# Patient Record
Sex: Male | Born: 1970 | Race: Black or African American | Hispanic: No | Marital: Married
Health system: Southern US, Academic
[De-identification: ages and names within clinical notes are randomized; demographics above are authoritative.]

## PROBLEM LIST (undated history)

## (undated) ENCOUNTER — Encounter

## (undated) ENCOUNTER — Ambulatory Visit: Payer: MEDICARE

## (undated) ENCOUNTER — Ambulatory Visit: Attending: Pharmacist | Primary: Pharmacist

## (undated) ENCOUNTER — Ambulatory Visit

## (undated) ENCOUNTER — Telehealth

## (undated) ENCOUNTER — Ambulatory Visit: Payer: MEDICARE | Attending: Registered" | Primary: Registered"

## (undated) ENCOUNTER — Ambulatory Visit: Payer: MEDICARE | Attending: Orthopaedic Surgery | Primary: Orthopaedic Surgery

## (undated) ENCOUNTER — Ambulatory Visit: Payer: MEDICARE | Attending: Foot and Ankle Surgery | Primary: Foot and Ankle Surgery

## (undated) ENCOUNTER — Ambulatory Visit
Payer: MEDICARE | Attending: Rehabilitative and Restorative Service Providers" | Primary: Rehabilitative and Restorative Service Providers"

## (undated) ENCOUNTER — Encounter: Attending: Internal Medicine | Primary: Internal Medicine

## (undated) ENCOUNTER — Encounter
Attending: Student in an Organized Health Care Education/Training Program | Primary: Student in an Organized Health Care Education/Training Program

## (undated) ENCOUNTER — Other Ambulatory Visit

## (undated) ENCOUNTER — Inpatient Hospital Stay

## (undated) ENCOUNTER — Encounter
Attending: Pharmacist Clinician (PhC)/ Clinical Pharmacy Specialist | Primary: Pharmacist Clinician (PhC)/ Clinical Pharmacy Specialist

## (undated) ENCOUNTER — Ambulatory Visit: Payer: MEDICAID

## (undated) ENCOUNTER — Ambulatory Visit: Payer: MEDICARE | Attending: Emergency Medicine | Primary: Emergency Medicine

## (undated) ENCOUNTER — Telehealth: Attending: Internal Medicine | Primary: Internal Medicine

## (undated) ENCOUNTER — Ambulatory Visit
Payer: MEDICARE | Attending: Student in an Organized Health Care Education/Training Program | Primary: Student in an Organized Health Care Education/Training Program

## (undated) ENCOUNTER — Ambulatory Visit: Attending: Registered" | Primary: Registered"

## (undated) ENCOUNTER — Telehealth
Attending: Student in an Organized Health Care Education/Training Program | Primary: Student in an Organized Health Care Education/Training Program

## (undated) ENCOUNTER — Telehealth: Attending: Primary Care | Primary: Primary Care

## (undated) ENCOUNTER — Inpatient Hospital Stay: Payer: MEDICARE

## (undated) ENCOUNTER — Ambulatory Visit: Payer: MEDICARE | Attending: Physical Medicine & Rehabilitation | Primary: Physical Medicine & Rehabilitation

## (undated) ENCOUNTER — Telehealth: Attending: Hospitalist | Primary: Hospitalist

## (undated) ENCOUNTER — Encounter: Attending: Clinical | Primary: Clinical

## (undated) ENCOUNTER — Encounter: Attending: Gastroenterology | Primary: Gastroenterology

## (undated) DIAGNOSIS — G8929 Other chronic pain: Secondary | ICD-10-CM

## (undated) DIAGNOSIS — IMO0001 Reserved for inherently not codable concepts without codable children: Secondary | ICD-10-CM

## (undated) DIAGNOSIS — M542 Cervicalgia: Secondary | ICD-10-CM

## (undated) DIAGNOSIS — R51 Headache: Secondary | ICD-10-CM

## (undated) DIAGNOSIS — S6292XA Unspecified fracture of left wrist and hand, initial encounter for closed fracture: Secondary | ICD-10-CM

## (undated) DIAGNOSIS — R0602 Shortness of breath: Secondary | ICD-10-CM

## (undated) DIAGNOSIS — M5412 Radiculopathy, cervical region: Secondary | ICD-10-CM

## (undated) DIAGNOSIS — I10 Essential (primary) hypertension: Secondary | ICD-10-CM

## (undated) DIAGNOSIS — K219 Gastro-esophageal reflux disease without esophagitis: Secondary | ICD-10-CM

## (undated) DIAGNOSIS — R109 Unspecified abdominal pain: Secondary | ICD-10-CM

## (undated) DIAGNOSIS — K589 Irritable bowel syndrome without diarrhea: Secondary | ICD-10-CM

## (undated) DIAGNOSIS — K5090 Crohn's disease, unspecified, without complications: Secondary | ICD-10-CM

## (undated) HISTORY — PX: COLONOSCOPY: SHX174

## (undated) HISTORY — PX: NEVUS EXCISION: SHX2090

## (undated) HISTORY — PX: COLON SURGERY: SHX602

## (undated) HISTORY — DX: Unspecified fracture of left hand, initial encounter for closed fracture: S62.92XA

## (undated) HISTORY — PX: BOWEL RESECTION: SHX1257

---
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
## 2004-01-18 DIAGNOSIS — R197 Diarrhea, unspecified: Secondary | ICD-10-CM | POA: Insufficient documentation

## 2004-12-31 DIAGNOSIS — R112 Nausea with vomiting, unspecified: Secondary | ICD-10-CM | POA: Insufficient documentation

## 2004-12-31 DIAGNOSIS — R110 Nausea: Secondary | ICD-10-CM | POA: Insufficient documentation

## 2006-07-20 ENCOUNTER — Emergency Department (HOSPITAL_COMMUNITY): Admission: EM | Admit: 2006-07-20 | Discharge: 2006-07-21 | Payer: Self-pay | Admitting: Emergency Medicine

## 2006-09-29 ENCOUNTER — Emergency Department (HOSPITAL_COMMUNITY): Admission: EM | Admit: 2006-09-29 | Discharge: 2006-09-29 | Payer: Self-pay | Admitting: Emergency Medicine

## 2007-04-23 ENCOUNTER — Emergency Department (HOSPITAL_COMMUNITY): Admission: EM | Admit: 2007-04-23 | Discharge: 2007-04-23 | Payer: Self-pay | Admitting: Family Medicine

## 2007-06-25 ENCOUNTER — Emergency Department (HOSPITAL_COMMUNITY): Admission: EM | Admit: 2007-06-25 | Discharge: 2007-06-26 | Payer: Self-pay | Admitting: Emergency Medicine

## 2007-08-31 ENCOUNTER — Inpatient Hospital Stay (HOSPITAL_COMMUNITY): Admission: EM | Admit: 2007-08-31 | Discharge: 2007-09-15 | Payer: Self-pay | Admitting: Emergency Medicine

## 2007-10-11 ENCOUNTER — Emergency Department (HOSPITAL_COMMUNITY): Admission: EM | Admit: 2007-10-11 | Discharge: 2007-10-12 | Payer: Self-pay | Admitting: Emergency Medicine

## 2007-11-05 ENCOUNTER — Emergency Department (HOSPITAL_COMMUNITY): Admission: EM | Admit: 2007-11-05 | Discharge: 2007-11-05 | Payer: Self-pay | Admitting: Emergency Medicine

## 2007-11-16 ENCOUNTER — Emergency Department (HOSPITAL_COMMUNITY): Admission: EM | Admit: 2007-11-16 | Discharge: 2007-11-16 | Payer: Self-pay | Admitting: Emergency Medicine

## 2008-01-04 ENCOUNTER — Emergency Department (HOSPITAL_COMMUNITY): Admission: EM | Admit: 2008-01-04 | Discharge: 2008-01-04 | Payer: Self-pay | Admitting: Emergency Medicine

## 2008-01-20 ENCOUNTER — Emergency Department (HOSPITAL_COMMUNITY): Admission: EM | Admit: 2008-01-20 | Discharge: 2008-01-20 | Payer: Self-pay | Admitting: Emergency Medicine

## 2008-02-05 DIAGNOSIS — Z8719 Personal history of other diseases of the digestive system: Secondary | ICD-10-CM | POA: Insufficient documentation

## 2008-09-10 ENCOUNTER — Emergency Department: Payer: Self-pay | Admitting: Internal Medicine

## 2008-11-17 ENCOUNTER — Emergency Department: Payer: Self-pay | Admitting: Unknown Physician Specialty

## 2008-11-20 ENCOUNTER — Emergency Department: Payer: Self-pay | Admitting: Emergency Medicine

## 2008-12-16 ENCOUNTER — Emergency Department: Payer: Self-pay | Admitting: Emergency Medicine

## 2009-03-25 DIAGNOSIS — F1911 Other psychoactive substance abuse, in remission: Secondary | ICD-10-CM | POA: Insufficient documentation

## 2009-03-25 DIAGNOSIS — F1920 Other psychoactive substance dependence, uncomplicated: Secondary | ICD-10-CM | POA: Insufficient documentation

## 2009-06-21 ENCOUNTER — Emergency Department: Payer: Self-pay | Admitting: Internal Medicine

## 2009-06-25 ENCOUNTER — Emergency Department: Payer: Self-pay | Admitting: Emergency Medicine

## 2009-08-08 ENCOUNTER — Emergency Department: Payer: Self-pay | Admitting: Emergency Medicine

## 2010-02-18 ENCOUNTER — Emergency Department: Payer: Self-pay | Admitting: Emergency Medicine

## 2010-02-27 ENCOUNTER — Ambulatory Visit: Payer: Self-pay | Admitting: Sports Medicine

## 2010-05-22 DIAGNOSIS — K269 Duodenal ulcer, unspecified as acute or chronic, without hemorrhage or perforation: Secondary | ICD-10-CM | POA: Insufficient documentation

## 2010-06-12 NOTE — Consult Note (Signed)
NAME:  Benjamin Murillo, Benjamin Murillo NO.:  [id number]   MEDICAL RECORD NO.:  [id number]          PATIENT TYPE:  EMS   LOCATION:  ED                           FACILITY:  [facility]   PHYSICIAN:  Stark Klein, MD       DATE OF BIRTH:  March 23, 1970   DATE OF CONSULTATION:  08/31/2007  DATE OF DISCHARGE:                                 CONSULTATION   REFERRING PHYSICIAN:  Dr. Tomi Bamberger, in the emergency department.   CHIEF COMPLAINT:  Left upper quadrant abscess.   HISTORY OF PRESENT ILLNESS:  Benjamin Murillo is a 40 year old male with a 17-  year history of Crohn's disease who was admitted to [facility] for  around a month, being discharged around 2 weeks ago.  At that time he  had an ileocecal resection complicated by an anastomotic leak requiring  multiple drains.  At this point, he is down to 1 drain, but presents  with 4-5 days of abdominal pain, anorexia and nausea.  He denies fevers  or chills.  He denies diarrhea or any other changes in his stool.  He  has been able to eat, although less in amount.  He states that when he  eats he feels like he is going to throw up but has not actually thrown  up.  He has not actually started gaining any weight back after surgery.   PAST MEDICAL HISTORY:  Significant for:  1. Crohn's.  2. Hypertension.   PAST SURGICAL HISTORY:  He had at least one ileocecal resection around a  month ago and a second surgery when he was younger.   MEDICATIONS:  He is currently taking:  1. Marinol.  2. OxyContin.  3. Metoclopramide.  4. Prednisone 5 mg.  5. Dilaudid orally.   ALLERGIES:  No known drug allergies.   SOCIAL HISTORY:  Cannabis use.  Current smoker.  Occasional alcohol use.   FAMILY HISTORY:  Diabetes and hypertension, no history of Crohn's.   REVIEW OF SYSTEMS:  Positive for bilateral knee and leg pain but  negative for any other systems x11 other than the HPI.   PHYSICAL EXAMINATION:  Temperature is 98.4 orally, pulse 53, blood  pressure is  126/83, respiratory rate 15, sats are 99% on room air.  He  is alert and oriented x3, no acute distress.  Normal mood and affect.  He is very thin-appearing.  He has multiple scars from IV lines.  SKIN:  There is no rashes seen.  HEART:  Regular.  ABDOMEN:  Soft, nondistended, tender in the right lower quadrant and  left upper quadrant.  There is no rebound or guarding.  EXTREMITIES:  Warm and well perfused with no pitting edema, negative  Homan's sign and palpable pulses bilaterally.   LABS:  UA is positive for a small amount of bilirubin and there is trace  of protein, otherwise negative.  White blood cell count is 14.8  thousand.  H and H 8.9 and 26.2, platelet count 504,000.  Electrolytes:  Sodium 131, potassium 3.4, chloride 99, CO2 26, BUN 7, creatinine 0.59  and glucose of  79, albumin  is 2.4, lipase 96.  Three-view of the  abdomen demonstrates new left pleural effusion.  There is also a drain  in the pelvis.  CT scan images are reviewed as well as the report.  This  is a CT scan of the abdomen and pelvis.  This was given with contrast.  There is a left upper quadrant fluid collection consistent with abscess.  There are 2 other probable abscesses that cannot be well delineated  because of enteric contrast.  There is a small left pleural effusion  that they think may be loculated.   ASSESSMENT:  Benjamin Murillo is a 40 year old male with a long history of  Crohn's disease.  1. Recurrent left upper quadrant abscess.  Given the location of      scarring on his abdomen, it appears there has been a drain in a      similar location.  This is likely recurrent.  This patient was      discussed with Dr. Barnabas Lister, at [facility],      who took care of him while he was there.  She reported that he had      Enterococcus and Escherichia coli in multiple or one of those      drains.  We will order a percutaneous drain for this left upper      quadrant fluid collection and  place him on Unasyn.  2. Immunosuppression.  He will continue his 5 mg prednisone.  3. Pain control.  He should continue OxyContin and oral Dilaudid.  4. We would recommend admitting him over night to a medicine team.  He      will likely be able to go home in 1 to 2 days if they are able to      drain the abscess and he continues to have good bowel function and      improved pain control.  He then should follow up with Dr. Launa Flight at      [facility]   I will follow him while he is an inpatient.  Thank you for this consult.      Stark Klein, MD     FB/MEDQ  D:  08/31/2007  T:  08/31/2007  Job:  [id number]

## 2010-06-12 NOTE — H&P (Signed)
NAME:  Benjamin Murillo, Benjamin Murillo NO.:  [id number]   MEDICAL RECORD NO.:  [id number]          PATIENT TYPE:  INP   LOCATION:  0106                         FACILITY:  [facility]   PHYSICIAN:  Helen Hashimoto, MD    DATE OF BIRTH:  02/19/1970   DATE OF ADMISSION:  08/30/2007  DATE OF DISCHARGE:                              HISTORY & PHYSICAL   PRIMARY PHYSICIAN:  None local and the patient is admitted as  unassigned.   CHIEF COMPLAINT:  Increasing abdominal pain more pronounced in the left  upper quadrant.   HISTORY OF PRESENT ILLNESS:  This is a 40 year old male with past  medical history of Crohn's disease and hypertension presented with the  above-mentioned complaint.  This patient has a recent prolonged  hospitalization at [facility] where he was admitted with  abdominal pain and his admission was complicated to the point that he  got ileocecal resection.  Following that he had leakage around his  anastomoses and that required multiple drains.  He has been at [facility] for 1  month and his drains has been cutting down and he got down to one drain.  He was discharged about 2 weeks ago and initially he was doing fine.  Around 4-5 days ago he started having some abdominal pain.  His pain was  described as sharp.  It is diffuse, but mainly in the right upper  quadrant.  It is on and off.  Initially it was responding to his p.o.  meds, but not anymore.  Associated with nausea.  He decided to come to  the ER today because he was not improving.  In the ER he was found to  have an abscess.  Surgical evaluation was requested and the patient was  seen by surgery as consult and the hospitalist service was admitted for  evaluation for admission.   PAST MEDICAL HISTORY:  Significant for:  1. Crohn disease.  2. Hypertension.   PAST SURGICAL HISTORY:  Ileocecal resection at [facility].   ALLERGIES:  NO KNOWN DRUG ALLERGIES.   CURRENT MEDICATIONS:  1.  OxyContin twice daily.  2. Metoclopramide 10 mg four times daily.  3. Prednisone 5 mg once a day.  4. Dilaudid as needed.   SOCIAL HISTORY:  The patient smokes around one pack per day.  He drinks  alcohol occasionally.  He used marijuana.  He denied other recreational  drugs.   FAMILY HISTORY:  Positive for hypertension and diabetes.  No one in his  family has history of Crohn's or other intestinal problems.   REVIEW OF SYSTEMS:  CONSTITUTIONAL:  Positive for fatigability.  There  is no fever.  EYES:  No blurred vision.  ENT:  No tinnitus.  RESPIRATORY:  No cough, no wheezes.  CARDIOVASCULAR:  No chest pain or  shortness of breath.  GI:  Positive for nausea, abdominal pain.  There  is no vomiting and there is no diarrhea.  GU:  No dysuria or hematuria.  No polyuria or nocturia.  HEMATOLOGY:  No bruises noted on IV.  No rash,  no  lesions.  NEURO:  No numbness or tingling.  The rest of systems  reviewed and they were negative.   PHYSICAL EXAMINATION:  VITAL SIGNS:  At 11:38 his temperature is 97.5,  blood pressure 133/80, pulse 58, respiratory rate 18.  GENERAL APPEARANCE:  This is a young male not in acute distress.  HEENT: Conjunctivae are pink.  His pupils are equal round and reactive  to light.  There is no ptosis.  Hearing is intact.  There is no ear  discharge or infection.  There is no nose infection or bleeding.  Oral  mucosa is dry.  No pharyngeal erythema.  NECK: Supple.  No JVD.  No carotid bruit.  No thyroid enlargement or  thyroid tenderness.  CARDIOVASCULAR EXAMINATION:  S1 and S2 regular.  There is no murmurs,  gallops and no lifts.  RESPIRATORY:  The patient is breathing between 16-18.  There is no  rales, rhonchi and no wheeze.  ABDOMEN EXAMINATION:  Soft, not distended.  He had tenderness,  especially in the right side, more pronounced in the right lower  quadrant.  Abdominal sounds present.  LOWER EXTREMITIES:  No edema, no rash and pulses are intact  bilaterally.   LAB RESULTS:  Urinalysis is negative.  WBC 14.8, hemoglobin 8.9  hematocrit 26.2, platelet count is 504.  Sodium 131, potassium 3.4,  chloride 99, bicarb 26 and glucose 79, BUN 7, creatinine 0.59.  Chest x-  ray showed new left pleural effusion associated with left basilar  atelectasis.  CT scan of the abdomen showed left upper quadrant fluid  collection consistent with abscess and two other smaller probable  infection collection and there is a small left pleural effusion.   IMPRESSION:  1. Abdominal wall abscess.  2. Mild hyponatremia.  3. Hypokalemia.  4. Leukocytosis.  5. Left pleural effusion.  6. Crohn disease.  7. Hypertension.   PLAN:  1. We will admit this patient to the floor.  2. We will keep him NPO for now.  3. We  will start him on normal saline at 100 mL an hour.  4. We will put on Dilaudid IV where I will start him on 1-2 mg IV      q.4h. as needed.  5. We will also start him on Unasyn 3 mg IV q.6h.  6. The patient has already seen by surgery and most likely he will      undergo percutaneous drainage of his abscess.  7. We will repeat his labs tomorrow.  8. I will follow with surgery and interventional radiology regarding      his management.   ASSESSMENT TIME:  50 minutes.      Helen Hashimoto, MD  Electronically Signed    NAE/MEDQ  D:  08/31/2007  T:  08/31/2007  Job:  [id number]

## 2010-06-15 NOTE — Discharge Summary (Signed)
NAME:  Benjamin Murillo, Benjamin Murillo NO.:  [id number]   MEDICAL RECORD NO.:  [id number]          PATIENT TYPE:  INP   LOCATION:  7654                         FACILITY:  [facility]   PHYSICIAN:  Barbette Merino, M.D.      DATE OF BIRTH:  11-May-1970   DATE OF ADMISSION:  08/30/2007  DATE OF DISCHARGE:  09/15/2007                               DISCHARGE SUMMARY   PRIMARY CARE PHYSICIAN:  [place].   DISCHARGE DIAGNOSES:  1. Intraabdominal abscess secondary to Crohn disease.  2. Crohn disease with abscess.  3. Chronic pain.  4. Persistent itching.  5. Hyponatremia.  6. Chronic anemia.  7. Hypertension.   DISCHARGE MEDICATIONS:  1. Dilaudid 2 mg p.o. every 6 hours p.r.n.  2. Marinol as needed.  3. Reglan 5 mg every 6 hours p.r.n.   DISPOSITION:  Patient was discharged to follow up with his surgeons at  [facility].  He has a drain in place and his intraabdominal abscess  has literally drained out.  The drain was not producing much and was  cleared by both surgery and interventional radiology.  Patient had a  drain that was placed at [facility] that was supposed to be followed up  at [facility].   PROCEDURES PERFORMED THIS ADMISSION:  Include:  1. Abdominal x-rays performed on August 31, 2007, that showed new left      pleural effusion with left basilar atelectasis and pigtail drainage      catheter in the anatomic pelvis.  2. CT abdomen and pelvis also on August 31, 2007, showed left upper      quadrant subdiaphragmatic fluid collection most consistent with      abscess.  Two other smaller probable infectious collections, likely      unopacified bowel within the anterior abdomen, small left pleural      effusion with probable component of loculation laterally.  There      was percutaneous drain in the pelvic cul-de-sac without residual      abscess.  3. Chest x-Ray on September 02, 2007, shows left lower lobe air space      disease which may represent pneumonia.  4. Repeat CT  abdomen and pelvis on September 03, 2007, showed interval      placement of left upper quadrant abscess drainage catheter with      partial decompression of the abscess cavity, some interval decrease      in size of small intraperitoneal fluid collection anterior to the      proximal transverse colon.  No new fluid collections identified.      Persistent moderate left and small right pleural effusion,      distended proximal small bowel suggestive, and possible mid to      distal partial small bowel obstruction.  There was stable right      transgluteal drainage catheter without any residual collection.  5. Another CT abdomen and pelvis on September 07, 2007, showed slight      further decrease in the size of the left upper quadrant abscess      with abscess drain  remaining in good position, stable small to      moderate left effusions, and stable position of the pelvic drain,      remained right transgluteal approach.  6. An abdominal x-ray on September 08, 2007, showed normal stool and      bowel gas pattern.  7. Another film on September 09, 2007, showed contrast remained      throughout the entire colon.  8. Another x-ray on September 10, 2007, also showed retained colonic      contrast.  9. Another abdominal x-ray on September 11, 2007, shows 2 percutaneous      drainage catheters remain and the contrast still in place.  10.Fluoro performed on September 04, 2007, with injection of his catheter      of both the pigtail catheters but no residual cavity around the      right pelvic drainage, no evidence of fistula to the right upper      abdominal abscess was found.   CONSULTATIONS:  1. General surgery with Dr. Lerry Liner.  2. Dr. Barbie Banner from interventional radiology.   BRIEF HISTORY AND PHYSICAL:  Please refer to dictated history and  physical on admission.  In short, however, patient is a 40 year old  gentleman that usually gets his care at [facility].  Patient came in  with increasing abdominal pain that  was more pronounced in the left  upper quadrant.  He has history of Crohn disease with prior abscess.  He  already has 1 pigtail catheter placed, 1 pelvic drain placed through the  right gluteal approach at [facility].  Initial evaluation in the ED  showed that patient has more abscess as indicated above in the CT scan.  He was subsequently admitted for further management.  Patient was  initially placed on antibiotics with IV vancomycin and Zosyn.  Interventional radiology was consulted, as well as general surgery.  A  2nd catheter was placed as indicated above and the patient had prolonged  course in the hospital mainly waiting for the abscess to drain.  He had  repeated abdominal CT scans as indicated above.  Following the progress  until full resolution of his abscess prior to discharge.  At discharge,  patient was asked to follow with his surgeon at [facility] for further  management.      Barbette Merino, M.D.  Electronically Signed     LG/MEDQ  D:  11/17/2007  T:  11/17/2007  Job:  [id number]

## 2010-07-19 IMAGING — CR DG CHEST 1V PORT
1 series · 1 of 1 positions shown · non-contrast
Comparison: 06/26/2007

CLINICAL DATA: PICC placement.

PORTABLE CHEST - 1 VIEW

[view not recorded]
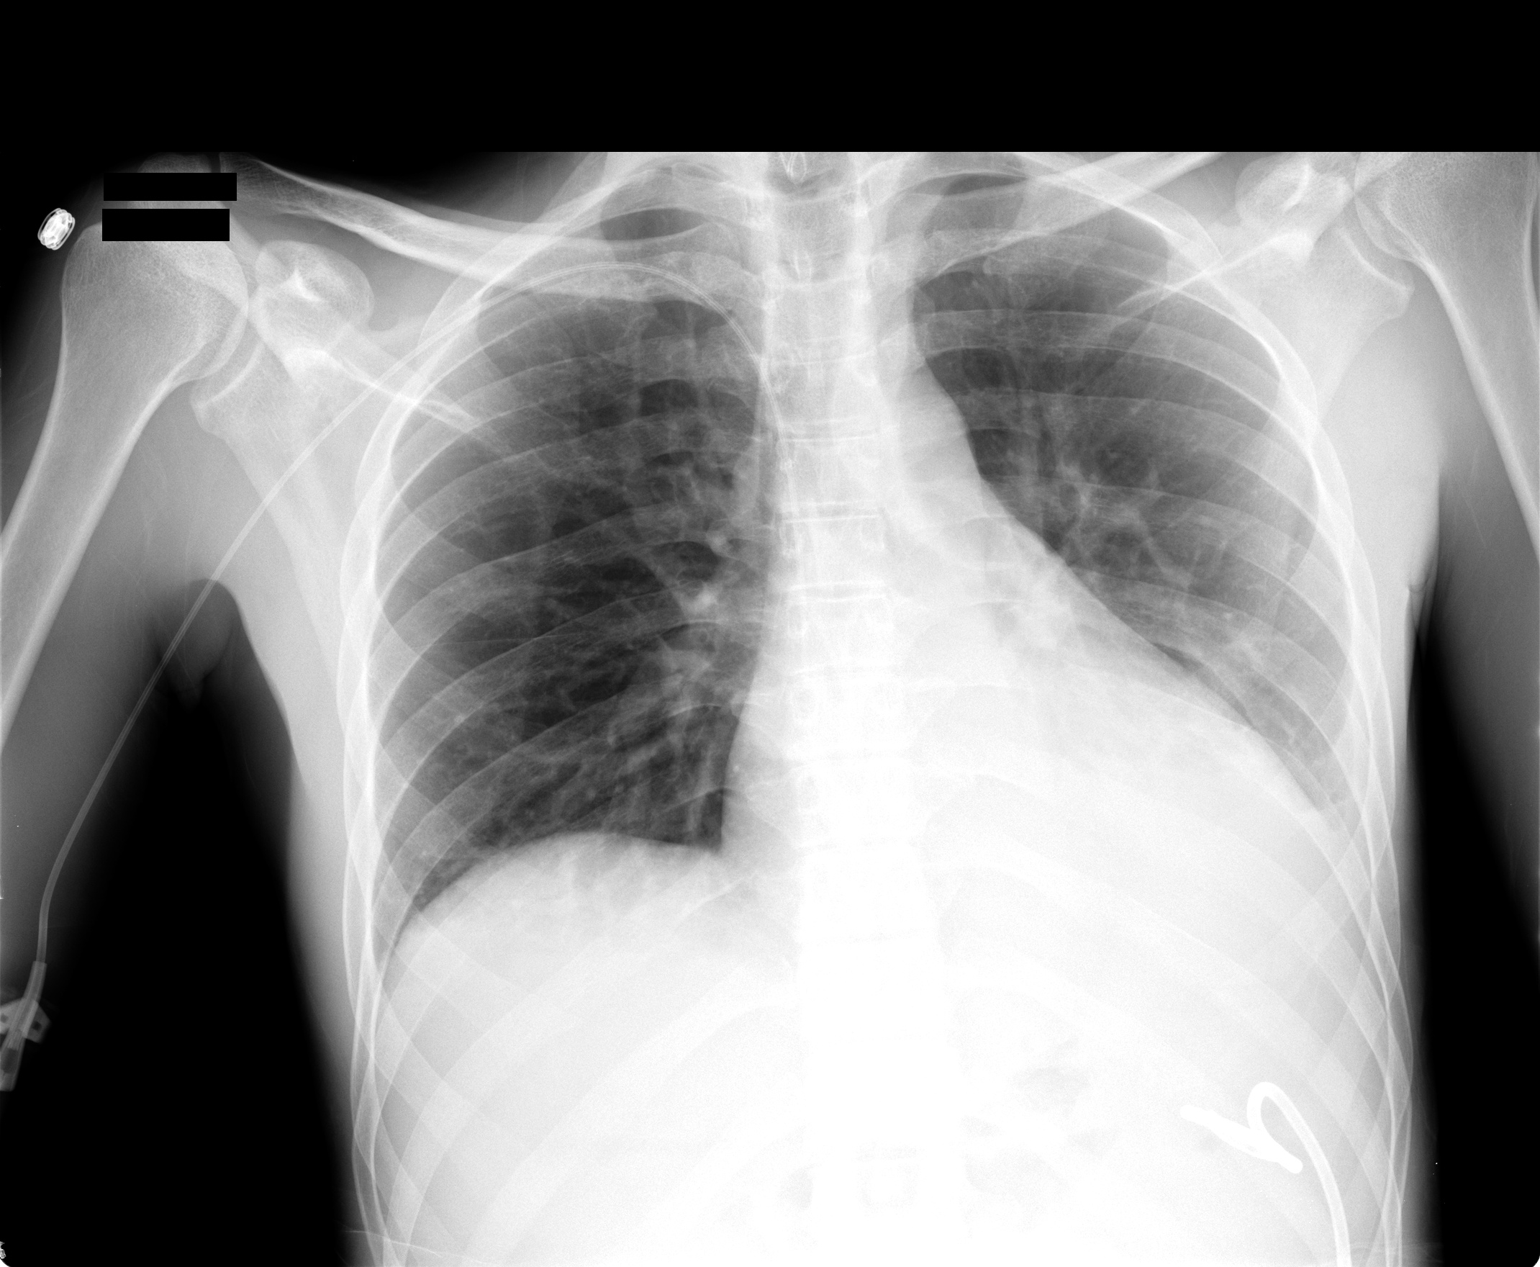

[1 of 1 positions shown; findings below may reference images not displayed]

FINDINGS: Right PICC projects over the distal SVC.  Heart size is
accentuated by technique and low lung volumes.  There is left lower
lobe air space disease and a left pleural effusion. The pigtail
catheter projects over the left upper quadrant.
IMPRESSION: Left lower lobe air space disease may represent pneumonia.  Left
pleural effusion.

## 2010-10-26 DIAGNOSIS — K639 Disease of intestine, unspecified: Secondary | ICD-10-CM | POA: Insufficient documentation

## 2010-10-26 DIAGNOSIS — Z980 Intestinal bypass and anastomosis status: Secondary | ICD-10-CM | POA: Insufficient documentation

## 2010-10-26 DIAGNOSIS — K319 Disease of stomach and duodenum, unspecified: Secondary | ICD-10-CM | POA: Insufficient documentation

## 2010-10-26 LAB — URINALYSIS, ROUTINE W REFLEX MICROSCOPIC
Glucose, UA: NEGATIVE
Hgb urine dipstick: NEGATIVE
Ketones, ur: NEGATIVE
Leukocytes, UA: NEGATIVE
Nitrite: NEGATIVE
Protein, ur: 30 — AB
Specific Gravity, Urine: 1.038 — ABNORMAL HIGH
Urobilinogen, UA: 0.2
pH: 6

## 2010-10-26 LAB — CBC
HCT: 25.3 — ABNORMAL LOW
HCT: 26.2 — ABNORMAL LOW
Hemoglobin: 10.3 — ABNORMAL LOW
Hemoglobin: 8.4 — ABNORMAL LOW
Hemoglobin: 8.5 — ABNORMAL LOW
Hemoglobin: 8.6 — ABNORMAL LOW
Hemoglobin: 8.8 — ABNORMAL LOW
Hemoglobin: 8.8 — ABNORMAL LOW
Hemoglobin: 8.9 — ABNORMAL LOW
MCHC: 33.4
MCHC: 33.8
MCHC: 34
MCHC: 34
MCHC: 34.5
MCHC: 34.6
MCV: 94.1
MCV: 94.2
MCV: 94.2
Platelets: 358
Platelets: 394
Platelets: 397
Platelets: 504 — ABNORMAL HIGH
RBC: 2.67 — ABNORMAL LOW
RBC: 2.69 — ABNORMAL LOW
RBC: 2.73 — ABNORMAL LOW
RBC: 2.78 — ABNORMAL LOW
RBC: 2.92 — ABNORMAL LOW
RBC: 3.15 — ABNORMAL LOW
RDW: 14.1
RDW: 14.4
RDW: 14.8
RDW: 14.9
RDW: 15.1
WBC: 12 — ABNORMAL HIGH
WBC: 14.7 — ABNORMAL HIGH
WBC: 14.8 — ABNORMAL HIGH

## 2010-10-26 LAB — COMPREHENSIVE METABOLIC PANEL WITH GFR
Albumin: 2.4 — ABNORMAL LOW
Alkaline Phosphatase: 81
BUN: 7
CO2: 26
Chloride: 99
GFR calc non Af Amer: >60
Glucose, Bld: 79
Potassium: 3.4 — ABNORMAL LOW
Total Bilirubin: 0.2 — ABNORMAL LOW

## 2010-10-26 LAB — BASIC METABOLIC PANEL
BUN: 1 — ABNORMAL LOW
BUN: 2 — ABNORMAL LOW
BUN: <1 — ABNORMAL LOW
CO2: 29
CO2: 30
CO2: 30
CO2: 30
CO2: 32
Calcium: 8 — ABNORMAL LOW
Calcium: 8.1 — ABNORMAL LOW
Calcium: 8.3 — ABNORMAL LOW
Calcium: 8.6
Calcium: 8.7
Calcium: 8.8
Chloride: 92 — ABNORMAL LOW
Chloride: 96
Creatinine, Ser: 0.74
Creatinine, Ser: 0.74
GFR calc Af Amer: >60
GFR calc Af Amer: >60
GFR calc Af Amer: >60
GFR calc Af Amer: >60
GFR calc Af Amer: >60
GFR calc non Af Amer: >60
GFR calc non Af Amer: >60
Glucose, Bld: 101 — ABNORMAL HIGH
Glucose, Bld: 80
Glucose, Bld: 89
Glucose, Bld: 90
Glucose, Bld: 92
Potassium: 3.6
Potassium: 4.1
Sodium: 129 — ABNORMAL LOW
Sodium: 131 — ABNORMAL LOW
Sodium: 131 — ABNORMAL LOW
Sodium: 131 — ABNORMAL LOW
Sodium: 134 — ABNORMAL LOW

## 2010-10-26 LAB — CULTURE, ROUTINE-ABSCESS

## 2010-10-26 LAB — LIPASE, BLOOD: Lipase: 96 — ABNORMAL HIGH

## 2010-10-26 LAB — COMPREHENSIVE METABOLIC PANEL
ALT: 15
ALT: 22
AST: 19
Alkaline Phosphatase: 69
CO2: 32
Calcium: 8.4
Chloride: 93 — ABNORMAL LOW
Creatinine, Ser: 0.59
GFR calc Af Amer: >60
GFR calc non Af Amer: >60
Glucose, Bld: 88
Potassium: 3.6
Sodium: 131 — ABNORMAL LOW
Sodium: 132 — ABNORMAL LOW
Total Bilirubin: 0.3
Total Protein: 6.7

## 2010-10-26 LAB — ANAEROBIC CULTURE

## 2010-10-26 LAB — CULTURE, BLOOD (ROUTINE X 2)
Culture: NO GROWTH
Culture: NO GROWTH

## 2010-10-26 LAB — URINE CULTURE
Colony Count: NO GROWTH
Culture: NO GROWTH

## 2010-10-26 LAB — DIFFERENTIAL
Basophils Absolute: 0
Basophils Relative: 0
Eosinophils Absolute: 0
Eosinophils Relative: 0
Lymphocytes Relative: 11 — ABNORMAL LOW
Lymphs Abs: 1.6
Monocytes Absolute: 1.2 — ABNORMAL HIGH
Monocytes Relative: 8
Neutro Abs: 11.9 — ABNORMAL HIGH
Neutrophils Relative %: 81 — ABNORMAL HIGH

## 2010-10-26 LAB — URINE MICROSCOPIC-ADD ON

## 2010-10-26 LAB — BODY FLUID CULTURE

## 2010-10-29 LAB — POCT I-STAT, CHEM 8
BUN: 5 — ABNORMAL LOW
Creatinine, Ser: 0.8
Hemoglobin: 12.6 — ABNORMAL LOW
Potassium: 3.9
Sodium: 136
TCO2: 24

## 2010-10-29 LAB — URINALYSIS, ROUTINE W REFLEX MICROSCOPIC
Glucose, UA: NEGATIVE
Hgb urine dipstick: NEGATIVE
Ketones, ur: NEGATIVE
Nitrite: NEGATIVE
Nitrite: NEGATIVE
Protein, ur: NEGATIVE
Protein, ur: NEGATIVE
Specific Gravity, Urine: 1.021
Urobilinogen, UA: 0.2
Urobilinogen, UA: 0.2

## 2010-10-29 LAB — CBC
HCT: 37.2 — ABNORMAL LOW
MCHC: 33.5
Platelets: 225
Platelets: 337
RBC: 3.67 — ABNORMAL LOW
RDW: 15.3
WBC: 12.1 — ABNORMAL HIGH
WBC: 8.3

## 2010-10-29 LAB — DIFFERENTIAL
Basophils Absolute: 0
Basophils Relative: 0
Eosinophils Absolute: 0.2
Eosinophils Relative: 1
Lymphocytes Relative: 9 — ABNORMAL LOW
Lymphs Abs: 0.7
Monocytes Relative: 6
Neutro Abs: 9.3 — ABNORMAL HIGH
Neutrophils Relative %: 77
Neutrophils Relative %: 81 — ABNORMAL HIGH

## 2010-10-29 LAB — BASIC METABOLIC PANEL
BUN: 4 — ABNORMAL LOW
CO2: 26
Calcium: 8.9
Creatinine, Ser: 0.75
GFR calc Af Amer: >60

## 2010-10-29 LAB — POCT CARDIAC MARKERS
CKMB, poc: <1 — ABNORMAL LOW
Myoglobin, poc: 106
Troponin i, poc: <0.05

## 2010-10-31 LAB — DIFFERENTIAL
Basophils Absolute: 0.1
Eosinophils Relative: 3
Lymphocytes Relative: 25
Neutro Abs: 5.6
Neutrophils Relative %: 63

## 2010-10-31 LAB — COMPREHENSIVE METABOLIC PANEL
BUN: 5 — ABNORMAL LOW
CO2: 28
Calcium: 9.4
Chloride: 100
Creatinine, Ser: 0.75
GFR calc non Af Amer: >60
Glucose, Bld: 94
Total Bilirubin: 0.5

## 2010-10-31 LAB — URINE MICROSCOPIC-ADD ON

## 2010-10-31 LAB — CBC
HCT: 31.9 — ABNORMAL LOW
MCHC: 33.5
MCV: 94.1
RBC: 3.39 — ABNORMAL LOW
WBC: 8.8

## 2010-10-31 LAB — URINALYSIS, ROUTINE W REFLEX MICROSCOPIC
Bilirubin Urine: NEGATIVE
Hgb urine dipstick: NEGATIVE
Nitrite: NEGATIVE
Protein, ur: 30 — AB
Specific Gravity, Urine: 1.026
Urobilinogen, UA: 0.2

## 2010-11-09 LAB — CBC
Hemoglobin: 13.6
MCV: 95.9
RBC: 4.17 — ABNORMAL LOW
WBC: 9.1

## 2010-11-09 LAB — BASIC METABOLIC PANEL
Chloride: 100
Creatinine, Ser: 0.93
GFR calc Af Amer: >60
Sodium: 131 — ABNORMAL LOW

## 2010-11-09 LAB — HEPATIC FUNCTION PANEL
AST: 25
Albumin: 3.9
Alkaline Phosphatase: 85
Total Bilirubin: 1

## 2010-11-09 LAB — DIFFERENTIAL
Eosinophils Absolute: 0.1
Lymphs Abs: 1.5
Monocytes Relative: 9
Neutro Abs: 6.7
Neutrophils Relative %: 73

## 2010-11-14 LAB — HEPATIC FUNCTION PANEL
ALT: 13
Alkaline Phosphatase: 88
Bilirubin, Direct: <0.1

## 2010-11-14 LAB — URINALYSIS, ROUTINE W REFLEX MICROSCOPIC
Bilirubin Urine: NEGATIVE
Nitrite: NEGATIVE
Specific Gravity, Urine: 1.019
pH: 7

## 2010-11-14 LAB — DIFFERENTIAL
Eosinophils Relative: 1
Lymphocytes Relative: 19
Lymphs Abs: 2.1
Monocytes Absolute: 0.7
Monocytes Relative: 7

## 2010-11-14 LAB — BASIC METABOLIC PANEL
GFR calc non Af Amer: >60
Glucose, Bld: 75
Potassium: 3.6
Sodium: 138

## 2010-11-14 LAB — CBC
HCT: 39.7
Hemoglobin: 13.6
WBC: 10.7 — ABNORMAL HIGH

## 2011-05-21 ENCOUNTER — Encounter (HOSPITAL_COMMUNITY): Payer: Self-pay | Admitting: *Deleted

## 2011-05-21 ENCOUNTER — Emergency Department (HOSPITAL_COMMUNITY)
Admission: EM | Admit: 2011-05-21 | Discharge: 2011-05-21 | Disposition: A | Discharge status: Home / Self Care | Payer: Medicare Other | Attending: Emergency Medicine | Admitting: Emergency Medicine

## 2011-05-21 ENCOUNTER — Emergency Department (HOSPITAL_COMMUNITY): Payer: Medicare Other

## 2011-05-21 DIAGNOSIS — K5090 Crohn's disease, unspecified, without complications: Secondary | ICD-10-CM | POA: Insufficient documentation

## 2011-05-21 DIAGNOSIS — R197 Diarrhea, unspecified: Secondary | ICD-10-CM | POA: Insufficient documentation

## 2011-05-21 DIAGNOSIS — R112 Nausea with vomiting, unspecified: Secondary | ICD-10-CM

## 2011-05-21 DIAGNOSIS — R6883 Chills (without fever): Secondary | ICD-10-CM | POA: Insufficient documentation

## 2011-05-21 DIAGNOSIS — R109 Unspecified abdominal pain: Secondary | ICD-10-CM | POA: Insufficient documentation

## 2011-05-21 DIAGNOSIS — R61 Generalized hyperhidrosis: Secondary | ICD-10-CM | POA: Insufficient documentation

## 2011-05-21 HISTORY — DX: Crohn's disease, unspecified, without complications: K50.90

## 2011-05-21 LAB — URINALYSIS, ROUTINE W REFLEX MICROSCOPIC
Bilirubin Urine: NEGATIVE
Glucose, UA: NEGATIVE mg/dL
Hgb urine dipstick: NEGATIVE
Ketones, ur: NEGATIVE mg/dL
Leukocytes, UA: NEGATIVE
Nitrite: NEGATIVE
Protein, ur: NEGATIVE mg/dL
Specific Gravity, Urine: 1.025 (ref 1.005–1.030)
Urobilinogen, UA: 1 mg/dL (ref 0.0–1.0)
pH: 6 (ref 5.0–8.0)

## 2011-05-21 LAB — DIFFERENTIAL
Basophils Absolute: 0 10*3/uL (ref 0.0–0.1)
Basophils Relative: 0 % (ref 0–1)
Eosinophils Absolute: 0.1 10*3/uL (ref 0.0–0.7)
Eosinophils Relative: 1 % (ref 0–5)
Lymphocytes Relative: 41 % (ref 12–46)
Lymphs Abs: 4.5 10*3/uL — ABNORMAL HIGH (ref 0.7–4.0)
Monocytes Absolute: 0.5 10*3/uL (ref 0.1–1.0)
Monocytes Relative: 5 % (ref 3–12)
Neutro Abs: 5.8 10*3/uL (ref 1.7–7.7)
Neutrophils Relative %: 53 % (ref 43–77)

## 2011-05-21 LAB — COMPREHENSIVE METABOLIC PANEL
ALT: 13 U/L (ref 0–53)
AST: 15 U/L (ref 0–37)
Albumin: 4.1 g/dL (ref 3.5–5.2)
Alkaline Phosphatase: 69 U/L (ref 39–117)
BUN: 11 mg/dL (ref 6–23)
CO2: 27 mEq/L (ref 19–32)
Calcium: 9.3 mg/dL (ref 8.4–10.5)
Chloride: 99 mEq/L (ref 96–112)
Creatinine, Ser: 0.96 mg/dL (ref 0.50–1.35)
GFR calc Af Amer: >90 mL/min (ref >90)
GFR calc non Af Amer: >90 mL/min (ref >90)
Glucose, Bld: 79 mg/dL (ref 70–99)
Potassium: 3.6 mEq/L (ref 3.5–5.1)
Sodium: 135 mEq/L (ref 135–145)
Total Bilirubin: 0.6 mg/dL (ref 0.3–1.2)
Total Protein: 7 g/dL (ref 6.0–8.3)

## 2011-05-21 LAB — CBC
HCT: 35.2 % — ABNORMAL LOW (ref 39.0–52.0)
Hemoglobin: 12.3 g/dL — ABNORMAL LOW (ref 13.0–17.0)
MCH: 34 pg (ref 26.0–34.0)
MCHC: 34.9 g/dL (ref 30.0–36.0)
MCV: 97.2 fL (ref 78.0–100.0)
Platelets: 229 10*3/uL (ref 150–400)
RBC: 3.62 MIL/uL — ABNORMAL LOW (ref 4.22–5.81)
RDW: 12.6 % (ref 11.5–15.5)
WBC: 10.9 10*3/uL — ABNORMAL HIGH (ref 4.0–10.5)

## 2011-05-21 LAB — LIPASE, BLOOD: Lipase: 33 U/L (ref 11–59)

## 2011-05-21 MED ORDER — HYDROMORPHONE HCL PF 2 MG/ML IJ SOLN
1.5000 mg | Freq: Once | INTRAMUSCULAR | Status: AC
  Administered 2011-05-21: 1.5 mg via INTRAVENOUS
  Filled 2011-05-21: qty 1

## 2011-05-21 MED ORDER — SODIUM CHLORIDE 0.9 % IV BOLUS (SEPSIS)
1000.0000 mL | Freq: Once | INTRAVENOUS | Status: AC
  Administered 2011-05-21: 1000 mL via INTRAVENOUS

## 2011-05-21 MED ORDER — PROMETHAZINE HCL 25 MG PO TABS: 25.0000 mg | ORAL_TABLET | Freq: Four times a day (QID) | ORAL | Status: DC | PRN

## 2011-05-21 MED ORDER — HYDROMORPHONE HCL PF 1 MG/ML IJ SOLN
1.0000 mg | Freq: Once | INTRAMUSCULAR | Status: AC
  Administered 2011-05-21: 1 mg via INTRAVENOUS
  Filled 2011-05-21: qty 1

## 2011-05-21 MED ORDER — IOHEXOL 300 MG/ML  SOLN
20.0000 mL | INTRAMUSCULAR | Status: AC
  Administered 2011-05-21: 20 mL via ORAL

## 2011-05-21 MED ORDER — ONDANSETRON HCL 4 MG/2ML IJ SOLN
4.0000 mg | Freq: Once | INTRAMUSCULAR | Status: AC
  Administered 2011-05-21: 4 mg via INTRAVENOUS
  Filled 2011-05-21: qty 2

## 2011-05-21 MED ORDER — HYDROCODONE-ACETAMINOPHEN 5-325 MG PO TABS: 1.0000 | ORAL_TABLET | ORAL | Status: AC | PRN

## 2011-05-21 MED ORDER — IOHEXOL 300 MG/ML  SOLN
100.0000 mL | Freq: Once | INTRAMUSCULAR | Status: AC | PRN
  Administered 2011-05-21: 100 mL via INTRAVENOUS

## 2011-05-21 NOTE — ED Notes (Signed)
The pt has had abd pain and nausea  With vomiting for 2 weeks.  He has a history of irritable bowel

## 2011-05-21 NOTE — ED Notes (Signed)
The pt has not finished drinking his oral contrast.  Requesting pain med again

## 2011-05-21 NOTE — Discharge Instructions (Signed)
You were seen and evaluated for your abdominal pains and nausea vomiting. Your lab tests and CAT scan of your abdomen have not shown any signs for concerning or emergent cause your symptoms. Please continue followup with your GI specialist for continued treatment of your Crohn's disease. Return to the emergency room for any worsening symptoms, fever, chills, sweats, persistent nausea vomiting.   Crohn's Disease Crohn's disease is a long-term (chronic) soreness and redness (inflammation) of the intestines (bowel). It can affect any portion of the digestive tract, from the mouth to the anus. It can also cause problems outside the digestive tract. Crohn's disease is closely related to a disease called ulcerative colitis (together, these two diseases are called inflammatory bowel disease).  CAUSES  The cause of Crohn's disease is not known. One Link Snuffer is that, in an easily affected (susceptible) person, the immune system is triggered to attack the body's own digestive tissue. Crohn's disease runs in families. It seems to be more common in certain geographic areas and amongst certain races. There are no clear-cut dietary causes.  SYMPTOMS  Crohn's disease can cause many different symptoms since it can affect many different parts of the body. Symptoms include:  Fatigue.   Weight loss.   Chronic diarrhea, sometime bloody.   Abdominal pain and cramps.   Fever.   Ulcers or canker sores in the mouth or rectum.   Anemia (low red blood cells).   Arthritis, skin problems, and eye problems may occur.  Complications of Crohn's disease can include:  Series of holes (perforation) of the bowel.   Portions of the intestines sticking to each other (adhesions).   Obstruction of the bowel.   Fistula formation, typically in the rectal area but also in other areas. A fistula is an opening between the bowels and the outside, or between the bowels and another organ.   A painful crack in the mucous membrane  of the anus (rectal fissure).  DIAGNOSIS  Your caregiver may suspect Crohn's disease based on your symptoms and an exam. Blood tests may confirm that there is a problem. You may be asked to submit a stool specimen for examination. X-rays and CT scans may be necessary. Ultimately, the diagnosis is usually made after a procedure that uses a flexible tube that is inserted via your mouth or your anus. This is done under sedation and is called either an upper endoscopy or colonoscopy. With these tests, the specialist can take tiny tissue samples and remove them from the inside of the bowel (biopsy). Examination of this biopsy tissue under a microscope can reveal Crohn's disease as the cause of your symptoms. Due to the many different forms that Crohn's disease can take, symptoms may be present for several years before a diagnosis is made. HOME CARE INSTRUCTIONS   There is no cure for Crohn's disease. The best treatment is frequent checkups with your caregiver.   Symptoms such as diarrhea can be controlled with medications. Avoid foods that have a laxative effect such as fresh fruit, vegetables and dairy products. During flare ups, you can rest your bowel by refraining from solid foods. Drink clear liquids frequently during the day (electrolyte or re-hydrating fluids are best. Your caregiver can help you with suggestions). Drink often to prevent loss of body fluids (dehydration). When diarrhea has cleared, eat small meals and more frequently. Avoid food additives and stimulants such as caffeine (coffee, tea, or chocolate). Enzyme supplements may help if you develop intolerance to a sugar in dairy products (lactose). Ask  your caregiver or dietitian about specific dietary instructions.   Try to maintain a positive attitude. Learn relaxation techniques such as self hypnosis, mental imaging, and muscle relaxation.   If possible, avoid stresses which can aggravate your condition.   Exercise regularly.   Follow  your diet.   Always get plenty of rest.  SEEK MEDICAL CARE IF:   Your symptoms fail to improve after a week or two of new treatment.   You experience continued weight loss.   You have ongoing crampy digestion or loose bowels.   You develop a new skin rash, skin sores, or eye problems.  SEEK IMMEDIATE MEDICAL CARE IF:   You have worsening of your symptoms or develop new symptoms.   You have a fever.   You develop bloody diarrhea.   You develop severe abdominal pain.  MAKE SURE YOU:   Understand these instructions.   Will watch your condition.   Will get help right away if you are not doing well or get worse.  Document Released: 10/24/2004 Document Revised: 01/03/2011 Document Reviewed: 09/22/2006 [facility] Patient Information 2012 [place].    RESOURCE GUIDE  Dental Problems  Patients with Medicaid: [place] Phone:  [phone]                                                  Phone:  [phone]  If unable to pay or uninsured, contact:  [facility] or [facility]. to become qualified for the adult dental clinic.  Chronic Pain Problems Contact [facility]  [phone] Patients need to be referred by their primary care doctor.  Insufficient Money for Medicine Contact [employer]:  call "211" or [place] [phone].  No Primary Care Doctor Call [facility]  [phone] Other agencies that provide inexpensive medical care    [place]  [phone]    [facility] Internal Medicine  [place]  [phone]    [facility]  [phone]    [facility]  [place]  [place]  [phone] [place]   [phone] (emergency services  [phone])  Substance Abuse [facility]  [phone] [facility] [phone] [facility] [place] [phone] [place]inita Pester [phone] Residential & Outpatient Substance Abuse Program  [phone]  Abuse/Neglect [place] [phone] [place] [phone] (After Hours)  Emergency [place] [phone]  [place] at the [place] [phone] [place] [phone]  MRSA Hotline #:   (760)549-1232    [facility] [place]  [facility] Dept. [address]      [place] Phone:  [phone]                                   Phone:  [phone]                 Phone:  [place] Phone:  [place] [phone] (After Hours)

## 2011-05-21 NOTE — ED Notes (Signed)
Hx of crohns disease

## 2011-05-21 NOTE — ED Provider Notes (Signed)
History     CSN: [id number]  Arrival date & time 05/21/11  1709   First MD Initiated Contact with Patient 05/21/11 2000      Chief Complaint  Patient presents with  . Abdominal Pain    HPI  History provided by the patient. Patient is a 41 year old male with history of Crohn's disease and prior our sections presents with complaints of uncontrolled abdominal pains for the past several days. Patient also reports increased amounts of nausea vomiting over the past 2 weeks. Patient reports currently being on prednisone daily as well as Purinethol prescribed by his GI specialist at [facility]. Patient also reports that he goes monthly for injections of some type of infusion to help with his condition. Patient states that despite this he has had difficulty controlling pains at home. She also has multiple episodes of loose watery stools a day. Patient states he feels he is not gaining any weight and has decreased appetite. Patient also has nightly sweats and chills. This does make him concerned for similar symptoms previously when he was diagnosed with multiple abscess infections. Patient denies having any fever. he denies any aggravating or alleviating factors. Patient has an upcoming appointment with his specialists on June 8. Patient states that he believes they may perform colonoscopy during this visit. Patient's last colonoscopy was 6 months ago. Patient last had a CT around that same time.     Past Medical History  Diagnosis Date  . Crohn disease     No past surgical history on file.  No family history on file.  History  Substance Use Topics  . Smoking status: Not on file  . Smokeless tobacco: Not on file  . Alcohol Use:       Review of Systems  Constitutional: Positive for chills, diaphoresis and appetite change. Negative for fever.  Respiratory: Negative for cough and shortness of breath.   Cardiovascular: Negative for chest pain.  Gastrointestinal: Positive for nausea, vomiting,  abdominal pain and diarrhea. Negative for constipation and blood in stool.  Skin: Negative for rash.    Allergies  Review of patient's allergies indicates no known allergies.  Home Medications   Current Outpatient Rx  Name Route Sig Dispense Refill  . MERCAPTOPURINE 50 MG PO TABS Oral Take 75 mg by mouth daily. Give on an empty stomach 1 hour before or 2 hours after meals. Caution: Chemotherapy.    Marland Kitchen PREDNISONE 10 MG PO TABS Oral Take 10 mg by mouth daily.      BP 116/75  Pulse 56  Temp(Src) 99.7 F (37.6 C) (Oral)  Resp 18  SpO2 98%  Physical Exam  Nursing note and vitals reviewed. Constitutional: He is oriented to person, place, and time. He appears well-developed and well-nourished. No distress.  HENT:  Head: Normocephalic and atraumatic.  Neck:       No meningeal signs  Cardiovascular: Normal rate and regular rhythm.   Pulmonary/Chest: Effort normal and breath sounds normal.  Abdominal: Soft. He exhibits no distension. Bowel sounds are increased. There is tenderness. There is no rebound and no guarding.       Diffuse tenderness to palpation. Lower midline surgical scar consistent with prior surgery.  Neurological: He is alert and oriented to person, place, and time.  Skin: Skin is warm. No rash noted.  Psychiatric: He has a normal mood and affect. His behavior is normal.    ED Course  Procedures   Results for orders placed during the hospital encounter of 05/21/11  URINALYSIS,  ROUTINE W REFLEX MICROSCOPIC      Component Value Range   Color, Urine YELLOW  YELLOW    APPearance CLEAR  CLEAR    Specific Gravity, Urine 1.025  1.005 - 1.030    pH 6.0  5.0 - 8.0    Glucose, UA NEGATIVE  NEGATIVE (mg/dL)   Hgb urine dipstick NEGATIVE  NEGATIVE    Bilirubin Urine NEGATIVE  NEGATIVE    Ketones, ur NEGATIVE  NEGATIVE (mg/dL)   Protein, ur NEGATIVE  NEGATIVE (mg/dL)   Urobilinogen, UA 1.0  0.0 - 1.0 (mg/dL)   Nitrite NEGATIVE  NEGATIVE    Leukocytes, UA NEGATIVE   NEGATIVE   CBC      Component Value Range   WBC 10.9 (*) 4.0 - 10.5 (K/uL)   RBC 3.62 (*) 4.22 - 5.81 (MIL/uL)   Hemoglobin 12.3 (*) 13.0 - 17.0 (g/dL)   HCT 35.2 (*) 39.0 - 52.0 (%)   MCV 97.2  78.0 - 100.0 (fL)   MCH 34.0  26.0 - 34.0 (pg)   MCHC 34.9  30.0 - 36.0 (g/dL)   RDW 12.6  11.5 - 15.5 (%)   Platelets 229  150 - 400 (K/uL)  DIFFERENTIAL      Component Value Range   Neutrophils Relative 53  43 - 77 (%)   Neutro Abs 5.8  1.7 - 7.7 (K/uL)   Lymphocytes Relative 41  12 - 46 (%)   Lymphs Abs 4.5 (*) 0.7 - 4.0 (K/uL)   Monocytes Relative 5  3 - 12 (%)   Monocytes Absolute 0.5  0.1 - 1.0 (K/uL)   Eosinophils Relative 1  0 - 5 (%)   Eosinophils Absolute 0.1  0.0 - 0.7 (K/uL)   Basophils Relative 0  0 - 1 (%)   Basophils Absolute 0.0  0.0 - 0.1 (K/uL)      Ct Abdomen Pelvis W Contrast  05/21/2011  *RADIOLOGY REPORT*  Clinical Data: Crohn disease.  To rule out abscess.  CT ABDOMEN AND PELVIS WITH CONTRAST  Technique:  Multidetector CT imaging of the abdomen and pelvis was performed following the standard protocol during bolus administration of intravenous contrast.  Contrast: 114m OMNIPAQUE IOHEXOL 300 MG/ML  SOLN  Comparison: 11/05/2007  Findings: Slight fibrosis in the lung bases.  Sub centimeter low attenuation lesion in the medial segment left lobe of the liver likely to represent cyst or hemangioma and unchanged since previous study.  Liver parenchymal pattern is otherwise unremarkable.  The gallbladder, pancreas, spleen, adrenal glands, kidneys, abdominal aorta, and retroperitoneal lymph nodes are unremarkable.  No free air or free fluid in the abdomen.  The gastric wall is not thickened.  Small bowel are not dilated and contrast flows through to the colon.  There is a suggestion of multi focal small bowel wall thickening but this may just be due to under distension.  No discrete stricture is definitely appreciated.  No obvious colonic wall thickening.  Pelvis:  Prostate gland is  not enlarged.  Bladder wall is not thickened.  No free or loculated pelvic fluid collections. Postoperative changes in the right colon.  Appendix is likely surgically absent.  No definite colonic wall thickening or infiltrative changes.  Normal alignment of the lumbar vertebrae.  Fluid collection seen previously adjacent to the tail the pancreas has resolved.  Otherwise, there is no significant change.  IMPRESSION: No evidence of abdominal or pelvic abscess.  Possible areas of wall thickening in the small bowel although this could just be due to  incomplete distension.  No definite inflammatory bowel thickening or stricture.  Original Report Authenticated By: Neale Burly, M.D.     1. Abdominal pain   2. Nausea & vomiting       MDM  8:05 PM patient seen and evaluated. Patient no acute distress.  Patient feeling much better after medications. CT scan unremarkable without concerning complications of known Crohn's disease. This time we'll discharge home and have patient followup with PCP his specialists. he agrees with this plan.      Martie Lee, [place] 05/22/11 [phone]

## 2011-05-23 NOTE — ED Provider Notes (Signed)
Medical screening examination/treatment/procedure(s) were performed by non-physician practitioner and as supervising physician I was immediately available for consultation/collaboration.  Virgel Manifold, MD 05/23/11 [phone]

## 2011-06-21 ENCOUNTER — Encounter (HOSPITAL_COMMUNITY): Payer: Self-pay | Admitting: *Deleted

## 2011-06-21 ENCOUNTER — Emergency Department (HOSPITAL_COMMUNITY)
Admission: EM | Admit: 2011-06-21 | Discharge: 2011-06-22 | Disposition: A | Discharge status: Home / Self Care | Payer: Medicare Other | Attending: Emergency Medicine | Admitting: Emergency Medicine

## 2011-06-21 DIAGNOSIS — L989 Disorder of the skin and subcutaneous tissue, unspecified: Secondary | ICD-10-CM | POA: Insufficient documentation

## 2011-06-21 NOTE — ED Notes (Signed)
The pt has had a lesion on her posterior chest for 7 months/.  painful

## 2011-06-22 NOTE — ED Notes (Signed)
Received pt. From triage, pt. Alert and oriented, ambulatory gait steady, NAD noted, pt. Pt. In gown

## 2011-06-22 NOTE — ED Notes (Signed)
Pt. Walked out after being informed by MD that he was going to order ibuprofen, NAD noted

## 2011-06-22 NOTE — ED Notes (Signed)
Right mid back lump noted, pt reports he has had it for years, but now he is having a lot of pain.

## 2011-06-22 NOTE — ED Notes (Signed)
MD at bedside. 

## 2011-06-25 ENCOUNTER — Encounter (HOSPITAL_COMMUNITY): Payer: Self-pay | Admitting: Emergency Medicine

## 2011-06-25 ENCOUNTER — Emergency Department (HOSPITAL_COMMUNITY): Payer: Medicare Other

## 2011-06-25 ENCOUNTER — Emergency Department (HOSPITAL_COMMUNITY)
Admission: EM | Admit: 2011-06-25 | Discharge: 2011-06-25 | Disposition: A | Discharge status: Home / Self Care | Payer: Medicare Other | Attending: Emergency Medicine | Admitting: Emergency Medicine

## 2011-06-25 DIAGNOSIS — R109 Unspecified abdominal pain: Secondary | ICD-10-CM | POA: Insufficient documentation

## 2011-06-25 DIAGNOSIS — K5090 Crohn's disease, unspecified, without complications: Secondary | ICD-10-CM | POA: Insufficient documentation

## 2011-06-25 DIAGNOSIS — R112 Nausea with vomiting, unspecified: Secondary | ICD-10-CM | POA: Insufficient documentation

## 2011-06-25 DIAGNOSIS — R197 Diarrhea, unspecified: Secondary | ICD-10-CM | POA: Insufficient documentation

## 2011-06-25 DIAGNOSIS — Z79899 Other long term (current) drug therapy: Secondary | ICD-10-CM | POA: Insufficient documentation

## 2011-06-25 LAB — COMPREHENSIVE METABOLIC PANEL
ALT: 15 U/L (ref 0–53)
AST: 23 U/L (ref 0–37)
Alkaline Phosphatase: 67 U/L (ref 39–117)
CO2: 24 mEq/L (ref 19–32)
GFR calc Af Amer: >90 mL/min (ref >90)
GFR calc non Af Amer: >90 mL/min (ref >90)
Glucose, Bld: 95 mg/dL (ref 70–99)
Potassium: 4.7 mEq/L (ref 3.5–5.1)
Sodium: 136 mEq/L (ref 135–145)

## 2011-06-25 LAB — CBC
Hemoglobin: 13.3 g/dL (ref 13.0–17.0)
Platelets: 261 10*3/uL (ref 150–400)
RBC: 3.85 MIL/uL — ABNORMAL LOW (ref 4.22–5.81)
WBC: 8.2 10*3/uL (ref 4.0–10.5)

## 2011-06-25 MED ORDER — HYDROMORPHONE HCL PF 1 MG/ML IJ SOLN
1.0000 mg | Freq: Once | INTRAMUSCULAR | Status: AC
  Administered 2011-06-25: 1 mg via INTRAVENOUS
  Filled 2011-06-25: qty 1

## 2011-06-25 MED ORDER — IOHEXOL 300 MG/ML  SOLN
20.0000 mL | INTRAMUSCULAR | Status: AC
  Administered 2011-06-25: 20 mL via ORAL

## 2011-06-25 MED ORDER — SODIUM CHLORIDE 0.9 % IV BOLUS (SEPSIS)
1000.0000 mL | Freq: Once | INTRAVENOUS | Status: AC
  Administered 2011-06-25: 1000 mL via INTRAVENOUS

## 2011-06-25 MED ORDER — ONDANSETRON HCL 4 MG/2ML IJ SOLN
4.0000 mg | Freq: Once | INTRAMUSCULAR | Status: AC
  Administered 2011-06-25: 4 mg via INTRAVENOUS
  Filled 2011-06-25: qty 2

## 2011-06-25 MED ORDER — IOHEXOL 300 MG/ML  SOLN
100.0000 mL | Freq: Once | INTRAMUSCULAR | Status: AC | PRN
  Administered 2011-06-25: 100 mL via INTRAVENOUS

## 2011-06-25 MED ORDER — OXYCODONE-ACETAMINOPHEN 5-325 MG PO TABS: 1.0000 | ORAL_TABLET | ORAL | Status: AC | PRN

## 2011-06-25 NOTE — ED Provider Notes (Signed)
Patient moved to CDU awaiting completion of diagnostic testing in the evaluation of abdominal pain.  History of crohn's disease, today's symptoms are similar to previous exacerbations.  Results reviewed:  No leukocytosis, mild chronic anemia, CT negative for acute process, abscess or bowel wall thickening.  Patient tolerating po's without difficulty.  Will discharge home with prescription for analgesia and recommend follow-up with his GI specialist.  Norman Herrlich, NP 06/25/11 2302

## 2011-06-25 NOTE — ED Notes (Signed)
States history of chron's currently having abdominal pain 10/10 middle upper sharp cramping.  Nausea vomiting and diarrhea.

## 2011-06-25 NOTE — ED Notes (Signed)
Pt continues to c/o pain requesting more pain meds.

## 2011-06-25 NOTE — Discharge Instructions (Signed)
Crohn's Disease Crohn's disease is a long-term (chronic) soreness and redness (inflammation) of the intestines (bowel). It can affect any portion of the digestive tract, from the mouth to the anus. It can also cause problems outside the digestive tract. Crohn's disease is closely related to a disease called ulcerative colitis (together, these two diseases are called inflammatory bowel disease).  CAUSES  The cause of Crohn's disease is not known. One Link Snuffer is that, in an easily affected (susceptible) person, the immune system is triggered to attack the body's own digestive tissue. Crohn's disease runs in families. It seems to be more common in certain geographic areas and amongst certain races. There are no clear-cut dietary causes.  SYMPTOMS  Crohn's disease can cause many different symptoms since it can affect many different parts of the body. Symptoms include:  Fatigue.   Weight loss.   Chronic diarrhea, sometime bloody.   Abdominal pain and cramps.   Fever.   Ulcers or canker sores in the mouth or rectum.   Anemia (low red blood cells).   Arthritis, skin problems, and eye problems may occur.  Complications of Crohn's disease can include:  Series of holes (perforation) of the bowel.   Portions of the intestines sticking to each other (adhesions).   Obstruction of the bowel.   Fistula formation, typically in the rectal area but also in other areas. A fistula is an opening between the bowels and the outside, or between the bowels and another organ.   A painful crack in the mucous membrane of the anus (rectal fissure).  DIAGNOSIS  Your caregiver may suspect Crohn's disease based on your symptoms and an exam. Blood tests may confirm that there is a problem. You may be asked to submit a stool specimen for examination. X-rays and CT scans may be necessary. Ultimately, the diagnosis is usually made after a procedure that uses a flexible tube that is inserted via your mouth or your anus.  This is done under sedation and is called either an upper endoscopy or colonoscopy. With these tests, the specialist can take tiny tissue samples and remove them from the inside of the bowel (biopsy). Examination of this biopsy tissue under a microscope can reveal Crohn's disease as the cause of your symptoms. Due to the many different forms that Crohn's disease can take, symptoms may be present for several years before a diagnosis is made. HOME CARE INSTRUCTIONS   There is no cure for Crohn's disease. The best treatment is frequent checkups with your caregiver.   Symptoms such as diarrhea can be controlled with medications. Avoid foods that have a laxative effect such as fresh fruit, vegetables and dairy products. During flare ups, you can rest your bowel by refraining from solid foods. Drink clear liquids frequently during the day (electrolyte or re-hydrating fluids are best. Your caregiver can help you with suggestions). Drink often to prevent loss of body fluids (dehydration). When diarrhea has cleared, eat small meals and more frequently. Avoid food additives and stimulants such as caffeine (coffee, tea, or chocolate). Enzyme supplements may help if you develop intolerance to a sugar in dairy products (lactose). Ask your caregiver or dietitian about specific dietary instructions.   Try to maintain a positive attitude. Learn relaxation techniques such as self hypnosis, mental imaging, and muscle relaxation.   If possible, avoid stresses which can aggravate your condition.   Exercise regularly.   Follow your diet.   Always get plenty of rest.  SEEK MEDICAL CARE IF:   Your symptoms  fail to improve after a week or two of new treatment.   You experience continued weight loss.   You have ongoing crampy digestion or loose bowels.   You develop a new skin rash, skin sores, or eye problems.  SEEK IMMEDIATE MEDICAL CARE IF:   You have worsening of your symptoms or develop new symptoms.   You  have a fever.   You develop bloody diarrhea.   You develop severe abdominal pain.  MAKE SURE YOU:   Understand these instructions.   Will watch your condition.   Will get help right away if you are not doing well or get worse.  Document Released: 10/24/2004 Document Revised: 01/03/2011 Document Reviewed: 09/22/2006 [facility] Patient Information 2012 [employer].

## 2011-06-25 NOTE — ED Provider Notes (Signed)
History  Scribed for Mirna Mires, MD, the patient was seen in room STRE1/STRE1. This chart was scribed by Truddie Coco. The patient's care started at 5:49 PM    CSN: [id number]  Arrival date & time 06/25/11  1612   None     Chief Complaint  Patient presents with  . Abdominal Pain     The history is provided by the patient.   Benjamin Murillo is a 41 y.o. male who presents to the Emergency Department complaining of abdominal pain, diarrhea, and vomiting associated with Chron's disease.  Pt called his PCP who is currently out of town.  Pt denies blood in stool, chest pain, or SOB.  Pt usually takes percocet for the pain and is currently is out.  Pt indications prior partial sb resection due to crohns. Denies any recent change in crohns meds, but states is out of percocet. No fever or chills. Does c/o nvd. Diarrhea not bloody. Emesis c/w recently ingested food and liquids, not bloody or bilious. Crampy, dull mid abd pain, constant, no specific exacerbating or alleviating factors. No hx pud, no hx pancreatitis or gallstones. No back or flank pain. No gu c/o. No known bad food ingestion or ill contacts. No recent abx.   Past Medical History  Diagnosis Date  . Crohn disease     History reviewed. No pertinent past surgical history.  No family history on file.  History  Substance Use Topics  . Smoking status: Current Everyday Smoker  . Smokeless tobacco: Not on file  . Alcohol Use: No      Review of Systems  Constitutional: Negative for fever.  HENT: Negative for neck pain.   Eyes: Negative for redness.  Respiratory: Negative for cough and shortness of breath.   Cardiovascular: Negative for chest pain.  Gastrointestinal: Positive for nausea, vomiting and diarrhea. Negative for abdominal pain and blood in stool.  Genitourinary: Negative for dysuria and flank pain.  Musculoskeletal: Negative for back pain.  Skin: Negative for rash.  Neurological: Negative for headaches.    Hematological: Does not bruise/bleed easily.  Psychiatric/Behavioral: Negative for confusion.    Allergies  Review of patient's allergies indicates no known allergies.  Home Medications   Current Outpatient Rx  Name Route Sig Dispense Refill  . MERCAPTOPURINE 50 MG PO TABS Oral Take 75 mg by mouth daily. Give on an empty stomach 1 hour before or 2 hours after meals. Caution: Chemotherapy.    Marland Kitchen PREDNISONE 10 MG PO TABS Oral Take 10 mg by mouth daily.    Marland Kitchen PROMETHAZINE HCL 25 MG PO TABS Oral Take 25 mg by mouth every 6 (six) hours as needed. For nausea      BP 142/89  Pulse 73  Temp(Src) 98.1 F (36.7 C) (Oral)  Resp 16  SpO2 100%  Physical Exam  Nursing note and vitals reviewed. Constitutional: He is oriented to person, place, and time. He appears well-developed and well-nourished. No distress.  HENT:  Head: Normocephalic and atraumatic.  Nose: Nose normal.  Mouth/Throat: Oropharynx is clear and moist.  Eyes: EOM are normal. Right eye exhibits no discharge. Left eye exhibits no discharge.  Neck: Neck supple. No tracheal deviation present.  Cardiovascular: Normal rate, regular rhythm, normal heart sounds and intact distal pulses.  Exam reveals no gallop and no friction rub.   No murmur heard. Pulmonary/Chest: Effort normal and breath sounds normal. No respiratory distress.  Abdominal: Soft. Bowel sounds are normal. He exhibits no mass. There is tenderness (diffuse). There  is no rebound and no guarding.       Mid abd tenderness, no rebound or guarding. No hernia.   Genitourinary:       No cva tenderness  Musculoskeletal: He exhibits no edema and no tenderness.  Neurological: He is alert and oriented to person, place, and time.  Skin: Skin is warm and dry. He is not diaphoretic.  Psychiatric: He has a normal mood and affect. His behavior is normal. Judgment and thought content normal.    ED Course  Procedures  DIAGNOSTIC STUDIES: Oxygen Saturation is 100% on room air,  normal by my interpretation.    COORDINATION OF CARE: 5:25PM Ordered: CBC; Comprehensive metabolic panel     Labs Reviewed  CBC - Abnormal; Notable for the following:    RBC 3.85 (*)    HCT 36.5 (*)    MCH 34.5 (*)    MCHC 36.4 (*)    All other components within normal limits  COMPREHENSIVE METABOLIC PANEL   Results for orders placed during the hospital encounter of 06/25/11  CBC      Component Value Range   WBC 8.2  4.0 - 10.5 (K/uL)   RBC 3.85 (*) 4.22 - 5.81 (MIL/uL)   Hemoglobin 13.3  13.0 - 17.0 (g/dL)   HCT 36.5 (*) 39.0 - 52.0 (%)   MCV 94.8  78.0 - 100.0 (fL)   MCH 34.5 (*) 26.0 - 34.0 (pg)   MCHC 36.4 (*) 30.0 - 36.0 (g/dL)   RDW 12.6  11.5 - 15.5 (%)   Platelets 261  150 - 400 (K/uL)  COMPREHENSIVE METABOLIC PANEL      Component Value Range   Sodium 136  135 - 145 (mEq/L)   Potassium 4.7  3.5 - 5.1 (mEq/L)   Chloride 100  96 - 112 (mEq/L)   CO2 24  19 - 32 (mEq/L)   Glucose, Bld 95  70 - 99 (mg/dL)   BUN 13  6 - 23 (mg/dL)   Creatinine, Ser 0.89  0.50 - 1.35 (mg/dL)   Calcium 9.9  8.4 - 10.5 (mg/dL)   Total Protein 7.5  6.0 - 8.3 (g/dL)   Albumin 4.4  3.5 - 5.2 (g/dL)   AST 23  0 - 37 (U/L)   ALT 15  0 - 53 (U/L)   Alkaline Phosphatase 67  39 - 117 (U/L)   Total Bilirubin 0.8  0.3 - 1.2 (mg/dL)   GFR calc non Af Amer >90  >90 (mL/min)   GFR calc Af Amer >90  >90 (mL/min)  LIPASE, BLOOD      Component Value Range   Lipase 42  11 - 59 (U/L)      MDM  I personally performed the services described in this documentation, which was scribed in my presence. The recorded information has been reviewed and considered. Mirna Mires, MD   Iv ns. Dilaudid 1 mg iv. zofran iv.  Reviewed nursing notes and prior charts for additional history.     Persistent pain and tenderness, will get ct. Move to cdu for ivf, pain rx and waiting for ct.   Discussed pt with cdu np smith  - follow up on ct results. If positive for acute process and/or crohns acute process -  discuss w pts gi md. If neg/normal, may d/c with pcp/gi follow up as outpt.      Mirna Mires, MD 06/25/11 2036

## 2011-06-25 NOTE — ED Notes (Signed)
Pt requesting more pain meds.  Also requesting something to eat. Etta Quill NP made aware

## 2011-06-27 NOTE — ED Provider Notes (Signed)
Medical screening examination/treatment/procedure(s) were conducted as a shared visit with non-physician practitioner(s) and myself.  I personally evaluated the patient during the encounter  Mirna Mires, MD 06/27/11 1022

## 2011-07-17 ENCOUNTER — Encounter (HOSPITAL_COMMUNITY): Payer: Self-pay | Admitting: *Deleted

## 2011-07-17 ENCOUNTER — Emergency Department (HOSPITAL_COMMUNITY): Payer: Medicare Other

## 2011-07-17 ENCOUNTER — Observation Stay (HOSPITAL_COMMUNITY)
Admission: EM | Admit: 2011-07-17 | Discharge: 2011-07-19 | Disposition: A | Discharge status: Home / Self Care | Payer: Medicare Other | Attending: Family Medicine | Admitting: Family Medicine

## 2011-07-17 DIAGNOSIS — K5090 Crohn's disease, unspecified, without complications: Secondary | ICD-10-CM

## 2011-07-17 DIAGNOSIS — Z23 Encounter for immunization: Secondary | ICD-10-CM | POA: Insufficient documentation

## 2011-07-17 DIAGNOSIS — K560 Paralytic ileus: Principal | ICD-10-CM | POA: Insufficient documentation

## 2011-07-17 DIAGNOSIS — R197 Diarrhea, unspecified: Secondary | ICD-10-CM

## 2011-07-17 DIAGNOSIS — R109 Unspecified abdominal pain: Secondary | ICD-10-CM

## 2011-07-17 DIAGNOSIS — E860 Dehydration: Secondary | ICD-10-CM | POA: Insufficient documentation

## 2011-07-17 LAB — COMPREHENSIVE METABOLIC PANEL
Alkaline Phosphatase: 62 U/L (ref 39–117)
BUN: 9 mg/dL (ref 6–23)
CO2: 25 mEq/L (ref 19–32)
Chloride: 99 mEq/L (ref 96–112)
Creatinine, Ser: 0.91 mg/dL (ref 0.50–1.35)
GFR calc non Af Amer: >90 mL/min (ref >90)
Glucose, Bld: 108 mg/dL — ABNORMAL HIGH (ref 70–99)
Potassium: 3.4 mEq/L — ABNORMAL LOW (ref 3.5–5.1)
Total Bilirubin: 0.3 mg/dL (ref 0.3–1.2)

## 2011-07-17 LAB — CBC
HCT: 32.8 % — ABNORMAL LOW (ref 39.0–52.0)
Hemoglobin: 11.9 g/dL — ABNORMAL LOW (ref 13.0–17.0)
MCH: 33.7 pg (ref 26.0–34.0)
MCV: 93.8 fL (ref 78.0–100.0)
MCV: 92.9 fL (ref 78.0–100.0)
Platelets: 225 10*3/uL (ref 150–400)
RBC: 3.72 MIL/uL — ABNORMAL LOW (ref 4.22–5.81)
RBC: 3.53 MIL/uL — ABNORMAL LOW (ref 4.22–5.81)
RDW: 12.3 % (ref 11.5–15.5)
WBC: 9 10*3/uL (ref 4.0–10.5)

## 2011-07-17 LAB — DIFFERENTIAL
Basophils Absolute: 0 10*3/uL (ref 0.0–0.1)
Eosinophils Absolute: 0.1 10*3/uL (ref 0.0–0.7)
Eosinophils Relative: 1 % (ref 0–5)
Lymphocytes Relative: 20 % (ref 12–46)
Lymphs Abs: 1.8 10*3/uL (ref 0.7–4.0)
Neutrophils Relative %: 75 % (ref 43–77)

## 2011-07-17 LAB — CREATININE, SERUM: Creatinine, Ser: 0.85 mg/dL (ref 0.50–1.35)

## 2011-07-17 MED ORDER — POTASSIUM CHLORIDE CRYS ER 20 MEQ PO TBCR
20.0000 meq | EXTENDED_RELEASE_TABLET | Freq: Three times a day (TID) | ORAL | Status: AC
  Administered 2011-07-18 (×2): 20 meq via ORAL
  Filled 2011-07-17 (×3): qty 1

## 2011-07-17 MED ORDER — MORPHINE SULFATE 2 MG/ML IJ SOLN
2.0000 mg | INTRAMUSCULAR | Status: DC | PRN
  Administered 2011-07-17 – 2011-07-18 (×4): 2 mg via INTRAVENOUS
  Filled 2011-07-17 (×4): qty 1

## 2011-07-17 MED ORDER — ZOLPIDEM TARTRATE 10 MG PO TABS
10.0000 mg | ORAL_TABLET | Freq: Every evening | ORAL | Status: DC | PRN
  Administered 2011-07-18: 10 mg via ORAL
  Filled 2011-07-17: qty 1

## 2011-07-17 MED ORDER — PREDNISONE 20 MG PO TABS
40.0000 mg | ORAL_TABLET | Freq: Every day | ORAL | Status: DC
  Administered 2011-07-18 – 2011-07-19 (×2): 40 mg via ORAL
  Filled 2011-07-17 (×4): qty 2

## 2011-07-17 MED ORDER — ACETAMINOPHEN 650 MG RE SUPP: 650.0000 mg | Freq: Four times a day (QID) | RECTAL | Status: DC | PRN

## 2011-07-17 MED ORDER — HEPARIN SODIUM (PORCINE) 5000 UNIT/ML IJ SOLN
5000.0000 [IU] | Freq: Three times a day (TID) | INTRAMUSCULAR | Status: DC
  Administered 2011-07-18 – 2011-07-19 (×3): 5000 [IU] via SUBCUTANEOUS
  Filled 2011-07-17 (×8): qty 1

## 2011-07-17 MED ORDER — MERCAPTOPURINE 50 MG PO TABS
75.0000 mg | ORAL_TABLET | Freq: Every day | ORAL | Status: DC
  Administered 2011-07-18 – 2011-07-19 (×2): 75 mg via ORAL
  Filled 2011-07-17 (×4): qty 2

## 2011-07-17 MED ORDER — ONDANSETRON HCL 4 MG/2ML IJ SOLN
4.0000 mg | Freq: Once | INTRAMUSCULAR | Status: AC
  Administered 2011-07-17: 4 mg via INTRAVENOUS
  Filled 2011-07-17: qty 2

## 2011-07-17 MED ORDER — NICOTINE 7 MG/24HR TD PT24
7.0000 mg | MEDICATED_PATCH | Freq: Every day | TRANSDERMAL | Status: DC | PRN
  Filled 2011-07-17: qty 1

## 2011-07-17 MED ORDER — ONDANSETRON HCL 4 MG PO TABS
4.0000 mg | ORAL_TABLET | Freq: Four times a day (QID) | ORAL | Status: DC | PRN
  Administered 2011-07-18: 4 mg via ORAL
  Filled 2011-07-17: qty 1

## 2011-07-17 MED ORDER — ONDANSETRON HCL 4 MG/2ML IJ SOLN
4.0000 mg | Freq: Four times a day (QID) | INTRAMUSCULAR | Status: DC | PRN
  Administered 2011-07-17 – 2011-07-19 (×6): 4 mg via INTRAVENOUS
  Filled 2011-07-17 (×6): qty 2

## 2011-07-17 MED ORDER — ACETAMINOPHEN 325 MG PO TABS: 650.0000 mg | ORAL_TABLET | Freq: Four times a day (QID) | ORAL | Status: DC | PRN

## 2011-07-17 MED ORDER — FENTANYL CITRATE 0.05 MG/ML IJ SOLN
100.0000 ug | Freq: Once | INTRAMUSCULAR | Status: AC
  Administered 2011-07-17: 100 ug via INTRAVENOUS
  Filled 2011-07-17: qty 2

## 2011-07-17 MED ORDER — POTASSIUM CHLORIDE IN NACL 20-0.9 MEQ/L-% IV SOLN
INTRAVENOUS | Status: DC
  Administered 2011-07-18 (×2): via INTRAVENOUS
  Filled 2011-07-17 (×4): qty 1000

## 2011-07-17 NOTE — ED Provider Notes (Signed)
History     CSN: [id number]  Arrival date & time 07/17/11  1346   First MD Initiated Contact with Patient 07/17/11 1807      Chief Complaint  Patient presents with  . Abdominal Pain  . Emesis  . Nausea    (Consider location/radiation/quality/duration/timing/severity/associated sxs/prior treatment) Patient is a 41 y.o. male presenting with abdominal pain and vomiting. The history is provided by the patient.  Abdominal Pain The primary symptoms of the illness include abdominal pain, fatigue, nausea, vomiting and diarrhea. The primary symptoms of the illness do not include fever or shortness of breath.  Additional symptoms associated with the illness include chills. Symptoms associated with the illness do not include back pain.  Emesis  Associated symptoms include abdominal pain, chills and diarrhea. Pertinent negatives include no fever and no headaches.   patient has Crohn's disease. States his been dealing with a flare for last month. States she's had nausea vomiting diarrhea. States she's had a little bit is blood in the stool. States his abdominal pain that is unrelieved with pain medications. No fevers. He states he has had some chills. He is managed in [place]. No chest pain. he states it feels like his typical Crohn's pain.  He states that he has been unable to eat.    Past Medical History  Diagnosis Date  . Crohn disease     Past Surgical History  Procedure Date  . Bowel resection     x2    No family history on file.  History  Substance Use Topics  . Smoking status: Current Everyday Smoker -- 0.1 packs/day    Types: Cigarettes  . Smokeless tobacco: Not on file  . Alcohol Use: No      Review of Systems  Constitutional: Positive for chills, appetite change and fatigue. Negative for fever.  HENT: Negative for neck stiffness.   Respiratory: Negative for shortness of breath.   Cardiovascular: Negative for chest pain.  Gastrointestinal: Positive for nausea,  vomiting, abdominal pain, diarrhea, blood in stool and anal bleeding.  Genitourinary: Negative for flank pain.  Musculoskeletal: Negative for back pain.  Neurological: Negative for headaches.    Allergies  Review of patient's allergies indicates no known allergies.  Home Medications   No current outpatient prescriptions on file.  BP 161/90  Pulse 65  Temp 98 F (36.7 C) (Oral)  Resp 18  Wt 116 lb 10 oz (52.9 kg)  SpO2 100%  Physical Exam  Nursing note and vitals reviewed. Constitutional: He is oriented to person, place, and time. He appears well-developed and well-nourished.  HENT:  Head: Normocephalic and atraumatic.  Eyes: EOM are normal. Pupils are equal, round, and reactive to light.  Neck: Normal range of motion. Neck supple.  Cardiovascular: Normal rate, regular rhythm and normal heart sounds.   No murmur heard. Pulmonary/Chest: Effort normal and breath sounds normal.  Abdominal: Soft. Bowel sounds are normal. He exhibits no distension and no mass. There is tenderness. There is no rebound and no guarding.       Mild diffuse tenderness without rebound or guarding.  Musculoskeletal: Normal range of motion. He exhibits no edema.  Neurological: He is alert and oriented to person, place, and time. No cranial nerve deficit.  Skin: Skin is warm and dry.  Psychiatric: He has a normal mood and affect.    ED Course  Procedures (including critical care time)  Labs Reviewed  COMPREHENSIVE METABOLIC PANEL - Abnormal; Notable for the following:    Potassium 3.4 (*)  Glucose, Bld 108 (*)     All other components within normal limits  CBC - Abnormal; Notable for the following:    RBC 3.72 (*)     Hemoglobin 12.4 (*)     HCT 34.9 (*)     All other components within normal limits  CBC - Abnormal; Notable for the following:    RBC 3.53 (*)     Hemoglobin 11.9 (*)     HCT 32.8 (*)     MCHC 36.3 (*)     All other components within normal limits  DIFFERENTIAL    SEDIMENTATION RATE  CREATININE, SERUM  C-REACTIVE PROTEIN  CBC  BASIC METABOLIC PANEL   Dg Abd Acute W/chest  07/17/2011  *RADIOLOGY REPORT*  Clinical Data: Abdominal pain, nausea and vomiting.  History of Crohn's disease.  ACUTE ABDOMEN SERIES (ABDOMEN 2 VIEW & CHEST 1 VIEW)  Comparison: Previous examinations.  Findings: Normal sized heart.  Clear lungs.  Right lower quadrant surgical staples.  Mildly prominent bowel loops in the right lower abdomen and pelvis with some air-fluid levels.  No free peritoneal air.  Minimal scoliosis.  IMPRESSION: Minimal ileus or partial obstruction involving bowel loops in the pelvis and right lower abdomen.  Original Report Authenticated By: Gerald Stabs, M.D.     1. Crohn disease       MDM  Patient with nausea vomiting diarrhea and abdominal pain. History Crohn's disease. He is a hypokalemia. X-ray shows ileus versus small bowel obstruction. I doubt complete bowel obstruction. He hasn't had 9 previous CT scans. He'll be admitted to [facility] for further evaluation.        Jasper Riling. Alvino Chapel, MD 07/18/11 [phone]

## 2011-07-17 NOTE — ED Notes (Signed)
Attempted to start an IV but unsuccessful.

## 2011-07-17 NOTE — ED Notes (Signed)
IV Team paged, IV team returned paged. Informed they will be here shortly to start IV. Patient informed.

## 2011-07-17 NOTE — Progress Notes (Signed)
[facility] Admission History and Physical  Patient name: Benjamin Murillo Medical record number: [id number] Date of birth: August 06, 1970 Age: 41 y.o. Gender: male  Primary Care Provider: Recently established with [facility]  Chief Complaint: abdominal pain History of Present Illness: Benjamin Murillo is a 41 y.o. year old male with Crohn's disease presenting with nausea, vomiting, abdominal pain and bloody diarrhea.  He states that he has had abdominal pain/nausea for the last month.  However, it has gotten worse over the last 3 days and started having bloody diarrhea yesterday. He usually has 13 bowel movements a day and thinks he may be having more recently. While he is able to tolerate liquids okay, he has been unable to eat solid food for the last 3 days secondary to nausea and vomiting.  He has diffuse abdominal pain, worse on the left, that is constant and sharp in nature.  He always has some diarrhea, but he has noticed some dark blood in it the last 2 days.  Last BM this morning. He has been taking 2-4 of his Percocets daily for the pain; he reports alleviation of pain with this medication.      He sees Dr. Roger Shelter at [facility] gastroenterology for his Crohns.  He was recently started on a prednisone taper (currently at 56m daily). He had been on a maximum dose of 40 mg about 2 months ago. He had been tried on a prednisone taper last year, but his had a flare and had to go back up on his dose.    He had a CT-abdomen about 3 weeks ago in the ED due to his abdominal pain. It did not show any worrisome findings, including abscess.   No fevers or chills at home, but has had night sweats for the last 3 months. He has been lightheaded and seeing spots in his vision today.     ED Course: Given fentanyl x1 and zofran x1.  Labs wnl.  Abdominal films showed ileus vs partial SBO.  In effort to avoid another CT scan, ED called for admission for observation.  There is no  problem list on file for this patient.  Past Medical History: Past Medical History  Diagnosis Date  . Crohn disease     Past Surgical History: Past Surgical History  Procedure Date  . Bowel resection     x2    Social History: History   Social History  . Marital Status: Single    Spouse Name: N/A    Number of Children: N/A  . Years of Education: N/A   Social History Main Topics  . Smoking status: Current Everyday Smoker -- 0.1 packs/day    Types: Cigarettes  . Smokeless tobacco: None  . Alcohol Use: No  . Drug Use: Yes    Special: Marijuana  . Sexually Active: None   Other Topics Concern  . None   Social History Narrative  . None    Family History: No family history on file.  Allergies: No Known Allergies  Current Facility-Administered Medications  Medication Dose Route Frequency Provider Last Rate Last Dose  . fentaNYL (SUBLIMAZE) injection 100 mcg  100 mcg Intravenous Once [employer] Pickering, MD   100 mcg at 07/17/11 2026  . ondansetron (ZOFRAN) injection 4 mg  4 mg Intravenous Once [employer] PAlvino Chapel MD   4 mg at 07/17/11 2026   Current Outpatient Prescriptions  Medication Sig Dispense Refill  . mercaptopurine (PURINETHOL) 50 MG tablet Take 75 mg  by mouth daily. Give on an empty stomach 1 hour before or 2 hours after meals. Caution: Chemotherapy.      . predniSONE (DELTASONE) 10 MG tablet Take 10 mg by mouth daily.      . promethazine (PHENERGAN) 25 MG tablet Take 25 mg by mouth every 6 (six) hours as needed. For nausea       Review Of Systems: Per HPI with the following additions: none Otherwise 12 point review of systems was performed and was unremarkable.  Physical Exam: Pulse: 79  Blood Pressure: 138/93 RR: 18   O2: 99 on RA Temp: 98.5  General: alert, cooperative, appears stated age and very thin HEENT: PERRLA, extra ocular movement intact, sclera clear, anicteric, oropharynx clear, no lesions and slightly dry mucous membranes Heart: S1, S2  normal, no murmur, rub or gallop, regular rate and rhythm Lungs: clear to auscultation, no wheezes or rales and unlabored breathing Abdomen: +BS, scaphoid, ND, diffusely TTP (worse on left side), no rebound or guarding Extremities: extremities normal, atraumatic, no cyanosis or edema Skin:no rashes, no ecchymoses Neurology: normal without focal findings, mental status, speech normal, alert and oriented x3, PERLA and cranial nerves 2-12 intact  Labs and Imaging:  Lab 07/17/11 1406  WBC 9.0  HGB 12.4*  HCT 34.9*  PLT 225    Lab 07/17/11 1406  NA 136  K 3.4*  CL 99  CO2 25  BUN 9  CREATININE 0.91  CALCIUM 9.2  PROT 6.9  BILITOT 0.3  ALKPHOS 62  ALT 12  AST 19  GLUCOSE 108*   KUB Minimal ileus or partial obstruction involving bowel loops in the  pelvis and right lower abdomen.  Assessment and Plan: Benjamin Murillo is a 41 y.o. year old male with Crohn's disease presenting with flare with ileus vs partial SBO.  # Ileus vs partial SBO: Likely related to crohn's flare with bloody BMs for the last 2 days.  No evidence for acute infection with a normal white count and no fevers.  Continues to have BMs. - monitor overnight - increase prednisone from 10 mg to 40 mg - ESR/CRP. Will get CT/start antibiotics if patient shows signs of infection.  - Will attempt to get in touch with his GI doctor at [facility] tomorrow morning - clear liquid diet - IVFs for hydration - can place NG if needed - zofran for nausea - morphine 65m IV q4hr prn  # Mild dehydration: Secondary to n/v. - IVF hydration as above  # Crohn's disease with flare: Diagnosed at age 41  [facility]  Currently on prednisone taper; on 146mdaily.  Night sweats likely related to flare as no evidence on recent CT for abscess or infection. - continue prednisone 1057maily - continue mercaptopurine 70m52mily - will call GI doctor in the morning  FEN/GI: clear liquid diet; NS+20KCl at  150cc/hr; Kdur 20mE36m x3 doses Prophylaxis: SQ heparin Disposition: admit for observation  BOOTH, ERIN [place]/2013, 8:29 PM   [place] PARK,[place]/2013, 9:24 PM  [phone]

## 2011-07-17 NOTE — ED Notes (Signed)
Patient reports he has been dealing with crohns flare up for 1 mth.  He has been seen for same a couple of times.  He states he continues to have loose stools with blood.  He is also having n/v.  Patient states he has abd pain and can't get any relief

## 2011-07-18 ENCOUNTER — Encounter (HOSPITAL_COMMUNITY): Payer: Self-pay | Admitting: *Deleted

## 2011-07-18 LAB — CBC
HCT: 30.8 % — ABNORMAL LOW (ref 39.0–52.0)
MCV: 93.9 fL (ref 78.0–100.0)
Platelets: 191 10*3/uL (ref 150–400)
RBC: 3.28 MIL/uL — ABNORMAL LOW (ref 4.22–5.81)
WBC: 7.2 10*3/uL (ref 4.0–10.5)

## 2011-07-18 LAB — BASIC METABOLIC PANEL
CO2: 25 mEq/L (ref 19–32)
Chloride: 101 mEq/L (ref 96–112)
Creatinine, Ser: 0.88 mg/dL (ref 0.50–1.35)

## 2011-07-18 MED ORDER — OXYCODONE-ACETAMINOPHEN 5-325 MG PO TABS
1.0000 | ORAL_TABLET | ORAL | Status: DC | PRN
  Administered 2011-07-18 – 2011-07-19 (×5): 2 via ORAL
  Filled 2011-07-18 (×4): qty 2

## 2011-07-18 MED ORDER — PNEUMOCOCCAL VAC POLYVALENT 25 MCG/0.5ML IJ INJ
0.5000 mL | INJECTION | INTRAMUSCULAR | Status: AC
  Administered 2011-07-19: 0.5 mL via INTRAMUSCULAR
  Filled 2011-07-18: qty 0.5

## 2011-07-18 MED ORDER — SODIUM CHLORIDE 0.9 % IV SOLN
5.0000 mg/kg | Freq: Once | INTRAVENOUS | Status: AC
  Administered 2011-07-18: 300 mg via INTRAVENOUS
  Filled 2011-07-18: qty 30

## 2011-07-18 MED ORDER — SODIUM CHLORIDE 0.9 % IV SOLN
INTRAVENOUS | Status: DC
  Administered 2011-07-18: 13:00:00 via INTRAVENOUS

## 2011-07-18 NOTE — Progress Notes (Signed)
Seen and examined.  Discussed with Dr. Verdie Drown.  Agree with her management. Briefly: 41 yo male with known Crohns presents with abd pain, diarrhea and wt loss (18 lbs in last 2 months.)  His Crohns is managed by GI in [facility].  They have been weaning his prednisone.  He describes his symptoms as typical of his Crohns.  Feels better this morning. I would try to touch base with GI docs in [facility].  If we cannot get in touch, I would treat as a flair of Crohns and bump his steroids.

## 2011-07-18 NOTE — Progress Notes (Signed)
Spoke with Dr. Alisia Ferrari at [facility].  He would like [place] to give the patient [place] 36m/kg over 2 hours as he missed his appointment for this infusion on Tuesday.  He has an appointment scheduled with them for 7/16, Dr. HAlisia Ferrariwill try and get him in sooner if possible.   BOOTH, Lissete Maestas 07/18/2011, 2:56 PM

## 2011-07-18 NOTE — Progress Notes (Signed)
[facility]MT[facility] Daily Intern Progress Note  Subjective: Pain a little better.  IV morphine helps.  No BM since yesterday morning.  Tolerating clears well, wants to advance diet.  Did not sleep last night.  I have reviewed the patient's medications.  Objective Temp:  [97 F (36.1 C)-98.5 F (36.9 C)] 97 F (36.1 C) (06/20 0543) Pulse Rate:  [49-79] 49  (06/20 0543) Resp:  [16-19] 16  (06/20 0543) BP: (127-161)/(79-93) 127/79 mmHg (06/20 0543) SpO2:  [99 %-100 %] 100 % (06/20 0543) Weight:  [116 lb 10 oz (52.9 kg)] 116 lb 10 oz (52.9 kg) (06/20 0122)   Intake/Output Summary (Last 24 hours) at 07/18/11 0925 Last data filed at 07/18/11 0800  Gross per 24 hour  Intake   1620 ml  Output   1250 ml  Net    370 ml    CBG (last 3)  No results found for this basename: GLUCAP:3 in the last 72 hours  General: alert, cooperative, NAD HEENT: AT/[place], sclera white, MMM CV: RRR, no murmurs Abd: scaphoid, +BS, soft, ND, tenderness improved, no rebound or guarding Ext: no edema Neuro: alert, moving all extremities  Labs and Imaging  Lab 07/18/11 0615 07/17/11 2210 07/17/11 1406  WBC 7.2 8.6 9.0  HGB 10.9* 11.9* 12.4*  HCT 30.8* 32.8* 34.9*  PLT 191 204 225     Lab 07/18/11 0615 07/17/11 2210 07/17/11 1406  NA 134* -- 136  K 3.6 -- 3.4*  CL 101 -- 99  CO2 25 -- 25  BUN 9 -- 9  CREATININE 0.88 0.85 0.91  LABGLOM -- -- --  GLUCOSE 63* -- --  CALCIUM 8.0* -- 9.2   ESR 7 CRP pending  Assessment and Plan Benjamin Murillo is a 41 y.o. year old male with Crohn's disease presenting with flare with ileus vs partial SBO.   # Ileus vs partial SBO: Likely related to crohn's flare with bloody BMs for the last 2 days. No evidence for acute infection with a normal white count and no fevers. Continues to have BMs.  - monitor overnight  - continue prednisone at 40 mg  - will get CT/start antibiotics if patient shows signs of infection.  - clear liquid diet, advance as tolerated - can place NG if  needed  - zofran for nausea  - morphine 89m IV q4hr prn  - will try oral pain meds this afternoon  # Mild dehydration: Secondary to n/v.  Resolved.  # Crohn's disease with flare: Diagnosed at age 89 [facility]. Currently on prednisone taper; on 138mdaily. Night sweats likely related to flare as no evidence on recent CT for abscess or infection.  - continue prednisone at 4094maily  - continue mercaptopurine 63m23mily  - will call GI doctor in the morning   FEN/GI: clear liquid diet, advance as tolerated; NS+20KCl at 75cc/hr; Kdur 20mE51m x3 doses  Prophylaxis: SQ heparin  Disposition: pending pain control  BOOTH, Katanya Schlie Pager: [phone]/2013, 9:25 AM

## 2011-07-18 NOTE — Progress Notes (Signed)
UR COMPLETED  

## 2011-07-18 NOTE — Progress Notes (Signed)
Pt states IV site is "sore" no reddness or swelling noted.  Small gauge IV .  Warm pack applied, and IV team called.  Pt has small veins, and difficult stick.

## 2011-07-19 LAB — BASIC METABOLIC PANEL
BUN: 7 mg/dL (ref 6–23)
CO2: 24 mEq/L (ref 19–32)
Chloride: 97 mEq/L (ref 96–112)
Creatinine, Ser: 0.92 mg/dL (ref 0.50–1.35)
GFR calc Af Amer: >90 mL/min (ref >90)
Glucose, Bld: 123 mg/dL — ABNORMAL HIGH (ref 70–99)

## 2011-07-19 LAB — CBC
HCT: 31.6 % — ABNORMAL LOW (ref 39.0–52.0)
MCV: 93.2 fL (ref 78.0–100.0)
RDW: 12 % (ref 11.5–15.5)
WBC: 10.2 10*3/uL (ref 4.0–10.5)

## 2011-07-19 MED ORDER — OXYCODONE-ACETAMINOPHEN 5-325 MG PO TABS: 1.0000 | ORAL_TABLET | ORAL | Status: DC | PRN

## 2011-07-19 MED ORDER — PREDNISONE 10 MG PO TABS: 40.0000 mg | ORAL_TABLET | Freq: Every day | ORAL | Status: DC

## 2011-07-19 MED ORDER — ONDANSETRON HCL 4 MG PO TABS: 4.0000 mg | ORAL_TABLET | Freq: Three times a day (TID) | ORAL | Status: DC | PRN

## 2011-07-19 MED ORDER — ONDANSETRON HCL 4 MG PO TABS: 4.0000 mg | ORAL_TABLET | Freq: Three times a day (TID) | ORAL | Status: AC | PRN

## 2011-07-19 NOTE — Discharge Summary (Signed)
Family Medicine Teaching Service  Discharge Note : Attending Traeh Milroy MD Pager [phone] Office [phone] I have seen and examined this patient, reviewed their chart and discussed discharge planning wit the resident at the time of discharge. I agree with the discharge plan as above.  

## 2011-07-19 NOTE — Discharge Summary (Signed)
Discharge Summary 07/19/2011 10:41 AM  [name] DOB: [birth year] MRN: [id number]  Date of Admission: 07/17/2011 Date of Discharge: 07/19/2011  PCP: [facility] Consultants: none  Reason for Admission: ileus vs partial SBO  Discharge Diagnosis Primary 1. Ileus secondary to Crohn's flare 2. Crohn's flare 3. Mild dehydration - resolved   Hospital Course: SHOLOM DULUDE is a 41 y.o. year old male with Crohn's disease presenting with flare with ileus vs partial SBO.   1. Ileus: Likely secondary to crohn's flare.  He was started on clear liquids and his diet was advanced as tolerated.  He received zofran and nausea and IV morphine for pain control (this was transitioned to percocet prior to discharge).  He was tolerating a full diet and passing flatus at time of discharge with much improvement in abdominal pain.  2. Crohn's flare: On going for the last 4-6 weeks.  Likely due to steroid taper.  Prednisone increased back to 11m daily.  Given normal CT scan 3 weeks ago and normal ESR/CRP, repeat CT abd/pelvis was not obtained. [facility] (516mkg).  This was given prior to discharge.  Abdominal pain was improved at discharge.   3. Mild dehydration: Secondary to poor PO intake.  Received IVF for hydration.   Discharge Exam: Temp:  [97.7 F (36.5 C)-98.2 F (36.8 C)] 97.7 F (36.5 C) (06/21 0600) Pulse Rate:  [56-77] 56  (06/21 0600) Resp:  [18] 18  (06/21 0600) BP: (134-150)/(82-84) 150/84 mmHg (06/21 0600) SpO2:  [99 %-100 %] 100 % (06/21 0600) Weight:  [119 lb 14.9 oz (54.4 kg)] 119 lb 14.9 oz (54.4 kg) (06/21 0600)  Gen: thin man, alert, NAD, cooperative HEENT; AT/[place], sclera white, MMM CV: RRR, no murmurs Pulm; CTAB Abd: +BS, scaphoid, soft, NDNT Ext: no edema, good pulses Neuro: alert, oriented, moving all extremities  A[facility]P [facility]  Procedures: Remicade  infusion  Discharge Medications Medication List  As of 07/19/2011 10:41 AM   START taking these medications         ondansetron 4 MG tablet   Commonly known as: ZOFRAN   Take 1 tablet (4 mg total) by mouth every 8 (eight) hours as needed for nausea.      oxyCODONE-acetaminophen 5-325 MG per tablet   Commonly known as: PERCOCET   Take 1-2 tablets by mouth every 4 (four) hours as needed.         CHANGE how you take these medications         predniSONE 10 MG tablet   Commonly known as: DELTASONE   Take 4 tablets (40 mg total) by mouth daily.   What changed: dose         CONTINUE taking these medications         mercaptopurine 50 MG tablet   Commonly known as: PURINETHOL      promethazine 25 MG tablet   Commonly known as: PHENERGAN          Where to get your medications    These are the prescriptions that you need to pick up.   You may get these medications from any pharmacy.         ondansetron 4 MG tablet   oxyCODONE-acetaminophen 5-325 MG per tablet   predniSONE 10 MG tablet            Pertinent Hospital Labs  Lab 07/19/11 [id number]  0615 07/17/11 2210  WBC 10.2 7.2 8.6  HGB 11.6* 10.9* 11.9*  HCT 31.6* 30.8* 32.8*  PLT 204 191 204    Lab 07/19/11 0835 07/18/11 0615 07/17/11 2210 07/17/11 1406  NA 132* 134* -- 136  K 3.4* 3.6 -- 3.4*  CL 97 101 -- 99  CO2 24 25 -- 25  BUN 7 9 -- 9  CREATININE 0.92 0.88 0.85 --  LABGLOM -- -- -- --  GLUCOSE 123* -- -- --  CALCIUM 8.7 8.0* -- 9.2   ESR: 7 CRP: 0.17  KUB: Minimal ileus or partial obstruction involving bowel loops in the  pelvis and right lower abdomen.  CT Abd/pelvis (06/25/11) No acute process demonstrated in the abdomen or pelvis. No abscess  or bowel wall thickening is appreciated.  Discharge instructions: see AVS  Condition at discharge: stable  Disposition: home  Pending Tests: none  Follow up: Follow-up Information    Follow up with Clementeen Graham, MD on 07/30/2011. (as  scheduled)    Contact information:   [facility] [phone]      Call [facility]. (for an appointment in then next 1-2 weeks)          Follow up Issues:  1. Please monitor for continued improvement in nausea, abdominal pain, bloody diarrhea.  BOOTH, Theadore Blunck 07/19/2011, 10:41 AM

## 2011-07-19 NOTE — Discharge Instructions (Signed)
Crohn's Disease Crohn's disease is a long-term (chronic) soreness and redness (inflammation) of the intestines (bowel). It can affect any portion of the digestive tract, from the mouth to the anus. It can also cause problems outside the digestive tract. Crohn's disease is closely related to a disease called ulcerative colitis (together, these two diseases are called inflammatory bowel disease).  CAUSES  The cause of Crohn's disease is not known. One Link Snuffer is that, in an easily affected (susceptible) person, the immune system is triggered to attack the body's own digestive tissue. Crohn's disease runs in families. It seems to be more common in certain geographic areas and amongst certain races. There are no clear-cut dietary causes.  SYMPTOMS  Crohn's disease can cause many different symptoms since it can affect many different parts of the body. Symptoms include:  Fatigue.   Weight loss.   Chronic diarrhea, sometime bloody.   Abdominal pain and cramps.   Fever.   Ulcers or canker sores in the mouth or rectum.   Anemia (low red blood cells).   Arthritis, skin problems, and eye problems may occur.  Complications of Crohn's disease can include:  Series of holes (perforation) of the bowel.   Portions of the intestines sticking to each other (adhesions).   Obstruction of the bowel.   Fistula formation, typically in the rectal area but also in other areas. A fistula is an opening between the bowels and the outside, or between the bowels and another organ.   A painful crack in the mucous membrane of the anus (rectal fissure).  DIAGNOSIS  Your caregiver may suspect Crohn's disease based on your symptoms and an exam. Blood tests may confirm that there is a problem. You may be asked to submit a stool specimen for examination. X-rays and CT scans may be necessary. Ultimately, the diagnosis is usually made after a procedure that uses a flexible tube that is inserted via your mouth or your anus.  This is done under sedation and is called either an upper endoscopy or colonoscopy. With these tests, the specialist can take tiny tissue samples and remove them from the inside of the bowel (biopsy). Examination of this biopsy tissue under a microscope can reveal Crohn's disease as the cause of your symptoms. Due to the many different forms that Crohn's disease can take, symptoms may be present for several years before a diagnosis is made. HOME CARE INSTRUCTIONS   There is no cure for Crohn's disease. The best treatment is frequent checkups with your caregiver.   Symptoms such as diarrhea can be controlled with medications. Avoid foods that have a laxative effect such as fresh fruit, vegetables and dairy products. During flare ups, you can rest your bowel by refraining from solid foods. Drink clear liquids frequently during the day (electrolyte or re-hydrating fluids are best. Your caregiver can help you with suggestions). Drink often to prevent loss of body fluids (dehydration). When diarrhea has cleared, eat small meals and more frequently. Avoid food additives and stimulants such as caffeine (coffee, tea, or chocolate). Enzyme supplements may help if you develop intolerance to a sugar in dairy products (lactose). Ask your caregiver or dietitian about specific dietary instructions.   Try to maintain a positive attitude. Learn relaxation techniques such as self hypnosis, mental imaging, and muscle relaxation.   If possible, avoid stresses which can aggravate your condition.   Exercise regularly.   Follow your diet.   Always get plenty of rest.  SEEK MEDICAL CARE IF:   Your symptoms  fail to improve after a week or two of new treatment.   You experience continued weight loss.   You have ongoing crampy digestion or loose bowels.   You develop a new skin rash, skin sores, or eye problems.  SEEK IMMEDIATE MEDICAL CARE IF:   You have worsening of your symptoms or develop new symptoms.   You  have a fever.   You develop bloody diarrhea.   You develop severe abdominal pain.  MAKE SURE YOU:   Understand these instructions.   Will watch your condition.   Will get help right away if you are not doing well or get worse.  Document Released: 10/24/2004 Document Revised: 01/03/2011 Document Reviewed: 09/22/2006 [facility] Patient Information 2012 [place].

## 2011-07-21 NOTE — Progress Notes (Signed)
FMTS Attending Daily Note: Sanford Lindblad MD [phone] pager office [phone] I  have seen and examined this patient, reviewed their chart. I have discussed this patient with the resident. I agree with the resident's findings, assessment and care plan. 

## 2011-07-24 ENCOUNTER — Emergency Department (HOSPITAL_COMMUNITY)
Admission: EM | Admit: 2011-07-24 | Discharge: 2011-07-25 | Disposition: A | Discharge status: Home / Self Care | Payer: Medicare Other | Attending: Emergency Medicine | Admitting: Emergency Medicine

## 2011-07-24 ENCOUNTER — Emergency Department (HOSPITAL_COMMUNITY): Payer: Medicare Other

## 2011-07-24 ENCOUNTER — Encounter (HOSPITAL_COMMUNITY): Payer: Self-pay | Admitting: Emergency Medicine

## 2011-07-24 DIAGNOSIS — F17200 Nicotine dependence, unspecified, uncomplicated: Secondary | ICD-10-CM | POA: Insufficient documentation

## 2011-07-24 DIAGNOSIS — R1032 Left lower quadrant pain: Secondary | ICD-10-CM | POA: Insufficient documentation

## 2011-07-24 DIAGNOSIS — K5090 Crohn's disease, unspecified, without complications: Secondary | ICD-10-CM | POA: Insufficient documentation

## 2011-07-24 DIAGNOSIS — R197 Diarrhea, unspecified: Secondary | ICD-10-CM | POA: Insufficient documentation

## 2011-07-24 DIAGNOSIS — R109 Unspecified abdominal pain: Secondary | ICD-10-CM

## 2011-07-24 DIAGNOSIS — I10 Essential (primary) hypertension: Secondary | ICD-10-CM | POA: Insufficient documentation

## 2011-07-24 HISTORY — DX: Essential (primary) hypertension: I10

## 2011-07-24 LAB — CBC
HCT: 35.6 % — ABNORMAL LOW (ref 39.0–52.0)
MCH: 33.4 pg (ref 26.0–34.0)
MCHC: 34.8 g/dL (ref 30.0–36.0)
MCV: 96 fL (ref 78.0–100.0)
RDW: 13.1 % (ref 11.5–15.5)

## 2011-07-24 LAB — COMPREHENSIVE METABOLIC PANEL
Albumin: 3.9 g/dL (ref 3.5–5.2)
BUN: 11 mg/dL (ref 6–23)
Calcium: 9.6 mg/dL (ref 8.4–10.5)
Creatinine, Ser: 1.03 mg/dL (ref 0.50–1.35)
GFR calc Af Amer: >90 mL/min (ref >90)
Total Protein: 7.1 g/dL (ref 6.0–8.3)

## 2011-07-24 MED ORDER — MORPHINE SULFATE 4 MG/ML IJ SOLN
6.0000 mg | Freq: Once | INTRAMUSCULAR | Status: AC
  Administered 2011-07-24: 6 mg via INTRAVENOUS
  Filled 2011-07-24 (×2): qty 1

## 2011-07-24 MED ORDER — SODIUM CHLORIDE 0.9 % IV BOLUS (SEPSIS)
1000.0000 mL | Freq: Once | INTRAVENOUS | Status: AC
  Administered 2011-07-24: 1000 mL via INTRAVENOUS

## 2011-07-24 MED ORDER — ONDANSETRON HCL 4 MG/2ML IJ SOLN
4.0000 mg | Freq: Once | INTRAMUSCULAR | Status: AC
Start: 2011-07-24 — End: 2011-07-24
  Administered 2011-07-24: 4 mg via INTRAVENOUS
  Filled 2011-07-24: qty 2

## 2011-07-24 MED ORDER — SODIUM CHLORIDE 0.9 % IV SOLN
1000.0000 mL | INTRAVENOUS | Status: DC
  Administered 2011-07-24: 1000 mL via INTRAVENOUS

## 2011-07-24 MED ORDER — MORPHINE SULFATE 4 MG/ML IJ SOLN
6.0000 mg | Freq: Once | INTRAMUSCULAR | Status: AC
  Administered 2011-07-24: 6 mg via INTRAVENOUS
  Filled 2011-07-24: qty 2

## 2011-07-24 MED ORDER — MORPHINE SULFATE 4 MG/ML IJ SOLN
4.0000 mg | Freq: Once | INTRAMUSCULAR | Status: AC
  Administered 2011-07-25: 4 mg via INTRAVENOUS
  Filled 2011-07-24: qty 1

## 2011-07-24 NOTE — ED Provider Notes (Signed)
41 year old male with Crohn's disease was recently admitted to the hospital and discharged 5 days ago. Since then, he is continued to have abdominal pain which has gotten worse. He has noted some blood in his stool. He has not been able to manage the pain at home. He is scheduled for a colonoscopy with his gastroenterologist at [facility] and this is scheduled for July 2. On exam, abdomen is soft but tender diffusely with bowel sounds decreased. He likely will need to be admitted for pain control. Abdominal x-rays do not show evidence of obstruction but WBC is up compared with his baseline.  [name], MD 01/41/59 7331

## 2011-07-24 NOTE — ED Notes (Signed)
LLQ pain since discharged from hospital on 6/21.  Reports small amount of rectal bleeding this morning.  States he was told on last admission that he had a small intestinal blockage.

## 2011-07-24 NOTE — ED Provider Notes (Signed)
I saw and evaluated the patient, reviewed the resident's note and I agree with the findings and plan.   [name], MD 08/17/69 1654

## 2011-07-24 NOTE — ED Provider Notes (Signed)
11pm  Report received from Dr. Ferdinand Lango patient with RLQ abdominal pain with recent discharge from hospital on 6/21 for intestinal blockage and ? chrons flare up.  Will give pain meds and IVF. 1150  Pt feels better and wants to go home.  Will keep follow up appointment at [facility] on July 2 or return if worse. No blood in stool tonight.  Labs Reviewed  CBC - Abnormal; Notable for the following:    WBC 15.1 (*)     RBC 3.71 (*)     Hemoglobin 12.4 (*)     HCT 35.6 (*)     All other components within normal limits  COMPREHENSIVE METABOLIC PANEL  OCCULT BLOOD, POC DEVICE    Julieta Bellini, NP 07/24/11 2356

## 2011-07-24 NOTE — ED Provider Notes (Signed)
History     CSN: [id number]  Arrival date & time 07/24/11  1553   First MD Initiated Contact with Patient 07/24/11 2020      Chief Complaint  Patient presents with  . Abdominal Pain    Patient is a 41 y.o. male presenting with abdominal pain. The history is provided by the patient and the spouse.  Abdominal Pain The primary symptoms of the illness include abdominal pain (LLQ) and nausea. The primary symptoms of the illness do not include fever, shortness of breath, vomiting, diarrhea, hematemesis, hematochezia or dysuria. The current episode started more than 2 days ago. The onset of the illness was gradual. The problem has not changed since onset. Associated with: History of Crohn's Disease; taking Mercaptopurine and Prednisone. Symptoms associated with the illness do not include chills or constipation.    Past Medical History  Diagnosis Date  . Crohn disease   . Hypertension     Past Surgical History  Procedure Date  . Bowel resection     x2  . Colon surgery     History reviewed. No pertinent family history.  History  Substance Use Topics  . Smoking status: Current Everyday Smoker -- 0.1 packs/day for 10 years    Types: Cigarettes  . Smokeless tobacco: Not on file  . Alcohol Use: No      Review of Systems  Constitutional: Negative for fever, chills, activity change and appetite change.  HENT: Negative for neck pain.   Respiratory: Negative for cough and shortness of breath.   Cardiovascular: Negative for chest pain.  Gastrointestinal: Positive for nausea and abdominal pain (LLQ). Negative for vomiting, diarrhea, constipation, hematochezia and hematemesis.  Genitourinary: Negative for dysuria and difficulty urinating.  Skin: Negative for rash and wound.  Neurological: Negative for syncope, weakness, light-headedness, numbness and headaches.  All other systems reviewed and are negative.    Allergies  Review of patient's allergies indicates no known  allergies.  Home Medications   Current Outpatient Rx  Name Route Sig Dispense Refill  . MERCAPTOPURINE 50 MG PO TABS Oral Take 75 mg by mouth daily. Give on an empty stomach 1 hour before or 2 hours after meals. Caution: Chemotherapy.    . ONDANSETRON HCL 4 MG PO TABS Oral Take 1 tablet (4 mg total) by mouth every 8 (eight) hours as needed for nausea. 20 tablet 0  . OXYCODONE-ACETAMINOPHEN 5-325 MG PO TABS Oral Take 1-2 tablets by mouth every 4 (four) hours as needed. For pain    . PREDNISONE 10 MG PO TABS Oral Take 4 tablets (40 mg total) by mouth daily. 60 tablet 0  . PROMETHAZINE HCL 25 MG PO TABS Oral Take 25 mg by mouth every 6 (six) hours as needed. For nausea      BP 174/99  Pulse 67  Temp 98.4 F (36.9 C) (Oral)  Resp 20  SpO2 100%  Physical Exam  Nursing note and vitals reviewed. Constitutional: He appears well-developed and well-nourished.  HENT:  Head: Normocephalic and atraumatic.  Right Ear: External ear normal.  Left Ear: External ear normal.  Nose: Nose normal.  Mouth/Throat: Oropharynx is clear and moist. No oropharyngeal exudate.  Eyes: Conjunctivae are normal. Pupils are equal, round, and reactive to light.  Neck: Normal range of motion. Neck supple.  Cardiovascular: Normal rate, regular rhythm, normal heart sounds and intact distal pulses.   Pulmonary/Chest: Effort normal and breath sounds normal. No respiratory distress. He has no wheezes. He has no rales. He exhibits no tenderness.  Abdominal: Soft. Bowel sounds are normal. He exhibits no distension and no mass. There is tenderness (LLQ). There is no rebound and no guarding.  Musculoskeletal: Normal range of motion. He exhibits no edema and no tenderness.  Neurological: He is alert. He displays normal reflexes. No cranial nerve deficit. He exhibits normal muscle tone. Coordination normal.  Skin: Skin is warm and dry. No rash noted. No erythema. No pallor.  Psychiatric: He has a normal mood and affect. His  behavior is normal. Judgment and thought content normal.    ED Course  Procedures (including critical care time)  Labs Reviewed  CBC - Abnormal; Notable for the following:    WBC 15.1 (*)     RBC 3.71 (*)     Hemoglobin 12.4 (*)     HCT 35.6 (*)     All other components within normal limits  COMPREHENSIVE METABOLIC PANEL   Dg Abd Acute W/chest  07/24/2011  *RADIOLOGY REPORT*  Clinical Data: Abdominal pain.  History of Crohn's disease.  ACUTE ABDOMEN SERIES (ABDOMEN 2 VIEW & CHEST 1 VIEW)  Comparison: 07/17/2011  Findings: The chest radiograph demonstrates clear lungs. Heart and mediastinum are within normal limits.  The trachea is midline.  No evidence for free air.  There is a nonobstructive bowel gas pattern. There is less gaseous distention compared to the prior examination.  Stomach is mildly distended with an air-fluid level. There are phleboliths in the pelvis.  IMPRESSION: No acute chest findings.  Nonobstructive bowel gas pattern.  Original Report Authenticated By: Markus Daft, M.D.     1. Abdominal pain       MDM  41 yo M w/hx of Crohn's Disease presents with LLQ abdominal pain and associated nausea since he was discharged from the hospital 5 days ago. Patient is tolerating oral intake well without vomiting. Mild TTP overlying LLQ and no peritoneal signs on exam. Patient is afebrile. Blood pressure is elevated, but near baseline from reading medical records. Patient had CT scan of abdomen last month that was negative for fistulization, wall thickening, or other pathology. Clinical picture, including acute abdominal series, not concerning for obstruction. WBC is elevated; however, no left shift and patient has recently resumed prednisone. Patient complains of blood in stool, but negative hemoccult in ED. Will treat pain symptomatically and transfer care to CDU (NP Sharlet Salina apprised of course and plan) while treating pain.        Marco Collie, MD 07/24/11 [phone]

## 2011-07-24 NOTE — ED Notes (Addendum)
Pt reports being seen  On th 19th for "Chrons flair up and they found an intestinal blockage.  Reports "extreme pain while having a bowel movement, followed by blood in the stool 2-3 x per day x 5 days. Reports having an appointment with GI Dr. In [place] on the July 2nd, "but with the pain I won't be able to make it that long".  Reports N/V, last episode this morning. "Last night at approx 1130pm while laying in bed, my chest got tight on the left side and I had to take real deep breaths in order to breath. Lasted about 19mn". Stated he has a Hxt of heartburn associate with the Chron's , "but this did not feel like heartbrun". A.O. X 4. Family at bedside.

## 2011-07-24 NOTE — Discharge Instructions (Signed)
Benjamin Murillo we gave you morphine in the ER today for your abdominal.  Continue taking your prednisone and mercaptopurine as prescribed.  Keep your follow up appointment with [facility] on July 2.  Return to ER for severe pain or high fever. There was no blood in your stool tonight.

## 2011-07-24 NOTE — H&P (Signed)
Seen and examined on the day of admit.  See my note of that time.  I agree with Dr. Marvene Staff documentation and management.

## 2011-07-24 NOTE — ED Provider Notes (Signed)
I saw and evaluated the patient, reviewed the resident's note and I agree with the findings and plan.   [name], MD 24/81/85 9093

## 2011-07-24 NOTE — H&P (Signed)
[facility] Admission History and Physical  Patient name: Benjamin Murillo Medical record number: [id number]  Date of birth: [birth year] Age: 41 y.o. Gender: male  Primary Care Provider: Recently established with [facility]  Chief Complaint: abdominal pain  History of Present Illness: Benjamin Murillo is a 42 y.o. year old male with Crohn's disease presenting with nausea, vomiting, abdominal pain and bloody diarrhea. He states that he has had abdominal pain/nausea for the last month. However, it has gotten worse over the last 3 days and started having bloody diarrhea yesterday. He usually has 13 bowel movements a day and thinks he may be having more recently. While he is able to tolerate liquids okay, he has been unable to eat solid food for the last 3 days secondary to nausea and vomiting. He has diffuse abdominal pain, worse on the left, that is constant and sharp in nature. He always has some diarrhea, but he has noticed some dark blood in it the last 2 days. Last BM this morning. He has been taking 2-4 of his Percocets daily for the pain; he reports alleviation of pain with this medication.  He sees Dr. Roger Shelter at [facility] gastroenterology for his Crohns. He was recently started on a prednisone taper (currently at 66m daily). He had been on a maximum dose of 40 mg about 2 months ago. He had been tried on a prednisone taper last year, but his had a flare and had to go back up on his dose.  He had a CT-abdomen about 3 weeks ago in the ED due to his abdominal pain. It did not show any worrisome findings, including abscess.  No fevers or chills at home, but has had night sweats for the last 3 months. He has been lightheaded and seeing spots in his vision today.  ED Course: Given fentanyl x1 and zofran x1. Labs wnl. Abdominal films showed ileus vs partial SBO. In effort to avoid another CT scan, ED called for admission for observation.  There is no problem list on file for  this patient.  Past Medical History:  Past Medical History   Diagnosis  Date   .  Crohn disease    Past Surgical History:  Past Surgical History   Procedure  Date   .  Bowel resection      x2   Social History:  History    Social History   .  Marital Status:  Single     Spouse Name:  N/A     Number of Children:  N/A   .  Years of Education:  N/A    Social History Main Topics   .  Smoking status:  Current Everyday Smoker -- 0.1 packs/day     Types:  Cigarettes   .  Smokeless tobacco:  None   .  Alcohol Use:  No   .  Drug Use:  Yes     Special:  Marijuana   .  Sexually Active:  None    Other Topics  Concern   .  None    Social History Narrative   .  None   Family History:  No family history on file.  Allergies:  No Known Allergies  Current Facility-Administered Medications   Medication  Dose  Route  Frequency  Provider  Last Rate  Last Dose   .  fentaNYL (SUBLIMAZE) injection 100 mcg  100 mcg  Intravenous  Once  [employer] PAlvino Chapel MD   100 mcg at  07/17/11 2026   .  ondansetron (ZOFRAN) injection 4 mg  4 mg  Intravenous  Once  [employer]. Alvino Chapel, MD   4 mg at 07/17/11 2026    Current Outpatient Prescriptions   Medication  Sig  Dispense  Refill   .  mercaptopurine (PURINETHOL) 50 MG tablet  Take 75 mg by mouth daily. Give on an empty stomach 1 hour before or 2 hours after meals. Caution: Chemotherapy.     .  predniSONE (DELTASONE) 10 MG tablet  Take 10 mg by mouth daily.     .  promethazine (PHENERGAN) 25 MG tablet  Take 25 mg by mouth every 6 (six) hours as needed. For nausea     Review Of Systems: Per HPI with the following additions: none  Otherwise 12 point review of systems was performed and was unremarkable.  Physical Exam:  Pulse:  79  Blood Pressure:  138/93  RR:  18  O2:  99 on RA  Temp:  98.5   General: alert, cooperative, appears stated age and very thin  HEENT: PERRLA, extra ocular movement intact, sclera clear, anicteric, oropharynx clear, no lesions and  slightly dry mucous membranes  Heart: S1, S2 normal, no murmur, rub or gallop, regular rate and rhythm  Lungs: clear to auscultation, no wheezes or rales and unlabored breathing  Abdomen: +BS, scaphoid, ND, diffusely TTP (worse on left side), no rebound or guarding  Extremities: extremities normal, atraumatic, no cyanosis or edema  Skin:no rashes, no ecchymoses  Neurology: normal without focal findings, mental status, speech normal, alert and oriented x3, PERLA and cranial nerves 2-12 intact  Labs and Imaging:  Lab  07/17/11 1406   WBC  9.0   HGB  12.4*   HCT  34.9*   PLT  225    Lab  07/17/11 1406   NA  136   K  3.4*   CL  99   CO2  25   BUN  9   CREATININE  0.91   CALCIUM  9.2   PROT  6.9   BILITOT  0.3   ALKPHOS  62   ALT  12   AST  19   GLUCOSE  108*   KUB  Minimal ileus or partial obstruction involving bowel loops in the  pelvis and right lower abdomen.  Assessment and Plan:  Benjamin Murillo is a 41 y.o. year old male with Crohn's disease presenting with flare with ileus vs partial SBO.  # Ileus vs partial SBO: Likely related to crohn's flare with bloody BMs for the last 2 days. No evidence for acute infection with a normal white count and no fevers. Continues to have BMs.  - monitor overnight  - increase prednisone from 10 mg to 40 mg  - ESR/CRP. Will get CT/start antibiotics if patient shows signs of infection.  - Will attempt to get in touch with his GI doctor at [facility] tomorrow morning  - clear liquid diet  - IVFs for hydration  - can place NG if needed  - zofran for nausea  - morphine 21m IV q4hr prn  # Mild dehydration: Secondary to n/v.  - IVF hydration as above  # Crohn's disease with flare: Diagn[facility] at age 41 [facility] Currently on prednisone taper; on 142mdaily. Night sweats likely related to flare as no evidence on recent CT for abscess or infection.  - continue prednisone 1016maily  - continue mercaptopurine 53m19mily    -  will call GI doctor in the morning  FEN/GI: clear liquid diet; NS+20KCl at 150cc/hr; Kdur 78mq PO x3 doses  Prophylaxis: SQ heparin  Disposition: admit for observation   BOOTH, ELone Oak 07/17/2011, 8:29 PM  OWest [place] 07/17/2011, 9:24 PM  [phone]

## 2011-07-25 NOTE — ED Notes (Signed)
Patient is AOx4 and comfortable with his discharge instructions.

## 2011-07-25 NOTE — ED Notes (Signed)
Patient's ride home is present.

## 2011-09-27 ENCOUNTER — Emergency Department (HOSPITAL_COMMUNITY)
Admission: EM | Admit: 2011-09-27 | Discharge: 2011-09-27 | Disposition: A | Discharge status: Home / Self Care | Payer: Medicare Other | Attending: Emergency Medicine | Admitting: Emergency Medicine

## 2011-09-27 ENCOUNTER — Encounter (HOSPITAL_COMMUNITY): Payer: Self-pay

## 2011-09-27 DIAGNOSIS — F17200 Nicotine dependence, unspecified, uncomplicated: Secondary | ICD-10-CM | POA: Insufficient documentation

## 2011-09-27 DIAGNOSIS — R109 Unspecified abdominal pain: Secondary | ICD-10-CM

## 2011-09-27 DIAGNOSIS — Z79899 Other long term (current) drug therapy: Secondary | ICD-10-CM | POA: Insufficient documentation

## 2011-09-27 DIAGNOSIS — H00039 Abscess of eyelid unspecified eye, unspecified eyelid: Secondary | ICD-10-CM | POA: Insufficient documentation

## 2011-09-27 DIAGNOSIS — H00019 Hordeolum externum unspecified eye, unspecified eyelid: Secondary | ICD-10-CM

## 2011-09-27 DIAGNOSIS — I10 Essential (primary) hypertension: Secondary | ICD-10-CM | POA: Insufficient documentation

## 2011-09-27 DIAGNOSIS — Z886 Allergy status to analgesic agent status: Secondary | ICD-10-CM | POA: Insufficient documentation

## 2011-09-27 DIAGNOSIS — K5090 Crohn's disease, unspecified, without complications: Secondary | ICD-10-CM | POA: Insufficient documentation

## 2011-09-27 LAB — COMPREHENSIVE METABOLIC PANEL
Albumin: 4.1 g/dL (ref 3.5–5.2)
BUN: 9 mg/dL (ref 6–23)
Calcium: 9.6 mg/dL (ref 8.4–10.5)
GFR calc Af Amer: >90 mL/min (ref >90)
Glucose, Bld: 112 mg/dL — ABNORMAL HIGH (ref 70–99)
Potassium: 4.1 mEq/L (ref 3.5–5.1)
Sodium: 133 mEq/L — ABNORMAL LOW (ref 135–145)
Total Protein: 7.4 g/dL (ref 6.0–8.3)

## 2011-09-27 LAB — CBC WITH DIFFERENTIAL/PLATELET
Basophils Relative: 0 % (ref 0–1)
Eosinophils Absolute: 0.1 10*3/uL (ref 0.0–0.7)
Eosinophils Relative: 1 % (ref 0–5)
Lymphs Abs: 2.2 10*3/uL (ref 0.7–4.0)
MCH: 33.6 pg (ref 26.0–34.0)
MCHC: 34.9 g/dL (ref 30.0–36.0)
MCV: 96.2 fL (ref 78.0–100.0)
Neutrophils Relative %: 72 % (ref 43–77)
Platelets: 269 10*3/uL (ref 150–400)
RBC: 3.69 MIL/uL — ABNORMAL LOW (ref 4.22–5.81)

## 2011-09-27 LAB — LIPASE, BLOOD: Lipase: 26 U/L (ref 11–59)

## 2011-09-27 MED ORDER — PREDNISONE 20 MG PO TABS: 40.0000 mg | ORAL_TABLET | Freq: Every day | ORAL | Status: AC

## 2011-09-27 MED ORDER — OXYCODONE-ACETAMINOPHEN 5-325 MG PO TABS
2.0000 | ORAL_TABLET | Freq: Once | ORAL | Status: AC
  Administered 2011-09-27: 2 via ORAL
  Filled 2011-09-27: qty 2

## 2011-09-27 MED ORDER — ERYTHROMYCIN BASE 250 MG PO TABS: 250.0000 mg | ORAL_TABLET | Freq: Four times a day (QID) | ORAL | Status: AC

## 2011-09-27 MED ORDER — PREDNISONE 20 MG PO TABS
40.0000 mg | ORAL_TABLET | Freq: Once | ORAL | Status: AC
  Administered 2011-09-27: 40 mg via ORAL
  Filled 2011-09-27: qty 2

## 2011-09-27 MED ORDER — OXYCODONE-ACETAMINOPHEN 5-325 MG PO TABS: 1.0000 | ORAL_TABLET | Freq: Four times a day (QID) | ORAL | Status: AC | PRN

## 2011-09-27 NOTE — ED Provider Notes (Signed)
History     CSN: [id number]  Arrival date & time 09/27/11  1419   First MD Initiated Contact with Patient 09/27/11 2016      No chief complaint on file.   (Consider location/radiation/quality/duration/timing/severity/associated sxs/prior treatment) HPI Comments: Benjamin Murillo prsents c/o 7 days of abdominal pain.  He states that he has a history of crohn's disease and this feels like an exacerbation.  He has not seen his gastroenterologist recently and does not have a local primary physician.  He has been taking prednisone 32m daily.  The pain has been associated with loose, nonbloody stools.  He denies fevers.  He also complains of a painful swelling and redness of his left upper eyelid over the last week.  He denies any visual changes but describes a sensation almost like a foreign body secondary to the swelling.  There has been no drainage from the lesion.  Patient is a 41y.o. male presenting with abdominal pain. The history is provided by the patient. No language interpreter was used.  Abdominal Pain The primary symptoms of the illness include abdominal pain, fatigue and diarrhea. The primary symptoms of the illness do not include fever, shortness of breath, nausea, vomiting, hematemesis, hematochezia, dysuria, vaginal discharge or vaginal bleeding. The current episode started more than 2 days ago (1 week). The onset of the illness was gradual. The problem has been gradually worsening.  The diarrhea began 6 to 7 days ago. The diarrhea is mucous and semi-solid. The diarrhea occurs 2 to 4 times per day.  The patient states that she believes she is currently not pregnant. The patient has not had a change in bowel habit. Risk factors: crohn's. Symptoms associated with the illness do not include chills, anorexia, diaphoresis, heartburn, constipation, urgency, hematuria, frequency or back pain. Associated symptoms comments: Pt reports chronic night sweats.  Denies respiratory symptoms.. Significant  associated medical issues include inflammatory bowel disease. Significant associated medical issues do not include PUD, GERD, diabetes, sickle cell disease, gallstones, liver disease, substance abuse, diverticulitis, HIV or cardiac disease.    Past Medical History  Diagnosis Date  . Crohn disease   . Hypertension     Past Surgical History  Procedure Date  . Bowel resection     x2  . Colon surgery     No family history on file.  History  Substance Use Topics  . Smoking status: Current Everyday Smoker -- 0.1 packs/day for 10 years    Types: Cigarettes  . Smokeless tobacco: Not on file  . Alcohol Use: No      Review of Systems  Constitutional: Positive for appetite change and fatigue. Negative for fever, chills, diaphoresis, activity change and unexpected weight change.  HENT: Negative for congestion, sore throat, rhinorrhea, trouble swallowing, neck pain and neck stiffness.   Eyes: Negative for photophobia, pain, discharge, redness, itching and visual disturbance.       Eyelid pain and swelling  Respiratory: Negative for apnea, cough, chest tightness and shortness of breath.   Cardiovascular: Negative for chest pain, palpitations and leg swelling.  Gastrointestinal: Positive for abdominal pain and diarrhea. Negative for heartburn, nausea, vomiting, constipation, blood in stool, hematochezia, abdominal distention, anal bleeding, anorexia and hematemesis.  Genitourinary: Negative for dysuria, urgency, frequency, hematuria, discharge, vaginal bleeding and vaginal discharge.  Musculoskeletal: Negative for myalgias and back pain.  Skin: Positive for color change. Negative for pallor, rash and wound.       eyelid  Neurological: Negative.   Psychiatric/Behavioral: Negative.  Allergies  Morphine and related  Home Medications   Current Outpatient Rx  Name Route Sig Dispense Refill  . MERCAPTOPURINE 50 MG PO TABS Oral Take 75 mg by mouth daily. Give on an empty stomach 1  hour before or 2 hours after meals. Caution: Chemotherapy.    Marland Kitchen PREDNISONE 10 MG PO TABS Oral Take 30 mg by mouth daily.      BP 152/96  Pulse 74  Temp 98.7 F (37.1 C) (Oral)  Resp 18  Ht 5' 11"  (1.803 m)  Wt 120 lb (54.432 kg)  BMI 16.74 kg/m2  SpO2 99%  Physical Exam  Nursing note and vitals reviewed. Constitutional: He is oriented to person, place, and time. He appears well-developed and well-nourished. No distress.  HENT:  Head: Normocephalic and atraumatic.  Right Ear: External ear normal.  Left Ear: External ear normal.  Nose: Nose normal.  Mouth/Throat: Oropharynx is clear and moist. No oropharyngeal exudate.  Eyes: Conjunctivae and EOM are normal. Right eye exhibits no chemosis, no discharge, no exudate and no hordeolum. No foreign body present in the right eye. Left eye exhibits hordeolum. Left eye exhibits no chemosis, no discharge and no exudate. No foreign body present in the left eye. Right conjunctiva is not injected. Right conjunctiva has no hemorrhage. Left conjunctiva is not injected. Left conjunctiva has no hemorrhage. No scleral icterus. Right eye exhibits normal extraocular motion and no nystagmus. Left eye exhibits normal extraocular motion and no nystagmus. Right pupil is round and reactive. Left pupil is reactive. Pupils are equal.  Neck: Normal range of motion. Neck supple. No JVD present. No tracheal deviation present. No thyromegaly present.  Cardiovascular: Normal rate, regular rhythm, normal heart sounds and intact distal pulses.  Exam reveals no gallop and no friction rub.   No murmur heard. Pulmonary/Chest: Effort normal and breath sounds normal. No stridor. No respiratory distress. He has no wheezes. He has no rales. He exhibits no tenderness.  Abdominal: Soft. Bowel sounds are normal. He exhibits no distension and no mass. There is tenderness. There is no rigidity, no rebound, no guarding, no CVA tenderness, no tenderness at McBurney's point and negative  Murphy's sign. No hernia.       Diffuse pain, localizes more to the left side of abd.  Musculoskeletal: Normal range of motion. He exhibits no edema and no tenderness.  Lymphadenopathy:    He has no cervical adenopathy.  Neurological: He is alert and oriented to person, place, and time. No cranial nerve deficit.  Skin: Skin is warm and dry. He is not diaphoretic. There is erythema.       Left upper eyelid and lid margin  Psychiatric: He has a normal mood and affect. His behavior is normal.    ED Course  Procedures (including critical care time)  Labs Reviewed  CBC WITH DIFFERENTIAL - Abnormal; Notable for the following:    RBC 3.69 (*)     Hemoglobin 12.4 (*)     HCT 35.5 (*)     All other components within normal limits  COMPREHENSIVE METABOLIC PANEL - Abnormal; Notable for the following:    Sodium 133 (*)     Glucose, Bld 112 (*)     All other components within normal limits  LIPASE, BLOOD   No results found.   No diagnosis found.    MDM  Benjamin Murillo presents for evaluation of abdominal pain.  He has a known hx of crohn's dx and appears nontoxic, NAD.  Labs performed in triage  demonstrate no leukocytosis and no acute electrolyte abnormalities.  He has very mild tenderness on exam and no evidence of a surgical abdomen or systemic infection.  Plan increase prednisone to 82m BID x 5 days.  Will also provide percocet.  Encouraged outpt f/u.  He also reports almost 1 week of left upper eyelid swelling.  He has findings consistent with a hordeolum on exam.  There are some mild cellulitic changes on exam also.  Encouraged warm compresses.  Plan symptomatic care, po abx, outpt f/u with eye specialist.        MPerlie Mayo MD 09/27/11 [id number]

## 2011-09-27 NOTE — ED Notes (Addendum)
Pt presents with 5-6 day h/o abdominal pain, +nausea, vomiting and diarrhea.  Pt reports h/o crohn's disease with these symptoms similar.  Pt also reports 3-4 day h/o L eyelid infection.  Pt denies any drainage from area, reports he awakens with eyelids matted together.

## 2011-09-27 NOTE — ED Notes (Signed)
Wait time advised 

## 2011-09-27 NOTE — ED Notes (Signed)
The pt outside to smoke.  Waiting to be seen still

## 2011-12-04 ENCOUNTER — Ambulatory Visit: Payer: Self-pay | Admitting: Family

## 2011-12-30 ENCOUNTER — Encounter (HOSPITAL_COMMUNITY): Payer: Self-pay | Admitting: *Deleted

## 2011-12-30 ENCOUNTER — Emergency Department (HOSPITAL_COMMUNITY)
Admission: EM | Admit: 2011-12-30 | Discharge: 2011-12-30 | Disposition: A | Discharge status: Home / Self Care | Payer: Medicare Other | Attending: Emergency Medicine | Admitting: Emergency Medicine

## 2011-12-30 DIAGNOSIS — IMO0002 Reserved for concepts with insufficient information to code with codable children: Secondary | ICD-10-CM | POA: Insufficient documentation

## 2011-12-30 DIAGNOSIS — I10 Essential (primary) hypertension: Secondary | ICD-10-CM | POA: Insufficient documentation

## 2011-12-30 DIAGNOSIS — Z9889 Other specified postprocedural states: Secondary | ICD-10-CM | POA: Insufficient documentation

## 2011-12-30 DIAGNOSIS — R197 Diarrhea, unspecified: Secondary | ICD-10-CM

## 2011-12-30 DIAGNOSIS — G8929 Other chronic pain: Secondary | ICD-10-CM

## 2011-12-30 DIAGNOSIS — K5090 Crohn's disease, unspecified, without complications: Secondary | ICD-10-CM | POA: Insufficient documentation

## 2011-12-30 DIAGNOSIS — R1032 Left lower quadrant pain: Secondary | ICD-10-CM | POA: Insufficient documentation

## 2011-12-30 DIAGNOSIS — Z79899 Other long term (current) drug therapy: Secondary | ICD-10-CM | POA: Insufficient documentation

## 2011-12-30 DIAGNOSIS — F17200 Nicotine dependence, unspecified, uncomplicated: Secondary | ICD-10-CM | POA: Insufficient documentation

## 2011-12-30 DIAGNOSIS — R110 Nausea: Secondary | ICD-10-CM | POA: Insufficient documentation

## 2011-12-30 LAB — OCCULT BLOOD, POC DEVICE: Fecal Occult Bld: NEGATIVE

## 2011-12-30 LAB — BASIC METABOLIC PANEL
CO2: 26 mEq/L (ref 19–32)
Chloride: 101 mEq/L (ref 96–112)
GFR calc non Af Amer: >90 mL/min (ref >90)
Glucose, Bld: 79 mg/dL (ref 70–99)
Potassium: 3.5 mEq/L (ref 3.5–5.1)
Sodium: 137 mEq/L (ref 135–145)

## 2011-12-30 LAB — LIPASE, BLOOD: Lipase: 22 U/L (ref 11–59)

## 2011-12-30 LAB — URINALYSIS, ROUTINE W REFLEX MICROSCOPIC
Glucose, UA: NEGATIVE mg/dL
Hgb urine dipstick: NEGATIVE
Specific Gravity, Urine: 1.031 — ABNORMAL HIGH (ref 1.005–1.030)
Urobilinogen, UA: 1 mg/dL (ref 0.0–1.0)
pH: 6 (ref 5.0–8.0)

## 2011-12-30 LAB — CBC WITH DIFFERENTIAL/PLATELET
HCT: 35.6 % — ABNORMAL LOW (ref 39.0–52.0)
Hemoglobin: 12.7 g/dL — ABNORMAL LOW (ref 13.0–17.0)
Lymphocytes Relative: 22 % (ref 12–46)
Lymphs Abs: 2.1 10*3/uL (ref 0.7–4.0)
Monocytes Absolute: 0.6 10*3/uL (ref 0.1–1.0)
Monocytes Relative: 7 % (ref 3–12)
Neutro Abs: 6.6 10*3/uL (ref 1.7–7.7)
Neutrophils Relative %: 71 % (ref 43–77)
RBC: 3.78 MIL/uL — ABNORMAL LOW (ref 4.22–5.81)

## 2011-12-30 LAB — HEPATIC FUNCTION PANEL
Indirect Bilirubin: 0.5 mg/dL (ref 0.3–0.9)
Total Protein: 7.3 g/dL (ref 6.0–8.3)

## 2011-12-30 MED ORDER — OXYCODONE-ACETAMINOPHEN 5-325 MG PO TABS: 1.0000 | ORAL_TABLET | Freq: Four times a day (QID) | ORAL | Status: DC | PRN

## 2011-12-30 MED ORDER — HYDROMORPHONE HCL PF 1 MG/ML IJ SOLN
1.0000 mg | Freq: Once | INTRAMUSCULAR | Status: AC
  Administered 2011-12-30: 1 mg via INTRAVENOUS
  Filled 2011-12-30: qty 1

## 2011-12-30 MED ORDER — SODIUM CHLORIDE 0.9 % IV BOLUS (SEPSIS)
1000.0000 mL | Freq: Once | INTRAVENOUS | Status: AC
  Administered 2011-12-30: 1000 mL via INTRAVENOUS

## 2011-12-30 MED ORDER — ONDANSETRON HCL 4 MG/2ML IJ SOLN
4.0000 mg | Freq: Once | INTRAMUSCULAR | Status: AC
  Administered 2011-12-30: 4 mg via INTRAVENOUS
  Filled 2011-12-30: qty 2

## 2011-12-30 MED ORDER — ONDANSETRON HCL 4 MG PO TABS: 4.0000 mg | ORAL_TABLET | Freq: Four times a day (QID) | ORAL | Status: DC

## 2011-12-30 NOTE — ED Provider Notes (Signed)
History     CSN: [id number]  Arrival date & time 12/30/11  1052   First MD Initiated Contact with Patient 12/30/11 1054      No chief complaint on file.   (Consider location/radiation/quality/duration/timing/severity/associated sxs/prior treatment) HPI  41 year-old Male with history of Crohn's disease presents with worsening abdominal pain.  Pt reports for the past month his crohn related pain has been worsen, especially for the past 7 days.  Pain is sharp, stabbing, to his LLQ, non radiating, with nausea and persistent diarrhea.  Has up to 8 bouts of diarrhea, sometimes greenish, sometimes black in color, no blood.  He has night sweats, weight lost, and feels dehydrated.  No fever, cp, sob, dysuria, hematochezia, or rash.  Has been taking his meds including mercaptopurine and prednisone regularly.  Was receiving percocet for pain but hasn't had any for nearly a month.  Pt reports he had has hx of abdominal abscess in the past requiring drainage and also having hx of bowel resection and colon surgery.    Past Medical History  Diagnosis Date  . Crohn disease   . Hypertension     Past Surgical History  Procedure Date  . Bowel resection     x2  . Colon surgery     No family history on file.  History  Substance Use Topics  . Smoking status: Current Every Day Smoker -- 0.1 packs/day for 10 years    Types: Cigarettes  . Smokeless tobacco: Not on file  . Alcohol Use: No      Review of Systems  All other systems reviewed and are negative.    Allergies  Morphine and related  Home Medications   Current Outpatient Rx  Name  Route  Sig  Dispense  Refill  . MERCAPTOPURINE 50 MG PO TABS   Oral   Take 75 mg by mouth daily. Give on an empty stomach 1 hour before or 2 hours after meals. Caution: Chemotherapy.         Marland Kitchen PREDNISONE 10 MG PO TABS   Oral   Take 30 mg by mouth daily.           There were no vitals taken for this visit.  Physical Exam  Nursing note and  vitals reviewed. Constitutional: He appears well-developed and well-nourished. No distress.       Awake, alert, nontoxic appearance.  Thin appearing.  HENT:  Head: Atraumatic.       No mucosal lesions noted  Eyes: Conjunctivae normal are normal. Right eye exhibits no discharge. Left eye exhibits no discharge.  Neck: Normal range of motion. Neck supple.  Cardiovascular: Normal rate and regular rhythm.   Pulmonary/Chest: Effort normal. No respiratory distress. He exhibits no tenderness.  Abdominal: Soft. Bowel sounds are normal. There is no tenderness (diffused tenderness, most significant at LLQ without guarding or rebound tenderness.  No hernia noted.  ). There is no rebound and no guarding.  Genitourinary:       Normal rectal tone, normal stool color.  Musculoskeletal: He exhibits no edema and no tenderness.       ROM appears intact, no obvious focal weakness  Neurological: He is alert.  Skin: Skin is warm and dry. No rash noted.  Psychiatric: He has a normal mood and affect.    ED Course  Procedures (including critical care time)  Results for orders placed during the hospital encounter of 12/30/11  CBC WITH DIFFERENTIAL      Component Value Range  WBC 9.4  4.0 - 10.5 K/uL   RBC 3.78 (*) 4.22 - 5.81 MIL/uL   Hemoglobin 12.7 (*) 13.0 - 17.0 g/dL   HCT 16.1 (*) 09.6 - 04.5 %   MCV 94.2  78.0 - 100.0 fL   MCH 33.6  26.0 - 34.0 pg   MCHC 35.7  30.0 - 36.0 g/dL   RDW 40.9  81.1 - 91.4 %   Platelets 218  150 - 400 K/uL   Neutrophils Relative 71  43 - 77 %   Neutro Abs 6.6  1.7 - 7.7 K/uL   Lymphocytes Relative 22  12 - 46 %   Lymphs Abs 2.1  0.7 - 4.0 K/uL   Monocytes Relative 7  3 - 12 %   Monocytes Absolute 0.6  0.1 - 1.0 K/uL   Eosinophils Relative 1  0 - 5 %   Eosinophils Absolute 0.1  0.0 - 0.7 K/uL   Basophils Relative 0  0 - 1 %   Basophils Absolute 0.0  0.0 - 0.1 K/uL  BASIC METABOLIC PANEL      Component Value Range   Sodium 137  135 - 145 mEq/L   Potassium 3.5   3.5 - 5.1 mEq/L   Chloride 101  96 - 112 mEq/L   CO2 26  19 - 32 mEq/L   Glucose, Bld 79  70 - 99 mg/dL   BUN 10  6 - 23 mg/dL   Creatinine, Ser 7.82  0.50 - 1.35 mg/dL   Calcium 9.2  8.4 - 95.6 mg/dL   GFR calc non Af Amer >90  >90 mL/min   GFR calc Af Amer >90  >90 mL/min  LIPASE, BLOOD      Component Value Range   Lipase 22  11 - 59 U/L  HEPATIC FUNCTION PANEL      Component Value Range   Total Protein 7.3  6.0 - 8.3 g/dL   Albumin 3.9  3.5 - 5.2 g/dL   AST 18  0 - 37 U/L   ALT 9  0 - 53 U/L   Alkaline Phosphatase 67  39 - 117 U/L   Total Bilirubin 0.6  0.3 - 1.2 mg/dL   Bilirubin, Direct 0.1  0.0 - 0.3 mg/dL   Indirect Bilirubin 0.5  0.3 - 0.9 mg/dL  OCCULT BLOOD, POC DEVICE      Component Value Range   Fecal Occult Bld NEGATIVE     No results found.  1. Abdominal pain/ Crohn's disease  MDM  Acute on chronic abdominal pain with hx of crohns.  Pt felt dehydrated although he has moist oral mucosa.  Work up initiated, pain medications given, IVF given.  Will continue to monitor    12:55 PM Reports his pain is improved after receiving pain medication. No Diarrhea in the ED.  Tolerates by mouth.  He has normal electrolytes, and normal white count. Fecal occult blood is negative and nonsurgical abdomen. I recommend patient to followup with her GI specialist for further management of his condition.  Pt discharged with antiemetic and pain meds.    BP 129/85  Pulse 75  Temp 98.6 F (37 C) (Oral)  Resp 18  Ht 5\' 11"  (1.803 m)  Wt 126 lb (57.153 kg)  BMI 17.57 kg/m2  SpO2 100%  I have reviewed nursing notes and vital signs.  I reviewed available ER/hospitalization records thought the EMR     Fayrene Helper, [place] 12/30/11 1257

## 2011-12-30 NOTE — ED Notes (Addendum)
Reports worsening abd pain, diarrhea x 6 days. Avg diarrhea 8x daily. Occasional nausea, no emesis. Abd pain worse with diarrhea & after eating. States has been taking meds daily for Crohn's but ran out of percocet 3-4 weeks ago. Last saw doctor approx 2 months ago but unable to see due to insurance issues. Denies fever, bloody diarrhea but admits to occasional dark black color.

## 2011-12-30 NOTE — ED Notes (Signed)
C/o Crohn's acting up x 1 month, worse past 6 days. Worsening abd pain & diarrhea

## 2011-12-30 NOTE — ED Notes (Signed)
PT requesting more pain medication. PA made aware. 

## 2011-12-31 NOTE — ED Provider Notes (Signed)
Medical screening examination/treatment/procedure(s) were performed by non-physician practitioner and as supervising physician I was immediately available for consultation/collaboration.   Loren Racer, MD 12/31/11 0830

## 2012-02-27 ENCOUNTER — Ambulatory Visit (INDEPENDENT_AMBULATORY_CARE_PROVIDER_SITE_OTHER): Payer: Medicare Other | Admitting: Sports Medicine

## 2012-02-27 VITALS — BP 160/90 | Ht 71.0 in | Wt 125.0 lb

## 2012-02-27 DIAGNOSIS — M5412 Radiculopathy, cervical region: Secondary | ICD-10-CM

## 2012-02-27 DIAGNOSIS — M542 Cervicalgia: Secondary | ICD-10-CM

## 2012-02-27 MED ORDER — PREDNISONE 10 MG PO KIT: PACK | ORAL | Status: DC

## 2012-02-27 NOTE — Patient Instructions (Addendum)
You have been scheduled for an appointment for a MRI of your neck on 02/28/12 at 730 at [facility]- [address] Sherian Maroon  [phone]  You should also get your x-ray when you go in for the MRI  Your prescription for a prednisone dosepack was sent to [facility] on [address]  [facility] will be calling you to scheduled an appointment with them  Thank you for seeing [place] today!

## 2012-02-27 NOTE — Progress Notes (Signed)
HPI: Patient is a 42 y/o right-hand dominant male with PMH of Chron's for which he takes prednisone 10mg  daily and mercaptopurine, who presents with left neck pain.  States that 2 months ago started out with popping sensation in left side of his neck, but since then popping sensation has resolved but he feels a sharp constant pain in left side of neck and top of shoulder which radiates down posterolateral side of arm.  States that he also has left thumb pain, with occasional bluish hue noted at  Tip.  States pain is worse when he is sitting and leaning forward, and when he turns his head to the left.  Feels better when he lies supine and hand down by his side.  Patient states that he has had difficult lifting object with arm.  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  States he had removal of nevus that covered length of arm.  Has tried Percocet for Chron's which he said he noticed also helped his neck pain.  Past medical history as well current medications are reviewed He is allergic to  ROS: Negative except as above  Physical Exam: General: Patient is thin, in NAD Left neck and arm: Forearm and wrist noted to have marked atrophy (chronic).  Extensive scarring from prior surgery noted.  Strength 4/5.  Biceps reflex 1/2, reflexes otherwise normal (2/2 right arm).  Spurling's test positive, left.  Full range of motion of neck noted.  Arm abduction up to 100 degrees.  Patient notes decreased sensation over left forearm, palm, and thumb. Vascularly intact distally.  A/P: Neck pain with cervical radiculopathy.  Patient with positive Spurlings on left side, decreased strength (4/5), decreased left Biceps reflex (1/2).  Will place patient on a Prednisone taper starting at 60mg , down to 10 where he should remain as he takes this dose for Chron's disease.  Will also obtain AP and lateral neck Xrays as well and MRI of the neck, rule out herniation  of nucleus pulposus.  Patient to followup in 1 week. Start PT in the interim for exercises and modalities per protocol for cervical radiculopathy.

## 2012-02-28 ENCOUNTER — Ambulatory Visit
Admission: RE | Admit: 2012-02-28 | Discharge: 2012-02-28 | Disposition: A | Discharge status: Home / Self Care | Payer: Medicare Other | Source: Ambulatory Visit | Attending: Sports Medicine | Admitting: Sports Medicine

## 2012-02-28 DIAGNOSIS — M542 Cervicalgia: Secondary | ICD-10-CM

## 2012-03-02 ENCOUNTER — Ambulatory Visit: Payer: Medicare Other | Admitting: Sports Medicine

## 2012-03-05 ENCOUNTER — Encounter: Payer: Self-pay | Admitting: Sports Medicine

## 2012-03-05 ENCOUNTER — Ambulatory Visit (INDEPENDENT_AMBULATORY_CARE_PROVIDER_SITE_OTHER): Payer: Medicare Other | Admitting: Sports Medicine

## 2012-03-05 ENCOUNTER — Other Ambulatory Visit: Payer: Self-pay | Admitting: Sports Medicine

## 2012-03-05 VITALS — BP 159/103 | HR 69 | Ht 71.0 in | Wt 125.0 lb

## 2012-03-05 DIAGNOSIS — M542 Cervicalgia: Secondary | ICD-10-CM

## 2012-03-05 MED ORDER — ACETAMINOPHEN-CODEINE #3 300-30 MG PO TABS: ORAL_TABLET | ORAL | Status: DC

## 2012-03-05 NOTE — Progress Notes (Signed)
Patient ID: Benjamin Murillo, male   DOB: 02/10/1970, 42 y.o.   MRN: [id number]  Patient comes in today to go over her MRI findings of his cervical spine. He has degenerative changes at both C5-C6 and C6-C7. There is evidence of bulging discs which could potentially affect either the left C6 or C7 nerve root. He continues to have left arm pain with numbness and tingling into the left thumb. He did not notice any real relief with his prednisone taper. Physical exam was not repeated. We simply talked about his ongoing neck dilemma. I recommended a cervical ESI. He has yet to start physical therapy but he tells me that he is scheduled to begin next week. I explained to him the importance of physical therapy and he understands. I've agreed to call him in some Tylenol with Codeine to take 1-2 every 8 hours as needed for severe pain, #40 with no refills. He will followup with me one to 2 weeks after his cervical ESI. Call with questions or concerns in the interim.

## 2012-03-10 ENCOUNTER — Ambulatory Visit: Payer: Medicare Other | Attending: Sports Medicine

## 2012-03-10 DIAGNOSIS — M2569 Stiffness of other specified joint, not elsewhere classified: Secondary | ICD-10-CM | POA: Insufficient documentation

## 2012-03-10 DIAGNOSIS — M542 Cervicalgia: Secondary | ICD-10-CM | POA: Insufficient documentation

## 2012-03-10 DIAGNOSIS — IMO0001 Reserved for inherently not codable concepts without codable children: Secondary | ICD-10-CM | POA: Insufficient documentation

## 2012-03-10 DIAGNOSIS — M25519 Pain in unspecified shoulder: Secondary | ICD-10-CM | POA: Insufficient documentation

## 2012-03-11 ENCOUNTER — Ambulatory Visit: Payer: Medicare Other | Admitting: Physical Therapy

## 2012-03-12 ENCOUNTER — Other Ambulatory Visit: Payer: Medicare Other

## 2012-03-13 ENCOUNTER — Other Ambulatory Visit: Payer: Medicare Other

## 2012-03-18 ENCOUNTER — Ambulatory Visit: Payer: Medicare Other | Admitting: Physical Therapy

## 2012-03-18 ENCOUNTER — Other Ambulatory Visit: Payer: Self-pay | Admitting: *Deleted

## 2012-03-18 MED ORDER — ACETAMINOPHEN-CODEINE #3 300-30 MG PO TABS: ORAL_TABLET | ORAL | Status: DC

## 2012-03-19 ENCOUNTER — Ambulatory Visit: Payer: Medicare Other | Admitting: Physical Therapy

## 2012-03-20 ENCOUNTER — Inpatient Hospital Stay: Admission: RE | Admit: 2012-03-20 | Payer: Medicare Other | Source: Ambulatory Visit

## 2012-03-20 ENCOUNTER — Ambulatory Visit: Payer: Medicare Other | Admitting: Physical Therapy

## 2012-03-24 ENCOUNTER — Ambulatory Visit
Admission: RE | Admit: 2012-03-24 | Discharge: 2012-03-24 | Disposition: A | Discharge status: Home / Self Care | Payer: Medicare Other | Source: Ambulatory Visit | Attending: Sports Medicine | Admitting: Sports Medicine

## 2012-03-24 VITALS — BP 140/89 | HR 70

## 2012-03-24 DIAGNOSIS — M542 Cervicalgia: Secondary | ICD-10-CM

## 2012-03-24 MED ORDER — TRIAMCINOLONE ACETONIDE 40 MG/ML IJ SUSP (RADIOLOGY)
60.0000 mg | Freq: Once | INTRAMUSCULAR | Status: AC
  Administered 2012-03-24: 60 mg via EPIDURAL

## 2012-03-24 MED ORDER — IOHEXOL 300 MG/ML  SOLN
1.0000 mL | Freq: Once | INTRAMUSCULAR | Status: AC | PRN
  Administered 2012-03-24: 1 mL via EPIDURAL

## 2012-03-24 MED ORDER — IOHEXOL 300 MG/ML  SOLN: 1.0000 mL | Freq: Once | INTRAMUSCULAR | Status: AC | PRN

## 2012-03-26 ENCOUNTER — Ambulatory Visit: Payer: Medicare Other | Admitting: Physical Therapy

## 2012-03-27 ENCOUNTER — Ambulatory Visit: Payer: Medicare Other | Admitting: Physical Therapy

## 2012-04-03 ENCOUNTER — Ambulatory Visit: Payer: Medicare Other | Attending: Sports Medicine | Admitting: Physical Therapy

## 2012-04-03 DIAGNOSIS — M542 Cervicalgia: Secondary | ICD-10-CM | POA: Insufficient documentation

## 2012-04-03 DIAGNOSIS — M2569 Stiffness of other specified joint, not elsewhere classified: Secondary | ICD-10-CM | POA: Insufficient documentation

## 2012-04-03 DIAGNOSIS — IMO0001 Reserved for inherently not codable concepts without codable children: Secondary | ICD-10-CM | POA: Insufficient documentation

## 2012-04-03 DIAGNOSIS — M25519 Pain in unspecified shoulder: Secondary | ICD-10-CM | POA: Insufficient documentation

## 2012-04-29 ENCOUNTER — Ambulatory Visit: Payer: Self-pay | Admitting: Gastroenterology

## 2012-05-06 LAB — PATHOLOGY REPORT

## 2012-05-15 ENCOUNTER — Other Ambulatory Visit: Payer: Self-pay | Admitting: Anesthesiology

## 2012-05-15 DIAGNOSIS — G608 Other hereditary and idiopathic neuropathies: Secondary | ICD-10-CM

## 2012-05-15 DIAGNOSIS — R209 Unspecified disturbances of skin sensation: Secondary | ICD-10-CM

## 2012-05-15 DIAGNOSIS — M542 Cervicalgia: Secondary | ICD-10-CM

## 2012-05-15 DIAGNOSIS — M5412 Radiculopathy, cervical region: Secondary | ICD-10-CM

## 2012-05-19 ENCOUNTER — Emergency Department (HOSPITAL_COMMUNITY)
Admission: EM | Admit: 2012-05-19 | Discharge: 2012-05-19 | Disposition: A | Discharge status: Home / Self Care | Payer: Medicare Other | Attending: Emergency Medicine | Admitting: Emergency Medicine

## 2012-05-19 ENCOUNTER — Other Ambulatory Visit: Payer: Self-pay

## 2012-05-19 ENCOUNTER — Encounter (HOSPITAL_COMMUNITY): Payer: Self-pay | Admitting: *Deleted

## 2012-05-19 ENCOUNTER — Emergency Department (HOSPITAL_COMMUNITY): Payer: Medicare Other

## 2012-05-19 ENCOUNTER — Other Ambulatory Visit: Payer: Self-pay | Admitting: Anesthesiology

## 2012-05-19 ENCOUNTER — Other Ambulatory Visit: Payer: Self-pay | Admitting: Sports Medicine

## 2012-05-19 DIAGNOSIS — S0993XA Unspecified injury of face, initial encounter: Secondary | ICD-10-CM | POA: Insufficient documentation

## 2012-05-19 DIAGNOSIS — S139XXA Sprain of joints and ligaments of unspecified parts of neck, initial encounter: Secondary | ICD-10-CM | POA: Insufficient documentation

## 2012-05-19 DIAGNOSIS — T148XXA Other injury of unspecified body region, initial encounter: Secondary | ICD-10-CM

## 2012-05-19 DIAGNOSIS — G608 Other hereditary and idiopathic neuropathies: Secondary | ICD-10-CM

## 2012-05-19 DIAGNOSIS — R209 Unspecified disturbances of skin sensation: Secondary | ICD-10-CM

## 2012-05-19 DIAGNOSIS — Z8739 Personal history of other diseases of the musculoskeletal system and connective tissue: Secondary | ICD-10-CM | POA: Insufficient documentation

## 2012-05-19 DIAGNOSIS — S199XXA Unspecified injury of neck, initial encounter: Secondary | ICD-10-CM | POA: Insufficient documentation

## 2012-05-19 DIAGNOSIS — Y929 Unspecified place or not applicable: Secondary | ICD-10-CM | POA: Insufficient documentation

## 2012-05-19 DIAGNOSIS — F17200 Nicotine dependence, unspecified, uncomplicated: Secondary | ICD-10-CM | POA: Insufficient documentation

## 2012-05-19 DIAGNOSIS — Y939 Activity, unspecified: Secondary | ICD-10-CM | POA: Insufficient documentation

## 2012-05-19 DIAGNOSIS — Z8719 Personal history of other diseases of the digestive system: Secondary | ICD-10-CM | POA: Insufficient documentation

## 2012-05-19 DIAGNOSIS — W19XXXA Unspecified fall, initial encounter: Secondary | ICD-10-CM

## 2012-05-19 DIAGNOSIS — I10 Essential (primary) hypertension: Secondary | ICD-10-CM | POA: Insufficient documentation

## 2012-05-19 DIAGNOSIS — M542 Cervicalgia: Secondary | ICD-10-CM

## 2012-05-19 DIAGNOSIS — W108XXA Fall (on) (from) other stairs and steps, initial encounter: Secondary | ICD-10-CM | POA: Insufficient documentation

## 2012-05-19 DIAGNOSIS — G8929 Other chronic pain: Secondary | ICD-10-CM

## 2012-05-19 DIAGNOSIS — M5412 Radiculopathy, cervical region: Secondary | ICD-10-CM

## 2012-05-19 DIAGNOSIS — Z79899 Other long term (current) drug therapy: Secondary | ICD-10-CM | POA: Insufficient documentation

## 2012-05-19 DIAGNOSIS — M5030 Other cervical disc degeneration, unspecified cervical region: Secondary | ICD-10-CM

## 2012-05-19 HISTORY — DX: Cervicalgia: M54.2

## 2012-05-19 HISTORY — DX: Radiculopathy, cervical region: M54.12

## 2012-05-19 HISTORY — DX: Unspecified abdominal pain: R10.9

## 2012-05-19 HISTORY — DX: Other chronic pain: G89.29

## 2012-05-19 MED ORDER — HYDROMORPHONE HCL PF 2 MG/ML IJ SOLN
2.0000 mg | Freq: Once | INTRAMUSCULAR | Status: AC
  Administered 2012-05-19: 2 mg via INTRAMUSCULAR
  Filled 2012-05-19: qty 1

## 2012-05-19 MED ORDER — METHOCARBAMOL 500 MG PO TABS: 1000.0000 mg | ORAL_TABLET | Freq: Four times a day (QID) | ORAL | Status: DC | PRN

## 2012-05-19 MED ORDER — HYDROCODONE-ACETAMINOPHEN 5-325 MG PO TABS: ORAL_TABLET | ORAL | Status: DC

## 2012-05-19 NOTE — ED Notes (Signed)
Pt slipped down 6 stairs yesterday and tried to catch self and has injured left shoulder, neck, and stinging pain in mid chest.  Ambulatory

## 2012-05-19 NOTE — ED Provider Notes (Signed)
History     CSN: [id number]  Arrival date & time 05/19/12  64   First MD Initiated Contact with Patient 05/19/12 1118      Chief Complaint  Patient presents with  . Fall  . Shoulder Pain  . Neck Pain  . Chest Pain     HPI Pt was seen at 1125.   Per pt, c/o gradual onset and persistence of constant left sided neck and shoulder "pain" that began after he fell down several steps yesterday.  States he started to fall and tried to "catch himself" by holding onto the railing with is left arm.  States he fell to the left side.  Describes his left neck pain as an acute flair of his usual chronic left neck pain, "aching," "stabbing," and per his usual chronic pain pattern.  Pain worsens with palpation of the area and movement of his left arm.  Denies hitting head, no LOC, no AMS, no CP/palpitations, no SOB/cough, no abd pain, no N/V/D, no rash.     Past Medical History  Diagnosis Date  . Crohn disease   . Hypertension   . Chronic neck pain   . Chronic abdominal pain   . Cervical radiculopathy     Past Surgical History  Procedure Laterality Date  . Bowel resection      x2  . Colon surgery       History  Substance Use Topics  . Smoking status: Current Every Day Smoker -- 0.10 packs/day for 10 years    Types: Cigarettes  . Smokeless tobacco: Not on file  . Alcohol Use: No      Review of Systems ROS: Statement: All systems negative except as marked or noted in the HPI; Constitutional: Negative for fever and chills. ; ; Eyes: Negative for eye pain, redness and discharge. ; ; ENMT: Negative for ear pain, hoarseness, nasal congestion, sinus pressure and sore throat. ; ; Cardiovascular: Negative for chest pain, palpitations, diaphoresis, dyspnea and peripheral edema. ; ; Respiratory: Negative for cough, wheezing and stridor. ; ; Gastrointestinal: Negative for nausea, vomiting, diarrhea, abdominal pain, blood in stool, hematemesis, jaundice and rectal bleeding. . ; ; Genitourinary:  Negative for dysuria, flank pain and hematuria. ; ; Musculoskeletal: +left neck and shoulder pain. Negative for back pain. Negative for swelling and deformity.; ; Skin: Negative for pruritus, rash, abrasions, blisters, bruising and skin lesion.; ; Neuro: Negative for headache, lightheadedness and neck stiffness. Negative for weakness, altered level of consciousness , altered mental status, extremity weakness, paresthesias, involuntary movement, seizure and syncope.       Allergies  Morphine and related and Tramadol  Home Medications   Current Outpatient Rx  Name  Route  Sig  Dispense  Refill  . mercaptopurine (PURINETHOL) 50 MG tablet   Oral   Take 75 mg by mouth daily. Give on an empty stomach 1 hour before or 2 hours after meals. Caution: Chemotherapy.         . predniSONE (DELTASONE) 10 MG tablet   Oral   Take 30 mg by mouth daily.         . ondansetron (ZOFRAN) 4 MG tablet   Oral   Take 1 tablet (4 mg total) by mouth every 6 (six) hours.   12 tablet   0     BP 137/86  Pulse 73  Temp(Src) 98.9 F (37.2 C) (Oral)  Resp 18  SpO2 100%  Physical Exam 1130: Physical examination:  Nursing notes reviewed; Vital signs and O2  SAT reviewed;  Constitutional: Well developed, Well nourished, Well hydrated, In no acute distress; Head:  Normocephalic, atraumatic; Eyes: EOMI, PERRL, No scleral icterus; ENMT: Mouth and pharynx normal, Mucous membranes moist; Neck: Supple, Full range of motion, No lymphadenopathy; Cardiovascular: Regular rate and rhythm, No murmur, rub, or gallop; Respiratory: Breath sounds clear & equal bilaterally, No rales, rhonchi, wheezes.  Speaking full sentences with ease, Normal respiratory effort/excursion; Chest: +left upper anterior chest wall tender to palp without soft tissue crepitus, no rash, no ecchymosis.. Movement normal; Abdomen: Soft, Nontender, Nondistended, Normal bowel sounds; Genitourinary: No CVA tenderness; Spine:  No midline CS, TS, LS tenderness.  +TTP left hypertonic trapezius muscle. No abrasions, no ecchymosis; Extremities: Pulses normal, NT left shoulder/elbow/wrist/hand. +Left shoulder w/FROM.  NT to palp entire joint, AC joint, clavicle NT, scapula NT, proximal humerus NT, biceps tendon NT over bicipital groove.  Motor strength at shoulder normal.  Sensation intact over deltoid region, distal NMS intact with left hand having equal strength in the distribution of the median, radial, and ulnar nerve function compared to opposite side, with decreased sensation left thumb per hx of left cervical radiculopathy.  Strong radial pulses.  +FROM left elbow with intact motor strength biceps and triceps muscles to resistance.  Chronic atrophy and scarring of left forearm, per hx. No edema, No calf edema or asymmetry.; Neuro: AA&Ox3, Major CN grossly intact.  Speech clear. Climbs on and off stretcher by himself. Gait steady. No gross focal motor or sensory deficits in extremities.; Skin: Color normal, Warm, Dry.   ED Course  Procedures     MDM  MDM Reviewed: previous chart, nursing note and vitals Reviewed previous: MRI and ECG Interpretation: CT scan, x-ray and ECG    Date: 05/19/2012  Rate: 68  Rhythm: normal sinus rhythm  QRS Axis: normal  Intervals: normal  ST/T Wave abnormalities: normal  Conduction Disutrbances:none  Narrative Interpretation: early repolarization, LVH  Old EKG Reviewed: unchanged; no significant changes from previous EKG dated 11/16/2007.   Dg Ribs Unilateral W/chest Left 05/19/2012  *RADIOLOGY REPORT*  Clinical Data: Posterior left rib pain.  LEFT RIBS AND CHEST - 3+ VIEW  Comparison: Single view of the chest 09/02/2007.  Findings: The lungs are clear.  Heart size is normal.  No pneumothorax or pleural effusion.  No focal bony abnormality.  IMPRESSION: Negative exam.   Original Report Authenticated By: Orlean Patten, M.D.    Ct Cervical Spine Wo Contrast 05/19/2012  *RADIOLOGY REPORT*  Clinical Data: Fall, neck  pain radiating into left hand  CT CERVICAL SPINE WITHOUT CONTRAST  Technique:  Multidetector CT imaging of the cervical spine was performed. Multiplanar CT image reconstructions were also generated.  Comparison: MRI cervical spine 02/28/2012  Findings: No acute fracture, malalignment or prevertebral soft tissue swelling.  There is mild straightening of the normal cervical lordosis.  No significant foraminal or central stenosis by CT.  The lung apices are unremarkable.  No focal soft tissue abnormality.  IMPRESSION:  No acute fracture or malalignment.  Mild straightening of the normal cervical lordosis may be positional, or related to muscle spasm.   Original Report Authenticated By: Jacqulynn Cadet, M.D.    Dg Shoulder Left 05/19/2012  *RADIOLOGY REPORT*  Clinical Data: Fall, shoulder pain, neck pain  LEFT SHOULDER - 2+ VIEW  Comparison: None.  Findings: Glenohumeral joint is intact.  No evidence of scapular fracture  or humeral fracture.  The acromioclavicular joint is intact.  IMPRESSION: No acute osseous abnormality.   Original Report Authenticated By: Nicole Kindred  Leonia Reeves, M.D.    02/27/2012 MRI CS: IMPRESSION:  Mild left foraminal stenosis potentially affecting the exiting left C6 and C7 nerves associated with mild uncovertebral spurring and disc bulging.  Original Report Authenticated By: Dereck Ligas, M.D.   1300:  No acute findings on XR/CT.  Known CS DDD per previous MRI above with known left decreased sensation left thumb per EPIC chart review. Feels improved after pain meds, wants to go home now. Dx and testing d/w pt.  Questions answered.  Verb understanding, agreeable to d/c home with outpt f/u.           Alfonzo Feller, DO 05/22/12 0745

## 2012-05-20 ENCOUNTER — Ambulatory Visit
Admission: RE | Admit: 2012-05-20 | Discharge: 2012-05-20 | Disposition: A | Discharge status: Home / Self Care | Payer: Medicare Other | Source: Ambulatory Visit | Attending: Anesthesiology | Admitting: Anesthesiology

## 2012-05-20 DIAGNOSIS — M542 Cervicalgia: Secondary | ICD-10-CM

## 2012-05-20 DIAGNOSIS — G608 Other hereditary and idiopathic neuropathies: Secondary | ICD-10-CM

## 2012-05-20 DIAGNOSIS — R209 Unspecified disturbances of skin sensation: Secondary | ICD-10-CM

## 2012-05-20 DIAGNOSIS — M5412 Radiculopathy, cervical region: Secondary | ICD-10-CM

## 2012-05-22 ENCOUNTER — Emergency Department: Payer: Self-pay | Admitting: Emergency Medicine

## 2012-06-03 ENCOUNTER — Ambulatory Visit: Payer: Medicare Other | Admitting: Sports Medicine

## 2012-06-06 ENCOUNTER — Emergency Department (HOSPITAL_COMMUNITY): Payer: Medicare Other

## 2012-06-06 ENCOUNTER — Encounter (HOSPITAL_COMMUNITY): Payer: Self-pay | Admitting: Cardiology

## 2012-06-06 ENCOUNTER — Inpatient Hospital Stay (HOSPITAL_COMMUNITY)
Admission: EM | Admit: 2012-06-06 | Discharge: 2012-06-08 | DRG: 387 | Disposition: A | Discharge status: Home / Self Care | Payer: Medicare Other | Attending: Internal Medicine | Admitting: Internal Medicine

## 2012-06-06 DIAGNOSIS — Z9119 Patient's noncompliance with other medical treatment and regimen: Secondary | ICD-10-CM

## 2012-06-06 DIAGNOSIS — K5000 Crohn's disease of small intestine without complications: Principal | ICD-10-CM | POA: Diagnosis present

## 2012-06-06 DIAGNOSIS — E876 Hypokalemia: Secondary | ICD-10-CM | POA: Diagnosis present

## 2012-06-06 DIAGNOSIS — Z91199 Patient's noncompliance with other medical treatment and regimen due to unspecified reason: Secondary | ICD-10-CM

## 2012-06-06 DIAGNOSIS — Z79899 Other long term (current) drug therapy: Secondary | ICD-10-CM

## 2012-06-06 DIAGNOSIS — F1210 Cannabis abuse, uncomplicated: Secondary | ICD-10-CM | POA: Diagnosis present

## 2012-06-06 DIAGNOSIS — K5090 Crohn's disease, unspecified, without complications: Secondary | ICD-10-CM

## 2012-06-06 DIAGNOSIS — F17200 Nicotine dependence, unspecified, uncomplicated: Secondary | ICD-10-CM | POA: Diagnosis present

## 2012-06-06 DIAGNOSIS — M542 Cervicalgia: Secondary | ICD-10-CM | POA: Diagnosis present

## 2012-06-06 DIAGNOSIS — Z9049 Acquired absence of other specified parts of digestive tract: Secondary | ICD-10-CM

## 2012-06-06 DIAGNOSIS — G8929 Other chronic pain: Secondary | ICD-10-CM | POA: Diagnosis present

## 2012-06-06 DIAGNOSIS — IMO0002 Reserved for concepts with insufficient information to code with codable children: Secondary | ICD-10-CM

## 2012-06-06 DIAGNOSIS — I10 Essential (primary) hypertension: Secondary | ICD-10-CM | POA: Diagnosis present

## 2012-06-06 LAB — CBC WITH DIFFERENTIAL/PLATELET
Basophils Absolute: 0 10*3/uL (ref 0.0–0.1)
Basophils Relative: 0 % (ref 0–1)
Eosinophils Absolute: 0.1 10*3/uL (ref 0.0–0.7)
Hemoglobin: 13.1 g/dL (ref 13.0–17.0)
MCH: 33.1 pg (ref 26.0–34.0)
MCHC: 35.8 g/dL (ref 30.0–36.0)
Monocytes Absolute: 0.6 10*3/uL (ref 0.1–1.0)
Monocytes Relative: 5 % (ref 3–12)
Neutrophils Relative %: 76 % (ref 43–77)
RDW: 12.9 % (ref 11.5–15.5)

## 2012-06-06 LAB — URINALYSIS, ROUTINE W REFLEX MICROSCOPIC
Leukocytes, UA: NEGATIVE
Nitrite: NEGATIVE
Specific Gravity, Urine: 1.023 (ref 1.005–1.030)
pH: 6.5 (ref 5.0–8.0)

## 2012-06-06 LAB — COMPREHENSIVE METABOLIC PANEL
AST: 18 U/L (ref 0–37)
Albumin: 3.6 g/dL (ref 3.5–5.2)
BUN: 9 mg/dL (ref 6–23)
Creatinine, Ser: 0.95 mg/dL (ref 0.50–1.35)
Potassium: 3.3 mEq/L — ABNORMAL LOW (ref 3.5–5.1)
Total Protein: 7.3 g/dL (ref 6.0–8.3)

## 2012-06-06 LAB — URINE MICROSCOPIC-ADD ON

## 2012-06-06 LAB — LIPASE, BLOOD: Lipase: 26 U/L (ref 11–59)

## 2012-06-06 MED ORDER — IOHEXOL 300 MG/ML  SOLN
80.0000 mL | Freq: Once | INTRAMUSCULAR | Status: AC | PRN
  Administered 2012-06-06: 80 mL via INTRAVENOUS

## 2012-06-06 MED ORDER — HYDROMORPHONE HCL PF 1 MG/ML IJ SOLN
1.0000 mg | Freq: Once | INTRAMUSCULAR | Status: AC
  Administered 2012-06-06: 1 mg via INTRAVENOUS
  Filled 2012-06-06: qty 1

## 2012-06-06 MED ORDER — ALBUTEROL SULFATE (5 MG/ML) 0.5% IN NEBU: 2.5000 mg | INHALATION_SOLUTION | RESPIRATORY_TRACT | Status: DC | PRN

## 2012-06-06 MED ORDER — HYDROMORPHONE HCL PF 1 MG/ML IJ SOLN
1.0000 mg | INTRAMUSCULAR | Status: DC | PRN
Start: 2012-06-06 — End: 2012-06-07
  Administered 2012-06-07 (×3): 1 mg via INTRAVENOUS
  Filled 2012-06-06 (×3): qty 1

## 2012-06-06 MED ORDER — ACETAMINOPHEN 650 MG RE SUPP: 650.0000 mg | Freq: Four times a day (QID) | RECTAL | Status: DC | PRN

## 2012-06-06 MED ORDER — CIPROFLOXACIN IN D5W 400 MG/200ML IV SOLN
400.0000 mg | Freq: Two times a day (BID) | INTRAVENOUS | Status: DC
  Administered 2012-06-07 (×2): 400 mg via INTRAVENOUS
  Filled 2012-06-06 (×3): qty 200

## 2012-06-06 MED ORDER — IOHEXOL 300 MG/ML  SOLN
50.0000 mL | Freq: Once | INTRAMUSCULAR | Status: AC | PRN
  Administered 2012-06-06: 50 mL via ORAL

## 2012-06-06 MED ORDER — ONDANSETRON HCL 4 MG/2ML IJ SOLN
4.0000 mg | Freq: Once | INTRAMUSCULAR | Status: AC
  Administered 2012-06-06: 4 mg via INTRAVENOUS
  Filled 2012-06-06: qty 2

## 2012-06-06 MED ORDER — ONDANSETRON HCL 4 MG/2ML IJ SOLN
4.0000 mg | Freq: Three times a day (TID) | INTRAMUSCULAR | Status: DC | PRN
  Administered 2012-06-06: 4 mg via INTRAVENOUS
  Filled 2012-06-06: qty 2

## 2012-06-06 MED ORDER — SODIUM CHLORIDE 0.9 % IV SOLN
INTRAVENOUS | Status: DC
  Administered 2012-06-07: via INTRAVENOUS
  Filled 2012-06-06 (×3): qty 1000

## 2012-06-06 MED ORDER — OXYCODONE HCL 5 MG PO TABS
5.0000 mg | ORAL_TABLET | Freq: Four times a day (QID) | ORAL | Status: DC | PRN
  Administered 2012-06-06 – 2012-06-08 (×5): 5 mg via ORAL
  Filled 2012-06-06: qty 2
  Filled 2012-06-06 (×4): qty 1
  Filled 2012-06-06: qty 2

## 2012-06-06 MED ORDER — ACETAMINOPHEN 325 MG PO TABS: 650.0000 mg | ORAL_TABLET | Freq: Four times a day (QID) | ORAL | Status: DC | PRN

## 2012-06-06 MED ORDER — METHYLPREDNISOLONE SODIUM SUCC 125 MG IJ SOLR
125.0000 mg | Freq: Once | INTRAMUSCULAR | Status: AC
  Administered 2012-06-06: 125 mg via INTRAVENOUS
  Filled 2012-06-06: qty 2

## 2012-06-06 MED ORDER — ONDANSETRON HCL 4 MG/2ML IJ SOLN
4.0000 mg | Freq: Four times a day (QID) | INTRAMUSCULAR | Status: DC | PRN
  Administered 2012-06-07: 4 mg via INTRAVENOUS
  Filled 2012-06-06: qty 2

## 2012-06-06 MED ORDER — HYDROMORPHONE HCL PF 1 MG/ML IJ SOLN
1.0000 mg | INTRAMUSCULAR | Status: DC | PRN
  Administered 2012-06-06: 1 mg via INTRAVENOUS
  Filled 2012-06-06: qty 1

## 2012-06-06 MED ORDER — PANTOPRAZOLE SODIUM 40 MG IV SOLR
40.0000 mg | INTRAVENOUS | Status: DC
  Administered 2012-06-06 – 2012-06-07 (×2): 40 mg via INTRAVENOUS
  Filled 2012-06-06 (×3): qty 40

## 2012-06-06 MED ORDER — ENOXAPARIN SODIUM 40 MG/0.4ML ~~LOC~~ SOLN
40.0000 mg | SUBCUTANEOUS | Status: DC
  Administered 2012-06-07: 40 mg via SUBCUTANEOUS
  Filled 2012-06-06 (×3): qty 0.4

## 2012-06-06 MED ORDER — METHYLPREDNISOLONE SODIUM SUCC 40 MG IJ SOLR
40.0000 mg | Freq: Two times a day (BID) | INTRAMUSCULAR | Status: DC
  Administered 2012-06-07 (×2): 40 mg via INTRAVENOUS
  Filled 2012-06-06 (×4): qty 1

## 2012-06-06 MED ORDER — POTASSIUM CHLORIDE CRYS ER 20 MEQ PO TBCR
40.0000 meq | EXTENDED_RELEASE_TABLET | Freq: Once | ORAL | Status: AC
  Administered 2012-06-06: 40 meq via ORAL
  Filled 2012-06-06 (×2): qty 1

## 2012-06-06 MED ORDER — SODIUM CHLORIDE 0.9 % IV BOLUS (SEPSIS)
1000.0000 mL | INTRAVENOUS | Status: AC
  Administered 2012-06-06: 1000 mL via INTRAVENOUS

## 2012-06-06 MED ORDER — METRONIDAZOLE IN NACL 5-0.79 MG/ML-% IV SOLN
500.0000 mg | Freq: Three times a day (TID) | INTRAVENOUS | Status: DC
  Administered 2012-06-07 (×2): 500 mg via INTRAVENOUS
  Filled 2012-06-06 (×4): qty 100

## 2012-06-06 MED ORDER — ONDANSETRON HCL 4 MG PO TABS
4.0000 mg | ORAL_TABLET | Freq: Four times a day (QID) | ORAL | Status: DC | PRN
  Administered 2012-06-07 – 2012-06-08 (×4): 4 mg via ORAL
  Filled 2012-06-06 (×4): qty 1

## 2012-06-06 MED ORDER — SODIUM CHLORIDE 0.9 % IV SOLN
INTRAVENOUS | Status: DC
  Administered 2012-06-06: 18:00:00 via INTRAVENOUS

## 2012-06-06 NOTE — ED Notes (Signed)
Patient transported to X-ray 

## 2012-06-06 NOTE — ED Provider Notes (Signed)
History     CSN: [id number]  Arrival date & time 06/06/12  1118   First MD Initiated Contact with Patient 06/06/12 1130      Chief Complaint  Patient presents with  . Abdominal Pain    (Consider location/radiation/quality/duration/timing/severity/associated sxs/prior treatment) HPI  42 year old male with a past medical history of Crohn's disease presents to the emergency department with chief complaint of abdominal pain.  Patient states that he thinks his Crohn's is flaring up.  He states that he's had constant colicky severe epigastric abdominal pain with associated nausea, vomiting and watery diarrhea for the past week.  He has been able to hold down very little food or fluid.  The patient states that he was unable to take his pain anymore and came in seeking help.  He did not have any pain medication at home and did not try anything to relieve his symptoms.  Patient states that he did have some "black stuff" in his vomit 2 days ago.  He is unsure if it was something he ate.  He denies any blood or mucus in his stool.  He has a past surgical history of 2 bowel resections.  He has also had colonic abscesses previously.  Point of general malaise and chills but denies any fevers.  Patient denies any urinary symptoms.  He denies any contacts with similar symptoms, recent foreign travel or ingestion of suspect food.  Patient denies any chest pain or shortness of breath he denies chest pressure or radiation of pain into the jaw or left arm.  He is a current daily smoker but has no family history of stroke or heart attack.  He denies any chest pain or shortness of breath at this time.  He denies any illicit drug or cocaine use  Past Medical History  Diagnosis Date  . Crohn disease   . Hypertension   . Chronic neck pain   . Chronic abdominal pain   . Cervical radiculopathy     Past Surgical History  Procedure Laterality Date  . Bowel resection      x2  . Colon surgery      History  reviewed. No pertinent family history.  History  Substance Use Topics  . Smoking status: Current Every Day Smoker -- 0.10 packs/day for 10 years    Types: Cigarettes  . Smokeless tobacco: Not on file  . Alcohol Use: No      Review of Systems Ten systems reviewed and are negative for acute change, except as noted in the HPI.   Allergies  Morphine and related and Tramadol  Home Medications   Current Outpatient Rx  Name  Route  Sig  Dispense  Refill  . mercaptopurine (PURINETHOL) 50 MG tablet   Oral   Take 75 mg by mouth daily. Give on an empty stomach 1 hour before or 2 hours after meals. Caution: Chemotherapy.         . predniSONE (DELTASONE) 10 MG tablet   Oral   Take 10 mg by mouth daily.            BP 154/106  Pulse 73  Temp(Src) 99.6 F (37.6 C) (Oral)  Resp 18  SpO2 97%  Physical Exam Physical Exam  Nursing note and vitals reviewed. Constitutional: Fairly thin male.  He appears extremely uncomfortable.  Patient has his arms wrapped over his abdomen and is rocking back-and-forth  HENT:  Head: Normocephalic and atraumatic.  Eyes: Conjunctivae normal are normal. No scleral icterus.  Neck: Normal  range of motion. Neck supple.  Cardiovascular: Normal rate, regular rhythm and normal heart sounds.   Pulmonary/Chest: Effort normal and breath sounds normal. No respiratory distress.  Abdominal:  Scaphoid abdomen.  He is diffusely tender to palpation.  Patient is guarding.  No distention. Musculoskeletal: He exhibits no edema.  Neurological: He is alert.  Skin: Skin is warm and dry. He is not diaphoretic.  Psychiatric: His behavior is normal.     ED Course  Procedures (including critical care time)  Labs Reviewed  CBC WITH DIFFERENTIAL  COMPREHENSIVE METABOLIC PANEL  LIPASE, BLOOD  URINALYSIS, ROUTINE W REFLEX MICROSCOPIC   Ct Abdomen Pelvis W Contrast  06/06/2012  *RADIOLOGY REPORT*  Clinical Data: 42 year old male with abdominal and pelvic pain.  History of Crohn's disease and previous bowel resection.  CT ABDOMEN AND PELVIS WITH CONTRAST  Technique:  Multidetector CT imaging of the abdomen and pelvis was performed following the standard protocol during bolus administration of intravenous contrast.  Contrast: 80mL OMNIPAQUE IOHEXOL 300 MG/ML  SOLN  Comparison: 06/25/2011 CT  Findings: There is wall thickening of the distal small bowel to the cecum (approximately 10 cm in length) compatible with Crohn's disease. There is no evidence of bowel obstruction, abscess, pneumoperitoneum or definite fistula.  The liver, gallbladder, kidneys, spleen, pancreas and adrenal glands are unremarkable. No free fluid, enlarged lymph nodes, biliary dilation or abdominal aortic aneurysm identified.  No acute or suspicious bony abnormalities are identified.  IMPRESSION: Crohn's involvement of the distal small bowel. No evidence of bowel obstruction, abscess or definite fistula.   Original Report Authenticated By: Harmon Pier, M.D.      1. Acute Crohn's disease       MDM  11:59 AM Filed Vitals:   06/06/12 1124  BP: 154/106  Pulse: 73  Temp: 99.6 F (37.6 C)  Resp: 18   Patient with severe abdominal pain consistent with previous Crohn's flares.  Labs are currently pending.  Analgesic pain control as well as fluid resuscitation have been initiated.  We'll obtain CT scan of the abdomen to evaluate for possible inflammatory process.  Patient is hypertensive which may be a result of his pain.  Although he does have a past medical history of hypertension.   12:53 PM Patient with some hypokalemia.    3:16 PM  patient c/o continued pain. I have reordered his medications. Awaiting CT scan  5:15 PM Patient ct shows crohn's flair of the distal small bowel. Patient continues to have pain and nausea but states that they are greatly reduced . He is asking to eat and drink. Will give IC prednisolone  5:53 PM Patient unable to tolerate foods or fluid. Continues to  have severe pain. He has not been taking his mercaptopurine for the past month. He is however on 10 mg of prednisone a day. Patient does not feel comfortable leaving. I have agreed to admit the patient for obs and symptom control.  6:21 PM Patient will be admitted for ED observation by Dr. Lucrezia Starch.  Arthor Captain, PA-C 06/06/12 1821

## 2012-06-06 NOTE — ED Notes (Signed)
Iv team called to start IV. Several attempts by 2 RN's unable to start

## 2012-06-06 NOTE — ED Notes (Signed)
Iv team at bed side to start iv

## 2012-06-06 NOTE — H&P (Signed)
PATIENT DETAILS Name: Benjamin Murillo Age: 42 y.o. Sex: male Date of Birth: Jun 06, 1970 Admit Date: 06/06/2012 WUJ:[id number],NWGNF, MD   CHIEF COMPLAINT:  Abdominal pain with nausea, vomiting and worsening diarrhea for the past 3-5 days  HPI: Benjamin Murillo is a 42 y.o. male with a Past Medical History of Crohn's disease on chronic prednisone therapy-but non compliant to 6 MP who presents today with the above noted complaint.Per patient he was in his usual state of health till 5 days back when he started having worsening of his chronic diarrhea-he normally has abour 4-5 loose stools daily-but now was having round 7-8 episodes daily. This was associated with numerous episodes of non bloody vomiting-approximately 5 times a day. He also began having worsening of his chronic abdominal pain. He was evaluated in the ED-found to have a possible crohn's flare involving the distal small bowel, he was given supportive care in the ED-but he failed to respond, therefore he was referred for admission.  Patient denies any fever, headache, SOB or chest pain.   ALLERGIES:   Allergies  Allergen Reactions  . Morphine And Related Itching    High doses  . Tramadol Other (See Comments)    Makes patient anxious and nervous.    PAST MEDICAL HISTORY: Past Medical History  Diagnosis Date  . Crohn disease   . Hypertension   . Chronic neck pain   . Chronic abdominal pain   . Cervical radiculopathy     PAST SURGICAL HISTORY: Past Surgical History  Procedure Laterality Date  . Bowel resection      x2  . Colon surgery      MEDICATIONS AT HOME: Prior to Admission medications   Medication Sig Start Date End Date Taking? Authorizing Provider  mercaptopurine (PURINETHOL) 50 MG tablet Take 75 mg by mouth daily. Give on an empty stomach 1 hour before or 2 hours after meals. Caution: Chemotherapy.   Yes Historical Provider, MD  predniSONE (DELTASONE) 10 MG tablet Take 10 mg by mouth daily.    Yes Historical  Provider, MD    FAMILY HISTORY: History reviewed. No pertinent family history.  SOCIAL HISTORY:  reports that he has been smoking Cigarettes.  He has a 1 pack-year smoking history. He does not have any smokeless tobacco history on file. He reports that he uses illicit drugs (Marijuana). He reports that he does not drink alcohol.  REVIEW OF SYSTEMS:  Constitutional:   No  weight loss, night sweats,  Fevers, chills, fatigue.  HEENT:    No headaches, Difficulty swallowing,Tooth/dental problems,Sore throat,  No sneezing, itching, ear ache, nasal congestion, post nasal drip,   Cardio-vascular: No chest pain,  Orthopnea, PND, swelling in lower extremities, anasarca, dizziness, palpitations  GI:  No heartburn, indigestion,  loss of appetite  Resp: No shortness of breath with exertion or at rest.  No excess mucus, no productive cough, No non-productive cough,  No coughing up of blood.No change in color of mucus.No wheezing.No chest wall deformity  Skin:  no rash or lesions.  GU:  no dysuria, change in color of urine, no urgency or frequency.  No flank pain.  Musculoskeletal: No joint pain or swelling.  No decreased range of motion.  No back pain.  Psych: No change in mood or affect. No depression or anxiety.  No memory loss.   PHYSICAL EXAM: Blood pressure 150/88, pulse 62, temperature 99.6 F (37.6 C), temperature source Oral, resp. rate 18, SpO2 100.00%.  General appearance :Awake, alert, not in any distress.  Speech Clear. Not toxic Looking HEENT: Atraumatic and Normocephalic, pupils equally reactive to light and accomodation Neck: supple, no JVD. No cervical lymphadenopathy.  Chest:Good air entry bilaterally, no added sounds  CVS: S1 S2 regular, no murmurs.  Abdomen: Bowel sounds present, Mildly tender in the upper abdominal area with no rebound, rigidity or gaurding,abdomen is not distended . Extremities: B/L Lower Ext shows no edema, both legs are warm to  touch Neurology: Awake alert, and oriented X 3, CN II-XII intact, Non focal Skin:No Rash Wounds:N/A  LABS ON ADMISSION:   Recent Labs  06/06/12 1134  NA 136  K 3.3*  CL 100  CO2 25  GLUCOSE 115*  BUN 9  CREATININE 0.95  CALCIUM 9.1    Recent Labs  06/06/12 1134  AST 18  ALT 8  ALKPHOS 75  BILITOT 0.4  PROT 7.3  ALBUMIN 3.6    Recent Labs  06/06/12 1134  LIPASE 26    Recent Labs  06/06/12 1134  WBC 11.2*  NEUTROABS 8.5*  HGB 13.1  HCT 36.6*  MCV 92.4  PLT 269   No results found for this basename: CKTOTAL, CKMB, CKMBINDEX, TROPONINI,  in the last 72 hours No results found for this basename: DDIMER,  in the last 72 hours No components found with this basename: POCBNP,    RADIOLOGIC STUDIES ON ADMISSION: Ct Abdomen Pelvis W Contrast  06/06/2012  *RADIOLOGY REPORT*  Clinical Data: 42 year old male with abdominal and pelvic pain. History of Crohn's disease and previous bowel resection.  CT ABDOMEN AND PELVIS WITH CONTRAST  Technique:  Multidetector CT imaging of the abdomen and pelvis was performed following the standard protocol during bolus administration of intravenous contrast.  Contrast: 11m OMNIPAQUE IOHEXOL 300 MG/ML  SOLN  Comparison: 06/25/2011 CT  Findings: There is wall thickening of the distal small bowel to the cecum (approximately 10 cm in length) compatible with Crohn's disease. There is no evidence of bowel obstruction, abscess, pneumoperitoneum or definite fistula.  The liver, gallbladder, kidneys, spleen, pancreas and adrenal glands are unremarkable. No free fluid, enlarged lymph nodes, biliary dilation or abdominal aortic aneurysm identified.  No acute or suspicious bony abnormalities are identified.  IMPRESSION: Crohn's involvement of the distal small bowel. No evidence of bowel obstruction, abscess or definite fistula.   Original Report Authenticated By: JMargarette Canada M.D.      ASSESSMENT AND PLAN: Present on Admission:  . Crohn's  ileitis -Will admit as observation, Hydrate with IVF, Continue IV solumedrol and start empiric Cipro and Flagyl. -stool for C Diff PCR will be sent. -patient [facility] GI [facility] Hypokalemia -2/2 GI loss -will replete and will recheck in am  Further plan will depend as patient's clinical course evolves and further radiologic and laboratory data become available. Patient will be monitored closely.   DVT Prophylaxis: Prophylactic Lovenox   Code Status: Full Code  Total time spent for admission equals 45 minutes.  GPark LayneHospitalists Pager [phone] If 7PM-7AM, please contact night-coverage [email or web address] Password [email or web address] 06/06/2012, 6:30 PM

## 2012-06-06 NOTE — Progress Notes (Signed)
ANTIBIOTIC CONSULT NOTE - INITIAL  Pharmacy Consult for Ciprofloxacin Indication: Chron's Ileitis  Allergies  Allergen Reactions  . Morphine And Related Itching    High doses  . Tramadol Other (See Comments)    Makes patient anxious and nervous.    Patient Measurements: Height: 5' 10.87" (180 cm) Weight: 125 lb (56.7 kg) IBW/kg (Calculated) : 74.99  Vital Signs: Temp: 99.6 F (37.6 C) (05/10 1124) Temp src: Oral (05/10 1124) BP: 136/86 mmHg (05/10 2017) Pulse Rate: 65 (05/10 2017) Intake/Output from previous day:   Intake/Output from this shift:    Labs:  Recent Labs  06/06/12 1134  WBC 11.2*  HGB 13.1  PLT 269  CREATININE 0.95   Estimated Creatinine Clearance: 83.7 ml/min (by C-G formula based on Cr of 0.95). No results found for this basename: VANCOTROUGH, VANCOPEAK, VANCORANDOM, GENTTROUGH, GENTPEAK, GENTRANDOM, TOBRATROUGH, TOBRAPEAK, TOBRARND, AMIKACINPEAK, AMIKACINTROU, AMIKACIN,  in the last 72 hours   Microbiology: No results found for this or any previous visit (from the past 720 hour(s)).  Medical History: Past Medical History  Diagnosis Date  . Crohn disease   . Hypertension   . Chronic neck pain   . Chronic abdominal pain   . Cervical radiculopathy     Assessment: 42 y.o. M who presented to the [facility] on 06/06/12 with abdominal pain thought to be related to Chron's disease. Pharmacy was consulted to start Ciprofloxacin + Flagyl per MD for empiric coverage for Chron's ileitis. SCr 0.95, CrCl~100 ml/min.   Goal of Therapy:  Proper antibiotics for infection/cultures adjusted for renal/hepatic function   Plan:  1. Ciprofloxacin 400 mg IV every 12 hours 2. Will continue to follow renal function, culture results, LOT, and antibiotic de-escalation plans   Georgina Pillion, PharmD, BCPS Clinical Pharmacist Pager: [phone] 06/06/2012 9:55 PM

## 2012-06-06 NOTE — ED Notes (Signed)
Pt reports hx of crohns and thinks that he has had a flare up. States generalized pain and n/v for the past week. States he has not been able to hold much down. Denies any urinary symptoms.

## 2012-06-06 NOTE — ED Provider Notes (Signed)
Medical screening examination/treatment/procedure(s) were conducted as a shared visit with non-physician practitioner(s) and myself.  I personally evaluated the patient during the encounter.  Chronic abdominal pain, Crohn's disease, chronic steroids, flare up of pain several days with vomiting with diffuse moderate tenderness without rebound in the ED but not improving enough to take oral fluids for discharge so unassigned medicine paged to consider observation. 1755  Hurman Horn, MD 06/06/12 2253

## 2012-06-06 NOTE — ED Notes (Signed)
Patient asked for urine sample. 

## 2012-06-07 LAB — COMPREHENSIVE METABOLIC PANEL
ALT: 9 U/L (ref 0–53)
AST: 20 U/L (ref 0–37)
CO2: 20 mEq/L (ref 19–32)
Calcium: 9.1 mg/dL (ref 8.4–10.5)
GFR calc non Af Amer: >90 mL/min (ref >90)
Potassium: 4.3 mEq/L (ref 3.5–5.1)
Sodium: 129 mEq/L — ABNORMAL LOW (ref 135–145)
Total Protein: 7 g/dL (ref 6.0–8.3)

## 2012-06-07 LAB — CBC
MCH: 32.5 pg (ref 26.0–34.0)
Platelets: 263 10*3/uL (ref 150–400)
RBC: 4.06 MIL/uL — ABNORMAL LOW (ref 4.22–5.81)

## 2012-06-07 LAB — CLOSTRIDIUM DIFFICILE BY PCR: Toxigenic C. Difficile by PCR: NEGATIVE

## 2012-06-07 MED ORDER — PREDNISONE 20 MG PO TABS
40.0000 mg | ORAL_TABLET | Freq: Every day | ORAL | Status: DC
  Administered 2012-06-07 – 2012-06-08 (×2): 40 mg via ORAL
  Filled 2012-06-07 (×4): qty 2

## 2012-06-07 MED ORDER — HYDROMORPHONE HCL PF 1 MG/ML IJ SOLN
1.0000 mg | Freq: Four times a day (QID) | INTRAMUSCULAR | Status: DC | PRN
  Administered 2012-06-07 – 2012-06-08 (×3): 1 mg via INTRAVENOUS
  Filled 2012-06-07 (×3): qty 1

## 2012-06-07 NOTE — Progress Notes (Addendum)
PATIENT DETAILS Name: Benjamin Murillo Age: 42 y.o. Sex: male Date of Birth: September 29, 1970 Admit Date: 06/06/2012 Admitting Physician Evalee Mutton Kristeen Mans, MD ALP:[id number],OXBDZ, MD  Subjective: Feels much better-no diarrhea since admission. No vomiting as well-still feels nauseous.  Assessment/Plan: Active Problems:   Crohn's ileitis -better than on admission -belly is soft-decreased upper abdominal tenderness compared to yesterday -advance diet-if does well-will discharge later today -stop IVF and change Solumedrol to prednisone -C diff PCR neg-stop cipro and flagyl  Disposition: Remain inpatient-home 5/12  DVT Prophylaxis: Prophylactic Lovenox   Code Status: Full code   Family Communication None at bedside  Procedures:  None  CONSULTS:  None   MEDICATIONS: Scheduled Meds: . ciprofloxacin  400 mg Intravenous Q12H  . enoxaparin (LOVENOX) injection  40 mg Subcutaneous Q24H  . metronidazole  500 mg Intravenous Q8H  . pantoprazole (PROTONIX) IV  40 mg Intravenous Q24H  . predniSONE  40 mg Oral Q breakfast   Continuous Infusions:  PRN Meds:.acetaminophen, acetaminophen, albuterol, HYDROmorphone (DILAUDID) injection, ondansetron (ZOFRAN) IV, ondansetron, oxyCODONE  Antibiotics: Anti-infectives   Start     Dose/Rate Route Frequency Ordered Stop   06/06/12 2300  ciprofloxacin (CIPRO) IVPB 400 mg     400 mg 200 mL/hr over 60 Minutes Intravenous Every 12 hours 06/06/12 2155     06/06/12 2200  metroNIDAZOLE (FLAGYL) IVPB 500 mg     500 mg 100 mL/hr over 60 Minutes Intravenous Every 8 hours 06/06/12 2143         PHYSICAL EXAM: Vital signs in last 24 hours: Filed Vitals:   06/06/12 1600 06/06/12 2017 06/06/12 2159 06/07/12 0538  BP: 129/76 136/86 146/90 130/99  Pulse: 60 65 63 57  Temp:   98.5 F (36.9 C) 98.1 F (36.7 C)  TempSrc:   Oral Oral  Resp:   19 20  Height:  5' 10.87" (1.8 m)    Weight:  56.7 kg (125 lb)    SpO2: 100% 100% 100% 100%    Weight  change:  Filed Weights   06/06/12 2017  Weight: 56.7 kg (125 lb)   Body mass index is 17.5 kg/(m^2).   Gen Exam: Awake and alert with clear speech.  Neck: Supple, No JVD.   Chest: B/L Clear.   CVS: S1 S2 Regular, no murmurs.  Abdomen: soft, BS +,mildly tender in the upper abdomen, non distended. No rigidity or rebound Extremities: no edema, lower extremities warm to touch. Neurologic: Non Focal.   Skin: No Rash.   Wounds: N/A.   Intake/Output from previous day:  Intake/Output Summary (Last 24 hours) at 06/07/12 1046 Last data filed at 06/07/12 3299  Gross per 24 hour  Intake 1081.25 ml  Output    350 ml  Net 731.25 ml     LAB RESULTS: CBC  Recent Labs Lab 06/06/12 1134 06/07/12 0603  WBC 11.2* 12.6*  HGB 13.1 13.2  HCT 36.6* 37.0*  PLT 269 263  MCV 92.4 91.1  MCH 33.1 32.5  MCHC 35.8 35.7  RDW 12.9 12.5  LYMPHSABS 2.0  --   MONOABS 0.6  --   EOSABS 0.1  --   BASOSABS 0.0  --     Chemistries   Recent Labs Lab 06/06/12 1134 06/07/12 0603  NA 136 129*  K 3.3* 4.3  CL 100 94*  CO2 25 20  GLUCOSE 115* 106*  BUN 9 8  CREATININE 0.95 0.79  CALCIUM 9.1 9.1    CBG: No results found for this basename: GLUCAP,  in the  last 168 hours  GFR Estimated Creatinine Clearance: 99.4 ml/min (by C-G formula based on Cr of 0.79).  Coagulation profile No results found for this basename: INR, PROTIME,  in the last 168 hours  Cardiac Enzymes No results found for this basename: CK, CKMB, TROPONINI, MYOGLOBIN,  in the last 168 hours  No components found with this basename: POCBNP,  No results found for this basename: DDIMER,  in the last 72 hours No results found for this basename: HGBA1C,  in the last 72 hours No results found for this basename: CHOL, HDL, LDLCALC, TRIG, CHOLHDL, LDLDIRECT,  in the last 72 hours No results found for this basename: TSH, T4TOTAL, FREET3, T3FREE, THYROIDAB,  in the last 72 hours No results found for this basename: VITAMINB12,  FOLATE, FERRITIN, TIBC, IRON, RETICCTPCT,  in the last 72 hours  Recent Labs  06/06/12 1134  LIPASE 26    Urine Studies No results found for this basename: UACOL, UAPR, USPG, UPH, UTP, UGL, UKET, UBIL, UHGB, UNIT, UROB, ULEU, UEPI, UWBC, URBC, UBAC, CAST, CRYS, UCOM, BILUA,  in the last 72 hours  MICROBIOLOGY: No results found for this or any previous visit (from the past 240 hour(s)).  RADIOLOGY STUDIES/RESULTS: Dg Ribs Unilateral W/chest Left  05/19/2012  *RADIOLOGY REPORT*  Clinical Data: Posterior left rib pain.  LEFT RIBS AND CHEST - 3+ VIEW  Comparison: Single view of the chest 09/02/2007.  Findings: The lungs are clear.  Heart size is normal.  No pneumothorax or pleural effusion.  No focal bony abnormality.  IMPRESSION: Negative exam.   Original Report Authenticated By: Orlean Patten, M.D.    Ct Cervical Spine Wo Contrast  05/19/2012  *RADIOLOGY REPORT*  Clinical Data: Fall, neck pain radiating into left hand  CT CERVICAL SPINE WITHOUT CONTRAST  Technique:  Multidetector CT imaging of the cervical spine was performed. Multiplanar CT image reconstructions were also generated.  Comparison: MRI cervical spine 02/28/2012  Findings: No acute fracture, malalignment or prevertebral soft tissue swelling.  There is mild straightening of the normal cervical lordosis.  No significant foraminal or central stenosis by CT.  The lung apices are unremarkable.  No focal soft tissue abnormality.  IMPRESSION:  No acute fracture or malalignment.  Mild straightening of the normal cervical lordosis may be positional, or related to muscle spasm.   Original Report Authenticated By: Jacqulynn Cadet, M.D.    Mr Cervical Spine Wo Contrast  05/20/2012  *RADIOLOGY REPORT*  Clinical Data: Neck pain  MRI CERVICAL SPINE WITHOUT CONTRAST  Technique:  Multiplanar and multiecho pulse sequences of the cervical spine, to include the craniocervical junction and cervicothoracic junction, were obtained according to  standard protocol without intravenous contrast.  Comparison: CT cervical spine 05/19/2012.  MRI 02/28/2012  Findings: Normal cervical alignment.  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  Cerebellar tonsils are low-lying approximately 6 mm below the foramen magnum.  This is unchanged and is consistent with mild Chiari malformation.  Spinal cord has normal signal.  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  There is mild disc bulging at C6-7.  No focal neural impingement is identified.   Original Report Authenticated By: Carl Best, M.D.    Ct Abdomen Pelvis W Contrast  06/06/2012  *RADIOLOGY REPORT*  Clinical Data: 42 year old male with  abdominal and pelvic pain. History of Crohn's disease and previous bowel resection.  CT ABDOMEN AND PELVIS WITH CONTRAST  Technique:  Multidetector CT imaging of the abdomen and pelvis was performed following the standard protocol during bolus administration of intravenous contrast.  Contrast: 25m OMNIPAQUE IOHEXOL 300 MG/ML  SOLN  Comparison: 06/25/2011 CT  Findings: There is wall thickening of the distal small bowel to the cecum (approximately 10 cm in length) compatible with Crohn's disease. There is no evidence of bowel obstruction, abscess, pneumoperitoneum or definite fistula.  The liver, gallbladder, kidneys, spleen, pancreas and adrenal glands are unremarkable. No free fluid, enlarged lymph nodes, biliary dilation or abdominal aortic aneurysm identified.  No acute or suspicious bony abnormalities are identified.  IMPRESSION: Crohn's involvement of the distal small bowel. No evidence of bowel obstruction, abscess or definite fistula.   Original Report Authenticated By: JMargarette Canada M.D.    Mr Shoulder Left Wo Contrast  05/21/2012  *RADIOLOGY  REPORT*  Clinical Data:  Left shoulder pain with painful range of motion. The patient fell on 05/18/2012.  Left-sided neck pain.  MRI OF THE LEFT SHOULDER WITHOUT CONTRAST  Technique:  Multiplanar, multisequence MR imaging of the left shoulder was performed.  No intravenous contrast was administered.  Comparison:  Radiographs dated 05/19/2012  FINDINGS: Rotator cuff:  Intact and normal. Muscles:  Normal. Biceps long head:  Properly located and intact.  Acromioclavicular Joint:  Normal.  Type 2 acromion. Glenohumeral Joint:  Normal.  Labrum:  Normal. Bones:  Normal.  There is no soft tissue edema, bursitis, bone contusion, or other acute abnormality.  IMPRESSION: Normal MRI of the left shoulder.   Original Report Authenticated By: JLorriane Shire M.D.    Dg Shoulder Left  05/19/2012  *RADIOLOGY REPORT*  Clinical Data: Fall, shoulder pain, neck pain  LEFT SHOULDER - 2+ VIEW  Comparison: None.  Findings: Glenohumeral joint is intact.  No evidence of scapular fracture  or humeral fracture.  The acromioclavicular joint is intact.  IMPRESSION: No acute osseous abnormality.   Original Report Authenticated By: SSuzy Bouchard M.D.     GOren Binet MD  [facility] Pager:[phone] If 7PM-7AM, please contact night-coverage [email or web address] Password [email or web address] 06/07/2012, 10:46 AM   LOS: 1 day

## 2012-06-08 MED ORDER — OXYCODONE HCL 5 MG PO TABS: 5.0000 mg | ORAL_TABLET | Freq: Four times a day (QID) | ORAL | Status: DC | PRN

## 2012-06-08 MED ORDER — PROMETHAZINE HCL 25 MG PO TABS: 25.0000 mg | ORAL_TABLET | Freq: Four times a day (QID) | ORAL | Status: DC | PRN

## 2012-06-08 MED ORDER — PREDNISONE 10 MG PO TABS: 10.0000 mg | ORAL_TABLET | Freq: Every day | ORAL | Status: DC

## 2012-06-08 NOTE — Discharge Summary (Signed)
Physician Discharge Summary  [name] UTM:[id number] DOB: 01/27/1971 DOA: 06/06/2012  PCP: Cletis Athens, MD  Admit date: 06/06/2012 Discharge date: 06/08/2012  Time spent: 45 minutes  Recommendations for Outpatient Follow-up:   Gastroenterology follow up is important for the resumption of  6-MP and on-going crohns management.  Discharge Diagnoses:  Active Problems:   Crohn's ileitis   Discharge Condition: stable,  tolerating diet.  BM are 5-6 a day and loose without blood.  Diet recommendation: bland soft diet.  Filed Weights   06/06/12 2017  Weight: 56.7 kg (125 lb)    History of present illness:   Benjamin Murillo is a 42 y.o. male with a Past Medical History of Crohn's disease on chronic prednisone therapy-but non compliant to 6 MP who presents today with the above noted complaint.Per patient he was in his usual state of health till 5 days back when he started having worsening of his chronic diarrhea-he normally has abour 4-5 loose stools daily-but now was having round 7-8 episodes daily. This was associated with numerous episodes of non bloody vomiting-approximately 5 times a day. He also began having worsening of his chronic abdominal pain. He was evaluated in the ED-found to have a possible crohn's flare involving the distal small bowel, he was given supportive care in the ED-but he failed to respond, therefore he was referred for admission.    Hospital Course:  Crohn's Ileitis flare He was treated with IV steroids and antibiotics.  The antibiotics were discontinued when his c-diff PCR returned negative.  He was progressed to oral prednisone.  Patient reports he always hurts but his pain is decreased compared to admission.  He is no longer vomiting.  Abdomen is soft.  Bowel movements are decreased compared to admission.  He was keep one more day in order to ensure he could tolerate a diet, he has tolerated a Dys 3 diet this am.  He is requesting discharge.    He is not  currently taking 6-MP.  He will be discharged on a prednisone taper.  We have offered to schedule GI follow up but the patient insists he do it himself.   Discharge Exam: Filed Vitals:   06/07/12 0538 06/07/12 1433 06/07/12 2136 06/08/12 0500  BP: 130/99 166/98 121/74 124/74  Pulse: 57 83 50 53  Temp: 98.1 F (36.7 C) 98.1 F (36.7 C) 98.3 F (36.8 C) 98 F (36.7 C)  TempSrc: Oral Oral Oral Oral  Resp: 20 18 17 18   Height:      Weight:      SpO2: 100% 100% 93% 96%    Gen Exam: Awake and alert with clear speech. Not in good humor. Neck: Supple, No JVD.  Chest: B/L Clear. No w/c/r CVS: S1 S2 Regular, no murmurs.  Abdomen: Thin, soft, BS +,very minimally tender in the epigastric area, non distended. No rigidity or rebound  Extremities: no edema, lower extremities warm to touch.  Neurologic: Non Focal.  Skin: No Rash.     Discharge Instructions     Medication List    STOP taking these medications       mercaptopurine 50 MG tablet  Commonly known as:  PURINETHOL      TAKE these medications       oxyCODONE 5 MG immediate release tablet  Commonly known as:  Oxy IR/ROXICODONE  Take 1 tablet (5 mg total) by mouth every 6 (six) hours as needed.     predniSONE 10 MG tablet  Commonly known as:  DELTASONE  Take 1 tablet (10 mg total) by mouth daily. Take 4 tablets each morning with breakfast for 1 week (7 days).  Then take 3 tablets each morning with breakfast for 1 week.  Then take 2 tablets each morning with breakfast for 1 week.  Then resume taking 10 mg daily.     promethazine 25 MG tablet  Commonly known as:  PHENERGAN  Take 1 tablet (25 mg total) by mouth every 6 (six) hours as needed for nausea.       Allergies  Allergen Reactions  . Morphine And Related Itching    High doses  . Tramadol Other (See Comments)    Makes patient anxious and nervous.   Follow-up Information   Follow up with [name],[name], MD. Schedule an appointment as soon as possible for a  visit in 1 week.   Contact information:   [address] [place] [address] [phone]        The results of significant diagnostics from this hospitalization (including imaging, microbiology, ancillary and laboratory) are listed below for reference.    Significant Diagnostic Studies: Ct Abdomen Pelvis W Contrast  06/06/2012  *RADIOLOGY REPORT*  Clinical Data: 42 year old male with abdominal and pelvic pain. History of Crohn's disease and previous bowel resection.  CT ABDOMEN AND PELVIS WITH CONTRAST  Technique:  Multidetector CT imaging of the abdomen and pelvis was performed following the standard protocol during bolus administration of intravenous contrast.  Contrast: 8m OMNIPAQUE IOHEXOL 300 MG/ML  SOLN  Comparison: 06/25/2011 CT  Findings: There is wall thickening of the distal small bowel to the cecum (approximately 10 cm in length) compatible with Crohn's disease. There is no evidence of bowel obstruction, abscess, pneumoperitoneum or definite fistula.  The liver, gallbladder, kidneys, spleen, pancreas and adrenal glands are unremarkable. No free fluid, enlarged lymph nodes, biliary dilation or abdominal aortic aneurysm identified.  No acute or suspicious bony abnormalities are identified.  IMPRESSION: Crohn's involvement of the distal small bowel. No evidence of bowel obstruction, abscess or definite fistula.   Original Report Authenticated By: JMargarette Canada M.D.        Microbiology: Recent Results (from the past 240 hour(s))  CLOSTRIDIUM DIFFICILE BY PCR     Status: None   Collection Time    06/07/12 11:30 AM      Result Value Range Status   C difficile by pcr NEGATIVE  NEGATIVE Final     Labs: Basic Metabolic Panel:  Recent Labs Lab 06/06/12 1134 06/07/12 0603  NA 136 129*  K 3.3* 4.3  CL 100 94*  CO2 25 20  GLUCOSE 115* 106*  BUN 9 8  CREATININE 0.95 0.79  CALCIUM 9.1 9.1   Liver Function Tests:  Recent Labs Lab 06/06/12 1134  06/07/12 0603  AST 18 20  ALT 8 9  ALKPHOS 75 69  BILITOT 0.4 0.7  PROT 7.3 7.0  ALBUMIN 3.6 3.5    Recent Labs Lab 06/06/12 1134  LIPASE 26   CBC:  Recent Labs Lab 06/06/12 1134 06/07/12 0603  WBC 11.2* 12.6*  NEUTROABS 8.5*  --   HGB 13.1 13.2  HCT 36.6* 37.0*  MCV 92.4 91.1  PLT 269 263    Signed:  YKaren [facility] 06/08/2012, 9:21 AM  Attending Patient seen and examined, much better-tolerating Dys 3 diet, no further nausea or vomiting. Abd pain is close to his usual baseline, much better than on admission. Stable for discharge today, he will schedule follow up with  GI himself.   Nena Alexander MD

## 2012-06-08 NOTE — Care Management Note (Signed)
    Page 1 of 1   06/08/2012     3:10:33 PM   CARE MANAGEMENT NOTE 06/08/2012  Patient:  Benjamin Murillo, Benjamin Murillo   Account Number:  [id number]  Date Initiated:  06/08/2012  Documentation initiated by:  Letha Cape  Subjective/Objective Assessment:   dx crohns ileitis  admit- from home. pta indep     Action/Plan:   Anticipated DC Date:  06/08/2012   Anticipated DC Plan:  HOME/SELF CARE      DC Planning Services  CM consult      Choice offered to / List presented to:             Status of service:  Completed, signed off Medicare Important Message given?   (If response is "NO", the following Medicare IM given date fields will be blank) Date Medicare IM given:   Date Additional Medicare IM given:    Discharge Disposition:  HOME/SELF CARE  Per UR Regulation:  Reviewed for med. necessity/level of care/duration of stay  If discussed at Long Length of Stay Meetings, dates discussed:    Comments:  06/08/12 15:05 Alcide Evener RN, BSN [phone] patient from home, pta indep.  No needs anticipated. Patient for dc today.

## 2012-06-08 NOTE — Progress Notes (Signed)
NURSING PROGRESS NOTE  Benjamin Murillo [id number] Discharge Data: 06/08/2012 10:32 AM Attending Provider: Maretta Bees, MD WGN:[id number],QMVHQ, MD     Joni Reining to be D/C'd Home per MD order.  Discussed with the patient the After Visit Summary and all questions fully answered. All IV's discontinued with no bleeding noted. All belongings returned to patient for patient to take home.   Last Vital Signs:  Blood pressure 124/74, pulse 53, temperature 98 F (36.7 C), temperature source Oral, resp. rate 18, height 5' 10.87" (1.8 m), weight 56.7 kg (125 lb), SpO2 96.00%.  Discharge Medication List   Medication List    STOP taking these medications       mercaptopurine 50 MG tablet  Commonly known as:  PURINETHOL      TAKE these medications       oxyCODONE 5 MG immediate release tablet  Commonly known as:  Oxy IR/ROXICODONE  Take 1 tablet (5 mg total) by mouth every 6 (six) hours as needed.     predniSONE 10 MG tablet  Commonly known as:  DELTASONE  Take 1 tablet (10 mg total) by mouth daily. Take 4 tablets each morning with breakfast for 1 week (7 days).  Then take 3 tablets each morning with breakfast for 1 week.  Then take 2 tablets each morning with breakfast for 1 week.  Then resume taking 10 mg daily.     promethazine 25 MG tablet  Commonly known as:  PHENERGAN  Take 1 tablet (25 mg total) by mouth every 6 (six) hours as needed for nausea.

## 2012-06-24 ENCOUNTER — Encounter (HOSPITAL_COMMUNITY): Payer: Self-pay

## 2012-06-24 ENCOUNTER — Emergency Department (HOSPITAL_COMMUNITY): Payer: Medicare Other

## 2012-06-24 ENCOUNTER — Emergency Department (HOSPITAL_COMMUNITY)
Admission: EM | Admit: 2012-06-24 | Discharge: 2012-06-24 | Disposition: A | Discharge status: Home / Self Care | Payer: Medicare Other | Attending: Emergency Medicine | Admitting: Emergency Medicine

## 2012-06-24 DIAGNOSIS — R109 Unspecified abdominal pain: Secondary | ICD-10-CM

## 2012-06-24 DIAGNOSIS — Z8739 Personal history of other diseases of the musculoskeletal system and connective tissue: Secondary | ICD-10-CM | POA: Insufficient documentation

## 2012-06-24 DIAGNOSIS — IMO0002 Reserved for concepts with insufficient information to code with codable children: Secondary | ICD-10-CM | POA: Insufficient documentation

## 2012-06-24 DIAGNOSIS — R1084 Generalized abdominal pain: Secondary | ICD-10-CM | POA: Insufficient documentation

## 2012-06-24 DIAGNOSIS — I10 Essential (primary) hypertension: Secondary | ICD-10-CM | POA: Insufficient documentation

## 2012-06-24 DIAGNOSIS — F17200 Nicotine dependence, unspecified, uncomplicated: Secondary | ICD-10-CM | POA: Insufficient documentation

## 2012-06-24 DIAGNOSIS — R112 Nausea with vomiting, unspecified: Secondary | ICD-10-CM | POA: Insufficient documentation

## 2012-06-24 DIAGNOSIS — G8929 Other chronic pain: Secondary | ICD-10-CM | POA: Insufficient documentation

## 2012-06-24 DIAGNOSIS — K5090 Crohn's disease, unspecified, without complications: Secondary | ICD-10-CM | POA: Insufficient documentation

## 2012-06-24 DIAGNOSIS — R197 Diarrhea, unspecified: Secondary | ICD-10-CM | POA: Insufficient documentation

## 2012-06-24 LAB — CBC WITH DIFFERENTIAL/PLATELET
Basophils Relative: 1 % (ref 0–1)
Eosinophils Absolute: 0.1 10*3/uL (ref 0.0–0.7)
HCT: 34.7 % — ABNORMAL LOW (ref 39.0–52.0)
Hemoglobin: 12.4 g/dL — ABNORMAL LOW (ref 13.0–17.0)
Lymphs Abs: 2.3 10*3/uL (ref 0.7–4.0)
MCH: 33.1 pg (ref 26.0–34.0)
MCHC: 35.7 g/dL (ref 30.0–36.0)
MCV: 92.5 fL (ref 78.0–100.0)
Monocytes Absolute: 0.8 10*3/uL (ref 0.1–1.0)
Monocytes Relative: 8 % (ref 3–12)

## 2012-06-24 LAB — URINALYSIS, ROUTINE W REFLEX MICROSCOPIC
Ketones, ur: NEGATIVE mg/dL
Leukocytes, UA: NEGATIVE
Nitrite: NEGATIVE
Urobilinogen, UA: 0.2 mg/dL (ref 0.0–1.0)
pH: 6.5 (ref 5.0–8.0)

## 2012-06-24 LAB — LIPASE, BLOOD: Lipase: 32 U/L (ref 11–59)

## 2012-06-24 LAB — COMPREHENSIVE METABOLIC PANEL
Albumin: 3.5 g/dL (ref 3.5–5.2)
BUN: 8 mg/dL (ref 6–23)
Chloride: 98 mEq/L (ref 96–112)
Creatinine, Ser: 0.87 mg/dL (ref 0.50–1.35)
GFR calc Af Amer: >90 mL/min (ref >90)
Glucose, Bld: 99 mg/dL (ref 70–99)
Total Bilirubin: 0.4 mg/dL (ref 0.3–1.2)
Total Protein: 7 g/dL (ref 6.0–8.3)

## 2012-06-24 MED ORDER — PREDNISONE 20 MG PO TABS
60.0000 mg | ORAL_TABLET | Freq: Once | ORAL | Status: AC
  Administered 2012-06-24: 60 mg via ORAL
  Filled 2012-06-24: qty 3

## 2012-06-24 MED ORDER — ONDANSETRON HCL 4 MG PO TABS: 8.0000 mg | ORAL_TABLET | Freq: Three times a day (TID) | ORAL | Status: DC | PRN

## 2012-06-24 MED ORDER — HYDROMORPHONE HCL PF 1 MG/ML IJ SOLN
1.0000 mg | Freq: Once | INTRAMUSCULAR | Status: AC
  Administered 2012-06-24: 1 mg via INTRAVENOUS
  Filled 2012-06-24: qty 1

## 2012-06-24 MED ORDER — OXYCODONE-ACETAMINOPHEN 5-325 MG PO TABS: 1.0000 | ORAL_TABLET | Freq: Four times a day (QID) | ORAL | Status: DC | PRN

## 2012-06-24 MED ORDER — ONDANSETRON HCL 4 MG/2ML IJ SOLN
4.0000 mg | Freq: Once | INTRAMUSCULAR | Status: AC
  Administered 2012-06-24: 4 mg via INTRAVENOUS
  Filled 2012-06-24: qty 2

## 2012-06-24 MED ORDER — ONDANSETRON 4 MG PO TBDP
8.0000 mg | ORAL_TABLET | Freq: Once | ORAL | Status: AC
  Administered 2012-06-24: 8 mg via ORAL
  Filled 2012-06-24: qty 2

## 2012-06-24 MED ORDER — SODIUM CHLORIDE 0.9 % IV BOLUS (SEPSIS)
2000.0000 mL | Freq: Once | INTRAVENOUS | Status: AC
  Administered 2012-06-24: 1000 mL via INTRAVENOUS

## 2012-06-24 NOTE — ED Provider Notes (Signed)
History     CSN: [id number]  Arrival date & time 06/24/12  1736   First MD Initiated Contact with Patient 06/24/12 1901      Chief Complaint  Patient presents with  . Abdominal Pain    (Consider location/radiation/quality/duration/timing/severity/associated sxs/prior treatment) HPI Complains of diffuse abdominal pain typical of Crohn's disease for one month. He reports he's had no pain relief since discharge the hospital. And he cannot see his gastroenterologist for the next few weeks. He's had 4 episodes of vomiting today at approximately 8 or 9 episodes of diarrhea. No blood per rectum no hematemesis. Nothing makes symptoms better or worse. He admits to diminished appetite. No other associated symptoms. Pain is diffuse. Nonradiating. Past Medical History  Diagnosis Date  . Crohn disease   . Hypertension   . Chronic neck pain   . Chronic abdominal pain   . Cervical radiculopathy     Past Surgical History  Procedure Laterality Date  . Bowel resection      x2  . Colon surgery      History reviewed. No pertinent family history.  History  Substance Use Topics  . Smoking status: Current Every Day Smoker -- 0.10 packs/day for 10 years    Types: Cigarettes  . Smokeless tobacco: Not on file  . Alcohol Use: No      Review of Systems  Constitutional: Positive for appetite change.  HENT: Negative.   Respiratory: Negative.   Cardiovascular: Negative.   Gastrointestinal: Positive for nausea, vomiting, abdominal pain and diarrhea.  Musculoskeletal: Negative.   Skin: Negative.   Neurological: Negative.   Psychiatric/Behavioral: Negative.   All other systems reviewed and are negative.    Allergies  Morphine and related and Tramadol  Home Medications   Current Outpatient Rx  Name  Route  Sig  Dispense  Refill  . mercaptopurine (PURINETHOL) 50 MG tablet   Oral   Take 50 mg by mouth daily. Give on an empty stomach 1 hour before or 2 hours after meals. Caution:  Chemotherapy.         . predniSONE (DELTASONE) 10 MG tablet   Oral   Take 1 tablet (10 mg total) by mouth daily. Take 4 tablets each morning with breakfast for 1 week (7 days).  Then take 3 tablets each morning with breakfast for 1 week.  Then take 2 tablets each morning with breakfast for 1 week.  Then resume taking 10 mg daily.   60 tablet   0     BP 100/58  Pulse 61  Temp(Src) 98.7 F (37.1 C) (Oral)  Resp 20  SpO2 100%  Physical Exam  Nursing note and vitals reviewed. Constitutional: He appears well-developed and well-nourished.  HENT:  Head: Normocephalic and atraumatic.  Eyes: Conjunctivae are normal. Pupils are equal, round, and reactive to light.  Neck: Neck supple. No tracheal deviation present. No thyromegaly present.  Cardiovascular: Normal rate and regular rhythm.   No murmur heard. Pulmonary/Chest: Effort normal and breath sounds normal.  Abdominal: Soft. Bowel sounds are normal. He exhibits no distension and no mass. There is tenderness. There is no rebound and no guarding.  Diffusely tenderness  Genitourinary: Penis normal.  Musculoskeletal: Normal range of motion. He exhibits no edema and no tenderness.  Neurological: He is alert. Coordination normal.  Skin: Skin is warm and dry. No rash noted.  Psychiatric: He has a normal mood and affect.    ED Course  Procedures (including critical care time)  Labs Reviewed  CBC WITH  DIFFERENTIAL - Abnormal; Notable for the following:    RBC 3.75 (*)    Hemoglobin 12.4 (*)    HCT 34.7 (*)    All other components within normal limits  COMPREHENSIVE METABOLIC PANEL - Abnormal; Notable for the following:    Potassium 3.4 (*)    All other components within normal limits  URINALYSIS, ROUTINE W REFLEX MICROSCOPIC - Abnormal; Notable for the following:    Hgb urine dipstick TRACE (*)    All other components within normal limits  LIPASE, BLOOD  URINE MICROSCOPIC-ADD ON  COMPREHENSIVE METABOLIC PANEL  CBC WITH  DIFFERENTIAL   No results found.   No diagnosis found. 10:35 PM pain improved after treatment with IV opioids and Zofran and prednisone orally.  MDM  Plan prescription Percocet, prednisone, Zofran. Patient to call his gastroenterologist tomorrow for followup Suspect pain from Crohn's Diagnosis chronic abdominal pain      Doug Sou, MD 06/24/12 2250

## 2012-06-24 NOTE — ED Notes (Signed)
Pt c/o increased generalized abd pain, nausea, and unable to eat x3 days. Pt reports he was admitted to our facility May 11th for a Crohn's falre up, instructed to f/u with his GI dr, states "they don't have an appt until next month"

## 2012-06-24 NOTE — ED Notes (Signed)
Gave patient a blanket. 

## 2012-07-14 ENCOUNTER — Emergency Department: Payer: Self-pay | Admitting: Emergency Medicine

## 2012-07-14 LAB — COMPREHENSIVE METABOLIC PANEL
Albumin: 3.6 g/dL (ref 3.4–5.0)
BUN: 12 mg/dL (ref 7–18)
Chloride: 103 mmol/L (ref 98–107)
Co2: 25 mmol/L (ref 21–32)
Creatinine: 1.11 mg/dL (ref 0.60–1.30)
EGFR (African American): >60
EGFR (Non-African Amer.): >60
Potassium: 3.3 mmol/L — ABNORMAL LOW (ref 3.5–5.1)
SGOT(AST): 18 U/L (ref 15–37)
Total Protein: 7.4 g/dL (ref 6.4–8.2)

## 2012-07-14 LAB — CBC
HGB: 12.5 g/dL — ABNORMAL LOW (ref 13.0–18.0)
MCH: 32.9 pg (ref 26.0–34.0)
MCHC: 34.6 g/dL (ref 32.0–36.0)
MCV: 95 fL (ref 80–100)
RDW: 13.3 % (ref 11.5–14.5)
WBC: 10.2 10*3/uL (ref 3.8–10.6)

## 2012-07-15 LAB — URINALYSIS, COMPLETE
Bilirubin,UR: NEGATIVE
Blood: NEGATIVE
Leukocyte Esterase: NEGATIVE
Nitrite: NEGATIVE
Ph: 5 (ref 4.5–8.0)
RBC,UR: 9 /HPF (ref 0–5)
Specific Gravity: 1.027 (ref 1.003–1.030)
Squamous Epithelial: <1
WBC UR: 1 /HPF (ref 0–5)

## 2012-10-12 ENCOUNTER — Encounter (HOSPITAL_COMMUNITY): Payer: Self-pay | Admitting: Emergency Medicine

## 2012-10-12 DIAGNOSIS — Z91199 Patient's noncompliance with other medical treatment and regimen due to unspecified reason: Secondary | ICD-10-CM

## 2012-10-12 DIAGNOSIS — M5412 Radiculopathy, cervical region: Secondary | ICD-10-CM | POA: Diagnosis present

## 2012-10-12 DIAGNOSIS — G8929 Other chronic pain: Secondary | ICD-10-CM | POA: Diagnosis present

## 2012-10-12 DIAGNOSIS — K5000 Crohn's disease of small intestine without complications: Principal | ICD-10-CM | POA: Diagnosis present

## 2012-10-12 DIAGNOSIS — F17200 Nicotine dependence, unspecified, uncomplicated: Secondary | ICD-10-CM | POA: Diagnosis present

## 2012-10-12 DIAGNOSIS — Z9119 Patient's noncompliance with other medical treatment and regimen: Secondary | ICD-10-CM

## 2012-10-12 DIAGNOSIS — I10 Essential (primary) hypertension: Secondary | ICD-10-CM | POA: Diagnosis present

## 2012-10-12 LAB — CBC WITH DIFFERENTIAL/PLATELET
Basophils Absolute: 0 10*3/uL (ref 0.0–0.1)
Basophils Relative: 0 % (ref 0–1)
Eosinophils Absolute: 0.1 10*3/uL (ref 0.0–0.7)
Eosinophils Relative: 1 % (ref 0–5)
HCT: 33.9 % — ABNORMAL LOW (ref 39.0–52.0)
Lymphocytes Relative: 25 % (ref 12–46)
MCH: 34.5 pg — ABNORMAL HIGH (ref 26.0–34.0)
MCHC: 35.7 g/dL (ref 30.0–36.0)
MCV: 96.6 fL (ref 78.0–100.0)
Monocytes Absolute: 0.7 10*3/uL (ref 0.1–1.0)
Platelets: 267 10*3/uL (ref 150–400)
RDW: 16.8 % — ABNORMAL HIGH (ref 11.5–15.5)
WBC: 9.2 10*3/uL (ref 4.0–10.5)

## 2012-10-12 LAB — COMPREHENSIVE METABOLIC PANEL
ALT: 8 U/L (ref 0–53)
AST: 16 U/L (ref 0–37)
Albumin: 3.9 g/dL (ref 3.5–5.2)
Calcium: 9.7 mg/dL (ref 8.4–10.5)
Creatinine, Ser: 0.93 mg/dL (ref 0.50–1.35)
GFR calc non Af Amer: >90 mL/min (ref >90)
Sodium: 138 mEq/L (ref 135–145)
Total Protein: 7.2 g/dL (ref 6.0–8.3)

## 2012-10-12 NOTE — ED Notes (Signed)
Pt. reports Crohn's flare up with upper abdominal pain , nausea and vomitting / diarrhea and low grade fever for past several days .

## 2012-10-13 ENCOUNTER — Emergency Department (HOSPITAL_COMMUNITY): Payer: Medicare Other

## 2012-10-13 ENCOUNTER — Encounter (HOSPITAL_COMMUNITY): Payer: Self-pay | Admitting: Radiology

## 2012-10-13 ENCOUNTER — Inpatient Hospital Stay (HOSPITAL_COMMUNITY)
Admission: EM | Admit: 2012-10-13 | Discharge: 2012-10-15 | DRG: 387 | Disposition: A | Discharge status: Home / Self Care | Payer: Medicare Other | Attending: Internal Medicine | Admitting: Internal Medicine

## 2012-10-13 DIAGNOSIS — R109 Unspecified abdominal pain: Secondary | ICD-10-CM

## 2012-10-13 DIAGNOSIS — Z9119 Patient's noncompliance with other medical treatment and regimen: Secondary | ICD-10-CM

## 2012-10-13 DIAGNOSIS — K5000 Crohn's disease of small intestine without complications: Principal | ICD-10-CM | POA: Diagnosis present

## 2012-10-13 DIAGNOSIS — Z720 Tobacco use: Secondary | ICD-10-CM | POA: Diagnosis present

## 2012-10-13 DIAGNOSIS — G8929 Other chronic pain: Secondary | ICD-10-CM | POA: Diagnosis present

## 2012-10-13 DIAGNOSIS — R52 Pain, unspecified: Secondary | ICD-10-CM

## 2012-10-13 DIAGNOSIS — Z91199 Patient's noncompliance with other medical treatment and regimen due to unspecified reason: Secondary | ICD-10-CM

## 2012-10-13 HISTORY — DX: Shortness of breath: R06.02

## 2012-10-13 LAB — URINALYSIS, ROUTINE W REFLEX MICROSCOPIC
Bilirubin Urine: NEGATIVE
Hgb urine dipstick: NEGATIVE
Specific Gravity, Urine: 1.013 (ref 1.005–1.030)
Urobilinogen, UA: 0.2 mg/dL (ref 0.0–1.0)
pH: 6.5 (ref 5.0–8.0)

## 2012-10-13 LAB — CBC
Hemoglobin: 11.8 g/dL — ABNORMAL LOW (ref 13.0–17.0)
MCHC: 35.2 g/dL (ref 30.0–36.0)
RBC: 3.43 MIL/uL — ABNORMAL LOW (ref 4.22–5.81)

## 2012-10-13 LAB — CREATININE, SERUM
Creatinine, Ser: 0.84 mg/dL (ref 0.50–1.35)
GFR calc non Af Amer: >90 mL/min (ref >90)

## 2012-10-13 LAB — SEDIMENTATION RATE: Sed Rate: 10 mm/hr (ref 0–16)

## 2012-10-13 LAB — C-REACTIVE PROTEIN: CRP: <0.5 mg/dL — ABNORMAL LOW (ref <0.60)

## 2012-10-13 LAB — LACTIC ACID, PLASMA: Lactic Acid, Venous: 0.7 mmol/L (ref 0.5–2.2)

## 2012-10-13 MED ORDER — ALBUTEROL SULFATE (5 MG/ML) 0.5% IN NEBU: 2.5000 mg | INHALATION_SOLUTION | RESPIRATORY_TRACT | Status: DC | PRN

## 2012-10-13 MED ORDER — SODIUM CHLORIDE 0.9 % IV SOLN
INTRAVENOUS | Status: AC
  Administered 2012-10-13: 08:00:00 via INTRAVENOUS

## 2012-10-13 MED ORDER — HYDROCODONE-ACETAMINOPHEN 5-325 MG PO TABS
1.0000 | ORAL_TABLET | ORAL | Status: DC | PRN
  Administered 2012-10-13 – 2012-10-14 (×5): 2 via ORAL
  Administered 2012-10-15: 1 via ORAL
  Administered 2012-10-15 (×3): 2 via ORAL
  Filled 2012-10-13 (×9): qty 2

## 2012-10-13 MED ORDER — ONDANSETRON HCL 4 MG/2ML IJ SOLN
4.0000 mg | Freq: Once | INTRAMUSCULAR | Status: AC
  Administered 2012-10-13: 4 mg via INTRAVENOUS
  Filled 2012-10-13: qty 2

## 2012-10-13 MED ORDER — MORPHINE SULFATE 4 MG/ML IJ SOLN
4.0000 mg | Freq: Once | INTRAMUSCULAR | Status: AC
  Administered 2012-10-13: 4 mg via INTRAVENOUS
  Filled 2012-10-13: qty 1

## 2012-10-13 MED ORDER — ONDANSETRON HCL 4 MG PO TABS: 4.0000 mg | ORAL_TABLET | Freq: Four times a day (QID) | ORAL | Status: DC | PRN

## 2012-10-13 MED ORDER — CIPROFLOXACIN IN D5W 400 MG/200ML IV SOLN
400.0000 mg | Freq: Two times a day (BID) | INTRAVENOUS | Status: DC
  Administered 2012-10-13 – 2012-10-15 (×5): 400 mg via INTRAVENOUS
  Filled 2012-10-13 (×6): qty 200

## 2012-10-13 MED ORDER — METHYLPREDNISOLONE SODIUM SUCC 125 MG IJ SOLR
60.0000 mg | Freq: Every day | INTRAMUSCULAR | Status: DC
  Administered 2012-10-13 – 2012-10-15 (×3): 60 mg via INTRAVENOUS
  Filled 2012-10-13: qty 0.96
  Filled 2012-10-13 (×2): qty 2
  Filled 2012-10-13: qty 0.96

## 2012-10-13 MED ORDER — MORPHINE SULFATE 2 MG/ML IJ SOLN
2.0000 mg | INTRAMUSCULAR | Status: DC | PRN
  Administered 2012-10-13 – 2012-10-14 (×7): 2 mg via INTRAVENOUS
  Filled 2012-10-13 (×7): qty 1

## 2012-10-13 MED ORDER — ONDANSETRON HCL 4 MG/2ML IJ SOLN
4.0000 mg | Freq: Four times a day (QID) | INTRAMUSCULAR | Status: DC | PRN
  Administered 2012-10-13 – 2012-10-14 (×4): 4 mg via INTRAVENOUS
  Filled 2012-10-13 (×4): qty 2

## 2012-10-13 MED ORDER — METRONIDAZOLE IN NACL 5-0.79 MG/ML-% IV SOLN
500.0000 mg | Freq: Three times a day (TID) | INTRAVENOUS | Status: DC
  Administered 2012-10-13 – 2012-10-15 (×7): 500 mg via INTRAVENOUS
  Filled 2012-10-13 (×9): qty 100

## 2012-10-13 MED ORDER — IOHEXOL 300 MG/ML  SOLN
100.0000 mL | Freq: Once | INTRAMUSCULAR | Status: AC | PRN
  Administered 2012-10-13: 100 mL via INTRAVENOUS

## 2012-10-13 MED ORDER — GUAIFENESIN-DM 100-10 MG/5ML PO SYRP: 5.0000 mL | ORAL_SOLUTION | ORAL | Status: DC | PRN

## 2012-10-13 MED ORDER — HEPARIN SODIUM (PORCINE) 5000 UNIT/ML IJ SOLN
5000.0000 [IU] | Freq: Three times a day (TID) | INTRAMUSCULAR | Status: DC
  Administered 2012-10-13 – 2012-10-15 (×7): 5000 [IU] via SUBCUTANEOUS
  Filled 2012-10-13 (×10): qty 1

## 2012-10-13 MED ORDER — MERCAPTOPURINE 50 MG PO TABS
50.0000 mg | ORAL_TABLET | Freq: Every day | ORAL | Status: DC
  Administered 2012-10-13 – 2012-10-15 (×3): 50 mg via ORAL
  Filled 2012-10-13 (×3): qty 1

## 2012-10-13 MED ORDER — SODIUM CHLORIDE 0.9 % IV BOLUS (SEPSIS)
1000.0000 mL | Freq: Once | INTRAVENOUS | Status: AC
  Administered 2012-10-13: 1000 mL via INTRAVENOUS

## 2012-10-13 NOTE — ED Notes (Signed)
Awaiting clean bed status. Pt will be transported when room is ready.

## 2012-10-13 NOTE — Progress Notes (Signed)
Admitted to room 6n10 from ED. A/Ox4, denies pain at present time, c/o nausea, oriented to room and surroundings

## 2012-10-13 NOTE — H&P (Signed)
[facility]                                                                                    Patient Demographics  Benjamin Murillo, is a 42 y.o. male  MRN: [id number]   DOB - 1970/04/29  Admit Date - 10/13/2012  Outpatient Primary MD for the patient is [name],[name], MD   With History of -  Past Medical History  Diagnosis Date  . Crohn disease   . Hypertension   . Chronic neck pain   . Chronic abdominal pain   . Cervical radiculopathy       Past Surgical History  Procedure Laterality Date  . Bowel resection      x2  . Colon surgery      in for   Chief Complaint  Patient presents with  . Crohn's Disease     HPI  Benjamin Murillo  is a 42 y.o. male, with H/O crohn's ileitis, non complaint with Meds, Smoker who ran out of his Crohn's Meds few days ago, comes in with Acute on Chr RLQ Abd pain and some diarrhea with mucous for the last 1-2 days, no fever chills, denies any bad food items, no other contacts are sick at home at present, no Nausea, in ER Labs stable, CT confirms crohn's ileitis, I was called to admit him for Crohn's flare and Abd pain.  Review of Systems    In addition to the HPI above,   No Fever-chills, No Headache, No changes with Vision or hearing, No problems swallowing food or Liquids, No Chest pain, Cough or Shortness of Breath, +  Abdominal pain, No Nausea or Vommitting, Bowel movements are loose, no blood in stool No Blood in stool or Urine, No dysuria, No new skin rashes or bruises, No new joints pains-aches,  No new weakness, tingling, numbness in any extremity, No recent weight gain or loss, No polyuria, polydypsia or polyphagia, No significant Mental Stressors.  A full 10 point Review of Systems was done, except as stated above, all other Review of Systems were negative.   Social History History  Substance Use Topics  . Smoking status: Current Every Day Smoker -- 0.10 packs/day for 10 years    Types: Cigarettes  . Smokeless  tobacco: Not on file  . Alcohol Use: No      Family History No IBD  Prior to Admission medications   Medication Sig Start Date End Date Taking? Authorizing Provider  mercaptopurine (PURINETHOL) 50 MG tablet Take 50 mg by mouth daily. Give on an empty stomach 1 hour before or 2 hours after meals. Caution: Chemotherapy.   Yes Historical Provider, MD  predniSONE (DELTASONE) 10 MG tablet Take 10 mg by mouth daily.   Yes Historical Provider, MD    Allergies  Allergen Reactions  . Morphine And Related Itching    High doses  . Tramadol Other (See Comments)    Makes patient anxious and nervous.    Physical Exam  Vitals  Blood pressure 139/87, pulse 68, temperature 98.5 F (36.9 C), temperature source Oral, resp. rate 16, height 5' 11"  (1.803 m), weight 54.432 kg (120 lb), SpO2 100.00%.   1. General  middle aged AA male, thin,  lying in bed in NAD,     2. Normal affect and insight, Not Suicidal or Homicidal, Awake Alert, Oriented X 3.  3. No F.N deficits, ALL C.Nerves Intact, Strength 5/5 all 4 extremities, Sensation intact all 4 extremities, Plantars down going.  4. Ears and Eyes appear Normal, Conjunctivae clear, PERRLA. Moist Oral Mucosa.  5. Supple Neck, No JVD, No cervical lymphadenopathy appriciated, No Carotid Bruits.  6. Symmetrical Chest wall movement, Good air movement bilaterally, CTAB.  7. RRR, No Gallops, Rubs or Murmurs, No Parasternal Heave.  8. Positive Bowel Sounds, Abdomen Soft, mild RLQ tenderness , No organomegaly appriciated,No rebound -guarding or rigidity.  9.  No Cyanosis, Normal Skin Turgor, No Skin Rash or Bruise.  10. Good muscle tone,  joints appear normal , no effusions, Normal ROM.  11. No Palpable Lymph Nodes in Neck or Axillae     Data Review  CBC  Recent Labs Lab 10/12/12 2020  WBC 9.2  HGB 12.1*  HCT 33.9*  PLT 267  MCV 96.6  MCH 34.5*  MCHC 35.7  RDW 16.8*  LYMPHSABS 2.3  MONOABS 0.7  EOSABS 0.1  BASOSABS 0.0    ------------------------------------------------------------------------------------------------------------------  Chemistries   Recent Labs Lab 10/12/12 2020  NA 138  K 3.7  CL 103  CO2 28  GLUCOSE 99  BUN 7  CREATININE 0.93  CALCIUM 9.7  AST 16  ALT 8  ALKPHOS 78  BILITOT 0.3   ------------------------------------------------------------------------------------------------------------------ estimated creatinine clearance is 81.2 ml/min (by C-G formula based on Cr of 0.93). ------------------------------------------------------------------------------------------------------------------ No results found for this basename: TSH, T4TOTAL, FREET3, T3FREE, THYROIDAB,  in the last 72 hours   Coagulation profile No results found for this basename: INR, PROTIME,  in the last 168 hours ------------------------------------------------------------------------------------------------------------------- No results found for this basename: DDIMER,  in the last 72 hours -------------------------------------------------------------------------------------------------------------------  Cardiac Enzymes No results found for this basename: CK, CKMB, TROPONINI, MYOGLOBIN,  in the last 168 hours ------------------------------------------------------------------------------------------------------------------ No components found with this basename: POCBNP,    ---------------------------------------------------------------------------------------------------------------  Urinalysis    Component Value Date/Time   COLORURINE YELLOW 06/24/2012 1809   APPEARANCEUR CLEAR 06/24/2012 1809   LABSPEC 1.022 06/24/2012 1809   PHURINE 6.5 06/24/2012 1809   GLUCOSEU NEGATIVE 06/24/2012 1809   HGBUR TRACE* 06/24/2012 [place] 06/24/2012 1809   KETONESUR NEGATIVE 06/24/2012 1809   PROTEINUR NEGATIVE 06/24/2012 1809   UROBILINOGEN 0.2 06/24/2012 1809   NITRITE NEGATIVE 06/24/2012 1809    LEUKOCYTESUR NEGATIVE 06/24/2012 1809    ----------------------------------------------------------------------------------------------------------------  Imaging results:   Ct Abdomen Pelvis W Contrast  10/13/2012   CLINICAL DATA:  Crohn's disease and abdominal pain. Question abscess.  EXAM: CT ABDOMEN AND PELVIS WITH CONTRAST  TECHNIQUE: Multidetector CT imaging of the abdomen and pelvis was performed using the standard protocol following bolus administration of intravenous contrast.  CONTRAST:  156m OMNIPAQUE IOHEXOL 300 MG/ML  SOLN  COMPARISON:  06/06/2012.  FINDINGS: BODY WALL: Imaged portions of a posterior and lower right chest lipoma are simple.  LOWER CHEST:  Mediastinum: Unremarkable.  Lungs/pleura: No consolidation.  ABDOMEN/PELVIS:  Liver: No suspicious abnormality.  Biliary: No evidence of biliary obstruction or stone.  Pancreas: Unremarkable.  Spleen: Unremarkable.  Adrenals: Unremarkable.  Kidneys and ureters: No hydronephrosis or stone. Tiny cortical low-attenuation foci in the bilateral kidney, too small to characterize.  Bladder: Unremarkable.  Bowel: Circumferential thickening and mild relative narrowing of the terminal ileum. The bowel proximal to this point is mildly distended, but  there is no impediment to contrast passage and no tight focal stricture. No abscess or fistula. Appendectomy  Retroperitoneum: No mass or adenopathy.  Peritoneum: No free fluid or gas.  Reproductive: Unremarkable.  Vascular: No acute abnormality.  OSSEOUS: No sacroiliitis or evidence of osteonecrosis.  IMPRESSION: Terminal ileitis. No complicating abscess, obstruction, or fistula.   Electronically Signed   By: Jorje Guild   On: 10/13/2012 05:50        Assessment & Plan  1. Acute on chronic abdominal pain due to Mild exacerbation of Crohn's ileitis - Patient noncompliant with medications and GI followups [facility]), admit, clear liquids, IV Cipro-flagyl and steroids, continue home dose 6MP, monitor,  likely will be back to baseline inj 24-48hrs, if not call GI. Counseled on medication and follow up compliance.      2.History of smoking- counseled smoking    DVT Prophylaxis Heparin   AM Labs Ordered, also please review Full Orders  Family Communication: Admission, patients condition and plan of care including tests being ordered have been discussed with the patient   who indicates understanding and agree with the plan and Code Status.  Code Status full  Likely DC to  home  Condition fair  Time spent in minutes : 35    Lala Lund K M.D on 10/13/2012 at 7:31 AM  Between 7am to 7pm - Pager - [phone]  After 7pm go to [email or web address] - password [email or web address]  And look for the night coverage person covering me after hours  [facility] Office  [phone]

## 2012-10-13 NOTE — ED Provider Notes (Signed)
CSN: [id number]     Arrival date & time 10/12/12  1954 History   First MD Initiated Contact with Patient 10/13/12 0025     Chief Complaint  Patient presents with  . Crohn's Disease   (Consider location/radiation/quality/duration/timing/severity/associated sxs/prior Treatment) Patient is a 42 y.o. male presenting with abdominal pain. The history is provided by the patient.  Abdominal Pain Pain location:  Generalized Pain quality: aching and sharp   Pain quality: not heavy and no pressure   Pain radiates to:  Does not radiate Pain severity:  Moderate Onset quality:  Gradual Duration:  4 days Timing:  Constant Progression:  Worsening Chronicity:  Recurrent Context comment:  Hx of Crohn's Relieved by:  Nothing Worsened by:  Nothing tried Associated symptoms: anorexia (nothing to eat in 4 days), diarrhea, fever (Tmax 101) and vomiting   Associated symptoms: no cough and no shortness of breath     Past Medical History  Diagnosis Date  . Crohn disease   . Hypertension   . Chronic neck pain   . Chronic abdominal pain   . Cervical radiculopathy    Past Surgical History  Procedure Laterality Date  . Bowel resection      x2  . Colon surgery     No family history on file. History  Substance Use Topics  . Smoking status: Current Every Day Smoker -- 0.10 packs/day for 10 years    Types: Cigarettes  . Smokeless tobacco: Not on file  . Alcohol Use: No    Review of Systems  Constitutional: Positive for fever (Tmax 101).  Respiratory: Negative for cough and shortness of breath.   Gastrointestinal: Positive for vomiting, abdominal pain, diarrhea and anorexia (nothing to eat in 4 days).  All other systems reviewed and are negative.    Allergies  Morphine and related and Tramadol  Home Medications   Current Outpatient Rx  Name  Route  Sig  Dispense  Refill  . mercaptopurine (PURINETHOL) 50 MG tablet   Oral   Take 50 mg by mouth daily. Give on an empty stomach 1 hour  before or 2 hours after meals. Caution: Chemotherapy.         . predniSONE (DELTASONE) 10 MG tablet   Oral   Take 10 mg by mouth daily.          BP 144/89  Pulse 71  Temp(Src) 98.5 F (36.9 C) (Oral)  Resp 16  Ht 5\' 11"  (1.803 m)  Wt 120 lb (54.432 kg)  BMI 16.74 kg/m2  SpO2 98% Physical Exam  Nursing note and vitals reviewed. Constitutional: He is oriented to person, place, and time. He appears well-developed and well-nourished. No distress.  HENT:  Head: Normocephalic and atraumatic.  Mouth/Throat: No oropharyngeal exudate.  Eyes: EOM are normal. Pupils are equal, round, and reactive to light.  Neck: Normal range of motion. Neck supple.  Cardiovascular: Normal rate and regular rhythm.  Exam reveals no friction rub.   No murmur heard. Pulmonary/Chest: Effort normal and breath sounds normal. No respiratory distress. He has no wheezes. He has no rales.  Abdominal: He exhibits no distension. There is tenderness (lower abdomen). There is no rebound.  Musculoskeletal: Normal range of motion. He exhibits no edema.  Neurological: He is alert and oriented to person, place, and time. No cranial nerve deficit. He exhibits normal muscle tone. Coordination normal.  Skin: He is not diaphoretic.    ED Course  Procedures (including critical care time) Labs Review Labs Reviewed  CBC WITH DIFFERENTIAL -  Abnormal; Notable for the following:    RBC 3.51 (*)    Hemoglobin 12.1 (*)    HCT 33.9 (*)    MCH 34.5 (*)    RDW 16.8 (*)    All other components within normal limits  COMPREHENSIVE METABOLIC PANEL  URINALYSIS, ROUTINE W REFLEX MICROSCOPIC  C-REACTIVE PROTEIN  SEDIMENTATION RATE  LACTIC ACID, PLASMA   Imaging Review Ct Abdomen Pelvis W Contrast  10/13/2012   CLINICAL DATA:  Crohn's disease and abdominal pain. Question abscess.  EXAM: CT ABDOMEN AND PELVIS WITH CONTRAST  TECHNIQUE: Multidetector CT imaging of the abdomen and pelvis was performed using the standard protocol  following bolus administration of intravenous contrast.  CONTRAST:  OMNIPAQUE IOHEXOL 300 MG/ML  SOLN  COMPARISON:  06/06/2012.  FINDINGS: BODY WALL: Imaged portions of a posterior and lower right chest lipoma are simple.  LOWER CHEST:  Mediastinum: Unremarkable.  Lungs/pleura: No consolidation.  ABDOMEN/PELVIS:  Liver: No suspicious abnormality.  Biliary: No evidence of biliary obstruction or stone.  Pancreas: Unremarkable.  Spleen: Unremarkable.  Adrenals: Unremarkable.  Kidneys and ureters: No hydronephrosis or stone. Tiny cortical low-attenuation foci in the bilateral kidney, too small to characterize.  Bladder: Unremarkable.  Bowel: Circumferential thickening and mild relative narrowing of the terminal ileum. The bowel proximal to this point is mildly distended, but there is no impediment to contrast passage and no tight focal stricture. No abscess or fistula. Appendectomy  Retroperitoneum: No mass or adenopathy.  Peritoneum: No free fluid or gas.  Reproductive: Unremarkable.  Vascular: No acute abnormality.  OSSEOUS: No sacroiliitis or evidence of osteonecrosis.  IMPRESSION: Terminal ileitis. No complicating abscess, obstruction, or fistula.   Electronically Signed   By: Tiburcio Pea   On: 10/13/2012 05:50    MDM   1. Chronic abdominal pain   2. Acute abdominal pain   3. Crohn's ileitis   4. Terminal ileitis   5. Abdominal pain     42 year old male with history of Crohn's disease and multiple bowel resections presents with abdominal pain. For his abdominal pain with associated fever up to 101, vomiting, diarrhea. States it does feel like last time he had an abscess. He also states some associated night sweats. On exam, vitals are stable here. He does appear overtly dehydrated. He is mild lower abdominal tenderness diffusely in the lower abdomen. No masses, guarding, rebound. CT scan of the abdomen check labs. Initial labs show normal lites and LFTs. No leukocytosis. CT scan shows  terminal ileitis. Unable to control pain with IV meds. I consulted medicine about this patient and they agreed to admit. I have reviewed all labs and imaging and considered them in my medical decision making.   Dagmar Hait, MD 10/13/12 [phone]

## 2012-10-13 NOTE — ED Notes (Signed)
Floor unable to take report at the time. To call back. 

## 2012-10-13 NOTE — ED Notes (Signed)
Report given to Monica, RN.

## 2012-10-14 DIAGNOSIS — Z9119 Patient's noncompliance with other medical treatment and regimen: Secondary | ICD-10-CM

## 2012-10-14 DIAGNOSIS — Z720 Tobacco use: Secondary | ICD-10-CM | POA: Diagnosis present

## 2012-10-14 DIAGNOSIS — F17200 Nicotine dependence, unspecified, uncomplicated: Secondary | ICD-10-CM

## 2012-10-14 LAB — CBC
Hemoglobin: 10.7 g/dL — ABNORMAL LOW (ref 13.0–17.0)
MCHC: 36.8 g/dL — ABNORMAL HIGH (ref 30.0–36.0)
Platelets: 220 10*3/uL (ref 150–400)
RDW: 15.9 % — ABNORMAL HIGH (ref 11.5–15.5)

## 2012-10-14 LAB — BASIC METABOLIC PANEL
BUN: 6 mg/dL (ref 6–23)
Calcium: 8.3 mg/dL — ABNORMAL LOW (ref 8.4–10.5)
GFR calc Af Amer: >90 mL/min (ref >90)
GFR calc non Af Amer: >90 mL/min (ref >90)
Potassium: 3.6 mEq/L (ref 3.5–5.1)

## 2012-10-14 MED ORDER — ONDANSETRON HCL 4 MG PO TABS
4.0000 mg | ORAL_TABLET | ORAL | Status: DC | PRN
  Administered 2012-10-15 (×2): 4 mg via ORAL
  Filled 2012-10-14 (×2): qty 1

## 2012-10-14 MED ORDER — BOOST / RESOURCE BREEZE PO LIQD
1.0000 | Freq: Three times a day (TID) | ORAL | Status: DC
  Administered 2012-10-14 – 2012-10-15 (×3): 1 via ORAL

## 2012-10-14 MED ORDER — ONDANSETRON HCL 4 MG/2ML IJ SOLN
4.0000 mg | INTRAMUSCULAR | Status: DC | PRN
  Administered 2012-10-14 – 2012-10-15 (×4): 4 mg via INTRAVENOUS
  Filled 2012-10-14 (×4): qty 2

## 2012-10-14 MED ORDER — PROMETHAZINE HCL 25 MG/ML IJ SOLN
12.5000 mg | Freq: Four times a day (QID) | INTRAMUSCULAR | Status: DC | PRN
  Administered 2012-10-14 (×2): 12.5 mg via INTRAVENOUS
  Filled 2012-10-14 (×2): qty 1

## 2012-10-14 NOTE — Progress Notes (Signed)
INITIAL NUTRITION ASSESSMENT  DOCUMENTATION CODES Per approved criteria  -Underweight   INTERVENTION: Diet advancement per MD discretion Provide Resource Breeze TID  NUTRITION DIAGNOSIS: Increased nutrient needs related to underweight/crohn disease as evidenced by BMI of 16.7.   Goal: Pt to meet >/= 90% of their estimated nutrition needs   Monitor:  PO intake Diet advancement Weight Labs  Reason for Assessment: Malnutrition Screening Tool, score of 2  42 y.o. male  Admitting Dx: Crohn's ileitis  ASSESSMENT: 42 y.o. male, with H/O crohn's ileitis, non complaint with Meds, Smoker who ran out of his Crohn's Meds few days ago, comes in with Acute on Chr RLQ Abd pain and some diarrhea with mucous for the last 1-2 days. Admitted for Crohn's flare and Abd pain.  Pt reports having a poor appetite for the past few days due to nausea and pain. Pt states he usually weighs 135 lbs and was eating well prior to these past few days. He states he still has a poor appetite today and is upset that he isn't allowed anything to eat other than clear liquids.   Height: Ht Readings from Last 1 Encounters:  10/12/12 5\' 11"  (1.803 m)    Weight: Wt Readings from Last 1 Encounters:  10/14/12 119 lb 11.4 oz (54.3 kg)    Ideal Body Weight: 172 lbs  % Ideal Body Weight: 69%  Wt Readings from Last 10 Encounters:  10/14/12 119 lb 11.4 oz (54.3 kg)  06/06/12 125 lb (56.7 kg)  03/05/12 125 lb (56.7 kg)  02/27/12 125 lb (56.7 kg)  12/30/11 126 lb (57.153 kg)  09/27/11 120 lb (54.432 kg)  07/24/11 120 lb (54.432 kg)  07/19/11 119 lb 14.9 oz (54.4 kg)    Usual Body Weight: 135 lbs  % Usual Body Weight: 88%  BMI:  Body mass index is 16.7 kg/(m^2).  Estimated Nutritional Needs: Kcal: 1900-2100 Protein: 65-75 grams Fluid: 1.9-2.1 L/day  Skin: WDL  Diet Order: Clear Liquid  EDUCATION NEEDS: -No education needs identified at this time   Intake/Output Summary (Last 24 hours) at  10/14/12 1147 Last data filed at 10/14/12 0939  Gross per 24 hour  Intake    360 ml  Output      0 ml  Net    360 ml    Last BM: 9/15   Labs:   Recent Labs Lab 10/12/12 2020 10/13/12 1130 10/14/12 0837  NA 138  --  131*  K 3.7  --  3.6  CL 103  --  98  CO2 28  --  23  BUN 7  --  6  CREATININE 0.93 0.84 0.79  CALCIUM 9.7  --  8.3*  GLUCOSE 99  --  83    CBG (last 3)  No results found for this basename: GLUCAP,  in the last 72 hours  Scheduled Meds: . ciprofloxacin  400 mg Intravenous Q12H  . heparin  5,000 Units Subcutaneous Q8H  . mercaptopurine  50 mg Oral Daily  . methylPREDNISolone (SOLU-MEDROL) injection  60 mg Intravenous Daily  . metronidazole  500 mg Intravenous Q8H    Continuous Infusions: . sodium chloride 100 mL/hr at 10/13/12 0745    Past Medical History  Diagnosis Date  . Crohn disease   . Hypertension   . Chronic neck pain   . Chronic abdominal pain   . Cervical radiculopathy   . Shortness of breath     "sometimes laying down; sometimes w/activity" (10/13/2012)    Past Surgical History  Procedure Laterality Date  . Bowel resection      x2  . Colon surgery      Ian Malkin RD, LDN Inpatient Clinical Dietitian Pager: [phone] After Hours Pager: [phone]

## 2012-10-14 NOTE — Progress Notes (Signed)
Chart reviewed.  [facility] PROGRESS NOTE  KALEM ROCKWELL JYN:[id number] DOB: 1970-12-30 DOA: 10/13/2012 PCP: Corky Downs, MD  Assessment/Plan:    Crohn's ileitis: diarrhea resolved, but still with significant abdominal pain and nausea.  No longer followed by [facility], as unable to make appointments.  Does not drive.  Requesting referral to local gastroenterologist.  Continue clears, steroids, abx. Active Problems:   Tobacco abuse:  Does not want patch. Counseled against.   Noncompliance   Chronic abdominal pain per chart  Code Status: full Family Communication: none at bedside Disposition Plan: home  Consultants:    Procedures:    Antibiotics:  cipro 9/16  Flagyl 9/16  HPI/Subjective: No diarrhea.  Pain severe. Nausea continues. Requesting more frequent antiemetics. Not taking much po  Objective: Filed Vitals:   10/14/12 0512  BP: 126/73  Pulse: 52  Temp: 97.9 F (36.6 C)  Resp: 17    Intake/Output Summary (Last 24 hours) at 10/14/12 0856 Last data filed at 10/13/12 1840  Gross per 24 hour  Intake    240 ml  Output      0 ml  Net    240 ml   [facility] Weights   10/12/12 2008 10/14/12 0512  Weight: 54.432 kg (120 lb) 54.256 kg (119 lb 9.8 oz)    Exam:   General:  Asleep. Arousable.  Thin. cooperative  Cardiovascular: RRR without MGR  Respiratory: CTA without WRR  Abdomen: scaphoid.  BS present.  Soft, minimal tenderness  Ext:  No cce  Skin: multiple scars  Data Reviewed: Basic Metabolic Panel:  Recent Labs Lab 10/12/12 2020 10/13/12 1130  NA 138  --   K 3.7  --   CL 103  --   CO2 28  --   GLUCOSE 99  --   BUN 7  --   CREATININE 0.93 0.84  CALCIUM 9.7  --    Liver Function Tests:  Recent Labs Lab 10/12/12 2020  AST 16  ALT 8  ALKPHOS 78  BILITOT 0.3  PROT 7.2  ALBUMIN 3.9   No results found for this basename: LIPASE, AMYLASE,  in the last 168 hours No results found for this basename: AMMONIA,  in the last 168  hours CBC:  Recent Labs Lab 10/12/12 2020 10/13/12 1130  WBC 9.2 7.5  NEUTROABS 6.1  --   HGB 12.1* 11.8*  HCT 33.9* 33.5*  MCV 96.6 97.7  PLT 267 226    Studies: Ct Abdomen Pelvis W Contrast  10/13/2012   CLINICAL DATA:  Crohn's disease and abdominal pain. Question abscess.  EXAM: CT ABDOMEN AND PELVIS WITH CONTRAST  TECHNIQUE: Multidetector CT imaging of the abdomen and pelvis was performed using the standard protocol following bolus administration of intravenous contrast.  CONTRAST:  OMNIPAQUE IOHEXOL 300 MG/ML  SOLN  COMPARISON:  06/06/2012.  FINDINGS: BODY WALL: Imaged portions of a posterior and lower right chest lipoma are simple.  LOWER CHEST:  Mediastinum: Unremarkable.  Lungs/pleura: No consolidation.  ABDOMEN/PELVIS:  Liver: No suspicious abnormality.  Biliary: No evidence of biliary obstruction or stone.  Pancreas: Unremarkable.  Spleen: Unremarkable.  Adrenals: Unremarkable.  Kidneys and ureters: No hydronephrosis or stone. Tiny cortical low-attenuation foci in the bilateral kidney, too small to characterize.  Bladder: Unremarkable.  Bowel: Circumferential thickening and mild relative narrowing of the terminal ileum. The bowel proximal to this point is mildly distended, but there is no impediment to contrast passage and no tight focal stricture. No abscess or fistula. Appendectomy  Retroperitoneum: No  mass or adenopathy.  Peritoneum: No free fluid or gas.  Reproductive: Unremarkable.  Vascular: No acute abnormality.  OSSEOUS: No sacroiliitis or evidence of osteonecrosis.  IMPRESSION: Terminal ileitis. No complicating abscess, obstruction, or fistula.   Electronically Signed   By: Tiburcio Pea   On: 10/13/2012 05:50    Scheduled Meds: . ciprofloxacin  400 mg Intravenous Q12H  . heparin  5,000 Units Subcutaneous Q8H  . mercaptopurine  50 mg Oral Daily  . methylPREDNISolone (SOLU-MEDROL) injection  60 mg Intravenous Daily  . metronidazole  500 mg Intravenous Q8H    Continuous Infusions: . sodium chloride 100 mL/hr at 10/13/12 0745    Time spent: 35 min  Cassiopeia Florentino L  [facility] Pager [phone]. If 7PM-7AM, please contact night-coverage at [email or web address], password [email or web address] 10/14/2012, 8:56 AM  LOS: 1 day

## 2012-10-15 MED ORDER — PREDNISONE 10 MG PO TABS: 40.0000 mg | ORAL_TABLET | Freq: Every day | ORAL | Status: DC

## 2012-10-15 MED ORDER — PREDNISONE 20 MG PO TABS: 40.0000 mg | ORAL_TABLET | Freq: Every day | ORAL | Status: DC

## 2012-10-15 MED ORDER — PROMETHAZINE HCL 12.5 MG PO TABS: 12.5000 mg | ORAL_TABLET | Freq: Four times a day (QID) | ORAL | Status: DC | PRN

## 2012-10-15 MED ORDER — HYDROCODONE-ACETAMINOPHEN 5-325 MG PO TABS: 1.0000 | ORAL_TABLET | ORAL | Status: DC | PRN

## 2012-10-15 NOTE — Discharge Summary (Signed)
Physician Discharge Summary  Benjamin Murillo WUJ:[id number] DOB: 1970-08-18 DOA: 10/13/2012  PCP: Corky Downs, MD  Admit date: 10/13/2012 Discharge date: 10/15/2012  Time spent: greater than 30 min  Discharge Diagnoses:  Principal Problem:   Crohn's ileitis Active Problems:   Tobacco abuse   Noncompliance   Chronic abdominal pain   Discharge Condition: stable  Filed Weights   10/14/12 0512 10/14/12 1016 10/15/12 0530  Weight: 54.256 kg (119 lb 9.8 oz) 54.3 kg (119 lb 11.4 oz) 54.4 kg (119 lb 14.9 oz)    History of present illness:  Benjamin Murillo is a 42 y.o. male, with H/O crohn's ileitis, non complaint with Meds, Smoker who ran out of his Crohn's Meds few days ago, comes in with Acute on Chr RLQ Abd pain and some diarrhea with mucous for the last 1-2 days, no fever chills, denies any bad food items, no other contacts are sick at home at present, no Nausea, in ER Labs stable, CT confirms crohn's ileitis, I was called to admit him for Crohn's flare and Abd pain.   Hospital Course:   Patient was admitted to hospitalist service, started on steroids, cipro and flagyl, bowel rest, pain and nausea medication.  Patient reports he was fired from [facility] due to no shows.  Diet advanced.  Diarrhea resolved.  Patient requesting discharge.  I asked secretarial staff to arrange f/u with on call GI ([facility]).  Consultations:  none  Discharge Exam: Filed Vitals:   10/15/12 1252  BP: 143/86  Pulse: 52  Temp: 97.9 F (36.6 C)  Resp:     General: comfortable Abd:  Soft, nontender, nondistended.  Bowel sounds present  Discharge Instructions  Discharge Orders   Future Orders Complete By Expires   Activity as tolerated - No restrictions  As directed    Diet general  As directed        Medication List         HYDROcodone-acetaminophen 5-325 MG per tablet  Commonly known as:  NORCO/VICODIN  Take 1-2 tablets by mouth every 4 (four) hours as needed for pain.      mercaptopurine 50 MG tablet  Commonly known as:  PURINETHOL  Take 50 mg by mouth daily. Give on an empty stomach 1 hour before or 2 hours after meals. Caution: Chemotherapy.     predniSONE 10 MG tablet  Commonly known as:  DELTASONE  Take 4 tablets (40 mg total) by mouth daily.     promethazine 12.5 MG tablet  Commonly known as:  PHENERGAN  Take 1 tablet (12.5 mg total) by mouth every 6 (six) hours as needed for nausea.       Allergies  Allergen Reactions  . Morphine And Related Itching    High doses  . Tramadol Other (See Comments)    Makes patient anxious and nervous.       Follow-up Information   Follow up with [name],[name], MD In 1 week.   Specialty:  Internal Medicine   Contact information:   [address] [place] [address] [phone]       Schedule an appointment as soon as possible for a visit with [facility].   Specialty:  Gastroenterology   Contact information:   [address] [place] [address] [phone]       The results of significant diagnostics from this hospitalization (including imaging, microbiology, ancillary and laboratory) are listed below for reference.    Significant Diagnostic Studies: Ct Abdomen Pelvis  W Contrast  10/13/2012   CLINICAL DATA:  Crohn's disease and abdominal pain. Question abscess.  EXAM: CT ABDOMEN AND PELVIS WITH CONTRAST  TECHNIQUE: Multidetector CT imaging of the abdomen and pelvis was performed using the standard protocol following bolus administration of intravenous contrast.  CONTRAST:  OMNIPAQUE IOHEXOL 300 MG/ML  SOLN  COMPARISON:  06/06/2012.  FINDINGS: BODY WALL: Imaged portions of a posterior and lower right chest lipoma are simple.  LOWER CHEST:  Mediastinum: Unremarkable.  Lungs/pleura: No consolidation.  ABDOMEN/PELVIS:  Liver: No suspicious abnormality.  Biliary: No evidence of biliary obstruction or stone.  Pancreas: Unremarkable.  Spleen:  Unremarkable.  Adrenals: Unremarkable.  Kidneys and ureters: No hydronephrosis or stone. Tiny cortical low-attenuation foci in the bilateral kidney, too small to characterize.  Bladder: Unremarkable.  Bowel: Circumferential thickening and mild relative narrowing of the terminal ileum. The bowel proximal to this point is mildly distended, but there is no impediment to contrast passage and no tight focal stricture. No abscess or fistula. Appendectomy  Retroperitoneum: No mass or adenopathy.  Peritoneum: No free fluid or gas.  Reproductive: Unremarkable.  Vascular: No acute abnormality.  OSSEOUS: No sacroiliitis or evidence of osteonecrosis.  IMPRESSION: Terminal ileitis. No complicating abscess, obstruction, or fistula.   Electronically Signed   By: Tiburcio Pea   On: 10/13/2012 05:50    Microbiology: No results found for this or any previous visit (from the past 240 hour(s)).   Labs: Basic Metabolic Panel:  Recent Labs Lab 10/12/12 2020 10/13/12 1130 10/14/12 0837  NA 138  --  131*  K 3.7  --  3.6  CL 103  --  98  CO2 28  --  23  GLUCOSE 99  --  83  BUN 7  --  6  CREATININE 0.93 0.84 0.79  CALCIUM 9.7  --  8.3*   Liver Function Tests:  Recent Labs Lab 10/12/12 2020  AST 16  ALT 8  ALKPHOS 78  BILITOT 0.3  PROT 7.2  ALBUMIN 3.9   No results found for this basename: LIPASE, AMYLASE,  in the last 168 hours No results found for this basename: AMMONIA,  in the last 168 hours CBC:  Recent Labs Lab 10/12/12 2020 10/13/12 1130 10/14/12 0837  WBC 9.2 7.5 10.9*  NEUTROABS 6.1  --   --   HGB 12.1* 11.8* 10.7*  HCT 33.9* 33.5* 29.1*  MCV 96.6 97.7 94.2  PLT 267 226 220   Cardiac Enzymes: No results found for this basename: CKTOTAL, CKMB, CKMBINDEX, TROPONINI,  in the last 168 hours BNP: BNP (last 3 results) No results found for this basename: PROBNP,  in the last 8760 hours CBG: No results found for this basename: GLUCAP,  in the last 168  hours  Signed:  Furman Trentman L  [facility] 10/15/2012, 5:15 PM

## 2012-10-15 NOTE — Progress Notes (Signed)
Pain and diarrhea somewhat improved.  N/V resolved.  Will advance to low residue.  Saline lock IV, change to PO meds. Ambulate.  Home tonight or in am if stable

## 2012-10-15 NOTE — Progress Notes (Signed)
Patient discharged with paperwork and belongings, VSS, no questions. Walked to ED to meet his ride. Refused wheelchair escort.

## 2012-10-15 NOTE — Clinical Documentation Improvement (Signed)
THIS DOCUMENT IS NOT A PERMANENT PART OF THE MEDICAL RECORD  Please update your documentation with the medical record to reflect your response to this query. If you need help knowing how to do this please call [phone].  10/15/12  Dear Dr.  [name]  In an effort to better capture your patient's severity of illness, reflect appropriate length of stay and utilization of resources, a review of the patient medical record has revealed the following indicators.    Based on your clinical judgment, please clarify and document in a progress note and/or discharge summary the clinical condition associated with the following supporting information:  In responding to this query please exercise your independent judgment.  The fact that a query is asked, does not imply that any particular answer is desired or expected.  Possible Clinical conditions   Underweight w/BMI= 16.7  Other condition___________________  Cannot Clinically determine _____________  Risk Factors:H/o Chrohn's  Sign & Symptoms:  Per 10/14/12 progress notes from Registered Dietitian: underweight/crohn disease as evidenced by BMI of 16.7.   Treatment : Diet will be advanced per MD discretion.  Will provide Resource [place]ze tid.   Reviewed:  no additional documentation provided  Thank You,  Shelda Pal RN, BSN, CCM   Clinical Documentation Specialist [phone] Health Information Management [place]

## 2012-10-17 ENCOUNTER — Encounter (HOSPITAL_COMMUNITY): Payer: Self-pay | Admitting: Adult Health

## 2012-10-17 ENCOUNTER — Emergency Department (HOSPITAL_COMMUNITY)
Admission: EM | Admit: 2012-10-17 | Discharge: 2012-10-17 | Disposition: A | Discharge status: Home / Self Care | Payer: Medicare Other | Attending: Emergency Medicine | Admitting: Emergency Medicine

## 2012-10-17 DIAGNOSIS — I10 Essential (primary) hypertension: Secondary | ICD-10-CM | POA: Insufficient documentation

## 2012-10-17 DIAGNOSIS — K5090 Crohn's disease, unspecified, without complications: Secondary | ICD-10-CM | POA: Insufficient documentation

## 2012-10-17 DIAGNOSIS — F17200 Nicotine dependence, unspecified, uncomplicated: Secondary | ICD-10-CM | POA: Insufficient documentation

## 2012-10-17 DIAGNOSIS — M542 Cervicalgia: Secondary | ICD-10-CM | POA: Insufficient documentation

## 2012-10-17 DIAGNOSIS — R1084 Generalized abdominal pain: Secondary | ICD-10-CM | POA: Insufficient documentation

## 2012-10-17 DIAGNOSIS — R109 Unspecified abdominal pain: Secondary | ICD-10-CM

## 2012-10-17 DIAGNOSIS — IMO0002 Reserved for concepts with insufficient information to code with codable children: Secondary | ICD-10-CM | POA: Insufficient documentation

## 2012-10-17 DIAGNOSIS — G8929 Other chronic pain: Secondary | ICD-10-CM | POA: Insufficient documentation

## 2012-10-17 DIAGNOSIS — Z8739 Personal history of other diseases of the musculoskeletal system and connective tissue: Secondary | ICD-10-CM | POA: Insufficient documentation

## 2012-10-17 LAB — URINE MICROSCOPIC-ADD ON

## 2012-10-17 LAB — CBC WITH DIFFERENTIAL/PLATELET
Basophils Absolute: 0 10*3/uL (ref 0.0–0.1)
Basophils Relative: 0 % (ref 0–1)
Eosinophils Absolute: 0.1 10*3/uL (ref 0.0–0.7)
Eosinophils Relative: 1 % (ref 0–5)
MCH: 34.7 pg — ABNORMAL HIGH (ref 26.0–34.0)
MCHC: 36.9 g/dL — ABNORMAL HIGH (ref 30.0–36.0)
MCV: 93.9 fL (ref 78.0–100.0)
Neutrophils Relative %: 63 % (ref 43–77)
Platelets: 282 10*3/uL (ref 150–400)
RBC: 3.75 MIL/uL — ABNORMAL LOW (ref 4.22–5.81)
RDW: 15.6 % — ABNORMAL HIGH (ref 11.5–15.5)

## 2012-10-17 LAB — URINALYSIS, ROUTINE W REFLEX MICROSCOPIC
Bilirubin Urine: NEGATIVE
Nitrite: NEGATIVE
Protein, ur: 30 mg/dL — AB
Specific Gravity, Urine: 1.015 (ref 1.005–1.030)
Urobilinogen, UA: 0.2 mg/dL (ref 0.0–1.0)

## 2012-10-17 LAB — BASIC METABOLIC PANEL
Calcium: 9.3 mg/dL (ref 8.4–10.5)
GFR calc Af Amer: >90 mL/min (ref >90)
GFR calc non Af Amer: >90 mL/min (ref >90)
Glucose, Bld: 86 mg/dL (ref 70–99)
Potassium: 2.9 mEq/L — ABNORMAL LOW (ref 3.5–5.1)
Sodium: 133 mEq/L — ABNORMAL LOW (ref 135–145)

## 2012-10-17 MED ORDER — HYDROMORPHONE HCL PF 1 MG/ML IJ SOLN
1.0000 mg | Freq: Once | INTRAMUSCULAR | Status: AC
  Administered 2012-10-17: 1 mg via INTRAVENOUS
  Filled 2012-10-17: qty 1

## 2012-10-17 MED ORDER — POTASSIUM CHLORIDE CRYS ER 20 MEQ PO TBCR
40.0000 meq | EXTENDED_RELEASE_TABLET | Freq: Once | ORAL | Status: AC
  Administered 2012-10-17: 40 meq via ORAL
  Filled 2012-10-17: qty 2

## 2012-10-17 MED ORDER — SODIUM CHLORIDE 0.9 % IV BOLUS (SEPSIS)
1000.0000 mL | Freq: Once | INTRAVENOUS | Status: AC
  Administered 2012-10-17: 1000 mL via INTRAVENOUS

## 2012-10-17 MED ORDER — ONDANSETRON HCL 4 MG/2ML IJ SOLN
4.0000 mg | Freq: Once | INTRAMUSCULAR | Status: AC
  Administered 2012-10-17: 4 mg via INTRAVENOUS
  Filled 2012-10-17: qty 2

## 2012-10-17 NOTE — ED Notes (Signed)
Reports waking at 0430 vomiting with "dark red blood"  Vomited x 4 between 0430 and 1000.  Was discharged from here two days ago with Crohns exacerbation.  Took meds given without much relief.  Will call GI on Monday but cannot wait that long.

## 2012-10-17 NOTE — ED Notes (Signed)
Presents with 2-3 days of generalized abdominal pain associated with bloody loose stools, hematemesis, dizziness and diaphoresis. Recently discharged from hospital with same. Hx of Crohn's.

## 2012-10-17 NOTE — ED Provider Notes (Signed)
CSN: [id number]     Arrival date & time 10/17/12  1942 History   First MD Initiated Contact with Patient 10/17/12 1953     Chief Complaint  Patient presents with  . Crohn's Disease   (Consider location/radiation/quality/duration/timing/severity/associated sxs/prior Treatment) HPI Pt with history of chronic abdominal pain and crohn's disease was recently dismissed from Pain Clinic due to missed appointments and too many ED visits. Last 30 day Rx for Oxycodone 67m was filled 09/02/12. He was seen in the ED earlier this week for abdominal pain, had CT showing terminal ileitis which is also consistent with previous exacerbations. Admitted for treatment, restarted steroids and immunomodulating meds as well as Norco however the Norco made him sweaty so he stopped taking it. He reports continued symptoms since discharge 2 days ago, severe lower abdominal and groin pain with multiple episodes of vomiting.   Past Medical History  Diagnosis Date  . Crohn disease   . Hypertension   . Chronic neck pain   . Chronic abdominal pain   . Cervical radiculopathy   . Shortness of breath     "sometimes laying down; sometimes w/activity" (10/13/2012)   Past Surgical History  Procedure Laterality Date  . Bowel resection      x2  . Colon surgery     History reviewed. No pertinent family history. History  Substance Use Topics  . Smoking status: Current Every Day Smoker -- 0.75 packs/day for 22 years    Types: Cigarettes  . Smokeless tobacco: Not on file  . Alcohol Use: No    Review of Systems All other systems reviewed and are negative except as noted in HPI.   Allergies  Hydrocodone-acetaminophen; Tramadol; and Morphine and related  Home Medications   Current Outpatient Rx  Name  Route  Sig  Dispense  Refill  . mercaptopurine (PURINETHOL) 50 MG tablet   Oral   Take 50 mg by mouth daily. Give on an empty stomach 1 hour before or 2 hours after meals. Caution: Chemotherapy.         .  predniSONE (DELTASONE) 10 MG tablet   Oral   Take 4 tablets (40 mg total) by mouth daily.   50 tablet   0   . promethazine (PHENERGAN) 12.5 MG tablet   Oral   Take 1 tablet (12.5 mg total) by mouth every 6 (six) hours as needed for nausea.   15 tablet   0   . HYDROcodone-acetaminophen (NORCO/VICODIN) 5-325 MG per tablet   Oral   Take 1-2 tablets by mouth every 4 (four) hours as needed for pain.   20 tablet   0    BP 128/101  Pulse 94  Temp(Src) 98.8 F (37.1 C) (Oral)  Resp 18  SpO2 99% Physical Exam  Nursing note and vitals reviewed. Constitutional: He is oriented to person, place, and time. He appears well-developed and well-nourished.  HENT:  Head: Normocephalic and atraumatic.  Eyes: EOM are normal. Pupils are equal, round, and reactive to light.  Neck: Normal range of motion. Neck supple.  Cardiovascular: Normal rate, normal heart sounds and intact distal pulses.   Pulmonary/Chest: Effort normal and breath sounds normal.  Abdominal: Bowel sounds are normal. He exhibits no distension. There is tenderness (diffuse lower abdomen). There is no rebound and no guarding.  No peritoneal signs  Musculoskeletal: Normal range of motion. He exhibits no edema and no tenderness.  Neurological: He is alert and oriented to person, place, and time. He has normal strength. No cranial nerve  deficit or sensory deficit.  Skin: Skin is warm and dry. No rash noted.  Psychiatric: He has a normal mood and affect.    ED Course  Procedures (including critical care time) Labs Review Labs Reviewed  CBC WITH DIFFERENTIAL - Abnormal; Notable for the following:    WBC 10.6 (*)    RBC 3.75 (*)    HCT 35.2 (*)    MCH 34.7 (*)    MCHC 36.9 (*)    RDW 15.6 (*)    All other components within normal limits  BASIC METABOLIC PANEL - Abnormal; Notable for the following:    Sodium 133 (*)    Potassium 2.9 (*)    Chloride 93 (*)    All other components within normal limits  URINALYSIS, ROUTINE W  REFLEX MICROSCOPIC   Imaging Review No results found.  MDM  No diagnosis found.  Awaiting UA, mild hypokalemic otherwise no concerning changes in labs. Care signed out at the change of shift.     Charles B. Karle Starch, MD 10/17/12 2246

## 2012-10-17 NOTE — ED Provider Notes (Signed)
I assumed care at signout U/a is unremarkable Pt is resting comfortably, abdomen soft on my evaluation Stable for d/c home  Joya Gaskins, MD 10/17/12 2310

## 2013-01-23 ENCOUNTER — Emergency Department (HOSPITAL_COMMUNITY): Payer: Medicare Other

## 2013-01-23 ENCOUNTER — Emergency Department (HOSPITAL_COMMUNITY)
Admission: EM | Admit: 2013-01-23 | Discharge: 2013-01-23 | Disposition: A | Discharge status: Home / Self Care | Payer: Medicare Other | Attending: Emergency Medicine | Admitting: Emergency Medicine

## 2013-01-23 ENCOUNTER — Encounter (HOSPITAL_COMMUNITY): Payer: Self-pay | Admitting: Emergency Medicine

## 2013-01-23 DIAGNOSIS — M6281 Muscle weakness (generalized): Secondary | ICD-10-CM | POA: Insufficient documentation

## 2013-01-23 DIAGNOSIS — M5412 Radiculopathy, cervical region: Secondary | ICD-10-CM | POA: Insufficient documentation

## 2013-01-23 DIAGNOSIS — Z885 Allergy status to narcotic agent status: Secondary | ICD-10-CM | POA: Insufficient documentation

## 2013-01-23 DIAGNOSIS — F17200 Nicotine dependence, unspecified, uncomplicated: Secondary | ICD-10-CM | POA: Insufficient documentation

## 2013-01-23 DIAGNOSIS — Z79899 Other long term (current) drug therapy: Secondary | ICD-10-CM | POA: Insufficient documentation

## 2013-01-23 DIAGNOSIS — Z8709 Personal history of other diseases of the respiratory system: Secondary | ICD-10-CM | POA: Insufficient documentation

## 2013-01-23 DIAGNOSIS — Z888 Allergy status to other drugs, medicaments and biological substances status: Secondary | ICD-10-CM | POA: Insufficient documentation

## 2013-01-23 DIAGNOSIS — I10 Essential (primary) hypertension: Secondary | ICD-10-CM | POA: Insufficient documentation

## 2013-01-23 DIAGNOSIS — M542 Cervicalgia: Secondary | ICD-10-CM | POA: Insufficient documentation

## 2013-01-23 DIAGNOSIS — M62838 Other muscle spasm: Secondary | ICD-10-CM | POA: Insufficient documentation

## 2013-01-23 DIAGNOSIS — R109 Unspecified abdominal pain: Secondary | ICD-10-CM | POA: Insufficient documentation

## 2013-01-23 DIAGNOSIS — R209 Unspecified disturbances of skin sensation: Secondary | ICD-10-CM | POA: Insufficient documentation

## 2013-01-23 DIAGNOSIS — G8929 Other chronic pain: Secondary | ICD-10-CM | POA: Insufficient documentation

## 2013-01-23 DIAGNOSIS — K5090 Crohn's disease, unspecified, without complications: Secondary | ICD-10-CM | POA: Insufficient documentation

## 2013-01-23 MED ORDER — OXYCODONE-ACETAMINOPHEN 5-325 MG PO TABS
2.0000 | ORAL_TABLET | Freq: Once | ORAL | Status: AC
  Administered 2013-01-23: 2 via ORAL
  Filled 2013-01-23: qty 2

## 2013-01-23 MED ORDER — OXYCODONE-ACETAMINOPHEN 5-325 MG PO TABS: 1.0000 | ORAL_TABLET | ORAL | Status: DC | PRN

## 2013-01-23 NOTE — ED Notes (Signed)
Pt. Stated, i have a slipped disc, i had a catch in neck a year ago.  The pain started bad 4 days ago.

## 2013-01-23 NOTE — ED Provider Notes (Signed)
Medical screening examination/treatment/procedure(s) were performed by non-physician practitioner and as supervising physician I was immediately available for consultation/collaboration.  EKG Interpretation   None        Braileigh Landenberger K Linker, MD 01/23/13 1217 

## 2013-01-23 NOTE — ED Notes (Signed)
PT reports having an injection in neck which took care of pain but it has worn off.

## 2013-01-23 NOTE — ED Provider Notes (Signed)
CSN: [id number]     Arrival date & time 01/23/13  0827 History   First MD Initiated Contact with Patient 01/23/13 0904    This chart was scribed for Trixie Dredge PA-C, a non-physician practitioner working with Ethelda Chick, MD by Lewanda Rife, ED Scribe. This patient was seen in room TR05C/TR05C and the patient's care was started at 9:34 AM      Chief Complaint  Patient presents with  . Back Pain   (Consider location/radiation/quality/duration/timing/severity/associated sxs/prior Treatment) The history is provided by the patient and medical records. No language interpreter was used.   HPI Comments: Benjamin Murillo is a 42 y.o. male who presents to the Emergency Department with a PMHx of chronic neck pain and cervical radiculopathy (started 6 months ago) complaining of neck pain exacerbation 7 days ago. Describes pain as 10/10 in severity and pulling sensation. Denies any precipitating factors. Reports associated intermittent numbness down left posterior arm and 4th and 5th digits, gradual weakness of left arm, and muscle spasm of posterior left upper arm. Denies associated recent injury, fall, change in activity, cough, shortness of breath, and right arm pain. Reports symptoms are exacerbated when lying in supine position and sitting. Denies urinary or fecal incontinence, urinary retention, perineal/saddle paresthesias, and fever. Reports PMHx of Crohn's disease.  Past Medical History  Diagnosis Date  . Crohn disease   . Hypertension   . Chronic neck pain   . Chronic abdominal pain   . Cervical radiculopathy   . Shortness of breath     "sometimes laying down; sometimes w/activity" (10/13/2012)   Past Surgical History  Procedure Laterality Date  . Bowel resection      x2  . Colon surgery     No family history on file. History  Substance Use Topics  . Smoking status: Current Every Day Smoker -- 0.75 packs/day for 22 years    Types: Cigarettes  . Smokeless tobacco: Not on file   . Alcohol Use: No    Review of Systems  Constitutional: Negative for fever and chills.  Musculoskeletal: Positive for back pain and neck pain.  Neurological: Positive for weakness and numbness.       Allergies  Hydrocodone-acetaminophen; Tramadol; and Morphine and related  Home Medications   Current Outpatient Rx  Name  Route  Sig  Dispense  Refill  . HYDROcodone-acetaminophen (NORCO/VICODIN) 5-325 MG per tablet   Oral   Take 1-2 tablets by mouth every 4 (four) hours as needed for pain.   20 tablet   0   . mercaptopurine (PURINETHOL) 50 MG tablet   Oral   Take 50 mg by mouth daily. Give on an empty stomach 1 hour before or 2 hours after meals. Caution: Chemotherapy.         . predniSONE (DELTASONE) 10 MG tablet   Oral   Take 4 tablets (40 mg total) by mouth daily.   50 tablet   0   . promethazine (PHENERGAN) 12.5 MG tablet   Oral   Take 1 tablet (12.5 mg total) by mouth every 6 (six) hours as needed for nausea.   15 tablet   0    BP 139/88  Pulse 70  Temp(Src) 98.2 F (36.8 C) (Oral)  Resp 15  SpO2 99% Physical Exam  Nursing note and vitals reviewed. Constitutional: He appears well-developed and well-nourished. No distress.  HENT:  Head: Normocephalic and atraumatic.  Neck: Neck supple.  Cardiovascular: Intact distal pulses and normal pulses.   Pulses:  Radial pulses are 2+ on the right side, and 2+ on the left side.  Pulmonary/Chest: Effort normal.  Musculoskeletal:       Back:  Spine nontender, no crepitus, or stepoffs.   Neurological: He is alert.  Sensation intact, strength decreased on left upper extremity. 4/5 strength on LUE   Skin: He is not diaphoretic.    ED Course  Procedures (including critical care time)  COORDINATION OF CARE:  Nursing notes reviewed. Vital signs reviewed. Initial pt interview and examination performed.   Treatment plan initiated: Medications  oxyCODONE-acetaminophen (PERCOCET/ROXICET) 5-325 MG per tablet  2 tablet (not administered)    10:23 AM Persistent weakness of left arm despite pain control  Initial diagnostic testing ordered.    Labs Review Labs Reviewed - No data to display Imaging Review Mr Cervical Spine Wo Contrast  01/23/2013   CLINICAL DATA:  Neck pain.  Left arm weakness.  EXAM: MRI CERVICAL SPINE WITHOUT CONTRAST  TECHNIQUE: Multiplanar, multisequence MR imaging was performed. No intravenous contrast was administered.  COMPARISON:  Cervical MRI 05/20/2012  FINDINGS: Mild Chiari malformation. Cerebellar tonsils extend 8 mm below the foramen magnum. This is unchanged. No cervical spine syrinx is identified. Spinal cord signal is normal.  The cervical alignment is normal.  No fracture or mass lesion.  C2-3:  Negative  C3-4:  Small central disc protrusion, unchanged.  C4-5:  Negative  C5-6:  Negative  C6-7: Possible left foraminal disc protrusion. There is motion on the study degrading image quality. Question left C7 radiculopathy.  C7-T1:  Negative  IMPRESSION: Mild Chiari more formation, stable.  Question left foraminal small disc protrusion at C6-7. Question left C7 radicular pain.   Electronically Signed   By: Marlan Palau M.D.   On: 01/23/2013 11:59    EKG Interpretation   None       MDM   1. Cervical radicular pain     Pt with chronic neck pain with left upper extremity pain, weakness and numbness.  Pain treated in ED.  MRI c-spine shows small disc protrusion that is unchanged.  Pt d/c home with percocet, neurosurgery follow up.  Discussed result, findings, treatment, and follow up  with patient.  Pt given return precautions.  Pt verbalizes understanding and agrees with plan.      I personally performed the services described in this documentation, which was scribed in my presence. The recorded information has been reviewed and is accurate.    Trixie Dredge, PA-C 01/23/13 1212

## 2013-02-04 ENCOUNTER — Other Ambulatory Visit: Payer: Self-pay | Admitting: Neurosurgery

## 2013-02-10 ENCOUNTER — Encounter (HOSPITAL_COMMUNITY)
Admission: RE | Admit: 2013-02-10 | Discharge: 2013-02-10 | Disposition: A | Discharge status: Home / Self Care | Payer: Medicare Other | Source: Ambulatory Visit | Attending: Neurosurgery | Admitting: Neurosurgery

## 2013-02-10 ENCOUNTER — Encounter (HOSPITAL_COMMUNITY): Payer: Self-pay

## 2013-02-10 HISTORY — DX: Reserved for inherently not codable concepts without codable children: IMO0001

## 2013-02-10 HISTORY — DX: Gastro-esophageal reflux disease without esophagitis: K21.9

## 2013-02-10 HISTORY — DX: Headache: R51

## 2013-02-10 LAB — COMPREHENSIVE METABOLIC PANEL
ALBUMIN: 3.9 g/dL (ref 3.5–5.2)
ALT: 10 U/L (ref 0–53)
AST: 21 U/L (ref 0–37)
Alkaline Phosphatase: 82 U/L (ref 39–117)
BUN: 10 mg/dL (ref 6–23)
CALCIUM: 9.3 mg/dL (ref 8.4–10.5)
CO2: 24 mEq/L (ref 19–32)
Chloride: 99 mEq/L (ref 96–112)
Creatinine, Ser: 0.95 mg/dL (ref 0.50–1.35)
GFR calc non Af Amer: >90 mL/min (ref >90)
GLUCOSE: 78 mg/dL (ref 70–99)
Potassium: 4.4 mEq/L (ref 3.7–5.3)
SODIUM: 137 meq/L (ref 137–147)
Total Bilirubin: 0.6 mg/dL (ref 0.3–1.2)
Total Protein: 7.6 g/dL (ref 6.0–8.3)

## 2013-02-10 LAB — CBC
HCT: 39.1 % (ref 39.0–52.0)
Hemoglobin: 14 g/dL (ref 13.0–17.0)
MCH: 34.4 pg — ABNORMAL HIGH (ref 26.0–34.0)
MCHC: 35.8 g/dL (ref 30.0–36.0)
MCV: 96.1 fL (ref 78.0–100.0)
PLATELETS: 244 10*3/uL (ref 150–400)
RBC: 4.07 MIL/uL — AB (ref 4.22–5.81)
RDW: 13.1 % (ref 11.5–15.5)
WBC: 8.6 10*3/uL (ref 4.0–10.5)

## 2013-02-10 LAB — SURGICAL PCR SCREEN
MRSA, PCR: NEGATIVE
STAPHYLOCOCCUS AUREUS: NEGATIVE

## 2013-02-10 NOTE — Pre-Procedure Instructions (Signed)
ZAVEON GILLEN  02/10/2013   Your procedure is scheduled on:  Friday  02/12/13   Report to [facility]  2 * 3 at 215 PM.  Call this number if you have problems the morning of surgery: [phone]   Remember:   Do not eat food or drink liquids after midnight.   Take these medicines the morning of surgery with A SIP OF WATER:  OXYCODONE IF NEEDED, PREDNISONE  (STOP ASPIRIN, COUMADIN, PLAVIX, EFFIENT, HERBAL MEDICINES)   Do not wear jewelry, make-up or nail polish.  Do not wear lotions, powders, or perfumes. You may wear deodorant.  Do not shave 48 hours prior to surgery. Men may shave face and neck.  Do not bring valuables to the hospital.  [facility] is not responsible                  for any belongings or valuables.               Contacts, dentures or bridgework may not be worn into surgery.  Leave suitcase in the car. After surgery it may be brought to your room.  For patients admitted to the hospital, discharge time is determined by your                treatment team.               Patients discharged the day of surgery will not be allowed to drive  home.  Name and phone number of your driver:   Special Instructions: Shower using CHG 2 nights before surgery and the night before surgery.  If you shower the day of surgery use CHG.  Use special wash - you have one bottle of CHG for all showers.  You should use approximately 1/3 of the bottle for each shower.   Please read over the following fact sheets that you were given: Pain Booklet, Coughing and Deep Breathing, MRSA Information and Surgical Site Infection Prevention

## 2013-02-11 MED ORDER — CEFAZOLIN SODIUM-DEXTROSE 2-3 GM-% IV SOLR
2.0000 g | INTRAVENOUS | Status: AC
  Administered 2013-02-12: 2 g via INTRAVENOUS
  Filled 2013-02-11: qty 50

## 2013-02-12 ENCOUNTER — Encounter (HOSPITAL_COMMUNITY)
Admission: RE | Disposition: A | Discharge status: Home / Self Care | Payer: Self-pay | Source: Ambulatory Visit | Attending: Neurosurgery

## 2013-02-12 ENCOUNTER — Inpatient Hospital Stay (HOSPITAL_COMMUNITY): Payer: Medicare Other

## 2013-02-12 ENCOUNTER — Encounter (HOSPITAL_COMMUNITY): Payer: Medicare Other | Admitting: Anesthesiology

## 2013-02-12 ENCOUNTER — Inpatient Hospital Stay (HOSPITAL_COMMUNITY): Payer: Medicare Other | Admitting: Anesthesiology

## 2013-02-12 ENCOUNTER — Ambulatory Visit (HOSPITAL_COMMUNITY)
Admission: RE | Admit: 2013-02-12 | Discharge: 2013-02-13 | Disposition: A | Discharge status: Home / Self Care | Payer: Medicare Other | Source: Ambulatory Visit | Attending: Neurosurgery | Admitting: Neurosurgery

## 2013-02-12 ENCOUNTER — Encounter (HOSPITAL_COMMUNITY): Payer: Self-pay | Admitting: *Deleted

## 2013-02-12 DIAGNOSIS — K219 Gastro-esophageal reflux disease without esophagitis: Secondary | ICD-10-CM | POA: Insufficient documentation

## 2013-02-12 DIAGNOSIS — Z01818 Encounter for other preprocedural examination: Secondary | ICD-10-CM | POA: Insufficient documentation

## 2013-02-12 DIAGNOSIS — Z01812 Encounter for preprocedural laboratory examination: Secondary | ICD-10-CM | POA: Insufficient documentation

## 2013-02-12 DIAGNOSIS — I10 Essential (primary) hypertension: Secondary | ICD-10-CM | POA: Insufficient documentation

## 2013-02-12 DIAGNOSIS — Z885 Allergy status to narcotic agent status: Secondary | ICD-10-CM | POA: Insufficient documentation

## 2013-02-12 DIAGNOSIS — M5020 Other cervical disc displacement, unspecified cervical region: Principal | ICD-10-CM | POA: Insufficient documentation

## 2013-02-12 DIAGNOSIS — F17200 Nicotine dependence, unspecified, uncomplicated: Secondary | ICD-10-CM | POA: Insufficient documentation

## 2013-02-12 HISTORY — PX: ANTERIOR CERVICAL DECOMP/DISCECTOMY FUSION: SHX1161

## 2013-02-12 SURGERY — ANTERIOR CERVICAL DECOMPRESSION/DISCECTOMY FUSION 1 LEVEL
Anesthesia: General | Site: Neck

## 2013-02-12 MED ORDER — 0.9 % SODIUM CHLORIDE (POUR BTL) OPTIME
TOPICAL | Status: DC | PRN
  Administered 2013-02-12: 1000 mL

## 2013-02-12 MED ORDER — FENTANYL CITRATE 0.05 MG/ML IJ SOLN
INTRAMUSCULAR | Status: DC | PRN
  Administered 2013-02-12 (×2): 50 ug via INTRAVENOUS
  Administered 2013-02-12: 25 ug via INTRAVENOUS
  Administered 2013-02-12: 125 ug via INTRAVENOUS

## 2013-02-12 MED ORDER — ACETAMINOPHEN 650 MG RE SUPP
650.0000 mg | RECTAL | Status: DC | PRN
Start: 2013-02-12 — End: 2013-02-13

## 2013-02-12 MED ORDER — HYDROMORPHONE HCL PF 1 MG/ML IJ SOLN
0.5000 mg | INTRAMUSCULAR | Status: DC | PRN
  Administered 2013-02-13: 1 mg via INTRAVENOUS
  Filled 2013-02-12: qty 1

## 2013-02-12 MED ORDER — OXYCODONE-ACETAMINOPHEN 5-325 MG PO TABS
1.0000 | ORAL_TABLET | ORAL | Status: DC | PRN
  Administered 2013-02-12 – 2013-02-13 (×3): 2 via ORAL
  Filled 2013-02-12 (×3): qty 2

## 2013-02-12 MED ORDER — MERCAPTOPURINE 50 MG PO TABS
50.0000 mg | ORAL_TABLET | Freq: Every day | ORAL | Status: DC
Start: 2013-02-13 — End: 2013-02-13
  Administered 2013-02-13: 50 mg via ORAL
  Filled 2013-02-12: qty 1

## 2013-02-12 MED ORDER — POTASSIUM CHLORIDE IN NACL 20-0.9 MEQ/L-% IV SOLN
INTRAVENOUS | Status: DC
  Filled 2013-02-12 (×2): qty 1000

## 2013-02-12 MED ORDER — LACTATED RINGERS IV SOLN
INTRAVENOUS | Status: DC
  Administered 2013-02-12: 15:00:00 via INTRAVENOUS

## 2013-02-12 MED ORDER — OXYCODONE-ACETAMINOPHEN 5-325 MG PO TABS
ORAL_TABLET | ORAL | Status: AC
  Administered 2013-02-12: 2 via ORAL
  Filled 2013-02-12: qty 2

## 2013-02-12 MED ORDER — HYDROMORPHONE HCL PF 1 MG/ML IJ SOLN
INTRAMUSCULAR | Status: AC
  Filled 2013-02-12: qty 1

## 2013-02-12 MED ORDER — HYDROMORPHONE HCL PF 1 MG/ML IJ SOLN
INTRAMUSCULAR | Status: AC
  Administered 2013-02-12: 0.5 mg via INTRAVENOUS
  Filled 2013-02-12: qty 1

## 2013-02-12 MED ORDER — HYDROMORPHONE HCL PF 1 MG/ML IJ SOLN
0.2500 mg | INTRAMUSCULAR | Status: DC | PRN
Start: 2013-02-12 — End: 2013-02-12
  Administered 2013-02-12 (×4): 0.5 mg via INTRAVENOUS

## 2013-02-12 MED ORDER — TIZANIDINE HCL 4 MG PO TABS: 4.0000 mg | ORAL_TABLET | Freq: Four times a day (QID) | ORAL | Status: DC | PRN

## 2013-02-12 MED ORDER — SODIUM CHLORIDE 0.9 % IJ SOLN: 3.0000 mL | Freq: Two times a day (BID) | INTRAMUSCULAR | Status: DC

## 2013-02-12 MED ORDER — ONDANSETRON HCL 4 MG/2ML IJ SOLN: 4.0000 mg | INTRAMUSCULAR | Status: DC | PRN

## 2013-02-12 MED ORDER — LIDOCAINE HCL (CARDIAC) 20 MG/ML IV SOLN
INTRAVENOUS | Status: DC | PRN
  Administered 2013-02-12: 80 mg via INTRAVENOUS

## 2013-02-12 MED ORDER — SODIUM CHLORIDE 0.9 % IV SOLN: 250.0000 mL | INTRAVENOUS | Status: DC

## 2013-02-12 MED ORDER — ACETAMINOPHEN 325 MG PO TABS: 650.0000 mg | ORAL_TABLET | ORAL | Status: DC | PRN

## 2013-02-12 MED ORDER — ONDANSETRON HCL 4 MG/2ML IJ SOLN: 4.0000 mg | Freq: Once | INTRAMUSCULAR | Status: DC | PRN

## 2013-02-12 MED ORDER — PHENYLEPHRINE HCL 10 MG/ML IJ SOLN
10.0000 mg | INTRAVENOUS | Status: DC | PRN
  Administered 2013-02-12: 25 ug/min via INTRAVENOUS

## 2013-02-12 MED ORDER — OXYCODONE HCL 5 MG/5ML PO SOLN: 5.0000 mg | Freq: Once | ORAL | Status: DC | PRN

## 2013-02-12 MED ORDER — SODIUM CHLORIDE 0.9 % IJ SOLN: 3.0000 mL | INTRAMUSCULAR | Status: DC | PRN

## 2013-02-12 MED ORDER — HEMOSTATIC AGENTS (NO CHARGE) OPTIME
TOPICAL | Status: DC | PRN
  Administered 2013-02-12: 1 via TOPICAL

## 2013-02-12 MED ORDER — MIDAZOLAM HCL 5 MG/5ML IJ SOLN: INTRAMUSCULAR | Status: DC | PRN

## 2013-02-12 MED ORDER — LACTATED RINGERS IV SOLN
INTRAVENOUS | Status: DC | PRN
  Administered 2013-02-12: 18:00:00 via INTRAVENOUS

## 2013-02-12 MED ORDER — PROPOFOL 10 MG/ML IV BOLUS
INTRAVENOUS | Status: DC | PRN
  Administered 2013-02-12: 200 mg via INTRAVENOUS

## 2013-02-12 MED ORDER — DIAZEPAM 5 MG PO TABS
5.0000 mg | ORAL_TABLET | Freq: Four times a day (QID) | ORAL | Status: DC | PRN
  Administered 2013-02-12 – 2013-02-13 (×2): 5 mg via ORAL
  Filled 2013-02-12 (×2): qty 1

## 2013-02-12 MED ORDER — LIDOCAINE-EPINEPHRINE 0.5 %-1:200000 IJ SOLN
INTRAMUSCULAR | Status: DC | PRN
  Administered 2013-02-12: 2 mL

## 2013-02-12 MED ORDER — OXYCODONE HCL 5 MG PO TABS: 5.0000 mg | ORAL_TABLET | Freq: Once | ORAL | Status: DC | PRN

## 2013-02-12 MED ORDER — PHENOL 1.4 % MT LIQD: 1.0000 | OROMUCOSAL | Status: DC | PRN

## 2013-02-12 MED ORDER — ROCURONIUM BROMIDE 100 MG/10ML IV SOLN
INTRAVENOUS | Status: DC | PRN
  Administered 2013-02-12: 50 mg via INTRAVENOUS

## 2013-02-12 MED ORDER — THROMBIN 5000 UNITS EX SOLR
CUTANEOUS | Status: DC | PRN
  Administered 2013-02-12 (×2): 5000 [IU] via TOPICAL

## 2013-02-12 MED ORDER — OXYCODONE-ACETAMINOPHEN 5-325 MG PO TABS
2.0000 | ORAL_TABLET | Freq: Once | ORAL | Status: AC
  Administered 2013-02-12: 2 via ORAL

## 2013-02-12 MED ORDER — POLYETHYLENE GLYCOL 3350 17 G PO PACK
17.0000 g | PACK | Freq: Every day | ORAL | Status: DC | PRN
  Filled 2013-02-12: qty 1

## 2013-02-12 MED ORDER — OXYCODONE-ACETAMINOPHEN 5-325 MG PO TABS: 1.0000 | ORAL_TABLET | Freq: Four times a day (QID) | ORAL | Status: DC | PRN

## 2013-02-12 MED ORDER — MENTHOL 3 MG MT LOZG
1.0000 | LOZENGE | OROMUCOSAL | Status: DC | PRN
  Administered 2013-02-12: 3 mg via ORAL
  Filled 2013-02-12: qty 9

## 2013-02-12 MED ORDER — SENNA 8.6 MG PO TABS
1.0000 | ORAL_TABLET | Freq: Two times a day (BID) | ORAL | Status: DC
  Administered 2013-02-13: 8.6 mg via ORAL
  Filled 2013-02-12 (×2): qty 1

## 2013-02-12 SURGICAL SUPPLY — 71 items
BANDAGE GAUZE ELAST BULKY 4 IN (GAUZE/BANDAGES/DRESSINGS) IMPLANT
BIT DRILL NEURO 2X3.1 SFT TUCH (MISCELLANEOUS) ×1 IMPLANT
BLADE SURG ROTATE 9660 (MISCELLANEOUS) IMPLANT
BUR DRUM 4.0 (BURR) ×2 IMPLANT
CANISTER SUCT 3000ML (MISCELLANEOUS) ×2 IMPLANT
CONT SPEC 4OZ CLIKSEAL STRL BL (MISCELLANEOUS) ×2 IMPLANT
DECANTER SPIKE VIAL GLASS SM (MISCELLANEOUS) IMPLANT
DERMABOND ADVANCED (GAUZE/BANDAGES/DRESSINGS) ×1
DERMABOND ADVANCED .7 DNX12 (GAUZE/BANDAGES/DRESSINGS) ×1 IMPLANT
DRAPE LAPAROTOMY 100X72 PEDS (DRAPES) ×2 IMPLANT
DRAPE MICROSCOPE LEICA (MISCELLANEOUS) ×2 IMPLANT
DRAPE POUCH INSTRU U-SHP 10X18 (DRAPES) ×2 IMPLANT
DRAPE PROXIMA HALF (DRAPES) IMPLANT
DRILL BIT HELIX (BIT) ×2 IMPLANT
DRILL NEURO 2X3.1 SOFT TOUCH (MISCELLANEOUS) ×2
DURAPREP 6ML APPLICATOR 50/CS (WOUND CARE) ×2 IMPLANT
ELECT COATED BLADE 2.86 ST (ELECTRODE) ×2 IMPLANT
ELECT REM PT RETURN 9FT ADLT (ELECTROSURGICAL) ×2
ELECTRODE REM PT RTRN 9FT ADLT (ELECTROSURGICAL) ×1 IMPLANT
GAUZE SPONGE 4X4 16PLY XRAY LF (GAUZE/BANDAGES/DRESSINGS) IMPLANT
GLOVE BIO SURGEON STRL SZ 6.5 (GLOVE) ×6 IMPLANT
GLOVE BIO SURGEON STRL SZ7 (GLOVE) IMPLANT
GLOVE BIO SURGEON STRL SZ7.5 (GLOVE) IMPLANT
GLOVE BIO SURGEON STRL SZ8 (GLOVE) IMPLANT
GLOVE BIO SURGEON STRL SZ8.5 (GLOVE) IMPLANT
GLOVE BIOGEL M 8.0 STRL (GLOVE) ×2 IMPLANT
GLOVE BIOGEL PI IND STRL 6.5 (GLOVE) ×2 IMPLANT
GLOVE BIOGEL PI INDICATOR 6.5 (GLOVE) ×2
GLOVE ECLIPSE 6.5 STRL STRAW (GLOVE) ×2 IMPLANT
GLOVE ECLIPSE 7.0 STRL STRAW (GLOVE) IMPLANT
GLOVE ECLIPSE 7.5 STRL STRAW (GLOVE) IMPLANT
GLOVE ECLIPSE 8.0 STRL XLNG CF (GLOVE) IMPLANT
GLOVE ECLIPSE 8.5 STRL (GLOVE) IMPLANT
GLOVE EXAM NITRILE LRG STRL (GLOVE) IMPLANT
GLOVE EXAM NITRILE MD LF STRL (GLOVE) ×2 IMPLANT
GLOVE EXAM NITRILE XL STR (GLOVE) IMPLANT
GLOVE EXAM NITRILE XS STR PU (GLOVE) IMPLANT
GLOVE INDICATOR 6.5 STRL GRN (GLOVE) IMPLANT
GLOVE INDICATOR 7.0 STRL GRN (GLOVE) IMPLANT
GLOVE INDICATOR 7.5 STRL GRN (GLOVE) IMPLANT
GLOVE INDICATOR 8.0 STRL GRN (GLOVE) IMPLANT
GLOVE INDICATOR 8.5 STRL (GLOVE) IMPLANT
GLOVE OPTIFIT SS 8.0 STRL (GLOVE) IMPLANT
GLOVE SURG SS PI 6.5 STRL IVOR (GLOVE) IMPLANT
GOWN BRE IMP SLV AUR LG STRL (GOWN DISPOSABLE) IMPLANT
GOWN BRE IMP SLV AUR XL STRL (GOWN DISPOSABLE) IMPLANT
GOWN STRL REIN 2XL LVL4 (GOWN DISPOSABLE) IMPLANT
GOWN STRL REUS W/ TWL LRG LVL3 (GOWN DISPOSABLE) ×4 IMPLANT
GOWN STRL REUS W/TWL LRG LVL3 (GOWN DISPOSABLE) ×4
KIT BASIN OR (CUSTOM PROCEDURE TRAY) ×2 IMPLANT
KIT ROOM TURNOVER OR (KITS) ×2 IMPLANT
NEEDLE HYPO 25X1 1.5 SAFETY (NEEDLE) ×2 IMPLANT
NEEDLE SPNL 22GX3.5 QUINCKE BK (NEEDLE) ×2 IMPLANT
NS IRRIG 1000ML POUR BTL (IV SOLUTION) ×2 IMPLANT
PACK LAMINECTOMY NEURO (CUSTOM PROCEDURE TRAY) ×2 IMPLANT
PAD ARMBOARD 7.5X6 YLW CONV (MISCELLANEOUS) ×4 IMPLANT
PIN DISTRACTION 14MM (PIN) ×4 IMPLANT
PLATE HELIX R 22MM (Plate) ×2 IMPLANT
RUBBERBAND STERILE (MISCELLANEOUS) ×4 IMPLANT
SCREW 4.0X13 (Screw) ×4 IMPLANT
SCREW 4.0X13MM (Screw) ×4 IMPLANT
SPACER PARALLEL 6MM CC ACF (Bone Implant) ×2 IMPLANT
SPONGE INTESTINAL PEANUT (DISPOSABLE) ×2 IMPLANT
SPONGE SURGIFOAM ABS GEL SZ50 (HEMOSTASIS) ×2 IMPLANT
SUT VIC AB 0 CT1 27 (SUTURE) ×1
SUT VIC AB 0 CT1 27XBRD ANTBC (SUTURE) ×1 IMPLANT
SUT VIC AB 3-0 SH 8-18 (SUTURE) ×2 IMPLANT
SYR 20ML ECCENTRIC (SYRINGE) ×2 IMPLANT
TOWEL OR 17X24 6PK STRL BLUE (TOWEL DISPOSABLE) ×2 IMPLANT
TOWEL OR 17X26 10 PK STRL BLUE (TOWEL DISPOSABLE) ×2 IMPLANT
WATER STERILE IRR 1000ML POUR (IV SOLUTION) ×2 IMPLANT

## 2013-02-12 NOTE — Op Note (Signed)
02/12/2013  8:34 PM  PATIENT:  Benjamin Murillo  42 y.o. 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 He was positioned supine with his head in slight extension on a horseshoe headrest. The neck was prepped and draped in a sterile manner. I infiltrated 2 cc's 1/2%lidocaine/1:200,000 strength epinephrine into the planned incision starting from the midline to the medial border of the left sternocleidomastoid muscle. I opened the incision with a 10 blade and dissected sharply through soft tissue to the platysma. I dissected in the plane superior to the platysma both rostrally and caudally. I then opened the platysma in a horizontal fashion with Metzenbaum scissors, and dissected in the inferior plane rostrally and caudally. With both blunt and sharp technique I created an avascular corridor to the cervical spine. I placed a spinal needle(s) in the disc space at C6/7 . I then reflected the longus colli from C6 to C7 and placed self retaining retractors. I opened the disc space(s) at C6/7 with a 15 blade. I removed disc with curettes, Kerrison punches, and the drill. Using the drill I removed osteophytes and prepared for the decompression.  I decompressed the spinal canal and the C7 root(s) with the drill,  Kerrison punches, and the curettes. I used the microscope to aid in microdissection. I removed the posterior longitudinal ligament to fully expose and decompress the thecal sac. I exposed the roots laterally taking down the C6/7 uncovertebral joints. With the decompression complete we moved on to the arthrodesis. We used the drill to level the surfaces of C6 and C7. I removed soft tissue to prepare the disc space and the bony surfaces. I measured the space and placed a 613mstructural allograft into the disc space.  We then placed the anterior instrumentation. I placed 2 screws in each vertebral body through the plate. I locked the screws into place. Intraoperative xray showed the graft, plate, and screws to be in good position. I irrigated the wound, achieved hemostasis, and closed the wound in layers. I approximated the platysma, and the subcuticular plane with vicryl sutures. I used Dermabond for a sterile dressing.   PLAN OF CARE: Admit for overnight observation  PATIENT DISPOSITION:  PACU - hemodynamically stable.   Delay start of Pharmacological VTE agent (>24hrs) due to surgical blood loss or risk of bleeding:  yes

## 2013-02-12 NOTE — Anesthesia Preprocedure Evaluation (Addendum)
Anesthesia Evaluation  Patient identified by MRN, date of birth, ID band Patient awake    Reviewed: Allergy & Precautions, H&P , NPO status , Patient's Chart, lab work & pertinent test results  Airway Mallampati: I TM Distance: >3 FB Neck ROM: Full    Dental  (+) Teeth Intact and Dental Advisory Given   Pulmonary Current Smoker,  breath sounds clear to auscultation        Cardiovascular hypertension, Pt. on medications Rhythm:Regular Rate:Normal     Neuro/Psych    GI/Hepatic GERD-  Medicated and Controlled,  Endo/Other    Renal/GU      Musculoskeletal   Abdominal   Peds  Hematology   Anesthesia Other Findings   Reproductive/Obstetrics                          Anesthesia Physical Anesthesia Plan  ASA: II  Anesthesia Plan: General   Post-op Pain Management:    Induction: Intravenous  Airway Management Planned: Oral ETT  Additional Equipment:   Intra-op Plan:   Post-operative Plan: Extubation in OR  Informed Consent: I have reviewed the patients History and Physical, chart, labs and discussed the procedure including the risks, benefits and alternatives for the proposed anesthesia with the patient or authorized representative who has indicated his/her understanding and acceptance.   Dental advisory given  Plan Discussed with: CRNA, Anesthesiologist and Surgeon  Anesthesia Plan Comments:         Anesthesia Quick Evaluation

## 2013-02-12 NOTE — Transfer of Care (Signed)
Immediate Anesthesia Transfer of Care Note  Patient: Benjamin Murillo  Procedure(s) Performed: Procedure(s) with comments: ANTERIOR CERVICAL DECOMPRESSION/DISCECTOMY FUSION 1 LEVEL (N/A) - C67 anterior cervical decompression with fusion plating and bonegraft  Patient Location: PACU  Anesthesia Type:General  Level of Consciousness: sedated  Airway & Oxygen Therapy: Patient Spontanous Breathing and Patient connected to nasal cannula oxygen  Post-op Assessment: Report given to PACU RN, Post -op Vital signs reviewed and stable and Patient moving all extremities  Post vital signs: Reviewed and stable  Complications: No apparent anesthesia complications

## 2013-02-12 NOTE — Preoperative (Signed)
Beta Blockers   Reason not to administer Beta Blockers:Not Applicable 

## 2013-02-12 NOTE — Anesthesia Procedure Notes (Signed)
Procedure Name: Intubation Date/Time: 02/12/2013 6:10 PM Performed by: Trixie Deis A Pre-anesthesia Checklist: Patient identified, Timeout performed, Emergency Drugs available, Suction available and Patient being monitored Patient Re-evaluated:Patient Re-evaluated prior to inductionOxygen Delivery Method: Circle system utilized Preoxygenation: Pre-oxygenation with 100% oxygen Intubation Type: IV induction Ventilation: Mask ventilation without difficulty Laryngoscope Size: Miller and 3 Grade View: Grade I Tube type: Oral Tube size: 7.5 mm Number of attempts: 1 Airway Equipment and Method: Stylet and LTA kit utilized Placement Confirmation: ETT inserted through vocal cords under direct vision,  breath sounds checked- equal and bilateral and positive ETCO2 Secured at: 23 cm Tube secured with: Tape Dental Injury: Teeth and Oropharynx as per pre-operative assessment

## 2013-02-12 NOTE — Discharge Instructions (Addendum)
Anterior Cervical Fusion °Care After °Pinching of the nerves is a common cause of long-term pain. When this happens, a procedure called an anterior cervical fusion is sometimes performed. It relieves the pressure on the pinched nerve roots or spinal cord in the neck. °An anterior cervical fusion means that the operation is done through the front (anterior) of your neck to fuse bones in your neck together. This procedure is done to relieve the pressure on pinched nerve roots or spinal cord. This operation is done to control the movement of your spine, which may be pressing on the nerves. This may relieve the pain. The procedure that stops the movement of the spine is called a fusion. The cut by the surgeon (incision) is usually within a skin fold line under your chin. After moving the neck muscles gently apart, the neurosurgeon uses an operating microscope and removes the injured intervertebral disk (the cushion or pad of tissue between the bones of the spine). This takes the pressure off the nerves or spinal cord. This is called decompression. The area where the disc was removed is then filled with a bone graft. The graft will fuse the vertebrae together over time. This means it causes the vertebral bodies to grow together. The bone graft may be obtained from your own bone (your hip for example), or may be obtained from a bone bank. Receiving bone from a bone bank is similar to a blood bank, only the bone comes from human donors who have recently died. This type of graft is referred to as allograft bone. The preformed bone plug is safe and will not be rejected by your body. It does not contain blood cells. °In some cases, the surgeon may use hardware in your neck to help stabilize it. This means that metal plates or pins or screws may be used to: °· Provide extra support to the neck.  °· Help the bones to grow together more easily.  °A cervical fusion procedure takes a couple hours to several hours, depending on  what needs to be done. Your caregiver will be able to answer your questions for you. °HOME CARE INSTRUCTIONS  °· It will be normal to have a sore throat and have difficulty swallowing foods for a couple weeks following surgery. See your caregiver if this seems to be getting worse rather than better.  °· You may resume normal diet and activities as directed or allowed. Generally, walking and stair climbing are fine. Avoid lifting more than ten pounds and do no lifting above your head.  °· If given a cervical collar, remove only for bathing and eating, or as directed.  °· Use only showers for cleaning up, with no bathing, until seen.  °· You may apply ice to the surgical or bone donor site for 15 to 20 minutes each hour while awake for the first couple days following surgery. Put the ice in a plastic bag and place a towel between the bag of ice and your skin.  °· Change dressings if necessary or as directed.  °· You may drive in 10 days  °· Take prescribed medication as directed. Only take over-the-counter or prescription medicines for pain, discomfort, or fever as directed by your caregiver.  °· Make an appointment to see your caregiver for suture or staple removal when instructed.  °· If physical therapy was prescribed, follow your caregiver's directions.  °SEEK IMMEDIATE MEDICAL CARE IF: °· There is redness, swelling, or increasing pain in the wound.  °· There is   pus coming from the wound.  °· An unexplained oral temperature over 102° F (38.9° C) develops.  °· There is a bad smell coming from the wound or dressing.  °· You have swelling in your calf or leg.  °· You develop shortness of breath or chest pain.  °· The wound edges break open after sutures or staples have been removed.  °· Your pain is not controlled with medicine.  °· You seem to be getting worse rather than better.  °Document Released: 08/29/2003 Document Revised: 09/26/2010 Document Reviewed: 11/04/2007 °ExitCare® Patient Information ©2012 [employer]. ° ° ° °Wound Care °Leave incision open to air. °You may shower. °Do not scrub directly on incision.  °Do not put any creams, lotions, or ointments on incision. °Activity °Walk each and every day, increasing distance each day. °No lifting greater than 5 lbs.  Avoid excessive neck motion. °No driving for 2 weeks; may ride as a passenger locally. °Wear neck brace at all times except when showering.  If provided soft collar, may wear for comfort unless otherwise instructed. °Diet °Resume your normal diet.  °Return to Work °Will be discussed at you follow up appointment. °Call Your Doctor If Any of These Occur °Redness, drainage, or swelling at the wound.  °Temperature greater than 101 degrees. °Severe pain not relieved by pain medication. °Increased difficulty swallowing. °Incision starts to come apart. °Follow Up Appt °Call today for appointment in 4 weeks ([phone]) or for problems.  If you have any hardware placed in your spine, you will need an x-ray before your appointment. °

## 2013-02-12 NOTE — Progress Notes (Signed)
Patient had complained earlier that he was in pain.  Spoke with Dr. Maretta Bees given for oxycodone PO.   At this moment....1553 patient states he feels better.  Pain was 8--now @ a 4-5.  DA

## 2013-02-12 NOTE — H&P (Signed)
HISTORY OF PRESENT ILLNESS:                     Benjamin Murillo is a gentleman who has been having significant pain in his neck and left upper extremity along with weakness in the triceps, grip, and wrist since at the very least October 1st. He has gotten much worse over the last few weeks. He was seen in the emergency room and says the Percocet he received there was the first time he has had any good sleep during this time. He also underwent an MRI and that revealed a disc herniation on the left side of C6-7. 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 He has no syrinx. He has no symptoms referable to that. I think as of right now there is nothing that needs to be done whatsoever about it. That is an 8 mm dip below the foramen magnum.   He has had no bowel or bladder dysfunction. He does have a history of Crohn's disease. It has been the weakness that has been the most profound finding that he has had.   Medications and Allergies:  He takes budesonide DR & ER 3 mg capsule. HE IS ALLERGIC TO MORPHINE AND TRAMADOL. He has no problems with the Percocet.                         PHYSICAL EXAMINATION:                                He is 43 years of age. Height is 71 inches. Weight 133.6 pounds. BMI is 18.63. Blood pressure is 171/119. Pulse is 79. He is alert and oriented x4 and in obvious distress. He is holding his left arm in a dependent position. He has 4-/5 strength in his triceps,  4-/5 wrist extensors, 4-/5 grip on the left side. Intrinsics are working. Deltoid and biceps are working, but everything is limited by the pain that he has. He has normal strength in the right upper extremity and both lower extremities. Pupils equal, round, reactive to light, full extraocular movements, full visual fields, hearing intact to voice bilaterally. Uvula elevates in the midline. Shoulder shrug is normal. Tongue protrudes in the midline. He has a normal gait. He has  normal muscle tone, bulk, and coordination.   IMAGING  STUDIES:                                         MRI shows a herniated disc at C6-7 eccentric to the left side in the neural foramen. The rest of the spinal cord looks fine. He has a minor, as already mentioned, Chiari malformation, but there is not other pathology with that.   DIAGNOSES:                                                    Displaced disc at C6-7, left C7 radiculopathy.   ASSESSMENT/PLAN:  I have recommended to Mr. Buresh, secondary to his profound weakness, that he undergo operative decompression. He has agreed and will be taken to the operating room next week on Friday. I gave him a prescription of 77.5 mg Percocet tablets, one every two-six hours as needed for pain. I will see Mr. Givler next week.

## 2013-02-12 NOTE — Discharge Summary (Signed)
Physician Discharge Summary  Patient ID: Benjamin Murillo MRN: [id number] DOB/AGE: Mar 22, 1970 43 y.o.  Admit date: 02/12/2013 Discharge date: 02/12/2013  Admission Diagnoses:HNP Cervical C6/7, left. Left C7 radiculopathy  Discharge Diagnoses: HNP Cervical C6/7, left. Left C7 radiculopathy Active Problems:   HNP (herniated nucleus pulposus), cervical   Discharged Condition: good  Hospital Course: [name] was admitted and taken to the operating room where he underwent an uncomplicated ACDF using Nuvasive hardware(Helix). Post op he is voiding, ambulating and tolerating a regular diet. His wound is clean, dry, and without signs of infection. He remains weak in the left triceps at discharge but the pain is improved.   Consults: None  Significant Diagnostic Studies: none  Treatments: surgery: ACDF C6/7, 12m structural allograft. Nuvasive hardware  Discharge Exam: Blood pressure 112/69, pulse 64, temperature 98.3 F (36.8 C), temperature source Oral, resp. rate 14, SpO2 100.00%. General appearance: alert, cooperative, appears stated age and no distress Neurologic: Mental status: Alert, oriented, thought content appropriate Motor: left triceps weak, wrist extensors. Otherwise 5/5  Disposition: 01-Home or Self Care     Medication List         mercaptopurine 50 MG tablet  Commonly known as:  PURINETHOL  Take 50 mg by mouth daily. Give on an empty stomach 1 hour before or 2 hours after meals. Caution: Chemotherapy.     oxyCODONE-acetaminophen 5-325 MG per tablet  Commonly known as:  PERCOCET/ROXICET  Take 1 tablet by mouth every 4 (four) hours as needed for severe pain.     predniSONE 10 MG tablet  Commonly known as:  DELTASONE  Take 10 mg by mouth daily with breakfast.     tiZANidine 4 MG tablet  Commonly known as:  ZANAFLEX  Take 1 tablet (4 mg total) by mouth every 6 (six) hours as needed for muscle spasms.           Follow-up Information   Follow up with  Johntay Doolen L, MD In 3 weeks. (call the office to make an appointment)    Specialty:  Neurosurgery   Contact information:   [address]                         [address] [place] [phone]       Signed: Demetress Tift L 02/12/2013, 9:36 PM

## 2013-02-12 NOTE — Anesthesia Postprocedure Evaluation (Signed)
  Anesthesia Post-op Note  Patient: Benjamin Murillo  Procedure(s) Performed: Procedure(s) with comments: ANTERIOR CERVICAL DECOMPRESSION/DISCECTOMY FUSION 1 LEVEL (N/A) - C67 anterior cervical decompression with fusion plating and bonegraft  Patient Location: PACU  Anesthesia Type:General  Level of Consciousness: awake, alert , oriented and patient cooperative  Airway and Oxygen Therapy: Patient Spontanous Breathing  Post-op Pain: mild  Post-op Assessment: Post-op Vital signs reviewed, Patient's Cardiovascular Status Stable, Respiratory Function Stable, Patent Airway, No signs of Nausea or vomiting and Pain level controlled  Post-op Vital Signs: stable  Complications: No apparent anesthesia complications

## 2013-02-13 NOTE — Progress Notes (Signed)
Orthopedic Tech Progress Note Patient Details:  Benjamin Murillo 10-22-1970 [id number]  Ortho Devices Type of Ortho Device: Soft collar Ortho Device/Splint Interventions: Application   Benjamin Murillo 02/13/2013, 7:48 AM

## 2013-02-13 NOTE — Progress Notes (Signed)
Pt. Alert and oriented, follows simple instructions, denies pain. Incision area without swelling, redness or S/S of infection. Voiding adequate clear yellow urine. Moving all extremities well and vitals stable and documented. Patient discharged home with family. Anterior Cervical Fusion surgery notes instructions given to patient and family member for home safety and precautions. Pt. and family stated understanding of instructions given. Pain meds given per Pt.'s request for pain and discomfort of ride home

## 2013-02-13 NOTE — Discharge Summary (Signed)
Subjective: Patient reports doing well  Objective: Vital signs in last 24 hours: Temp:  [97.7 F (36.5 C)-98.9 F (37.2 C)] 98.9 F (37.2 C) (01/17 0815) Pulse Rate:  [55-77] 62 (01/17 0815) Resp:  [10-18] 18 (01/17 0815) BP: (109-152)/(69-88) 112/74 mmHg (01/17 0815) SpO2:  [94 %-100 %] 98 % (01/17 0815) Weight:  [58.1 kg (128 lb 1.4 oz)] 58.1 kg (128 lb 1.4 oz) (01/16 2149)  Intake/Output from previous day: 01/16 0701 - 01/17 0700 In: -  Out: 50 [Blood:50] Intake/Output this shift:    Physical Exam: Full strength.  Dressing CDI  Lab Results:  Recent Labs  02/10/13 1329  WBC 8.6  HGB 14.0  HCT 39.1  PLT 244   BMET  Recent Labs  02/10/13 1329  NA 137  K 4.4  CL 99  CO2 24  GLUCOSE 78  BUN 10  CREATININE 0.95  CALCIUM 9.3    Studies/Results: Dg Cervical Spine Complete  02/12/2013   CLINICAL DATA:  ACDF.  EXAM: CERVICAL SPINE  4+ VIEWS  COMPARISON:  MRI of the cervical spine 01/23/2013.  FINDINGS: Four intraoperative cross-table lateral views of the cervical spine are submitted for evaluation. The initial image demonstrates a surgical probe in place at the C7-T1 interspace. Patient is intubated and a nasogastric tube is also noted. Image number 2 demonstrates the probe in place at the C4-C5 interspace, while image number 3 demonstrates a probe in place at the C6-C7 interspace. The final image demonstrates soft tissue retractors in place anteriorly, with what appears to be a plate and screw fixation device at C6-C7. Below C7, at the image is underpenetrated.  IMPRESSION: Intraoperative documentation of ACDF at C6-C7, as above.   Electronically Signed   By: Vinnie Langton M.D.   On: 02/12/2013 21:08    Assessment/Plan: Doing well.  D/C home    LOS: 1 day    Peggyann Shoals, MD 02/13/2013, 8:44 AM

## 2013-02-16 ENCOUNTER — Encounter (HOSPITAL_COMMUNITY): Payer: Self-pay | Admitting: Neurosurgery

## 2013-04-14 ENCOUNTER — Emergency Department (HOSPITAL_COMMUNITY): Payer: Medicare Other

## 2013-04-14 ENCOUNTER — Encounter (HOSPITAL_COMMUNITY): Payer: Self-pay | Admitting: Emergency Medicine

## 2013-04-14 ENCOUNTER — Emergency Department (HOSPITAL_COMMUNITY)
Admission: EM | Admit: 2013-04-14 | Discharge: 2013-04-14 | Disposition: A | Discharge status: Home / Self Care | Payer: Medicare Other | Attending: Emergency Medicine | Admitting: Emergency Medicine

## 2013-04-14 DIAGNOSIS — M542 Cervicalgia: Secondary | ICD-10-CM

## 2013-04-14 DIAGNOSIS — Z8709 Personal history of other diseases of the respiratory system: Secondary | ICD-10-CM | POA: Insufficient documentation

## 2013-04-14 DIAGNOSIS — R109 Unspecified abdominal pain: Secondary | ICD-10-CM | POA: Insufficient documentation

## 2013-04-14 DIAGNOSIS — Z9889 Other specified postprocedural states: Secondary | ICD-10-CM | POA: Insufficient documentation

## 2013-04-14 DIAGNOSIS — G8929 Other chronic pain: Secondary | ICD-10-CM | POA: Insufficient documentation

## 2013-04-14 DIAGNOSIS — K5090 Crohn's disease, unspecified, without complications: Secondary | ICD-10-CM | POA: Insufficient documentation

## 2013-04-14 DIAGNOSIS — F17200 Nicotine dependence, unspecified, uncomplicated: Secondary | ICD-10-CM | POA: Insufficient documentation

## 2013-04-14 DIAGNOSIS — I10 Essential (primary) hypertension: Secondary | ICD-10-CM | POA: Insufficient documentation

## 2013-04-14 MED ORDER — OXYCODONE-ACETAMINOPHEN 5-325 MG PO TABS
2.0000 | ORAL_TABLET | Freq: Once | ORAL | Status: AC
  Administered 2013-04-14: 2 via ORAL
  Filled 2013-04-14: qty 2

## 2013-04-14 MED ORDER — OXYCODONE-ACETAMINOPHEN 5-325 MG PO TABS: 2.0000 | ORAL_TABLET | Freq: Four times a day (QID) | ORAL | Status: DC | PRN

## 2013-04-14 NOTE — Discharge Instructions (Signed)
You have neck pain, possibly from a cervical strain and/or pinched nerve.  ° °SEEK IMMEDIATE MEDICAL ATTENTION IF: °You develop difficulties swallowing or breathing.  °You have new or worse numbness, weakness, tingling, or movement problems in your arms or legs.  °You develop increasing pain which is uncontrolled with medications.  °You have change in bowel or bladder function, or other concerns. ° ° ° °

## 2013-04-14 NOTE — ED Notes (Signed)
Pt. reports chronic neck pain for seral months worse today , denies fall or injury , no fever or chills, pain worse when lying on bed.

## 2013-04-14 NOTE — ED Notes (Signed)
Patient has ride home with girlfriend

## 2013-04-14 NOTE — ED Provider Notes (Signed)
CSN: [id number]     Arrival date & time 04/14/13  0138 History   First MD Initiated Contact with Patient 04/14/13 [phone]     Chief Complaint  Patient presents with  . Neck Pain     (Consider location/radiation/quality/duration/timing/severity/associated sxs/prior Treatment) HPI 43 year old male with history of chronic neck pain chronic abdominal pain presents with an exacerbation of neck pain worse than baseline for the last couple weeks, he had cervical spine surgery in January of this year and his pain was only mild since then and had resolution of his left triceps and wrist extension weakness after surgery; there is been no fever no trauma in the last couple weeks but he is a flareup of severe neck pain positional and constant worse with palpation and position changes without radiation down his arms without weakness or numbness to his arms without change in bowel bladder function without weakness or numbness to his legs without chest pain cough shortness breath or change in his baseline chronic abdominal pain. There is no treatment prior to arrival. His pain is severe and worse with movement. He has had improvement in his pain like this in the past with Percocet. Past Medical History  Diagnosis Date  . Crohn disease   . Chronic neck pain   . Chronic abdominal pain   . Cervical radiculopathy   . Shortness of breath     "sometimes laying down; sometimes w/activity" (10/13/2012)  . Hypertension     no meds   . Cold     2 weeks ago with cough  still   . GERD (gastroesophageal reflux disease)   . WNUUVOZD(664.4)    Past Surgical History  Procedure Laterality Date  . Bowel resection      x2  . Nevus excision      left arm + hand  . Colon surgery      drains placed to drain cyst  . Anterior cervical decomp/discectomy fusion N/A 02/12/2013    Procedure: ANTERIOR CERVICAL DECOMPRESSION/DISCECTOMY FUSION 1 LEVEL;  Surgeon: Winfield Cunas, MD;  Location: [facility];  Service: Neurosurgery;   Laterality: N/A;  C67 anterior cervical decompression with fusion plating and bonegraft   No family history on file. History  Substance Use Topics  . Smoking status: Current Every Day Smoker -- 0.75 packs/day for 22 years    Types: Cigarettes  . Smokeless tobacco: Not on file  . Alcohol Use: No    Review of Systems 10 Systems reviewed and are negative for acute change except as noted in the HPI.   Allergies  Hydrocodone-acetaminophen; Tramadol; and Morphine and related  Home Medications   Current Outpatient Rx  Name  Route  Sig  Dispense  Refill  . HUMIRA PEN 40 MG/0.8ML injection   Injection   Inject 40 mg as directed every 14 (fourteen) days.          Marland Kitchen oxyCODONE-acetaminophen (PERCOCET) 5-325 MG per tablet   Oral   Take 2 tablets by mouth every 6 (six) hours as needed for severe pain.   20 tablet   0    BP 124/90  Pulse 69  Temp(Src) 98.2 F (36.8 C) (Oral)  Resp 16  Ht 5\' 11"  (1.803 m)  Wt 120 lb (54.432 kg)  BMI 16.74 kg/m2  SpO2 100% Physical Exam  Nursing note and vitals reviewed. Constitutional:  Awake, alert, nontoxic appearance.  HENT:  Head: Atraumatic.  Eyes: Right eye exhibits no discharge. Left eye exhibits no discharge.  Neck: Neck supple.  Diffuse posterior midline and paracervical neck tenderness  Cardiovascular: Normal rate and regular rhythm.   No murmur heard. Pulmonary/Chest: Effort normal and breath sounds normal. No respiratory distress. He has no wheezes. He has no rales. He exhibits no tenderness.  Abdominal: Soft. There is no tenderness. There is no rebound.  Musculoskeletal: He exhibits no tenderness.  Baseline ROM, no obvious new focal weakness. Normal light touch to both arms with 5/5 strength in both arms in the distributions of the axillary, median, radial, and ulnar nerve function with symmetric trace reflexes bilateral biceps triceps brachioradialis  Neurological: He is alert.  Mental status and motor strength appears  baseline for patient and situation.  Skin: No rash noted.  Psychiatric: He has a normal mood and affect.    ED Course  Procedures (including critical care time) Patient / Family / Caregiver informed of clinical course, understand medical decision-making process, and agree with plan. Labs Review Labs Reviewed - No data to display Imaging Review No results found.   EKG Interpretation None      MDM   Final diagnoses:  Neck pain    I doubt any other EMC precluding discharge at this time including, but not necessarily limited to the following:SBI, myelopathy.    Babette Relic, MD 04/16/13 1344

## 2013-06-21 ENCOUNTER — Emergency Department (HOSPITAL_COMMUNITY)
Admission: EM | Admit: 2013-06-21 | Discharge: 2013-06-21 | Disposition: A | Discharge status: Home / Self Care | Payer: Medicare Other | Attending: Emergency Medicine | Admitting: Emergency Medicine

## 2013-06-21 ENCOUNTER — Encounter (HOSPITAL_COMMUNITY): Payer: Self-pay | Admitting: Emergency Medicine

## 2013-06-21 DIAGNOSIS — K5090 Crohn's disease, unspecified, without complications: Secondary | ICD-10-CM

## 2013-06-21 DIAGNOSIS — F17200 Nicotine dependence, unspecified, uncomplicated: Secondary | ICD-10-CM | POA: Insufficient documentation

## 2013-06-21 DIAGNOSIS — Z79899 Other long term (current) drug therapy: Secondary | ICD-10-CM | POA: Insufficient documentation

## 2013-06-21 DIAGNOSIS — R109 Unspecified abdominal pain: Secondary | ICD-10-CM

## 2013-06-21 DIAGNOSIS — G8929 Other chronic pain: Secondary | ICD-10-CM | POA: Insufficient documentation

## 2013-06-21 DIAGNOSIS — I10 Essential (primary) hypertension: Secondary | ICD-10-CM | POA: Insufficient documentation

## 2013-06-21 LAB — COMPREHENSIVE METABOLIC PANEL
ALT: 6 U/L (ref 0–53)
AST: 17 U/L (ref 0–37)
Albumin: 4 g/dL (ref 3.5–5.2)
Alkaline Phosphatase: 91 U/L (ref 39–117)
BUN: 8 mg/dL (ref 6–23)
CALCIUM: 9.1 mg/dL (ref 8.4–10.5)
CO2: 22 mEq/L (ref 19–32)
Chloride: 99 mEq/L (ref 96–112)
Creatinine, Ser: 0.89 mg/dL (ref 0.50–1.35)
GFR calc Af Amer: >90 mL/min (ref >90)
GFR calc non Af Amer: >90 mL/min (ref >90)
Glucose, Bld: 116 mg/dL — ABNORMAL HIGH (ref 70–99)
POTASSIUM: 3.7 meq/L (ref 3.7–5.3)
Sodium: 135 mEq/L — ABNORMAL LOW (ref 137–147)
TOTAL PROTEIN: 7.4 g/dL (ref 6.0–8.3)
Total Bilirubin: 0.7 mg/dL (ref 0.3–1.2)

## 2013-06-21 LAB — CBC WITH DIFFERENTIAL/PLATELET
BASOS PCT: 0 % (ref 0–1)
Basophils Absolute: 0 10*3/uL (ref 0.0–0.1)
EOS ABS: 0.1 10*3/uL (ref 0.0–0.7)
EOS PCT: 1 % (ref 0–5)
HCT: 36.6 % — ABNORMAL LOW (ref 39.0–52.0)
Hemoglobin: 12.6 g/dL — ABNORMAL LOW (ref 13.0–17.0)
Lymphocytes Relative: 27 % (ref 12–46)
Lymphs Abs: 2.5 10*3/uL (ref 0.7–4.0)
MCH: 32.4 pg (ref 26.0–34.0)
MCHC: 34.4 g/dL (ref 30.0–36.0)
MCV: 94.1 fL (ref 78.0–100.0)
Monocytes Absolute: 0.6 10*3/uL (ref 0.1–1.0)
Monocytes Relative: 7 % (ref 3–12)
NEUTROS PCT: 65 % (ref 43–77)
Neutro Abs: 5.8 10*3/uL (ref 1.7–7.7)
PLATELETS: 240 10*3/uL (ref 150–400)
RBC: 3.89 MIL/uL — ABNORMAL LOW (ref 4.22–5.81)
RDW: 13.3 % (ref 11.5–15.5)
WBC: 9 10*3/uL (ref 4.0–10.5)

## 2013-06-21 LAB — URINALYSIS, ROUTINE W REFLEX MICROSCOPIC
Glucose, UA: NEGATIVE mg/dL
KETONES UR: NEGATIVE mg/dL
Leukocytes, UA: NEGATIVE
Nitrite: NEGATIVE
PROTEIN: NEGATIVE mg/dL
Specific Gravity, Urine: 1.03 (ref 1.005–1.030)
UROBILINOGEN UA: 1 mg/dL (ref 0.0–1.0)
pH: 5.5 (ref 5.0–8.0)

## 2013-06-21 LAB — URINE MICROSCOPIC-ADD ON

## 2013-06-21 LAB — LIPASE, BLOOD: Lipase: 24 U/L (ref 11–59)

## 2013-06-21 MED ORDER — SODIUM CHLORIDE 0.9 % IV BOLUS (SEPSIS)
1000.0000 mL | Freq: Once | INTRAVENOUS | Status: AC
  Administered 2013-06-21: 1000 mL via INTRAVENOUS

## 2013-06-21 MED ORDER — METHYLPREDNISOLONE SODIUM SUCC 125 MG IJ SOLR
125.0000 mg | Freq: Once | INTRAMUSCULAR | Status: AC
  Administered 2013-06-21: 125 mg via INTRAVENOUS
  Filled 2013-06-21: qty 2

## 2013-06-21 MED ORDER — ONDANSETRON HCL 4 MG/2ML IJ SOLN
4.0000 mg | Freq: Once | INTRAMUSCULAR | Status: AC
  Administered 2013-06-21: 4 mg via INTRAVENOUS
  Filled 2013-06-21: qty 2

## 2013-06-21 MED ORDER — MORPHINE SULFATE 4 MG/ML IJ SOLN
4.0000 mg | Freq: Once | INTRAMUSCULAR | Status: AC
  Administered 2013-06-21: 4 mg via INTRAVENOUS
  Filled 2013-06-21: qty 1

## 2013-06-21 MED ORDER — OXYCODONE-ACETAMINOPHEN 5-325 MG PO TABS: 1.0000 | ORAL_TABLET | ORAL | Status: DC | PRN

## 2013-06-21 MED ORDER — DOCUSATE SODIUM 100 MG PO CAPS: 100.0000 mg | ORAL_CAPSULE | Freq: Two times a day (BID) | ORAL | Status: DC

## 2013-06-21 NOTE — Discharge Instructions (Signed)
Abdominal Pain, Adult °Many things can cause abdominal pain. Usually, abdominal pain is not caused by a disease and will improve without treatment. It can often be observed and treated at home. Your health care provider will do a physical exam and possibly order blood tests and X-rays to help determine the seriousness of your pain. However, in many cases, more time must pass before a clear cause of the pain can be found. Before that point, your health care provider may not know if you need more testing or further treatment. °HOME CARE INSTRUCTIONS  °Monitor your abdominal pain for any changes. The following actions may help to alleviate any discomfort you are experiencing: °· Only take over-the-counter or prescription medicines as directed by your health care provider. °· Do not take laxatives unless directed to do so by your health care provider. °· Try a clear liquid diet (broth, tea, or water) as directed by your health care provider. Slowly move to a bland diet as tolerated. °SEEK MEDICAL CARE IF: °· You have unexplained abdominal pain. °· You have abdominal pain associated with nausea or diarrhea. °· You have pain when you urinate or have a bowel movement. °· You experience abdominal pain that wakes you in the night. °· You have abdominal pain that is worsened or improved by eating food. °· You have abdominal pain that is worsened with eating fatty foods. °SEEK IMMEDIATE MEDICAL CARE IF:  °· Your pain does not go away within 2 hours. °· You have a fever. °· You keep throwing up (vomiting). °· Your pain is felt only in portions of the abdomen, such as the right side or the left lower portion of the abdomen. °· You pass bloody or black tarry stools. °MAKE SURE YOU: °· Understand these instructions.   °· Will watch your condition.   °· Will get help right away if you are not doing well or get worse.   °Document Released: 10/24/2004 Document Revised: 11/04/2012 Document Reviewed: 09/23/2012 °ExitCare® Patient  Information ©2014 [employer]. ° °

## 2013-06-21 NOTE — ED Notes (Signed)
C/o mid upper abd pain x 4 days with nausea, vomiting, and diarrhea.  Pt states he believes it is a crohn's flare up.

## 2013-06-21 NOTE — ED Notes (Signed)
Pt discharged. Instructed to follow up with [facility] clinic. Has no further questions. Vital signs stable.

## 2013-06-21 NOTE — ED Provider Notes (Signed)
CSN: [id number]     Arrival date & time 06/21/13  0010 History   First MD Initiated Contact with Patient 06/21/13 0430     Chief Complaint  Patient presents with  . Abdominal Pain     (Consider location/radiation/quality/duration/timing/severity/associated sxs/prior Treatment) HPI  Patient is a 43 yo man with Crohn's disease who presents with approximately 1 week of diffuse abdominal pain. Over the past two days, he has had nausea and vomiting and has been unable to keep down po intake. He describes his pain as aching and severe, moderate in severity and without radiation.  He has had nausea and has vomited with each attempt at po intake over the past 2 days. He has not had bloody stools. No fever. He has chronic diarrhea which is unchanged.   Says his sx feels like typical Crohn's flare. He denies blood stools and tenesmus. Only medication he is taking is Humira which, he says, causes him to have diarrhea.   Past Medical History  Diagnosis Date  . Crohn disease   . Chronic neck pain   . Chronic abdominal pain   . Cervical radiculopathy   . Shortness of breath     "sometimes laying down; sometimes w/activity" (10/13/2012)  . Hypertension     no meds   . Cold     2 weeks ago with cough  still   . GERD (gastroesophageal reflux disease)   . OXBDZHGD(924.2)    Past Surgical History  Procedure Laterality Date  . Bowel resection      x2  . Nevus excision      left arm + hand  . Colon surgery      drains placed to drain cyst  . Anterior cervical decomp/discectomy fusion N/A 02/12/2013    Procedure: ANTERIOR CERVICAL DECOMPRESSION/DISCECTOMY FUSION 1 LEVEL;  Surgeon: Winfield Cunas, MD;  Location: [facility];  Service: Neurosurgery;  Laterality: N/A;  C67 anterior cervical decompression with fusion plating and bonegraft   No family history on file. History  Substance Use Topics  . Smoking status: Current Every Day Smoker -- 0.75 packs/day for 22 years    Types: Cigarettes  .  Smokeless tobacco: Not on file  . Alcohol Use: No    Review of Systems Ten point review of symptoms performed and is negative with the exception of symptoms noted above.     Allergies  Hydrocodone-acetaminophen; Tramadol; and Morphine and related  Home Medications   Prior to Admission medications   Medication Sig Start Date End Date Taking? Authorizing Provider  HUMIRA PEN 40 MG/0.8ML injection Inject 40 mg as directed every 14 (fourteen) days.  03/22/13   Historical Provider, MD  oxyCODONE-acetaminophen (PERCOCET) 5-325 MG per tablet Take 2 tablets by mouth every 6 (six) hours as needed for severe pain. 04/14/13   Babette Relic, MD   BP 148/95  Pulse 71  Temp(Src) 98.4 F (36.9 C) (Oral)  Resp 18  Ht 5' 11"  (1.803 m)  Wt 130 lb (58.968 kg)  BMI 18.14 kg/m2  SpO2 99% Physical Exam Gen: well developed and well nourished appearing Head: NCAT Eyes: PERL, EOMI Nose: no epistaixis or rhinorrhea Mouth/throat: mucosa is moist and pink Neck: supple, no stridor Lungs: CTA B, no wheezing, rhonchi or rales CV: RRR, no murmur, extremities appear well perfused.  Abd: soft, mildly tender over the lower abdomen without rebound or gaurding, nondistended Back: no ttp, no cva ttp Skin: warm and dry Ext: normal to inspection, no dependent edema Neuro: CN  ii-xii grossly intact, no focal deficits Psyche; normal affect,  calm and cooperative.   ED Course  Procedures (including critical care time) Labs Review  Results for orders placed during the hospital encounter of 06/21/13 (from the past 24 hour(s))  CBC WITH DIFFERENTIAL     Status: Abnormal   Collection Time    06/21/13 12:30 AM      Result Value Ref Range   WBC 9.0  4.0 - 10.5 K/uL   RBC 3.89 (*) 4.22 - 5.81 MIL/uL   Hemoglobin 12.6 (*) 13.0 - 17.0 g/dL   HCT 36.6 (*) 39.0 - 52.0 %   MCV 94.1  78.0 - 100.0 fL   MCH 32.4  26.0 - 34.0 pg   MCHC 34.4  30.0 - 36.0 g/dL   RDW 13.3  11.5 - 15.5 %   Platelets 240  150 - 400 K/uL    Neutrophils Relative % 65  43 - 77 %   Neutro Abs 5.8  1.7 - 7.7 K/uL   Lymphocytes Relative 27  12 - 46 %   Lymphs Abs 2.5  0.7 - 4.0 K/uL   Monocytes Relative 7  3 - 12 %   Monocytes Absolute 0.6  0.1 - 1.0 K/uL   Eosinophils Relative 1  0 - 5 %   Eosinophils Absolute 0.1  0.0 - 0.7 K/uL   Basophils Relative 0  0 - 1 %   Basophils Absolute 0.0  0.0 - 0.1 K/uL  COMPREHENSIVE METABOLIC PANEL     Status: Abnormal   Collection Time    06/21/13 12:30 AM      Result Value Ref Range   Sodium 135 (*) 137 - 147 mEq/L   Potassium 3.7  3.7 - 5.3 mEq/L   Chloride 99  96 - 112 mEq/L   CO2 22  19 - 32 mEq/L   Glucose, Bld 116 (*) 70 - 99 mg/dL   BUN 8  6 - 23 mg/dL   Creatinine, Ser 0.89  0.50 - 1.35 mg/dL   Calcium 9.1  8.4 - 10.5 mg/dL   Total Protein 7.4  6.0 - 8.3 g/dL   Albumin 4.0  3.5 - 5.2 g/dL   AST 17  0 - 37 U/L   ALT 6  0 - 53 U/L   Alkaline Phosphatase 91  39 - 117 U/L   Total Bilirubin 0.7  0.3 - 1.2 mg/dL   GFR calc non Af Amer >90  >90 mL/min   GFR calc Af Amer >90  >90 mL/min  LIPASE, BLOOD     Status: None   Collection Time    06/21/13 12:30 AM      Result Value Ref Range   Lipase 24  11 - 59 U/L  URINALYSIS, ROUTINE W REFLEX MICROSCOPIC     Status: Abnormal   Collection Time    06/21/13  4:48 AM      Result Value Ref Range   Color, Urine AMBER (*) YELLOW   APPearance CLOUDY (*) CLEAR   Specific Gravity, Urine 1.030  1.005 - 1.030   pH 5.5  5.0 - 8.0   Glucose, UA NEGATIVE  NEGATIVE mg/dL   Hgb urine dipstick TRACE (*) NEGATIVE   Bilirubin Urine SMALL (*) NEGATIVE   Ketones, ur NEGATIVE  NEGATIVE mg/dL   Protein, ur NEGATIVE  NEGATIVE mg/dL   Urobilinogen, UA 1.0  0.0 - 1.0 mg/dL   Nitrite NEGATIVE  NEGATIVE   Leukocytes, UA NEGATIVE  NEGATIVE  URINE MICROSCOPIC-ADD ON  Status: Abnormal   Collection Time    06/21/13  4:48 AM      Result Value Ref Range   Squamous Epithelial / LPF RARE  RARE   WBC, UA 3-6  <3 WBC/hpf   RBC / HPF 3-6  <3 RBC/hpf    Bacteria, UA RARE  RARE   Casts HYALINE CASTS (*) NEGATIVE   Urine-Other MUCOUS PRESENT       MDM   Patient is feeling better after tx with IVF and analgesia along with antiemetic.   0708: Patient re-examined. VS remain wnl. Only very mild lower abdominal ttp. Labs are reassuring. The patient is stable for discharge and will call his GI Clinic at [facility] at [place] to schedule a follow up appt. Will d/c with prescription analgesia. Patient counseled re: return precautions.    Elyn Peers, MD 06/21/13 [phone]

## 2013-07-05 ENCOUNTER — Emergency Department (HOSPITAL_COMMUNITY): Payer: Medicare Other

## 2013-07-05 ENCOUNTER — Encounter (HOSPITAL_COMMUNITY): Payer: Self-pay | Admitting: Emergency Medicine

## 2013-07-05 ENCOUNTER — Emergency Department (HOSPITAL_COMMUNITY)
Admission: EM | Admit: 2013-07-05 | Discharge: 2013-07-05 | Disposition: A | Discharge status: Home / Self Care | Payer: Medicare Other | Attending: Emergency Medicine | Admitting: Emergency Medicine

## 2013-07-05 DIAGNOSIS — R109 Unspecified abdominal pain: Secondary | ICD-10-CM

## 2013-07-05 DIAGNOSIS — Z79899 Other long term (current) drug therapy: Secondary | ICD-10-CM | POA: Insufficient documentation

## 2013-07-05 DIAGNOSIS — F17200 Nicotine dependence, unspecified, uncomplicated: Secondary | ICD-10-CM | POA: Insufficient documentation

## 2013-07-05 DIAGNOSIS — D649 Anemia, unspecified: Secondary | ICD-10-CM | POA: Insufficient documentation

## 2013-07-05 DIAGNOSIS — R197 Diarrhea, unspecified: Secondary | ICD-10-CM

## 2013-07-05 DIAGNOSIS — G8929 Other chronic pain: Secondary | ICD-10-CM | POA: Insufficient documentation

## 2013-07-05 DIAGNOSIS — Z8739 Personal history of other diseases of the musculoskeletal system and connective tissue: Secondary | ICD-10-CM | POA: Insufficient documentation

## 2013-07-05 DIAGNOSIS — R112 Nausea with vomiting, unspecified: Secondary | ICD-10-CM

## 2013-07-05 DIAGNOSIS — I10 Essential (primary) hypertension: Secondary | ICD-10-CM | POA: Insufficient documentation

## 2013-07-05 DIAGNOSIS — K5090 Crohn's disease, unspecified, without complications: Secondary | ICD-10-CM | POA: Insufficient documentation

## 2013-07-05 LAB — I-STAT CG4 LACTIC ACID, ED: Lactic Acid, Venous: 0.8 mmol/L (ref 0.5–2.2)

## 2013-07-05 LAB — COMPREHENSIVE METABOLIC PANEL
ALBUMIN: 3.1 g/dL — AB (ref 3.5–5.2)
ALT: 8 U/L (ref 0–53)
AST: 15 U/L (ref 0–37)
Alkaline Phosphatase: 89 U/L (ref 39–117)
BILIRUBIN TOTAL: 0.5 mg/dL (ref 0.3–1.2)
BUN: 10 mg/dL (ref 6–23)
CHLORIDE: 101 meq/L (ref 96–112)
CO2: 27 mEq/L (ref 19–32)
Calcium: 8.8 mg/dL (ref 8.4–10.5)
Creatinine, Ser: 1.1 mg/dL (ref 0.50–1.35)
GFR calc non Af Amer: 82 mL/min — ABNORMAL LOW (ref >90)
GLUCOSE: 100 mg/dL — AB (ref 70–99)
Potassium: 3.6 mEq/L — ABNORMAL LOW (ref 3.7–5.3)
SODIUM: 138 meq/L (ref 137–147)
Total Protein: 6.2 g/dL (ref 6.0–8.3)

## 2013-07-05 LAB — CBC WITH DIFFERENTIAL/PLATELET
Basophils Absolute: 0 10*3/uL (ref 0.0–0.1)
Basophils Relative: 1 % (ref 0–1)
Eosinophils Absolute: 0.1 10*3/uL (ref 0.0–0.7)
Eosinophils Relative: 2 % (ref 0–5)
HCT: 33.6 % — ABNORMAL LOW (ref 39.0–52.0)
HEMOGLOBIN: 11.3 g/dL — AB (ref 13.0–17.0)
LYMPHS PCT: 33 % (ref 12–46)
Lymphs Abs: 2.9 10*3/uL (ref 0.7–4.0)
MCH: 31.9 pg (ref 26.0–34.0)
MCHC: 33.6 g/dL (ref 30.0–36.0)
MCV: 94.9 fL (ref 78.0–100.0)
MONOS PCT: 8 % (ref 3–12)
Monocytes Absolute: 0.7 10*3/uL (ref 0.1–1.0)
NEUTROS ABS: 4.9 10*3/uL (ref 1.7–7.7)
NEUTROS PCT: 56 % (ref 43–77)
PLATELETS: 179 10*3/uL (ref 150–400)
RBC: 3.54 MIL/uL — ABNORMAL LOW (ref 4.22–5.81)
RDW: 13.4 % (ref 11.5–15.5)
WBC: 8.6 10*3/uL (ref 4.0–10.5)

## 2013-07-05 LAB — LIPASE, BLOOD: LIPASE: 29 U/L (ref 11–59)

## 2013-07-05 MED ORDER — ONDANSETRON HCL 4 MG/2ML IJ SOLN
4.0000 mg | Freq: Once | INTRAMUSCULAR | Status: AC
  Administered 2013-07-05: 4 mg via INTRAVENOUS
  Filled 2013-07-05: qty 2

## 2013-07-05 MED ORDER — OXYCODONE-ACETAMINOPHEN 5-325 MG PO TABS: 1.0000 | ORAL_TABLET | ORAL | Status: DC | PRN

## 2013-07-05 MED ORDER — HYDROMORPHONE HCL PF 1 MG/ML IJ SOLN
1.0000 mg | Freq: Once | INTRAMUSCULAR | Status: AC
  Administered 2013-07-05: 1 mg via INTRAVENOUS
  Filled 2013-07-05: qty 1

## 2013-07-05 MED ORDER — PREDNISONE 20 MG PO TABS: 60.0000 mg | ORAL_TABLET | Freq: Every day | ORAL | Status: DC

## 2013-07-05 MED ORDER — SODIUM CHLORIDE 0.9 % IV SOLN: 1000.0000 mL | Freq: Once | INTRAVENOUS | Status: DC

## 2013-07-05 MED ORDER — ONDANSETRON HCL 4 MG PO TABS: 4.0000 mg | ORAL_TABLET | Freq: Four times a day (QID) | ORAL | Status: DC | PRN

## 2013-07-05 MED ORDER — METHYLPREDNISOLONE SODIUM SUCC 125 MG IJ SOLR
125.0000 mg | Freq: Once | INTRAMUSCULAR | Status: AC
  Administered 2013-07-05: 125 mg via INTRAVENOUS
  Filled 2013-07-05: qty 2

## 2013-07-05 MED ORDER — SODIUM CHLORIDE 0.9 % IV SOLN
1000.0000 mL | INTRAVENOUS | Status: DC
Start: 2013-07-05 — End: 2013-07-05

## 2013-07-05 MED ORDER — SODIUM CHLORIDE 0.9 % IV SOLN
1000.0000 mL | Freq: Once | INTRAVENOUS | Status: AC
  Administered 2013-07-05: 1000 mL via INTRAVENOUS

## 2013-07-05 NOTE — ED Notes (Signed)
Pt is requesting more nausea and pain medication. Roxanne Mins, MD is aware.

## 2013-07-05 NOTE — Discharge Instructions (Signed)
See your doctor at [facility] as soon as possible. Return to the ED if symptoms are getting worse. Abdominal Pain, Adult Many things can cause abdominal pain. Usually, abdominal pain is not caused by a disease and will improve without treatment. It can often be observed and treated at home. Your health care provider will do a physical exam and possibly order blood tests and X-rays to help determine the seriousness of your pain. However, in many cases, more time must pass before a clear cause of the pain can be found. Before that point, your health care provider may not know if you need more testing or further treatment. HOME CARE INSTRUCTIONS  Monitor your abdominal pain for any changes. The following actions may help to alleviate any discomfort you are experiencing:  Only take over-the-counter or prescription medicines as directed by your health care provider.  Do not take laxatives unless directed to do so by your health care provider.  Try a clear liquid diet (broth, tea, or water) as directed by your health care provider. Slowly move to a bland diet as tolerated. SEEK MEDICAL CARE IF:  You have unexplained abdominal pain.  You have abdominal pain associated with nausea or diarrhea.  You have pain when you urinate or have a bowel movement.  You experience abdominal pain that wakes you in the night.  You have abdominal pain that is worsened or improved by eating food.  You have abdominal pain that is worsened with eating fatty foods. SEEK IMMEDIATE MEDICAL CARE IF:   Your pain does not go away within 2 hours.  You have a fever.  You keep throwing up (vomiting).  Your pain is felt only in portions of the abdomen, such as the right side or the left lower portion of the abdomen.  You pass bloody or black tarry stools. MAKE SURE YOU:  Understand these instructions.   Will watch your condition.   Will get help right away if you are not doing well or get worse.  Document Released:  10/24/2004 Document Revised: 11/04/2012 Document Reviewed: 09/23/2012 [facility] Patient Information 2014 [place].  Acetaminophen; Oxycodone tablets What is this medicine? ACETAMINOPHEN; OXYCODONE (a set a MEE noe fen; ox i KOE done) is a pain reliever. It is used to treat mild to moderate pain. This medicine may be used for other purposes; ask your health care provider or pharmacist if you have questions. COMMON BRAND NAME(S): Endocet, Magnacet, Narvox, Percocet, Perloxx, Primalev, Primlev, Roxicet, Xolox What should I tell my health care provider before I take this medicine? They need to know if you have any of these conditions: -brain tumor -Crohn's disease, inflammatory bowel disease, or ulcerative colitis -drug abuse or addiction -head injury -heart or circulation problems -if you often drink alcohol -kidney disease or problems going to the bathroom -liver disease -lung disease, asthma, or breathing problems -an unusual or allergic reaction to acetaminophen, oxycodone, other opioid analgesics, other medicines, foods, dyes, or preservatives -pregnant or trying to get pregnant -breast-feeding How should I use this medicine? Take this medicine by mouth with a full glass of water. Follow the directions on the prescription label. Take your medicine at regular intervals. Do not take your medicine more often than directed. Talk to your pediatrician regarding the use of this medicine in children. Special care may be needed. Patients over 68 years old may have a stronger reaction and need a smaller dose. Overdosage: If you think you have taken too much of this medicine contact a poison control  center or emergency room at once. NOTE: This medicine is only for you. Do not share this medicine with others. What if I miss a dose? If you miss a dose, take it as soon as you can. If it is almost time for your next dose, take only that dose. Do not take double or extra doses. What may interact  with this medicine? -alcohol -antihistamines -barbiturates like amobarbital, butalbital, butabarbital, methohexital, pentobarbital, phenobarbital, thiopental, and secobarbital -benztropine -drugs for bladder problems like solifenacin, trospium, oxybutynin, tolterodine, hyoscyamine, and methscopolamine -drugs for breathing problems like ipratropium and tiotropium -drugs for certain stomach or intestine problems like propantheline, homatropine methylbromide, glycopyrrolate, atropine, belladonna, and dicyclomine -general anesthetics like etomidate, ketamine, nitrous oxide, propofol, desflurane, enflurane, halothane, isoflurane, and sevoflurane -medicines for depression, anxiety, or psychotic disturbances -medicines for sleep -muscle relaxants -naltrexone -narcotic medicines (opiates) for pain -phenothiazines like perphenazine, thioridazine, chlorpromazine, mesoridazine, fluphenazine, prochlorperazine, promazine, and trifluoperazine -scopolamine -tramadol -trihexyphenidyl This list may not describe all possible interactions. Give your health care provider a list of all the medicines, herbs, non-prescription drugs, or dietary supplements you use. Also tell them if you smoke, drink alcohol, or use illegal drugs. Some items may interact with your medicine. What should I watch for while using this medicine? Tell your doctor or health care professional if your pain does not go away, if it gets worse, or if you have new or a different type of pain. You may develop tolerance to the medicine. Tolerance means that you will need a higher dose of the medication for pain relief. Tolerance is normal and is expected if you take this medicine for a long time. Do not suddenly stop taking your medicine because you may develop a severe reaction. Your body becomes used to the medicine. This does NOT mean you are addicted. Addiction is a behavior related to getting and using a drug for a non-medical reason. If you have  pain, you have a medical reason to take pain medicine. Your doctor will tell you how much medicine to take. If your doctor wants you to stop the medicine, the dose will be slowly lowered over time to avoid any side effects. You may get drowsy or dizzy. Do not drive, use machinery, or do anything that needs mental alertness until you know how this medicine affects you. Do not stand or sit up quickly, especially if you are an older patient. This reduces the risk of dizzy or fainting spells. Alcohol may interfere with the effect of this medicine. Avoid alcoholic drinks. There are different types of narcotic medicines (opiates) for pain. If you take more than one type at the same time, you may have more side effects. Give your health care provider a list of all medicines you use. Your doctor will tell you how much medicine to take. Do not take more medicine than directed. Call emergency for help if you have problems breathing. The medicine will cause constipation. Try to have a bowel movement at least every 2 to 3 days. If you do not have a bowel movement for 3 days, call your doctor or health care professional. Do not take Tylenol (acetaminophen) or medicines that have acetaminophen with this medicine. Too much acetaminophen can be very dangerous. Many nonprescription medicines contain acetaminophen. Always read the labels carefully to avoid taking more acetaminophen. What side effects may I notice from receiving this medicine? Side effects that you should report to your doctor or health care professional as soon as possible: -allergic reactions like skin rash,  itching or hives, swelling of the face, lips, or tongue -breathing difficulties, wheezing -confusion -light headedness or fainting spells -severe stomach pain -unusually weak or tired -yellowing of the skin or the whites of the eyes  Side effects that usually do not require medical attention (report to your doctor or health care professional if  they continue or are bothersome): -dizziness -drowsiness -nausea -vomiting This list may not describe all possible side effects. Call your doctor for medical advice about side effects. You may report side effects to FDA at 1-800-FDA-1088. Where should I keep my medicine? Keep out of the reach of children. This medicine can be abused. Keep your medicine in a safe place to protect it from theft. Do not share this medicine with anyone. Selling or giving away this medicine is dangerous and against the law. Store at room temperature between 20 and 25 degrees C (68 and 77 degrees F). Keep container tightly closed. Protect from light. This medicine may cause accidental overdose and death if it is taken by other adults, children, or pets. Flush any unused medicine down the toilet to reduce the chance of harm. Do not use the medicine after the expiration date. NOTE: This sheet is a summary. It may not cover all possible information. If you have questions about this medicine, talk to your doctor, pharmacist, or health care provider.  2014, [employer]. (2012-09-07 13:17:35)  Ondansetron tablets What is this medicine? ONDANSETRON (on DAN se tron) is used to treat nausea and vomiting caused by chemotherapy. It is also used to prevent or treat nausea and vomiting after surgery. This medicine may be used for other purposes; ask your health care provider or pharmacist if you have questions. COMMON BRAND NAME(S): Zofran What should I tell my health care provider before I take this medicine? They need to know if you have any of these conditions: -heart disease -history of irregular heartbeat -liver disease -low levels of magnesium or potassium in the blood -an unusual or allergic reaction to ondansetron, granisetron, other medicines, foods, dyes, or preservatives -pregnant or trying to get pregnant -breast-feeding How should I use this medicine? Take this medicine by mouth with a glass of  water. Follow the directions on your prescription label. Take your doses at regular intervals. Do not take your medicine more often than directed. Talk to your pediatrician regarding the use of this medicine in children. Special care may be needed. Overdosage: If you think you have taken too much of this medicine contact a poison control center or emergency room at once. NOTE: This medicine is only for you. Do not share this medicine with others. What if I miss a dose? If you miss a dose, take it as soon as you can. If it is almost time for your next dose, take only that dose. Do not take double or extra doses. What may interact with this medicine? Do not take this medicine with any of the following medications: -apomorphine -certain medicines for fungal infections like fluconazole, itraconazole, ketoconazole, posaconazole, voriconazole -cisapride -dofetilide -dronedarone -pimozide -thioridazine -ziprasidone  This medicine may also interact with the following medications: -carbamazepine -certain medicines for depression, anxiety, or psychotic disturbances -fentanyl -linezolid -MAOIs like Carbex, Eldepryl, Marplan, Nardil, and Parnate -methylene blue (injected into a vein) -other medicines that prolong the QT interval (cause an abnormal heart rhythm) -phenytoin -rifampicin -tramadol This list may not describe all possible interactions. Give your health care provider a list of all the medicines, herbs, non-prescription drugs, or dietary supplements you use.  Also tell them if you smoke, drink alcohol, or use illegal drugs. Some items may interact with your medicine. What should I watch for while using this medicine? Check with your doctor or health care professional right away if you have any sign of an allergic reaction. What side effects may I notice from receiving this medicine? Side effects that you should report to your doctor or health care professional as soon as  possible: -allergic reactions like skin rash, itching or hives, swelling of the face, lips or tongue -breathing problems -confusion -dizziness -fast or irregular heartbeat -feeling faint or lightheaded, falls -fever and chills -loss of balance or coordination -seizures -sweating -swelling of the hands or feet -tightness in the chest -tremors -unusually weak or tired Side effects that usually do not require medical attention (report to your doctor or health care professional if they continue or are bothersome): -constipation or diarrhea -headache This list may not describe all possible side effects. Call your doctor for medical advice about side effects. You may report side effects to FDA at 1-800-FDA-1088. Where should I keep my medicine? Keep out of the reach of children. Store between 2 and 30 degrees C (36 and 86 degrees F). Throw away any unused medicine after the expiration date. NOTE: This sheet is a summary. It may not cover all possible information. If you have questions about this medicine, talk to your doctor, pharmacist, or health care provider.  2014, [employer]. (2012-10-21 16:27:45)  Prednisone tablets What is this medicine? PREDNISONE (PRED ni sone) is a corticosteroid. It is commonly used to treat inflammation of the skin, joints, lungs, and other organs. Common conditions treated include asthma, allergies, and arthritis. It is also used for other conditions, such as blood disorders and diseases of the adrenal glands. This medicine may be used for other purposes; ask your health care provider or pharmacist if you have questions. COMMON BRAND NAME(S): Deltasone, Predone, Sterapred DS, Sterapred What should I tell my health care provider before I take this medicine? They need to know if you have any of these conditions: -Cushing's syndrome -diabetes -glaucoma -heart disease -high blood pressure -infection (especially a virus infection such as  chickenpox, cold sores, or herpes) -kidney disease -liver disease -mental illness -myasthenia gravis -osteoporosis -seizures -stomach or intestine problems -thyroid disease -an unusual or allergic reaction to lactose, prednisone, other medicines, foods, dyes, or preservatives -pregnant or trying to get pregnant -breast-feeding How should I use this medicine? Take this medicine by mouth with a glass of water. Follow the directions on the prescription label. Take this medicine with food. If you are taking this medicine once a day, take it in the morning. Do not take more medicine than you are told to take. Do not suddenly stop taking your medicine because you may develop a severe reaction. Your doctor will tell you how much medicine to take. If your doctor wants you to stop the medicine, the dose may be slowly lowered over time to avoid any side effects. Talk to your pediatrician regarding the use of this medicine in children. Special care may be needed. Overdosage: If you think you have taken too much of this medicine contact a poison control center or emergency room at once. NOTE: This medicine is only for you. Do not share this medicine with others. What if I miss a dose? If you miss a dose, take it as soon as you can. If it is almost time for your next dose, talk to your doctor or health  care professional. You may need to miss a dose or take an extra dose. Do not take double or extra doses without advice. What may interact with this medicine? Do not take this medicine with any of the following medications: -metyrapone -mifepristone This medicine may also interact with the following medications: -aminoglutethimide -amphotericin B -aspirin and aspirin-like medicines -barbiturates -certain medicines for diabetes, like glipizide or glyburide -cholestyramine -cholinesterase inhibitors -cyclosporine -digoxin -diuretics -ephedrine -male hormones, like estrogens and birth control  pills -isoniazid -ketoconazole -NSAIDS, medicines for pain and inflammation, like ibuprofen or naproxen -phenytoin -rifampin -toxoids -vaccines -warfarin This list may not describe all possible interactions. Give your health care provider a list of all the medicines, herbs, non-prescription drugs, or dietary supplements you use. Also tell them if you smoke, drink alcohol, or use illegal drugs. Some items may interact with your medicine. What should I watch for while using this medicine? Visit your doctor or health care professional for regular checks on your progress. If you are taking this medicine over a prolonged period, carry an identification card with your name and address, the type and dose of your medicine, and your doctor's name and address. This medicine may increase your risk of getting an infection. Tell your doctor or health care professional if you are around anyone with measles or chickenpox, or if you develop sores or blisters that do not heal properly. If you are going to have surgery, tell your doctor or health care professional that you have taken this medicine within the last twelve months. Ask your doctor or health care professional about your diet. You may need to lower the amount of salt you eat. This medicine may affect blood sugar levels. If you have diabetes, check with your doctor or health care professional before you change your diet or the dose of your diabetic medicine. What side effects may I notice from receiving this medicine? Side effects that you should report to your doctor or health care professional as soon as possible: -allergic reactions like skin rash, itching or hives, swelling of the face, lips, or tongue -changes in emotions or moods -changes in vision -depressed mood -eye pain -fever or chills, cough, sore throat, pain or difficulty passing urine -increased thirst -swelling of ankles, feet Side effects that usually do not require medical  attention (report to your doctor or health care professional if they continue or are bothersome): -confusion, excitement, restlessness -headache -nausea, vomiting -skin problems, acne, thin and shiny skin -trouble sleeping -weight gain This list may not describe all possible side effects. Call your doctor for medical advice about side effects. You may report side effects to FDA at 1-800-FDA-1088. Where should I keep my medicine? Keep out of the reach of children. Store at room temperature between 15 and 30 degrees C (59 and 86 degrees F). Protect from light. Keep container tightly closed. Throw away any unused medicine after the expiration date. NOTE: This sheet is a summary. It may not cover all possible information. If you have questions about this medicine, talk to your doctor, pharmacist, or health care provider.  2014, [employer]. (2010-08-30 10:57:14)

## 2013-07-05 NOTE — ED Notes (Signed)
Pt states he was seen here approximately 3 weeks ago for a Crohn's flare up, and was told to come back if his pain did not subside. Pt states that he is unable to make an appointment with his PCP. Pt reports nausea, vomiting and diarrhea.

## 2013-07-05 NOTE — ED Provider Notes (Signed)
CSN: [id number]     Arrival date & time 07/05/13  0018 History   First MD Initiated Contact with Patient 07/05/13 0201     Chief Complaint  Patient presents with  . Crohn's Disease     (Consider location/radiation/quality/duration/timing/severity/associated sxs/prior Treatment) The history is provided by the patient.   43 year old male with history of Crohn's disease comes in because of on going problems with Donald pain, nausea, vomiting, diarrhea. He was seen in the ED 2 weeks ago and diagnosed with an exacerbation of his Crohn's disease. He was sent home with prescription for oxycodone-acetaminophen. While at home, oxycodone-acetaminophen did give partial relief of pain but he had ongoing problems with nausea, vomiting, diarrhea and anorexia. There's been no blood in his is stool. There have been a few streaks of blood in his emesis. He denies fever, chills, sweats. He has run out of his oxycodone acetaminophen. He is complaining of pain in the epigastric area with radiation to both flanks. Pain is severe and he rates it 10/10. Nothing makes it better nothing makes it worse. He denies fever or chills. He states he tried to get a followup appointment with his GI physician at [facility] but his physician is on vacation.  Past Medical History  Diagnosis Date  . Crohn disease   . Chronic neck pain   . Chronic abdominal pain   . Cervical radiculopathy   . Shortness of breath     "sometimes laying down; sometimes w/activity" (10/13/2012)  . Hypertension     no meds   . Cold     2 weeks ago with cough  still   . GERD (gastroesophageal reflux disease)   . IEPPIRJJ(884.1)    Past Surgical History  Procedure Laterality Date  . Bowel resection      x2  . Nevus excision      left arm + hand  . Colon surgery      drains placed to drain cyst  . Anterior cervical decomp/discectomy fusion N/A 02/12/2013    Procedure: ANTERIOR CERVICAL DECOMPRESSION/DISCECTOMY FUSION 1 LEVEL;  Surgeon: Winfield Cunas,  MD;  Location: [facility] [place] [facility];  Service: Neurosurgery;  Laterality: N/A;  C67 anterior cervical decompression with fusion plating and bonegraft   History reviewed. No pertinent family history. History  Substance Use Topics  . Smoking status: Current Every Day Smoker -- 0.75 packs/day for 22 years    Types: Cigarettes  . Smokeless tobacco: Not on file  . Alcohol Use: No    Review of Systems  All other systems reviewed and are negative.     Allergies  Hydrocodone-acetaminophen; Tramadol; and Morphine and related  Home Medications   Prior to Admission medications   Medication Sig Start Date End Date Taking? Authorizing Provider  HUMIRA PEN 40 MG/0.8ML injection Inject 40 mg as directed every 14 (fourteen) days.  03/22/13  Yes Historical Provider, MD  mercaptopurine (PURINETHOL) 50 MG tablet Take 150 mg by mouth daily. Give on an empty stomach 1 hour before or 2 hours after meals. Caution: Chemotherapy.   Yes Historical Provider, MD  oxyCODONE-acetaminophen (PERCOCET) 5-325 MG per tablet Take 1 tablet by mouth every 4 (four) hours as needed. 06/21/13  Yes Elyn Peers, MD   BP 126/73  Pulse 61  Temp(Src) 97.8 F (36.6 C) (Oral)  Resp 16  Ht 5' 11"  (1.803 m)  Wt 130 lb (58.968 kg)  BMI 18.14 kg/m2  SpO2 99% Physical Exam  Nursing note and vitals reviewed.  43 year old male,  resting comfortably and in no acute distress. Vital signs are normal. Oxygen saturation is 99%, which is normal. Head is normocephalic and atraumatic. PERRLA, EOMI. Oropharynx is clear. Neck is nontender and supple without adenopathy or JVD. Back is nontender and there is no CVA tenderness. Lungs are clear without rales, wheezes, or rhonchi. Chest is nontender. Heart has regular rate and rhythm without murmur. Abdomen is soft, flat, with mild/moderate epigastric tenderness. There is no rebound or guarding. There are no masses or hepatosplenomegaly and peristalsis is hypoactive. Extremities have no cyanosis or  edema, full range of motion is present. Skin is warm and dry without rash. Neurologic: Mental status is normal, cranial nerves are intact, there are no motor or sensory deficits.  ED Course  Procedures (including critical care time) Labs Review Results for orders placed during the hospital encounter of 07/05/13  CBC WITH DIFFERENTIAL      Result Value Ref Range   WBC 8.6  4.0 - 10.5 K/uL   RBC 3.54 (*) 4.22 - 5.81 MIL/uL   Hemoglobin 11.3 (*) 13.0 - 17.0 g/dL   HCT 33.6 (*) 39.0 - 52.0 %   MCV 94.9  78.0 - 100.0 fL   MCH 31.9  26.0 - 34.0 pg   MCHC 33.6  30.0 - 36.0 g/dL   RDW 13.4  11.5 - 15.5 %   Platelets 179  150 - 400 K/uL   Neutrophils Relative % 56  43 - 77 %   Neutro Abs 4.9  1.7 - 7.7 K/uL   Lymphocytes Relative 33  12 - 46 %   Lymphs Abs 2.9  0.7 - 4.0 K/uL   Monocytes Relative 8  3 - 12 %   Monocytes Absolute 0.7  0.1 - 1.0 K/uL   Eosinophils Relative 2  0 - 5 %   Eosinophils Absolute 0.1  0.0 - 0.7 K/uL   Basophils Relative 1  0 - 1 %   Basophils Absolute 0.0  0.0 - 0.1 K/uL  COMPREHENSIVE METABOLIC PANEL      Result Value Ref Range   Sodium 138  137 - 147 mEq/L   Potassium 3.6 (*) 3.7 - 5.3 mEq/L   Chloride 101  96 - 112 mEq/L   CO2 27  19 - 32 mEq/L   Glucose, Bld 100 (*) 70 - 99 mg/dL   BUN 10  6 - 23 mg/dL   Creatinine, Ser 1.10  0.50 - 1.35 mg/dL   Calcium 8.8  8.4 - 10.5 mg/dL   Total Protein 6.2  6.0 - 8.3 g/dL   Albumin 3.1 (*) 3.5 - 5.2 g/dL   AST 15  0 - 37 U/L   ALT 8  0 - 53 U/L   Alkaline Phosphatase 89  39 - 117 U/L   Total Bilirubin 0.5  0.3 - 1.2 mg/dL   GFR calc non Af Amer 82 (*) >90 mL/min   GFR calc Af Amer >90  >90 mL/min  LIPASE, BLOOD      Result Value Ref Range   Lipase 29  11 - 59 U/L  I-STAT CG4 LACTIC ACID, ED      Result Value Ref Range   Lactic Acid, Venous 0.80  0.5 - 2.2 mmol/L   Imaging Review Dg Abd Acute W/chest  07/05/2013   CLINICAL DATA:  Crohn's disease with flare up 3 weeks ago. Persistent pain, nausea,  vomiting, and diarrhea.  EXAM: ACUTE ABDOMEN SERIES (ABDOMEN 2 VIEW & CHEST 1 VIEW)  COMPARISON:  Chest 02/10/2013.  FINDINGS: There is no evidence of dilated bowel loops or free intraperitoneal air. No radiopaque calculi or other significant radiographic abnormality is seen. Heart size and mediastinal contours are within normal limits. Both lungs are clear. Postoperative changes in the cervical spine. Surgical clips in the right lower quadrant abdomen.  IMPRESSION: No evidence of active pulmonary disease. Nonobstructive bowel gas pattern.   Electronically Signed   By: Lucienne Capers M.D.   On: 07/05/2013 02:45    MDM   Final diagnoses:  Chronic abdominal pain  Crohn's disease  Nausea vomiting and diarrhea  Anemia    Abdominal pain in patient with known history of Crohn's disease but also history of chronic abdominal pain. Old records are reviewed and he has had hospital admissions for Crohn's ileitis-most recently last September. At ED visit on May 25, he was given a dose of methylprednisolone but was not discharged with prescription for prednisone. He did receive a prescription for oxycodone-acetaminophen. His record on [place] controlled substance reporting website was reviewed and he has had 2 prescriptions for oxycodone-acetaminophen 20 tablets filled in the last 4 months.  He did get adequate pain relief after 2 narcotic injections. Abdominal x-rays are negative. With rather benign abdomen and essentially normal laboratory workup, it was felt that he did not need advanced imaging at this point. He is discharged with prescription for prednisone, oxycodone-acetaminophen, and ondansetron and he is to followup with his gastroenterologist in the next several days. He was given a dose of methylprednisolone in the ED.  [name], MD 70/05/25 9102

## 2013-07-27 ENCOUNTER — Emergency Department: Payer: Self-pay | Admitting: Emergency Medicine

## 2013-10-15 ENCOUNTER — Emergency Department (HOSPITAL_COMMUNITY)
Admission: EM | Admit: 2013-10-15 | Discharge: 2013-10-15 | Disposition: A | Discharge status: Home / Self Care | Payer: Medicare Other | Attending: Emergency Medicine | Admitting: Emergency Medicine

## 2013-10-15 ENCOUNTER — Encounter (HOSPITAL_COMMUNITY): Payer: Self-pay | Admitting: Emergency Medicine

## 2013-10-15 DIAGNOSIS — Z79899 Other long term (current) drug therapy: Secondary | ICD-10-CM | POA: Insufficient documentation

## 2013-10-15 DIAGNOSIS — Z8739 Personal history of other diseases of the musculoskeletal system and connective tissue: Secondary | ICD-10-CM | POA: Diagnosis not present

## 2013-10-15 DIAGNOSIS — F17200 Nicotine dependence, unspecified, uncomplicated: Secondary | ICD-10-CM | POA: Insufficient documentation

## 2013-10-15 DIAGNOSIS — H00019 Hordeolum externum unspecified eye, unspecified eyelid: Secondary | ICD-10-CM | POA: Diagnosis not present

## 2013-10-15 DIAGNOSIS — I10 Essential (primary) hypertension: Secondary | ICD-10-CM | POA: Diagnosis not present

## 2013-10-15 DIAGNOSIS — H5710 Ocular pain, unspecified eye: Secondary | ICD-10-CM | POA: Diagnosis present

## 2013-10-15 DIAGNOSIS — G8929 Other chronic pain: Secondary | ICD-10-CM | POA: Diagnosis not present

## 2013-10-15 DIAGNOSIS — K5090 Crohn's disease, unspecified, without complications: Secondary | ICD-10-CM | POA: Insufficient documentation

## 2013-10-15 DIAGNOSIS — H00013 Hordeolum externum right eye, unspecified eyelid: Secondary | ICD-10-CM

## 2013-10-15 MED ORDER — ERYTHROMYCIN 5 MG/GM OP OINT: 1.0000 "application " | TOPICAL_OINTMENT | Freq: Four times a day (QID) | OPHTHALMIC | Status: DC

## 2013-10-15 MED ORDER — TETRACAINE HCL 0.5 % OP SOLN
1.0000 [drp] | Freq: Once | OPHTHALMIC | Status: AC
  Administered 2013-10-15: 1 [drp] via OPHTHALMIC
  Filled 2013-10-15: qty 2

## 2013-10-15 MED ORDER — FLUORESCEIN SODIUM 1 MG OP STRP
1.0000 | ORAL_STRIP | Freq: Once | OPHTHALMIC | Status: AC
  Administered 2013-10-15: 1 via OPHTHALMIC
  Filled 2013-10-15: qty 1

## 2013-10-15 NOTE — Discharge Instructions (Signed)

## 2013-10-15 NOTE — ED Notes (Signed)
Pt presents with swollen abnormality to R upper eyelid. States it has been there x week but just began hurting and is keeping him up at night.

## 2013-10-15 NOTE — ED Provider Notes (Signed)
CSN: [id number]     Arrival date & time 10/15/13  1430 History  This chart was scribed for non-physician practitioner Starlyn Skeans, PA-C working with Shaune Pollack, MD by Zola Button, ED Scribe. This patient was seen in room TR04C/TR04C and the patient's care was started at 3:35 PM.     Chief Complaint  Patient presents with  . Eye Pain      The history is provided by the patient. No language interpreter was used.   HPI Comments: Benjamin Murillo is a 43 y.o. male who presents to the Emergency Department complaining of pressurizing, scratching eye pain without drainage but with swollen abnormality to his right upper eyelid that started a week ago but has worsened over the past couple of days. The right eye is erythematous and the pain is described as a 10/10 in severity. He notes associated difficulty with vision in the right eye, some eye running in right eye when in the sun, HA that started 2 days ago. He states that it feels like there is something in his right eye, but says that his left eye feels normal. Patient denies chills, nausea, fever, and vomiting. He denies wearing contacts or glasses and has not seen an optometrist for a while nor does he have a PCP. Patient notes that his nephew has been sick. He is a 3 cigarette/day smoker who is planning on quitting.   Past Medical History  Diagnosis Date  . Crohn disease   . Chronic neck pain   . Chronic abdominal pain   . Cervical radiculopathy   . Shortness of breath     "sometimes laying down; sometimes w/activity" (10/13/2012)  . Hypertension     no meds   . Cold     2 weeks ago with cough  still   . GERD (gastroesophageal reflux disease)   . [id number](478.2)    Past Surgical History  Procedure Laterality Date  . Bowel resection      x2  . Nevus excision      left arm + hand  . Colon surgery      drains placed to drain cyst  . Anterior cervical decomp/discectomy fusion N/A 02/12/2013    Procedure: ANTERIOR CERVICAL  DECOMPRESSION/DISCECTOMY FUSION 1 LEVEL;  Surgeon: Winfield Cunas, MD;  Location: [place] [facility];  Service: Neurosurgery;  Laterality: N/A;  C67 anterior cervical decompression with fusion plating and bonegraft   History reviewed. No pertinent family history. History  Substance Use Topics  . Smoking status: Current Every Day Smoker -- 0.75 packs/day for 22 years    Types: Cigarettes  . Smokeless tobacco: Not on file  . Alcohol Use: No    Review of Systems See HPI. A complete 10 system review of systems was obtained and all systems are negative except as noted in the HPI and PMH.     Allergies  Hydrocodone-acetaminophen; Tramadol; and Morphine and related  Home Medications   Prior to Admission medications   Medication Sig Start Date End Date Taking? Authorizing Provider  mercaptopurine (PURINETHOL) 50 MG tablet Take 150 mg by mouth daily. Give on an empty stomach 1 hour before or 2 hours after meals. Caution: Chemotherapy.   Yes Historical Provider, MD  erythromycin [facility]) ophthalmic ointment Place 1 application into the right eye 4 (four) times daily. 10/15/13   Allyn Bertoni A Forcucci, PA-C  HUMIRA PEN 40 MG/0.8ML injection Inject 40 mg as directed every 14 (fourteen) days.  03/22/13   Historical Provider, MD  BP 135/84  Pulse 72  Temp(Src) 98.2 F (36.8 C) (Oral)  Resp 16  SpO2 100% Physical Exam  Nursing note and vitals reviewed. Constitutional: He is oriented to person, place, and time. He appears well-developed and well-nourished. No distress.  HENT:  Head: Normocephalic and atraumatic.  Mouth/Throat: Oropharynx is clear and moist. No oropharyngeal exudate.  Eyes: Conjunctivae and EOM are normal. Pupils are equal, round, and reactive to light. Right eye exhibits no discharge. Left eye exhibits no discharge. No scleral icterus.  There is a 0.5 mm firm flesh colored nodule to the right upper eyelid.  Nodule is tender to palpation with fluctuance or active discharge.  The right  eye has no increased fluorescein dye uptake under woods lamp examination.  There is no evidence of corneal abrasions or foreign body sensation.      Neck: Neck supple. No JVD present. No thyromegaly present.  Cardiovascular: Normal rate, regular rhythm, normal heart sounds and intact distal pulses.  Exam reveals no gallop and no friction rub.   No murmur heard. Pulmonary/Chest: Effort normal and breath sounds normal. No respiratory distress. He has no wheezes. He has no rales. He exhibits no tenderness.  Musculoskeletal: He exhibits no edema.  Lymphadenopathy:    He has no cervical adenopathy.  Neurological: He is alert and oriented to person, place, and time. No cranial nerve deficit.  Skin: Skin is warm and dry.  Psychiatric: He has a normal mood and affect. His behavior is normal. Judgment and thought content normal.    ED Course  Procedures  DIAGNOSTIC STUDIES: Oxygen Saturation is 100% on RA, nml by my interpretation.    COORDINATION OF CARE: 3:43 PM-Discussed treatment plan which includes fluorescein dye test, tetracaine drops and woods lamp with pt at bedside and pt agreed to plan.   Labs Review Labs Reviewed - No data to display  Imaging Review No results found.   EKG Interpretation None      MDM   Final diagnoses:  Stye, right   Patient is a 43 y.o. Male who presents to the ED with right eye pain.  Physical examination reveals hordeoleum to the right eye.  Vision appears to be 20/200 and 20/100 right and left, and 20/50 bilaterally.  Patient admits to some difficulty with distance vision at baseline.  Doubt corneal abrasion, anterior chamber dysfunction, or periorbital cellulitis at this time given physical exam.  Suspect that this is likely a hordeoleum.   Will discharge the patient home with erythromycin ointment and have encouraged warm compresses.  Patient to follow-up with ophthalmology on Monday.  Patient is stable for discharge at this time.  Patient states  understanding and agreement on the above plan.  Patient to return for periorbital cellulitis symptoms, changes in vision, or intractable pain.    I personally performed the services described in this documentation, which was scribed in my presence. The recorded information has been reviewed and is accurate.    Cherylann Parr, PA-C 10/15/13 [phone]

## 2013-10-16 NOTE — ED Provider Notes (Signed)
Medical screening examination/treatment/procedure(s) were performed by non-physician practitioner and as supervising physician I was immediately available for consultation/collaboration.  Ernestina Patches, MD 10/16/13 1025

## 2014-01-03 ENCOUNTER — Emergency Department (HOSPITAL_COMMUNITY): Payer: Medicare Other

## 2014-01-03 ENCOUNTER — Encounter (HOSPITAL_COMMUNITY): Payer: Self-pay | Admitting: Physical Medicine and Rehabilitation

## 2014-01-03 ENCOUNTER — Inpatient Hospital Stay (HOSPITAL_COMMUNITY)
Admission: EM | Admit: 2014-01-03 | Discharge: 2014-01-06 | DRG: 385 | Disposition: A | Discharge status: Home / Self Care | Payer: Medicare Other | Attending: Family Medicine | Admitting: Family Medicine

## 2014-01-03 DIAGNOSIS — Z681 Body mass index (BMI) 19 or less, adult: Secondary | ICD-10-CM

## 2014-01-03 DIAGNOSIS — K5010 Crohn's disease of large intestine without complications: Secondary | ICD-10-CM | POA: Diagnosis present

## 2014-01-03 DIAGNOSIS — K5090 Crohn's disease, unspecified, without complications: Secondary | ICD-10-CM

## 2014-01-03 DIAGNOSIS — Z981 Arthrodesis status: Secondary | ICD-10-CM

## 2014-01-03 DIAGNOSIS — E43 Unspecified severe protein-calorie malnutrition: Secondary | ICD-10-CM | POA: Diagnosis present

## 2014-01-03 DIAGNOSIS — F17210 Nicotine dependence, cigarettes, uncomplicated: Secondary | ICD-10-CM | POA: Diagnosis present

## 2014-01-03 DIAGNOSIS — R109 Unspecified abdominal pain: Secondary | ICD-10-CM

## 2014-01-03 DIAGNOSIS — Z9119 Patient's noncompliance with other medical treatment and regimen: Secondary | ICD-10-CM | POA: Diagnosis present

## 2014-01-03 DIAGNOSIS — R64 Cachexia: Secondary | ICD-10-CM | POA: Diagnosis present

## 2014-01-03 DIAGNOSIS — K219 Gastro-esophageal reflux disease without esophagitis: Secondary | ICD-10-CM | POA: Diagnosis present

## 2014-01-03 LAB — COMPREHENSIVE METABOLIC PANEL
ALBUMIN: 4 g/dL (ref 3.5–5.2)
ALT: 12 U/L (ref 0–53)
AST: 20 U/L (ref 0–37)
Alkaline Phosphatase: 102 U/L (ref 39–117)
Anion gap: 14 (ref 5–15)
BUN: 12 mg/dL (ref 6–23)
CALCIUM: 9.2 mg/dL (ref 8.4–10.5)
CO2: 22 mEq/L (ref 19–32)
CREATININE: 0.99 mg/dL (ref 0.50–1.35)
Chloride: 100 mEq/L (ref 96–112)
GFR calc non Af Amer: >90 mL/min (ref >90)
GLUCOSE: 78 mg/dL (ref 70–99)
POTASSIUM: 3.9 meq/L (ref 3.7–5.3)
Sodium: 136 mEq/L — ABNORMAL LOW (ref 137–147)
Total Bilirubin: 0.9 mg/dL (ref 0.3–1.2)
Total Protein: 7.3 g/dL (ref 6.0–8.3)

## 2014-01-03 LAB — CBC WITH DIFFERENTIAL/PLATELET
Basophils Absolute: 0 10*3/uL (ref 0.0–0.1)
Basophils Relative: 0 % (ref 0–1)
EOS ABS: 0.1 10*3/uL (ref 0.0–0.7)
EOS PCT: 1 % (ref 0–5)
HCT: 36.1 % — ABNORMAL LOW (ref 39.0–52.0)
HEMOGLOBIN: 12.8 g/dL — AB (ref 13.0–17.0)
LYMPHS ABS: 1.7 10*3/uL (ref 0.7–4.0)
Lymphocytes Relative: 21 % (ref 12–46)
MCH: 33.9 pg (ref 26.0–34.0)
MCHC: 35.5 g/dL (ref 30.0–36.0)
MCV: 95.5 fL (ref 78.0–100.0)
MONOS PCT: 5 % (ref 3–12)
Monocytes Absolute: 0.4 10*3/uL (ref 0.1–1.0)
Neutro Abs: 5.8 10*3/uL (ref 1.7–7.7)
Neutrophils Relative %: 73 % (ref 43–77)
Platelets: 250 10*3/uL (ref 150–400)
RBC: 3.78 MIL/uL — ABNORMAL LOW (ref 4.22–5.81)
RDW: 12.9 % (ref 11.5–15.5)
WBC: 8.1 10*3/uL (ref 4.0–10.5)

## 2014-01-03 LAB — CBC
HEMATOCRIT: 34.4 % — AB (ref 39.0–52.0)
HEMOGLOBIN: 11.7 g/dL — AB (ref 13.0–17.0)
MCH: 32.1 pg (ref 26.0–34.0)
MCHC: 34 g/dL (ref 30.0–36.0)
MCV: 94.2 fL (ref 78.0–100.0)
Platelets: 207 10*3/uL (ref 150–400)
RBC: 3.65 MIL/uL — AB (ref 4.22–5.81)
RDW: 13 % (ref 11.5–15.5)
WBC: 6.8 10*3/uL (ref 4.0–10.5)

## 2014-01-03 LAB — URINALYSIS, ROUTINE W REFLEX MICROSCOPIC
Glucose, UA: NEGATIVE mg/dL
Ketones, ur: 15 mg/dL — AB
LEUKOCYTES UA: NEGATIVE
NITRITE: NEGATIVE
PROTEIN: NEGATIVE mg/dL
SPECIFIC GRAVITY, URINE: 1.034 — AB (ref 1.005–1.030)
UROBILINOGEN UA: 0.2 mg/dL (ref 0.0–1.0)
pH: 5.5 (ref 5.0–8.0)

## 2014-01-03 LAB — LIPASE, BLOOD: LIPASE: 21 U/L (ref 11–59)

## 2014-01-03 LAB — CREATININE, SERUM
Creatinine, Ser: 0.9 mg/dL (ref 0.50–1.35)
GFR calc non Af Amer: >90 mL/min (ref >90)

## 2014-01-03 LAB — URINE MICROSCOPIC-ADD ON

## 2014-01-03 LAB — SEDIMENTATION RATE: SED RATE: 2 mm/h (ref 0–16)

## 2014-01-03 MED ORDER — ONDANSETRON HCL 4 MG/2ML IJ SOLN
4.0000 mg | Freq: Four times a day (QID) | INTRAMUSCULAR | Status: DC | PRN
  Administered 2014-01-04 (×3): 4 mg via INTRAVENOUS
  Filled 2014-01-03 (×3): qty 2

## 2014-01-03 MED ORDER — ONDANSETRON HCL 4 MG PO TABS: 4.0000 mg | ORAL_TABLET | Freq: Four times a day (QID) | ORAL | Status: DC | PRN

## 2014-01-03 MED ORDER — SODIUM CHLORIDE 0.9 % IV SOLN
1000.0000 mL | INTRAVENOUS | Status: DC
  Administered 2014-01-03: 1000 mL via INTRAVENOUS

## 2014-01-03 MED ORDER — MORPHINE SULFATE 4 MG/ML IJ SOLN
4.0000 mg | Freq: Once | INTRAMUSCULAR | Status: AC
  Administered 2014-01-03: 4 mg via INTRAVENOUS
  Filled 2014-01-03: qty 1

## 2014-01-03 MED ORDER — DIPHENHYDRAMINE HCL 50 MG/ML IJ SOLN
25.0000 mg | Freq: Once | INTRAMUSCULAR | Status: AC
  Administered 2014-01-03: 25 mg via INTRAVENOUS
  Filled 2014-01-03: qty 1

## 2014-01-03 MED ORDER — METRONIDAZOLE IN NACL 5-0.79 MG/ML-% IV SOLN
500.0000 mg | Freq: Three times a day (TID) | INTRAVENOUS | Status: DC
  Administered 2014-01-03 – 2014-01-05 (×5): 500 mg via INTRAVENOUS
  Filled 2014-01-03 (×7): qty 100

## 2014-01-03 MED ORDER — HEPARIN SODIUM (PORCINE) 5000 UNIT/ML IJ SOLN
5000.0000 [IU] | Freq: Three times a day (TID) | INTRAMUSCULAR | Status: DC
  Administered 2014-01-03 – 2014-01-06 (×8): 5000 [IU] via SUBCUTANEOUS
  Filled 2014-01-03 (×11): qty 1

## 2014-01-03 MED ORDER — CIPROFLOXACIN IN D5W 400 MG/200ML IV SOLN
400.0000 mg | Freq: Two times a day (BID) | INTRAVENOUS | Status: DC
  Administered 2014-01-04 (×3): 400 mg via INTRAVENOUS
  Filled 2014-01-03 (×5): qty 200

## 2014-01-03 MED ORDER — METHYLPREDNISOLONE SODIUM SUCC 125 MG IJ SOLR
125.0000 mg | Freq: Two times a day (BID) | INTRAMUSCULAR | Status: DC
  Administered 2014-01-04 (×2): 125 mg via INTRAVENOUS
  Filled 2014-01-03 (×4): qty 2

## 2014-01-03 MED ORDER — ONDANSETRON HCL 4 MG/2ML IJ SOLN
4.0000 mg | Freq: Once | INTRAMUSCULAR | Status: AC
  Administered 2014-01-03: 4 mg via INTRAVENOUS
  Filled 2014-01-03: qty 2

## 2014-01-03 MED ORDER — METHYLPREDNISOLONE SODIUM SUCC 125 MG IJ SOLR
125.0000 mg | Freq: Once | INTRAMUSCULAR | Status: AC
  Administered 2014-01-03: 125 mg via INTRAVENOUS
  Filled 2014-01-03: qty 2

## 2014-01-03 MED ORDER — MORPHINE SULFATE 2 MG/ML IJ SOLN
2.0000 mg | INTRAMUSCULAR | Status: DC | PRN
  Administered 2014-01-04 – 2014-01-05 (×6): 4 mg via INTRAVENOUS
  Filled 2014-01-03 (×6): qty 2

## 2014-01-03 MED ORDER — SODIUM CHLORIDE 0.9 % IV SOLN
1000.0000 mL | Freq: Once | INTRAVENOUS | Status: AC
  Administered 2014-01-03: 1000 mL via INTRAVENOUS

## 2014-01-03 MED ORDER — IOHEXOL 300 MG/ML  SOLN
80.0000 mL | Freq: Once | INTRAMUSCULAR | Status: AC | PRN
  Administered 2014-01-03: 80 mL via INTRAVENOUS

## 2014-01-03 MED ORDER — SODIUM CHLORIDE 0.9 % IV SOLN
INTRAVENOUS | Status: DC
  Administered 2014-01-03 – 2014-01-05 (×3): via INTRAVENOUS

## 2014-01-03 NOTE — ED Notes (Signed)
Attempted IV start without success. IV consult placed.

## 2014-01-03 NOTE — ED Provider Notes (Signed)
CSN: [id number]     Arrival date & time 01/03/14  1211 History   First MD Initiated Contact with Patient 01/03/14 1512     Chief Complaint  Patient presents with  . Abdominal Pain  . Nausea     (Consider location/radiation/quality/duration/timing/severity/associated sxs/prior Treatment) Patient is a 43 y.o. male presenting with abdominal pain. The history is provided by the patient.  Abdominal Pain He has a history of Crohn's disease and has been complaining of crampy lower abdominal pain and diarrhea for about the last 2 weeks. Pain is severe and he rates it a 10/10. He has been having 7-10 bowel movements a day. There's been no blood in the bowel movement but there has been mucus. He states that has a bowel movement every time he eats. Pain is worse before a bowel movement but does not improve afterwards. He thinks he has lost weight but is not sure how much. He denies nausea or vomiting. He denies fever, chills but has had some night sweats. Has not been taking anything for pain. He states he has been compliant with taking Humira every 2 weeks. He is seen at a gastroenterology clinic at [facility] but has not been able to get there because of difficulty with transportation.  Past Medical History  Diagnosis Date  . Crohn disease   . Chronic neck pain   . Chronic abdominal pain   . Cervical radiculopathy   . Shortness of breath     "sometimes laying down; sometimes w/activity" (10/13/2012)  . Hypertension     no meds   . Cold     2 weeks ago with cough  still   . GERD (gastroesophageal reflux disease)   . PIRJJOAC(166.0)    Past Surgical History  Procedure Laterality Date  . Bowel resection      x2  . Nevus excision      left arm + hand  . Colon surgery      drains placed to drain cyst  . Anterior cervical decomp/discectomy fusion N/A 02/12/2013    Procedure: ANTERIOR CERVICAL DECOMPRESSION/DISCECTOMY FUSION 1 LEVEL;  Surgeon: Winfield Cunas, MD;  Location: [place]  [facility];  Service: Neurosurgery;  Laterality: N/A;  C67 anterior cervical decompression with fusion plating and bonegraft   History reviewed. No pertinent family history. History  Substance Use Topics  . Smoking status: Current Every Day Smoker -- 0.75 packs/day for 22 years    Types: Cigarettes  . Smokeless tobacco: Not on file  . Alcohol Use: No    Review of Systems  Gastrointestinal: Positive for abdominal pain.  All other systems reviewed and are negative.     Allergies  Hydrocodone-acetaminophen; Tramadol; and Morphine and related  Home Medications   Prior to Admission medications   Medication Sig Start Date End Date Taking? Authorizing Provider  erythromycin [facility]) ophthalmic ointment Place 1 application into the right eye 4 (four) times daily. 10/15/13   Courtney A Forcucci, PA-C  HUMIRA PEN 40 MG/0.8ML injection Inject 40 mg as directed every 14 (fourteen) days.  03/22/13   Historical Provider, MD  mercaptopurine (PURINETHOL) 50 MG tablet Take 150 mg by mouth daily. Give on an empty stomach 1 hour before or 2 hours after meals. Caution: Chemotherapy.    Historical Provider, MD   BP 123/83 mmHg  Pulse 69  Temp(Src) 97.8 F (36.6 C) (Oral)  Resp 18  Ht 5' 11"  (1.803 m)  Wt 119 lb (53.978 kg)  BMI 16.60 kg/m2  SpO2  97% Physical Exam  Nursing note and vitals reviewed.  Somewhat cachectic 43 year old male, resting comfortably and in no acute distress. Vital signs are normal. Oxygen saturation is 97%, which is normal. Head is normocephalic and atraumatic. PERRLA, EOMI. Oropharynx is clear. Neck is nontender and supple without adenopathy or JVD. Back is nontender and there is no CVA tenderness. Lungs are clear without rales, wheezes, or rhonchi. Chest is nontender. Heart has regular rate and rhythm without murmur. Abdomen is soft, flat, with mild to moderate tenderness across the lower abdomen. There is no rebound or guarding. There are no masses or  hepatosplenomegaly and peristalsis is hypoactive. Extremities have no cyanosis or edema, full range of motion is present. Skin is warm and dry without rash. Neurologic: Mental status is normal, cranial nerves are intact, there are no motor or sensory deficits.  ED Course  Procedures (including critical care time) Labs Review Results for orders placed or performed during the hospital encounter of 01/03/14  CBC with Differential  Result Value Ref Range   WBC 8.1 4.0 - 10.5 K/uL   RBC 3.78 (L) 4.22 - 5.81 MIL/uL   Hemoglobin 12.8 (L) 13.0 - 17.0 g/dL   HCT 36.1 (L) 39.0 - 52.0 %   MCV 95.5 78.0 - 100.0 fL   MCH 33.9 26.0 - 34.0 pg   MCHC 35.5 30.0 - 36.0 g/dL   RDW 12.9 11.5 - 15.5 %   Platelets 250 150 - 400 K/uL   Neutrophils Relative % 73 43 - 77 %   Neutro Abs 5.8 1.7 - 7.7 K/uL   Lymphocytes Relative 21 12 - 46 %   Lymphs Abs 1.7 0.7 - 4.0 K/uL   Monocytes Relative 5 3 - 12 %   Monocytes Absolute 0.4 0.1 - 1.0 K/uL   Eosinophils Relative 1 0 - 5 %   Eosinophils Absolute 0.1 0.0 - 0.7 K/uL   Basophils Relative 0 0 - 1 %   Basophils Absolute 0.0 0.0 - 0.1 K/uL  Comprehensive metabolic panel  Result Value Ref Range   Sodium 136 (L) 137 - 147 mEq/L   Potassium 3.9 3.7 - 5.3 mEq/L   Chloride 100 96 - 112 mEq/L   CO2 22 19 - 32 mEq/L   Glucose, Bld 78 70 - 99 mg/dL   BUN 12 6 - 23 mg/dL   Creatinine, Ser 0.99 0.50 - 1.35 mg/dL   Calcium 9.2 8.4 - 10.5 mg/dL   Total Protein 7.3 6.0 - 8.3 g/dL   Albumin 4.0 3.5 - 5.2 g/dL   AST 20 0 - 37 U/L   ALT 12 0 - 53 U/L   Alkaline Phosphatase 102 39 - 117 U/L   Total Bilirubin 0.9 0.3 - 1.2 mg/dL   GFR calc non Af Amer >90 >90 mL/min   GFR calc Af Amer >90 >90 mL/min   Anion gap 14 5 - 15  Lipase, blood  Result Value Ref Range   Lipase 21 11 - 59 U/L  Urinalysis, Routine w reflex microscopic  Result Value Ref Range   Color, Urine YELLOW YELLOW   APPearance CLEAR CLEAR   Specific Gravity, Urine 1.034 (H) 1.005 - 1.030   pH  5.5 5.0 - 8.0   Glucose, UA NEGATIVE NEGATIVE mg/dL   Hgb urine dipstick SMALL (A) NEGATIVE   Bilirubin Urine SMALL (A) NEGATIVE   Ketones, ur 15 (A) NEGATIVE mg/dL   Protein, ur NEGATIVE NEGATIVE mg/dL   Urobilinogen, UA 0.2 0.0 - 1.0 mg/dL  Nitrite NEGATIVE NEGATIVE   Leukocytes, UA NEGATIVE NEGATIVE  Urine microscopic-add on  Result Value Ref Range   Squamous Epithelial / LPF RARE RARE   RBC / HPF 3-6 <3 RBC/hpf   Bacteria, UA RARE RARE   Casts GRANULAR CAST (A) NEGATIVE   Crystals CA OXALATE CRYSTALS (A) NEGATIVE   Urine-Other MUCOUS PRESENT   CBC  Result Value Ref Range   WBC 6.8 4.0 - 10.5 K/uL   RBC 3.65 (L) 4.22 - 5.81 MIL/uL   Hemoglobin 11.7 (L) 13.0 - 17.0 g/dL   HCT 34.4 (L) 39.0 - 52.0 %   MCV 94.2 78.0 - 100.0 fL   MCH 32.1 26.0 - 34.0 pg   MCHC 34.0 30.0 - 36.0 g/dL   RDW 13.0 11.5 - 15.5 %   Platelets 207 150 - 400 K/uL  Creatinine, serum  Result Value Ref Range   Creatinine, Ser 0.90 0.50 - 1.35 mg/dL   GFR calc non Af Amer >90 >90 mL/min   GFR calc Af Amer >90 >90 mL/min  Sedimentation rate  Result Value Ref Range   Sed Rate 2 0 - 16 mm/hr   Ct Abdomen Pelvis W Contrast  01/03/2014   CLINICAL DATA:  Abdominal pain all over, nausea, vomiting, diarrhea, partial history of Crohn's disease and 2 prior bowel resections, hypertension, GERD, smoker  EXAM: CT ABDOMEN AND PELVIS WITH CONTRAST  TECHNIQUE: Multidetector CT imaging of the abdomen and pelvis was performed using the standard protocol following bolus administration of intravenous contrast. Sagittal and coronal MPR images reconstructed from axial data set.  CONTRAST:  74m OMNIPAQUE IOHEXOL 300 MG/ML  SOLN  COMPARISON:  10/13/2012  FINDINGS: Minimal dependent atelectasis at lung bases.  Liver, spleen, pancreas, kidneys, and adrenal glands unremarkable.  Bladder and ureters normal appearance.  Stomach and small bowel loops unremarkable.  Suspect prior ileocolic bowel resection.  Questionable bowel wall  thickening versus artifact from underdistention at the ascending colon.  Remaining colon unremarkable.  Scattered normal size mesenteric nodes.  No mass, adenopathy, free fluid, or free air.  Bones unremarkable.  IMPRESSION: Questionable wall thickening at the ascending colon versus artifact from underdistention, active Crohn's not excluded ; this site has a different appearance than that seen on the previous study.  Remainder of exam unremarkable.   Electronically Signed   By: MLavonia DanaM.D.   On: 01/03/2014 20:14     Imaging Review Ct Abdomen Pelvis W Contrast  01/03/2014   CLINICAL DATA:  Abdominal pain all over, nausea, vomiting, diarrhea, partial history of Crohn's disease and 2 prior bowel resections, hypertension, GERD, smoker  EXAM: CT ABDOMEN AND PELVIS WITH CONTRAST  TECHNIQUE: Multidetector CT imaging of the abdomen and pelvis was performed using the standard protocol following bolus administration of intravenous contrast. Sagittal and coronal MPR images reconstructed from axial data set.  CONTRAST:  871mOMNIPAQUE IOHEXOL 300 MG/ML  SOLN  COMPARISON:  10/13/2012  FINDINGS: Minimal dependent atelectasis at lung bases.  Liver, spleen, pancreas, kidneys, and adrenal glands unremarkable.  Bladder and ureters normal appearance.  Stomach and small bowel loops unremarkable.  Suspect prior ileocolic bowel resection.  Questionable bowel wall thickening versus artifact from underdistention at the ascending colon.  Remaining colon unremarkable.  Scattered normal size mesenteric nodes.  No mass, adenopathy, free fluid, or free air.  Bones unremarkable.  IMPRESSION: Questionable wall thickening at the ascending colon versus artifact from underdistention, active Crohn's not excluded ; this site has a different appearance than that seen on the  previous study.  Remainder of exam unremarkable.   Electronically Signed   By: Lavonia Dana M.D.   On: 01/03/2014 20:14    MDM   Final diagnoses:  Abdominal pain,  unspecified abdominal location  Crohn disease    Abdominal pain and diarrhea which appear to be an exacerbation of Crohn's disease. He has had complications with abscesses so he will need to be sent for CT scan. Old records are reviewed and he is followed at the clinic at [facility] and had been taken off of mercaptopurine because he had not been compliant with it. He was scheduled to have had a follow-up appointment in October but apparently he missed that appointment. He will be sent for CT scan to rule out complications of Crohn's disease. WBC is normal which is not necessarily unexpected.  CT shows thickening of the ascending colon consistent with Crohn's disease. I am concerned that the patient is on he missed appointments with his gastroenterologist at [facility] and would not be able to obtain adequate follow-up. Decision is made to start him on steroids as an inpatient. He is given a dose of methylprednisolone. Case is discussed with Dr. Ernestina Patches of [facility] who agrees to admit the patient.    [name], MD 25/27/12 [id number]

## 2014-01-03 NOTE — H&P (Signed)
Hospitalist Admission History and Physical  Patient name: Benjamin Murillo Medical record number: [id number] Date of birth: [birth year] Age: 43 y.o. Gender: male  Primary Care Provider: Cletis Athens, MD  Chief Complaint: crohns flare  History of Present Illness:This is a 43 y.o. year old male with significant past medical history of crohns disease on humara, tobacco abuse presenting with crohns flare. Pt reports 1-2 weeks of lower abd pain, nausea, NBNB diarrhea consistent with previous crohns flares. Is followed at [facility]. Has been noncompliant with follow up. Last visit 07/2013. Missed most recent appt. Sxs started prior to thanksgiving. Denies any enciting foods. No recent medication changes or sick contacts. States that he has been compliant with humira injections.  Presented to ER T 97.8, HR 40s-70s, resp 10s, BP 100s-140s, Satting >97% on RA. WBC 8.1, hgb 13, Cr 0.99. CT abd and pelvis : Questionable wall thickening at the ascending colon versus artifact from underdistention, active Crohn's not excluded   Assessment and Plan: Benjamin Murillo is a 42 y.o. year old male presenting with Crohns colitis   Active Problems:   Crohn's colitis   1- Crohns colitis -IV solumedrol  -IV cipro and flagyl  -stool studies  -antiemetics -ESR  -GI consult as clinically indicated   FEN/GI: ADAT  Prophylaxis: sub q heparin  Disposition: pending further evaluation  Code Status:Full Code    Patient Active Problem List   Diagnosis Date Noted  . Crohn's colitis 01/03/2014  . HNP (herniated nucleus pulposus), cervical 02/12/2013  . Tobacco abuse 10/14/2012  . Noncompliance 10/14/2012  . Acute abdominal pain 10/13/2012  . Chronic abdominal pain   . Crohn's ileitis 06/06/2012   Past Medical History: Past Medical History  Diagnosis Date  . Crohn disease   . Chronic neck pain   . Chronic abdominal pain   . Cervical radiculopathy   . Shortness of breath     "sometimes laying down; sometimes  w/activity" (10/13/2012)  . Hypertension     no meds   . Cold     2 weeks ago with cough  still   . GERD (gastroesophageal reflux disease)   . MGQQPYPP(509.3)     Past Surgical History: Past Surgical History  Procedure Laterality Date  . Bowel resection      x2  . Nevus excision      left arm + hand  . Colon surgery      drains placed to drain cyst  . Anterior cervical decomp/discectomy fusion N/A 02/12/2013    Procedure: ANTERIOR CERVICAL DECOMPRESSION/DISCECTOMY FUSION 1 LEVEL;  Surgeon: Winfield Cunas, MD;  Location: [place] [facility];  Service: Neurosurgery;  Laterality: N/A;  C67 anterior cervical decompression with fusion plating and bonegraft    Social History: History   Social History  . Marital Status: Single    Spouse Name: N/A    Number of Children: N/A  . Years of Education: N/A   Social History Main Topics  . Smoking status: Current Every Day Smoker -- 0.75 packs/day for 22 years    Types: Cigarettes  . Smokeless tobacco: None  . Alcohol Use: No  . Drug Use: No     Comment: 10/13/2012 "last used marijuana ~ 1 wk ago"  . Sexual Activity: Not Currently   Other Topics Concern  . None   Social History Narrative    Family History: History reviewed. No pertinent family history.  Allergies: Allergies  Allergen Reactions  . Hydrocodone-Acetaminophen Other (See Comments)    Diaphoresis  . Tramadol Other (  See Comments)    Makes patient anxious and nervous.  . Morphine And Related Itching    High doses    Current Facility-Administered Medications  Medication Dose Route Frequency Provider Last Rate Last Dose  . 0.9 %  sodium chloride infusion  1,000 mL Intravenous Continuous [name], MD 469 mL/hr at 01/03/14 1701 1,000 mL at 01/03/14 1701  . 0.9 %  sodium chloride infusion   Intravenous Continuous Shanda Howells, MD      . heparin injection 5,000 Units  5,000 Units Subcutaneous 3 times per day Shanda Howells, MD      . methylPREDNISolone sodium succinate  (SOLU-MEDROL) 125 mg/2 mL injection 125 mg  125 mg Intravenous Q12H Shanda Howells, MD      . morphine 2 MG/ML injection 2-4 mg  2-4 mg Intravenous Q3H PRN Shanda Howells, MD      . ondansetron [facility]) tablet 4 mg  4 mg Oral Q6H PRN Shanda Howells, MD       Or  . ondansetron [facility]) injection 4 mg  4 mg Intravenous Q6H PRN Shanda Howells, MD       Current Outpatient Prescriptions  Medication Sig Dispense Refill  . HUMIRA PEN 40 MG/0.8ML injection Inject 40 mg as directed every 14 (fourteen) days.     Marland Kitchen erythromycin [facility]) ophthalmic ointment Place 1 application into the right eye 4 (four) times daily. (Patient not taking: Reported on 01/03/2014) 3.5 g 0   Review Of Systems: 12 point ROS negative except as noted above in HPI.  Physical Exam: Filed Vitals:   01/03/14 1800  BP: 140/84  Pulse: 56  Temp:   Resp: 17    General: alert and cooperative HEENT: PERRLA and extra ocular movement intact Heart: S1, S2 normal, no murmur, rub or gallop, regular rate and rhythm Lungs: clear to auscultation, no wheezes or rales and unlabored breathing Abdomen: + bowel sounds, mild generalized abd TTP  Extremities: extremities normal, atraumatic, no cyanosis or edema Skin:no rashes Neurology: normal without focal findings  Labs and Imaging: Lab Results  Component Value Date/Time   NA 136* 01/03/2014 12:18 PM   K 3.9 01/03/2014 12:18 PM   CL 100 01/03/2014 12:18 PM   CO2 22 01/03/2014 12:18 PM   BUN 12 01/03/2014 12:18 PM   CREATININE 0.99 01/03/2014 12:18 PM   GLUCOSE 78 01/03/2014 12:18 PM   Lab Results  Component Value Date   WBC 8.1 01/03/2014   HGB 12.8* 01/03/2014   HCT 36.1* 01/03/2014   MCV 95.5 01/03/2014   PLT 250 01/03/2014   Urinalysis    Component Value Date/Time   COLORURINE YELLOW 01/03/2014 1550   APPEARANCEUR CLEAR 01/03/2014 1550   LABSPEC 1.034* 01/03/2014 1550   PHURINE 5.5 01/03/2014 1550   GLUCOSEU NEGATIVE 01/03/2014 1550   HGBUR SMALL* 01/03/2014 1550    BILIRUBINUR SMALL* 01/03/2014 1550   KETONESUR 15* 01/03/2014 1550   PROTEINUR NEGATIVE 01/03/2014 1550   UROBILINOGEN 0.2 01/03/2014 1550   NITRITE NEGATIVE 01/03/2014 1550   LEUKOCYTESUR NEGATIVE 01/03/2014 1550      Ct Abdomen Pelvis W Contrast  01/03/2014   CLINICAL DATA:  Abdominal pain all over, nausea, vomiting, diarrhea, partial history of Crohn's disease and 2 prior bowel resections, hypertension, GERD, smoker  EXAM: CT ABDOMEN AND PELVIS WITH CONTRAST  TECHNIQUE: Multidetector CT imaging of the abdomen and pelvis was performed using the standard protocol following bolus administration of intravenous contrast. Sagittal and coronal MPR images reconstructed from axial data set.  CONTRAST:  71mL  OMNIPAQUE IOHEXOL 300 MG/ML  SOLN  COMPARISON:  10/13/2012  FINDINGS: Minimal dependent atelectasis at lung bases.  Liver, spleen, pancreas, kidneys, and adrenal glands unremarkable.  Bladder and ureters normal appearance.  Stomach and small bowel loops unremarkable.  Suspect prior ileocolic bowel resection.  Questionable bowel wall thickening versus artifact from underdistention at the ascending colon.  Remaining colon unremarkable.  Scattered normal size mesenteric nodes.  No mass, adenopathy, free fluid, or free air.  Bones unremarkable.  IMPRESSION: Questionable wall thickening at the ascending colon versus artifact from underdistention, active Crohn's not excluded ; this site has a different appearance than that seen on the previous study.  Remainder of exam unremarkable.   Electronically Signed   By: Lavonia Dana M.D.   On: 01/03/2014 20:14           Shanda Howells MD  Pager: [phone]

## 2014-01-03 NOTE — ED Notes (Signed)
Pt to CT at this time.

## 2014-01-03 NOTE — ED Notes (Signed)
Pt presents to department for evaluation of abdominal pain, diarrhea and nausea. History of Crohn's. 10/10 abdominal pain at present.

## 2014-01-04 DIAGNOSIS — F17210 Nicotine dependence, cigarettes, uncomplicated: Secondary | ICD-10-CM | POA: Diagnosis present

## 2014-01-04 DIAGNOSIS — R64 Cachexia: Secondary | ICD-10-CM | POA: Diagnosis present

## 2014-01-04 DIAGNOSIS — K50919 Crohn's disease, unspecified, with unspecified complications: Secondary | ICD-10-CM

## 2014-01-04 DIAGNOSIS — K5090 Crohn's disease, unspecified, without complications: Secondary | ICD-10-CM | POA: Diagnosis present

## 2014-01-04 DIAGNOSIS — K219 Gastro-esophageal reflux disease without esophagitis: Secondary | ICD-10-CM | POA: Diagnosis present

## 2014-01-04 DIAGNOSIS — K5010 Crohn's disease of large intestine without complications: Secondary | ICD-10-CM | POA: Diagnosis present

## 2014-01-04 DIAGNOSIS — E43 Unspecified severe protein-calorie malnutrition: Secondary | ICD-10-CM | POA: Diagnosis present

## 2014-01-04 DIAGNOSIS — R109 Unspecified abdominal pain: Secondary | ICD-10-CM | POA: Insufficient documentation

## 2014-01-04 DIAGNOSIS — Z720 Tobacco use: Secondary | ICD-10-CM

## 2014-01-04 DIAGNOSIS — Z981 Arthrodesis status: Secondary | ICD-10-CM | POA: Diagnosis not present

## 2014-01-04 DIAGNOSIS — Z681 Body mass index (BMI) 19 or less, adult: Secondary | ICD-10-CM | POA: Diagnosis not present

## 2014-01-04 DIAGNOSIS — Z9119 Patient's noncompliance with other medical treatment and regimen: Secondary | ICD-10-CM | POA: Diagnosis present

## 2014-01-04 LAB — COMPREHENSIVE METABOLIC PANEL
ALBUMIN: 3.6 g/dL (ref 3.5–5.2)
ALT: 9 U/L (ref 0–53)
AST: 16 U/L (ref 0–37)
Alkaline Phosphatase: 81 U/L (ref 39–117)
Anion gap: 12 (ref 5–15)
BILIRUBIN TOTAL: 1.2 mg/dL (ref 0.3–1.2)
BUN: 7 mg/dL (ref 6–23)
CHLORIDE: 101 meq/L (ref 96–112)
CO2: 22 mEq/L (ref 19–32)
Calcium: 8.7 mg/dL (ref 8.4–10.5)
Creatinine, Ser: 0.86 mg/dL (ref 0.50–1.35)
GFR calc Af Amer: >90 mL/min (ref >90)
GFR calc non Af Amer: >90 mL/min (ref >90)
Glucose, Bld: 112 mg/dL — ABNORMAL HIGH (ref 70–99)
Potassium: 4.6 mEq/L (ref 3.7–5.3)
SODIUM: 135 meq/L — AB (ref 137–147)
Total Protein: 6.6 g/dL (ref 6.0–8.3)

## 2014-01-04 LAB — CBC WITH DIFFERENTIAL/PLATELET
Basophils Absolute: 0 10*3/uL (ref 0.0–0.1)
Basophils Relative: 0 % (ref 0–1)
EOS PCT: 0 % (ref 0–5)
Eosinophils Absolute: 0 10*3/uL (ref 0.0–0.7)
HCT: 35.9 % — ABNORMAL LOW (ref 39.0–52.0)
Hemoglobin: 12.6 g/dL — ABNORMAL LOW (ref 13.0–17.0)
LYMPHS ABS: 0.7 10*3/uL (ref 0.7–4.0)
Lymphocytes Relative: 15 % (ref 12–46)
MCH: 33.4 pg (ref 26.0–34.0)
MCHC: 35.1 g/dL (ref 30.0–36.0)
MCV: 95.2 fL (ref 78.0–100.0)
MONO ABS: 0.1 10*3/uL (ref 0.1–1.0)
Monocytes Relative: 1 % — ABNORMAL LOW (ref 3–12)
Neutro Abs: 4.2 10*3/uL (ref 1.7–7.7)
Neutrophils Relative %: 84 % — ABNORMAL HIGH (ref 43–77)
Platelets: 214 10*3/uL (ref 150–400)
RBC: 3.77 MIL/uL — AB (ref 4.22–5.81)
RDW: 12.8 % (ref 11.5–15.5)
WBC: 5 10*3/uL (ref 4.0–10.5)

## 2014-01-04 MED ORDER — DIPHENHYDRAMINE HCL 25 MG PO CAPS
25.0000 mg | ORAL_CAPSULE | Freq: Three times a day (TID) | ORAL | Status: DC | PRN
Start: 2014-01-04 — End: 2014-01-06
  Administered 2014-01-04: 25 mg via ORAL
  Filled 2014-01-04: qty 1

## 2014-01-04 MED ORDER — BOOST / RESOURCE BREEZE PO LIQD
1.0000 | Freq: Three times a day (TID) | ORAL | Status: DC
  Administered 2014-01-04 – 2014-01-06 (×6): 1 via ORAL

## 2014-01-04 NOTE — Progress Notes (Signed)
INITIAL NUTRITION ASSESSMENT  DOCUMENTATION CODES Per approved criteria  -Underweight   INTERVENTION: Resource Breeze po TID, each supplement provides 250 kcal and 9 grams of protein  NUTRITION DIAGNOSIS: Inadequate oral intake related to Crohn's flare up as evidenced by poor po.   Goal: Pt to meet >/= 90% of their estimated nutrition needs   Monitor:  Weight trend, po intake, acceptance of supplements  Reason for Assessment: MST  43 y.o. male  Admitting Dx: <principal problem not specified>  ASSESSMENT: 43 y.o. year old male with significant past medical history of crohns disease on humara, tobacco abuse presenting with crohns flare.  - Pt with 11 lb wt loss in 6 months. He says that he was eating well prior to Crohn's flare-up. Reported only eating a few bites of breakfast this morning. Pt asking for orange jello. Agreed to try nutritional supplements. Suspect some form of malnutrition, but pt did not meet criteria.  - Pt with mild wasting at the clavicles and temples.  Height: Ht Readings from Last 1 Encounters:  01/03/14 5\' 11"  (1.803 m)    Weight: Wt Readings from Last 1 Encounters:  01/03/14 119 lb (53.978 kg)    Ideal Body Weight: 75.3 kg  % Ideal Body Weight: 72%  Wt Readings from Last 10 Encounters:  01/03/14 119 lb (53.978 kg)  07/05/13 130 lb (58.968 kg)  06/21/13 130 lb (58.968 kg)  04/14/13 120 lb (54.432 kg)  02/12/13 128 lb 1.4 oz (58.1 kg)  02/10/13 128 lb 2 oz (58.117 kg)  10/15/12 119 lb 14.9 oz (54.4 kg)  06/06/12 125 lb (56.7 kg)  03/05/12 125 lb (56.7 kg)  02/27/12 125 lb (56.7 kg)    Usual Body Weight: 130 lbs  % Usual Body Weight: 92%  BMI:  Body mass index is 16.6 kg/(m^2).  Estimated Nutritional Needs: Kcal: 1600-1800 Protein: 80-90 g Fluid: >1.8 L/day  Skin: Intact  Diet Order: Diet Heart  EDUCATION NEEDS: -Education needs addressed   Intake/Output Summary (Last 24 hours) at 01/04/14 1204 Last data filed at 01/04/14  1002  Gross per 24 hour  Intake 1287.08 ml  Output    400 ml  Net 887.08 ml    Last BM: prior to admission   Labs:   Recent Labs Lab 01/03/14 1218 01/03/14 2203 01/04/14 0615  NA 136*  --  135*  K 3.9  --  4.6  CL 100  --  101  CO2 22  --  22  BUN 12  --  7  CREATININE 0.99 0.90 0.86  CALCIUM 9.2  --  8.7  GLUCOSE 78  --  112*    CBG (last 3)  No results for input(s): GLUCAP in the last 72 hours.  Scheduled Meds: . ciprofloxacin  400 mg Intravenous Q12H  . heparin  5,000 Units Subcutaneous 3 times per day  . methylPREDNISolone (SOLU-MEDROL) injection  125 mg Intravenous Q12H  . metronidazole  500 mg Intravenous Q8H    Continuous Infusions: . sodium chloride 125 mL/hr at 01/03/14 2259    Past Medical History  Diagnosis Date  . Crohn disease   . Chronic neck pain   . Chronic abdominal pain   . Cervical radiculopathy   . Shortness of breath     "sometimes laying down; sometimes w/activity" (10/13/2012)  . Hypertension     no meds   . Cold     2 weeks ago with cough  still   . GERD (gastroesophageal reflux disease)   . Headache(784.0)  Past Surgical History  Procedure Laterality Date  . Bowel resection      x2  . Nevus excision      left arm + hand  . Colon surgery      drains placed to drain cyst  . Anterior cervical decomp/discectomy fusion N/A 02/12/2013    Procedure: ANTERIOR CERVICAL DECOMPRESSION/DISCECTOMY FUSION 1 LEVEL;  Surgeon: Winfield Cunas, MD;  Location: [place] [facility];  Service: Neurosurgery;  Laterality: N/A;  C67 anterior cervical decompression with fusion plating and bonegraft    Laurette Schimke MS, RD, LDN

## 2014-01-04 NOTE — Progress Notes (Signed)
[facility]                                                                              Patient Demographics  Benjamin Murillo, is a 43 y.o. male, DOB - 15-Mar-1970, AJO:[id number]  Admit date - 01/03/2014   Admitting Physician Shanda Howells, MD  Outpatient Primary MD for the patient is Murillo,[name], MD  LOS - 1   Chief Complaint  Patient presents with  . Abdominal Pain  . Nausea      Interim history; 43 year old male with history of Crohn's disease currently on Humira, tobacco abuse, presented to the emergency department with complaints of abdominal pain. Patient has had abdominal pain for the past several weeks to one month and finally came to emergency department as he was tired of dealing with his pain. Patient states he follows up with a gastroenterologist at [facility]. At this time, bowel pain does seem to be improving, will continue Solu-Medrol, antibiotics, antiemetics and pain medications as needed. Anticipate discharge within the next 24-48 hours.  Assessment & Plan   Abdominal pain secondary to Crohn's colitis -CT scan of the abdomen: Questionable wall thickening of the ascending colon versus artifact, active Crohn's not excluded. -Patient feels his abdominal pain has improved slightly -Continue IV Solu-Medrol, propoxyphene and Flagyl, when necessary antiemetics and pain medications -Patient is currently tolerating a heart healthy diet well  Tobacco abuse -Patient counseled concerning smoking cessation  Code Status: Full  Family Communication: Friend at bedside  Disposition Plan: Admitted  Time Spent in minutes   30 minutes  Procedures  None  Consults   None  DVT Prophylaxis  Heparin  Lab Results  Component Value Date   PLT 214 01/04/2014    Medications  Scheduled Meds: . ciprofloxacin  400 mg Intravenous Q12H  . feeding supplement (RESOURCE BREEZE)  1 Container Oral TID BM  . heparin  5,000 Units Subcutaneous 3 times per day  . methylPREDNISolone  (SOLU-MEDROL) injection  125 mg Intravenous Q12H  . metronidazole  500 mg Intravenous Q8H   Continuous Infusions: . sodium chloride 125 mL/hr at 01/03/14 2259   PRN Meds:.diphenhydrAMINE, morphine injection, ondansetron **OR** ondansetron (ZOFRAN) IV  Antibiotics    Anti-infectives    Start     Dose/Rate Route Frequency Ordered Stop   01/03/14 2215  metroNIDAZOLE (FLAGYL) IVPB 500 mg     500 mg100 mL/hr over 60 Minutes Intravenous Every 8 hours 01/03/14 2200     01/03/14 2200  ciprofloxacin (CIPRO) IVPB 400 mg     400 mg200 mL/hr over 60 Minutes Intravenous Every 12 hours 01/03/14 2200         Subjective:   Benjamin Murillo seen and examined today.  Patient feels his abdominal pain has improved slightly. He denies any nausea or vomiting at this time. Patient does state he took his Humira recently. He follows up with a gastroenterologist at [facility]. Patient denies any chest pain, shortness of breath, dizziness. He wishes to get up and walk around the hospital.  Objective:   Filed Vitals:   01/03/14 2030 01/03/14 2130 01/03/14 2251 01/04/14 0649  BP: 135/82 112/78 161/97 137/76  Pulse:   64 69  Temp:   97.8  F (36.6 C) 98.3 F (36.8 C)  TempSrc:   Oral Oral  Resp: 16 12 15 16   Height:      Weight:      SpO2: 99% 98% 94% 98%    Wt Readings from Last 3 Encounters:  01/03/14 53.978 kg (119 lb)  07/05/13 58.968 kg (130 lb)  06/21/13 58.968 kg (130 lb)     Intake/Output Summary (Last 24 hours) at 01/04/14 1219 Last data filed at 01/04/14 1002  Gross per 24 hour  Intake 1287.08 ml  Output    400 ml  Net 887.08 ml    Exam  General: Well developed, thin, NAD, appears stated age  HEENT: NCAT, PERRLA, EOMI, Anicteic Sclera, mucous membranes moist.   Cardiovascular: S1 S2 auscultated, no rubs, murmurs or gallops. Regular rate and rhythm.  Respiratory: Clear to auscultation bilaterally with equal chest rise  Abdomen: Soft, mild diffuse tenderness, nondistended, + bowel  sounds  Extremities: warm dry without cyanosis clubbing or edema  Neuro: AAOx3, cranial nerves grossly intact. Strength 5/5 in patient's upper and lower extremities bilaterally  Skin: Without rashes exudates or nodules  Psych: Appropriate mood and affect  Data Review   Micro Results No results found for this or any previous visit (from the past 240 hour(s)).  Radiology Reports Ct Abdomen Pelvis W Contrast  01/03/2014   CLINICAL DATA:  Abdominal pain all over, nausea, vomiting, diarrhea, partial history of Crohn's disease and 2 prior bowel resections, hypertension, GERD, smoker  EXAM: CT ABDOMEN AND PELVIS WITH CONTRAST  TECHNIQUE: Multidetector CT imaging of the abdomen and pelvis was performed using the standard protocol following bolus administration of intravenous contrast. Sagittal and coronal MPR images reconstructed from axial data set.  CONTRAST:  52m OMNIPAQUE IOHEXOL 300 MG/ML  SOLN  COMPARISON:  10/13/2012  FINDINGS: Minimal dependent atelectasis at lung bases.  Liver, spleen, pancreas, kidneys, and adrenal glands unremarkable.  Bladder and ureters normal appearance.  Stomach and small bowel loops unremarkable.  Suspect prior ileocolic bowel resection.  Questionable bowel wall thickening versus artifact from underdistention at the ascending colon.  Remaining colon unremarkable.  Scattered normal size mesenteric nodes.  No mass, adenopathy, free fluid, or free air.  Bones unremarkable.  IMPRESSION: Questionable wall thickening at the ascending colon versus artifact from underdistention, active Crohn's not excluded ; this site has a different appearance than that seen on the previous study.  Remainder of exam unremarkable.   Electronically Signed   By: MLavonia DanaM.D.   On: 01/03/2014 20:14    CBC  Recent Labs Lab 01/03/14 1218 01/03/14 2203 01/04/14 0615  WBC 8.1 6.8 5.0  HGB 12.8* 11.7* 12.6*  HCT 36.1* 34.4* 35.9*  PLT 250 207 214  MCV 95.5 94.2 95.2  MCH 33.9 32.1 33.4    MCHC 35.5 34.0 35.1  RDW 12.9 13.0 12.8  LYMPHSABS 1.7  --  0.7  MONOABS 0.4  --  0.1  EOSABS 0.1  --  0.0  BASOSABS 0.0  --  0.0    Chemistries   Recent Labs Lab 01/03/14 1218 01/03/14 2203 01/04/14 0615  NA 136*  --  135*  K 3.9  --  4.6  CL 100  --  101  CO2 22  --  22  GLUCOSE 78  --  112*  BUN 12  --  7  CREATININE 0.99 0.90 0.86  CALCIUM 9.2  --  8.7  AST 20  --  16  ALT 12  --  9  ALKPHOS 102  --  81  BILITOT 0.9  --  1.2   ------------------------------------------------------------------------------------------------------------------ estimated creatinine clearance is 86.3 mL/min (by C-G formula based on Cr of 0.86). ------------------------------------------------------------------------------------------------------------------ No results for input(s): HGBA1C in the last 72 hours. ------------------------------------------------------------------------------------------------------------------ No results for input(s): CHOL, HDL, LDLCALC, TRIG, CHOLHDL, LDLDIRECT in the last 72 hours. ------------------------------------------------------------------------------------------------------------------ No results for input(s): TSH, T4TOTAL, T3FREE, THYROIDAB in the last 72 hours.  Invalid input(s): FREET3 ------------------------------------------------------------------------------------------------------------------ No results for input(s): VITAMINB12, FOLATE, FERRITIN, TIBC, IRON, RETICCTPCT in the last 72 hours.  Coagulation profile No results for input(s): INR, PROTIME in the last 168 hours.  No results for input(s): DDIMER in the last 72 hours.  Cardiac Enzymes No results for input(s): CKMB, TROPONINI, MYOGLOBIN in the last 168 hours.  Invalid input(s): CK ------------------------------------------------------------------------------------------------------------------ Invalid input(s): POCBNP    Ossie Yebra D.O. on 01/04/2014 at 12:19  PM  Between 7am to 7pm - Pager - [phone]  After 7pm go to [email or web address] - password [email or web address]  And look for the night coverage person covering for me after hours  [facility] Office  [phone]

## 2014-01-04 NOTE — Progress Notes (Signed)
UR completed 

## 2014-01-04 NOTE — Plan of Care (Signed)
Problem: Phase I Progression Outcomes Goal: Voiding-avoid urinary catheter unless indicated Outcome: Completed/Met Date Met:  01/04/14     

## 2014-01-05 LAB — RAPID URINE DRUG SCREEN, HOSP PERFORMED
Amphetamines: NOT DETECTED
Barbiturates: NOT DETECTED
Benzodiazepines: NOT DETECTED
COCAINE: NOT DETECTED
OPIATES: NOT DETECTED
TETRAHYDROCANNABINOL: POSITIVE — AB

## 2014-01-05 MED ORDER — POLYETHYLENE GLYCOL 3350 17 G PO PACK
17.0000 g | PACK | Freq: Every day | ORAL | Status: DC
  Administered 2014-01-05: 17 g via ORAL
  Filled 2014-01-05 (×2): qty 1

## 2014-01-05 MED ORDER — CLINDAMYCIN HCL 300 MG PO CAPS
300.0000 mg | ORAL_CAPSULE | Freq: Three times a day (TID) | ORAL | Status: DC
  Administered 2014-01-05 – 2014-01-06 (×3): 300 mg via ORAL
  Filled 2014-01-05 (×6): qty 1

## 2014-01-05 MED ORDER — HYDROCODONE-ACETAMINOPHEN 5-325 MG PO TABS
1.0000 | ORAL_TABLET | ORAL | Status: DC | PRN
  Administered 2014-01-05 – 2014-01-06 (×3): 1 via ORAL
  Filled 2014-01-05 (×4): qty 1

## 2014-01-05 MED ORDER — BUDESONIDE 3 MG PO CP24
6.0000 mg | ORAL_CAPSULE | Freq: Every day | ORAL | Status: DC
  Administered 2014-01-05 – 2014-01-06 (×2): 6 mg via ORAL
  Filled 2014-01-05 (×2): qty 2

## 2014-01-05 NOTE — Plan of Care (Signed)
Problem: Phase I Progression Outcomes Goal: Pain controlled with appropriate interventions Outcome: Completed/Met Date Met:  01/05/14 Goal: OOB as tolerated unless otherwise ordered Outcome: Completed/Met Date Met:  01/05/14     

## 2014-01-05 NOTE — Progress Notes (Signed)
Dear Doctor: This patient has been identified as a candidate for PICC for the following reason (s): drug pH or osmolality (causing phlebitis, infiltration in 24 hours) and poor veins/poor circulatory system (CHF, COPD, emphysema, diabetes, steroid use, IV drug abuse, etc.) If you agree, please write an order for the indicated device. For any questions contact the Vascular Access Team at [phone] if no answer, please leave a message.  Thank you for supporting the early vascular access assessment program. 

## 2014-01-05 NOTE — Progress Notes (Signed)
Benjamin Murillo TIR:[id number] DOB: Sep 21, 1970 DOA: 01/03/2014 PCP: Cletis Athens, MD  Brief narrative: 43 y/o ? 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 Milta Deiters Shah/Dr. Valere Dross, [EGD/COl no eveidence inflammation, GERD, TOb abuse admitted [facility] 01/04/14 with Crohn's flare  Past medical history-As per Problem list Chart reviewed as below-   Consultants:  none  Procedures:  none  Antibiotics:  Cipro 12/7  Flagyll12/7   Subjective  Feels good. No diarrhea No cp Tol some diet now 10 Lb weight loss since thanskgiving No n V   Objective    Interim History:   Telemetry:    Objective: Filed Vitals:   01/04/14 0649 01/04/14 1417 01/04/14 2216 01/05/14 0627  BP: 137/76 135/77 172/74 116/68  Pulse: 69 53 68 57  Temp: 98.3 F (36.8 C) 97.9 F (36.6 C) 98.6 F (37 C) 98.2 F (36.8 C)  TempSrc: Oral Oral Oral Oral  Resp: 16 17 16 16   Height:      Weight:      SpO2: 98% 100% 96% 99%    Intake/Output Summary (Last 24 hours) at 01/05/14 0950 Last data filed at 01/05/14 0853  Gross per 24 hour  Intake 3608.17 ml  Output   1850 ml  Net 1758.17 ml    Exam:  General: eomi, NCAT, Body mass index is 16.6 kg/(m^2). Cardiovascular: s1 s 2 no m/r/g Respiratory: clear no added sound Abdomen: soft, NT/Nd no rebound Skin no LE edema Neuro intact  Data Reviewed: Basic Metabolic Panel:  Recent Labs Lab 01/03/14 1218 01/03/14 2203 01/04/14 0615  NA 136*  --  135*  K 3.9  --  4.6  CL 100  --  101  CO2 22  --  22  GLUCOSE 78  --  112*  BUN 12  --  7  CREATININE 0.99 0.90 0.86  CALCIUM 9.2  --  8.7   Liver Function Tests:  Recent Labs Lab 01/03/14 1218 01/04/14 0615  AST 20 16  ALT 12 9  ALKPHOS 102 81  BILITOT 0.9 1.2  PROT 7.3 6.6  ALBUMIN 4.0 3.6    Recent Labs Lab 01/03/14 1218  LIPASE 21   No results for input(s): AMMONIA in the last  168 hours. CBC:  Recent Labs Lab 01/03/14 1218 01/03/14 2203 01/04/14 0615  WBC 8.1 6.8 5.0  NEUTROABS 5.8  --  4.2  HGB 12.8* 11.7* 12.6*  HCT 36.1* 34.4* 35.9*  MCV 95.5 94.2 95.2  PLT 250 207 214   Cardiac Enzymes: No results for input(s): CKTOTAL, CKMB, CKMBINDEX, TROPONINI in the last 168 hours. BNP: Invalid input(s): POCBNP CBG: No results for input(s): GLUCAP in the last 168 hours.  No results found for this or any previous visit (from the past 240 hour(s)).   Studies:              All Imaging reviewed and is as per above notation   Scheduled Meds: . ciprofloxacin  400 mg Intravenous Q12H  . feeding supplement (RESOURCE BREEZE)  1 Container Oral TID BM  . heparin  5,000 Units Subcutaneous 3 times per day  . methylPREDNISolone (SOLU-MEDROL) injection  125 mg Intravenous Q12H  . metronidazole  500 mg Intravenous Q8H   Continuous Infusions: . sodium chloride 125 mL/hr at 01/05/14 0309     Assessment/Plan: 1. Colitis-Crohn's vs infectious to PO Clindamycin-Patient states he started feeling better after initiation IV therapies so not entirely sure what helped  most.  Lactoferrin and Cdiff/stool cult are pending   IV Solu-medrol-->enterocort 6 mg daily with slow taper [not taking this but supposed to be on it per [facility] notes]  ESR 2 but no further diease so not clear what is etiology.  Saline lock.  Morphine   vicodin. 2. Tob use-counslleed re cessation Body mass index is 16.6 kg/(m^2). severe malnutrition.  continue supplementation as per RDI  Code Status: full Family Communication:  None present bedside Disposition Plan: inpatent   Verneita Griffes, MD  [facility] Pager [phone] 01/05/2014, 9:50 AM    LOS: 2 days

## 2014-01-06 DIAGNOSIS — K50918 Crohn's disease, unspecified, with other complication: Secondary | ICD-10-CM

## 2014-01-06 LAB — COMPREHENSIVE METABOLIC PANEL
ALBUMIN: 3.6 g/dL (ref 3.5–5.2)
ALK PHOS: 65 U/L (ref 39–117)
ALT: 9 U/L (ref 0–53)
ANION GAP: 12 (ref 5–15)
AST: 16 U/L (ref 0–37)
BILIRUBIN TOTAL: 0.7 mg/dL (ref 0.3–1.2)
BUN: 11 mg/dL (ref 6–23)
CHLORIDE: 96 meq/L (ref 96–112)
CO2: 24 mEq/L (ref 19–32)
CREATININE: 0.83 mg/dL (ref 0.50–1.35)
Calcium: 8.9 mg/dL (ref 8.4–10.5)
GFR calc Af Amer: >90 mL/min (ref >90)
Glucose, Bld: 78 mg/dL (ref 70–99)
POTASSIUM: 3.7 meq/L (ref 3.7–5.3)
Sodium: 132 mEq/L — ABNORMAL LOW (ref 137–147)
Total Protein: 6.5 g/dL (ref 6.0–8.3)

## 2014-01-06 LAB — CBC WITH DIFFERENTIAL/PLATELET
BASOS ABS: 0 10*3/uL (ref 0.0–0.1)
BASOS PCT: 0 % (ref 0–1)
EOS PCT: 0 % (ref 0–5)
Eosinophils Absolute: 0 10*3/uL (ref 0.0–0.7)
HEMATOCRIT: 34.5 % — AB (ref 39.0–52.0)
Hemoglobin: 12.2 g/dL — ABNORMAL LOW (ref 13.0–17.0)
Lymphocytes Relative: 35 % (ref 12–46)
Lymphs Abs: 3 10*3/uL (ref 0.7–4.0)
MCH: 32.7 pg (ref 26.0–34.0)
MCHC: 35.4 g/dL (ref 30.0–36.0)
MCV: 92.5 fL (ref 78.0–100.0)
Monocytes Absolute: 0.8 10*3/uL (ref 0.1–1.0)
Monocytes Relative: 10 % (ref 3–12)
Neutro Abs: 4.8 10*3/uL (ref 1.7–7.7)
Neutrophils Relative %: 55 % (ref 43–77)
PLATELETS: 216 10*3/uL (ref 150–400)
RBC: 3.73 MIL/uL — ABNORMAL LOW (ref 4.22–5.81)
RDW: 12.5 % (ref 11.5–15.5)
WBC: 8.7 10*3/uL (ref 4.0–10.5)

## 2014-01-06 MED ORDER — BUDESONIDE 3 MG PO CP24: 6.0000 mg | ORAL_CAPSULE | Freq: Every day | ORAL | Status: DC

## 2014-01-06 MED ORDER — ACETAMINOPHEN-CODEINE #2 300-15 MG PO TABS: 1.0000 | ORAL_TABLET | ORAL | Status: DC | PRN

## 2014-01-06 NOTE — Progress Notes (Signed)
D/c to home. instrutions and rx given and demonstrated understanding.  VSS. Pain denied

## 2014-01-06 NOTE — Discharge Summary (Signed)
Physician Discharge Summary  Benjamin Murillo DVV:[id number] DOB: Feb 23, 1970 DOA: 01/03/2014  PCP: Cletis Athens, MD  Admit date: 01/03/2014 Discharge date: 01/06/2014  Time spent: 35 minutes  Recommendations for Outpatient Follow-up:  1. Recommend compliance with medical regimen-prescription given for 10 day supply Entocort 6 mg to be tapered her primary gastroenterologist at [facility] Dr. Manuella Ghazi. 2. Recommend transfer of care to [facility] if felt to program. Given difficulty with transport [facility] 3. Recommend smoking cessation  4. Consider CBC as well as see met in about a month as patient on Humira   Discharge Diagnoses:  Active Problems:   Crohn's colitis   AP (abdominal pain)   Crohn disease   Discharge Condition: Good  Diet recommendation: Regular  Filed Weights   01/03/14 1216  Weight: 53.978 kg (119 lb)    History of present illness:  43 y/o ? known Stricturing crohns  1990 c/out perianal invovment s/p Ileocecal resection 2009, Ileocecal resction 1998 on Humira/Enterocort [Dr. Milta Deiters Shah/Dr. Valere Dross, [EGD/COl no eveidence inflammation, GERD, TOb abuse admitted [facility] 01/04/14 with Crohn's flare  Hospital Course:   1. Colitis-Crohn's flare-Patient states he started feeling better after initiation IV therapies so not entirely sure what helped most. Lactoferrin and Cdiff/stool cult are pending--patient did not have a stool since admission.  IV Solu-medrol-->enterocort 6 mg daily with slow taper [not taking this but supposed to be on it per [facility] notes] ESR 2 but no further diease so not clear what is etiology.Patient was saline locked I discontinued his antibiotics on 01/06/14 as it was felt his doesn't patient was completely secondary to Crohn's disease. He should follow up closely with [facility] health care physician for further management 2. Tob use-counslleed re cessation 3. Body mass index is 16.6 kg/(m^2). severe malnutrition. continue supplementation as per  RDI    Discharge Exam: Filed Vitals:   01/06/14 0600  BP: 140/94  Pulse: 72  Temp: 98.6 F (37 C)  Resp: 17   Alert pleasant oriented Tolerated diet last night and multiple wrappers and packets of snacks at bedside  General: Alert pleasant Cardiovascular: S1-S2 no murmur rub or gallop Respiratory: Clinically clear Abdomen-soft nontender nondistended no rebound or guarding  Discharge Instructions You were cared for by a hospitalist during your hospital stay. If you have any questions about your discharge medications or the care you received while you were in the hospital after you are discharged, you can call the unit and asked to speak with the hospitalist on call if the hospitalist that took care of you is not available. Once you are discharged, your primary care physician will handle any further medical issues. Please note that NO REFILLS for any discharge medications will be authorized once you are discharged, as it is imperative that you return to your primary care physician (or establish a relationship with a primary care physician if you do not have one) for your aftercare needs so that they can reassess your need for medications and monitor your lab values.   Current Discharge Medication List    CONTINUE these medications which have NOT CHANGED   Details  HUMIRA PEN 40 MG/0.8ML injection Inject 40 mg as directed every 14 (fourteen) days.     erythromycin [facility]) ophthalmic ointment Place 1 application into the right eye 4 (four) times daily. Qty: 3.5 g, Refills: 0       Allergies  Allergen Reactions  . Hydrocodone-Acetaminophen Other (See Comments)    Diaphoresis  . Tramadol Other (See Comments)  Makes patient anxious and nervous.  . Morphine And Related Itching    High doses      The results of significant diagnostics from this hospitalization (including imaging, microbiology, ancillary and laboratory) are listed below for reference.    Significant  Diagnostic Studies: Ct Abdomen Pelvis W Contrast  01/03/2014   CLINICAL DATA:  Abdominal pain all over, nausea, vomiting, diarrhea, partial history of Crohn's disease and 2 prior bowel resections, hypertension, GERD, smoker  EXAM: CT ABDOMEN AND PELVIS WITH CONTRAST  TECHNIQUE: Multidetector CT imaging of the abdomen and pelvis was performed using the standard protocol following bolus administration of intravenous contrast. Sagittal and coronal MPR images reconstructed from axial data set.  CONTRAST:  74m OMNIPAQUE IOHEXOL 300 MG/ML  SOLN  COMPARISON:  10/13/2012  FINDINGS: Minimal dependent atelectasis at lung bases.  Liver, spleen, pancreas, kidneys, and adrenal glands unremarkable.  Bladder and ureters normal appearance.  Stomach and small bowel loops unremarkable.  Suspect prior ileocolic bowel resection.  Questionable bowel wall thickening versus artifact from underdistention at the ascending colon.  Remaining colon unremarkable.  Scattered normal size mesenteric nodes.  No mass, adenopathy, free fluid, or free air.  Bones unremarkable.  IMPRESSION: Questionable wall thickening at the ascending colon versus artifact from underdistention, active Crohn's not excluded ; this site has a different appearance than that seen on the previous study.  Remainder of exam unremarkable.   Electronically Signed   By: MLavonia DanaM.D.   On: 01/03/2014 20:14    Microbiology: No results found for this or any previous visit (from the past 240 hour(s)).   Labs: Basic Metabolic Panel:  Recent Labs Lab 01/03/14 1218 01/03/14 2203 01/04/14 0615 01/06/14 0540  NA 136*  --  135* 132*  K 3.9  --  4.6 3.7  CL 100  --  101 96  CO2 22  --  22 24  GLUCOSE 78  --  112* 78  BUN 12  --  7 11  CREATININE 0.99 0.90 0.86 0.83  CALCIUM 9.2  --  8.7 8.9   Liver Function Tests:  Recent Labs Lab 01/03/14 1218 01/04/14 0615 01/06/14 0540  AST 20 16 16   ALT 12 9 9   ALKPHOS 102 81 65  BILITOT 0.9 1.2 0.7  PROT 7.3  6.6 6.5  ALBUMIN 4.0 3.6 3.6    Recent Labs Lab 01/03/14 1218  LIPASE 21   No results for input(s): AMMONIA in the last 168 hours. CBC:  Recent Labs Lab 01/03/14 1218 01/03/14 2203 01/04/14 0615 01/06/14 0540  WBC 8.1 6.8 5.0 8.7  NEUTROABS 5.8  --  4.2 4.8  HGB 12.8* 11.7* 12.6* 12.2*  HCT 36.1* 34.4* 35.9* 34.5*  MCV 95.5 94.2 95.2 92.5  PLT 250 207 214 216   Cardiac Enzymes: No results for input(s): CKTOTAL, CKMB, CKMBINDEX, TROPONINI in the last 168 hours. BNP: BNP (last 3 results) No results for input(s): PROBNP in the last 8760 hours. CBG: No results for input(s): GLUCAP in the last 168 hours.     Signed:Nita Sells [facility] 01/06/2014, 8:15 AM

## 2014-02-28 ENCOUNTER — Encounter (HOSPITAL_COMMUNITY): Payer: Self-pay | Admitting: *Deleted

## 2014-02-28 ENCOUNTER — Emergency Department (HOSPITAL_COMMUNITY)
Admission: EM | Admit: 2014-02-28 | Discharge: 2014-02-28 | Disposition: A | Discharge status: Home / Self Care | Payer: Medicare Other | Attending: Emergency Medicine | Admitting: Emergency Medicine

## 2014-02-28 DIAGNOSIS — Z720 Tobacco use: Secondary | ICD-10-CM | POA: Insufficient documentation

## 2014-02-28 DIAGNOSIS — R6883 Chills (without fever): Secondary | ICD-10-CM | POA: Insufficient documentation

## 2014-02-28 DIAGNOSIS — D649 Anemia, unspecified: Secondary | ICD-10-CM | POA: Diagnosis not present

## 2014-02-28 DIAGNOSIS — H53149 Visual discomfort, unspecified: Secondary | ICD-10-CM | POA: Diagnosis not present

## 2014-02-28 DIAGNOSIS — Z8739 Personal history of other diseases of the musculoskeletal system and connective tissue: Secondary | ICD-10-CM | POA: Insufficient documentation

## 2014-02-28 DIAGNOSIS — R519 Headache, unspecified: Secondary | ICD-10-CM

## 2014-02-28 DIAGNOSIS — K5090 Crohn's disease, unspecified, without complications: Secondary | ICD-10-CM

## 2014-02-28 DIAGNOSIS — Z7952 Long term (current) use of systemic steroids: Secondary | ICD-10-CM | POA: Diagnosis not present

## 2014-02-28 DIAGNOSIS — Z79899 Other long term (current) drug therapy: Secondary | ICD-10-CM | POA: Diagnosis not present

## 2014-02-28 DIAGNOSIS — I10 Essential (primary) hypertension: Secondary | ICD-10-CM | POA: Insufficient documentation

## 2014-02-28 DIAGNOSIS — R109 Unspecified abdominal pain: Secondary | ICD-10-CM

## 2014-02-28 DIAGNOSIS — G8929 Other chronic pain: Secondary | ICD-10-CM | POA: Diagnosis not present

## 2014-02-28 DIAGNOSIS — R51 Headache: Secondary | ICD-10-CM | POA: Insufficient documentation

## 2014-02-28 LAB — CBC WITH DIFFERENTIAL/PLATELET
BASOS ABS: 0 10*3/uL (ref 0.0–0.1)
BASOS PCT: 0 % (ref 0–1)
EOS PCT: 2 % (ref 0–5)
Eosinophils Absolute: 0.2 10*3/uL (ref 0.0–0.7)
HCT: 32.8 % — ABNORMAL LOW (ref 39.0–52.0)
HEMOGLOBIN: 11.3 g/dL — AB (ref 13.0–17.0)
LYMPHS ABS: 2.5 10*3/uL (ref 0.7–4.0)
Lymphocytes Relative: 40 % (ref 12–46)
MCH: 32.7 pg (ref 26.0–34.0)
MCHC: 34.5 g/dL (ref 30.0–36.0)
MCV: 94.8 fL (ref 78.0–100.0)
MONO ABS: 0.5 10*3/uL (ref 0.1–1.0)
MONOS PCT: 8 % (ref 3–12)
Neutro Abs: 3.2 10*3/uL (ref 1.7–7.7)
Neutrophils Relative %: 50 % (ref 43–77)
Platelets: 236 10*3/uL (ref 150–400)
RBC: 3.46 MIL/uL — AB (ref 4.22–5.81)
RDW: 13.5 % (ref 11.5–15.5)
WBC: 6.4 10*3/uL (ref 4.0–10.5)

## 2014-02-28 LAB — COMPREHENSIVE METABOLIC PANEL
ALK PHOS: 89 U/L (ref 39–117)
ALT: 12 U/L (ref 0–53)
ANION GAP: 5 (ref 5–15)
AST: 24 U/L (ref 0–37)
Albumin: 3.6 g/dL (ref 3.5–5.2)
BILIRUBIN TOTAL: 0.6 mg/dL (ref 0.3–1.2)
BUN: 5 mg/dL — ABNORMAL LOW (ref 6–23)
CO2: 28 mmol/L (ref 19–32)
CREATININE: 1.1 mg/dL (ref 0.50–1.35)
Calcium: 8.9 mg/dL (ref 8.4–10.5)
Chloride: 102 mmol/L (ref 96–112)
GFR calc Af Amer: >90 mL/min (ref >90)
GFR calc non Af Amer: 82 mL/min — ABNORMAL LOW (ref >90)
Glucose, Bld: 81 mg/dL (ref 70–99)
Potassium: 3.5 mmol/L (ref 3.5–5.1)
Sodium: 135 mmol/L (ref 135–145)
Total Protein: 6.6 g/dL (ref 6.0–8.3)

## 2014-02-28 LAB — SEDIMENTATION RATE: SED RATE: 7 mm/h (ref 0–16)

## 2014-02-28 LAB — LIPASE, BLOOD: Lipase: 41 U/L (ref 11–59)

## 2014-02-28 MED ORDER — SODIUM CHLORIDE 0.9 % IV SOLN
1000.0000 mL | INTRAVENOUS | Status: DC
  Administered 2014-02-28: 1000 mL via INTRAVENOUS

## 2014-02-28 MED ORDER — SODIUM CHLORIDE 0.9 % IV SOLN
1000.0000 mL | Freq: Once | INTRAVENOUS | Status: AC
  Administered 2014-02-28: 1000 mL via INTRAVENOUS

## 2014-02-28 MED ORDER — PREDNISONE 20 MG PO TABS: 40.0000 mg | ORAL_TABLET | Freq: Every day | ORAL | Status: DC

## 2014-02-28 MED ORDER — METOCLOPRAMIDE HCL 5 MG/ML IJ SOLN
10.0000 mg | Freq: Once | INTRAMUSCULAR | Status: AC
Start: 2014-02-28 — End: 2014-02-28
  Administered 2014-02-28: 10 mg via INTRAVENOUS
  Filled 2014-02-28: qty 2

## 2014-02-28 MED ORDER — METHYLPREDNISOLONE SODIUM SUCC 125 MG IJ SOLR
125.0000 mg | Freq: Once | INTRAMUSCULAR | Status: AC
  Administered 2014-02-28: 125 mg via INTRAVENOUS
  Filled 2014-02-28: qty 2

## 2014-02-28 MED ORDER — METOCLOPRAMIDE HCL 10 MG PO TABS: 10.0000 mg | ORAL_TABLET | Freq: Four times a day (QID) | ORAL | Status: DC | PRN

## 2014-02-28 MED ORDER — DIPHENHYDRAMINE HCL 50 MG/ML IJ SOLN
25.0000 mg | Freq: Once | INTRAMUSCULAR | Status: AC
  Administered 2014-02-28: 25 mg via INTRAVENOUS
  Filled 2014-02-28: qty 1

## 2014-02-28 NOTE — Discharge Instructions (Signed)
General Headache Without Cause A headache is pain or discomfort felt around the head or neck area. The specific cause of a headache may not be found. There are many causes and types of headaches. A few common ones are:  Tension headaches.  Migraine headaches.  Cluster headaches.  Chronic daily headaches. HOME CARE INSTRUCTIONS   Keep all follow-up appointments with your caregiver or any specialist referral.  Only take over-the-counter or prescription medicines for pain or discomfort as directed by your caregiver.  Lie down in a dark, quiet room when you have a headache.  Keep a headache journal to find out what may trigger your migraine headaches. For example, write down:  What you eat and drink.  How much sleep you get.  Any change to your diet or medicines.  Try massage or other relaxation techniques.  Put ice packs or heat on the head and neck. Use these 3 to 4 times per day for 15 to 20 minutes each time, or as needed.  Limit stress.  Sit up straight, and do not tense your muscles.  Quit smoking if you smoke.  Limit alcohol use.  Decrease the amount of caffeine you drink, or stop drinking caffeine.  Eat and sleep on a regular schedule.  Get 7 to 9 hours of sleep, or as recommended by your caregiver.  Keep lights dim if bright lights bother you and make your headaches worse. SEEK MEDICAL CARE IF:   You have problems with the medicines you were prescribed.  Your medicines are not working.  You have a change from the usual headache.  You have nausea or vomiting. SEEK IMMEDIATE MEDICAL CARE IF:   Your headache becomes severe.  You have a fever.  You have a stiff neck.  You have loss of vision.  You have muscular weakness or loss of muscle control.  You start losing your balance or have trouble walking.  You feel faint or pass out.  You have severe symptoms that are different from your first symptoms. MAKE SURE YOU:   Understand these  instructions.  Will watch your condition.  Will get help right away if you are not doing well or get worse. Document Released: 01/14/2005 Document Revised: 04/08/2011 Document Reviewed: 01/30/2011 [facility] Patient Information 2015 [place], [place]. This information is not intended to replace advice given to you by your health care provider. Make sure you discuss any questions you have with your health care provider.  Crohn Disease Crohn disease is a long-term (chronic) soreness and redness (inflammation) of the intestines (bowel). It can affect any portion of the digestive tract, from the mouth to the anus. It can also cause problems outside the digestive tract. Crohn disease is closely related to a disease called ulcerative colitis (together, these two diseases are called inflammatory bowel disease).  CAUSES  The cause of Crohn disease is not known. One [employer] Snuffer is that, in an easily affected person, the immune system is triggered to attack the body's own digestive tissue. Crohn disease runs in families. It seems to be more common in certain geographic areas and amongst certain races. There are no clear-cut dietary causes.  SYMPTOMS  Crohn disease can cause many different symptoms since it can affect many different parts of the body. Symptoms include:  Fatigue.  Weight loss.  Chronic diarrhea, sometime bloody.  Abdominal pain and cramps.  Fever.  Ulcers or canker sores in the mouth or rectum.  Anemia (low red blood cells).  Arthritis, skin problems, and eye problems may  occur. Complications of Crohn disease can include:  Series of holes (perforation) of the bowel.  Portions of the intestines sticking to each other (adhesions).  Obstruction of the bowel.  Fistula formation, typically in the rectal area but also in other areas. A fistula is an opening between the bowels and the outside, or between the bowels and another organ.  A painful crack in the mucous membrane of the anus  (rectal fissure). DIAGNOSIS  Your caregiver may suspect Crohn disease based on your symptoms and an exam. Blood tests may confirm that there is a problem. You may be asked to submit a stool specimen for examination. X-rays and CT scans may be necessary. Ultimately, the diagnosis is usually made after a procedure that uses a flexible tube that is inserted via your mouth or your anus. This is done under sedation and is called either an upper endoscopy or colonoscopy. With these tests, the specialist can take tiny tissue samples and remove them from the inside of the bowel (biopsy). Examination of this biopsy tissue under a microscope can reveal Crohn disease as the cause of your symptoms. Due to the many different forms that Crohn disease can take, symptoms may be present for several years before a diagnosis is made. TREATMENT  Medications are often used to decrease inflammation and control the immune system. These include medicines related to aspirin, steroid medications, and newer and stronger medications to slow down the immune system. Some medications may be used as suppositories or enemas. A number of other medications are used or have been studied. Your caregiver will make specific recommendations. HOME CARE INSTRUCTIONS   Symptoms such as diarrhea can be controlled with medications. Avoid foods that have a laxative effect such as fresh fruit, vegetables, and dairy products. During flare-ups, you can rest your bowel by refraining from solid foods. Drink clear liquids frequently during the day. (Electrolyte or rehydrating fluids are best. Your caregiver can help you with suggestions.) Drink often to prevent loss of body fluids (dehydration). When diarrhea has cleared, eat small meals and more frequently. Avoid food additives and stimulants such as caffeine (coffee, tea, or chocolate). Enzyme supplements may help if you develop intolerance to a sugar in dairy products (lactose). Ask your caregiver or  dietitian about specific dietary instructions.  Try to maintain a positive attitude. Learn relaxation techniques such as self-hypnosis, mental imaging, and muscle relaxation.  If possible, avoid stresses which can aggravate your condition.  Exercise regularly.  Follow your diet.  Always get plenty of rest. SEEK MEDICAL CARE IF:   Your symptoms fail to improve after a week or two of new treatment.  You experience continued weight loss.  You have ongoing cramps or loose bowels.  You develop a new skin rash, skin sores, or eye problems. SEEK IMMEDIATE MEDICAL CARE IF:   You have worsening of your symptoms or develop new symptoms.  You have a fever.  You develop bloody diarrhea.  You develop severe abdominal pain. MAKE SURE YOU:   Understand these instructions.  Will watch your condition.  Will get help right away if you are not doing well or get worse. Document Released: 10/24/2004 Document Revised: 05/31/2013 Document Reviewed: 09/22/2006 [facility] Patient Information 2015 [place], [place]. This information is not intended to replace advice given to you by your health care provider. Make sure you discuss any questions you have with your health care provider.  Prednisone tablets What is this medicine? PREDNISONE (PRED ni sone) is a corticosteroid. It is commonly used  to treat inflammation of the skin, joints, lungs, and other organs. Common conditions treated include asthma, allergies, and arthritis. It is also used for other conditions, such as blood disorders and diseases of the adrenal glands. This medicine may be used for other purposes; ask your health care provider or pharmacist if you have questions. COMMON BRAND NAME(S): Deltasone, Predone, Sterapred, Sterapred DS What should I tell my health care provider before I take this medicine? They need to know if you have any of these conditions: -Cushing's syndrome -diabetes -glaucoma -heart disease -high blood  pressure -infection (especially a virus infection such as chickenpox, cold sores, or herpes) -kidney disease -liver disease -mental illness -myasthenia gravis -osteoporosis -seizures -stomach or intestine problems -thyroid disease -an unusual or allergic reaction to lactose, prednisone, other medicines, foods, dyes, or preservatives -pregnant or trying to get pregnant -breast-feeding How should I use this medicine? Take this medicine by mouth with a glass of water. Follow the directions on the prescription label. Take this medicine with food. If you are taking this medicine once a day, take it in the morning. Do not take more medicine than you are told to take. Do not suddenly stop taking your medicine because you may develop a severe reaction. Your doctor will tell you how much medicine to take. If your doctor wants you to stop the medicine, the dose may be slowly lowered over time to avoid any side effects. Talk to your pediatrician regarding the use of this medicine in children. Special care may be needed. Overdosage: If you think you have taken too much of this medicine contact a poison control center or emergency room at once. NOTE: This medicine is only for you. Do not share this medicine with others. What if I miss a dose? If you miss a dose, take it as soon as you can. If it is almost time for your next dose, talk to your doctor or health care professional. You may need to miss a dose or take an extra dose. Do not take double or extra doses without advice. What may interact with this medicine? Do not take this medicine with any of the following medications: -metyrapone -mifepristone This medicine may also interact with the following medications: -aminoglutethimide -amphotericin B -aspirin and aspirin-like medicines -barbiturates -certain medicines for diabetes, like glipizide or glyburide -cholestyramine -cholinesterase  inhibitors -cyclosporine -digoxin -diuretics -ephedrine -male hormones, like estrogens and birth control pills -isoniazid -ketoconazole -NSAIDS, medicines for pain and inflammation, like ibuprofen or naproxen -phenytoin -rifampin -toxoids -vaccines -warfarin This list may not describe all possible interactions. Give your health care provider a list of all the medicines, herbs, non-prescription drugs, or dietary supplements you use. Also tell them if you smoke, drink alcohol, or use illegal drugs. Some items may interact with your medicine. What should I watch for while using this medicine? Visit your doctor or health care professional for regular checks on your progress. If you are taking this medicine over a prolonged period, carry an identification card with your name and address, the type and dose of your medicine, and your doctor's name and address. This medicine may increase your risk of getting an infection. Tell your doctor or health care professional if you are around anyone with measles or chickenpox, or if you develop sores or blisters that do not heal properly. If you are going to have surgery, tell your doctor or health care professional that you have taken this medicine within the last twelve months. Ask your doctor or health care  professional about your diet. You may need to lower the amount of salt you eat. This medicine may affect blood sugar levels. If you have diabetes, check with your doctor or health care professional before you change your diet or the dose of your diabetic medicine. What side effects may I notice from receiving this medicine? Side effects that you should report to your doctor or health care professional as soon as possible: -allergic reactions like skin rash, itching or hives, swelling of the face, lips, or tongue -changes in emotions or moods -changes in vision -depressed mood -eye pain -fever or chills, cough, sore throat, pain or difficulty  passing urine -increased thirst -swelling of ankles, feet Side effects that usually do not require medical attention (report to your doctor or health care professional if they continue or are bothersome): -confusion, excitement, restlessness -headache -nausea, vomiting -skin problems, acne, thin and shiny skin -trouble sleeping -weight gain This list may not describe all possible side effects. Call your doctor for medical advice about side effects. You may report side effects to FDA at 1-800-FDA-1088. Where should I keep my medicine? Keep out of the reach of children. Store at room temperature between 15 and 30 degrees C (59 and 86 degrees F). Protect from light. Keep container tightly closed. Throw away any unused medicine after the expiration date. NOTE: This sheet is a summary. It may not cover all possible information. If you have questions about this medicine, talk to your doctor, pharmacist, or health care provider.  2015, [employer] Standard. (2010-08-30 10:57:14)  Metoclopramide tablets What is this medicine? METOCLOPRAMIDE (met oh kloe PRA mide) is used to treat the symptoms of gastroesophageal reflux disease (GERD) like heartburn. It is also used to treat people with slow emptying of the stomach and intestinal tract. This medicine may be used for other purposes; ask your health care provider or pharmacist if you have questions. COMMON BRAND NAME(S): Reglan What should I tell my health care provider before I take this medicine? They need to know if you have any of these conditions: -breast cancer -depression -diabetes -heart failure -high blood pressure -kidney disease -liver disease -Parkinson's disease or a movement disorder -pheochromocytoma -seizures -stomach obstruction, bleeding, or perforation -an unusual or allergic reaction to metoclopramide, procainamide, sulfites, other medicines, foods, dyes, or preservatives -pregnant or trying to get  pregnant -breast-feeding How should I use this medicine? Take this medicine by mouth with a glass of water. Follow the directions on the prescription label. Take this medicine on an empty stomach, about 30 minutes before eating. Take your doses at regular intervals. Do not take your medicine more often than directed. Do not stop taking except on the advice of your doctor or health care professional. A special MedGuide will be given to you by the pharmacist with each prescription and refill. Be sure to read this information carefully each time. Talk to your pediatrician regarding the use of this medicine in children. Special care may be needed. Overdosage: If you think you have taken too much of this medicine contact a poison control center or emergency room at once. NOTE: This medicine is only for you. Do not share this medicine with others. What if I miss a dose? If you miss a dose, take it as soon as you can. If it is almost time for your next dose, take only that dose. Do not take double or extra doses. What may interact with this medicine? -acetaminophen -cyclosporine -digoxin -medicines for blood pressure -medicines for diabetes, including  insulin -medicines for hay fever and other allergies -medicines for depression, especially an Monoamine Oxidase Inhibitor (MAOI) -medicines for Parkinson's disease, like levodopa -medicines for sleep or for pain -tetracycline This list may not describe all possible interactions. Give your health care provider a list of all the medicines, herbs, non-prescription drugs, or dietary supplements you use. Also tell them if you smoke, drink alcohol, or use illegal drugs. Some items may interact with your medicine. What should I watch for while using this medicine? It may take a few weeks for your stomach condition to start to get better. However, do not take this medicine for longer than 12 weeks. The longer you take this medicine, and the more you take it,  the greater your chances are of developing serious side effects. If you are an elderly patient, a male patient, or you have diabetes, you may be at an increased risk for side effects from this medicine. Contact your doctor immediately if you start having movements you cannot control such as lip smacking, rapid movements of the tongue, involuntary or uncontrollable movements of the eyes, head, arms and legs, or muscle twitches and spasms. Patients and their families should watch out for worsening depression or thoughts of suicide. Also watch out for any sudden or severe changes in feelings such as feeling anxious, agitated, panicky, irritable, hostile, aggressive, impulsive, severely restless, overly excited and hyperactive, or not being able to sleep. If this happens, especially at the beginning of treatment or after a change in dose, call your doctor. Do not treat yourself for high fever. Ask your doctor or health care professional for advice. You may get drowsy or dizzy. Do not drive, use machinery, or do anything that needs mental alertness until you know how this drug affects you. Do not stand or sit up quickly, especially if you are an older patient. This reduces the risk of dizzy or fainting spells. Alcohol can make you more drowsy and dizzy. Avoid alcoholic drinks. What side effects may I notice from receiving this medicine? Side effects that you should report to your doctor or health care professional as soon as possible: -allergic reactions like skin rash, itching or hives, swelling of the face, lips, or tongue -abnormal production of milk in females -breast enlargement in both males and females -change in the way you walk -difficulty moving, speaking or swallowing -drooling, lip smacking, or rapid movements of the tongue -excessive sweating -fever -involuntary or uncontrollable movements of the eyes, head, arms and legs -irregular heartbeat or palpitations -muscle twitches and  spasms -unusually weak or tired Side effects that usually do not require medical attention (report to your doctor or health care professional if they continue or are bothersome): -change in sex drive or performance -depressed mood -diarrhea -difficulty sleeping -headache -menstrual changes -restless or nervous This list may not describe all possible side effects. Call your doctor for medical advice about side effects. You may report side effects to FDA at 1-800-FDA-1088. Where should I keep my medicine? Keep out of the reach of children. Store at room temperature between 20 and 25 degrees C (68 and 77 degrees F). Protect from light. Keep container tightly closed. Throw away any unused medicine after the expiration date. NOTE: This sheet is a summary. It may not cover all possible information. If you have questions about this medicine, talk to your doctor, pharmacist, or health care provider.  2015, [employer] Standard. (2011-05-14 13:04:38)

## 2014-02-28 NOTE — ED Notes (Signed)
Unable to obtain signature due to key pad not working.

## 2014-02-28 NOTE — ED Notes (Signed)
Patient c/o headache and pressure being up.  Also c/o abd pain since his chrones is acting up

## 2014-02-28 NOTE — ED Provider Notes (Signed)
CSN: [id number]     Arrival date & time 02/28/14  0010 History  This chart was scribed for Benjamin Fuel, MD by Hilda Lias, ED Scribe. This patient was seen in room A12C/A12C and the patient's care was started at 12:49 AM.    Chief Complaint  Patient presents with  . Hypertension  . Headache   The history is provided by the patient. No language interpreter was used.     HPI Comments: Benjamin Murillo is a 44 y.o. male with Crohn's disease who presents to the Emergency Department complaining of constant sharp peri-umbilical abdominal pain that he rates 10/10 in severity with associated diarrhea, vomiting, and chills that has been present for the past three days. Pt states that he has had diarrhea 5-6x today and states that anything he eats or drinks, "goes straight through me". Pt denies blood in his stools. Pt also complains of a constant, sharp headache that he rates as 10/10 in severity with associated photophobia and high blood pressure that has been present for the past three days. Pt states that any sort of light makes it worse, and resting and keeping his eyes closed makes it better.   Past Medical History  Diagnosis Date  . Crohn disease   . Chronic neck pain   . Chronic abdominal pain   . Cervical radiculopathy   . Shortness of breath     "sometimes laying down; sometimes w/activity" (10/13/2012)  . Hypertension     no meds   . Cold     2 weeks ago with cough  still   . GERD (gastroesophageal reflux disease)   . EQASTMHD(622.2)    Past Surgical History  Procedure Laterality Date  . Bowel resection      x2  . Nevus excision      left arm + hand  . Colon surgery      drains placed to drain cyst  . Anterior cervical decomp/discectomy fusion N/A 02/12/2013    Procedure: ANTERIOR CERVICAL DECOMPRESSION/DISCECTOMY FUSION 1 LEVEL;  Surgeon: Winfield Cunas, MD;  Location: [facility];  Service: Neurosurgery;  Laterality: N/A;  C67 anterior cervical decompression with fusion  plating and bonegraft   History reviewed. No pertinent family history. History  Substance Use Topics  . Smoking status: Current Every Day Smoker -- 0.75 packs/day for 22 years    Types: Cigarettes  . Smokeless tobacco: Never Used  . Alcohol Use: No    Review of Systems  Constitutional: Positive for chills.  Eyes: Positive for photophobia.  Gastrointestinal: Positive for vomiting, abdominal pain and diarrhea. Negative for blood in stool.  Neurological: Positive for headaches.  All other systems reviewed and are negative.     Allergies  Hydrocodone-acetaminophen; Tramadol; and Morphine and related  Home Medications   Prior to Admission medications   Medication Sig Start Date End Date Taking? Authorizing Provider  acetaminophen-codeine (TYLENOL #2) 300-15 MG per tablet Take 1 tablet by mouth every 4 (four) hours as needed for moderate pain. 01/06/14   Nita Sells, MD  budesonide (ENTOCORT EC) 3 MG 24 hr capsule Take 2 capsules (6 mg total) by mouth daily. 01/06/14   Nita Sells, MD  erythromycin [facility]) ophthalmic ointment Place 1 application into the right eye 4 (four) times daily. Patient not taking: Reported on 01/03/2014 10/15/13   Courtney A Forcucci, PA-C  HUMIRA PEN 40 MG/0.8ML injection Inject 40 mg as directed every 14 (fourteen) days.  03/22/13   Historical Provider, MD   BP 157/93  mmHg  Pulse 63  Temp(Src) 98.2 F (36.8 C) (Oral)  Resp 18  Ht 5' 11"  (1.803 m)  Wt 118 lb (53.524 kg)  BMI 16.46 kg/m2  SpO2 100% Physical Exam  Constitutional: He is oriented to person, place, and time. He appears well-developed and well-nourished.  HENT:  Head: Normocephalic and atraumatic.  Eyes: EOM are normal. Pupils are equal, round, and reactive to light.  Fundi show no hemorrhage, exudate, or papilledema.  Neck: Normal range of motion. Neck supple. No JVD present.  Cardiovascular: Normal rate, regular rhythm and normal heart sounds.   No murmur  heard. Pulmonary/Chest: Effort normal and breath sounds normal. He has no wheezes. He has no rales. He exhibits no tenderness.  Abdominal: Soft. Bowel sounds are normal. He exhibits no distension and no mass. There is tenderness. There is no rebound and no guarding.  Mild periumbilical tenderness without rebound or guarding.  Musculoskeletal: Normal range of motion. He exhibits no edema.  Lymphadenopathy:    He has no cervical adenopathy.  Neurological: He is alert and oriented to person, place, and time. No cranial nerve deficit. Coordination normal.  Skin: Skin is warm and dry. No rash noted.  Psychiatric: He has a normal mood and affect. His behavior is normal. Judgment and thought content normal.  Nursing note and vitals reviewed.   ED Course  Procedures (including critical care time)  DIAGNOSTIC STUDIES: Oxygen Saturation is 100% on RA, normal by my interpretation.    COORDINATION OF CARE: 1:00 AM Discussed treatment plan with pt at bedside and pt agreed to plan.   Labs Review Results for orders placed or performed during the hospital encounter of 02/28/14  Comprehensive metabolic panel  Result Value Ref Range   Sodium 135 135 - 145 mmol/L   Potassium 3.5 3.5 - 5.1 mmol/L   Chloride 102 96 - 112 mmol/L   CO2 28 19 - 32 mmol/L   Glucose, Bld 81 70 - 99 mg/dL   BUN 5 (L) 6 - 23 mg/dL   Creatinine, Ser 1.10 0.50 - 1.35 mg/dL   Calcium 8.9 8.4 - 10.5 mg/dL   Total Protein 6.6 6.0 - 8.3 g/dL   Albumin 3.6 3.5 - 5.2 g/dL   AST 24 0 - 37 U/L   ALT 12 0 - 53 U/L   Alkaline Phosphatase 89 39 - 117 U/L   Total Bilirubin 0.6 0.3 - 1.2 mg/dL   GFR calc non Af Amer 82 (L) >90 mL/min   GFR calc Af Amer >90 >90 mL/min   Anion gap 5 5 - 15  Lipase, blood  Result Value Ref Range   Lipase 41 11 - 59 U/L  CBC with Differential  Result Value Ref Range   WBC 6.4 4.0 - 10.5 K/uL   RBC 3.46 (L) 4.22 - 5.81 MIL/uL   Hemoglobin 11.3 (L) 13.0 - 17.0 g/dL   HCT 32.8 (L) 39.0 - 52.0 %    MCV 94.8 78.0 - 100.0 fL   MCH 32.7 26.0 - 34.0 pg   MCHC 34.5 30.0 - 36.0 g/dL   RDW 13.5 11.5 - 15.5 %   Platelets 236 150 - 400 K/uL   Neutrophils Relative % 50 43 - 77 %   Neutro Abs 3.2 1.7 - 7.7 K/uL   Lymphocytes Relative 40 12 - 46 %   Lymphs Abs 2.5 0.7 - 4.0 K/uL   Monocytes Relative 8 3 - 12 %   Monocytes Absolute 0.5 0.1 - 1.0 K/uL  Eosinophils Relative 2 0 - 5 %   Eosinophils Absolute 0.2 0.0 - 0.7 K/uL   Basophils Relative 0 0 - 1 %   Basophils Absolute 0.0 0.0 - 0.1 K/uL  Sedimentation rate  Result Value Ref Range   Sed Rate 7 0 - 16 mm/hr    MDM   Final diagnoses:  Headache, unspecified headache type  Abdominal pain, unspecified abdominal location  Crohn disease, without complications  Normochromic normocytic anemia    Headache which has characteristics suggestive of migraine versus muscle contraction headache. Blood pressure is not at immediate concern. Abdominal pain which may represent a flareup of Crohn's disease. Relatively benign abdomen. Old records are reviewed and he was hospitalized one month ago for exacerbation of Crohn's disease. He has had 11 CT scans of his abdomen and pelvis in our institution. I do not see indication for abdominal imaging at this point. Screening labs are obtained and he his given treatment for his headache with IV fluids, metoclopramide, diphenhydramine. He is given dose of methylprednisolone which would be helpful both for his headache and his Crohn's disease.  Following above-noted treatment, he is sleeping soundly. Laboratory workup is unremarkable except for mild anemia. He is discharged with prescriptions for prednisone and metoclopramide and is to follow-up with his PCP in the upcoming week.  I personally performed the services described in this documentation, which was scribed in my presence. The recorded information has been reviewed and is accurate.     Benjamin Fuel, MD 88/64/84 [id number]

## 2014-03-08 ENCOUNTER — Emergency Department (HOSPITAL_COMMUNITY): Payer: Medicare Other

## 2014-03-08 ENCOUNTER — Encounter (HOSPITAL_COMMUNITY): Payer: Self-pay | Admitting: Emergency Medicine

## 2014-03-08 ENCOUNTER — Emergency Department (HOSPITAL_COMMUNITY)
Admission: EM | Admit: 2014-03-08 | Discharge: 2014-03-08 | Disposition: A | Discharge status: Home / Self Care | Payer: Medicare Other | Attending: Emergency Medicine | Admitting: Emergency Medicine

## 2014-03-08 DIAGNOSIS — Z7952 Long term (current) use of systemic steroids: Secondary | ICD-10-CM | POA: Diagnosis not present

## 2014-03-08 DIAGNOSIS — G8929 Other chronic pain: Secondary | ICD-10-CM | POA: Insufficient documentation

## 2014-03-08 DIAGNOSIS — R1084 Generalized abdominal pain: Secondary | ICD-10-CM | POA: Diagnosis not present

## 2014-03-08 DIAGNOSIS — Z8719 Personal history of other diseases of the digestive system: Secondary | ICD-10-CM | POA: Diagnosis not present

## 2014-03-08 DIAGNOSIS — R197 Diarrhea, unspecified: Secondary | ICD-10-CM | POA: Insufficient documentation

## 2014-03-08 DIAGNOSIS — Z79899 Other long term (current) drug therapy: Secondary | ICD-10-CM | POA: Diagnosis not present

## 2014-03-08 DIAGNOSIS — I10 Essential (primary) hypertension: Secondary | ICD-10-CM | POA: Diagnosis not present

## 2014-03-08 DIAGNOSIS — R112 Nausea with vomiting, unspecified: Secondary | ICD-10-CM | POA: Insufficient documentation

## 2014-03-08 DIAGNOSIS — R109 Unspecified abdominal pain: Secondary | ICD-10-CM | POA: Diagnosis present

## 2014-03-08 DIAGNOSIS — Z720 Tobacco use: Secondary | ICD-10-CM | POA: Insufficient documentation

## 2014-03-08 LAB — LIPASE, BLOOD: LIPASE: 30 U/L (ref 11–59)

## 2014-03-08 LAB — CBC WITH DIFFERENTIAL/PLATELET
BASOS PCT: 0 % (ref 0–1)
Basophils Absolute: 0 10*3/uL (ref 0.0–0.1)
Eosinophils Absolute: 0.1 10*3/uL (ref 0.0–0.7)
Eosinophils Relative: 1 % (ref 0–5)
HEMATOCRIT: 34.5 % — AB (ref 39.0–52.0)
Hemoglobin: 11.8 g/dL — ABNORMAL LOW (ref 13.0–17.0)
LYMPHS ABS: 2.1 10*3/uL (ref 0.7–4.0)
Lymphocytes Relative: 30 % (ref 12–46)
MCH: 32.9 pg (ref 26.0–34.0)
MCHC: 34.2 g/dL (ref 30.0–36.0)
MCV: 96.1 fL (ref 78.0–100.0)
Monocytes Absolute: 0.5 10*3/uL (ref 0.1–1.0)
Monocytes Relative: 6 % (ref 3–12)
NEUTROS ABS: 4.4 10*3/uL (ref 1.7–7.7)
NEUTROS PCT: 63 % (ref 43–77)
Platelets: 213 10*3/uL (ref 150–400)
RBC: 3.59 MIL/uL — ABNORMAL LOW (ref 4.22–5.81)
RDW: 13.4 % (ref 11.5–15.5)
WBC: 7 10*3/uL (ref 4.0–10.5)

## 2014-03-08 LAB — COMPREHENSIVE METABOLIC PANEL
ALK PHOS: 84 U/L (ref 39–117)
ALT: 14 U/L (ref 0–53)
ANION GAP: 7 (ref 5–15)
AST: 19 U/L (ref 0–37)
Albumin: 3.7 g/dL (ref 3.5–5.2)
BUN: 12 mg/dL (ref 6–23)
CHLORIDE: 105 mmol/L (ref 96–112)
CO2: 25 mmol/L (ref 19–32)
CREATININE: 1.03 mg/dL (ref 0.50–1.35)
Calcium: 9.2 mg/dL (ref 8.4–10.5)
GFR calc Af Amer: >90 mL/min (ref >90)
GFR, EST NON AFRICAN AMERICAN: 89 mL/min — AB (ref >90)
Glucose, Bld: 80 mg/dL (ref 70–99)
Potassium: 3.9 mmol/L (ref 3.5–5.1)
Sodium: 137 mmol/L (ref 135–145)
Total Bilirubin: 0.6 mg/dL (ref 0.3–1.2)
Total Protein: 6.6 g/dL (ref 6.0–8.3)

## 2014-03-08 MED ORDER — IOHEXOL 300 MG/ML  SOLN: 25.0000 mL | Freq: Once | INTRAMUSCULAR | Status: DC | PRN

## 2014-03-08 MED ORDER — ONDANSETRON HCL 4 MG/2ML IJ SOLN
4.0000 mg | Freq: Once | INTRAMUSCULAR | Status: AC
  Administered 2014-03-08: 4 mg via INTRAVENOUS
  Filled 2014-03-08: qty 2

## 2014-03-08 MED ORDER — PROMETHAZINE HCL 25 MG PO TABS: 25.0000 mg | ORAL_TABLET | Freq: Four times a day (QID) | ORAL | Status: DC | PRN

## 2014-03-08 MED ORDER — HYDROMORPHONE HCL 1 MG/ML IJ SOLN
1.0000 mg | Freq: Once | INTRAMUSCULAR | Status: AC
  Administered 2014-03-08: 1 mg via INTRAVENOUS
  Filled 2014-03-08: qty 1

## 2014-03-08 MED ORDER — IOHEXOL 300 MG/ML  SOLN
100.0000 mL | Freq: Once | INTRAMUSCULAR | Status: AC | PRN
  Administered 2014-03-08: 100 mL via INTRAVENOUS

## 2014-03-08 MED ORDER — OXYCODONE-ACETAMINOPHEN 5-325 MG PO TABS
1.0000 | ORAL_TABLET | ORAL | Status: DC | PRN
Start: 2014-03-08 — End: 2014-06-08

## 2014-03-08 MED ORDER — SODIUM CHLORIDE 0.9 % IV BOLUS (SEPSIS)
1000.0000 mL | Freq: Once | INTRAVENOUS | Status: AC
  Administered 2014-03-08: 1000 mL via INTRAVENOUS

## 2014-03-08 NOTE — ED Provider Notes (Signed)
CSN: [id number]     Arrival date & time 03/08/14  1319 History   First MD Initiated Contact with Patient 03/08/14 1649     Chief Complaint  Patient presents with  . Abdominal Pain     (Consider location/radiation/quality/duration/timing/severity/associated sxs/prior Treatment) HPI Benjamin Murillo is a 44 y.o. male with hx of hypertension, crohn's disease, chronic pain, presents to ED with complaint of abdominal pain. States he has had pain for a week. Was seen for the same 1 week ago, but states at that time, his BP was high so they ignored his abdominal pain. States pain gradually worsened since then. States pain is generalized, but worse around umbilicus. She reports associated nausea and vomiting. He reports associated diarrhea. No blood in his stool. He states he hasn't been able to eat any solids in 1 week. States he is able to keep water down. He reports he feels very dehydrated. He has history of 2 prior bowel resections due to his Crohn's and prior abscess. He is worried he may have Crohn's exacerbation. He has not taken anything for his pain at home. He is currently on Humira injections. He is followed by [facility]. His next appointment is next month.   Past Medical History  Diagnosis Date  . Crohn disease   . Chronic neck pain   . Chronic abdominal pain   . Cervical radiculopathy   . Shortness of breath     "sometimes laying down; sometimes w/activity" (10/13/2012)  . Hypertension     no meds   . Cold     2 weeks ago with cough  still   . GERD (gastroesophageal reflux disease)   . OYDXAJOI(786.7)    Past Surgical History  Procedure Laterality Date  . Bowel resection      x2  . Nevus excision      left arm + hand  . Colon surgery      drains placed to drain cyst  . Anterior cervical decomp/discectomy fusion N/A 02/12/2013    Procedure: ANTERIOR CERVICAL DECOMPRESSION/DISCECTOMY FUSION 1 LEVEL;  Surgeon: Winfield Cunas, MD;  Location: [place] [facility];  Service: Neurosurgery;   Laterality: N/A;  C67 anterior cervical decompression with fusion plating and bonegraft   History reviewed. No pertinent family history. History  Substance Use Topics  . Smoking status: Current Every Day Smoker -- 0.75 packs/day for 22 years    Types: Cigarettes  . Smokeless tobacco: Never Used  . Alcohol Use: No    Review of Systems  Constitutional: Negative for fever and chills.  Respiratory: Negative for cough, chest tightness and shortness of breath.   Cardiovascular: Negative for chest pain, palpitations and leg swelling.  Gastrointestinal: Positive for nausea, vomiting, abdominal pain and diarrhea. Negative for constipation, blood in stool and abdominal distention.  Genitourinary: Negative for dysuria, urgency, frequency and hematuria.  Musculoskeletal: Negative for myalgias, arthralgias, neck pain and neck stiffness.  Skin: Negative for rash.  Neurological: Negative for dizziness, weakness, light-headedness, numbness and headaches.  All other systems reviewed and are negative.     Allergies  Hydrocodone-acetaminophen; Tramadol; and Morphine and related  Home Medications   Prior to Admission medications   Medication Sig Start Date End Date Taking? Authorizing Provider  acetaminophen-codeine (TYLENOL #2) 300-15 MG per tablet Take 1 tablet by mouth every 4 (four) hours as needed for moderate pain. Patient not taking: Reported on 02/28/2014 01/06/14   Nita Sells, MD  budesonide (ENTOCORT EC) 3 MG 24 hr capsule Take 2 capsules (6  mg total) by mouth daily. Patient not taking: Reported on 02/28/2014 01/06/14   Nita Sells, MD  erythromycin [facility]) ophthalmic ointment Place 1 application into the right eye 4 (four) times daily. Patient not taking: Reported on 01/03/2014 10/15/13   Courtney A Forcucci, PA-C  HUMIRA PEN 40 MG/0.8ML injection Inject 40 mg as directed every 14 (fourteen) days.  03/22/13   Historical Provider, MD  metoCLOPramide (REGLAN) 10 MG tablet Take  1 tablet (10 mg total) by mouth every 6 (six) hours as needed for nausea (or headache). 4/0/97   [name], MD  predniSONE (DELTASONE) 20 MG tablet Take 2 tablets (40 mg total) by mouth daily. 04/02/30   [name], MD   BP 992/42 mmHg  Pulse 58  Temp(Src) 98.3 F (36.8 C) (Oral)  Resp 15  SpO2 100% Physical Exam  Constitutional: He appears well-developed and well-nourished. No distress.  HENT:  Head: Normocephalic and atraumatic.  Eyes: Conjunctivae are normal.  Neck: Neck supple.  Cardiovascular: Normal rate, regular rhythm and normal heart sounds.   Pulmonary/Chest: Effort normal. No respiratory distress. He has no wheezes. He has no rales.  Abdominal: Soft. Bowel sounds are normal. He exhibits no distension. There is tenderness. There is no rebound and no guarding.  Diffuse tenderness. No guarding  Musculoskeletal: He exhibits no edema.  Neurological: He is alert.  Skin: Skin is warm and dry.  Nursing note and vitals reviewed.   ED Course  Procedures (including critical care time) Labs Review Labs Reviewed  CBC WITH DIFFERENTIAL/PLATELET - Abnormal; Notable for the following:    RBC 3.59 (*)    Hemoglobin 11.8 (*)    HCT 34.5 (*)    All other components within normal limits  COMPREHENSIVE METABOLIC PANEL - Abnormal; Notable for the following:    GFR calc non Af Amer 89 (*)    All other components within normal limits  LIPASE, BLOOD    Imaging Review Ct Abdomen Pelvis W Contrast  03/08/2014   CLINICAL DATA:  Abdominal pain, nausea, vomiting, diarrhea. History of Crohn disease. Chronic neck pain. Chronic abdominal pain. Shortness of breath. Cold. Gastroesophageal reflux disease. Headache.  EXAM: CT ABDOMEN AND PELVIS WITH CONTRAST  TECHNIQUE: Multidetector CT imaging of the abdomen and pelvis was performed using the standard protocol following bolus administration of intravenous contrast.  CONTRAST:  188mL OMNIPAQUE IOHEXOL 300 MG/ML  SOLN  COMPARISON:  01/03/2014   FINDINGS: Mild dependent changes in the lung bases.  Liver, spleen, gallbladder, pancreas, adrenal glands, kidneys, abdominal aorta, inferior vena cava, and retroperitoneal lymph nodes are unremarkable. Stomach and small bowel are incompletely opacified, limiting evaluation for wall thickening but there is no evidence of abnormal bowel distention. Contrast material flows through to the colon without evidence of bowel obstruction. Colon is well filled with contrast material in demonstrates normal appearance. No evidence of colonic distention or wall thickening. No free air or free fluid in the abdomen.  Pelvis: The bladder wall is not thickened. Prostate gland is not enlarged. No free or loculated pelvic fluid collections. No pelvic mass or lymphadenopathy. No destructive bone lesions.  IMPRESSION: No CT findings to suggest active Crohn's disease. No bowel obstruction. No colonic or bowel wall thickening.   Electronically Signed   By: Lucienne Capers M.D.   On: 03/08/2014 20:51     EKG Interpretation None      MDM   Final diagnoses:  Generalized abdominal pain     patient with history of Crohn's disease and prior intra-abdominal abscess  and bowel resection 2, here for abdominal pain that he states feels like prior Crohn's exacerbation. Abdomen is diffusely tender, however no peritoneal signs. Given his pain for a week with no improvement, prior history, will get CT abdomen and pelvis. Labs are pending. Dilaudid ordered for pain, IV fluids started.  9:06 PM Pain controlled with IV dilaudid and zofran. Pt able to tolerate PO fluids. Labs all normal. CT negative. Pt is comfortable going home. Will d/c home with percocet, phenergan, follow up with [facility]. He will call them tomorrow.  Filed Vitals:   03/08/14 1845 03/08/14 1900 03/08/14 1915 03/08/14 1930  BP: 134/88 123/75 126/79 132/85  Pulse: 52 49 50 51  Temp:      TempSrc:      Resp: 12 12 14 13   SpO2: 100% 99% 98% 97%     Renold Genta, PA-C 03/08/14 2107  Hoy Morn, MD 03/08/14 2114

## 2014-03-08 NOTE — Discharge Instructions (Signed)
Take phenergan as prescribed as needed for nausea. Percocet for pain. Clear liquids for 24-48 hrs. Follow up with gastroenterologist as soon as able.    Abdominal Pain Many things can cause abdominal pain. Usually, abdominal pain is not caused by a disease and will improve without treatment. It can often be observed and treated at home. Your health care provider will do a physical exam and possibly order blood tests and X-rays to help determine the seriousness of your pain. However, in many cases, more time must pass before a clear cause of the pain can be found. Before that point, your health care provider may not know if you need more testing or further treatment. HOME CARE INSTRUCTIONS  Monitor your abdominal pain for any changes. The following actions may help to alleviate any discomfort you are experiencing:  Only take over-the-counter or prescription medicines as directed by your health care provider.  Do not take laxatives unless directed to do so by your health care provider.  Try a clear liquid diet (broth, tea, or water) as directed by your health care provider. Slowly move to a bland diet as tolerated. SEEK MEDICAL CARE IF:  You have unexplained abdominal pain.  You have abdominal pain associated with nausea or diarrhea.  You have pain when you urinate or have a bowel movement.  You experience abdominal pain that wakes you in the night.  You have abdominal pain that is worsened or improved by eating food.  You have abdominal pain that is worsened with eating fatty foods.  You have a fever. SEEK IMMEDIATE MEDICAL CARE IF:   Your pain does not go away within 2 hours.  You keep throwing up (vomiting).  Your pain is felt only in portions of the abdomen, such as the right side or the left lower portion of the abdomen.  You pass bloody or black tarry stools. MAKE SURE YOU:  Understand these instructions.   Will watch your condition.   Will get help right away if you  are not doing well or get worse.  Document Released: 10/24/2004 Document Revised: 01/19/2013 Document Reviewed: 09/23/2012 [facility] Patient Information 2015 [place], [place]. This information is not intended to replace advice given to you by your health care provider. Make sure you discuss any questions you have with your health care provider.

## 2014-03-08 NOTE — ED Notes (Signed)
Pt sts abd pain with diarrhea with hx of crohns and thinks having flare

## 2014-06-02 IMAGING — CR DG ABDOMEN ACUTE W/ 1V CHEST
3 series · 3 of 3 positions shown · non-contrast
Comparison: Previous examinations.

CLINICAL DATA: Abdominal pain, nausea and vomiting.  History of
Crohn's disease.

ACUTE ABDOMEN SERIES (ABDOMEN 2 VIEW & CHEST 1 VIEW)

[w chest pa]
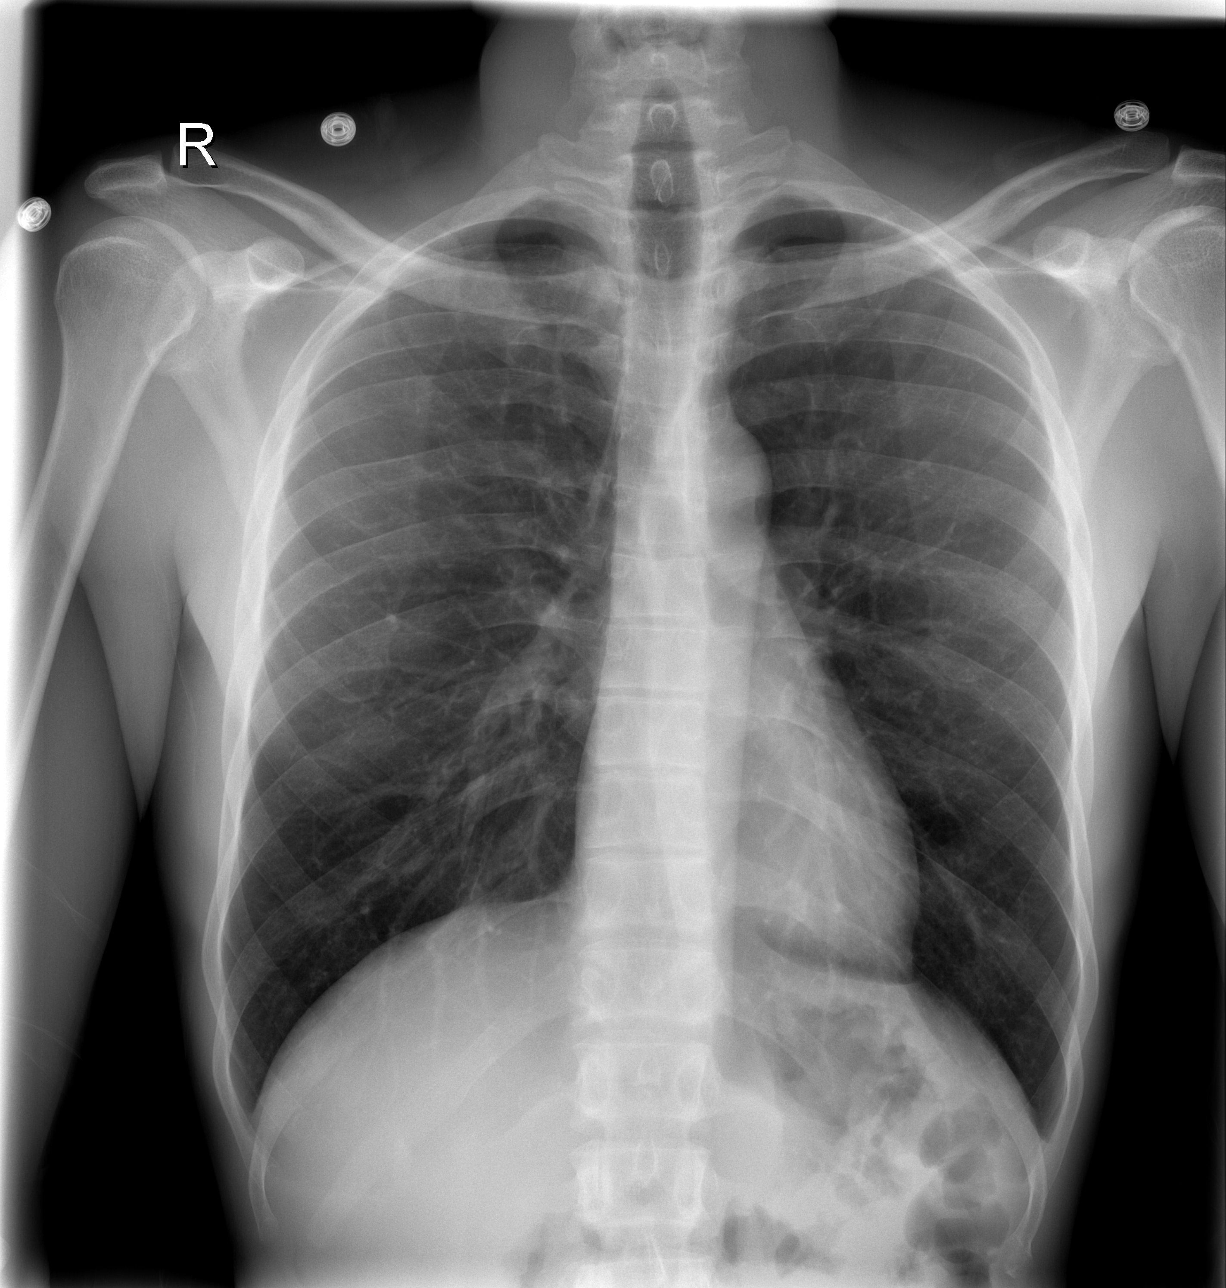

[w abdomen upright]
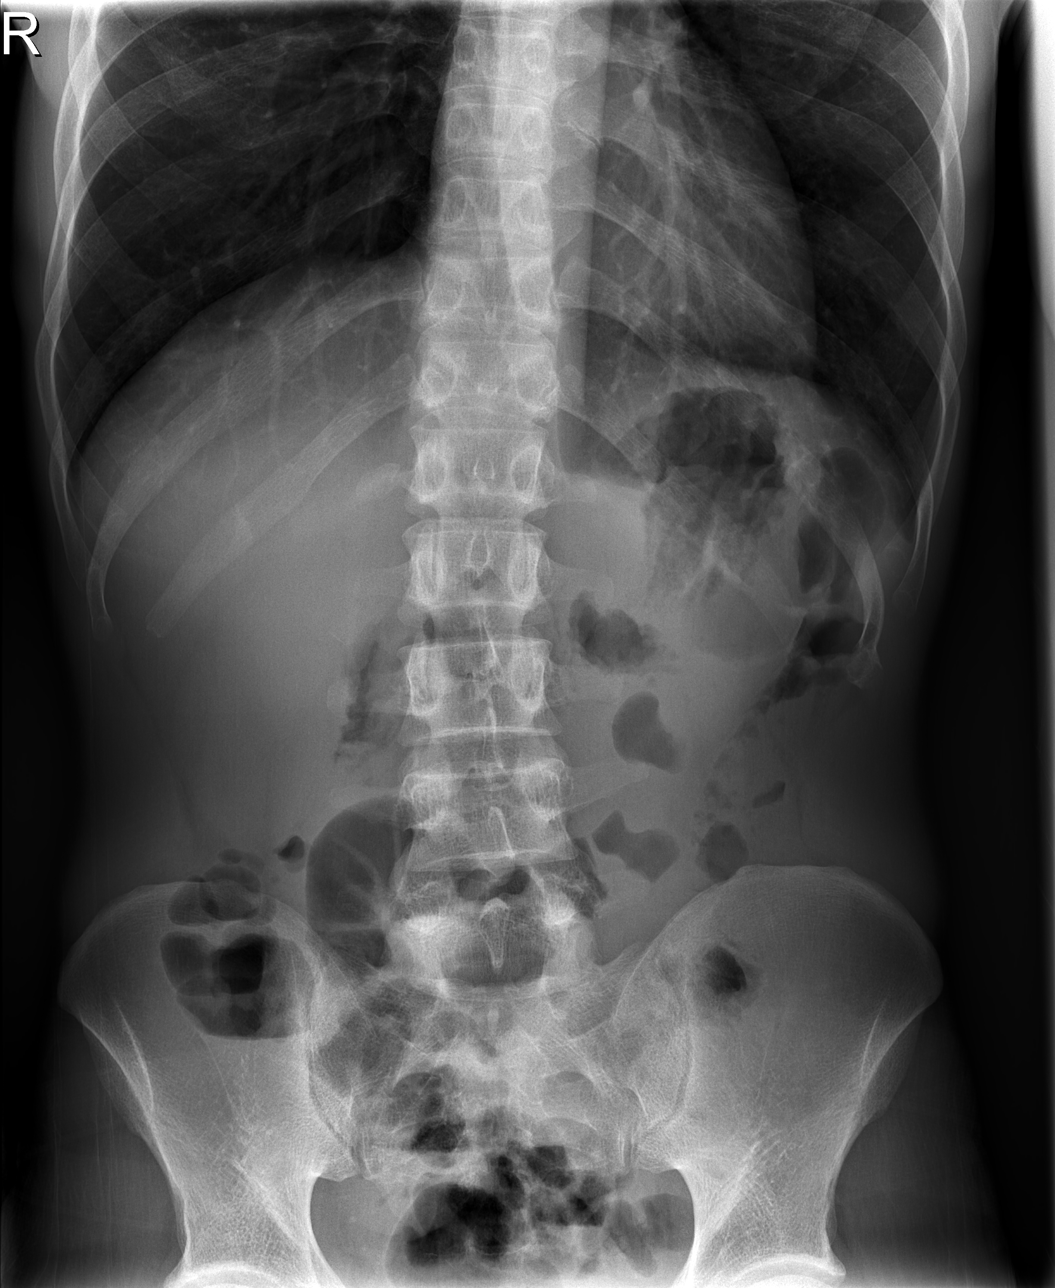

[t abdomen supine]
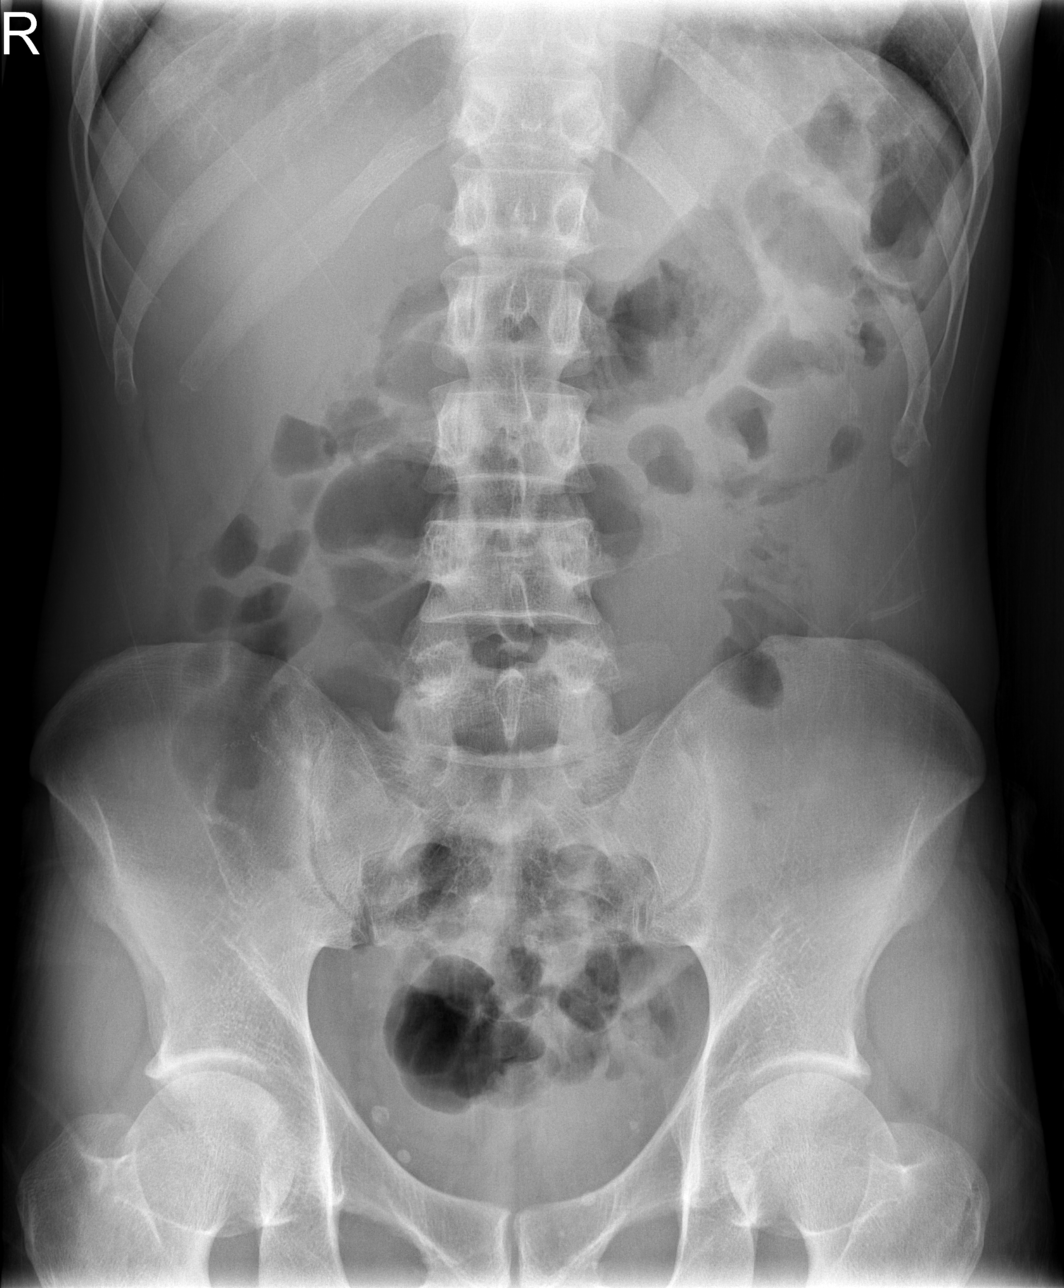

[3 of 3 positions shown; findings below may reference images not displayed]

FINDINGS: Normal sized heart.  Clear lungs.  Right lower quadrant
surgical staples.  Mildly prominent bowel loops in the right lower
abdomen and pelvis with some air-fluid levels.  No free peritoneal
air.  Minimal scoliosis.
IMPRESSION: Minimal ileus or partial obstruction involving bowel loops in the
pelvis and right lower abdomen.

## 2014-06-07 ENCOUNTER — Encounter (HOSPITAL_COMMUNITY): Payer: Self-pay | Admitting: Emergency Medicine

## 2014-06-07 DIAGNOSIS — Z720 Tobacco use: Secondary | ICD-10-CM | POA: Diagnosis not present

## 2014-06-07 DIAGNOSIS — I10 Essential (primary) hypertension: Secondary | ICD-10-CM | POA: Insufficient documentation

## 2014-06-07 DIAGNOSIS — R1084 Generalized abdominal pain: Secondary | ICD-10-CM | POA: Diagnosis present

## 2014-06-07 DIAGNOSIS — Z8739 Personal history of other diseases of the musculoskeletal system and connective tissue: Secondary | ICD-10-CM | POA: Insufficient documentation

## 2014-06-07 DIAGNOSIS — G8929 Other chronic pain: Secondary | ICD-10-CM | POA: Insufficient documentation

## 2014-06-07 DIAGNOSIS — K50111 Crohn's disease of large intestine with rectal bleeding: Secondary | ICD-10-CM | POA: Insufficient documentation

## 2014-06-07 LAB — COMPREHENSIVE METABOLIC PANEL
ALBUMIN: 3.6 g/dL (ref 3.5–5.0)
ALK PHOS: 78 U/L (ref 38–126)
ALT: 14 U/L — ABNORMAL LOW (ref 17–63)
ANION GAP: 8 (ref 5–15)
AST: 24 U/L (ref 15–41)
BUN: 9 mg/dL (ref 6–20)
CALCIUM: 9.2 mg/dL (ref 8.9–10.3)
CO2: 24 mmol/L (ref 22–32)
CREATININE: 1.02 mg/dL (ref 0.61–1.24)
Chloride: 102 mmol/L (ref 101–111)
GFR calc non Af Amer: >60 mL/min (ref >60)
GLUCOSE: 111 mg/dL — AB (ref 70–99)
POTASSIUM: 3.3 mmol/L — AB (ref 3.5–5.1)
SODIUM: 134 mmol/L — AB (ref 135–145)
TOTAL PROTEIN: 6.6 g/dL (ref 6.5–8.1)
Total Bilirubin: 1 mg/dL (ref 0.3–1.2)

## 2014-06-07 LAB — CBC WITH DIFFERENTIAL/PLATELET
Basophils Absolute: 0 10*3/uL (ref 0.0–0.1)
Basophils Relative: 0 % (ref 0–1)
EOS PCT: 2 % (ref 0–5)
Eosinophils Absolute: 0.2 10*3/uL (ref 0.0–0.7)
HCT: 34.5 % — ABNORMAL LOW (ref 39.0–52.0)
HEMOGLOBIN: 12.1 g/dL — AB (ref 13.0–17.0)
LYMPHS ABS: 2.7 10*3/uL (ref 0.7–4.0)
LYMPHS PCT: 32 % (ref 12–46)
MCH: 32.3 pg (ref 26.0–34.0)
MCHC: 35.1 g/dL (ref 30.0–36.0)
MCV: 92 fL (ref 78.0–100.0)
MONOS PCT: 11 % (ref 3–12)
Monocytes Absolute: 0.9 10*3/uL (ref 0.1–1.0)
Neutro Abs: 4.6 10*3/uL (ref 1.7–7.7)
Neutrophils Relative %: 55 % (ref 43–77)
PLATELETS: 228 10*3/uL (ref 150–400)
RBC: 3.75 MIL/uL — AB (ref 4.22–5.81)
RDW: 12.9 % (ref 11.5–15.5)
WBC: 8.3 10*3/uL (ref 4.0–10.5)

## 2014-06-07 NOTE — ED Notes (Signed)
Pt on humeria for crohns; sees MD at [facility]; nauseated and diarr with red blood. Reports mild dizziness first thing in morning but not usually. Vomiting for past 2 days.

## 2014-06-08 ENCOUNTER — Emergency Department (HOSPITAL_COMMUNITY)
Admission: EM | Admit: 2014-06-08 | Discharge: 2014-06-08 | Disposition: A | Discharge status: Home / Self Care | Payer: Medicare Other | Attending: Emergency Medicine | Admitting: Emergency Medicine

## 2014-06-08 DIAGNOSIS — K50111 Crohn's disease of large intestine with rectal bleeding: Secondary | ICD-10-CM | POA: Diagnosis not present

## 2014-06-08 MED ORDER — OXYCODONE-ACETAMINOPHEN 5-325 MG PO TABS: 1.0000 | ORAL_TABLET | ORAL | Status: DC | PRN

## 2014-06-08 MED ORDER — DICYCLOMINE HCL 20 MG PO TABS: 20.0000 mg | ORAL_TABLET | Freq: Four times a day (QID) | ORAL | Status: DC | PRN

## 2014-06-08 MED ORDER — PREDNISONE 10 MG PO TABS: ORAL_TABLET | ORAL | Status: DC

## 2014-06-08 MED ORDER — ONDANSETRON 4 MG PO TBDP
8.0000 mg | ORAL_TABLET | Freq: Once | ORAL | Status: AC
  Administered 2014-06-08: 8 mg via ORAL
  Filled 2014-06-08: qty 2

## 2014-06-08 MED ORDER — PROMETHAZINE HCL 25 MG PO TABS: 25.0000 mg | ORAL_TABLET | Freq: Four times a day (QID) | ORAL | Status: DC | PRN

## 2014-06-08 MED ORDER — ONDANSETRON 8 MG PO TBDP: 8.0000 mg | ORAL_TABLET | Freq: Three times a day (TID) | ORAL | Status: DC | PRN

## 2014-06-08 MED ORDER — DICYCLOMINE HCL 10 MG PO CAPS
20.0000 mg | ORAL_CAPSULE | Freq: Once | ORAL | Status: AC
  Administered 2014-06-08: 20 mg via ORAL
  Filled 2014-06-08: qty 2

## 2014-06-08 MED ORDER — PANTOPRAZOLE SODIUM 40 MG PO TBEC: 40.0000 mg | DELAYED_RELEASE_TABLET | Freq: Every day | ORAL | Status: DC

## 2014-06-08 MED ORDER — PREDNISONE 20 MG PO TABS
60.0000 mg | ORAL_TABLET | Freq: Once | ORAL | Status: AC
  Administered 2014-06-08: 60 mg via ORAL
  Filled 2014-06-08: qty 3

## 2014-06-08 MED ORDER — PANTOPRAZOLE SODIUM 40 MG PO TBEC
40.0000 mg | DELAYED_RELEASE_TABLET | Freq: Every day | ORAL | Status: DC
  Administered 2014-06-08: 40 mg via ORAL
  Filled 2014-06-08: qty 1

## 2014-06-08 NOTE — Discharge Instructions (Signed)
It is very important for you to follow-up with a primary care doctor and a gastroenterologist.  Unfortunately, in the emergency department does not manage chronic conditions well.  Take medications as prescribed.  Return to the emergency department for worsening condition or new concerning symptoms.  Use the numbers listed above to establish a local gastroenterologist.    Crohn Disease Crohn disease is a long-term (chronic) soreness and redness (inflammation) of the intestines (bowel). It can affect any portion of the digestive tract, from the mouth to the anus. It can also cause problems outside the digestive tract. Crohn disease is closely related to a disease called ulcerative colitis (together, these two diseases are called inflammatory bowel disease).  CAUSES  The cause of Crohn disease is not known. One Link Snuffer is that, in an easily affected person, the immune system is triggered to attack the body's own digestive tissue. Crohn disease runs in families. It seems to be more common in certain geographic areas and amongst certain races. There are no clear-cut dietary causes.  SYMPTOMS  Crohn disease can cause many different symptoms since it can affect many different parts of the body. Symptoms include:  Fatigue.  Weight loss.  Chronic diarrhea, sometime bloody.  Abdominal pain and cramps.  Fever.  Ulcers or canker sores in the mouth or rectum.  Anemia (low red blood cells).  Arthritis, skin problems, and eye problems may occur. Complications of Crohn disease can include:  Series of holes (perforation) of the bowel.  Portions of the intestines sticking to each other (adhesions).  Obstruction of the bowel.  Fistula formation, typically in the rectal area but also in other areas. A fistula is an opening between the bowels and the outside, or between the bowels and another organ.  A painful crack in the mucous membrane of the anus (rectal fissure). DIAGNOSIS  Your caregiver may  suspect Crohn disease based on your symptoms and an exam. Blood tests may confirm that there is a problem. You may be asked to submit a stool specimen for examination. X-rays and CT scans may be necessary. Ultimately, the diagnosis is usually made after a procedure that uses a flexible tube that is inserted via your mouth or your anus. This is done under sedation and is called either an upper endoscopy or colonoscopy. With these tests, the specialist can take tiny tissue samples and remove them from the inside of the bowel (biopsy). Examination of this biopsy tissue under a microscope can reveal Crohn disease as the cause of your symptoms. Due to the many different forms that Crohn disease can take, symptoms may be present for several years before a diagnosis is made. TREATMENT  Medications are often used to decrease inflammation and control the immune system. These include medicines related to aspirin, steroid medications, and newer and stronger medications to slow down the immune system. Some medications may be used as suppositories or enemas. A number of other medications are used or have been studied. Your caregiver will make specific recommendations. HOME CARE INSTRUCTIONS   Symptoms such as diarrhea can be controlled with medications. Avoid foods that have a laxative effect such as fresh fruit, vegetables, and dairy products. During flare-ups, you can rest your bowel by refraining from solid foods. Drink clear liquids frequently during the day. (Electrolyte or rehydrating fluids are best. Your caregiver can help you with suggestions.) Drink often to prevent loss of body fluids (dehydration). When diarrhea has cleared, eat small meals and more frequently. Avoid food additives and stimulants such  as caffeine (coffee, tea, or chocolate). Enzyme supplements may help if you develop intolerance to a sugar in dairy products (lactose). Ask your caregiver or dietitian about specific dietary instructions.  Try  to maintain a positive attitude. Learn relaxation techniques such as self-hypnosis, mental imaging, and muscle relaxation.  If possible, avoid stresses which can aggravate your condition.  Exercise regularly.  Follow your diet.  Always get plenty of rest. SEEK MEDICAL CARE IF:   Your symptoms fail to improve after a week or two of new treatment.  You experience continued weight loss.  You have ongoing cramps or loose bowels.  You develop a new skin rash, skin sores, or eye problems. SEEK IMMEDIATE MEDICAL CARE IF:   You have worsening of your symptoms or develop new symptoms.  You have a fever.  You develop bloody diarrhea.  You develop severe abdominal pain. MAKE SURE YOU:   Understand these instructions.  Will watch your condition.  Will get help right away if you are not doing well or get worse. Document Released: 10/24/2004 Document Revised: 05/31/2013 Document Reviewed: 09/22/2006 [facility] Patient Information 2015 [place], [place]. This information is not intended to replace advice given to you by your health care provider. Make sure you discuss any questions you have with your health care provider.

## 2014-06-08 NOTE — ED Notes (Signed)
This RN entered pts room to take pts discharge vital signs and to go over the discharge paper work. As this RN reached to grab the thermometer the pt stated "You don't need to go over my paper work, I already know everything, I'm leaving". This RN stated that his temperature needed to be taken for his discharge paperwork, the pt stated "nobody ever took my temperature the whole time I was here, why do they have to do it now? I'm leaving!" the pt then walked out of the room with his discharge paper work in hand and refused to sign.

## 2014-06-08 NOTE — ED Notes (Addendum)
Pt began cussing at this nurse saying "Well I could have just stayed at home, this is B......t." This RN repeatedly asked the pt what he would like to happen concerning his care and the pt repeatedly refused to answer. The pt was then told again that this RN would speak to the MD about his care and the pt stated "just get your F....g A.. Out of here!" pt was told that cursing at staff would not be tolerated and that if the behavior continued security would be called to his room to speak to him. MD notified.

## 2014-06-08 NOTE — ED Notes (Signed)
Pt states that he cannot see his GI dr until July

## 2014-06-08 NOTE — ED Provider Notes (Signed)
CSN: [id number]     Arrival date & time 06/07/14  2207 History   First MD Initiated Contact with Patient 06/08/14 0425     Chief Complaint  Patient presents with  . Abdominal Pain     (Consider location/radiation/quality/duration/timing/severity/associated sxs/prior Treatment) HPI 44 year old male presents to emergency department from home with complaint of one month of diffuse crampy abdominal pain, nausea with occasional vomiting, diarrhea.  He reports seeing some small amount of blood in his stool at times.  He reports that he has had weight loss.  Patient has history of Crohn's disease, is on Humira.  Patient does not have a local primary care doctor.  He sees gastroenterology at [facility], but has not seen them in over a year.  He reports he currently takes Humira.  Patient seen in the emergency department in February, reports he was unable to follow-up with his gastroenterologist after that evaluation.  He denies any fever. Past Medical History  Diagnosis Date  . Crohn disease   . Chronic neck pain   . Chronic abdominal pain   . Cervical radiculopathy   . Shortness of breath     "sometimes laying down; sometimes w/activity" (10/13/2012)  . Hypertension     no meds   . Cold     2 weeks ago with cough  still   . GERD (gastroesophageal reflux disease)   . MIWOEHOZ(224.8)    Past Surgical History  Procedure Laterality Date  . Bowel resection      x2  . Nevus excision      left arm + hand  . Colon surgery      drains placed to drain cyst  . Anterior cervical decomp/discectomy fusion N/A 02/12/2013    Procedure: ANTERIOR CERVICAL DECOMPRESSION/DISCECTOMY FUSION 1 LEVEL;  Surgeon: Winfield Cunas, MD;  Location: [place] [facility];  Service: Neurosurgery;  Laterality: N/A;  C67 anterior cervical decompression with fusion plating and bonegraft   History reviewed. No pertinent family history. History  Substance Use Topics  . Smoking status: Current Every Day Smoker -- 0.75 packs/day for 22  years    Types: Cigarettes  . Smokeless tobacco: Never Used  . Alcohol Use: No    Review of Systems  See History of Present Illness; otherwise all other systems are reviewed and negative   Allergies  Hydrocodone-acetaminophen; Tramadol; and Morphine and related  Home Medications   Prior to Admission medications   Medication Sig Start Date End Date Taking? Authorizing Provider  HUMIRA PEN 40 MG/0.8ML injection Inject 40 mg as directed every 14 (fourteen) days.  03/22/13  Yes Historical Provider, MD  acetaminophen-codeine (TYLENOL #2) 300-15 MG per tablet Take 1 tablet by mouth every 4 (four) hours as needed for moderate pain. Patient not taking: Reported on 02/28/2014 01/06/14   Nita Sells, MD  budesonide (ENTOCORT EC) 3 MG 24 hr capsule Take 2 capsules (6 mg total) by mouth daily. Patient not taking: Reported on 02/28/2014 01/06/14   Nita Sells, MD  erythromycin [facility]) ophthalmic ointment Place 1 application into the right eye 4 (four) times daily. Patient not taking: Reported on 01/03/2014 10/15/13   Starlyn Skeans, PA-C  metoCLOPramide (REGLAN) 10 MG tablet Take 1 tablet (10 mg total) by mouth every 6 (six) hours as needed for nausea (or headache). Patient not taking: Reported on 03/08/2014 03/04/98   [name], MD  oxyCODONE-acetaminophen (PERCOCET) 5-325 MG per tablet Take 1 tablet by mouth every 4 (four) hours as needed for severe pain. Patient not taking: Reported  on 06/07/2014 03/08/14   Lahoma Rocker Kirichenko, PA-C  predniSONE (DELTASONE) 20 MG tablet Take 2 tablets (40 mg total) by mouth daily. Patient not taking: Reported on 03/08/2014 5/0/27   [name], MD  promethazine (PHENERGAN) 25 MG tablet Take 1 tablet (25 mg total) by mouth every 6 (six) hours as needed for nausea or vomiting. Patient not taking: Reported on 06/07/2014 03/08/14   Tatyana Kirichenko, PA-C   BP 152/95 mmHg  Pulse 55  Temp(Src) 98.2 F (36.8 C) (Oral)  Resp 18  SpO2 99% Physical Exam   Constitutional: He is oriented to person, place, and time. He appears well-developed and well-nourished. No distress.  HENT:  Head: Normocephalic and atraumatic.  Nose: Nose normal.  Mouth/Throat: Oropharynx is clear and moist.  Eyes: Conjunctivae and EOM are normal. Pupils are equal, round, and reactive to light.  Neck: Normal range of motion. Neck supple. No JVD present. No tracheal deviation present. No thyromegaly present.  Cardiovascular: Normal rate, regular rhythm, normal heart sounds and intact distal pulses.  Exam reveals no gallop and no friction rub.   No murmur heard. Pulmonary/Chest: Effort normal and breath sounds normal. No stridor. No respiratory distress. He has no wheezes. He has no rales. He exhibits no tenderness.  Abdominal: Soft. Bowel sounds are normal. He exhibits no distension and no mass. There is tenderness (diffuse tenderness with palpation). There is no rebound and no guarding.  Musculoskeletal: Normal range of motion. He exhibits no edema or tenderness.  Lymphadenopathy:    He has no cervical adenopathy.  Neurological: He is alert and oriented to person, place, and time. He displays normal reflexes. He exhibits normal muscle tone. Coordination normal.  Skin: Skin is warm and dry. No rash noted. No erythema. No pallor.  Psychiatric: He has a normal mood and affect. His behavior is normal. Judgment and thought content normal.  Nursing note and vitals reviewed.   ED Course  Procedures (including critical care time) Labs Review Labs Reviewed  COMPREHENSIVE METABOLIC PANEL - Abnormal; Notable for the following:    Sodium 134 (*)    Potassium 3.3 (*)    Glucose, Bld 111 (*)    ALT 14 (*)    All other components within normal limits  CBC WITH DIFFERENTIAL/PLATELET - Abnormal; Notable for the following:    RBC 3.75 (*)    Hemoglobin 12.1 (*)    HCT 34.5 (*)    All other components within normal limits  URINALYSIS, ROUTINE W REFLEX MICROSCOPIC    Imaging  Review No results found.   EKG Interpretation None      MDM   Final diagnoses:  Crohn's colitis, with rectal bleeding    44 year old male with history of Crohn's disease who presents with a month long of nausea, vomiting, diarrhea and abdominal pain.  Plan to place him back on his Protonix and Bentyl.  Will put him on a steroid taper.  Have encouraged him to follow-up with a local gastroenterologist as he has difficulties getting back and forth to [facility] him and getting scheduled to see his gastroenterologist.      Linton Flemings, MD 06/08/14 [phone]

## 2014-06-08 NOTE — ED Notes (Signed)
Pt. Requesting meds for nausea.

## 2014-07-18 ENCOUNTER — Emergency Department: Payer: Medicare Other

## 2014-07-18 ENCOUNTER — Encounter: Payer: Self-pay | Admitting: Emergency Medicine

## 2014-07-18 ENCOUNTER — Emergency Department
Admission: EM | Admit: 2014-07-18 | Discharge: 2014-07-18 | Disposition: A | Discharge status: Home / Self Care | Payer: Medicare Other | Attending: Emergency Medicine | Admitting: Emergency Medicine

## 2014-07-18 DIAGNOSIS — S61243A Puncture wound with foreign body of left middle finger without damage to nail, initial encounter: Secondary | ICD-10-CM | POA: Insufficient documentation

## 2014-07-18 DIAGNOSIS — X58XXXA Exposure to other specified factors, initial encounter: Secondary | ICD-10-CM | POA: Insufficient documentation

## 2014-07-18 DIAGNOSIS — Y9389 Activity, other specified: Secondary | ICD-10-CM | POA: Insufficient documentation

## 2014-07-18 DIAGNOSIS — Y998 Other external cause status: Secondary | ICD-10-CM | POA: Insufficient documentation

## 2014-07-18 DIAGNOSIS — Y9289 Other specified places as the place of occurrence of the external cause: Secondary | ICD-10-CM | POA: Insufficient documentation

## 2014-07-18 DIAGNOSIS — Z23 Encounter for immunization: Secondary | ICD-10-CM | POA: Diagnosis not present

## 2014-07-18 DIAGNOSIS — Z79899 Other long term (current) drug therapy: Secondary | ICD-10-CM | POA: Insufficient documentation

## 2014-07-18 DIAGNOSIS — Z720 Tobacco use: Secondary | ICD-10-CM | POA: Insufficient documentation

## 2014-07-18 DIAGNOSIS — S60459A Superficial foreign body of unspecified finger, initial encounter: Secondary | ICD-10-CM

## 2014-07-18 MED ORDER — TETANUS-DIPHTHERIA TOXOIDS TD 5-2 LFU IM INJ
INJECTION | INTRAMUSCULAR | Status: AC
  Administered 2014-07-18: 0.5 mL via INTRAMUSCULAR
  Filled 2014-07-18: qty 0.5

## 2014-07-18 MED ORDER — TETANUS-DIPHTH-ACELL PERTUSSIS 5-2.5-18.5 LF-MCG/0.5 IM SUSP: 0.5000 mL | Freq: Once | INTRAMUSCULAR | Status: DC

## 2014-07-18 MED ORDER — CEPHALEXIN 500 MG PO CAPS: 500.0000 mg | ORAL_CAPSULE | Freq: Three times a day (TID) | ORAL | Status: DC

## 2014-07-18 NOTE — Discharge Instructions (Signed)
WOUND CARE   Keep area clean and dry for 24 hours. Do not remove bandage, if applied.  After 24 hours, remove bandage and wash wound gently with mild soap and warm water. Reapply a new bandage after cleaning wound, if directed.  Continue daily cleansing with soap and water until stitches/staples are removed.  Notify the office if you experience any of the following signs of infection: Swelling, redness, pus drainage, streaking, fever >101.0 F  Notify the office if you experience excessive bleeding that does not stop after 15-20 minutes of constant, firm pressure.

## 2014-07-18 NOTE — ED Notes (Signed)
Possible f.b. Is in left middle finger instead of right middle finger   X-ray order changed

## 2014-07-18 NOTE — ED Notes (Signed)
Pt reports that he was working on his car and got a piece of metal in his left middle finger. Unsure of his last tetanus

## 2014-07-18 NOTE — ED Provider Notes (Signed)
[facility] Emergency Department Provider Note  ____________________________________________  Time [id number] I have reviewed the triage vital signs and the nursing notes.   HISTORY  Chief Complaint Foreign Body in Skin   HPI SMITTY ACKERLEY is a 44 y.o. male patient is here with complaint of metal foreign body in his left middle finger. He states that he was working on his car this afternoon when a metal when his finger. He is unsure of his last tetanus shot. Tends to take it out at home were unsuccessful due to patient's pain tolerance. Currently he rates his pain 9 out of 10in range of motion increases his pain or touching it.   Past Medical History  Diagnosis Date  . Crohn disease   . Chronic neck pain   . Chronic abdominal pain   . Cervical radiculopathy   . Shortness of breath     "sometimes laying down; sometimes w/activity" (10/13/2012)  . Hypertension     no meds   . Cold     2 weeks ago with cough  still   . GERD (gastroesophageal reflux disease)   . ASNKNLZJ(673.4)     Patient Active Problem List   Diagnosis Date Noted  . AP (abdominal pain)   . Crohn disease   . Crohn's colitis 01/03/2014  . HNP (herniated nucleus pulposus), cervical 02/12/2013  . Tobacco abuse 10/14/2012  . Noncompliance 10/14/2012  . Acute abdominal pain 10/13/2012  . Chronic abdominal pain   . Crohn's ileitis 06/06/2012    Past Surgical History  Procedure Laterality Date  . Bowel resection      x2  . Nevus excision      left arm + hand  . Colon surgery      drains placed to drain cyst  . Anterior cervical decomp/discectomy fusion N/A 02/12/2013    Procedure: ANTERIOR CERVICAL DECOMPRESSION/DISCECTOMY FUSION 1 LEVEL;  Surgeon: Winfield Cunas, MD;  Location: [place] [facility];  Service: Neurosurgery;  Laterality: N/A;  C67 anterior cervical decompression with fusion plating and bonegraft    Current Outpatient Rx  Name  Route  Sig  Dispense  Refill  .  acetaminophen-codeine (TYLENOL #2) 300-15 MG per tablet   Oral   Take 1 tablet by mouth every 4 (four) hours as needed for moderate pain. Patient not taking: Reported on 02/28/2014   30 tablet   0   . budesonide (ENTOCORT EC) 3 MG 24 hr capsule   Oral   Take 2 capsules (6 mg total) by mouth daily. Patient not taking: Reported on 02/28/2014   20 capsule   0   . cephALEXin (KEFLEX) 500 MG capsule   Oral   Take 1 capsule (500 mg total) by mouth 3 (three) times daily.   21 capsule   0   . dicyclomine (BENTYL) 20 MG tablet   Oral   Take 1 tablet (20 mg total) by mouth every 6 (six) hours as needed for spasms (for abdominal cramping).   20 tablet   0   . erythromycin (ROMYCIN) ophthalmic ointment   Right Eye   Place 1 application into the right eye 4 (four) times daily. Patient not taking: Reported on 01/03/2014   3.5 g   0   . HUMIRA PEN 40 MG/0.8ML injection   Injection   Inject 40 mg as directed every 14 (fourteen) days.          . metoCLOPramide (REGLAN) 10 MG tablet   Oral   Take 1 tablet (  10 mg total) by mouth every 6 (six) hours as needed for nausea (or headache). Patient not taking: Reported on 03/08/2014   30 tablet   0   . ondansetron (ZOFRAN ODT) 8 MG disintegrating tablet   Oral   Take 1 tablet (8 mg total) by mouth every 8 (eight) hours as needed for nausea or vomiting.   20 tablet   0   . oxyCODONE-acetaminophen (PERCOCET) 5-325 MG per tablet   Oral   Take 1 tablet by mouth every 4 (four) hours as needed for severe pain.   20 tablet   0   . pantoprazole (PROTONIX) 40 MG tablet   Oral   Take 1 tablet (40 mg total) by mouth daily.   30 tablet   0   . predniSONE (DELTASONE) 10 MG tablet      Take 6 tabs for 2 days, 5 tabs for 2 days, 4 tabs for 2 days, 3 tabs for 2 days, 2 tabs for 2 days   40 tablet   0   . promethazine (PHENERGAN) 25 MG tablet   Oral   Take 1 tablet (25 mg total) by mouth every 6 (six) hours as needed for nausea or vomiting.    12 tablet   0     Allergies Hydrocodone-acetaminophen; Tramadol; and Morphine and related  History reviewed. No pertinent family history.  Social History History  Substance Use Topics  . Smoking status: Current Every Day Smoker -- 0.75 packs/day for 22 years    Types: Cigarettes  . Smokeless tobacco: Never Used  . Alcohol Use: No    Review of Systems Constitutional: No fever/chills Eyes: No visual changes. ENT: No sore throat. Cardiovascular: Denies chest pain. Respiratory: Denies shortness of breath. Gastrointestinal: No abdominal pain.  No nausea, no vomiting.  Musculoskeletal: Negative for back pain. Skin: Negative for rash. Neurological: Negative for headaches, focal weakness or numbness.  10-point ROS otherwise negative.  ____________________________________________   PHYSICAL EXAM:  VITAL SIGNS: ED Triage Vitals  Enc Vitals Group     BP 07/18/14 1337 136/80 mmHg     Pulse Rate 07/18/14 1337 67     Resp 07/18/14 1337 20     Temp 07/18/14 1337 98.1 F (36.7 C)     Temp Source 07/18/14 1337 Oral     SpO2 07/18/14 1337 96 %     Weight 07/18/14 1337 125 lb (56.7 kg)     Height 07/18/14 1337 5\' 11"  (1.803 m)     Head Cir --      Peak Flow --      Pain Score 07/18/14 1334 9     Pain Loc --      Pain Edu? --      Excl. in [place]? --     Constitutional: Alert and oriented. Well appearing and in no acute distress. Eyes: Conjunctivae are normal. PERRL. EOMI. Head: Atraumatic. Nose: No congestion/rhinnorhea. Neck: No stridor.   Cardiovascular: Normal rate, regular rhythm. Grossly normal heart sounds.  Good peripheral circulation. Respiratory: Normal respiratory effort.  No retractions. Lungs CTAB. Gastrointestinal: Soft and nontender. No distention. No abdominal bruits. No CVA tenderness. Musculoskeletal: No lower extremity tenderness nor edema.  No joint effusions. Left hand motor sensory function intact. Patient guards left middle finger secondary to pain and  foreign body. Neurologic:  Normal speech and language. No gross focal neurologic deficits are appreciated. Speech is normal. No gait instability. Skin:  Skin is warm, dry and intact. No rash noted. There is a  single linear metallic foreign body protruding from a puncture wound left middle finger distal aspect on the volar aspect. There is no active bleeding at this time. Refill is within normal limits. Psychiatric: Mood and affect are normal. Speech and behavior are normal.  ____________________________________________   LABS (all labs ordered are listed, but only abnormal results are displayed)  Labs Reviewed - No data to display  RADIOLOGY  X-ray of the left middle finger shows a 3 mm metallic foreign body. ____________________________________________   PROCEDURES  Procedure(s) performed:  Left third finger was cleaned with normal saline. Metallic foreign body was identified and removed with splinter tweezers.  Foreign body was removed completely. There is no bleeding after this procedure. No lidocaine was used during this procedure as it was superficial to the skin.  Critical Care performed: No  ____________________________________________   INITIAL IMPRESSION / ASSESSMENT AND PLAN / ED COURSE  Pertinent labs & imaging results that were available during my care of the patient were reviewed by me and considered in my medical decision making (see chart for details).  Patient was encouraged to keep area clean and dry. He was given a TDap Injection. He is also given a prescription for Keflex to take 3 times a day for 7 days. He is return to the emergency room if any signs of infection. ____________________________________________   FINAL CLINICAL IMPRESSION(S) / ED DIAGNOSES  Final diagnoses:  Foreign body in skin of finger, initial encounter      Johnn Hai, PA-C 07/18/14 1552  Lavonia Drafts, MD 07/18/14 [phone]

## 2015-02-16 ENCOUNTER — Encounter (HOSPITAL_COMMUNITY): Payer: Self-pay | Admitting: Family Medicine

## 2015-02-16 ENCOUNTER — Emergency Department (HOSPITAL_COMMUNITY)
Admission: EM | Admit: 2015-02-16 | Discharge: 2015-02-16 | Disposition: A | Discharge status: Home / Self Care | Payer: Medicare Other | Attending: Emergency Medicine | Admitting: Emergency Medicine

## 2015-02-16 DIAGNOSIS — F17210 Nicotine dependence, cigarettes, uncomplicated: Secondary | ICD-10-CM | POA: Insufficient documentation

## 2015-02-16 DIAGNOSIS — Z79899 Other long term (current) drug therapy: Secondary | ICD-10-CM | POA: Insufficient documentation

## 2015-02-16 DIAGNOSIS — G8929 Other chronic pain: Secondary | ICD-10-CM | POA: Insufficient documentation

## 2015-02-16 DIAGNOSIS — I10 Essential (primary) hypertension: Secondary | ICD-10-CM | POA: Insufficient documentation

## 2015-02-16 DIAGNOSIS — K5090 Crohn's disease, unspecified, without complications: Secondary | ICD-10-CM | POA: Diagnosis not present

## 2015-02-16 DIAGNOSIS — R1084 Generalized abdominal pain: Secondary | ICD-10-CM

## 2015-02-16 DIAGNOSIS — R109 Unspecified abdominal pain: Secondary | ICD-10-CM | POA: Diagnosis present

## 2015-02-16 LAB — COMPREHENSIVE METABOLIC PANEL
ALK PHOS: 86 U/L (ref 38–126)
ALT: 12 U/L — AB (ref 17–63)
AST: 22 U/L (ref 15–41)
Albumin: 4 g/dL (ref 3.5–5.0)
Anion gap: 7 (ref 5–15)
BUN: 10 mg/dL (ref 6–20)
CHLORIDE: 102 mmol/L (ref 101–111)
CO2: 26 mmol/L (ref 22–32)
CREATININE: 1.06 mg/dL (ref 0.61–1.24)
Calcium: 9.3 mg/dL (ref 8.9–10.3)
GFR calc Af Amer: >60 mL/min (ref >60)
GFR calc non Af Amer: >60 mL/min (ref >60)
Glucose, Bld: 97 mg/dL (ref 65–99)
Potassium: 4.2 mmol/L (ref 3.5–5.1)
SODIUM: 135 mmol/L (ref 135–145)
Total Bilirubin: 0.8 mg/dL (ref 0.3–1.2)
Total Protein: 7.6 g/dL (ref 6.5–8.1)

## 2015-02-16 LAB — URINALYSIS, ROUTINE W REFLEX MICROSCOPIC
BILIRUBIN URINE: NEGATIVE
GLUCOSE, UA: NEGATIVE mg/dL
KETONES UR: NEGATIVE mg/dL
Leukocytes, UA: NEGATIVE
Nitrite: NEGATIVE
PROTEIN: NEGATIVE mg/dL
Specific Gravity, Urine: 1.022 (ref 1.005–1.030)
pH: 6 (ref 5.0–8.0)

## 2015-02-16 LAB — CBC
HCT: 37.5 % — ABNORMAL LOW (ref 39.0–52.0)
Hemoglobin: 13 g/dL (ref 13.0–17.0)
MCH: 33.2 pg (ref 26.0–34.0)
MCHC: 34.7 g/dL (ref 30.0–36.0)
MCV: 95.7 fL (ref 78.0–100.0)
PLATELETS: 261 10*3/uL (ref 150–400)
RBC: 3.92 MIL/uL — ABNORMAL LOW (ref 4.22–5.81)
RDW: 13 % (ref 11.5–15.5)
WBC: 7.8 10*3/uL (ref 4.0–10.5)

## 2015-02-16 LAB — LIPASE, BLOOD: LIPASE: 25 U/L (ref 11–51)

## 2015-02-16 LAB — URINE MICROSCOPIC-ADD ON: WBC, UA: NONE SEEN WBC/hpf (ref 0–5)

## 2015-02-16 MED ORDER — ONDANSETRON HCL 4 MG PO TABS: 4.0000 mg | ORAL_TABLET | Freq: Four times a day (QID) | ORAL | Status: DC

## 2015-02-16 MED ORDER — PREDNISONE 20 MG PO TABS
60.0000 mg | ORAL_TABLET | Freq: Once | ORAL | Status: AC
  Administered 2015-02-16: 60 mg via ORAL
  Filled 2015-02-16: qty 3

## 2015-02-16 MED ORDER — ONDANSETRON 4 MG PO TBDP
4.0000 mg | ORAL_TABLET | Freq: Once | ORAL | Status: AC
  Administered 2015-02-16: 4 mg via ORAL
  Filled 2015-02-16: qty 1

## 2015-02-16 MED ORDER — OXYCODONE-ACETAMINOPHEN 5-325 MG PO TABS: 1.0000 | ORAL_TABLET | Freq: Four times a day (QID) | ORAL | Status: DC | PRN

## 2015-02-16 MED ORDER — MORPHINE SULFATE (PF) 4 MG/ML IV SOLN
4.0000 mg | Freq: Once | INTRAVENOUS | Status: AC
  Administered 2015-02-16: 4 mg via INTRAMUSCULAR
  Filled 2015-02-16: qty 1

## 2015-02-16 MED ORDER — PREDNISONE 10 MG PO TABS: ORAL_TABLET | ORAL | Status: DC

## 2015-02-16 MED ORDER — DICYCLOMINE HCL 10 MG PO CAPS
20.0000 mg | ORAL_CAPSULE | Freq: Once | ORAL | Status: AC
  Administered 2015-02-16: 20 mg via ORAL
  Filled 2015-02-16: qty 2

## 2015-02-16 MED ORDER — METHYLPREDNISOLONE SODIUM SUCC 125 MG IJ SOLR: 125.0000 mg | Freq: Once | INTRAMUSCULAR | Status: DC

## 2015-02-16 MED ORDER — SODIUM CHLORIDE 0.9 % IV BOLUS (SEPSIS): 1000.0000 mL | Freq: Once | INTRAVENOUS | Status: DC

## 2015-02-16 MED ORDER — ONDANSETRON 4 MG PO TBDP
4.0000 mg | ORAL_TABLET | Freq: Once | ORAL | Status: DC
  Filled 2015-02-16: qty 1

## 2015-02-16 MED ORDER — MORPHINE SULFATE (PF) 4 MG/ML IV SOLN
4.0000 mg | Freq: Once | INTRAVENOUS | Status: DC
Start: 2015-02-16 — End: 2015-02-16

## 2015-02-16 MED ORDER — ONDANSETRON HCL 4 MG/2ML IJ SOLN: 4.0000 mg | Freq: Once | INTRAMUSCULAR | Status: DC

## 2015-02-16 MED ORDER — PANTOPRAZOLE SODIUM 20 MG PO TBEC: 20.0000 mg | DELAYED_RELEASE_TABLET | Freq: Every day | ORAL | Status: DC

## 2015-02-16 MED ORDER — PREDNISONE 20 MG PO TABS: 60.0000 mg | ORAL_TABLET | Freq: Every day | ORAL | Status: DC

## 2015-02-16 MED ORDER — OXYCODONE-ACETAMINOPHEN 5-325 MG PO TABS
1.0000 | ORAL_TABLET | Freq: Once | ORAL | Status: DC
  Filled 2015-02-16: qty 1

## 2015-02-16 MED ORDER — DICYCLOMINE HCL 20 MG PO TABS: 20.0000 mg | ORAL_TABLET | Freq: Two times a day (BID) | ORAL | Status: DC

## 2015-02-16 NOTE — ED Notes (Signed)
Pt refused PO meds for pain and nausea while waiting for IV team to start IV .

## 2015-02-16 NOTE — ED Notes (Signed)
Pt stable, ambulatory, states understanding of discharge instructions 

## 2015-02-16 NOTE — Discharge Instructions (Signed)
Schedule a follow up appointment with your GI doctor at [facility] or the local gastroenterologist (Dr. Benson Norway) for a follow up appointment.    Crohn Disease Crohn disease is a long-lasting (chronic) disease that affects your gastrointestinal (GI) tract. It often causes irritation and swelling (inflammation) in your small intestine and the beginning of your large intestine. However, it can affect any part of your GI tract. Crohn disease is part of a group of illnesses that are known as inflammatory bowel disease (IBD). Crohn disease may start slowly and get worse over time. Symptoms may come and go. They may also disappear for months or even years at a time (remission). CAUSES The exact cause of Crohn disease is not known. It may be a response that causes your body's defense system (immune system) to mistakenly attack healthy cells and tissues (autoimmune response). Your genes and your environment may also play a role. RISK FACTORS You may be at greater risk for Crohn disease if you:  Have other family members with Crohn disease or another IBD.  Use any tobacco products, including cigarettes, chewing tobacco, or electronic cigarettes.  Are in your 29s.  Have [place] European ancestry. SIGNS AND SYMPTOMS The main signs and symptoms of Crohn disease involve your GI tract. These include:  Diarrhea.  Rectal bleeding.  An urgent need to move your bowels.  The feeling that you are not finished having a bowel movement.  Abdominal pain or cramping.  Constipation. General signs and symptoms of Crohn disease may also include:  Unexplained weight loss.  Fatigue.  Fever.  Nausea.  Loss of appetite.  Joint pain  Changes in vision.  Red bumps on your skin. DIAGNOSIS Your health care provider may suspect Crohn disease based on your symptoms and your medical history. Your health care provider will do a physical exam. You may need to see a health care provider who specializes in diseases of  the digestive tract (gastroenterologist). You may also have tests to help your health care providers make a diagnosis. These may include:  Blood tests.  Stool sample tests.  Imaging tests, such as X-rays and CT scans.  Tests to examine the inside of your intestines using a long, flexible tube that has a light and a camera on the end (endoscopy or colonoscopy).  A procedure to take tissue samples from inside your bowel (biopsy) to be examined under a microscope. TREATMENT  There is no cure for Crohn disease. Treatment will focus on managing your symptoms. Crohn disease affects each person differently. Your treatment may include:  Resting your bowels. Drinking only clear liquids or getting nutrition through an IV for a period of time gives your bowels a chance to heal because they are not passing stools.  Medicines. These may be used alone or in combination (combination therapy). These may include antibiotic medicines. You may be given medicines that help to:  Reduce inflammation.  Control your immune system activity.  Fight infections.  Relieve cramps and prevent diarrhea.  Control your pain.  Surgery. You may need surgery if:  Medicines and other treatments are no longer working.  You develop complications from severe Crohn disease.  A section of your intestine becomes so damaged that it needs to be removed. HOME CARE INSTRUCTIONS  Take medicines only as directed by your health care provider.  If you were prescribed an antibiotic medicine, finish it all even if you start to feel better.  Keep all follow-up visits as directed by your health care provider. This is  important.  Talk with your health care provider about changing your diet. This may help your symptoms. Your health care provide may recommend changes, such as:  Drinking more fluids.  Avoiding milk and other foods that contain lactose.  Eating a low-fat diet.  Avoiding high-fiber foods, such as popcorn and  nuts.  Avoiding carbonated beverages, such as soda.  Eating smaller meals more often rather than eating large meals.  Keeping a food diary to identify foods that make your symptoms better or worse.  Do not use any tobacco products, including cigarettes, chewing tobacco, or electronic cigarettes. If you need help quitting, ask your health care provider.  Limit alcohol intake to no more than 1 drink per day for nonpregnant women and 2 drinks per day for men. One drink equals 12 ounces of beer, 5 ounces of wine, or 1 ounces of hard liquor.  Exercise daily or as directed by your health care provider. SEEK MEDICAL CARE IF:  You have diarrhea, abdominal cramps, and other gastrointestinal problems that are present almost all of the time.  Your symptoms do not improve with treatment.  You continue to lose weight.  You develop a rash or sores on your skin.  You develop eye problems.  You have a fever.   Your symptoms get worse.  You develop new symptoms. SEEK IMMEDIATE MEDICAL CARE IF:  You have bloody diarrhea.  You develop severe abdominal pain.  You cannot pass stools.   This information is not intended to replace advice given to you by your health care provider. Make sure you discuss any questions you have with your health care provider.   Document Released: 10/24/2004 Document Revised: 02/04/2014 Document Reviewed: 09/01/2013 Elsevier Interactive Patient Education [employer].

## 2015-02-16 NOTE — ED Provider Notes (Signed)
History  By signing my name below, I, Marlowe Kays, attest that this documentation has been prepared under the direction and in the presence of Rashi Granier, PA-C. Electronically Signed: Marlowe Kays, ED Scribe. 02/16/2015. 3:41 PM.  Chief Complaint  Patient presents with  . Abdominal Pain  . Diarrhea   The history is provided by the patient and medical records. No language interpreter was used.    HPI Comments:  Benjamin Murillo is a 45 y.o. male with PMHx of Crohn's disease who presents to the Emergency Department complaining of constant, sharp mid abdominal pain that began about three weeks ago. He reports associated appetite loss, diarrhea (4-5 episodes daily) and nausea. He reports some intermittent vomiting, diaphoresis, intermittent dizziness upon standing and dark stools. He notes that he chronically has dark, loose stools. He reports some intermittent dysuria but states that is a normal side effect from his maintenance medication, Humira. Pt states he is normally followed at [facility] for his Crohn's and was last seen there about 7-8 months ago. He states he is prescribed Humira every 14 days in which he has not missed any doses. He has not taken anything to treat his symptoms. He denies modifying factors. He denies hematemesis, hematochezia, fever, chills, flank pain, HA, syncopal episodes or any other complaints. He states this is typical of his Crohn's flares.  Past Medical History  Diagnosis Date  . Crohn disease ([place])   . Chronic neck pain   . Chronic abdominal pain   . Cervical radiculopathy   . Shortness of breath     "sometimes laying down; sometimes w/activity" (10/13/2012)  . Hypertension     no meds   . Cold     2 weeks ago with cough  still   . GERD (gastroesophageal reflux disease)   . [id number])    Past Surgical History  Procedure Laterality Date  . Bowel resection      x2  . Nevus excision      left arm + hand  . Colon surgery      drains placed to  drain cyst  . Anterior cervical decomp/discectomy fusion N/A 02/12/2013    Procedure: ANTERIOR CERVICAL DECOMPRESSION/DISCECTOMY FUSION 1 LEVEL;  Surgeon: Winfield Cunas, MD;  Location: [place] NEURO ORS;  Service: Neurosurgery;  Laterality: N/A;  C67 anterior cervical decompression with fusion plating and bonegraft   History reviewed. No pertinent family history. Social History  Substance Use Topics  . Smoking status: Current Every Day Smoker -- 0.75 packs/day for 22 years    Types: Cigarettes  . Smokeless tobacco: Never Used  . Alcohol Use: No    Review of Systems  Constitutional: Positive for diaphoresis and appetite change.  Gastrointestinal: Positive for vomiting, abdominal pain and diarrhea.  Genitourinary: Positive for dysuria.  Neurological: Positive for dizziness.  All other systems reviewed and are negative.   Allergies  Tramadol and Morphine and related  Home Medications   Prior to Admission medications   Medication Sig Start Date End Date Taking? Authorizing Provider  acetaminophen-codeine (TYLENOL #2) 300-15 MG per tablet Take 1 tablet by mouth every 4 (four) hours as needed for moderate pain. Patient not taking: Reported on 02/28/2014 01/06/14   Nita Sells, MD  budesonide (ENTOCORT EC) 3 MG 24 hr capsule Take 2 capsules (6 mg total) by mouth daily. Patient not taking: Reported on 02/28/2014 01/06/14   Nita Sells, MD  cephALEXin (KEFLEX) 500 MG capsule Take 1 capsule (500 mg total) by mouth 3 (three) times daily.  07/18/14   Johnn Hai, PA-C  dicyclomine (BENTYL) 20 MG tablet Take 1 tablet (20 mg total) by mouth every 6 (six) hours as needed for spasms (for abdominal cramping). 06/08/14   Linton Flemings, MD  dicyclomine (BENTYL) 20 MG tablet Take 1 tablet (20 mg total) by mouth 2 (two) times daily. 02/16/15   Lahoma Crocker Kadarious Dikes, PA-C  erythromycin [facility]) ophthalmic ointment Place 1 application into the right eye 4 (four) times daily. Patient not taking: Reported  on 01/03/2014 10/15/13   Courtney Forcucci, PA-C  HUMIRA PEN 40 MG/0.8ML injection Inject 40 mg as directed every 14 (fourteen) days.  03/22/13   Historical Provider, MD  metoCLOPramide (REGLAN) 10 MG tablet Take 1 tablet (10 mg total) by mouth every 6 (six) hours as needed for nausea (or headache). Patient not taking: Reported on 03/08/2014 [id number]   [name], MD  ondansetron [facility] ODT) 8 MG disintegrating tablet Take 1 tablet (8 mg total) by mouth every 8 (eight) hours as needed for nausea or vomiting. 06/08/14   Linton Flemings, MD  ondansetron (ZOFRAN) 4 MG tablet Take 1 tablet (4 mg total) by mouth every 6 (six) hours. 02/16/15   Lexia Vandevender, PA-C  oxyCODONE-acetaminophen (PERCOCET) 5-325 MG per tablet Take 1 tablet by mouth every 4 (four) hours as needed for severe pain. 06/08/14   Linton Flemings, MD  oxyCODONE-acetaminophen (PERCOCET/ROXICET) 5-325 MG tablet Take 1 tablet by mouth every 6 (six) hours as needed for severe pain. 02/16/15   Amarys Sliwinski, PA-C  pantoprazole (PROTONIX) 20 MG tablet Take 1 tablet (20 mg total) by mouth daily. 02/16/15   Michell Giuliano, PA-C  pantoprazole (PROTONIX) 40 MG tablet Take 1 tablet (40 mg total) by mouth daily. 06/08/14   Linton Flemings, MD  predniSONE (DELTASONE) 10 MG tablet Take 6 tabs for 2 days, 5 tabs for 2 days, 4 tabs for 2 days, 3 tabs for 2 days, 2 tabs for 2 days 06/08/14   Linton Flemings, MD  predniSONE (DELTASONE) 10 MG tablet Take 40 mg (two pills in morning, two in evening) for 3 days, then 30 mg (two in morning, one in evening) for 3 days, then 20 mg (one in morning, one in evening) for 3 days then 10 mg (one in morning) for 3 days 02/16/15   Lahoma Crocker Radley Barto, PA-C  promethazine (PHENERGAN) 25 MG tablet Take 1 tablet (25 mg total) by mouth every 6 (six) hours as needed for nausea or vomiting. 06/08/14   Linton Flemings, MD   Triage Vitals: BP 127/85 mmHg  Pulse 84  Temp(Src) 98.6 F (37 C) (Oral)  Resp 18  SpO2 97% Physical Exam  Constitutional: He appears  well-developed and well-nourished. No distress.  Nontoxic appearing  HENT:  Head: Normocephalic and atraumatic.  Right Ear: External ear normal.  Left Ear: External ear normal.  Eyes: Conjunctivae are normal. Right eye exhibits no discharge. Left eye exhibits no discharge. No scleral icterus.  Neck: Normal range of motion. Neck supple.  Cardiovascular: Normal rate and normal heart sounds.   Pulmonary/Chest: Effort normal and breath sounds normal.  Abdominal: Soft. Normal appearance. There is generalized tenderness. There is no rigidity, no rebound and no guarding.  Musculoskeletal: Normal range of motion.  Moves all extremities spontaneously. Walks around ED treatment room with steady gait and no lightheadedness  Neurological: He is alert. Coordination normal.  Skin: Skin is warm and dry.  Psychiatric: He has a normal mood and affect. His behavior is normal.  Nursing note and vitals reviewed.  ED Course  Procedures (including critical care time) DIAGNOSTIC STUDIES: Oxygen Saturation is 97% on RA, normal by my interpretation.   COORDINATION OF CARE: 2:08 PM- Will order IV fluids, Zofran and pain medication. Encouraged pt to follow up with GI doctor. Will give resources for pt to follow up with GI doctor locally. Pt verbalizes understanding and agrees to plan.  Medications  ondansetron (ZOFRAN-ODT) disintegrating tablet 4 mg (4 mg Oral Not Given 02/16/15 1515)  dicyclomine (BENTYL) capsule 20 mg (20 mg Oral Given 02/16/15 1619)  ondansetron (ZOFRAN-ODT) disintegrating tablet 4 mg (4 mg Oral Given 02/16/15 1712)  morphine 4 MG/ML injection 4 mg (4 mg Intramuscular Given 02/16/15 1712)  predniSONE (DELTASONE) tablet 60 mg (60 mg Oral Given 02/16/15 1828)    Labs Review Labs Reviewed  COMPREHENSIVE METABOLIC PANEL - Abnormal; Notable for the following:    ALT 12 (*)    All other components within normal limits  CBC - Abnormal; Notable for the following:    RBC 3.92 (*)    HCT 37.5  (*)    All other components within normal limits  URINALYSIS, ROUTINE W REFLEX MICROSCOPIC (NOT AT [facility]) - Abnormal; Notable for the following:    Hgb urine dipstick SMALL (*)    All other components within normal limits  URINE MICROSCOPIC-ADD ON - Abnormal; Notable for the following:    Squamous Epithelial / LPF 0-5 (*)    Bacteria, UA RARE (*)    All other components within normal limits  LIPASE, BLOOD    MDM   Final diagnoses:  Generalized abdominal pain  Crohn's disease without complication, unspecified gastrointestinal tract location [facility])   45 year old male with PMHx of Crohn's presenting with abdominal pain, nausea, vomiting and diarrhea x 3 weeks. VSS. Patient is nontoxic, nonseptic appearing, in no apparent distress. Abdomen is soft with generalized tenderness. No peritoneal signs suggesting surgical abdomen. Patient's pain and other symptoms adequately managed in emergency department. Attempted to start IV but multiple unsuccessful attempts by IV team. Labs and vitals reviewed. Blood work is unremarkable. Pt given PO medications for pain and nausea. He was able to tolerate PO intake after nausea under control and consumed multiple cups of water. Crohn's flare likely source of their symptoms. Patient does not meet the SIRS or Sepsis criteria. Repeat abdominal exam before discharge with improvement. Patient discharged home with steroid taper, nausea medicine and pain medicine and given strict instructions for follow-up with their gastroenterologst. Provided local GI doctor if patient has difficulty getting to [facility]. Also refilled pt's protonix and bentyl prescriptions. I have also discussed reasons to return immediately to the ER.  Patient expresses understanding and agrees with plan.  I personally performed the services described in this documentation, which was scribed in my presence. The recorded information has been reviewed and is accurate.     Josephina Gip, PA-C 02/16/15  1853  Dorie Rank, MD 02/16/15 2209

## 2015-02-16 NOTE — ED Notes (Signed)
Pt here for a week of abd pain, not eating and diarrhea. sts hx of same with chrons.

## 2015-03-08 ENCOUNTER — Emergency Department
Admission: EM | Admit: 2015-03-08 | Discharge: 2015-03-08 | Disposition: A | Discharge status: Home / Self Care | Payer: Medicare Other | Attending: Emergency Medicine | Admitting: Emergency Medicine

## 2015-03-08 ENCOUNTER — Encounter: Payer: Self-pay | Admitting: *Deleted

## 2015-03-08 DIAGNOSIS — S39012A Strain of muscle, fascia and tendon of lower back, initial encounter: Secondary | ICD-10-CM | POA: Insufficient documentation

## 2015-03-08 DIAGNOSIS — Z79899 Other long term (current) drug therapy: Secondary | ICD-10-CM | POA: Diagnosis not present

## 2015-03-08 DIAGNOSIS — X58XXXA Exposure to other specified factors, initial encounter: Secondary | ICD-10-CM | POA: Insufficient documentation

## 2015-03-08 DIAGNOSIS — Z792 Long term (current) use of antibiotics: Secondary | ICD-10-CM | POA: Diagnosis not present

## 2015-03-08 DIAGNOSIS — S3992XA Unspecified injury of lower back, initial encounter: Secondary | ICD-10-CM | POA: Diagnosis present

## 2015-03-08 DIAGNOSIS — Y9389 Activity, other specified: Secondary | ICD-10-CM | POA: Insufficient documentation

## 2015-03-08 DIAGNOSIS — F17210 Nicotine dependence, cigarettes, uncomplicated: Secondary | ICD-10-CM | POA: Diagnosis not present

## 2015-03-08 DIAGNOSIS — Y998 Other external cause status: Secondary | ICD-10-CM | POA: Diagnosis not present

## 2015-03-08 DIAGNOSIS — R109 Unspecified abdominal pain: Secondary | ICD-10-CM | POA: Diagnosis not present

## 2015-03-08 DIAGNOSIS — Y9289 Other specified places as the place of occurrence of the external cause: Secondary | ICD-10-CM | POA: Diagnosis not present

## 2015-03-08 DIAGNOSIS — I10 Essential (primary) hypertension: Secondary | ICD-10-CM | POA: Insufficient documentation

## 2015-03-08 MED ORDER — METHOCARBAMOL 500 MG PO TABS
1000.0000 mg | ORAL_TABLET | Freq: Once | ORAL | Status: AC
  Administered 2015-03-08: 1000 mg via ORAL
  Filled 2015-03-08: qty 2

## 2015-03-08 MED ORDER — HYDROCOD POLST-CPM POLST ER 10-8 MG/5ML PO SUER
5.0000 mL | Freq: Two times a day (BID) | ORAL | Status: DC
Start: 2015-03-08 — End: 2015-05-18

## 2015-03-08 MED ORDER — HYDROCOD POLST-CPM POLST ER 10-8 MG/5ML PO SUER
5.0000 mL | Freq: Once | ORAL | Status: AC
  Administered 2015-03-08: 5 mL via ORAL
  Filled 2015-03-08: qty 5

## 2015-03-08 MED ORDER — CARISOPRODOL 350 MG PO TABS: 350.0000 mg | ORAL_TABLET | Freq: Four times a day (QID) | ORAL | Status: DC | PRN

## 2015-03-08 NOTE — ED Notes (Addendum)
Pt states a cold for the past few days and now states lower back pain, denies any injury or urinary symptoms

## 2015-03-08 NOTE — ED Provider Notes (Signed)
[facility] Emergency Department Provider Note  ____________________________________________  Time seen: Approximately 4:08 PM  I have reviewed the triage vital signs and the nursing notes.   HISTORY  Chief Complaint Back Pain    HPI Benjamin Murillo is a 45 y.o. male patient complains of 2 days of low back pain. Patient state for the past week he has nonproductive cough. Patient denies any radicular component to his back pain. Patient denies any bladder or bowel dysfunction. No palliative measures taken for this complaint. Patient rates his pain as a 10 over 10. Pain is localized in the paraspinal muscle areas bilaterally.   Past Medical History  Diagnosis Date  . Crohn disease ([place])   . Chronic neck pain   . Chronic abdominal pain   . Cervical radiculopathy   . Shortness of breath     "sometimes laying down; sometimes w/activity" (10/13/2012)  . Hypertension     no meds   . Cold     2 weeks ago with cough  still   . GERD (gastroesophageal reflux disease)   . GBTDVVOH(607.3)     Patient Active Problem List   Diagnosis Date Noted  . AP (abdominal pain)   . Crohn disease ([place])   . Crohn's colitis ([place]) 01/03/2014  . HNP (herniated nucleus pulposus), cervical 02/12/2013  . Tobacco abuse 10/14/2012  . Noncompliance 10/14/2012  . Acute abdominal pain 10/13/2012  . Chronic abdominal pain   . Crohn's ileitis ([place]) 06/06/2012    Past Surgical History  Procedure Laterality Date  . Bowel resection      x2  . Nevus excision      left arm + hand  . Colon surgery      drains placed to drain cyst  . Anterior cervical decomp/discectomy fusion N/A 02/12/2013    Procedure: ANTERIOR CERVICAL DECOMPRESSION/DISCECTOMY FUSION 1 LEVEL;  Surgeon: Winfield Cunas, MD;  Location: [place] NEURO ORS;  Service: Neurosurgery;  Laterality: N/A;  C67 anterior cervical decompression with fusion plating and bonegraft    Current Outpatient Rx  Name  Route  Sig  Dispense   Refill  . acetaminophen-codeine (TYLENOL #2) 300-15 MG per tablet   Oral   Take 1 tablet by mouth every 4 (four) hours as needed for moderate pain. Patient not taking: Reported on 02/28/2014   30 tablet   0   . budesonide (ENTOCORT EC) 3 MG 24 hr capsule   Oral   Take 2 capsules (6 mg total) by mouth daily. Patient not taking: Reported on 02/28/2014   20 capsule   0   . carisoprodol (SOMA) 350 MG tablet   Oral   Take 1 tablet (350 mg total) by mouth 4 (four) times daily as needed for muscle spasms.   30 tablet   0   . cephALEXin (KEFLEX) 500 MG capsule   Oral   Take 1 capsule (500 mg total) by mouth 3 (three) times daily.   21 capsule   0   . chlorpheniramine-HYDROcodone (TUSSIONEX PENNKINETIC ER) 10-8 MG/5ML SUER   Oral   Take 5 mLs by mouth 2 (two) times daily.   115 mL   0   . dicyclomine (BENTYL) 20 MG tablet   Oral   Take 1 tablet (20 mg total) by mouth every 6 (six) hours as needed for spasms (for abdominal cramping).   20 tablet   0   . dicyclomine (BENTYL) 20 MG tablet   Oral   Take 1 tablet (20 mg  total) by mouth 2 (two) times daily.   20 tablet   0   . erythromycin (ROMYCIN) ophthalmic ointment   Right Eye   Place 1 application into the right eye 4 (four) times daily. Patient not taking: Reported on 01/03/2014   3.5 g   0   . HUMIRA PEN 40 MG/0.8ML injection   Injection   Inject 40 mg as directed every 14 (fourteen) days.          . metoCLOPramide (REGLAN) 10 MG tablet   Oral   Take 1 tablet (10 mg total) by mouth every 6 (six) hours as needed for nausea (or headache). Patient not taking: Reported on 03/08/2014   30 tablet   0   . ondansetron (ZOFRAN ODT) 8 MG disintegrating tablet   Oral   Take 1 tablet (8 mg total) by mouth every 8 (eight) hours as needed for nausea or vomiting.   20 tablet   0   . ondansetron (ZOFRAN) 4 MG tablet   Oral   Take 1 tablet (4 mg total) by mouth every 6 (six) hours.   12 tablet   0   .  oxyCODONE-acetaminophen (PERCOCET) 5-325 MG per tablet   Oral   Take 1 tablet by mouth every 4 (four) hours as needed for severe pain.   20 tablet   0   . oxyCODONE-acetaminophen (PERCOCET/ROXICET) 5-325 MG tablet   Oral   Take 1 tablet by mouth every 6 (six) hours as needed for severe pain.   18 tablet   0   . pantoprazole (PROTONIX) 20 MG tablet   Oral   Take 1 tablet (20 mg total) by mouth daily.   30 tablet   0   . pantoprazole (PROTONIX) 40 MG tablet   Oral   Take 1 tablet (40 mg total) by mouth daily.   30 tablet   0   . predniSONE (DELTASONE) 10 MG tablet      Take 6 tabs for 2 days, 5 tabs for 2 days, 4 tabs for 2 days, 3 tabs for 2 days, 2 tabs for 2 days   40 tablet   0   . predniSONE (DELTASONE) 10 MG tablet      Take 40 mg (two pills in morning, two in evening) for 3 days, then 30 mg (two in morning, one in evening) for 3 days, then 20 mg (one in morning, one in evening) for 3 days then 10 mg (one in morning) for 3 days   30 tablet   0   . promethazine (PHENERGAN) 25 MG tablet   Oral   Take 1 tablet (25 mg total) by mouth every 6 (six) hours as needed for nausea or vomiting.   12 tablet   0     Allergies Tramadol and Morphine and related  History reviewed. No pertinent family history.  Social History Social History  Substance Use Topics  . Smoking status: Current Every Day Smoker -- 0.75 packs/day for 22 years    Types: Cigarettes  . Smokeless tobacco: Never Used  . Alcohol Use: No    Review of Systems Constitutional: No fever/chills Eyes: No visual changes. ENT: No sore throat. Cardiovascular: Denies chest pain. Respiratory: Denies shortness of breath. Gastrointestinal: Mild abdominal pain.  No nausea, no vomiting.  No diarrhea.  No constipation. Genitourinary: Negative for dysuria. Musculoskeletal: Positive for back pain. Skin: Negative for rash. Neurological: Negative for headaches, focal weakness or  numbness.    ____________________________________________   PHYSICAL  EXAM:  VITAL SIGNS: ED Triage Vitals  Enc Vitals Group     BP 03/08/15 1546 132/91 mmHg     Pulse Rate 03/08/15 1546 74     Resp 03/08/15 1546 18     Temp 03/08/15 1546 98.4 F (36.9 C)     Temp Source 03/08/15 1546 Oral     SpO2 03/08/15 1546 97 %     Weight 03/08/15 1546 130 lb (58.968 kg)     Height 03/08/15 1546 5' 11"  (1.803 m)     Head Cir --      Peak Flow --      Pain Score 03/08/15 1547 10     Pain Loc --      Pain Edu? --      Excl. in [place]? --    Constitutional: Alert and oriented. Well appearing and in no acute distress. Eyes: Conjunctivae are normal. PERRL. EOMI. Head: Atraumatic. Nose: No congestion/rhinnorhea. Mouth/Throat: Mucous membranes are moist.  Oropharynx non-erythematous. Neck: No stridor. No cervical spine tenderness to palpation. Hematological/Lymphatic/Immunilogical: No cervical lymphadenopathy. Cardiovascular: Normal rate, regular rhythm. Grossly normal heart sounds.  Good peripheral circulation. Respiratory: Normal respiratory effort.  No retractions. Lungs CTAB. Gastrointestinal: Soft and nontender. No distention. No abdominal bruits. No CVA tenderness. Musculoskeletal: No obvious spinal deformity. Guarding palpation spinal processes. She has some moderate guarding bilateral paraspinal muscles areas. Increase paraspinal muscle spasms with flexion and extension. Patient has negative straight leg test.. Patient is in stable heavy reliance on upper extremities. Neurologic:  Normal speech and language. No gross focal neurologic deficits are appreciated. No gait instability. Skin:  Skin is warm, dry and intact. No rash noted. Psychiatric: Mood and affect are normal. Speech and behavior are normal.  ____________________________________________   LABS (all labs ordered are listed, but only abnormal results are displayed)  Labs Reviewed - No data to  display ____________________________________________  EKG   ____________________________________________  RADIOLOGY   ____________________________________________   PROCEDURES  Procedure(s) performed: None  Critical Care performed: No  ____________________________________________   INITIAL IMPRESSION / ASSESSMENT AND PLAN / ED COURSE  Pertinent labs & imaging results that were available during my care of the patient were reviewed by me and considered in my medical decision making (see chart for details).  Lumbar strain. Patient given discharge care instructions. Patient given prescription for Tussionex to control his cough and Robaxin for muscle spasms. Patient is given a school note for 2 days. Patient advised to follow-up with family doctor if complaint persists. ____________________________________________   FINAL CLINICAL IMPRESSION(S) / ED DIAGNOSES  Final diagnoses:  Strain of lumbar paraspinal muscle, initial encounter      Sable Feil, PA-C 03/08/15 1618  Harvest Dark, MD 03/08/15 2259

## 2015-03-13 ENCOUNTER — Emergency Department: Payer: Medicare Other

## 2015-03-13 ENCOUNTER — Emergency Department
Admission: EM | Admit: 2015-03-13 | Discharge: 2015-03-13 | Disposition: A | Discharge status: Home / Self Care | Payer: Medicare Other | Attending: Emergency Medicine | Admitting: Emergency Medicine

## 2015-03-13 ENCOUNTER — Encounter: Payer: Self-pay | Admitting: Emergency Medicine

## 2015-03-13 DIAGNOSIS — J9801 Acute bronchospasm: Secondary | ICD-10-CM | POA: Insufficient documentation

## 2015-03-13 DIAGNOSIS — M545 Low back pain, unspecified: Secondary | ICD-10-CM

## 2015-03-13 DIAGNOSIS — I10 Essential (primary) hypertension: Secondary | ICD-10-CM | POA: Insufficient documentation

## 2015-03-13 DIAGNOSIS — F17210 Nicotine dependence, cigarettes, uncomplicated: Secondary | ICD-10-CM | POA: Insufficient documentation

## 2015-03-13 DIAGNOSIS — J209 Acute bronchitis, unspecified: Secondary | ICD-10-CM

## 2015-03-13 DIAGNOSIS — Z79899 Other long term (current) drug therapy: Secondary | ICD-10-CM | POA: Insufficient documentation

## 2015-03-13 DIAGNOSIS — Z792 Long term (current) use of antibiotics: Secondary | ICD-10-CM | POA: Diagnosis not present

## 2015-03-13 DIAGNOSIS — Z7952 Long term (current) use of systemic steroids: Secondary | ICD-10-CM | POA: Insufficient documentation

## 2015-03-13 LAB — URINALYSIS COMPLETE WITH MICROSCOPIC (ARMC ONLY)
BACTERIA UA: NONE SEEN
Bilirubin Urine: NEGATIVE
GLUCOSE, UA: NEGATIVE mg/dL
Ketones, ur: NEGATIVE mg/dL
Leukocytes, UA: NEGATIVE
Nitrite: NEGATIVE
Protein, ur: 30 mg/dL — AB
Specific Gravity, Urine: 1.02 (ref 1.005–1.030)
pH: 6 (ref 5.0–8.0)

## 2015-03-13 MED ORDER — AZITHROMYCIN 250 MG PO TABS: ORAL_TABLET | ORAL | Status: DC

## 2015-03-13 MED ORDER — CYCLOBENZAPRINE HCL 5 MG PO TABS: 5.0000 mg | ORAL_TABLET | Freq: Three times a day (TID) | ORAL | Status: DC | PRN

## 2015-03-13 MED ORDER — BENZONATATE 100 MG PO CAPS: 100.0000 mg | ORAL_CAPSULE | Freq: Three times a day (TID) | ORAL | Status: DC | PRN

## 2015-03-13 NOTE — Discharge Instructions (Signed)
Acute Bronchitis Bronchitis is inflammation of the airways that extend from the windpipe into the lungs (bronchi). The inflammation often causes mucus to develop. This leads to a cough, which is the most common symptom of bronchitis.  In acute bronchitis, the condition usually develops suddenly and goes away over time, usually in a couple weeks. Smoking, allergies, and asthma can make bronchitis worse. Repeated episodes of bronchitis may cause further lung problems.  CAUSES Acute bronchitis is most often caused by the same virus that causes a cold. The virus can spread from person to person (contagious) through coughing, sneezing, and touching contaminated objects. SIGNS AND SYMPTOMS   Cough.   Fever.   Coughing up mucus.   Body aches.   Chest congestion.   Chills.   Shortness of breath.   Sore throat.  DIAGNOSIS  Acute bronchitis is usually diagnosed through a physical exam. Your health care provider will also ask you questions about your medical history. Tests, such as chest X-rays, are sometimes done to rule out other conditions.  TREATMENT  Acute bronchitis usually goes away in a couple weeks. Oftentimes, no medical treatment is necessary. Medicines are sometimes given for relief of fever or cough. Antibiotic medicines are usually not needed but may be prescribed in certain situations. In some cases, an inhaler may be recommended to help reduce shortness of breath and control the cough. A cool mist vaporizer may also be used to help thin bronchial secretions and make it easier to clear the chest.  HOME CARE INSTRUCTIONS  Get plenty of rest.   Drink enough fluids to keep your urine clear or pale yellow (unless you have a medical condition that requires fluid restriction). Increasing fluids may help thin your respiratory secretions (sputum) and reduce chest congestion, and it will prevent dehydration.   Take medicines only as directed by your health care provider.  If  you were prescribed an antibiotic medicine, finish it all even if you start to feel better.  Avoid smoking and secondhand smoke. Exposure to cigarette smoke or irritating chemicals will make bronchitis worse. If you are a smoker, consider using nicotine gum or skin patches to help control withdrawal symptoms. Quitting smoking will help your lungs heal faster.   Reduce the chances of another bout of acute bronchitis by washing your hands frequently, avoiding people with cold symptoms, and trying not to touch your hands to your mouth, nose, or eyes.   Keep all follow-up visits as directed by your health care provider.  SEEK MEDICAL CARE IF: Your symptoms do not improve after 1 week of treatment.  SEEK IMMEDIATE MEDICAL CARE IF:  You develop an increased fever or chills.   You have chest pain.   You have severe shortness of breath.  You have bloody sputum.   You develop dehydration.  You faint or repeatedly feel like you are going to pass out.  You develop repeated vomiting.  You develop a severe headache. MAKE SURE YOU:   Understand these instructions.  Will watch your condition.  Will get help right away if you are not doing well or get worse.   This information is not intended to replace advice given to you by your health care provider. Make sure you discuss any questions you have with your health care provider.   Document Released: 02/22/2004 Document Revised: 02/04/2014 Document Reviewed: 07/07/2012 Elsevier Interactive Patient Education 2016 Elsevier Inc.  Back Pain, Adult Back pain is very common in adults.The cause of back pain is rarely dangerous and  the pain often gets better over time.The cause of your back pain may not be known. Some common causes of back pain include:  Strain of the muscles or ligaments supporting the spine.  Wear and tear (degeneration) of the spinal disks.  Arthritis.  Direct injury to the back. For many people, back pain may  return. Since back pain is rarely dangerous, most people can learn to manage this condition on their own. HOME CARE INSTRUCTIONS Watch your back pain for any changes. The following actions may help to lessen any discomfort you are feeling:  Remain active. It is stressful on your back to sit or stand in one place for long periods of time. Do not sit, drive, or stand in one place for more than 30 minutes at a time. Take short walks on even surfaces as soon as you are able.Try to increase the length of time you walk each day.  Exercise regularly as directed by your health care provider. Exercise helps your back heal faster. It also helps avoid future injury by keeping your muscles strong and flexible.  Do not stay in bed.Resting more than 1-2 days can delay your recovery.  Pay attention to your body when you bend and lift. The most comfortable positions are those that put less stress on your recovering back. Always use proper lifting techniques, including:  Bending your knees.  Keeping the load close to your body.  Avoiding twisting.  Find a comfortable position to sleep. Use a firm mattress and lie on your side with your knees slightly bent. If you lie on your back, put a pillow under your knees.  Avoid feeling anxious or stressed.Stress increases muscle tension and can worsen back pain.It is important to recognize when you are anxious or stressed and learn ways to manage it, such as with exercise.  Take medicines only as directed by your health care provider. Over-the-counter medicines to reduce pain and inflammation are often the most helpful.Your health care provider may prescribe muscle relaxant drugs.These medicines help dull your pain so you can more quickly return to your normal activities and healthy exercise.  Apply ice to the injured area:  Put ice in a plastic bag.  Place a towel between your skin and the bag.  Leave the ice on for 20 minutes, 2-3 times a day for the  first 2-3 days. After that, ice and heat may be alternated to reduce pain and spasms.  Maintain a healthy weight. Excess weight puts extra stress on your back and makes it difficult to maintain good posture. SEEK MEDICAL CARE IF:  You have pain that is not relieved with rest or medicine.  You have increasing pain going down into the legs or buttocks.  You have pain that does not improve in one week.  You have night pain.  You lose weight.  You have a fever or chills. SEEK IMMEDIATE MEDICAL CARE IF:   You develop new bowel or bladder control problems.  You have unusual weakness or numbness in your arms or legs.  You develop nausea or vomiting.  You develop abdominal pain.  You feel faint.   This information is not intended to replace advice given to you by your health care provider. Make sure you discuss any questions you have with your health care provider.   Document Released: 01/14/2005 Document Revised: 02/04/2014 Document Reviewed: 05/18/2013 Elsevier Interactive Patient Education 2016 [place] the prescription meds as directed. Follow-up with your provider as directed. Apply moist heat  or ice to the back for comfort.

## 2015-03-13 NOTE — ED Notes (Signed)
Pt presents with complaints of lower back pain x 1 week.  Pt reports being sick with a cold last week and noticed his back started hurting.  Cold has resolved but back pain persists.  Pt denies any injury to back.  Pt ambulatory to room.

## 2015-03-13 NOTE — ED Provider Notes (Signed)
[facility] Emergency Department Provider Note ____________________________________________  Time seen: 1640  I have reviewed the triage vital signs and the nursing notes.  HISTORY  Chief Complaint  Back Pain  HPI Benjamin Murillo is a 45 y.o. male presents to the ED for complaint of low back pain for the last 2 days without injury, trauma, or accident in the interim. He was seen here last week and according to him diagnosed with the flu and noted back pain at that time. He was discharged with a prescription for Soma to dose for muscle spasm. He denies any significant benefit with that medication. He describes symmetrical lumbar pain at the sacral region without referral distally. He also denies any bladder or bowel incontinence. He denies any leg weakness, foot drop, or distal paresthesias. He has not followed up with his primary care provider since the initial visit, sighting that he was not able to secure an appointment until next week. He reports that he wait until next week to be seen for this back pain.He reports his pain at a 10/10 in triage.  Past Medical History  Diagnosis Date  . Crohn disease ([place])   . Chronic neck pain   . Chronic abdominal pain   . Cervical radiculopathy   . Shortness of breath     "sometimes laying down; sometimes w/activity" (10/13/2012)  . Hypertension     no meds   . Cold     2 weeks ago with cough  still   . GERD (gastroesophageal reflux disease)   . [id number])     Patient Active Problem List   Diagnosis Date Noted  . AP (abdominal pain)   . Crohn disease ([place])   . Crohn's colitis ([place]) 01/03/2014  . HNP (herniated nucleus pulposus), cervical 02/12/2013  . Tobacco abuse 10/14/2012  . Noncompliance 10/14/2012  . Acute abdominal pain 10/13/2012  . Chronic abdominal pain   . Crohn's ileitis ([place]) 06/06/2012    Past Surgical History  Procedure Laterality Date  . Bowel resection      x2  . Nevus excision       left arm + hand  . Colon surgery      drains placed to drain cyst  . Anterior cervical decomp/discectomy fusion N/A 02/12/2013    Procedure: ANTERIOR CERVICAL DECOMPRESSION/DISCECTOMY FUSION 1 LEVEL;  Surgeon: Winfield Cunas, MD;  Location: [facility];  Service: Neurosurgery;  Laterality: N/A;  C67 anterior cervical decompression with fusion plating and bonegraft    Current Outpatient Rx  Name  Route  Sig  Dispense  Refill  . acetaminophen-codeine (TYLENOL #2) 300-15 MG per tablet   Oral   Take 1 tablet by mouth every 4 (four) hours as needed for moderate pain. Patient not taking: Reported on 02/28/2014   30 tablet   0   . azithromycin (ZITHROMAX Z-PAK) 250 MG tablet      Take 2 tablets (500 mg) on  Day 1,  followed by 1 tablet (250 mg) once daily on Days 2 through 5.   6 each   0   . benzonatate (TESSALON PERLES) 100 MG capsule   Oral   Take 1 capsule (100 mg total) by mouth 3 (three) times daily as needed for cough (Take 1-2 per dose).   30 capsule   0   . budesonide (ENTOCORT EC) 3 MG 24 hr capsule   Oral   Take 2 capsules (6 mg total) by mouth daily. Patient not taking: Reported on 02/28/2014  20 capsule   0   . carisoprodol (SOMA) 350 MG tablet   Oral   Take 1 tablet (350 mg total) by mouth 4 (four) times daily as needed for muscle spasms.   30 tablet   0   . cephALEXin (KEFLEX) 500 MG capsule   Oral   Take 1 capsule (500 mg total) by mouth 3 (three) times daily.   21 capsule   0   . chlorpheniramine-HYDROcodone (TUSSIONEX PENNKINETIC ER) 10-8 MG/5ML SUER   Oral   Take 5 mLs by mouth 2 (two) times daily.   115 mL   0   . cyclobenzaprine (FLEXERIL) 5 MG tablet   Oral   Take 1 tablet (5 mg total) by mouth 3 (three) times daily as needed for muscle spasms.   15 tablet   0   . dicyclomine (BENTYL) 20 MG tablet   Oral   Take 1 tablet (20 mg total) by mouth every 6 (six) hours as needed for spasms (for abdominal cramping).   20 tablet   0   . dicyclomine  (BENTYL) 20 MG tablet   Oral   Take 1 tablet (20 mg total) by mouth 2 (two) times daily.   20 tablet   0   . erythromycin (ROMYCIN) ophthalmic ointment   Right Eye   Place 1 application into the right eye 4 (four) times daily. Patient not taking: Reported on 01/03/2014   3.5 g   0   . HUMIRA PEN 40 MG/0.8ML injection   Injection   Inject 40 mg as directed every 14 (fourteen) days.          . metoCLOPramide (REGLAN) 10 MG tablet   Oral   Take 1 tablet (10 mg total) by mouth every 6 (six) hours as needed for nausea (or headache). Patient not taking: Reported on 03/08/2014   30 tablet   0   . ondansetron (ZOFRAN ODT) 8 MG disintegrating tablet   Oral   Take 1 tablet (8 mg total) by mouth every 8 (eight) hours as needed for nausea or vomiting.   20 tablet   0   . ondansetron (ZOFRAN) 4 MG tablet   Oral   Take 1 tablet (4 mg total) by mouth every 6 (six) hours.   12 tablet   0   . oxyCODONE-acetaminophen (PERCOCET) 5-325 MG per tablet   Oral   Take 1 tablet by mouth every 4 (four) hours as needed for severe pain.   20 tablet   0   . oxyCODONE-acetaminophen (PERCOCET/ROXICET) 5-325 MG tablet   Oral   Take 1 tablet by mouth every 6 (six) hours as needed for severe pain.   18 tablet   0   . pantoprazole (PROTONIX) 20 MG tablet   Oral   Take 1 tablet (20 mg total) by mouth daily.   30 tablet   0   . pantoprazole (PROTONIX) 40 MG tablet   Oral   Take 1 tablet (40 mg total) by mouth daily.   30 tablet   0   . predniSONE (DELTASONE) 10 MG tablet      Take 6 tabs for 2 days, 5 tabs for 2 days, 4 tabs for 2 days, 3 tabs for 2 days, 2 tabs for 2 days   40 tablet   0   . predniSONE (DELTASONE) 10 MG tablet      Take 40 mg (two pills in morning, two in evening) for 3 days, then 30 mg (two in  morning, one in evening) for 3 days, then 20 mg (one in morning, one in evening) for 3 days then 10 mg (one in morning) for 3 days   30 tablet   0   . promethazine  (PHENERGAN) 25 MG tablet   Oral   Take 1 tablet (25 mg total) by mouth every 6 (six) hours as needed for nausea or vomiting.   12 tablet   0    Allergies Tramadol and Morphine and related  No family history on file.  Social History Social History  Substance Use Topics  . Smoking status: Current Every Day Smoker -- 0.75 packs/day for 22 years    Types: Cigarettes  . Smokeless tobacco: Never Used  . Alcohol Use: No   Review of Systems  Constitutional: Negative for fever. Cardiovascular: Negative for chest pain. Respiratory: Negative for shortness of breath. Reports continued cough. Gastrointestinal: Negative for abdominal pain, vomiting and diarrhea. Genitourinary: Negative for dysuria. Musculoskeletal: Positive for back pain. Skin: Negative for rash. Neurological: Negative for headaches, focal weakness or numbness. ____________________________________________  PHYSICAL EXAM:  VITAL SIGNS: ED Triage Vitals  Enc Vitals Group     BP 03/13/15 1622 128/90 mmHg     Pulse Rate 03/13/15 1622 56     Resp 03/13/15 1622 16     Temp 03/13/15 1622 97.8 F (36.6 C)     Temp Source 03/13/15 1622 Oral     SpO2 03/13/15 1622 100 %     Weight 03/13/15 1622 130 lb (58.968 kg)     Height 03/13/15 1622 5\' 11"  (1.803 m)     Head Cir --      Peak Flow --      Pain Score 03/13/15 1622 10     Pain Loc --      Pain Edu? --      Excl. in [place]? --    Constitutional: Alert and oriented. Well appearing and in no distress. Head: Normocephalic and atraumatic. Neck: Supple. No thyromegaly. Hematological/Lymphatic/Immunological: No cervical lymphadenopathy. Cardiovascular: Normal rate, regular rhythm.  Respiratory: Normal respiratory effort. No wheezes/rales. Mild rhonchi bilaterally. Gastrointestinal: Soft and nontender. No distention. Musculoskeletal: Patient with normal spinal alignment without midline tenderness, spasm, deformity, or step-off. He is mildly tender to palpation along the  lumbar paraspinal musculature. He has normal fluid transition from sit to stand. She is able to demonstrate normal toe and heel raise on exam. He is found have a negative supine straight leg raise. Nontender with normal range of motion in all extremities.  Neurologic: Cranial nerves II through XII grossly intact. Normal LE DTRs bilaterally. Normal gait without ataxia. Normal speech and language. No gross focal neurologic deficits are appreciated. Skin:  Skin is warm, dry and intact. No rash noted. Psychiatric: Mood and affect are normal. Patient exhibits appropriate insight and judgment. ____________________________________________   RADIOLOGY  LS Spine IMPRESSION: Negative.  I, Braxton Vantrease, Dannielle Karvonen, personally viewed and evaluated these images (plain radiographs) as part of my medical decision making, as well as reviewing the written report by the radiologist. ____________________________________________  [place] ([place]) - Abnormal; Notable for the following:    Color, Urine YELLOW (*)    APPearance CLEAR (*)    Hgb urine dipstick 1+ (*)    Protein, ur 30 (*)    Squamous Epithelial / LPF 0-5 (*)    All other components within normal limits  ____________________________________________  INITIAL IMPRESSION / ASSESSMENT AND PLAN / ED COURSE  Was notified of radiology results. He'll be discharged with a prescription for Flexeril to dose as directed for lumbar spasm. He is encouraged to follow-up with his primary care provider, as schedule, next week for ongoing medical management. He is provided with prescription for [facility] and a azithromycin for his ongoing persistent cough. He is also provided with a for cyclobenzaprine to dose for his lumbar strain. He continued to dose over-the-counter pain medicine and the formal acetaminophen. He is currently taking a prednisone taper and continue that as  prescribed. ____________________________________________  FINAL CLINICAL IMPRESSION(S) / ED DIAGNOSES  Final diagnoses:  Midline low back pain without sciatica  Bronchitis, acute, with bronchospasm      Melvenia Needles, PA-C 03/15/15 0047  Harvest Dark, MD 03/21/15 2220

## 2015-04-01 ENCOUNTER — Emergency Department: Payer: Medicare Other

## 2015-04-01 ENCOUNTER — Encounter: Payer: Self-pay | Admitting: Emergency Medicine

## 2015-04-01 ENCOUNTER — Emergency Department
Admission: EM | Admit: 2015-04-01 | Discharge: 2015-04-01 | Disposition: A | Discharge status: Home / Self Care | Payer: Medicare Other | Attending: Emergency Medicine | Admitting: Emergency Medicine

## 2015-04-01 DIAGNOSIS — S62309A Unspecified fracture of unspecified metacarpal bone, initial encounter for closed fracture: Secondary | ICD-10-CM

## 2015-04-01 DIAGNOSIS — F17210 Nicotine dependence, cigarettes, uncomplicated: Secondary | ICD-10-CM | POA: Insufficient documentation

## 2015-04-01 DIAGNOSIS — Y9389 Activity, other specified: Secondary | ICD-10-CM | POA: Diagnosis not present

## 2015-04-01 DIAGNOSIS — Z792 Long term (current) use of antibiotics: Secondary | ICD-10-CM | POA: Diagnosis not present

## 2015-04-01 DIAGNOSIS — Y998 Other external cause status: Secondary | ICD-10-CM | POA: Diagnosis not present

## 2015-04-01 DIAGNOSIS — Y9289 Other specified places as the place of occurrence of the external cause: Secondary | ICD-10-CM | POA: Diagnosis not present

## 2015-04-01 DIAGNOSIS — I10 Essential (primary) hypertension: Secondary | ICD-10-CM | POA: Insufficient documentation

## 2015-04-01 DIAGNOSIS — Z79899 Other long term (current) drug therapy: Secondary | ICD-10-CM | POA: Insufficient documentation

## 2015-04-01 DIAGNOSIS — S62325A Displaced fracture of shaft of fourth metacarpal bone, left hand, initial encounter for closed fracture: Secondary | ICD-10-CM | POA: Insufficient documentation

## 2015-04-01 DIAGNOSIS — W231XXA Caught, crushed, jammed, or pinched between stationary objects, initial encounter: Secondary | ICD-10-CM | POA: Diagnosis not present

## 2015-04-01 DIAGNOSIS — S6992XA Unspecified injury of left wrist, hand and finger(s), initial encounter: Secondary | ICD-10-CM | POA: Diagnosis present

## 2015-04-01 MED ORDER — HYDROCODONE-ACETAMINOPHEN 5-325 MG PO TABS
1.0000 | ORAL_TABLET | Freq: Four times a day (QID) | ORAL | Status: DC | PRN
Start: 2015-04-01 — End: 2015-05-18

## 2015-04-01 MED ORDER — HYDROCODONE-ACETAMINOPHEN 5-325 MG PO TABS
2.0000 | ORAL_TABLET | Freq: Once | ORAL | Status: AC
  Administered 2015-04-01: 2 via ORAL
  Filled 2015-04-01: qty 2

## 2015-04-01 NOTE — Discharge Instructions (Signed)
Metacarpal Fracture °A metacarpal fracture is a break (fracture) of a bone in the hand. Metacarpals are the bones that extend from your knuckles to your wrist. In each hand, you have five metacarpal bones that connect your fingers and your thumb to your wrist. °Some hand fractures have bone pieces that are close together and stable (simple). These fractures may be treated with only a splint or cast. Hand fractures that have many pieces of broken bone (comminuted), unstable bone pieces (displaced), or a bone that breaks through the skin (compound) usually require surgery. °CAUSES °This injury may be caused by: °· A fall. °· A hard, direct hit to your hand. °· An injury that squeezes your knuckle, stretches your finger out of place, or crushes your hand. °RISK FACTORS °This injury is more likely to occur if: °· You play contact sports. °· You have certain bone diseases. °SYMPTOMS  °Symptoms of this type of fracture develop soon after the injury. Symptoms may include: °· Swelling. °· Pain. °· Stiffness. °· Increased pain with movement. °· Bruising. °· Inability to move a finger. °· A shortened finger. °· A finger knuckle that looks sunken in. °· Unusual appearance of the hand or finger (deformity). °DIAGNOSIS  °This injury may be diagnosed based on your signs and symptoms, especially if you had a recent hand injury. Your health care provider will perform a physical exam. He or she may also order X-rays to confirm the diagnosis.  °TREATMENT  °Treatment for this injury depends on the type of fracture you have and how severe it is. Possible treatments include: °· Non-reduction. This can be done if the bone does not need to be moved back into place. The fracture can be casted or splinted as it is.   °· Closed reduction. If your bone is stable and can be moved back into place, you may only need to wear a cast or splint or have buddy taping. °· Closed reduction with internal fixation (CRIF). This is the most common  treatment. You may have this procedure if your bone can be moved back into place but needs more support. Wires, pins, or screws may be inserted through your skin to stabilize the fracture. °· Open reduction with internal fixation (ORIF). This may be needed if your fracture is severe and unstable. It involves surgery to move your bone back into the right position. Screws, wires, or plates are used to stabilize the fracture. °After all procedures, you may need to wear a cast or a splint for several weeks. You will also need to have follow-up X-rays to make sure that the bone is healing well and staying in position. After you no longer need your cast or splint, you may need physical therapy. This will help you to regain full movement and strength in your hand.  °HOME CARE INSTRUCTIONS  °If You Have a Cast: °· Do not stick anything inside the cast to scratch your skin. Doing that increases your risk of infection. °· Check the skin around the cast every day. Report any concerns to your health care provider. You may put lotion on dry skin around the edges of the cast. Do not apply lotion to the skin underneath the cast. °If You Have a Splint: °· Wear it as directed by your health care provider. Remove it only as directed by your health care provider. °· Loosen the splint if your fingers become numb and tingle, or if they turn cold and blue. °Bathing °· Cover the cast or splint with a   watertight plastic bag to protect it from water while you take a bath or a shower. Do not let the cast or splint get wet. °Managing Pain, Stiffness, and Swelling °· If directed, apply ice to the injured area (if you have a splint, not a cast): °¨ Put ice in a plastic bag. °¨ Place a towel between your skin and the bag. °¨ Leave the ice on for 20 minutes, 2-3 times a day. °· Move your fingers often to avoid stiffness and to lessen swelling. °· Raise the injured area above the level of your heart while you are sitting or lying  down. °Driving °· Do not drive or operate heavy machinery while taking pain medicine. °· Do not drive while wearing a cast or splint on a hand that you use for driving. °Activity °· Return to your normal activities as directed by your health care provider. Ask your health care provider what activities are safe for you. °General Instructions °· Do not put pressure on any part of the cast or splint until it is fully hardened. This may take several hours. °· Keep the cast or splint clean and dry. °· Do not use any tobacco products, including cigarettes, chewing tobacco, or electronic cigarettes. Tobacco can delay bone healing. If you need help quitting, ask your health care provider. °· Take medicines only as directed by your health care provider. °· Keep all follow-up visits as directed by your health care provider. This is important. °SEEK MEDICAL CARE IF:  °· Your pain is getting worse. °· You have redness, swelling, or pain in the injured area.   °· You have fluid, blood, or pus coming from under your cast or splint.   °· You notice a bad smell coming from under your cast or splint.   °· You have a fever.   °SEEK IMMEDIATE MEDICAL CARE IF:  °· You develop a rash.   °· You have trouble breathing.   °· Your skin or nails on your injured hand turn blue or gray even after you loosen your splint. °· Your injured hand feels cold or becomes numb even after you loosen your splint.   °· You develop severe pain under the cast or in your hand. °  °This information is not intended to replace advice given to you by your health care provider. Make sure you discuss any questions you have with your health care provider. °  °Document Released: 01/14/2005 Document Revised: 10/05/2014 Document Reviewed: 11/03/2013 °Elsevier Interactive Patient Education ©2016 [employer] Inc[employer] ° °

## 2015-04-01 NOTE — ED Notes (Signed)
Pt reports having Left hand on door jam when the door was shut on it.  Pt reports 10/10 pain.

## 2015-04-01 NOTE — ED Notes (Signed)
Shut hand in house door approx 10 am. Hand swollen.

## 2015-04-01 NOTE — ED Provider Notes (Signed)
[facility] Emergency Department Provider Note  ____________________________________________  Time seen: Approximately 1:50 PM  I have reviewed the triage vital signs and the nursing notes.   HISTORY  Chief Complaint Hand Injury    HPI Benjamin Murillo is a 45 y.o. male , NAD, presents emergency department with left hand pain after it was shut in a house door earlier this morning. States significant swelling to the area. Notes pain to be a 10 out of 10. Denies numbness, weakness, tingling. No open wounds or lacerations.   Past Medical History  Diagnosis Date  . Crohn disease ([place])   . Chronic neck pain   . Chronic abdominal pain   . Cervical radiculopathy   . Shortness of breath     "sometimes laying down; sometimes w/activity" (10/13/2012)  . Hypertension     no meds   . Cold     2 weeks ago with cough  still   . GERD (gastroesophageal reflux disease)   . [id number])     Patient Active Problem List   Diagnosis Date Noted  . AP (abdominal pain)   . Crohn disease ([place])   . Crohn's colitis ([place]) 01/03/2014  . HNP (herniated nucleus pulposus), cervical 02/12/2013  . Tobacco abuse 10/14/2012  . Noncompliance 10/14/2012  . Acute abdominal pain 10/13/2012  . Chronic abdominal pain   . Crohn's ileitis ([place]) 06/06/2012    Past Surgical History  Procedure Laterality Date  . Bowel resection      x2  . Nevus excision      left arm + hand  . Colon surgery      drains placed to drain cyst  . Anterior cervical decomp/discectomy fusion N/A 02/12/2013    Procedure: ANTERIOR CERVICAL DECOMPRESSION/DISCECTOMY FUSION 1 LEVEL;  Surgeon: Winfield Cunas, MD;  Location: [place] [facility];  Service: Neurosurgery;  Laterality: N/A;  C67 anterior cervical decompression with fusion plating and bonegraft    Current Outpatient Rx  Name  Route  Sig  Dispense  Refill  . azithromycin (ZITHROMAX Z-PAK) 250 MG tablet      Take 2 tablets (500 mg) on  Day 1,  followed  by 1 tablet (250 mg) once daily on Days 2 through 5.   6 each   0   . benzonatate (TESSALON PERLES) 100 MG capsule   Oral   Take 1 capsule (100 mg total) by mouth 3 (three) times daily as needed for cough (Take 1-2 per dose).   30 capsule   0   . budesonide (ENTOCORT EC) 3 MG 24 hr capsule   Oral   Take 2 capsules (6 mg total) by mouth daily. Patient not taking: Reported on 02/28/2014   20 capsule   0   . carisoprodol (SOMA) 350 MG tablet   Oral   Take 1 tablet (350 mg total) by mouth 4 (four) times daily as needed for muscle spasms.   30 tablet   0   . cephALEXin (KEFLEX) 500 MG capsule   Oral   Take 1 capsule (500 mg total) by mouth 3 (three) times daily.   21 capsule   0   . chlorpheniramine-HYDROcodone (TUSSIONEX PENNKINETIC ER) 10-8 MG/5ML SUER   Oral   Take 5 mLs by mouth 2 (two) times daily.   115 mL   0   . cyclobenzaprine (FLEXERIL) 5 MG tablet   Oral   Take 1 tablet (5 mg total) by mouth 3 (three) times daily as needed for muscle spasms.  15 tablet   0   . dicyclomine (BENTYL) 20 MG tablet   Oral   Take 1 tablet (20 mg total) by mouth every 6 (six) hours as needed for spasms (for abdominal cramping).   20 tablet   0   . dicyclomine (BENTYL) 20 MG tablet   Oral   Take 1 tablet (20 mg total) by mouth 2 (two) times daily.   20 tablet   0   . erythromycin (ROMYCIN) ophthalmic ointment   Right Eye   Place 1 application into the right eye 4 (four) times daily. Patient not taking: Reported on 01/03/2014   3.5 g   0   . HUMIRA PEN 40 MG/0.8ML injection   Injection   Inject 40 mg as directed every 14 (fourteen) days.          Marland Kitchen HYDROcodone-acetaminophen (NORCO) 5-325 MG tablet   Oral   Take 1-2 tablets by mouth every 6 (six) hours as needed for severe pain.   20 tablet   0   . metoCLOPramide (REGLAN) 10 MG tablet   Oral   Take 1 tablet (10 mg total) by mouth every 6 (six) hours as needed for nausea (or headache). Patient not taking: Reported  on 03/08/2014   30 tablet   0   . ondansetron (ZOFRAN ODT) 8 MG disintegrating tablet   Oral   Take 1 tablet (8 mg total) by mouth every 8 (eight) hours as needed for nausea or vomiting.   20 tablet   0   . ondansetron (ZOFRAN) 4 MG tablet   Oral   Take 1 tablet (4 mg total) by mouth every 6 (six) hours.   12 tablet   0   . pantoprazole (PROTONIX) 20 MG tablet   Oral   Take 1 tablet (20 mg total) by mouth daily.   30 tablet   0   . pantoprazole (PROTONIX) 40 MG tablet   Oral   Take 1 tablet (40 mg total) by mouth daily.   30 tablet   0   . promethazine (PHENERGAN) 25 MG tablet   Oral   Take 1 tablet (25 mg total) by mouth every 6 (six) hours as needed for nausea or vomiting.   12 tablet   0     Allergies Tramadol and Morphine and related  No family history on file.  Social History Social History  Substance Use Topics  . Smoking status: Current Every Day Smoker -- 0.10 packs/day for 22 years    Types: Cigarettes  . Smokeless tobacco: Never Used  . Alcohol Use: No     Review of Systems  Constitutional: No fever/chills Cardiovascular: No chest pain. Respiratory:  No shortness of breath. No wheezing.  Gastrointestinal: No abdominal pain.  No nausea, vomiting.  Musculoskeletal: Positive left hand pain. Skin: Positive swelling about of left hand. Negative for rash. Neurological: Negative for headaches, focal weakness or numbness. 10-point ROS otherwise negative.  ____________________________________________   PHYSICAL EXAM:  VITAL SIGNS: ED Triage Vitals  Enc Vitals Group     BP 04/01/15 1258 148/94 mmHg     Pulse Rate 04/01/15 1258 85     Resp 04/01/15 1258 18     Temp 04/01/15 1258 98.6 F (37 C)     Temp Source 04/01/15 1258 Oral     SpO2 04/01/15 1258 97 %     Weight 04/01/15 1258 130 lb (58.968 kg)     Height 04/01/15 1258 5\' 11"  (1.803 m)  Head Cir --      Peak Flow --      Pain Score 04/01/15 1300 10     Pain Loc --      Pain Edu? --       Excl. in [place]? --     Constitutional: Alert and oriented. Well appearing and in no acute distress. Eyes: Conjunctivae are normal.  Head: Atraumatic. Cardiovascular: Good peripheral circulation. Respiratory: Normal respiratory effort without tachypnea or retractions.  Musculoskeletal: Tenderness to palpation about the left fourth and fifth metacarpals with worsening pain about the proximal portion of the metacarpals. Pain with palpation of the MTP of the left fourth finger but no laxity. Decreased range of motion of all fingers due to pain. Soft tissue swelling noted at the proximal left fourth and fifth metacarpal area.  No joint effusions. Neurologic:  Normal speech and language. No gross focal neurologic deficits are appreciated.  Skin:  Skin is warm, dry and intact. Psychiatric: Mood and affect are normal. Speech and behavior are normal. Patient exhibits appropriate insight and judgement.   ____________________________________________   LABS  None  ____________________________________________  EKG  None ____________________________________________  RADIOLOGY I have personally viewed and evaluated these images (plain radiographs) as part of my medical decision making, as well as reviewing the written report by the radiologist.  Dg Hand Complete Left  04/01/2015  CLINICAL DATA:  Initial encounter for Slammed heavy wooden door on left hand today, tender 4th MC and left ring finger EXAM: LEFT HAND - COMPLETE 3+ VIEW COMPARISON:  07/18/2014 FINDINGS: Minimally displaced, oblique fracture involving the proximal shaft of the fourth metacarpal. No intra-articular extension. IMPRESSION: Fourth metacarpal fracture. Electronically Signed   By: Abigail Miyamoto M.D.   On: 04/01/2015 13:21    ____________________________________________    PROCEDURES  Procedure(s) performed: None    Medications  HYDROcodone-acetaminophen (NORCO/VICODIN) 5-325 MG per tablet 2 tablet (2 tablets Oral  Given 04/01/15 1357)   ----------------------------------------- 2:40 PM on 04/01/2015 -----------------------------------------  Patient noted significant pain reduction after administration of pain medication.  ____________________________________________   INITIAL IMPRESSION / ASSESSMENT AND PLAN / ED COURSE  Pertinent imaging results that were available during my care of the patient were reviewed by me and considered in my medical decision making (see chart for details).  Patient's diagnosis is consistent with closed fracture of the left fourth metacarpal. Sugar tong splint applied to the left hand without any immediate adverse effects. Patient will be discharged home with prescriptions for Norco 5-325 mg tablets to take one to 2 tablets every 6 hours as needed for severe pain. Patient is to follow up with Dr. Mack Guise in orthopedics early next week for follow-up. Patient given a note that he may return to work after follow-up appointment with Dr. Mack Guise. Patient is given ED precautions to return to the ED for any worsening or new symptoms.    ____________________________________________  FINAL CLINICAL IMPRESSION(S) / ED DIAGNOSES  Final diagnoses:  Metacarpal bone fracture, closed, initial encounter      NEW MEDICATIONS STARTED DURING THIS VISIT:  New Prescriptions   HYDROCODONE-ACETAMINOPHEN (NORCO) 5-325 MG TABLET    Take 1-2 tablets by mouth every 6 (six) hours as needed for severe pain.         Braxton Feathers, PA-C 04/01/15 [place], PA-C 04/01/15 1442  Carrie Mew, MD 04/01/15 1555

## 2015-04-01 NOTE — ED Notes (Signed)
Pt states he has taken hydrocodone with no complications or side effect.

## 2015-05-18 ENCOUNTER — Ambulatory Visit: Payer: Medicare Other | Attending: Anesthesiology | Admitting: Anesthesiology

## 2015-05-18 ENCOUNTER — Encounter: Payer: Self-pay | Admitting: Anesthesiology

## 2015-05-18 VITALS — BP 149/91 | HR 82 | Temp 98.6°F | Resp 16 | Ht 71.0 in | Wt 130.0 lb

## 2015-05-18 DIAGNOSIS — F17210 Nicotine dependence, cigarettes, uncomplicated: Secondary | ICD-10-CM | POA: Insufficient documentation

## 2015-05-18 DIAGNOSIS — G8929 Other chronic pain: Secondary | ICD-10-CM

## 2015-05-18 DIAGNOSIS — M545 Low back pain, unspecified: Secondary | ICD-10-CM

## 2015-05-18 DIAGNOSIS — F1290 Cannabis use, unspecified, uncomplicated: Secondary | ICD-10-CM | POA: Insufficient documentation

## 2015-05-18 DIAGNOSIS — Z981 Arthrodesis status: Secondary | ICD-10-CM | POA: Insufficient documentation

## 2015-05-18 DIAGNOSIS — M5412 Radiculopathy, cervical region: Secondary | ICD-10-CM | POA: Diagnosis not present

## 2015-05-18 DIAGNOSIS — I10 Essential (primary) hypertension: Secondary | ICD-10-CM | POA: Diagnosis not present

## 2015-05-18 DIAGNOSIS — M542 Cervicalgia: Secondary | ICD-10-CM

## 2015-05-18 DIAGNOSIS — R109 Unspecified abdominal pain: Secondary | ICD-10-CM | POA: Diagnosis present

## 2015-05-18 DIAGNOSIS — K219 Gastro-esophageal reflux disease without esophagitis: Secondary | ICD-10-CM | POA: Insufficient documentation

## 2015-05-18 DIAGNOSIS — R200 Anesthesia of skin: Secondary | ICD-10-CM | POA: Insufficient documentation

## 2015-05-18 DIAGNOSIS — R1084 Generalized abdominal pain: Secondary | ICD-10-CM | POA: Diagnosis not present

## 2015-05-18 DIAGNOSIS — Z9049 Acquired absence of other specified parts of digestive tract: Secondary | ICD-10-CM | POA: Insufficient documentation

## 2015-05-18 DIAGNOSIS — M549 Dorsalgia, unspecified: Secondary | ICD-10-CM

## 2015-05-18 DIAGNOSIS — K5090 Crohn's disease, unspecified, without complications: Secondary | ICD-10-CM | POA: Insufficient documentation

## 2015-05-18 NOTE — Patient Instructions (Signed)
Naproxen over the counter 1 tablet five times per week is recommended at this time. Low back exercises per handout is recommended.

## 2015-05-19 ENCOUNTER — Ambulatory Visit: Payer: Medicare Other | Admitting: Anesthesiology

## 2015-05-19 NOTE — Progress Notes (Signed)
Subjective:  Patient ID: Benjamin Murillo, male    DOB: Aug 29, 1970  Age: 45 y.o. MRN: [id number]  CC: Joint Pain; Neck Pain; and Abdominal Pain   HPI Benjamin Murillo presents for a new patient evaluation. He has a long-standing history of diffuse body pain and abdominal pain. He also has chronic low back pain. In addition to previous neck surgery with a 2 level fusion and chronic neck pain. He describes a pain in his neck and abdomen generally up around a VAS of 10 and a minimum of 8. The pain appears to be present throughout the day morning and afternoon and night. Aggravating factors include bending bowel movements eating kneeling lifting sitting standing squatting stooping in regards to low back and worse with walking. Nothing really gives him much relief. He has daytime cramping in the abdomen with troubles controlling his bowel function and associated nausea. He has associated numbness occasionally affecting the hands. He has chronic low back pain centralized nature with no radiation to the lower extremities. His lower extremity strength and function is without complaint. The pain he describes as sharp shooting splitting and throbbing type pain with associated abdominal pain of a cramping type.  History Benjamin Murillo has a past medical history of Crohn disease ([place]); Chronic neck pain; Chronic abdominal pain; Cervical radiculopathy; Shortness of breath; Hypertension; Cold; GERD (gastroesophageal reflux disease); Headache(784.0); and Hand fracture, left.   He has past surgical history that includes Bowel resection; Nevus excision; Colon surgery; and Anterior cervical decomp/discectomy fusion (N/A, 02/12/2013).   His family history includes Cancer in his maternal aunt; Diabetes in his paternal aunt; Hyperlipidemia in his mother.He reports that he has been smoking Cigarettes.  He has a 2.2 pack-year smoking history. He has never used smokeless tobacco. He reports that he uses illicit drugs (Marijuana).  He reports that he does not drink alcohol.   ---------------------------------------------------------------------------------------------------------------------- Past Medical History  Diagnosis Date  . Crohn disease ([place])   . Chronic neck pain   . Chronic abdominal pain   . Cervical radiculopathy   . Shortness of breath     "sometimes laying down; sometimes w/activity" (10/13/2012)  . Hypertension     no meds   . Cold     2 weeks ago with cough  still   . GERD (gastroesophageal reflux disease)   . Headache(784.0)   . Hand fracture, left     Past Surgical History  Procedure Laterality Date  . Bowel resection      x2  . Nevus excision      left arm + hand  . Colon surgery      drains placed to drain cyst  . Anterior cervical decomp/discectomy fusion N/A 02/12/2013    Procedure: ANTERIOR CERVICAL DECOMPRESSION/DISCECTOMY FUSION 1 LEVEL;  Surgeon: Winfield Cunas, MD;  Location: [place] [facility];  Service: Neurosurgery;  Laterality: N/A;  C67 anterior cervical decompression with fusion plating and bonegraft    Family History  Problem Relation Age of Onset  . Hyperlipidemia Mother   . Cancer Maternal Aunt   . Diabetes Paternal Aunt     Social History  Substance Use Topics  . Smoking status: Current Every Day Smoker -- 0.10 packs/day for 22 years    Types: Cigarettes  . Smokeless tobacco: Never Used  . Alcohol Use: No    ---------------------------------------------------------------------------------------------------------------------- Social History   Social History  . Marital Status: Single    Spouse Name: N/A  . Number of Children: N/A  . Years of Education:  N/A   Social History Main Topics  . Smoking status: Current Every Day Smoker -- 0.10 packs/day for 22 years    Types: Cigarettes  . Smokeless tobacco: Never Used  . Alcohol Use: No  . Drug Use: Yes    Special: Marijuana     Comment: 10/13/2012 "last used marijuana ~ 1 wk ago"  . Sexual Activity: Not  Currently   Other Topics Concern  . None   Social History Narrative      ----------------------------------------------------------------------------------------------------------------------  ROS Review of Systems    Objective:  BP 149/91 mmHg  Pulse 82  Temp(Src) 98.6 F (37 C) (Oral)  Resp 16  Ht 5' 11"  (1.803 m)  Wt 130 lb (58.968 kg)  BMI 18.14 kg/m2  SpO2 100%  Physical Exam  Patient is good historian cooperative and compliant Pupils are equally round reactive to light extraocular muscles are intact Inspection neck reveals good range of motion with a well-healed cervical scar and no erythema or edema. His range of motion in the glenohumeral joints in upper extremities is intact with good muscle strength apparently Inspection of the abdomen reveals some mild tenderness in the right upper and lower quadrant negative on the left artery or rebound tenderness is noted He ambulates without difficulty was some midline lumbar paraspinous muscle tenderness but no overt trigger points and minimal pain with extension at the left and right rotation.    Assessment & Plan:   Benjamin Murillo was seen today for joint pain, neck pain and abdominal pain.  Diagnoses and all orders for this visit:  Generalized abdominal pain -     ToxASSURE Select 13 (MW), Urine  Bilateral low back pain without sciatica -     ToxASSURE Select 13 (MW), Urine  Chronic neck pain -     ToxASSURE Select 13 (MW), Urine  Chronic neck and back pain -     ToxASSURE Select 13 (MW), Urine     ----------------------------------------------------------------------------------------------------------------------  Problem List Items Addressed This Visit      Other   AP (abdominal pain) - Primary   Relevant Orders   ToxASSURE Select 13 (MW), Urine    Other Visit Diagnoses    Bilateral low back pain without sciatica        Relevant Orders    ToxASSURE Select 13 (MW), Urine    Chronic neck pain         Relevant Orders    ToxASSURE Select 13 (MW), Urine    Chronic neck and back pain        Relevant Orders    ToxASSURE Select 13 (MW), Urine       ----------------------------------------------------------------------------------------------------------------------  1. Generalized abdominal pain Is taking Percocet tablets 10 mg twice a day for good relief and denies any diverting or illicit use with these medicines. We will check a tox is sure today. He states that in the past he was released at a follow clinic for positive marijuana and he doesn't foremost that this test could be positive today. I informed him that he will need to maintain strict compliance with the clinic policy. He is to continue follow-up with his GI doctors - ToxASSURE Select 13 (MW), Urine  2. Bilateral low back pain without sciatica He may be a candidate for a facet block - ToxASSURE Select 13 (MW), Urine  3. Chronic neck pain Continue follow-up with his with [facility] as indicated - ToxASSURE Select 13 (MW), Urine  4. Chronic neck and back pain We may start him on  Percocet for chronic pain management is also instructed to use NSAIDs such as Naprosyn 242m  twice a day 5 days per week, prn - ToxASSURE Select 13 (MW), Urine    ----------------------------------------------------------------------------------------------------------------------  I have discontinued Mr. MRoamerythromycin, budesonide, metoCLOPramide, dicyclomine, promethazine, ondansetron, pantoprazole, cephALEXin, pantoprazole, ondansetron, dicyclomine, chlorpheniramine-HYDROcodone, carisoprodol, benzonatate, azithromycin, cyclobenzaprine, and HYDROcodone-acetaminophen. I am also having him maintain his HUMIRA PEN.   No orders of the defined types were placed in this encounter.       Follow-up: Return in about 3 weeks (around 06/08/2015) for evaluation.    JMolli Barrows MD

## 2015-05-26 LAB — TOXASSURE SELECT 13 (MW), URINE: PDF: 0

## 2015-06-07 ENCOUNTER — Encounter: Payer: Self-pay | Admitting: Anesthesiology

## 2015-06-07 ENCOUNTER — Ambulatory Visit: Payer: Medicare Other | Attending: Anesthesiology | Admitting: Anesthesiology

## 2015-06-07 VITALS — BP 155/91 | HR 80 | Temp 98.4°F | Resp 16 | Wt 130.0 lb

## 2015-06-07 DIAGNOSIS — M549 Dorsalgia, unspecified: Secondary | ICD-10-CM

## 2015-06-07 DIAGNOSIS — R1084 Generalized abdominal pain: Secondary | ICD-10-CM | POA: Diagnosis not present

## 2015-06-07 DIAGNOSIS — G8929 Other chronic pain: Secondary | ICD-10-CM | POA: Insufficient documentation

## 2015-06-07 DIAGNOSIS — R109 Unspecified abdominal pain: Secondary | ICD-10-CM | POA: Diagnosis present

## 2015-06-07 DIAGNOSIS — F1290 Cannabis use, unspecified, uncomplicated: Secondary | ICD-10-CM | POA: Diagnosis not present

## 2015-06-07 DIAGNOSIS — I10 Essential (primary) hypertension: Secondary | ICD-10-CM | POA: Insufficient documentation

## 2015-06-07 DIAGNOSIS — Z9049 Acquired absence of other specified parts of digestive tract: Secondary | ICD-10-CM | POA: Insufficient documentation

## 2015-06-07 DIAGNOSIS — K5090 Crohn's disease, unspecified, without complications: Secondary | ICD-10-CM | POA: Diagnosis not present

## 2015-06-07 DIAGNOSIS — M5412 Radiculopathy, cervical region: Secondary | ICD-10-CM | POA: Insufficient documentation

## 2015-06-07 DIAGNOSIS — M545 Low back pain, unspecified: Secondary | ICD-10-CM

## 2015-06-07 DIAGNOSIS — M542 Cervicalgia: Secondary | ICD-10-CM | POA: Diagnosis not present

## 2015-06-07 DIAGNOSIS — R51 Headache: Secondary | ICD-10-CM | POA: Diagnosis not present

## 2015-06-07 DIAGNOSIS — K219 Gastro-esophageal reflux disease without esophagitis: Secondary | ICD-10-CM | POA: Diagnosis not present

## 2015-06-07 DIAGNOSIS — F17210 Nicotine dependence, cigarettes, uncomplicated: Secondary | ICD-10-CM | POA: Insufficient documentation

## 2015-06-07 DIAGNOSIS — R0602 Shortness of breath: Secondary | ICD-10-CM | POA: Insufficient documentation

## 2015-06-07 DIAGNOSIS — Z981 Arthrodesis status: Secondary | ICD-10-CM | POA: Insufficient documentation

## 2015-06-07 MED ORDER — OXYCODONE-ACETAMINOPHEN 10-325 MG PO TABS: 1.0000 | ORAL_TABLET | Freq: Two times a day (BID) | ORAL | Status: DC

## 2015-06-07 NOTE — Progress Notes (Signed)
Subjective:  Patient ID: Benjamin Murillo, male    DOB: 27-Jun-1970  Age: 45 y.o. MRN: [id number]  CC: Abdominal Pain and Neck Pain  Procedure: None  HPI DORRIAN DOGGETT presents for a repeat evaluation. He has a long-standing history of diffuse body pain and abdominal pain. He also has chronic low back pain. In addition to previous neck surgery with a 2 level fusion and chronic neck pain. He describes a pain in his neck and abdomen generally up around a VAS of 10 and a minimum of 8. The pain appears to be present throughout the day morning and afternoon and night. Aggravating factors include bending bowel movements eating kneeling lifting sitting standing squatting stooping in regards to low back and worse with walking. Nothing really gives him much relief. He has daytime cramping in the abdomen with troubles controlling his bowel function and associated nausea. He has associated numbness occasionally affecting the hands. He has chronic low back pain centralized nature with no radiation to the lower extremities. His lower extremity strength and function is without complaint. The pain he describes as sharp shooting splitting and throbbing type pain with associated abdominal pain of a cramping type.  No significant changes are noted on symptom exam since his last visit. His tox screen is negative.  History Symir has a past medical history of Crohn disease ([place]); Chronic neck pain; Chronic abdominal pain; Cervical radiculopathy; Shortness of breath; Hypertension; Cold; GERD (gastroesophageal reflux disease); Headache(784.0); and Hand fracture, left.   He has past surgical history that includes Bowel resection; Nevus excision; Colon surgery; and Anterior cervical decomp/discectomy fusion (N/A, 02/12/2013).   His family history includes Cancer in his maternal aunt; Diabetes in his paternal aunt; Hyperlipidemia in his mother.He reports that he has been smoking Cigarettes.  He has a 2.2 pack-year  smoking history. He has never used smokeless tobacco. He reports that he uses illicit drugs (Marijuana). He reports that he does not drink alcohol.   ---------------------------------------------------------------------------------------------------------------------- Past Medical History  Diagnosis Date  . Crohn disease ([place])   . Chronic neck pain   . Chronic abdominal pain   . Cervical radiculopathy   . Shortness of breath     "sometimes laying down; sometimes w/activity" (10/13/2012)  . Hypertension     no meds   . Cold     2 weeks ago with cough  still   . GERD (gastroesophageal reflux disease)   . Headache(784.0)   . Hand fracture, left     Past Surgical History  Procedure Laterality Date  . Bowel resection      x2  . Nevus excision      left arm + hand  . Colon surgery      drains placed to drain cyst  . Anterior cervical decomp/discectomy fusion N/A 02/12/2013    Procedure: ANTERIOR CERVICAL DECOMPRESSION/DISCECTOMY FUSION 1 LEVEL;  Surgeon: Winfield Cunas, MD;  Location: [facility];  Service: Neurosurgery;  Laterality: N/A;  C67 anterior cervical decompression with fusion plating and bonegraft    Family History  Problem Relation Age of Onset  . Hyperlipidemia Mother   . Cancer Maternal Aunt   . Diabetes Paternal Aunt     Social History  Substance Use Topics  . Smoking status: Current Every Day Smoker -- 0.10 packs/day for 22 years    Types: Cigarettes  . Smokeless tobacco: Never Used  . Alcohol Use: No    ---------------------------------------------------------------------------------------------------------------------- Social History   Social History  . Marital Status: Single  Spouse Name: N/A  . Number of Children: N/A  . Years of Education: N/A   Social History Main Topics  . Smoking status: Current Every Day Smoker -- 0.10 packs/day for 22 years    Types: Cigarettes  . Smokeless tobacco: Never Used  . Alcohol Use: No  . Drug Use: Yes     Special: Marijuana     Comment: 10/13/2012 "last used marijuana ~ 1 wk ago"  . Sexual Activity: Not Currently   Other Topics Concern  . None   Social History Narrative      ----------------------------------------------------------------------------------------------------------------------  ROS Review of Systems    Objective:  BP 155/91 mmHg  Pulse 80  Temp(Src) 98.4 F (36.9 C) (Oral)  Resp 16  Wt 130 lb (58.968 kg)  SpO2 100%  Physical Exam  Patient is good historian cooperative and compliant Pupils are equally round reactive to light extraocular muscles are intact Inspection neck reveals good range of motion with a well-healed cervical scar and no erythema or edema. His range of motion in the glenohumeral joints in upper extremities is intact with good muscle strength apparently Inspection of the abdomen reveals some mild tenderness in the right upper and lower quadrant negative on the left artery or rebound tenderness is noted He ambulates without difficulty was some midline lumbar paraspinous muscle tenderness but no overt trigger points and minimal pain with extension at the left and right rotation.    Assessment & Plan:   Rajiv was seen today for abdominal pain and neck pain.  Diagnoses and all orders for this visit:  Generalized abdominal pain -     ToxASSURE Select 13 (MW), Urine; Future  Bilateral low back pain without sciatica -     ToxASSURE Select 13 (MW), Urine; Future  Chronic neck pain -     ToxASSURE Select 13 (MW), Urine; Future  Chronic neck and back pain -     ToxASSURE Select 13 (MW), Urine; Future  Other orders -     oxyCODONE-acetaminophen (PERCOCET) 10-325 MG tablet; Take 1 tablet by mouth 2 (two) times daily with a meal.     ----------------------------------------------------------------------------------------------------------------------  Problem List Items Addressed This Visit      Other   AP (abdominal pain) - Primary    Relevant Orders   ToxASSURE Select 13 (MW), Urine    Other Visit Diagnoses    Bilateral low back pain without sciatica        Relevant Medications    oxyCODONE-acetaminophen (PERCOCET) 10-325 MG tablet    Other Relevant Orders    ToxASSURE Select 13 (MW), Urine    Chronic neck pain        Relevant Medications    oxyCODONE-acetaminophen (PERCOCET) 10-325 MG tablet    Other Relevant Orders    ToxASSURE Select 13 (MW), Urine    Chronic neck and back pain        Relevant Medications    oxyCODONE-acetaminophen (PERCOCET) 10-325 MG tablet    Other Relevant Orders    ToxASSURE Select 13 (MW), Urine       ----------------------------------------------------------------------------------------------------------------------  1. Generalized abdominal pain Is taking Percocet tablets 10 mg twice a day for good relief and denies any diverting or illicit use with these medicines. We will start him on Percocet 10 mg twice a day when necessary for pain today. His tox screen shows positive for marijuana metabolites and he has been counseled regarding this and the necessity to terminate marijuana use for which she voices understanding.Marland Kitchen He states that in  the past he was released at a follow clinic for positive marijuana and he does mention that this test could be positive today. I informed him that he will need to maintain strict compliance with the clinic policy. He is to continue follow-up with his GI doctors - ToxASSURE Select 13 (MW), Urine  2. Bilateral low back pain without sciatica He may be a candidate for a facet block - ToxASSURE Select 13 (MW), Urine  3. Chronic neck pain Continue follow-up with his with neck surgeon as indicated - ToxASSURE Select 13 (MW), Urine  4. Chronic neck and back pain We may start him on Percocet for chronic pain management is also instructed to use NSAIDs such as Naprosyn 231m  twice a day 5 days per week if okay with his GI doctors, prn - ToxASSURE Select  13 (MW), Urine    ----------------------------------------------------------------------------------------------------------------------  I am having Mr. MStorliestart on oxyCODONE-acetaminophen. I am also having him maintain his HUMIRA PEN.   Meds ordered this encounter  Medications  . oxyCODONE-acetaminophen (PERCOCET) 10-325 MG tablet    Sig: Take 1 tablet by mouth 2 (two) times daily with a meal.    Dispense:  60 tablet    Refill:  0       Follow-up: Return in about 1 month (around 07/08/2015) for evaluation.    JMolli Barrows MD

## 2015-06-07 NOTE — Patient Instructions (Signed)
Epidural Steroid Injection Patient Information  Description: The epidural space surrounds the nerves as they exit the spinal cord.  In some patients, the nerves can be compressed and inflamed by a bulging disc or a tight spinal canal (spinal stenosis).  By injecting steroids into the epidural space, we can bring irritated nerves into direct contact with a potentially helpful medication.  These steroids act directly on the irritated nerves and can reduce swelling and inflammation which often leads to decreased pain.  Epidural steroids may be injected anywhere along the spine and from the neck to the low back depending upon the location of your pain.   After numbing the skin with local anesthetic (like Novocaine), a small needle is passed into the epidural space slowly.  You may experience a sensation of pressure while this is being done.  The entire block usually last less than 10 minutes.  Conditions which may be treated by epidural steroids:   Low back and leg pain  Neck and arm pain  Spinal stenosis  Post-laminectomy syndrome  Herpes zoster (shingles) pain  Pain from compression fractures  Preparation for the injection:  1. Do not eat any solid food or dairy products within 8 hours of your appointment.  2. You may drink clear liquids up to 3 hours before appointment.  Clear liquids include water, black coffee, juice or soda.  No milk or cream please. 3. You may take your regular medication, including pain medications, with a sip of water before your appointment  Diabetics should hold regular insulin (if taken separately) and take 1/2 normal NPH dos the morning of the procedure.  Carry some sugar containing items with you to your appointment. 4. A driver must accompany you and be prepared to drive you home after your procedure.  5. Bring all your current medications with your. 6. An IV may be inserted and sedation may be given at the discretion of the physician.   7. A blood pressure  cuff, EKG and other monitors will often be applied during the procedure.  Some patients may need to have extra oxygen administered for a short period. 8. You will be asked to provide medical information, including your allergies, prior to the procedure.  We must know immediately if you are taking blood thinners (like Coumadin/Warfarin)  Or if you are allergic to IV iodine contrast (dye). We must know if you could possible be pregnant.  Possible side-effects:  Bleeding from needle site  Infection (rare, may require surgery)  Nerve injury (rare)  Numbness & tingling (temporary)  Difficulty urinating (rare, temporary)  Spinal headache ( a headache worse with upright posture)  Light -headedness (temporary)  Pain at injection site (several days)  Decreased blood pressure (temporary)  Weakness in arm/leg (temporary)  Pressure sensation in back/neck (temporary)  Call if you experience:  Fever/chills associated with headache or increased back/neck pain.  Headache worsened by an upright position.  New onset weakness or numbness of an extremity below the injection site  Hives or difficulty breathing (go to the emergency room)  Inflammation or drainage at the infection site  Severe back/neck pain  Any new symptoms which are concerning to you  Please note:  Although the local anesthetic injected can often make your back or neck feel good for several hours after the injection, the pain will likely return.  It takes 3-7 days for steroids to work in the epidural space.  You may not notice any pain relief for at least that one week.    If effective, we will often do a series of three injections spaced 3-6 weeks apart to maximally decrease your pain.  After the initial series, we generally will wait several months before considering a repeat injection of the same type.  If you have any questions, please call [phone] [facility] 

## 2015-06-07 NOTE — Progress Notes (Signed)
Safety precautions to be maintained throughout the outpatient stay will include: orient to surroundings, keep bed in low position, maintain call bell within reach at all times, provide assistance with transfer out of bed and ambulation.  

## 2015-07-04 ENCOUNTER — Emergency Department: Payer: Medicare Other

## 2015-07-04 ENCOUNTER — Encounter: Payer: Self-pay | Admitting: Emergency Medicine

## 2015-07-04 ENCOUNTER — Emergency Department
Admission: EM | Admit: 2015-07-04 | Discharge: 2015-07-04 | Disposition: A | Discharge status: Home / Self Care | Payer: Medicare Other | Attending: Emergency Medicine | Admitting: Emergency Medicine

## 2015-07-04 DIAGNOSIS — W010XXA Fall on same level from slipping, tripping and stumbling without subsequent striking against object, initial encounter: Secondary | ICD-10-CM | POA: Insufficient documentation

## 2015-07-04 DIAGNOSIS — M5412 Radiculopathy, cervical region: Secondary | ICD-10-CM | POA: Diagnosis not present

## 2015-07-04 DIAGNOSIS — S62305A Unspecified fracture of fourth metacarpal bone, left hand, initial encounter for closed fracture: Secondary | ICD-10-CM

## 2015-07-04 DIAGNOSIS — Y999 Unspecified external cause status: Secondary | ICD-10-CM | POA: Insufficient documentation

## 2015-07-04 DIAGNOSIS — F17210 Nicotine dependence, cigarettes, uncomplicated: Secondary | ICD-10-CM | POA: Insufficient documentation

## 2015-07-04 DIAGNOSIS — Y92002 Bathroom of unspecified non-institutional (private) residence single-family (private) house as the place of occurrence of the external cause: Secondary | ICD-10-CM | POA: Insufficient documentation

## 2015-07-04 DIAGNOSIS — S62397A Other fracture of fifth metacarpal bone, left hand, initial encounter for closed fracture: Secondary | ICD-10-CM | POA: Diagnosis not present

## 2015-07-04 DIAGNOSIS — I10 Essential (primary) hypertension: Secondary | ICD-10-CM | POA: Insufficient documentation

## 2015-07-04 DIAGNOSIS — S6992XA Unspecified injury of left wrist, hand and finger(s), initial encounter: Secondary | ICD-10-CM | POA: Diagnosis present

## 2015-07-04 DIAGNOSIS — Y939 Activity, unspecified: Secondary | ICD-10-CM | POA: Insufficient documentation

## 2015-07-04 MED ORDER — IBUPROFEN 800 MG PO TABS: 800.0000 mg | ORAL_TABLET | Freq: Three times a day (TID) | ORAL | Status: DC | PRN

## 2015-07-04 NOTE — ED Notes (Signed)
See triage note, +pulse in left wrist and good ROM in left hand, with the exception of fourth digit as described in triage note.

## 2015-07-04 NOTE — ED Provider Notes (Signed)
[facility] Emergency Department Provider Note   ____________________________________________  Time seen: Approximately 8:40 AM  I have reviewed the triage vital signs and the nursing notes.   HISTORY  Chief Complaint Hand Pain   HPI Benjamin Murillo is a 45 y.o. male is here with complaint of left hand pain. Patient states that he fell getting out of the bath tub on Sunday and has continued to have pain in his left hand since. Patient reports that he had a fracture in the same area possibly 6 weeks ago was being seen by an orthopedist but is unsure who he was seen in [place]. Patient has increased pain with range of motion. Patient denies any head injury or loss of consciousness. Currently he rates his pain as a 10 over 10.   Past Medical History  Diagnosis Date  . Crohn disease ([place])   . Chronic neck pain   . Chronic abdominal pain   . Cervical radiculopathy   . Shortness of breath     "sometimes laying down; sometimes w/activity" (10/13/2012)  . Hypertension     no meds   . Cold     2 weeks ago with cough  still   . GERD (gastroesophageal reflux disease)   . Headache(784.0)   . Hand fracture, left     Patient Active Problem List   Diagnosis Date Noted  . AP (abdominal pain)   . Crohn disease ([place])   . Crohn's colitis ([place]) 01/03/2014  . HNP (herniated nucleus pulposus), cervical 02/12/2013  . Tobacco abuse 10/14/2012  . Noncompliance 10/14/2012  . Acute abdominal pain 10/13/2012  . Chronic abdominal pain   . Crohn's ileitis ([place]) 06/06/2012    Past Surgical History  Procedure Laterality Date  . Bowel resection      x2  . Nevus excision      left arm + hand  . Colon surgery      drains placed to drain cyst  . Anterior cervical decomp/discectomy fusion N/A 02/12/2013    Procedure: ANTERIOR CERVICAL DECOMPRESSION/DISCECTOMY FUSION 1 LEVEL;  Surgeon: Winfield Cunas, MD;  Location: [facility];  Service: Neurosurgery;  Laterality:  N/A;  C67 anterior cervical decompression with fusion plating and bonegraft    Current Outpatient Rx  Name  Route  Sig  Dispense  Refill  . HUMIRA PEN 40 MG/0.8ML injection   Injection   Inject 40 mg as directed every 14 (fourteen) days.          Marland Kitchen ibuprofen (ADVIL,MOTRIN) 800 MG tablet   Oral   Take 1 tablet (800 mg total) by mouth every 8 (eight) hours as needed.   30 tablet   0   . oxyCODONE-acetaminophen (PERCOCET) 10-325 MG tablet   Oral   Take 1 tablet by mouth 2 (two) times daily with a meal.   60 tablet   0     Allergies Vicodin; Tramadol; and Morphine and related  Family History  Problem Relation Age of Onset  . Hyperlipidemia Mother   . Cancer Maternal Aunt   . Diabetes Paternal Aunt     Social History Social History  Substance Use Topics  . Smoking status: Current Every Day Smoker -- 0.10 packs/day for 22 years    Types: Cigarettes  . Smokeless tobacco: Never Used  . Alcohol Use: No    Review of Systems Constitutional: No fever/chills ENT: No Trauma. Cardiovascular: Denies chest pain. Respiratory: Denies shortness of breath. Gastrointestinal:   No nausea, no vomiting.  Musculoskeletal: Negative for back pain. Negative for neck pain. Positive for left hand pain. Skin: Negative for rash. Neurological: Negative for headaches, focal weakness or numbness.  10-point ROS otherwise negative.  ____________________________________________   PHYSICAL EXAM:  VITAL SIGNS: ED Triage Vitals  Enc Vitals Group     BP --      Pulse --      Resp --      Temp --      Temp src --      SpO2 --      Weight --      Height --      Head Cir --      Peak Flow --      Pain Score 07/04/15 0834 10     Pain Loc --      Pain Edu? --      Excl. in [place]? --     Constitutional: Alert and oriented. Well appearing and in no acute distress. Eyes: Conjunctivae are normal. PERRL. EOMI. Head: Atraumatic. Nose: No congestion/rhinnorhea. Neck: No stridor.  No  cervical spine tenderness on palpation posteriorly. Cardiovascular: Normal rate, regular rhythm. Grossly normal heart sounds.  Good peripheral circulation. Respiratory: Normal respiratory effort.  No retractions. Lungs CTAB. Gastrointestinal: Soft and nontender. No distention.  Musculoskeletal: On examination of the left hand there is minimal soft tissue edema present. There is tenderness on palpation of the metacarpals of the left hand on the lateral aspect over the fourth and fifth. There is no gross deformity noted. Capillary refill fourth and fifth digits less than 3 seconds. Skin is intact without ecchymosis, abrasions or erythema. No sensory function intact. Neurologic:  Normal speech and language. No gross focal neurologic deficits are appreciated. No gait instability. Skin:  Skin is warm, dry and intact. No rash noted. No ecchymosis, erythema or abrasions are noted. Psychiatric: Mood and affect are normal. Speech and behavior are normal.  ____________________________________________   LABS (all labs ordered are listed, but only abnormal results are displayed)  Labs Reviewed - No data to display  RADIOLOGY  Left hand per radiologist shows minimal healing displaced oblique fracture of the fourth metacarpal with fracture lines still evident. I, Johnn Hai, personally viewed and evaluated these images (plain radiographs) as part of my medical decision making, as well as reviewing the written report by the radiologist. ____________________________________________   PROCEDURES  Procedure(s) performed: None  Critical Care performed: No  ____________________________________________   INITIAL IMPRESSION / ASSESSMENT AND PLAN / ED COURSE  Pertinent labs & imaging results that were available during my care of the patient were reviewed by me and considered in my medical decision making (see chart for details).  Patient already sees a doctor for chronic pain and has been  prescribed Percocet. Patient has seen Dr. Mack Guise for his fracture left hand in the past. He is encouraged to make another appointment with Dr. Mack Guise. He was placed in a ulnar gutter splint. He is encouraged to ice and elevate and was given a prescription for ibuprofen as needed for pain and inflammation. ____________________________________________   FINAL CLINICAL IMPRESSION(S) / ED DIAGNOSES  Final diagnoses:  Fracture of fourth metacarpal bone of left hand, closed, initial encounter      NEW MEDICATIONS STARTED DURING THIS VISIT:  Discharge Medication List as of 07/04/2015  9:28 AM    START taking these medications   Details  ibuprofen (ADVIL,MOTRIN) 800 MG tablet Take 1 tablet (800 mg total) by mouth every 8 (eight) hours as needed.,  Starting 07/04/2015, Until Discontinued, Print         Note:  This document was prepared using Dragon voice recognition software and may include unintentional dictation errors.    Johnn Hai, PA-C 07/04/15 [id number]  Lisa Roca, MD 07/04/15 [phone]

## 2015-07-04 NOTE — ED Notes (Signed)
Pt to ed with c/o left hand fourth digit pain after slipping and falling in bathroom on Sunday.  Pt reports re ently fractured same finger about 6 weeks ago.  Pt reports increased pain in palm of hand of pt.

## 2015-07-04 NOTE — Discharge Instructions (Signed)
You will need  to follow-up with Dr. Mack Guise. Ice and elevate your hand as needed for swelling and pain reduction. Ibuprofen 800 mg 3 times a day with food.

## 2015-07-06 ENCOUNTER — Encounter: Payer: Self-pay | Admitting: Anesthesiology

## 2015-07-06 ENCOUNTER — Ambulatory Visit: Payer: Medicare Other | Attending: Anesthesiology | Admitting: Anesthesiology

## 2015-07-06 VITALS — BP 151/88 | HR 74 | Temp 97.9°F | Resp 16 | Ht 71.0 in | Wt 135.0 lb

## 2015-07-06 DIAGNOSIS — R1084 Generalized abdominal pain: Secondary | ICD-10-CM

## 2015-07-06 DIAGNOSIS — M542 Cervicalgia: Secondary | ICD-10-CM

## 2015-07-06 DIAGNOSIS — Z8781 Personal history of (healed) traumatic fracture: Secondary | ICD-10-CM | POA: Insufficient documentation

## 2015-07-06 DIAGNOSIS — Z87891 Personal history of nicotine dependence: Secondary | ICD-10-CM | POA: Diagnosis not present

## 2015-07-06 DIAGNOSIS — M79643 Pain in unspecified hand: Secondary | ICD-10-CM | POA: Diagnosis present

## 2015-07-06 DIAGNOSIS — M549 Dorsalgia, unspecified: Secondary | ICD-10-CM

## 2015-07-06 DIAGNOSIS — I10 Essential (primary) hypertension: Secondary | ICD-10-CM | POA: Diagnosis not present

## 2015-07-06 DIAGNOSIS — M545 Low back pain, unspecified: Secondary | ICD-10-CM

## 2015-07-06 DIAGNOSIS — R51 Headache: Secondary | ICD-10-CM | POA: Diagnosis not present

## 2015-07-06 DIAGNOSIS — K219 Gastro-esophageal reflux disease without esophagitis: Secondary | ICD-10-CM | POA: Insufficient documentation

## 2015-07-06 DIAGNOSIS — G8929 Other chronic pain: Secondary | ICD-10-CM

## 2015-07-06 DIAGNOSIS — K5090 Crohn's disease, unspecified, without complications: Secondary | ICD-10-CM | POA: Diagnosis not present

## 2015-07-06 DIAGNOSIS — R109 Unspecified abdominal pain: Secondary | ICD-10-CM | POA: Diagnosis present

## 2015-07-06 MED ORDER — OXYCODONE-ACETAMINOPHEN 10-325 MG PO TABS: 1.0000 | ORAL_TABLET | Freq: Two times a day (BID) | ORAL | Status: DC

## 2015-07-06 NOTE — Progress Notes (Signed)
Subjective:  Patient ID: Benjamin Murillo, male    DOB: [birth year]  Age: 45 y.o. MRN: [id number]  CC: Abdominal Pain and Hand Pain  Procedure: None  HPI JASPREET ABAIR presents for a repeat evaluation. He has a long-standing history of diffuse body pain and abdominal pain. He also has chronic low back pain. In addition to previous neck surgery with a 2 level fusion and chronic neck pain. He describes a pain in his neck and abdomen generally up around a VAS of 10 and a minimum of 8. No significant changes are noted on symptom exam since his last visit. His tox screen is negative.He has been doing well with his medications without significant change noted in the quality characteristic or distribution of his pain. He maintains that he is taking his medications as prescribed with no diverting or illicit use and based on his narcotic assessment sheet he is doing well presently.  History Kaikea has a past medical history of Crohn disease ([place]); Chronic neck pain; Chronic abdominal pain; Cervical radiculopathy; Shortness of breath; Hypertension; Cold; GERD (gastroesophageal reflux disease); Headache(784.0); and Hand fracture, left.   He has past surgical history that includes Bowel resection; Nevus excision; Colon surgery; and Anterior cervical decomp/discectomy fusion (N/A, 02/12/2013).   His family history includes Cancer in his maternal aunt; Diabetes in his paternal aunt; Hyperlipidemia in his mother.He reports that he quit smoking about 4 weeks ago. His smoking use included Cigarettes. He has a 2.2 pack-year smoking history. He has never used smokeless tobacco. He reports that he does not drink alcohol or use illicit drugs.   ---------------------------------------------------------------------------------------------------------------------- Past Medical History  Diagnosis Date  . Crohn disease ([place])   . Chronic neck pain   . Chronic abdominal pain   . Cervical radiculopathy   . Shortness  of breath     "sometimes laying down; sometimes w/activity" (10/13/2012)  . Hypertension     no meds   . Cold     2 weeks ago with cough  still   . GERD (gastroesophageal reflux disease)   . Headache(784.0)   . Hand fracture, left     Past Surgical History  Procedure Laterality Date  . Bowel resection      x2  . Nevus excision      left arm + hand  . Colon surgery      drains placed to drain cyst  . Anterior cervical decomp/discectomy fusion N/A 02/12/2013    Procedure: ANTERIOR CERVICAL DECOMPRESSION/DISCECTOMY FUSION 1 LEVEL;  Surgeon: Winfield Cunas, MD;  Location: [facility];  Service: Neurosurgery;  Laterality: N/A;  C67 anterior cervical decompression with fusion plating and bonegraft    Family History  Problem Relation Age of Onset  . Hyperlipidemia Mother   . Cancer Maternal Aunt   . Diabetes Paternal Aunt     Social History  Substance Use Topics  . Smoking status: Former Smoker -- 0.10 packs/day for 22 years    Types: Cigarettes    Quit date: 06/05/2015  . Smokeless tobacco: Never Used     Comment: fMJ  06/05/15- trying to get out of smokes  . Alcohol Use: No    ---------------------------------------------------------------------------------------------------------------------- Social History   Social History  . Marital Status: Single    Spouse Name: N/A  . Number of Children: N/A  . Years of Education: N/A   Social History Main Topics  . Smoking status: Former Smoker -- 0.10 packs/day for 22 years    Types: Cigarettes  Quit date: 06/05/2015  . Smokeless tobacco: Never Used     Comment: fMJ  06/05/15- trying to get out of smokes  . Alcohol Use: No  . Drug Use: No  . Sexual Activity: Not Currently     Comment: stopped smoking MJ 06/05/15   Other Topics Concern  . None   Social History Narrative      ----------------------------------------------------------------------------------------------------------------------  ROS Review of Systems     Objective:  BP 151/88 mmHg  Pulse 74  Temp(Src) 97.9 F (36.6 C) (Oral)  Resp 16  Ht 5\' 11"  (1.803 m)  Wt 135 lb (61.236 kg)  BMI 18.84 kg/m2  SpO2 100%  Physical Exam  Patient is good historian cooperative and compliant Pupils are equally round reactive to light extraocular muscles are intact Inspection neck reveals good range of motion with a well-healed cervical scar and no erythema or edema. His range of motion in the glenohumeral joints in upper extremities is intact with good muscle strength apparently Inspection of the abdomen reveals some mild tenderness in the right upper and lower quadrant negative on the left artery or rebound tenderness is noted He ambulates without difficulty was some midline lumbar paraspinous muscle tenderness but no overt trigger points and minimal pain with extension at the left and right rotation.    Assessment & Plan:   Rashadd was seen today for abdominal pain and hand pain.  Diagnoses and all orders for this visit:  Generalized abdominal pain -     ToxASSURE Select 13 (MW), Urine  Bilateral low back pain without sciatica -     ToxASSURE Select 13 (MW), Urine  Chronic neck pain -     ToxASSURE Select 13 (MW), Urine  Chronic neck and back pain -     ToxASSURE Select 13 (MW), Urine  Other orders -     oxyCODONE-acetaminophen (PERCOCET) 10-325 MG tablet; Take 1 tablet by mouth 2 (two) times daily with a meal.     ----------------------------------------------------------------------------------------------------------------------  Problem List Items Addressed This Visit      Other   AP (abdominal pain) - Primary   Relevant Orders   ToxASSURE Select 13 (MW), Urine    Other Visit Diagnoses    Bilateral low back pain without sciatica        Relevant Medications    oxyCODONE-acetaminophen (PERCOCET) 10-325 MG tablet    Other Relevant Orders    ToxASSURE Select 13 (MW), Urine    Chronic neck pain        Relevant  Medications    oxyCODONE-acetaminophen (PERCOCET) 10-325 MG tablet    Other Relevant Orders    ToxASSURE Select 13 (MW), Urine    Chronic neck and back pain        Relevant Medications    oxyCODONE-acetaminophen (PERCOCET) 10-325 MG tablet    Other Relevant Orders    ToxASSURE Select 13 (MW), Urine       ----------------------------------------------------------------------------------------------------------------------  1. Generalized abdominal pain Is taking Percocet tablets 10 mg twice a day for good relief and denies any diverting or illicit use with these medicines. We will refill his medications for the next month with return to clinic in 1 month He is to continue follow-up with his GI doctors - ToxASSURE Select 13 (MW), Urine  2. Bilateral low back pain without sciatica He may be a candidate for a facet block - ToxASSURE Select 13 (MW), Urine  3. Chronic neck pain Continue follow-up with his with neck surgeon as indicated - ToxASSURE Select 13 (MW),  Urine  4. Chronic neck and back pain We may start him on Percocet for chronic pain management is also instructed to use NSAIDs such as Naprosyn 220mg   twice a day 5 days per week if okay with his GI doctors, prn - ToxASSURE Select 13 (MW), Urine    ----------------------------------------------------------------------------------------------------------------------  I am having Mr. Bartolotta maintain his HUMIRA PEN, ibuprofen, and oxyCODONE-acetaminophen.   Meds ordered this encounter  Medications  . oxyCODONE-acetaminophen (PERCOCET) 10-325 MG tablet    Sig: Take 1 tablet by mouth 2 (two) times daily with a meal.    Dispense:  60 tablet    Refill:  0    Do not fill until [id number]       Follow-up: Return in about 1 month (around 08/05/2015) for evaluation, med refill.    [name], MD

## 2015-07-06 NOTE — Progress Notes (Signed)
Safety precautions to be maintained throughout the outpatient stay will include: orient to surroundings, keep bed in low position, maintain call bell within reach at all times, provide assistance with transfer out of bed and ambulation.  

## 2015-07-06 NOTE — Progress Notes (Signed)
Safety precautions to be maintained throughout the outpatient stay will include: orient to surroundings, keep bed in low position, maintain call bell within reach at all times, provide assistance with transfer out of bed and ambulation.  Pt states he is not smoking cigs or MJ since last appt

## 2015-07-11 ENCOUNTER — Ambulatory Visit: Payer: Medicare Other | Attending: Orthopedic Surgery | Admitting: Occupational Therapy

## 2015-07-14 LAB — TOXASSURE SELECT 13 (MW), URINE: PDF: 0

## 2015-08-07 ENCOUNTER — Emergency Department: Payer: Medicare Other

## 2015-08-07 ENCOUNTER — Emergency Department
Admission: EM | Admit: 2015-08-07 | Discharge: 2015-08-07 | Disposition: A | Discharge status: Home / Self Care | Payer: Medicare Other | Attending: Emergency Medicine | Admitting: Emergency Medicine

## 2015-08-07 ENCOUNTER — Encounter: Payer: Self-pay | Admitting: *Deleted

## 2015-08-07 DIAGNOSIS — Z5321 Procedure and treatment not carried out due to patient leaving prior to being seen by health care provider: Secondary | ICD-10-CM | POA: Diagnosis not present

## 2015-08-07 DIAGNOSIS — R079 Chest pain, unspecified: Secondary | ICD-10-CM | POA: Insufficient documentation

## 2015-08-07 LAB — CBC
HEMATOCRIT: 38.2 % — AB (ref 40.0–52.0)
HEMOGLOBIN: 13.6 g/dL (ref 13.0–18.0)
MCH: 34 pg (ref 26.0–34.0)
MCHC: 35.6 g/dL (ref 32.0–36.0)
MCV: 95.4 fL (ref 80.0–100.0)
Platelets: 276 10*3/uL (ref 150–440)
RBC: 4 MIL/uL — AB (ref 4.40–5.90)
RDW: 13 % (ref 11.5–14.5)
WBC: 10.5 10*3/uL (ref 3.8–10.6)

## 2015-08-07 LAB — BASIC METABOLIC PANEL
ANION GAP: 9 (ref 5–15)
BUN: 11 mg/dL (ref 6–20)
CHLORIDE: 103 mmol/L (ref 101–111)
CO2: 21 mmol/L — ABNORMAL LOW (ref 22–32)
Calcium: 9 mg/dL (ref 8.9–10.3)
Creatinine, Ser: 0.93 mg/dL (ref 0.61–1.24)
Glucose, Bld: 93 mg/dL (ref 65–99)
POTASSIUM: 4 mmol/L (ref 3.5–5.1)
SODIUM: 133 mmol/L — AB (ref 135–145)

## 2015-08-07 LAB — TROPONIN I: Troponin I: <0.03 ng/mL (ref <0.03)

## 2015-08-07 NOTE — ED Notes (Signed)
States he woke up this AM with chest pain, squeezing, denies any SOB, states nausea, pt rubbing chest and sitting forward during assessement

## 2015-08-07 NOTE — ED Notes (Signed)
Patient called x 4 for room, not in waiting room or in front of ED.

## 2015-08-07 NOTE — ED Notes (Signed)
Called 3 times in lobby, no response

## 2015-08-09 ENCOUNTER — Encounter: Payer: Self-pay | Admitting: Anesthesiology

## 2015-08-09 ENCOUNTER — Ambulatory Visit: Payer: Medicare Other | Attending: Anesthesiology | Admitting: Anesthesiology

## 2015-08-09 VITALS — BP 135/74 | HR 67 | Temp 98.3°F | Resp 18 | Ht 71.0 in | Wt 130.0 lb

## 2015-08-09 DIAGNOSIS — M542 Cervicalgia: Secondary | ICD-10-CM | POA: Insufficient documentation

## 2015-08-09 DIAGNOSIS — K219 Gastro-esophageal reflux disease without esophagitis: Secondary | ICD-10-CM | POA: Diagnosis not present

## 2015-08-09 DIAGNOSIS — M5412 Radiculopathy, cervical region: Secondary | ICD-10-CM | POA: Diagnosis not present

## 2015-08-09 DIAGNOSIS — G8929 Other chronic pain: Secondary | ICD-10-CM | POA: Insufficient documentation

## 2015-08-09 DIAGNOSIS — I10 Essential (primary) hypertension: Secondary | ICD-10-CM | POA: Diagnosis not present

## 2015-08-09 DIAGNOSIS — R1084 Generalized abdominal pain: Secondary | ICD-10-CM | POA: Diagnosis not present

## 2015-08-09 DIAGNOSIS — Z9049 Acquired absence of other specified parts of digestive tract: Secondary | ICD-10-CM | POA: Insufficient documentation

## 2015-08-09 DIAGNOSIS — M545 Low back pain, unspecified: Secondary | ICD-10-CM

## 2015-08-09 DIAGNOSIS — K5090 Crohn's disease, unspecified, without complications: Secondary | ICD-10-CM | POA: Diagnosis not present

## 2015-08-09 DIAGNOSIS — Z87891 Personal history of nicotine dependence: Secondary | ICD-10-CM | POA: Insufficient documentation

## 2015-08-09 DIAGNOSIS — Z9889 Other specified postprocedural states: Secondary | ICD-10-CM | POA: Diagnosis not present

## 2015-08-09 DIAGNOSIS — R109 Unspecified abdominal pain: Secondary | ICD-10-CM | POA: Diagnosis present

## 2015-08-09 MED ORDER — OXYCODONE-ACETAMINOPHEN 10-325 MG PO TABS: 1.0000 | ORAL_TABLET | Freq: Two times a day (BID) | ORAL | Status: DC

## 2015-08-09 NOTE — Progress Notes (Signed)
Subjective:  Patient ID: Benjamin Murillo, male    DOB: [birth year]  Age: 45 y.o. MRN: [id number]  CC: Abdominal Pain  Procedure: None  HPI MARTIN CHAUHAN presents for a repeat evaluation. He has a long-standing history of diffuse body pain and abdominal pain. He also has chronic low back pain. In addition to previous neck surgery with a 2 level fusion and chronic neck pain. He describes a pain in his neck and abdomen generally up around a VAS of 10 and a minimum of 8. No significant changes are noted on symptom exam since his last visit. His tox screen is negative.He has been doing well with his medications without significant change noted in the quality characteristic or distribution of his pain. He maintains that he is taking his medications as prescribed with no diverting or illicit use and based on his narcotic assessment sheet he is doing well presently.  History Arafat has a past medical history of Crohn disease ([place]); Chronic neck pain; Chronic abdominal pain; Cervical radiculopathy; Shortness of breath; Hypertension; Cold; GERD (gastroesophageal reflux disease); Headache(784.0); and Hand fracture, left.   He has past surgical history that includes Bowel resection; Nevus excision; Colon surgery; and Anterior cervical decomp/discectomy fusion (N/A, 02/12/2013).   His family history includes Cancer in his maternal aunt; Diabetes in his paternal aunt; Hyperlipidemia in his mother.He reports that he quit smoking about 2 months ago. His smoking use included Cigarettes. He has a 2.2 pack-year smoking history. He has never used smokeless tobacco. He reports that he does not drink alcohol or use illicit drugs.   ---------------------------------------------------------------------------------------------------------------------- Past Medical History  Diagnosis Date  . Crohn disease ([place])   . Chronic neck pain   . Chronic abdominal pain   . Cervical radiculopathy   . Shortness of breath      "sometimes laying down; sometimes w/activity" (10/13/2012)  . Hypertension     no meds   . Cold     2 weeks ago with cough  still   . GERD (gastroesophageal reflux disease)   . Headache(784.0)   . Hand fracture, left     Past Surgical History  Procedure Laterality Date  . Bowel resection      x2  . Nevus excision      left arm + hand  . Colon surgery      drains placed to drain cyst  . Anterior cervical decomp/discectomy fusion N/A 02/12/2013    Procedure: ANTERIOR CERVICAL DECOMPRESSION/DISCECTOMY FUSION 1 LEVEL;  Surgeon: Winfield Cunas, MD;  Location: [facility];  Service: Neurosurgery;  Laterality: N/A;  C67 anterior cervical decompression with fusion plating and bonegraft    Family History  Problem Relation Age of Onset  . Hyperlipidemia Mother   . Cancer Maternal Aunt   . Diabetes Paternal Aunt     Social History  Substance Use Topics  . Smoking status: Former Smoker -- 0.10 packs/day for 22 years    Types: Cigarettes    Quit date: 06/05/2015  . Smokeless tobacco: Never Used     Comment: fMJ  06/05/15- trying to get out of smokes  . Alcohol Use: No    ---------------------------------------------------------------------------------------------------------------------- Social History   Social History  . Marital Status: Single    Spouse Name: N/A  . Number of Children: N/A  . Years of Education: N/A   Social History Main Topics  . Smoking status: Former Smoker -- 0.10 packs/day for 22 years    Types: Cigarettes    Quit date:  06/05/2015  . Smokeless tobacco: Never Used     Comment: fMJ  06/05/15- trying to get out of smokes  . Alcohol Use: No  . Drug Use: No  . Sexual Activity: Not Currently     Comment: stopped smoking MJ 06/05/15   Other Topics Concern  . None   Social History Narrative      ----------------------------------------------------------------------------------------------------------------------  ROS Review of Systems     Objective:  BP 135/74 mmHg  Pulse 67  Temp(Src) 98.3 F (36.8 C) (Oral)  Resp 18  Ht 5\' 11"  (1.803 m)  Wt 130 lb (58.968 kg)  BMI 18.14 kg/m2  SpO2 100%  Physical Exam  Patient is good historian cooperative and compliant Pupils are equally round reactive to light extraocular muscles are intact Inspection neck reveals good range of motion with a well-healed cervical scar and no erythema or edema. His range of motion in the glenohumeral joints in upper extremities is intact with good muscle strength apparently Inspection of the abdomen reveals some mild tenderness in the right upper and lower quadrant negative on the left artery or rebound tenderness is noted He ambulates without difficulty was some midline lumbar paraspinous muscle tenderness but no overt trigger points and minimal pain with extension at the left and right rotation.    Assessment & Plan:   Zayquan was seen today for abdominal pain.  Diagnoses and all orders for this visit:  Generalized abdominal pain  Bilateral low back pain without sciatica  Chronic neck pain  Other orders -     Discontinue: oxyCODONE-acetaminophen (PERCOCET) 10-325 MG tablet; Take 1 tablet by mouth 2 (two) times daily with a meal. -     oxyCODONE-acetaminophen (PERCOCET) 10-325 MG tablet; Take 1 tablet by mouth 2 (two) times daily with a meal.     ----------------------------------------------------------------------------------------------------------------------  Problem List Items Addressed This Visit      Other   AP (abdominal pain) - Primary    Other Visit Diagnoses    Bilateral low back pain without sciatica        Relevant Medications    oxyCODONE-acetaminophen (PERCOCET) 10-325 MG tablet    Chronic neck pain        Relevant Medications    oxyCODONE-acetaminophen (PERCOCET) 10-325 MG tablet        ----------------------------------------------------------------------------------------------------------------------  1. Generalized abdominal pain Is taking Percocet tablets 10 mg twice a day for good relief and denies any diverting or illicit use with these medicines. We will refill his medications for the next month with return to clinic in 2 months He is to continue follow-up with his GI doctors   2. Bilateral low back pain without sciatica He may be a candidate for a facet block   3. Chronic neck pain Continue follow-up with his with neck surgeon as indicated   4. Chronic neck and back pain We may start him on Percocet for chronic pain management is also instructed to use NSAIDs such as Naprosyn 220mg   twice a day 5 days per week if okay with his GI doctors, prn -     ----------------------------------------------------------------------------------------------------------------------  I am having Mr. Renda maintain his HUMIRA PEN, ibuprofen, and oxyCODONE-acetaminophen.   Meds ordered this encounter  Medications  . DISCONTD: oxyCODONE-acetaminophen (PERCOCET) 10-325 MG tablet    Sig: Take 1 tablet by mouth 2 (two) times daily with a meal.    Dispense:  60 tablet    Refill:  0    Do not fill until [id number]  . oxyCODONE-acetaminophen (PERCOCET) 10-325 MG  tablet    Sig: Take 1 tablet by mouth 2 (two) times daily with a meal.    Dispense:  60 tablet    Refill:  0    Do not fill until [id number]       Follow-up: Return in about 1 month (around 09/09/2015) for evaluation, med refill.    [name], MD

## 2015-08-09 NOTE — Patient Instructions (Signed)
Prescriptions given for Percocet x 2

## 2015-10-11 ENCOUNTER — Encounter: Payer: Self-pay | Admitting: Emergency Medicine

## 2015-10-11 ENCOUNTER — Inpatient Hospital Stay
Admission: EM | Admit: 2015-10-11 | Discharge: 2015-10-13 | DRG: 386 | Disposition: A | Discharge status: Home / Self Care | Payer: Medicare Other | Attending: Internal Medicine | Admitting: Internal Medicine

## 2015-10-11 DIAGNOSIS — Z809 Family history of malignant neoplasm, unspecified: Secondary | ICD-10-CM | POA: Diagnosis not present

## 2015-10-11 DIAGNOSIS — Z833 Family history of diabetes mellitus: Secondary | ICD-10-CM

## 2015-10-11 DIAGNOSIS — I10 Essential (primary) hypertension: Secondary | ICD-10-CM | POA: Diagnosis present

## 2015-10-11 DIAGNOSIS — Z716 Tobacco abuse counseling: Secondary | ICD-10-CM

## 2015-10-11 DIAGNOSIS — Z885 Allergy status to narcotic agent status: Secondary | ICD-10-CM | POA: Diagnosis not present

## 2015-10-11 DIAGNOSIS — E871 Hypo-osmolality and hyponatremia: Secondary | ICD-10-CM | POA: Diagnosis present

## 2015-10-11 DIAGNOSIS — Z79899 Other long term (current) drug therapy: Secondary | ICD-10-CM | POA: Diagnosis not present

## 2015-10-11 DIAGNOSIS — F17210 Nicotine dependence, cigarettes, uncomplicated: Secondary | ICD-10-CM | POA: Diagnosis present

## 2015-10-11 DIAGNOSIS — G8929 Other chronic pain: Secondary | ICD-10-CM | POA: Diagnosis present

## 2015-10-11 DIAGNOSIS — Z981 Arthrodesis status: Secondary | ICD-10-CM

## 2015-10-11 DIAGNOSIS — Z9119 Patient's noncompliance with other medical treatment and regimen: Secondary | ICD-10-CM | POA: Diagnosis not present

## 2015-10-11 DIAGNOSIS — K5010 Crohn's disease of large intestine without complications: Secondary | ICD-10-CM | POA: Diagnosis not present

## 2015-10-11 DIAGNOSIS — Z9889 Other specified postprocedural states: Secondary | ICD-10-CM

## 2015-10-11 DIAGNOSIS — E860 Dehydration: Secondary | ICD-10-CM | POA: Diagnosis present

## 2015-10-11 DIAGNOSIS — M5412 Radiculopathy, cervical region: Secondary | ICD-10-CM | POA: Diagnosis present

## 2015-10-11 DIAGNOSIS — K219 Gastro-esophageal reflux disease without esophagitis: Secondary | ICD-10-CM | POA: Diagnosis present

## 2015-10-11 DIAGNOSIS — K5090 Crohn's disease, unspecified, without complications: Secondary | ICD-10-CM | POA: Diagnosis not present

## 2015-10-11 LAB — COMPREHENSIVE METABOLIC PANEL
ALK PHOS: 84 U/L (ref 38–126)
ALT: 15 U/L — ABNORMAL LOW (ref 17–63)
ANION GAP: 10 (ref 5–15)
AST: 21 U/L (ref 15–41)
Albumin: 4.8 g/dL (ref 3.5–5.0)
BUN: 13 mg/dL (ref 6–20)
CALCIUM: 9.8 mg/dL (ref 8.9–10.3)
CHLORIDE: 98 mmol/L — AB (ref 101–111)
CO2: 24 mmol/L (ref 22–32)
Creatinine, Ser: 1.04 mg/dL (ref 0.61–1.24)
GFR calc non Af Amer: >60 mL/min (ref >60)
Glucose, Bld: 103 mg/dL — ABNORMAL HIGH (ref 65–99)
Potassium: 4.4 mmol/L (ref 3.5–5.1)
SODIUM: 132 mmol/L — AB (ref 135–145)
Total Bilirubin: 0.8 mg/dL (ref 0.3–1.2)
Total Protein: 8.6 g/dL — ABNORMAL HIGH (ref 6.5–8.1)

## 2015-10-11 LAB — CBC
HCT: 40.4 % (ref 40.0–52.0)
HEMOGLOBIN: 14.3 g/dL (ref 13.0–18.0)
MCH: 33.5 pg (ref 26.0–34.0)
MCHC: 35.4 g/dL (ref 32.0–36.0)
MCV: 94.5 fL (ref 80.0–100.0)
Platelets: 265 10*3/uL (ref 150–440)
RBC: 4.27 MIL/uL — AB (ref 4.40–5.90)
RDW: 13.1 % (ref 11.5–14.5)
WBC: 10.1 10*3/uL (ref 3.8–10.6)

## 2015-10-11 LAB — URINALYSIS COMPLETE WITH MICROSCOPIC (ARMC ONLY)
Bacteria, UA: NONE SEEN
Bilirubin Urine: NEGATIVE
Glucose, UA: NEGATIVE mg/dL
KETONES UR: NEGATIVE mg/dL
Leukocytes, UA: NEGATIVE
NITRITE: NEGATIVE
PH: 6 (ref 5.0–8.0)
PROTEIN: NEGATIVE mg/dL
SPECIFIC GRAVITY, URINE: 1.011 (ref 1.005–1.030)
Squamous Epithelial / LPF: NONE SEEN
WBC UA: NONE SEEN WBC/hpf (ref 0–5)

## 2015-10-11 LAB — LIPASE, BLOOD: LIPASE: 36 U/L (ref 11–51)

## 2015-10-11 MED ORDER — PANTOPRAZOLE SODIUM 40 MG PO TBEC
40.0000 mg | DELAYED_RELEASE_TABLET | Freq: Every day | ORAL | Status: DC
  Administered 2015-10-11 – 2015-10-13 (×3): 40 mg via ORAL
  Filled 2015-10-11 (×3): qty 1

## 2015-10-11 MED ORDER — ENOXAPARIN SODIUM 40 MG/0.4ML ~~LOC~~ SOLN
40.0000 mg | SUBCUTANEOUS | Status: DC
  Administered 2015-10-11: 21:00:00 40 mg via SUBCUTANEOUS
  Filled 2015-10-11 (×2): qty 0.4

## 2015-10-11 MED ORDER — FENTANYL CITRATE (PF) 100 MCG/2ML IJ SOLN
INTRAMUSCULAR | Status: AC
Start: 2015-10-11 — End: 2015-10-11
  Administered 2015-10-11: 50 ug via INTRAVENOUS
  Filled 2015-10-11: qty 2

## 2015-10-11 MED ORDER — SODIUM CHLORIDE 0.9 % IV SOLN
INTRAVENOUS | Status: AC
  Administered 2015-10-11: 3 g via INTRAVENOUS
  Filled 2015-10-11: qty 3

## 2015-10-11 MED ORDER — OXYCODONE-ACETAMINOPHEN 10-325 MG PO TABS: 1.0000 | ORAL_TABLET | Freq: Two times a day (BID) | ORAL | Status: DC

## 2015-10-11 MED ORDER — SODIUM CHLORIDE 0.9 % IV SOLN
INTRAVENOUS | Status: DC
  Administered 2015-10-11 – 2015-10-13 (×4): via INTRAVENOUS

## 2015-10-11 MED ORDER — METHYLPREDNISOLONE SODIUM SUCC 125 MG IJ SOLR
INTRAMUSCULAR | Status: AC
  Filled 2015-10-11: qty 2

## 2015-10-11 MED ORDER — OXYCODONE HCL 5 MG PO TABS
5.0000 mg | ORAL_TABLET | Freq: Two times a day (BID) | ORAL | Status: DC
  Administered 2015-10-11 – 2015-10-12 (×2): 5 mg via ORAL
  Filled 2015-10-11 (×2): qty 1

## 2015-10-11 MED ORDER — SODIUM CHLORIDE 0.9 % IV SOLN
3.0000 g | Freq: Three times a day (TID) | INTRAVENOUS | Status: DC
  Administered 2015-10-11 – 2015-10-12 (×3): 3 g via INTRAVENOUS
  Filled 2015-10-11 (×4): qty 3

## 2015-10-11 MED ORDER — OXYCODONE-ACETAMINOPHEN 5-325 MG PO TABS
1.0000 | ORAL_TABLET | Freq: Two times a day (BID) | ORAL | Status: DC
Start: 2015-10-11 — End: 2015-10-12
  Administered 2015-10-11 – 2015-10-12 (×2): 1 via ORAL
  Filled 2015-10-11 (×2): qty 1

## 2015-10-11 MED ORDER — ACETAMINOPHEN 325 MG PO TABS
650.0000 mg | ORAL_TABLET | Freq: Four times a day (QID) | ORAL | Status: DC | PRN
Start: 2015-10-11 — End: 2015-10-13

## 2015-10-11 MED ORDER — ONDANSETRON HCL 4 MG/2ML IJ SOLN
4.0000 mg | Freq: Once | INTRAMUSCULAR | Status: AC
  Administered 2015-10-11: 4 mg via INTRAVENOUS

## 2015-10-11 MED ORDER — KETOROLAC TROMETHAMINE 15 MG/ML IJ SOLN
15.0000 mg | Freq: Four times a day (QID) | INTRAMUSCULAR | Status: DC | PRN
  Administered 2015-10-11 – 2015-10-12 (×4): 15 mg via INTRAVENOUS
  Filled 2015-10-11 (×4): qty 1

## 2015-10-11 MED ORDER — ONDANSETRON HCL 4 MG PO TABS
4.0000 mg | ORAL_TABLET | Freq: Four times a day (QID) | ORAL | Status: DC | PRN
  Administered 2015-10-11 – 2015-10-12 (×2): 4 mg via ORAL
  Filled 2015-10-11 (×2): qty 1

## 2015-10-11 MED ORDER — ONDANSETRON HCL 4 MG/2ML IJ SOLN
INTRAMUSCULAR | Status: AC
Start: 2015-10-11 — End: 2015-10-11
  Administered 2015-10-11: 4 mg via INTRAVENOUS
  Filled 2015-10-11: qty 2

## 2015-10-11 MED ORDER — ONDANSETRON HCL 4 MG/2ML IJ SOLN: 4.0000 mg | Freq: Four times a day (QID) | INTRAMUSCULAR | Status: DC | PRN

## 2015-10-11 MED ORDER — METHYLPREDNISOLONE SODIUM SUCC 40 MG IJ SOLR
40.0000 mg | Freq: Two times a day (BID) | INTRAMUSCULAR | Status: DC
  Administered 2015-10-11 – 2015-10-13 (×4): 40 mg via INTRAVENOUS
  Filled 2015-10-11 (×4): qty 1

## 2015-10-11 MED ORDER — ACETAMINOPHEN 650 MG RE SUPP: 650.0000 mg | Freq: Four times a day (QID) | RECTAL | Status: DC | PRN

## 2015-10-11 MED ORDER — SODIUM CHLORIDE 0.9 % IV BOLUS (SEPSIS)
1000.0000 mL | Freq: Once | INTRAVENOUS | Status: AC
  Administered 2015-10-11: 1000 mL via INTRAVENOUS

## 2015-10-11 MED ORDER — FENTANYL CITRATE (PF) 100 MCG/2ML IJ SOLN
50.0000 ug | Freq: Once | INTRAMUSCULAR | Status: AC
  Administered 2015-10-11: 50 ug via INTRAVENOUS

## 2015-10-11 NOTE — ED Provider Notes (Signed)
[facility] Emergency Department Provider Note   ____________________________________________   First MD Initiated Contact with Patient 10/11/15 1405     (approximate)  I have reviewed the triage vital signs and the nursing notes.   HISTORY  Chief Complaint Abdominal Pain   HPI Benjamin Murillo is a 45 y.o. male with a history of Crohn's disease was presenting to the emergency department with abdominal pain. He says that he has been having nausea, vomiting and diarrhea. He says that the pain started about 2 weeks ago and he has missed his last Humira shot. He said that the pharmacy was out of his Humira and he will be able to pick it up by the end of the week. He says he is also out of his pain medications. He says that he is usually admitted to the hospital for his flares of his Crohn's disease. He says that he is concerned because he has been moving his bowels 8-9 times per day. Denies any blood in the stool. Says his pain is a 910 at this time and sharp and diffuse. It is nonradiating. He denies any burning with urination. Patient has been able to take sips of water today while in the emergency department without vomiting.   Past Medical History:  Diagnosis Date  . Cervical radiculopathy   . Chronic abdominal pain   . Chronic neck pain   . Cold    2 weeks ago with cough  still   . Crohn disease ([place])   . GERD (gastroesophageal reflux disease)   . Hand fracture, left   . Headache(784.0)   . Hypertension    no meds   . Shortness of breath    "sometimes laying down; sometimes w/activity" (10/13/2012)    Patient Active Problem List   Diagnosis Date Noted  . AP (abdominal pain)   . Crohn disease ([place])   . Crohn's colitis ([place]) 01/03/2014  . HNP (herniated nucleus pulposus), cervical 02/12/2013  . Tobacco abuse 10/14/2012  . Noncompliance 10/14/2012  . Acute abdominal pain 10/13/2012  . Chronic abdominal pain   . Crohn's ileitis ([place]) 06/06/2012      Past Surgical History:  Procedure Laterality Date  . ANTERIOR CERVICAL DECOMP/DISCECTOMY FUSION N/A 02/12/2013   Procedure: ANTERIOR CERVICAL DECOMPRESSION/DISCECTOMY FUSION 1 LEVEL;  Surgeon: Winfield Cunas, MD;  Location: [facility];  Service: Neurosurgery;  Laterality: N/A;  C67 anterior cervical decompression with fusion plating and bonegraft  . BOWEL RESECTION     x2  . COLON SURGERY     drains placed to drain cyst  . NEVUS EXCISION     left arm + hand    Prior to Admission medications   Medication Sig Start Date End Date Taking? Authorizing Provider  HUMIRA PEN 40 MG/0.8ML injection Inject 40 mg as directed every 14 (fourteen) days.  03/22/13   Historical Provider, MD  ibuprofen (ADVIL,MOTRIN) 800 MG tablet Take 1 tablet (800 mg total) by mouth every 8 (eight) hours as needed. 07/04/15   Johnn Hai, PA-C  oxyCODONE-acetaminophen (PERCOCET) 10-325 MG tablet Take 1 tablet by mouth 2 (two) times daily with a meal. 08/09/15   [name], MD    Allergies Vicodin [hydrocodone-acetaminophen]; Tramadol; and Morphine and related  Family History  Problem Relation Age of Onset  . Hyperlipidemia Mother   . Cancer Maternal Aunt   . Diabetes Paternal Aunt     Social History Social History  Substance Use Topics  . Smoking status:  Former Smoker    Packs/day: 0.10    Years: 22.00    Types: Cigarettes    Quit date: 06/05/2015  . Smokeless tobacco: Never Used     Comment: fMJ  06/05/15- trying to get out of smokes  . Alcohol use No    Review of Systems Constitutional: No fever/chills Eyes: No visual changes. ENT: No sore throat. Cardiovascular: Denies chest pain. Respiratory: Denies shortness of breath. Gastrointestinal: No abdominal pain.  No nausea, no vomiting.  No diarrhea.  No constipation. Genitourinary: Negative for dysuria. Musculoskeletal: Negative for back pain. Skin: Negative for rash. Neurological: Negative for headaches, focal weakness or  numbness.  10-point ROS otherwise negative.  ____________________________________________   PHYSICAL EXAM:  VITAL SIGNS: ED Triage Vitals  Enc Vitals Group     BP 10/11/15 1007 136/85     Pulse Rate 10/11/15 1007 76     Resp 10/11/15 1007 16     Temp 10/11/15 1007 97.6 F (36.4 C)     Temp Source 10/11/15 1007 Oral     SpO2 10/11/15 1007 99 %     Weight 10/11/15 1008 140 lb (63.5 kg)     Height 10/11/15 1008 5\' 11"  (1.803 m)     Head Circumference --      Peak Flow --      Pain Score 10/11/15 1013 10     Pain Loc --      Pain Edu? --      Excl. in [place]? --     Constitutional: Alert and oriented. Well appearing and in no acute distress. Eyes: Conjunctivae are normal. PERRL. EOMI. Head: Atraumatic. Nose: No congestion/rhinnorhea. Mouth/Throat: Mucous membranes are moist.   Neck: No stridor.   Cardiovascular: Normal rate, regular rhythm. Grossly normal heart sounds.   Respiratory: Normal respiratory effort.  No retractions. Lungs CTAB. Gastrointestinal: Soft With mild and diffuse tenderness to palpation. No distention. No CVA tenderness. Musculoskeletal: No lower extremity tenderness nor edema.  No joint effusions. Neurologic:  Normal speech and language. No gross focal neurologic deficits are appreciated.  Skin:  Skin is warm, dry and intact. No rash noted. Psychiatric: Mood and affect are normal. Speech and behavior are normal.  ____________________________________________   LABS (all labs ordered are listed, but only abnormal results are displayed)  Labs Reviewed  COMPREHENSIVE METABOLIC PANEL - Abnormal; Notable for the following:       Result Value   Sodium 132 (*)    Chloride 98 (*)    Glucose, Bld 103 (*)    Total Protein 8.6 (*)    ALT 15 (*)    All other components within normal limits  CBC - Abnormal; Notable for the following:    RBC 4.27 (*)    All other components within normal limits  URINALYSIS COMPLETEWITH MICROSCOPIC ([facility] ONLY) - Abnormal;  Notable for the following:    Color, Urine YELLOW (*)    APPearance CLEAR (*)    Hgb urine dipstick 1+ (*)    All other components within normal limits  LIPASE, BLOOD   ____________________________________________  EKG   ____________________________________________  RADIOLOGY  ____________________________________________   PROCEDURES  Procedure(s) performed:   Procedures  Critical Care performed:   ____________________________________________   INITIAL IMPRESSION / ASSESSMENT AND PLAN / ED COURSE  Pertinent labs & imaging results that were available during my care of the patient were reviewed by me and considered in my medical decision making (see chart for details).  ----------------------------------------- 2:31 PM on 10/11/2015 -----------------------------------------  Discussed  case Dr. Vira Agar of gastroenterology who recommends admission for the patient. He says that if the patient is having nausea, vomiting and diarrhea that he should be admitted for IV steroids to make sure that he is tolerating by mouth medications until he can be restarted on his Humira. I discussed with the patient is understanding of the plan and is willing to comply. I also discussed this with the hospitalist, Dr. Posey Pronto, who will be admitting the patient. Patient does not examine with peritoneal signs. Reassuring labs. Will defer imaging at this time.  Clinical Course     ____________________________________________   FINAL CLINICAL IMPRESSION(S) / ED DIAGNOSES      NEW MEDICATIONS STARTED DURING THIS VISIT:  Crohn's flare.   Note:  This document was prepared using Dragon voice recognition software and may include unintentional dictation errors.    Orbie Pyo, MD 10/11/15 [phone]

## 2015-10-11 NOTE — ED Notes (Signed)
Admitting MD at bedside.

## 2015-10-11 NOTE — Consult Note (Signed)
Consultation  Referring Provider:     Dr Posey Pronto Admit date: 10/11/15 Consult date : 10/11/15        Reason for Consultation:  Crohns flare             HPI:   Benjamin Murillo is a 45 y.o. male with history of stricturing ileocolonic crohns, s/p ileocolonic resection 1998/segmental ileocolectomy 2009 due to severe recurrent stricturing Crohns, chronic abdominal pain, cervical radiculopathy, htn, headaches, and a duodenal ulcer. He was admitted with Crohn's exacerbation and is receiving solumedrol/unasyn/ivf. On admission, had mild hyponatremia- otherwise cmp & cbc unremarkable. Afebrile, VSS.  Patient reports history of chronic umbilical pain- with exacerbation of this pain with NVD about 3 w ago after having URI.  States he did not take any antibiotics for this. Reports he has been having multiple loose greenish black stools/d and whatever he eats, he vomits up, or has an urgent stool. States his weight is down 5lb. Has been on and off NSAIDs over the last several months, although patient states he does not use these often as they are bad for his Crohns. States he has been reducing his sodas and eating blander foods. Still smoking cigarettes- admits to 3/d. States he has not seen any bloody stools/hematemesis, had any problems swallowing, or further GI issues. Last seen in ED for Crohns flare 1/17 treated with outpatient steroid taper and was to follow up with GI, although has not to date. Does states his PCP has referred him to [facility] for Crohns care 44m ago- has upcoming appt. Was followed at [facility] until 6/16- please see below.   His Home meds listed as humira, ibuprofen, percocet, prednisone. I did speak with Dominica Severin at [place] ([address] - his reported pharmacy) states patient has not filled his Humira since 11 January 2015. I do not see any notes from any other GI provider since 6/16 at [facility]. He was following with Dr Andree Elk in the pain clinic for chronic generalized and abdominal pain  until 7/17. Tested + for cannabis 4/17, but negative for this afterwards. Has been taking Percocet for his abdominal pain. In discussion with patient about his Humira, he does admit that he has had 2 injections of this over the last 9 months- last was 2-3w ago. States he did not feel like it helped with his chronic abdominal pain much, so just stopped taking it regularly. I am unsure if he has been taking the prednisone. He has a hard time recalling his mediation history.  Chart review indicates Prevnar 13 given 2016. Followed with Dr Runge/Dr Laverta Baltimore at [facility]- last seen 6/16- has  Having NVD flare at that time and EGD/colonoscopy was recommended. EGD 2016 appeared normal- done by Dr Marene Lenz;  Colonoscopy  2016demonstrated - Aphtha in the sigmoid colon and in the descending colon.  - Patent end-to-end ileo-colonic anastomosis,   characterized by congestion, edema, erythema, friable mucosa, a hemorrhagic appearance, inflammation and mild stenosis. Dilated. Biopsied.  - Aphtha in the distal ileum.  - The examination was otherwise normal.  - Non-bleeding internal hemorrhoids.  Biopsy of the colon at hte ileoanastamosis demonstrated: -Chronic enteritis with patchy moderate activity, ulceration, reactive epithelial atypia, and foveolar metaplasia -No evidence of viral cytopathic effect, granulomata, dysplasia, or carcinoma -CMV immunostain is negative Had adalimumab anitbody check at that time- antibody level was low, although it also looks like his adalimumab level was less than the detectable level- 0.6.  CRP <.5 at that time. Stool studies negative.  He did not follow up at [facility] afterwards.  Crohns history at [facility] also revealed he was on biweekly Humira- had been on 78mp and entocort prior but was these were stopped at some point- 2014 or 2015.  There is also a past history of Remicade but this was stopped due to  nonadherence, and had been followed by Dr Alisia Ferrari in past but was discharged from his sevice due to repeated noshows/nonadherence. 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  Last Ct abdomen/pelvis: 01/03/14- Questionable wall thickening at the ascending colon versus artifact from underdistention, active Crohn's not excluded   Additional recent ENDOSCOPIES:   Colonoscopy 06/2013 showed Rutgeerts i0 at ileocolonic anastomosis, and colon was endoscopically normal. EGD was also unremarkable at that time.       Past Medical History:  Diagnosis Date  . Cervical radiculopathy   . Chronic abdominal pain   . Chronic neck pain   . Cold    2 weeks ago with cough  still   . Crohn disease ([place])   . GERD (gastroesophageal reflux disease)   . Hand fracture, left   . Headache(784.0)   . Hypertension    no meds   . Shortness of breath    "sometimes laying down; sometimes w/activity" (10/13/2012)    Past Surgical History:  Procedure Laterality Date  . ANTERIOR CERVICAL DECOMP/DISCECTOMY FUSION N/A 02/12/2013   Procedure: ANTERIOR CERVICAL DECOMPRESSION/DISCECTOMY FUSION 1 LEVEL;  Surgeon: Winfield Cunas, MD;  Location: [place] [facility];  Service: Neurosurgery;  Laterality: N/A;  C67 anterior cervical decompression with fusion plating and bonegraft  . BOWEL RESECTION     x2  . COLON SURGERY     drains placed to drain cyst  . NEVUS EXCISION     left arm + hand    Family History  Problem Relation Age of Onset  . Hyperlipidemia Mother   . Cancer Maternal Aunt   . Diabetes Paternal Aunt     Social History  Substance Use Topics  . Smoking status: Former Smoker    Packs/day: 0.10    Years: 22.00    Types: Cigarettes    Quit date: 06/05/2015  . Smokeless tobacco: Never Used     Comment: fMJ  06/05/15- trying to get out of smokes  . Alcohol use No    Prior to Admission medications   Medication Sig Start Date End Date Taking? Authorizing Provider  HUMIRA PEN 40 MG/0.8ML injection Inject 40 mg as  directed every 14 (fourteen) days.  03/22/13  Yes Historical Provider, MD  ibuprofen (ADVIL,MOTRIN) 800 MG tablet Take 1 tablet (800 mg total) by mouth every 8 (eight) hours as needed. 07/04/15  Yes Johnn Hai, PA-C  oxyCODONE-acetaminophen (PERCOCET) 10-325 MG tablet Take 1 tablet by mouth 2 (two) times daily with a meal. 08/09/15  Yes [name], MD  predniSONE (DELTASONE) 10 MG tablet Take 10 mg by mouth daily with breakfast. 2 tablets daily until Friday 10/13/2015, then 1 tablet daily thereafter until Thursday 10/19/2015   Yes Historical Provider, MD    Current Facility-Administered Medications  Medication Dose Route Frequency Provider Last Rate Last Dose  . Ampicillin-Sulbactam (UNASYN) 3 g in sodium chloride 0.9 % 100 mL IVPB  3 g Intravenous Q8H Fritzi Mandes, MD 100 mL/hr at 10/11/15 1517 3 g at 10/11/15 1517  . methylPREDNISolone sodium succinate (SOLU-MEDROL) 125 mg/2 mL injection           . methylPREDNISolone sodium succinate (SOLU-MEDROL) 40 mg/mL injection 40 mg  40 mg Intravenous Q12H Fritzi Mandes, MD   40 mg at 10/11/15 1550   Current Outpatient Prescriptions  Medication Sig Dispense Refill  . HUMIRA PEN 40 MG/0.8ML injection Inject 40 mg as directed every 14 (fourteen) days.     Marland Kitchen ibuprofen (ADVIL,MOTRIN) 800 MG tablet Take 1 tablet (800 mg total) by mouth every 8 (eight) hours as needed. 30 tablet 0  . oxyCODONE-acetaminophen (PERCOCET) 10-325 MG tablet Take 1 tablet by mouth 2 (two) times daily with a meal. 60 tablet 0  . predniSONE (DELTASONE) 10 MG tablet Take 10 mg by mouth daily with breakfast. 2 tablets daily until Friday 10/13/2015, then 1 tablet daily thereafter until Thursday 10/19/2015      Allergies as of 10/11/2015 - Review Complete 10/11/2015  Allergen Reaction Noted  . Vicodin [hydrocodone-acetaminophen] Other (See Comments) 10/17/2012  . Tramadol Other (See Comments) 05/19/2012  . Morphine and related Itching 09/27/2011     Review of Systems:    All systems  reviewed and negative except where noted in HPI.    Physical Exam:  Vital signs in last 24 hours: Temp:  [97.6 F (36.4 C)] 97.6 F (36.4 C) (09/13 1007) Pulse Rate:  [76] 76 (09/13 1007) Resp:  [16] 16 (09/13 1007) BP: (136)/(85) 136/85 (09/13 1007) SpO2:  [99 %] 99 % (09/13 1007) Weight:  [63.5 kg (140 lb)] 63.5 kg (140 lb) (09/13 1008)   General:   Somewhat anxious man in NAD Head:  Normocephalic and atraumatic. Eyes:   No icterus.   Conjunctiva pink. Ears:  Normal auditory acuity. Mouth: Mucosa pink moist, no lesions. Neck:  Supple; no masses felt Lungs: Respirations even and unlabored. Lungs clear to auscultation bilaterally.   No wheezes, crackles, or rhonchi.  Heart:  S1S2, RRR, no MRG. No edema. Abdomen:   Flat, soft, nondistended, nontender. Normal bowel sounds. No appreciable masses or hepatomegaly. No rebound signs or other peritoneal signs. Msk:  MAEW x4, No clubbing or cyanosis. Strength 5/5. Symmetrical without gross deformities. Neurologic:  Alert and  oriented x4;  Cranial nerves II-XII intact.  Skin:  Warm, dry, pink without significant lesions or rashes. Psych:  Alert and cooperative. Normal affect.  LAB RESULTS:  Recent Labs  10/11/15 1015  WBC 10.1  HGB 14.3  HCT 40.4  PLT 265   BMET  Recent Labs  10/11/15 1015  NA 132*  K 4.4  CL 98*  CO2 24  GLUCOSE 103*  BUN 13  CREATININE 1.04  CALCIUM 9.8   LFT  Recent Labs  10/11/15 1015  PROT 8.6*  ALBUMIN 4.8  AST 21  ALT 15*  ALKPHOS 84  BILITOT 0.8   PT/INR No results for input(s): LABPROT, INR in the last 72 hours.  STUDIES: No results found.     Impression / Plan:   1. Crohns, likely FAP. There is also medical nonadherence. Agree with current treatment, will add Pantoprazole 40mg  po qd.he does not have an acute abodmen.  Discussed tobacco cessation  And rationale with him and strongly recommended this. Discouraged NSAIDs. Also discussed the need to continued regular GI care for  his chronic conditions, rationale for adherence, goals of therapy, what the crohns medications do, as well as side effects, and the need for honest discussion with his providers if there are problems or concerns with his therapy.  Will discuss endoscopies with Dr Tiffany Kocher.  Thank you very much for this consult. These services were provided by Stephens November, NP-C, in collaboration with Lorrin Goodell, MD,  with whom I have discussed this patient in full- and will plan for outpatient endoscopies unless his clinical situation changes.   Stephens November, NP-C

## 2015-10-11 NOTE — ED Triage Notes (Signed)
Pt presents with abd pain for two weeks. Pt with hx of crohns and ran out of his meds on sept 10. Nad.

## 2015-10-11 NOTE — ED Notes (Signed)
EDP at bedside  

## 2015-10-11 NOTE — H&P (Signed)
[place] at [place] NAME: Benjamin Murillo    MR#:  [id number]  DATE OF BIRTH:  [birth year]  DATE OF ADMISSION:  10/11/2015  PRIMARY CARE PHYSICIAN: Gennette Pac, FNP   REQUESTING/REFERRING PHYSICIAN:Dr Schaevitz  CHIEF COMPLAINT:   Nausea vomiting diarrhea for 1-1/2 weeks. I think I have my Crohn's flare HISTORY OF PRESENT ILLNESS:  Benjamin Murillo  is a 45 y.o. male with a known history ofChronic Crohn's disease taking Humira shots twice a week. Emergency room with increasing nausea vomiting and diarrhea about 8-10 episodes per day on bloody feeling weak and dehydrated  Unable to keep anything orally other than occasional Gatorade and clear liquids  ER physician spoke with GI Dr. Vira Agar recommends patient comes in  PAST MEDICAL HISTORY:   Past Medical History:  Diagnosis Date  . Cervical radiculopathy   . Chronic abdominal pain   . Chronic neck pain   . Cold    2 weeks ago with cough  still   . Crohn disease ([place])   . GERD (gastroesophageal reflux disease)   . Hand fracture, left   . Headache(784.0)   . Hypertension    no meds   . Shortness of breath    "sometimes laying down; sometimes w/activity" (10/13/2012)    PAST SURGICAL HISTOIRY:   Past Surgical History:  Procedure Laterality Date  . ANTERIOR CERVICAL DECOMP/DISCECTOMY FUSION N/A 02/12/2013   Procedure: ANTERIOR CERVICAL DECOMPRESSION/DISCECTOMY FUSION 1 LEVEL;  Surgeon: Winfield Cunas, MD;  Location: [place] [facility];  Service: Neurosurgery;  Laterality: N/A;  C67 anterior cervical decompression with fusion plating and bonegraft  . BOWEL RESECTION     x2  . COLON SURGERY     drains placed to drain cyst  . NEVUS EXCISION     left arm + hand    SOCIAL HISTORY:   Social History  Substance Use Topics  . Smoking status: Former Smoker    Packs/day: 0.10    Years: 22.00    Types: Cigarettes    Quit date: 06/05/2015  . Smokeless tobacco: Never Used     Comment:  fMJ  06/05/15- trying to get out of smokes  . Alcohol use No    FAMILY HISTORY:   Family History  Problem Relation Age of Onset  . Hyperlipidemia Mother   . Cancer Maternal Aunt   . Diabetes Paternal Aunt     DRUG ALLERGIES:   Allergies  Allergen Reactions  . Vicodin [Hydrocodone-Acetaminophen] Other (See Comments)    anxious  . Tramadol Other (See Comments)    Makes patient anxious and nervous.  . Morphine And Related Itching    High doses    REVIEW OF SYSTEMS:  Review of Systems  Constitutional: Negative for chills, fever and weight loss.  HENT: Negative for ear discharge, ear pain and nosebleeds.   Eyes: Negative for blurred vision, pain and discharge.  Respiratory: Negative for sputum production, shortness of breath, wheezing and stridor.   Cardiovascular: Negative for chest pain, palpitations, orthopnea and PND.  Gastrointestinal: Positive for diarrhea, nausea and vomiting. Negative for abdominal pain.  Genitourinary: Negative for frequency and urgency.  Musculoskeletal: Negative for back pain and joint pain.  Neurological: Positive for weakness. Negative for sensory change, speech change and focal weakness.  Psychiatric/Behavioral: Negative for depression and hallucinations. The patient is not nervous/anxious.   All other systems reviewed and are negative.    MEDICATIONS AT HOME:   Prior to Admission medications   Medication  Sig Start Date End Date Taking? Authorizing Provider  HUMIRA PEN 40 MG/0.8ML injection Inject 40 mg as directed every 14 (fourteen) days.  03/22/13   Historical Provider, MD  ibuprofen (ADVIL,MOTRIN) 800 MG tablet Take 1 tablet (800 mg total) by mouth every 8 (eight) hours as needed. 07/04/15   Johnn Hai, PA-C  oxyCODONE-acetaminophen (PERCOCET) 10-325 MG tablet Take 1 tablet by mouth 2 (two) times daily with a meal. 08/09/15   [name], MD      VITAL SIGNS:  Blood pressure 136/85, pulse 76, temperature 97.6 F (36.4 C), temperature  source Oral, resp. rate 16, height 5' 11"  (1.803 m), weight 63.5 kg (140 lb), SpO2 99 %.  PHYSICAL EXAMINATION:  GENERAL:  45 y.o.-year-old patient lying in the bed with no acute distress. thin EYES: Pupils equal, round, reactive to light and accommodation. No scleral icterus. Extraocular muscles intact.  HEENT: Head atraumatic, normocephalic. Oropharynx and nasopharynx clear.  NECK:  Supple, no jugular venous distention. No thyroid enlargement, no tenderness.  LUNGS: Normal breath sounds bilaterally, no wheezing, rales,rhonchi or crepitation. No use of accessory muscles of respiration.  CARDIOVASCULAR: S1, S2 normal. No murmurs, rubs, or gallops.  ABDOMEN: Soft, nontender, nondistended. Bowel sounds present. No organomegaly or mass.  EXTREMITIES: No pedal edema, cyanosis, or clubbing.  NEUROLOGIC: Cranial nerves II through XII are intact. Muscle strength 5/5 in all extremities. Sensation intact. Gait not checked.  PSYCHIATRIC: The patient is alert and oriented x 3.  SKIN: No obvious rash, lesion, or ulcer.   LABORATORY PANEL:   CBC  Recent Labs Lab 10/11/15 1015  WBC 10.1  HGB 14.3  HCT 40.4  PLT 265   ------------------------------------------------------------------------------------------------------------------  Chemistries   Recent Labs Lab 10/11/15 1015  NA 132*  K 4.4  CL 98*  CO2 24  GLUCOSE 103*  BUN 13  CREATININE 1.04  CALCIUM 9.8  AST 21  ALT 15*  ALKPHOS 84  BILITOT 0.8   ------------------------------------------------------------------------------------------------------------------  Cardiac Enzymes No results for input(s): TROPONINI in the last 168 hours. ------------------------------------------------------------------------------------------------------------------  RADIOLOGY:  No results found.  EKG:    IMPRESSION AND PLAN:    Benjamin Murillo  is a 45 y.o. male with a known history ofChronic Crohn's disease taking Humira shots twice a  week. Emergency room with increasing nausea vomiting and diarrhea about 8-10 episodes per day on bloody feeling weak and dehydrated   1. Acute Crohn's flare -Admit to medical floor -GI consultation with Dr. Vira Agar -IV Solu-Medrol 40 mg twice a day, IV Augmentin -When necessary pain meds -Clear liquid diet  2. Dehydration clinical -IV fluids  3. DVT prophylaxis subcutaneous Lovenox  4.Tobacco abuse smoking cessation advised 4 mins spent  All the records are reviewed and case discussed with ED provider. Management plans discussed with the patient, family and they are in agreement.  CODE STATUS: FULL TOTAL TIME TAKING CARE OF THIS PATIENT:19mnutes.    Clarivel Callaway M.D on 10/11/2015 at 2:54 PM  Between 7am to 6pm - Pager - [phone]  After 6pm go to [email or web address] - password [email or web address]  Office  [phone] CC: Primary care physician; EGennette Pac FNP

## 2015-10-12 ENCOUNTER — Encounter: Payer: Medicare Other | Admitting: Anesthesiology

## 2015-10-12 LAB — SEDIMENTATION RATE: SED RATE: 13 mm/h (ref 0–15)

## 2015-10-12 LAB — C-REACTIVE PROTEIN: CRP: 0.5 mg/dL (ref <1.0)

## 2015-10-12 MED ORDER — NICOTINE 14 MG/24HR TD PT24
14.0000 mg | MEDICATED_PATCH | Freq: Every day | TRANSDERMAL | Status: DC
  Administered 2015-10-12 – 2015-10-13 (×2): 14 mg via TRANSDERMAL
  Filled 2015-10-12 (×2): qty 1

## 2015-10-12 MED ORDER — CIPROFLOXACIN HCL 500 MG PO TABS
500.0000 mg | ORAL_TABLET | Freq: Two times a day (BID) | ORAL | Status: DC
  Administered 2015-10-12 – 2015-10-13 (×2): 500 mg via ORAL
  Filled 2015-10-12 (×2): qty 1

## 2015-10-12 MED ORDER — OXYCODONE HCL 5 MG PO TABS
10.0000 mg | ORAL_TABLET | Freq: Four times a day (QID) | ORAL | Status: DC | PRN
  Administered 2015-10-12 – 2015-10-13 (×2): 10 mg via ORAL
  Filled 2015-10-12 (×3): qty 2

## 2015-10-12 MED ORDER — METRONIDAZOLE 500 MG PO TABS
500.0000 mg | ORAL_TABLET | Freq: Three times a day (TID) | ORAL | Status: DC
  Administered 2015-10-12 – 2015-10-13 (×3): 500 mg via ORAL
  Filled 2015-10-12 (×4): qty 1

## 2015-10-12 NOTE — Care Management (Signed)
Admitted with the diagnosis of crohn's colitis. Lives with girlfriend [place]. Cousin is [employer] [phone]). Last seen Drs Valentino Hue and Golden in [place] 07/14/15.  States he has an appointment with a GI doctor at [facility] sometime next week. Doesn't know who the doctor is. Takes care of all basic and instrumental activities of daily living himself, drives. Prescriptions are filled at [facility] on [address]. No home health, home oxygen, or skilled nursing in the past. Uses no aids for ambulation. Will transport self home.  Shelbie Ammons RN MSN CCM Care Management [phone]

## 2015-10-12 NOTE — Care Management Important Message (Signed)
Important Message  Patient Details  Name: Benjamin Murillo MRN: [id number] Date of Birth: 1970-12-18   Medicare Important Message Given:  Yes    Shelbie Ammons, RN 10/12/2015, 11:02 AM

## 2015-10-12 NOTE — Progress Notes (Signed)
[place] at [place] NAME: Benjamin Murillo    MR#:  [id number]  DATE OF BIRTH:  1971-01-21  SUBJECTIVE:  CHIEF COMPLAINT:   Chief Complaint  Patient presents with  . Abdominal Pain   - known h/o crohns disease- admitted with abdominal pain, nausea, vomiting and diarrhea- - symptoms improving. Requesting food  REVIEW OF SYSTEMS:  Review of Systems  Constitutional: Negative for chills, fever and malaise/fatigue.  HENT: Negative for ear discharge, ear pain and tinnitus.   Eyes: Negative for blurred vision and double vision.  Respiratory: Negative for cough, shortness of breath and wheezing.   Cardiovascular: Negative for chest pain, palpitations and leg swelling.  Gastrointestinal: Positive for abdominal pain, diarrhea and nausea. Negative for constipation and vomiting.  Genitourinary: Negative for dysuria.  Musculoskeletal: Negative for myalgias.  Neurological: Negative for dizziness, speech change, focal weakness, seizures and headaches.  Psychiatric/Behavioral: Negative for depression.    DRUG ALLERGIES:   Allergies  Allergen Reactions  . Vicodin [Hydrocodone-Acetaminophen] Other (See Comments)    anxious  . Tramadol Other (See Comments)    Makes patient anxious and nervous.  . Morphine And Related Itching    High doses    VITALS:  Blood pressure 110/68, pulse (!) 58, temperature 98.5 F (36.9 C), temperature source Oral, resp. rate 18, height 5' 11"  (1.803 m), weight 63.5 kg (140 lb), SpO2 99 %.  PHYSICAL EXAMINATION:  Physical Exam  GENERAL:  45 y.o.-year-old patient lying in the bed with no acute distress.  EYES: Pupils equal, round, reactive to light and accommodation. No scleral icterus. Extraocular muscles intact.  HEENT: Head atraumatic, normocephalic. Oropharynx and nasopharynx clear.  NECK:  Supple, no jugular venous distention. No thyroid enlargement, no tenderness.  LUNGS: Normal breath sounds bilaterally, no  wheezing, rales,rhonchi or crepitation. No use of accessory muscles of respiration.  CARDIOVASCULAR: S1, S2 normal. No murmurs, rubs, or gallops.  ABDOMEN: Soft, minimal tenderness, no guarding or rigidity, nondistended. Bowel sounds present. No organomegaly or mass.  EXTREMITIES: No pedal edema, cyanosis, or clubbing.  NEUROLOGIC: Cranial nerves II through XII are intact. Muscle strength 5/5 in all extremities. Sensation intact. Gait not checked.  PSYCHIATRIC: The patient is alert and oriented x 3.  SKIN: No obvious rash, lesion, or ulcer.    LABORATORY PANEL:   CBC  Recent Labs Lab 10/11/15 1015  WBC 10.1  HGB 14.3  HCT 40.4  PLT 265   ------------------------------------------------------------------------------------------------------------------  Chemistries   Recent Labs Lab 10/11/15 1015  NA 132*  K 4.4  CL 98*  CO2 24  GLUCOSE 103*  BUN 13  CREATININE 1.04  CALCIUM 9.8  AST 21  ALT 15*  ALKPHOS 84  BILITOT 0.8   ------------------------------------------------------------------------------------------------------------------  Cardiac Enzymes No results for input(s): TROPONINI in the last 168 hours. ------------------------------------------------------------------------------------------------------------------  RADIOLOGY:  No results found.  EKG:   Orders placed or performed during the hospital encounter of 08/07/15  . ED EKG within 10 minutes  . ED EKG within 10 minutes  . EKG 12-Lead  . EKG 12-Lead  . EKG    ASSESSMENT AND PLAN:   45y/o M with PMH of crohns disease, cervical radiculopathy, chronic Neck pain admitted with abdominal pain, nausea and vomiting and diarrhea.  #1 Acute Crohn's colitis flare- improving. -Appreciate GI consult. Started on twice a day steroids. -Continue antibiotics with IV Unasyn. -Symptoms are resolving. Advance diet slowly today. -Hasn't followed up with [facility] for more than a year. Outpatient follow-up  with [facility] set up.  #2 hyponatremia-secondary to dehydration. IV fluids ordered.  #3 tobacco use disorder-counseled against smoking.  #4 DVT prophylaxis-Lovenox.  Possible discharge tomorrow if symptoms have improved.    All the records are reviewed and case discussed with Care Management/Social Workerr. Management plans discussed with the patient, family and they are in agreement.  CODE STATUS: Full Code  TOTAL TIME TAKING CARE OF THIS PATIENT: 37 minutes.   POSSIBLE D/C  TOMORROW, DEPENDING ON CLINICAL CONDITION.   Gladstone Lighter M.D on 10/12/2015 at 12:53 PM  Between 7am to 6pm - Pager - [phone]  After 6pm go to [email or web address] - Proofreader  [facility]  Office  [phone]  CC: Primary care physician; Gennette Pac, FNP

## 2015-10-12 NOTE — Consult Note (Signed)
Patient ate 90% of his supper and has no vomiting or diarrhea.  He can likely go home tomorrow on low residue diet and 87m prednisone a day for a week then taper down 571mper week. He can follow up in our office to see one of our PA or NP.  Sed rate and CRP were normal but that can occur in about 25% of patients with IBD.

## 2015-10-13 ENCOUNTER — Telehealth: Payer: Self-pay

## 2015-10-13 ENCOUNTER — Telehealth: Payer: Self-pay | Admitting: *Deleted

## 2015-10-13 LAB — BASIC METABOLIC PANEL
Anion gap: 3 — ABNORMAL LOW (ref 5–15)
BUN: 11 mg/dL (ref 6–20)
CHLORIDE: 104 mmol/L (ref 101–111)
CO2: 26 mmol/L (ref 22–32)
CREATININE: 0.94 mg/dL (ref 0.61–1.24)
Calcium: 8.3 mg/dL — ABNORMAL LOW (ref 8.9–10.3)
GFR calc Af Amer: >60 mL/min (ref >60)
GFR calc non Af Amer: >60 mL/min (ref >60)
Glucose, Bld: 114 mg/dL — ABNORMAL HIGH (ref 65–99)
POTASSIUM: 4 mmol/L (ref 3.5–5.1)
SODIUM: 133 mmol/L — AB (ref 135–145)

## 2015-10-13 LAB — CBC
HCT: 34.1 % — ABNORMAL LOW (ref 40.0–52.0)
HEMOGLOBIN: 12 g/dL — AB (ref 13.0–18.0)
MCH: 33.2 pg (ref 26.0–34.0)
MCHC: 35.1 g/dL (ref 32.0–36.0)
MCV: 94.6 fL (ref 80.0–100.0)
Platelets: 226 10*3/uL (ref 150–440)
RBC: 3.6 MIL/uL — ABNORMAL LOW (ref 4.40–5.90)
RDW: 13 % (ref 11.5–14.5)
WBC: 11.1 10*3/uL — ABNORMAL HIGH (ref 3.8–10.6)

## 2015-10-13 MED ORDER — OXYCODONE-ACETAMINOPHEN 10-325 MG PO TABS: 1.0000 | ORAL_TABLET | Freq: Two times a day (BID) | ORAL | 0 refills | Status: DC

## 2015-10-13 MED ORDER — PREDNISONE 20 MG PO TABS
40.0000 mg | ORAL_TABLET | Freq: Every day | ORAL | Status: DC
  Administered 2015-10-13: 40 mg via ORAL
  Filled 2015-10-13: qty 2

## 2015-10-13 MED ORDER — NICOTINE 14 MG/24HR TD PT24: 14.0000 mg | MEDICATED_PATCH | Freq: Every day | TRANSDERMAL | 0 refills | Status: DC

## 2015-10-13 MED ORDER — METRONIDAZOLE 500 MG PO TABS: 500.0000 mg | ORAL_TABLET | Freq: Three times a day (TID) | ORAL | 0 refills | Status: DC

## 2015-10-13 MED ORDER — CIPROFLOXACIN HCL 500 MG PO TABS: 500.0000 mg | ORAL_TABLET | Freq: Two times a day (BID) | ORAL | 0 refills | Status: DC

## 2015-10-13 MED ORDER — PREDNISONE 10 MG PO TABS: ORAL_TABLET | ORAL | 0 refills | Status: DC

## 2015-10-13 NOTE — Telephone Encounter (Signed)
Patient wants [place] to call his pcp and let her know the situation regarding Dr. Andree Elk being out of town.  Martyn Ehrich is his pcp [phone] He wants to be called once this has been done so he will know what to do.

## 2015-10-13 NOTE — Discharge Instructions (Signed)
Crohn Disease Crohn disease is a long-lasting (chronic) disease that affects your gastrointestinal (GI) tract. It often causes irritation and swelling (inflammation) in your small intestine and the beginning of your large intestine. However, it can affect any part of your GI tract. Crohn disease is part of a group of illnesses that are known as inflammatory bowel disease (IBD). Crohn disease may start slowly and get worse over time. Symptoms may come and go. They may also disappear for months or even years at a time (remission). CAUSES The exact cause of Crohn disease is not known. It may be a response that causes your body's defense system (immune system) to mistakenly attack healthy cells and tissues (autoimmune response). Your genes and your environment may also play a role. RISK FACTORS You may be at greater risk for Crohn disease if you:  Have other family members with Crohn disease or another IBD.  Use any tobacco products, including cigarettes, chewing tobacco, or electronic cigarettes.  Are in your 25s.  Have [place] European ancestry. SIGNS AND SYMPTOMS The main signs and symptoms of Crohn disease involve your GI tract. These include:  Diarrhea.  Rectal bleeding.  An urgent need to move your bowels.  The feeling that you are not finished having a bowel movement.  Abdominal pain or cramping.  Constipation. General signs and symptoms of Crohn disease may also include:  Unexplained weight loss.  Fatigue.  Fever.  Nausea.  Loss of appetite.  Joint pain  Changes in vision.  Red bumps on your skin. DIAGNOSIS Your health care provider may suspect Crohn disease based on your symptoms and your medical history. Your health care provider will do a physical exam. You may need to see a health care provider who specializes in diseases of the digestive tract (gastroenterologist). You may also have tests to help your health care providers make a diagnosis. These may  include:  Blood tests.  Stool sample tests.  Imaging tests, such as X-rays and CT scans.  Tests to examine the inside of your intestines using a long, flexible tube that has a light and a camera on the end (endoscopy or colonoscopy).  A procedure to take tissue samples from inside your bowel (biopsy) to be examined under a microscope. TREATMENT  There is no cure for Crohn disease. Treatment will focus on managing your symptoms. Crohn disease affects each person differently. Your treatment may include:  Resting your bowels. Drinking only clear liquids or getting nutrition through an IV for a period of time gives your bowels a chance to heal because they are not passing stools.  Medicines. These may be used alone or in combination (combination therapy). These may include antibiotic medicines. You may be given medicines that help to:  Reduce inflammation.  Control your immune system activity.  Fight infections.  Relieve cramps and prevent diarrhea.  Control your pain.  Surgery. You may need surgery if:  Medicines and other treatments are no longer working.  You develop complications from severe Crohn disease.  A section of your intestine becomes so damaged that it needs to be removed. HOME CARE INSTRUCTIONS  Take medicines only as directed by your health care provider.  If you were prescribed an antibiotic medicine, finish it all even if you start to feel better.  Keep all follow-up visits as directed by your health care provider. This is important.  Talk with your health care provider about changing your diet. This may help your symptoms. Your health care provide may recommend changes, such  as:  Drinking more fluids.  Avoiding milk and other foods that contain lactose.  Eating a low-fat diet.  Avoiding high-fiber foods, such as popcorn and nuts.  Avoiding carbonated beverages, such as soda.  Eating smaller meals more often rather than eating large  meals.  Keeping a food diary to identify foods that make your symptoms better or worse.  Do not use any tobacco products, including cigarettes, chewing tobacco, or electronic cigarettes. If you need help quitting, ask your health care provider.  Limit alcohol intake to no more than 1 drink per day for nonpregnant women and 2 drinks per day for men. One drink equals 12 ounces of beer, 5 ounces of wine, or 1 ounces of hard liquor.  Exercise daily or as directed by your health care provider. SEEK MEDICAL CARE IF:  You have diarrhea, abdominal cramps, and other gastrointestinal problems that are present almost all of the time.  Your symptoms do not improve with treatment.  You continue to lose weight.  You develop a rash or sores on your skin.  You develop eye problems.  You have a fever.   Your symptoms get worse.  You develop new symptoms. SEEK IMMEDIATE MEDICAL CARE IF:  You have bloody diarrhea.  You develop severe abdominal pain.  You cannot pass stools.   This information is not intended to replace advice given to you by your health care provider. Make sure you discuss any questions you have with your health care provider.   Document Released: 10/24/2004 Document Revised: 02/04/2014 Document Reviewed: 09/01/2013 Elsevier Interactive Patient Education [employer].

## 2015-10-13 NOTE — Progress Notes (Signed)
Patient ID: EMILY FORSE, male   DOB: 08-18-1970, 45 y.o.   MRN: [id number] [place] at [facility] was admitted to the Hospital on 10/11/2015 and Discharged  10/13/2015 and should be excused from work/school   for 3  days starting 10/11/2015 , may return to work/school without any restrictions.  Loletha Grayer M.D on 10/13/2015,at 9:50 AM  [place] at [place]

## 2015-10-13 NOTE — Progress Notes (Signed)
Received MD order to discharge patient to home, reviewed home meds, prescriptions and discharge instructions with patient and patient verbalized understanding discharged to home in wheelchair with auxillary

## 2015-10-13 NOTE — Discharge Summary (Signed)
[place] [facility] [place] NAME: Benjamin Murillo    MR#:  [id number]  DATE OF BIRTH:  [birth year]  DATE OF ADMISSION:  10/11/2015 ADMITTING PHYSICIAN: Fritzi Mandes, MD  DATE OF DISCHARGE: 10/13/2015 10:00 AM  PRIMARY CARE PHYSICIAN: Gennette Pac, FNP    ADMISSION DIAGNOSIS:  Crohn's disease without complication, unspecified gastrointestinal tract location ([place]) [K50.90]  DISCHARGE DIAGNOSIS:  Active Problems:   Crohn's colitis ([place])   SECONDARY DIAGNOSIS:   Past Medical History:  Diagnosis Date  . Cervical radiculopathy   . Chronic abdominal pain   . Chronic neck pain   . Cold    2 weeks ago with cough  still   . Crohn disease ([place])   . GERD (gastroesophageal reflux disease)   . Hand fracture, left   . Headache(784.0)   . Hypertension    no meds   . Shortness of breath    "sometimes laying down; sometimes w/activity" (10/13/2012)    HOSPITAL COURSE:   1. Acute Crohn's flare. Patient feels much better. Patient was started on steroids and antibiotics. I will prescribe a week's worth total of Cipro and Flagyl. I will switch Solu-Medrol over to a prednisone taper. Patient tolerating advanced diet. Follow-up with Dr. Vira Agar gastroenterology as outpatient. 2. Hyponatremia secondary to dehydration. 3. Tobacco abuse. Nicotine patch prescribed 4. Chronic pain. I did prescribe 5 days worth of pain medication just in case he cannot get to his pain management doctor this morning.  DISCHARGE CONDITIONS:   Satisfactory  CONSULTS OBTAINED:  Treatment Team:  Manya Silvas, MD  DRUG ALLERGIES:   Allergies  Allergen Reactions  . Vicodin [Hydrocodone-Acetaminophen] Other (See Comments)    anxious  . Tramadol Other (See Comments)    Makes patient anxious and nervous.  . Morphine And Related Itching    High doses    DISCHARGE MEDICATIONS:   Discharge Medication List as of 10/13/2015  9:03 AM    START taking these medications   Details  ciprofloxacin (CIPRO) 500 MG tablet Take 1 tablet (500 mg total) by mouth 2 (two) times daily., Starting Fri 10/13/2015, Print    metroNIDAZOLE (FLAGYL) 500 MG tablet Take 1 tablet (500 mg total) by mouth every 8 (eight) hours., Starting Fri 10/13/2015, Print    nicotine (NICODERM CQ - DOSED IN MG/24 HOURS) 14 mg/24hr patch Place 1 patch (14 mg total) onto the skin daily., Starting Fri 10/13/2015, Print      CONTINUE these medications which have CHANGED   Details  oxyCODONE-acetaminophen (PERCOCET) 10-325 MG tablet Take 1 tablet by mouth 2 (two) times daily with a meal., Starting Fri 10/13/2015, Print    predniSONE (DELTASONE) 10 MG tablet 4 tabs po day1; 3 tabs po day2; 2 tabs po day3; 1 tab po day4,5; 1/2 tab po day6,7, Print      CONTINUE these medications which have NOT CHANGED   Details  HUMIRA PEN 40 MG/0.8ML injection Inject 40 mg as directed every 14 (fourteen) days. , Starting Mon 03/22/2013, Historical Med      STOP taking these medications     ibuprofen (ADVIL,MOTRIN) 800 MG tablet          DISCHARGE INSTRUCTIONS:   Follow-up with pain management doctor soon as possible Follow-up with Dr. Vira Agar gastroenterology 1-2 weeks Follow-up with your medical doctor one week  If you experience worsening of your admission symptoms, develop shortness of breath, life threatening emergency, suicidal or homicidal thoughts you must seek medical attention immediately by calling  911 or calling your MD immediately  if symptoms less severe.  You Must read complete instructions/literature along with all the possible adverse reactions/side effects for all the Medicines you take and that have been prescribed to you. Take any new Medicines after you have completely understood and accept all the possible adverse reactions/side effects.   Please note  You were cared for by a hospitalist during your hospital stay. If you have any questions about your discharge medications or the care you  received while you were in the hospital after you are discharged, you can call the unit and asked to speak with the hospitalist on call if the hospitalist that took care of you is not available. Once you are discharged, your primary care physician will handle any further medical issues. Please note that NO REFILLS for any discharge medications will be authorized once you are discharged, as it is imperative that you return to your primary care physician (or establish a relationship with a primary care physician if you do not have one) for your aftercare needs so that they can reassess your need for medications and monitor your lab values.    Today   CHIEF COMPLAINT:   Chief Complaint  Patient presents with  . Abdominal Pain    HISTORY OF PRESENT ILLNESS:  Benjamin Murillo  is a 45 y.o. male with a known history of Crohn's disease presents with abdominal pain   VITAL SIGNS:  Blood pressure (!) 143/84, pulse 61, temperature 98.3 F (36.8 C), temperature source Oral, resp. rate 16, height 5' 11"  (1.803 m), weight 63.5 kg (140 lb), SpO2 100 %.    PHYSICAL EXAMINATION:  GENERAL:  45 y.o.-year-old patient lying in the bed with no acute distress.  EYES: Pupils equal, round, reactive to light and accommodation. No scleral icterus. Extraocular muscles intact.  HEENT: Head atraumatic, normocephalic. Oropharynx and nasopharynx clear.  NECK:  Supple, no jugular venous distention. No thyroid enlargement, no tenderness.  LUNGS: Normal breath sounds bilaterally, no wheezing, rales,rhonchi or crepitation. No use of accessory muscles of respiration.  CARDIOVASCULAR: S1, S2 normal. No murmurs, rubs, or gallops.  ABDOMEN: Soft, non-tender, non-distended. Bowel sounds present. No organomegaly or mass.  EXTREMITIES: No pedal edema, cyanosis, or clubbing.  NEUROLOGIC: Cranial nerves II through XII are intact. Muscle strength 5/5 in all extremities. Sensation intact. Gait not checked.  PSYCHIATRIC: The  patient is alert and oriented x 3.  SKIN: No obvious rash, lesion, or ulcer.   DATA REVIEW:   CBC  Recent Labs Lab 10/13/15 0642  WBC 11.1*  HGB 12.0*  HCT 34.1*  PLT 226    Chemistries   Recent Labs Lab 10/11/15 1015 10/13/15 0642  NA 132* 133*  K 4.4 4.0  CL 98* 104  CO2 24 26  GLUCOSE 103* 114*  BUN 13 11  CREATININE 1.04 0.94  CALCIUM 9.8 8.3*  AST 21  --   ALT 15*  --   ALKPHOS 84  --   BILITOT 0.8  --      Microbiology Results  Results for orders placed or performed during the hospital encounter of 02/10/13  Surgical pcr screen     Status: None   Collection Time: 02/10/13  1:19 PM  Result Value Ref Range Status   MRSA, PCR NEGATIVE NEGATIVE Final   Staphylococcus aureus NEGATIVE NEGATIVE Final    Comment:        The Xpert SA Assay (FDA approved for NASAL specimens in patients over 43 years of age),  is one component of a comprehensive surveillance program.  Test performance has been validated by [facility] for patients greater than or equal to 45 year old. It is not intended to diagnose infection nor to guide or monitor treatment.     Management plans discussed with the patient, family and they are in agreement.  CODE STATUS:  Code Status History    Date Active Date Inactive Code Status Order ID Comments User Context   10/11/2015  4:16 PM 10/13/2015  2:08 PM Full Code [id number]  Fritzi Mandes, MD Inpatient   01/03/2014  9:53 PM 01/06/2014  2:23 PM Full Code [id number]  Shanda Howells, MD ED   02/12/2013 10:15 PM 02/13/2013  2:48 PM Full Code [id number]  Ashok Pall, MD Inpatient   10/13/2012 10:51 AM 10/15/2012 11:20 PM Full Code [id number]  Thurnell Lose, MD Inpatient   06/06/2012  9:43 PM 06/08/2012  2:12 PM Full Code [id number]  Jonetta Osgood, MD Inpatient      TOTAL TIME TAKING CARE OF THIS PATIENT: 35 minutes.    Loletha Grayer M.D on 10/13/2015 at 4:53 PM  Between 7am to 6pm - Pager - [phone]  After 6pm go to [email or web address] -  password [email or web address]  [facility] Office  [phone]  CC: Primary care physician; Gennette Pac, FNP

## 2015-10-13 NOTE — Telephone Encounter (Signed)
Patient called and instructed that he will need to call his PCP and see if they will cover him for one week until Dr. Andree Elk returns from vacation.

## 2015-10-13 NOTE — Telephone Encounter (Signed)
He missed his appointment on 9-14 for med refill due to being in the hospital. He is out of meds. Dr. Andree Elk does not have an opening until 10-9. What can he do about getting his medicine?

## 2015-10-23 ENCOUNTER — Encounter: Payer: Self-pay | Admitting: Anesthesiology

## 2015-10-23 ENCOUNTER — Telehealth: Payer: Self-pay | Admitting: Anesthesiology

## 2015-10-23 ENCOUNTER — Ambulatory Visit: Payer: Medicare Other | Attending: Anesthesiology | Admitting: Anesthesiology

## 2015-10-23 VITALS — BP 155/91 | HR 99 | Temp 98.4°F | Resp 16 | Ht 71.0 in | Wt 130.0 lb

## 2015-10-23 DIAGNOSIS — Z9049 Acquired absence of other specified parts of digestive tract: Secondary | ICD-10-CM | POA: Insufficient documentation

## 2015-10-23 DIAGNOSIS — Z9889 Other specified postprocedural states: Secondary | ICD-10-CM | POA: Insufficient documentation

## 2015-10-23 DIAGNOSIS — G8929 Other chronic pain: Secondary | ICD-10-CM | POA: Diagnosis not present

## 2015-10-23 DIAGNOSIS — M545 Low back pain, unspecified: Secondary | ICD-10-CM

## 2015-10-23 DIAGNOSIS — R1084 Generalized abdominal pain: Secondary | ICD-10-CM | POA: Diagnosis not present

## 2015-10-23 DIAGNOSIS — K219 Gastro-esophageal reflux disease without esophagitis: Secondary | ICD-10-CM | POA: Insufficient documentation

## 2015-10-23 DIAGNOSIS — M5412 Radiculopathy, cervical region: Secondary | ICD-10-CM | POA: Insufficient documentation

## 2015-10-23 DIAGNOSIS — M549 Dorsalgia, unspecified: Secondary | ICD-10-CM

## 2015-10-23 DIAGNOSIS — K5090 Crohn's disease, unspecified, without complications: Secondary | ICD-10-CM | POA: Insufficient documentation

## 2015-10-23 DIAGNOSIS — Z87891 Personal history of nicotine dependence: Secondary | ICD-10-CM | POA: Diagnosis not present

## 2015-10-23 DIAGNOSIS — I10 Essential (primary) hypertension: Secondary | ICD-10-CM | POA: Insufficient documentation

## 2015-10-23 DIAGNOSIS — R109 Unspecified abdominal pain: Secondary | ICD-10-CM | POA: Insufficient documentation

## 2015-10-23 DIAGNOSIS — M542 Cervicalgia: Secondary | ICD-10-CM | POA: Diagnosis not present

## 2015-10-23 MED ORDER — OXYCODONE-ACETAMINOPHEN 10-325 MG PO TABS: 1.0000 | ORAL_TABLET | Freq: Two times a day (BID) | ORAL | 0 refills | Status: DC

## 2015-10-23 NOTE — Telephone Encounter (Signed)
patient to come in today for med refill. Ok per Dr. Andree Elk.

## 2015-10-23 NOTE — Progress Notes (Signed)
Safety precautions to be maintained throughout the outpatient stay will include: orient to surroundings, keep bed in low position, maintain call bell within reach at all times, provide assistance with transfer out of bed and ambulation.  Recently in hospital 9/13/ thru 10/13/15 [place] flare up

## 2015-10-23 NOTE — Telephone Encounter (Signed)
Called regarding medications, he is out today, had to miss last appt due to being in the hospital, says he spoke with nurse and was told to call back today?  Please call patient about meds refill

## 2015-10-24 NOTE — Progress Notes (Signed)
Subjective:  Patient ID: Benjamin Murillo, male    DOB: [birth year]  Age: 45 y.o. MRN: [id number]  CC: Abdominal Pain  Procedure: None  HPI KHYE HOCHSTETLER presents for a repeat evaluation. He has a long-standing history of diffuse body pain and abdominal pain. He also has chronic low back pain. In addition to previous neck surgery with a 2 level fusion and chronic neck pain. He describes a pain in his neck and abdomen generally up around a VAS of 10 and a minimum of 8.He was recently admitted for an exacerbation of his Crohn's disease. He is currently being followed by Dr. Anastasio Champion and do for follow-up evaluation with him seen. Currently he mentions that his abdominal pain has improved and he still taking medications as prescribed denying any diverting or illicit use. Medications keep his pain under reasonable control based on his narcotic assessment sheet today. Furthermore his back pain and neck pain also have been stable with no change in symptom complex.   History Dam[place] has a past medical history of Cervical radiculopathy; Chronic abdominal pain; Chronic neck pain; Cold; Crohn disease ([place]); GERD (gastroesophageal reflux disease); Hand fracture, left; Headache(784.0); Hypertension; and Shortness of breath.   He has a past surgical history that includes Bowel resection; Nevus excision; Colon surgery; and Anterior cervical decomp/discectomy fusion (N/A, 02/12/2013).   His family history includes Cancer in his maternal aunt; Diabetes in his paternal aunt; Hyperlipidemia in his mother.He reports that he quit smoking about 4 months ago. His smoking use included Cigarettes. He has a 2.20 pack-year smoking history. He has never used smokeless tobacco. He reports that he does not drink alcohol or use drugs.   ---------------------------------------------------------------------------------------------------------------------- Past Medical History:  Diagnosis Date  . Cervical radiculopathy   .  Chronic abdominal pain   . Chronic neck pain   . Cold    2 weeks ago with cough  still   . Crohn disease ([place])   . GERD (gastroesophageal reflux disease)   . Hand fracture, left   . Headache(784.0)   . Hypertension    no meds   . Shortness of breath    "sometimes laying down; sometimes w/activity" (10/13/2012)    Past Surgical History:  Procedure Laterality Date  . ANTERIOR CERVICAL DECOMP/DISCECTOMY FUSION N/A 02/12/2013   Procedure: ANTERIOR CERVICAL DECOMPRESSION/DISCECTOMY FUSION 1 LEVEL;  Surgeon: Winfield Cunas, MD;  Location: [facility];  Service: Neurosurgery;  Laterality: N/A;  C67 anterior cervical decompression with fusion plating and bonegraft  . BOWEL RESECTION     x2  . COLON SURGERY     drains placed to drain cyst  . NEVUS EXCISION     left arm + hand    Family History  Problem Relation Age of Onset  . Hyperlipidemia Mother   . Cancer Maternal Aunt   . Diabetes Paternal Aunt     Social History  Substance Use Topics  . Smoking status: Former Smoker    Packs/day: 0.10    Years: 22.00    Types: Cigarettes    Quit date: 06/05/2015  . Smokeless tobacco: Never Used     Comment: fMJ  06/05/15- trying to get out of smokes  . Alcohol use No    ---------------------------------------------------------------------------------------------------------------------- Social History   Social History  . Marital status: Legally Separated    Spouse name: N/A  . Number of children: N/A  . Years of education: N/A   Social History Main Topics  . Smoking status: Former Smoker  Packs/day: 0.10    Years: 22.00    Types: Cigarettes    Quit date: 06/05/2015  . Smokeless tobacco: Never Used     Comment: fMJ  06/05/15- trying to get out of smokes  . Alcohol use No  . Drug use: No  . Sexual activity: Not Currently     Comment: stopped smoking MJ 06/05/15   Other Topics Concern  . None   Social History Narrative  . None       ----------------------------------------------------------------------------------------------------------------------  ROS Review of Systems    Objective:  BP (!) 155/91 (Patient Position: Sitting)   Pulse 99   Temp 98.4 F (36.9 C) (Oral)   Resp 16   Ht 5' 11"  (1.803 m)   Wt 130 lb (59 kg)   SpO2 100%   BMI 18.13 kg/m   Physical Exam  Patient is good historian cooperative and compliant Pupils are equally round reactive to light extraocular muscles are intact Heart is regular rate and rhythm without murmur  Assessment & Plan:   Costas was seen today for abdominal pain.  Diagnoses and all orders for this visit:  Generalized abdominal pain  Bilateral low back pain without sciatica  Chronic neck pain  Chronic neck and back pain  Other orders -     Discontinue: oxyCODONE-acetaminophen (PERCOCET) 10-325 MG tablet; Take 1 tablet by mouth 2 (two) times daily with a meal. -     Discontinue: oxyCODONE-acetaminophen (PERCOCET) 10-325 MG tablet; Take 1 tablet by mouth 2 (two) times daily with a meal. -     Discontinue: oxyCODONE-acetaminophen (PERCOCET) 10-325 MG tablet; Take 1 tablet by mouth 2 (two) times daily with a meal. -     oxyCODONE-acetaminophen (PERCOCET) 10-325 MG tablet; Take 1 tablet by mouth 2 (two) times daily with a meal.     ----------------------------------------------------------------------------------------------------------------------  Problem List Items Addressed This Visit      Other   AP (abdominal pain) - Primary    Other Visit Diagnoses    Bilateral low back pain without sciatica       Relevant Medications   oxyCODONE-acetaminophen (PERCOCET) 10-325 MG tablet   Chronic neck pain       Relevant Medications   oxyCODONE-acetaminophen (PERCOCET) 10-325 MG tablet   Chronic neck and back pain       Relevant Medications   oxyCODONE-acetaminophen (PERCOCET) 10-325 MG tablet       ----------------------------------------------------------------------------------------------------------------------  1. Generalized abdominal pain Is taking Percocet tablets 10 mg twice a day for good relief and denies any diverting or illicit use with these medicines. We will refill his medications for the next month with return to clinic in 2 months He is to continue follow-up with his GI doctors   2. Bilateral low back pain without sciatica He may be a candidate for a facet block   3. Chronic neck pain Continue follow-up with his with neck surgeon as indicated   4. Chronic neck and back pain We may start him on Percocet for chronic pain management is also instructed to use NSAIDs such as Naprosyn 290m  twice a day 5 days per week if okay with his GI doctors, prn -     ----------------------------------------------------------------------------------------------------------------------  I am having Mr. MKnopemaintain his HUMIRA PEN, predniSONE, ciprofloxacin, metroNIDAZOLE, nicotine, and oxyCODONE-acetaminophen.   Meds ordered this encounter  Medications  . DISCONTD: oxyCODONE-acetaminophen (PERCOCET) 10-325 MG tablet    Sig: Take 1 tablet by mouth 2 (two) times daily with a meal.    Dispense:  10  tablet    Refill:  0  . DISCONTD: oxyCODONE-acetaminophen (PERCOCET) 10-325 MG tablet    Sig: Take 1 tablet by mouth 2 (two) times daily with a meal.    Dispense:  10 tablet    Refill:  0    Do not fill until [id number]  . DISCONTD: oxyCODONE-acetaminophen (PERCOCET) 10-325 MG tablet    Sig: Take 1 tablet by mouth 2 (two) times daily with a meal.    Dispense:  60 tablet    Refill:  0  . oxyCODONE-acetaminophen (PERCOCET) 10-325 MG tablet    Sig: Take 1 tablet by mouth 2 (two) times daily with a meal.    Dispense:  60 tablet    Refill:  0    Do not fill until [id number]       Follow-up: Return in about 2 months (around 12/23/2015) for evaluation, med refill.     [name], MD

## 2015-11-09 ENCOUNTER — Encounter: Payer: Self-pay | Admitting: *Deleted

## 2015-11-13 ENCOUNTER — Encounter
Admission: RE | Disposition: A | Discharge status: Home / Self Care | Payer: Self-pay | Source: Ambulatory Visit | Attending: Unknown Physician Specialty

## 2015-11-13 ENCOUNTER — Encounter: Payer: Self-pay | Admitting: *Deleted

## 2015-11-13 ENCOUNTER — Ambulatory Visit
Admission: RE | Admit: 2015-11-13 | Discharge: 2015-11-13 | Disposition: A | Discharge status: Home / Self Care | Payer: Medicare Other | Source: Ambulatory Visit | Attending: Unknown Physician Specialty | Admitting: Unknown Physician Specialty

## 2015-11-13 ENCOUNTER — Ambulatory Visit: Payer: Medicare Other | Admitting: Anesthesiology

## 2015-11-13 DIAGNOSIS — K219 Gastro-esophageal reflux disease without esophagitis: Secondary | ICD-10-CM | POA: Insufficient documentation

## 2015-11-13 DIAGNOSIS — K648 Other hemorrhoids: Secondary | ICD-10-CM | POA: Diagnosis not present

## 2015-11-13 DIAGNOSIS — Z87891 Personal history of nicotine dependence: Secondary | ICD-10-CM | POA: Diagnosis not present

## 2015-11-13 DIAGNOSIS — K2970 Gastritis, unspecified, without bleeding: Secondary | ICD-10-CM | POA: Diagnosis not present

## 2015-11-13 DIAGNOSIS — K5000 Crohn's disease of small intestine without complications: Secondary | ICD-10-CM | POA: Insufficient documentation

## 2015-11-13 DIAGNOSIS — K449 Diaphragmatic hernia without obstruction or gangrene: Secondary | ICD-10-CM | POA: Insufficient documentation

## 2015-11-13 DIAGNOSIS — I10 Essential (primary) hypertension: Secondary | ICD-10-CM | POA: Diagnosis not present

## 2015-11-13 HISTORY — PX: ESOPHAGOGASTRODUODENOSCOPY (EGD) WITH PROPOFOL: SHX5813

## 2015-11-13 HISTORY — PX: COLONOSCOPY WITH PROPOFOL: SHX5780

## 2015-11-13 HISTORY — DX: Irritable bowel syndrome, unspecified: K58.9

## 2015-11-13 SURGERY — COLONOSCOPY WITH PROPOFOL
Anesthesia: General

## 2015-11-13 MED ORDER — FENTANYL CITRATE (PF) 100 MCG/2ML IJ SOLN
INTRAMUSCULAR | Status: DC | PRN
  Administered 2015-11-13: 50 ug via INTRAVENOUS

## 2015-11-13 MED ORDER — SODIUM CHLORIDE 0.9 % IV SOLN
INTRAVENOUS | Status: DC
  Administered 2015-11-13: 1000 mL via INTRAVENOUS

## 2015-11-13 MED ORDER — MIDAZOLAM HCL 2 MG/2ML IJ SOLN
INTRAMUSCULAR | Status: DC | PRN
  Administered 2015-11-13: 1 mg via INTRAVENOUS

## 2015-11-13 MED ORDER — LIDOCAINE HCL (PF) 1 % IJ SOLN
INTRAMUSCULAR | Status: AC
  Administered 2015-11-13: 0.5 mg
  Filled 2015-11-13: qty 2

## 2015-11-13 MED ORDER — PROPOFOL 10 MG/ML IV BOLUS
INTRAVENOUS | Status: DC | PRN
  Administered 2015-11-13: 120 mg via INTRAVENOUS

## 2015-11-13 MED ORDER — SODIUM CHLORIDE 0.9 % IV SOLN: INTRAVENOUS | Status: DC

## 2015-11-13 MED ORDER — PROPOFOL 500 MG/50ML IV EMUL
INTRAVENOUS | Status: DC | PRN
  Administered 2015-11-13: 120 ug/kg/min via INTRAVENOUS

## 2015-11-13 NOTE — H&P (Signed)
Primary Care Physician:  Gennette Pac, FNP Primary Gastroenterologist:  Dr. Vira Agar  Pre-Procedure History & Physical: HPI:  Benjamin Murillo is a 45 y.o. male is here for an colonoscopy.   Past Medical History:  Diagnosis Date  . Cervical radiculopathy   . Chronic abdominal pain   . Chronic neck pain   . Cold    2 weeks ago with cough  still   . Crohn disease ([place])   . GERD (gastroesophageal reflux disease)   . Hand fracture, left   . Headache(784.0)   . Hypertension    no meds   . IBS (irritable bowel syndrome)   . Shortness of breath    "sometimes laying down; sometimes w/activity" (10/13/2012)    Past Surgical History:  Procedure Laterality Date  . ANTERIOR CERVICAL DECOMP/DISCECTOMY FUSION N/A 02/12/2013   Procedure: ANTERIOR CERVICAL DECOMPRESSION/DISCECTOMY FUSION 1 LEVEL;  Surgeon: Winfield Cunas, MD;  Location: [place] [facility];  Service: Neurosurgery;  Laterality: N/A;  C67 anterior cervical decompression with fusion plating and bonegraft  . BOWEL RESECTION     x2  . COLON SURGERY     drains placed to drain cyst  . COLONOSCOPY    . NEVUS EXCISION     left arm + hand    Prior to Admission medications   Medication Sig Start Date End Date Taking? Authorizing Provider  HYDROcodone-acetaminophen (NORCO/VICODIN) 5-325 MG tablet Take 1 tablet by mouth every 6 (six) hours as needed for moderate pain.   Yes Historical Provider, MD  ciprofloxacin (CIPRO) 500 MG tablet Take 1 tablet (500 mg total) by mouth 2 (two) times daily. Patient not taking: Reported on 10/23/2015 10/13/15   Loletha Grayer, MD  HUMIRA PEN 40 MG/0.8ML injection Inject 40 mg as directed every 14 (fourteen) days.  03/22/13   Historical Provider, MD  metroNIDAZOLE (FLAGYL) 500 MG tablet Take 1 tablet (500 mg total) by mouth every 8 (eight) hours. 10/13/15   Loletha Grayer, MD  nicotine (NICODERM CQ - DOSED IN MG/24 HOURS) 14 mg/24hr patch Place 1 patch (14 mg total) onto the skin daily. 10/13/15   Loletha Grayer, MD  oxyCODONE-acetaminophen (PERCOCET) 10-325 MG tablet Take 1 tablet by mouth 2 (two) times daily with a meal. 10/23/15   [name], MD  predniSONE (DELTASONE) 10 MG tablet 4 tabs po day1; 3 tabs po day2; 2 tabs po day3; 1 tab po day4,5; 1/2 tab po day6,7 10/13/15   Loletha Grayer, MD    Allergies as of 11/02/2015 - Review Complete 10/23/2015  Allergen Reaction Noted  . Vicodin [hydrocodone-acetaminophen] Other (See Comments) 10/17/2012  . Tramadol Other (See Comments) 05/19/2012  . Morphine and related Itching 09/27/2011    Family History  Problem Relation Age of Onset  . Hyperlipidemia Mother   . Cancer Maternal Aunt   . Diabetes Paternal Aunt     Social History   Social History  . Marital status: Legally Separated    Spouse name: N/A  . Number of children: N/A  . Years of education: N/A   Occupational History  . Not on file.   Social History Main Topics  . Smoking status: Former Smoker    Packs/day: 0.10    Years: 22.00    Types: Cigarettes    Quit date: 06/05/2015  . Smokeless tobacco: Never Used     Comment: fMJ  06/05/15- trying to get out of smokes  . Alcohol use No  . Drug use: No  . Sexual activity: Not Currently  Comment: stopped smoking MJ 06/05/15   Other Topics Concern  . Not on file   Social History Narrative  . No narrative on file    Review of Systems: See HPI, otherwise negative ROS  Physical Exam: BP 125/83   Temp 97.1 F (36.2 C) (Tympanic)   Resp 16   Ht 5' 11"  (1.803 m)   SpO2 100%  General:   Alert,  pleasant and cooperative in NAD Head:  Normocephalic and atraumatic. Neck:  Supple; no masses or thyromegaly. Lungs:  Clear throughout to auscultation.    Heart:  Regular rate and rhythm. Abdomen:  Soft, nontender and nondistended. Normal bowel sounds, without guarding, and without rebound.   Neurologic:  Alert and  oriented x4;  grossly normal neurologically.  Impression/Plan: Benjamin Murillo is here for an colonoscopy  to be performed for Crohn's disease.  Risks, benefits, limitations, and alternatives regarding  colonoscopy have been reviewed with the patient.  Questions have been answered.  All parties agreeable.   Gaylyn Cheers, MD  11/13/2015, 3:00 PM

## 2015-11-13 NOTE — Op Note (Signed)
[facility] Gastroenterology Patient Name: Benjamin Murillo Procedure Date: 11/13/2015 3:06 PM MRN: [id number] Account #: [id number] Date of Birth: Aug 28, 1970 Admit Type: Outpatient Age: 45 Room: [facility] ENDO ROOM 4 Gender: Male Note Status: Finalized Procedure:            Colonoscopy Indications:          Abnormal CT of the GI tract, Follow-up of Crohn's                        disease of the small bowel Providers:            Manya Silvas, MD Medicines:            Propofol per Anesthesia Complications:        No immediate complications. Procedure:            Pre-Anesthesia Assessment:                       - After reviewing the risks and benefits, the patient                        was deemed in satisfactory condition to undergo the                        procedure.                       After obtaining informed consent, the colonoscope was                        passed under direct vision. Throughout the procedure,                        the patient's blood pressure, pulse, and oxygen                        saturations were monitored continuously. The                        Colonoscope was introduced through the anus and                        advanced to the the terminal ileum. The colonoscopy was                        performed without difficulty. The patient tolerated the                        procedure well. The quality of the bowel preparation                        was excellent. Findings:      Internal hemorrhoids were found during endoscopy. The hemorrhoids were       small.      Some colonic mucosa showed scatterred erythematous faint spots of no       consequence.      A diffuse area of mucosa in the terminal ileum was severely friable with       no bleeding. This was biopsied twice with a cold forceps for histology.       I entered the terminal ileum for a distance of  about 10cm where the       mucosa was normal in apperance. The mucosa was very  inflammed. Impression:           - Internal hemorrhoids.                       - Friability with no bleeding in the terminal ileum.                        Biopsied. Recommendation:       - Await pathology results. Manya Silvas, MD 11/13/2015 3:47:13 PM This report has been signed electronically. Number of Addenda: 0 Note Initiated On: 11/13/2015 3:06 PM Scope Withdrawal Time: 0 hours 8 minutes 3 seconds  Total Procedure Duration: 0 hours 16 minutes 31 seconds       [facility]

## 2015-11-13 NOTE — Anesthesia Preprocedure Evaluation (Signed)
Anesthesia Evaluation  Patient identified by MRN, date of birth, ID band Patient awake    Reviewed: Allergy & Precautions, H&P , NPO status , Patient's Chart, lab work & pertinent test results, reviewed documented beta blocker date and time   History of Anesthesia Complications (+) AWARENESS UNDER ANESTHESIA and history of anesthetic complications  Airway Mallampati: I  TM Distance: >3 FB Neck ROM: full    Dental no notable dental hx. (+) Missing   Pulmonary neg shortness of breath, neg sleep apnea, neg COPD, neg recent URI, Current Smoker,    Pulmonary exam normal breath sounds clear to auscultation       Cardiovascular Exercise Tolerance: Good hypertension, (-) angina(-) CAD, (-) Past MI, (-) Cardiac Stents and (-) CABG Normal cardiovascular exam(-) dysrhythmias (-) Valvular Problems/Murmurs Rhythm:regular Rate:Normal     Neuro/Psych negative neurological ROS  negative psych ROS   GI/Hepatic Neg liver ROS, GERD  ,Crohn's Disease   Endo/Other  negative endocrine ROS  Renal/GU negative Renal ROS  negative genitourinary   Musculoskeletal   Abdominal   Peds  Hematology negative hematology ROS (+)   Anesthesia Other Findings Past Medical History: No date: Cervical radiculopathy No date: Chronic abdominal pain No date: Chronic neck pain No date: Cold     Comment: 2 weeks ago with cough  still  No date: Crohn disease ([place]) No date: GERD (gastroesophageal reflux disease) No date: Hand fracture, left No date: Headache(784.0) No date: Hypertension     Comment: no meds  No date: IBS (irritable bowel syndrome) No date: Shortness of breath     Comment: "sometimes laying down; sometimes w/activity"               (10/13/2012)   Reproductive/Obstetrics negative OB ROS                             Anesthesia Physical Anesthesia Plan  ASA: II  Anesthesia Plan: General   Post-op Pain  Management:    Induction:   Airway Management Planned:   Additional Equipment:   Intra-op Plan:   Post-operative Plan:   Informed Consent: I have reviewed the patients History and Physical, chart, labs and discussed the procedure including the risks, benefits and alternatives for the proposed anesthesia with the patient or authorized representative who has indicated his/her understanding and acceptance.   Dental Advisory Given  Plan Discussed with: Anesthesiologist, CRNA and Surgeon  Anesthesia Plan Comments:         Anesthesia Quick Evaluation

## 2015-11-13 NOTE — Op Note (Signed)
[facility] Gastroenterology Patient Name: Benjamin Murillo Procedure Date: 11/13/2015 3:07 PM MRN: [id number] Account #: [id number] Date of Birth: [birth year] Admit Type: Outpatient Age: 45 Room: [facility] ENDO ROOM 4 Gender: Male Note Status: Finalized Procedure:            Upper GI endoscopy Indications:          Epigastric abdominal pain Providers:            Manya Silvas, MD Medicines:            Propofol per Anesthesia Complications:        No immediate complications. Procedure:            Pre-Anesthesia Assessment:                       - After reviewing the risks and benefits, the patient                        was deemed in satisfactory condition to undergo the                        procedure.                       After obtaining informed consent, the endoscope was                        passed under direct vision. Throughout the procedure,                        the patient's blood pressure, pulse, and oxygen                        saturations were monitored continuously. The Endoscope                        was introduced through the mouth, and advanced to the                        second part of duodenum. The upper GI endoscopy was                        accomplished without difficulty. The patient tolerated                        the procedure well. Findings:      The examined esophagus was normal.GEJ42cm.      A small hiatal hernia was present.      Diffuse minimal inflammation characterized by granularity was found in       the gastric body. Biopsies were taken with a cold forceps for histology.      The gastric antrum was normal. Biopsies were taken with a cold forceps       for histology. Biopsies were taken with a cold forceps for Helicobacter       pylori testing.      The examined duodenum was normal. Impression:           - Normal esophagus.                       - Small hiatal hernia.                       -  Gastritis. Biopsied.                - Normal antrum. Biopsied.                       - Normal examined duodenum. Recommendation:       - Await pathology results. Manya Silvas, MD 11/13/2015 3:21:38 PM This report has been signed electronically. Number of Addenda: 0 Note Initiated On: 11/13/2015 3:07 PM      [facility]

## 2015-11-13 NOTE — Transfer of Care (Signed)
Immediate Anesthesia Transfer of Care Note  Patient: Benjamin Murillo  Procedure(s) Performed: Procedure(s): COLONOSCOPY WITH PROPOFOL (N/A) ESOPHAGOGASTRODUODENOSCOPY (EGD) WITH PROPOFOL (N/A)  Patient Location: PACU and Endoscopy Unit  Anesthesia Type:General  Level of Consciousness: patient cooperative and lethargic  Airway & Oxygen Therapy: Patient Spontanous Breathing and Patient connected to nasal cannula oxygen  Post-op Assessment: Report given to RN and Post -op Vital signs reviewed and stable  Post vital signs: Reviewed and stable  Last Vitals:  Vitals:   11/13/15 1403 11/13/15 1543  BP: 125/83 105/77  Pulse:  82  Resp: 16 16  Temp: 36.2 C 36.3 C    Last Pain:  Vitals:   11/13/15 1543  TempSrc: Tympanic  PainSc: Asleep         Complications: No apparent anesthesia complications

## 2015-11-14 NOTE — Anesthesia Postprocedure Evaluation (Deleted)
Anesthesia Post Note  Patient: Benjamin Murillo  Procedure(s) Performed: Procedure(s) (LRB): COLONOSCOPY WITH PROPOFOL (N/A) ESOPHAGOGASTRODUODENOSCOPY (EGD) WITH PROPOFOL (N/A)  Patient location during evaluation: Endoscopy Anesthesia Type: General Level of consciousness: awake and alert Pain management: pain level controlled Vital Signs Assessment: post-procedure vital signs reviewed and stable Respiratory status: spontaneous breathing, nonlabored ventilation and respiratory function stable Cardiovascular status: blood pressure returned to baseline and stable Postop Assessment: no signs of nausea or vomiting Anesthetic complications: no    Last Vitals:  Vitals:   11/13/15 1603 11/13/15 1613  BP: (!) 143/87 140/85  Pulse: 62 60  Resp: 13 13  Temp:      Last Pain:  Vitals:   11/13/15 1543  TempSrc: Tympanic  PainSc: Asleep                 Martha Clan

## 2015-11-15 LAB — SURGICAL PATHOLOGY

## 2015-11-18 ENCOUNTER — Encounter: Payer: Self-pay | Admitting: Unknown Physician Specialty

## 2015-11-20 NOTE — Anesthesia Postprocedure Evaluation (Signed)
Anesthesia Post Note  Patient: Benjamin Murillo  Procedure(s) Performed: Procedure(s) (LRB): COLONOSCOPY WITH PROPOFOL (N/A) ESOPHAGOGASTRODUODENOSCOPY (EGD) WITH PROPOFOL (N/A)  Patient location during evaluation: PACU Anesthesia Type: General Level of consciousness: awake and alert Pain management: pain level controlled Vital Signs Assessment: post-procedure vital signs reviewed and stable Respiratory status: spontaneous breathing, nonlabored ventilation, respiratory function stable and patient connected to nasal cannula oxygen Cardiovascular status: blood pressure returned to baseline and stable Postop Assessment: no signs of nausea or vomiting Anesthetic complications: no    Last Vitals:  Vitals:   11/13/15 1603 11/13/15 1613  BP: (!) 143/87 140/85  Pulse: 62 60  Resp: 13 13  Temp:      Last Pain:  Vitals:   11/13/15 1543  TempSrc: Tympanic  PainSc: Asleep                 [name]

## 2015-11-23 DIAGNOSIS — R109 Unspecified abdominal pain: Secondary | ICD-10-CM

## 2015-11-23 DIAGNOSIS — G8929 Other chronic pain: Secondary | ICD-10-CM | POA: Diagnosis present

## 2015-12-25 ENCOUNTER — Encounter: Payer: Self-pay | Admitting: Anesthesiology

## 2015-12-25 ENCOUNTER — Ambulatory Visit: Payer: Medicare Other | Attending: Anesthesiology | Admitting: Anesthesiology

## 2015-12-25 VITALS — BP 144/88 | HR 84 | Temp 98.0°F | Resp 18 | Ht 71.0 in | Wt 135.0 lb

## 2015-12-25 DIAGNOSIS — M47816 Spondylosis without myelopathy or radiculopathy, lumbar region: Secondary | ICD-10-CM

## 2015-12-25 DIAGNOSIS — M542 Cervicalgia: Secondary | ICD-10-CM | POA: Diagnosis not present

## 2015-12-25 DIAGNOSIS — M4686 Other specified inflammatory spondylopathies, lumbar region: Secondary | ICD-10-CM | POA: Insufficient documentation

## 2015-12-25 DIAGNOSIS — R1084 Generalized abdominal pain: Secondary | ICD-10-CM

## 2015-12-25 DIAGNOSIS — R109 Unspecified abdominal pain: Secondary | ICD-10-CM

## 2015-12-25 DIAGNOSIS — Z87891 Personal history of nicotine dependence: Secondary | ICD-10-CM | POA: Insufficient documentation

## 2015-12-25 DIAGNOSIS — K219 Gastro-esophageal reflux disease without esophagitis: Secondary | ICD-10-CM | POA: Diagnosis not present

## 2015-12-25 DIAGNOSIS — M5412 Radiculopathy, cervical region: Secondary | ICD-10-CM | POA: Insufficient documentation

## 2015-12-25 DIAGNOSIS — I10 Essential (primary) hypertension: Secondary | ICD-10-CM | POA: Diagnosis not present

## 2015-12-25 DIAGNOSIS — G8929 Other chronic pain: Secondary | ICD-10-CM | POA: Insufficient documentation

## 2015-12-25 DIAGNOSIS — M545 Low back pain: Secondary | ICD-10-CM | POA: Insufficient documentation

## 2015-12-25 DIAGNOSIS — K589 Irritable bowel syndrome without diarrhea: Secondary | ICD-10-CM | POA: Insufficient documentation

## 2015-12-25 DIAGNOSIS — M4696 Unspecified inflammatory spondylopathy, lumbar region: Secondary | ICD-10-CM

## 2015-12-25 MED ORDER — OXYCODONE-ACETAMINOPHEN 10-325 MG PO TABS: 1.0000 | ORAL_TABLET | Freq: Two times a day (BID) | ORAL | 0 refills | Status: DC

## 2015-12-25 NOTE — Progress Notes (Signed)
Safety precautions to be maintained throughout the outpatient stay will include: orient to surroundings, keep bed in low position, maintain call bell within reach at all times, provide assistance with transfer out of bed and ambulation.  

## 2015-12-27 NOTE — Progress Notes (Signed)
Subjective:  Patient ID: Benjamin Murillo, male    DOB: [birth year]  Age: 45 y.o. MRN: [id number]  CC: Abdominal Pain  Procedure: None  HPI Benjamin Murillo presents for a repeat evaluation And has been doing well with his regimen. Based on his narcotic assessment sheet he is tolerating his medications well and shows no evidence of any diverting or illicit use. Furthermore we've reviewed his database and it is acceptable.   He has a long-standing history of diffuse body pain and abdominal pain. He also has chronic low back pain. In addition to previous neck surgery with a 2 level fusion and chronic neck pain. He describes a pain in his neck and abdomen generally up around a VAS of 10 and a minimum of 8.He was recently admitted for an exacerbation of his Crohn's disease. He is currently being followed by Dr. Jaquita Rector and do for follow-up evaluation with him seen. Currently he mentions that his abdominal pain has improved and he still taking medications as prescribed denying any diverting or illicit use. Medications keep his pain under reasonable control based on his narcotic assessment sheet today. Furthermore his back pain and neck pain also have been stable with no change in symptom complex.   History Benjamin Murillo has a past medical history of Cervical radiculopathy; Chronic abdominal pain; Chronic neck pain; Cold; Crohn disease ([place]); GERD (gastroesophageal reflux disease); Hand fracture, left; Headache(784.0); Hypertension; IBS (irritable bowel syndrome); and Shortness of breath.   He has a past surgical history that includes Bowel resection; Nevus excision; Colon surgery; Anterior cervical decomp/discectomy fusion (N/A, 02/12/2013); Colonoscopy; Colonoscopy with propofol (N/A, 11/13/2015); and Esophagogastroduodenoscopy (egd) with propofol (N/A, 11/13/2015).   His family history includes Cancer in his maternal aunt; Diabetes in his paternal aunt; Hyperlipidemia in his mother.He reports that he quit  smoking about 6 months ago. His smoking use included Cigarettes. He has a 2.20 pack-year smoking history. He has never used smokeless tobacco. He reports that he does not drink alcohol or use drugs.   ---------------------------------------------------------------------------------------------------------------------- Past Medical History:  Diagnosis Date  . Cervical radiculopathy   . Chronic abdominal pain   . Chronic neck pain   . Cold    2 weeks ago with cough  still   . Crohn disease ([place])   . GERD (gastroesophageal reflux disease)   . Hand fracture, left   . Headache(784.0)   . Hypertension    no meds   . IBS (irritable bowel syndrome)   . Shortness of breath    "sometimes laying down; sometimes w/activity" (10/13/2012)    Past Surgical History:  Procedure Laterality Date  . ANTERIOR CERVICAL DECOMP/DISCECTOMY FUSION N/A 02/12/2013   Procedure: ANTERIOR CERVICAL DECOMPRESSION/DISCECTOMY FUSION 1 LEVEL;  Surgeon: Winfield Cunas, MD;  Location: [place];  Service: Neurosurgery;  Laterality: N/A;  C67 anterior cervical decompression with fusion plating and bonegraft  . BOWEL RESECTION     x2  . COLON SURGERY     drains placed to drain cyst  . COLONOSCOPY    . COLONOSCOPY WITH PROPOFOL N/A 11/13/2015   Procedure: COLONOSCOPY WITH PROPOFOL;  Surgeon: Manya Silvas, MD;  Location[facility] END[facility]COPY;  Service: Endoscopy;  Laterality: N/A;  . ESOPHAGOGASTRODUODENOSCOPY (EGD) WITH PROPOFOL N/A 11/13/2015   Procedure: ESOPHAGOGASTRODUODENOSCOPY (EGD) WITH PROPOFOL;  Surgeon: Manya Silvas, MD;  Location: [facility] ENDOSCOPY;  Service: Endoscopy;  Laterality: N/A;  . NEVUS EXCISION     left arm + hand    Family History  Problem Relation Age  of Onset  . Hyperlipidemia Mother   . Cancer Maternal Aunt   . Diabetes Paternal Aunt     Social History  Substance Use Topics  . Smoking status: Former Smoker    Packs/day: 0.10    Years: 22.00    Types: Cigarettes    Quit date:  06/05/2015  . Smokeless tobacco: Never Used     Comment: fMJ  06/05/15- trying to get out of smokes  . Alcohol use No    ---------------------------------------------------------------------------------------------------------------------- Social History   Social History  . Marital status: Legally Separated    Spouse name: N/A  . Number of children: N/A  . Years of education: N/A   Social History Main Topics  . Smoking status: Former Smoker    Packs/day: 0.10    Years: 22.00    Types: Cigarettes    Quit date: 06/05/2015  . Smokeless tobacco: Never Used     Comment: fMJ  06/05/15- trying to get out of smokes  . Alcohol use No  . Drug use: No  . Sexual activity: Not Currently     Comment: stopped smoking MJ 06/05/15   Other Topics Concern  . None   Social History Narrative  . None      ----------------------------------------------------------------------------------------------------------------------  ROS Review of Systems    Objective:  BP (!) 144/88   Pulse 84   Temp 98 F (36.7 C)   Resp 18   Ht 5\' 11"  (1.803 m)   Wt 135 lb (61.2 kg)   SpO2 96%   BMI 18.83 kg/m   Physical Exam  Patient is good historian cooperative and compliant Pupils are equally round reactive to light extraocular muscles are intact Heart is regular rate and rhythm without murmur  Assessment & Plan:   Benjamin Murillo was seen today for abdominal pain.  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 -     Discontinue: oxyCODONE-acetaminophen (PERCOCET) 10-325 MG tablet; Take 1 tablet by mouth 2 (two) times daily with a meal. -     oxyCODONE-acetaminophen (PERCOCET) 10-325 MG tablet; Take 1 tablet by mouth 2 (two) times daily with a meal.     ----------------------------------------------------------------------------------------------------------------------  Problem List Items Addressed This Visit      Other   AP (abdominal pain)   Relevant Orders   ToxASSURE Select 13 (MW), Urine    Other Visit Diagnoses    Recurring abdominal pain    -  Primary   Relevant Orders   ToxASSURE Select 13 (MW), Urine   ToxASSURE Select 13 (MW), Urine   Chronic bilateral low back pain without sciatica       Relevant Medications   oxyCODONE-acetaminophen (PERCOCET) 10-325 MG tablet   Other Relevant Orders   ToxASSURE Select 13 (MW), Urine   Chronic neck pain       Relevant Medications   oxyCODONE-acetaminophen (PERCOCET)  10-325 MG tablet   Other Relevant Orders   ToxASSURE Select 13 (MW), Urine   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 Generalized abdominal pain Is taking Percocet tablets 10 mg twice a day With  good relief and denies any diverting or illicit use with these medicines. We will refill his medications for the next month with return to clinic in 3 months He is to continue follow-up with his GI doctors.    2. Bilateral low back pain without sciatica He may be a candidate for a facet block   3. Chronic neck pain Continue follow-up with his with neck surgeon as indicated   4. Chronic neck and back pain We may start him on Percocet for chronic pain management is also  instructed to use NSAIDs such as Naprosyn 220mg   twice a day 5 days per week if okay with his GI doctors, prn -     ----------------------------------------------------------------------------------------------------------------------  I am having Mr. Crocitto maintain his HUMIRA PEN, predniSONE, ciprofloxacin, metroNIDAZOLE, nicotine, HYDROcodone-acetaminophen, and oxyCODONE-acetaminophen.   Meds ordered this encounter  Medications  . DISCONTD: oxyCODONE-acetaminophen (PERCOCET) 10-325 MG tablet    Sig: Take 1 tablet by mouth 2 (two) times daily with a meal.    Dispense:  60 tablet    Refill:  0  . DISCONTD: oxyCODONE-acetaminophen (PERCOCET) 10-325 MG tablet    Sig: Take 1 tablet by mouth 2 (two) times daily with a meal.    Dispense:  60 tablet    Refill:  0    Do not fill until [id number]  . oxyCODONE-acetaminophen (PERCOCET) 10-325 MG tablet    Sig: Take 1 tablet by mouth 2 (two) times daily with a meal.    Dispense:  60 tablet    Refill:  0    Do not fill until [id number]       Follow-up: Return in about 3 months (around 03/26/2016) for evaluation, med refill.    [name], MD

## 2015-12-30 LAB — TOXASSURE SELECT 13 (MW), URINE

## 2016-01-15 ENCOUNTER — Other Ambulatory Visit: Payer: Self-pay

## 2016-01-16 ENCOUNTER — Telehealth: Payer: Self-pay | Admitting: Surgery

## 2016-01-16 ENCOUNTER — Ambulatory Visit: Payer: Self-pay | Admitting: Surgery

## 2016-01-16 ENCOUNTER — Encounter: Payer: Self-pay | Admitting: Surgery

## 2016-01-16 ENCOUNTER — Ambulatory Visit (INDEPENDENT_AMBULATORY_CARE_PROVIDER_SITE_OTHER): Payer: Medicare Other | Admitting: Surgery

## 2016-01-16 VITALS — BP 124/90 | HR 70 | Temp 98.4°F | Ht 71.0 in | Wt 136.0 lb

## 2016-01-16 DIAGNOSIS — D171 Benign lipomatous neoplasm of skin and subcutaneous tissue of trunk: Secondary | ICD-10-CM | POA: Diagnosis not present

## 2016-01-16 NOTE — Patient Instructions (Signed)
We have your surgery scheduled for 01/24/16 at [facility] with [place]. Please see your blue pre-care sheet for surgery information. Please call our office if you have any questions or concerns.

## 2016-01-16 NOTE — Progress Notes (Signed)
01/16/2016  Reason for Visit:  Soft tissue mass of right back  History of Present Illness: Benjamin Murillo is a 45 y.o. male referred by Dr. Alene Mires for evaluation of soft tissue mass of the right back.  Patient reports that he's had this mass for approximately one year and started as a very small ball on the right side of his low back and over the course of the year and has been growing in size and has also been causing more discomfort. The patient reports that he has discomfort and some tenderness when lying on his back as the mass causes pressure. He has not noticed any redness or drainage coming from it. He does report that when he palpates it it is mobile.  He reports having intermittent subjective fevers and sweats at home although does not feel that this is related to the mass. Otherwise denies any chest pain, shortness of breath, nausea, vomiting.  Reports his chronic abdominal pain is at baseline with 3-4 bowel movements a day from his Crohn's disease.  Past Medical History: Past Medical History:  Diagnosis Date  . Cervical radiculopathy   . Chronic abdominal pain   . Chronic neck pain   . Cold    2 weeks ago with cough  still   . Crohn disease ([place])   . GERD (gastroesophageal reflux disease)   . Hand fracture, left   . Headache(784.0)   . Hypertension    no meds   . IBS (irritable bowel syndrome)   . Shortness of breath    "sometimes laying down; sometimes w/activity" (10/13/2012)     Past Surgical History: Past Surgical History:  Procedure Laterality Date  . ANTERIOR CERVICAL DECOMP/DISCECTOMY FUSION N/A 02/12/2013   Procedure: ANTERIOR CERVICAL DECOMPRESSION/DISCECTOMY FUSION 1 LEVEL;  Surgeon: Winfield Cunas, MD;  Location: [facility];  Service: Neurosurgery;  Laterality: N/A;  C67 anterior cervical decompression with fusion plating and bonegraft  . BOWEL RESECTION     x2  . COLON SURGERY     drains placed to drain cyst  . COLONOSCOPY    . COLONOSCOPY WITH  PROPOFOL N/A 11/13/2015   Procedure: COLONOSCOPY WITH PROPOFOL;  Surgeon: Manya Silvas, MD;  Location: [facility] ENDOSCOPY;  Service: Endoscopy;  Laterality: N/A;  . ESOPHAGOGASTRODUODENOSCOPY (EGD) WITH PROPOFOL N/A 11/13/2015   Procedure: ESOPHAGOGASTRODUODENOSCOPY (EGD) WITH PROPOFOL;  Surgeon: Manya Silvas, MD;  Location: [facility] ENDOSCOPY;  Service: Endoscopy;  Laterality: N/A;  . NEVUS EXCISION     left arm + hand    Home Medications: Prior to Admission medications   Medication Sig Start Date End Date Taking? Authorizing Provider  HUMIRA PEN 40 MG/0.8ML injection Inject 40 mg as directed every 14 (fourteen) days.  03/22/13   Historical Provider, MD  HYDROcodone-acetaminophen (NORCO/VICODIN) 5-325 MG tablet Take 1 tablet by mouth every 6 (six) hours as needed for moderate pain.    Historical Provider, MD  nicotine (NICODERM CQ - DOSED IN MG/24 HOURS) 14 mg/24hr patch Place 1 patch (14 mg total) onto the skin daily. 10/13/15   Loletha Grayer, MD  oxyCODONE-acetaminophen (PERCOCET) 10-325 MG tablet Take 1 tablet by mouth 2 (two) times daily with a meal. 12/25/15   [name], MD    Allergies: Allergies  Allergen Reactions  . Vicodin [Hydrocodone-Acetaminophen] Other (See Comments) and Anxiety    anxious  . Tramadol Other (See Comments)    Makes patient anxious and nervous.  . Morphine And Related Itching    High doses  Social History:  reports that he quit smoking about 7 months ago. His smoking use included Cigarettes. He has a 2.20 pack-year smoking history. He has never used smokeless tobacco. He reports that he does not drink alcohol or use drugs.   Family History: Family History  Problem Relation Age of Onset  . Hyperlipidemia Mother   . Cancer Maternal Aunt   . Diabetes Paternal Aunt     Review of Systems: Review of Systems  Constitutional: Positive for chills and fever.  HENT: Negative for hearing loss.   Eyes: Negative for blurred vision.  Respiratory:  Negative for cough and shortness of breath.   Cardiovascular: Negative for chest pain and leg swelling.  Gastrointestinal: Positive for abdominal pain. Negative for heartburn, nausea and vomiting.  Genitourinary: Negative for dysuria.  Musculoskeletal: Negative for myalgias.  Skin: Negative for rash.  Neurological: Negative for dizziness.  Psychiatric/Behavioral: Negative for depression.  All other systems reviewed and are negative.   Physical Exam BP 124/90   Pulse 70   Temp 98.4 F (36.9 C) (Oral)   Ht 5' 11"  (1.803 m)   Wt 61.7 kg (136 lb)   BMI 18.97 kg/m  CONSTITUTIONAL: No acute distress, afebrile HEENT:  Normocephalic, atraumatic, extraocular motion intact. NECK: Trachea is midline, and there is no jugular venous distension.  RESPIRATORY:  Lungs are clear, and breath sounds are equal bilaterally. Normal respiratory effort without pathologic use of accessory muscles. CARDIOVASCULAR: Heart is regular without murmurs, gallops, or rubs. GI: The abdomen is soft, nondistended, nontender to palpation.  MUSCULOSKELETAL:  Normal muscle strength and tone in all four extremities.  No peripheral edema or cyanosis. SKIN: Patient has a 5 cm soft tissue mass over the right lower back that is mobile and soft to palpation with mild discomfort with deep palpation. There is no erythema or drainage.  NEUROLOGIC:  Motor and sensation is grossly normal.  Cranial nerves are grossly intact. PSYCH:  Alert and oriented to person, place and time. Affect is normal.  Laboratory Analysis: No results found for this or any previous visit (from the past 24 hour(s)).  Imaging: No results found.  Assessment and Plan: This is a 45 y.o. male who presents with a right lower back soft tissue mass, most likely a lipoma.  -I discussed with the patient the possibility of excising this lipoma in the operating room. Patient reports that the mass has been growing in size over the last year and is causing him  discomfort and would like this to be excised. The risks and benefits of the procedure were discussed with the patient including risk of bleeding, infection, and injury to surrounding structures, and the patient has been informed consent. Also discussed with the patient at the mass after excision will be sent to pathology for further evaluation. The patient will be scheduled for surgery next week. The patient understands this plan and all of his questions have been answered.   Melvyn Neth, [place]

## 2016-01-16 NOTE — Telephone Encounter (Signed)
Pt advised of pre op date/time and sx date. Sx: 01/24/16 with Dr Orest Dikes lower back lipoma excision.  Pre op: 01/18/16 between 1-5:00pm--phone.   Patient made aware to call [phone], between 1-3:00pm the day before surgery, to find out what time to arrive.

## 2016-01-18 ENCOUNTER — Encounter
Admission: RE | Admit: 2016-01-18 | Discharge: 2016-01-18 | Disposition: A | Discharge status: Home / Self Care | Payer: Medicare Other | Source: Ambulatory Visit | Attending: Surgery | Admitting: Surgery

## 2016-01-18 NOTE — Patient Instructions (Signed)
  Your procedure is scheduled on: 01-24-16 Report to Same Day Surgery 2nd floor medical mall [facility] Entrance-take elevator on left to 2nd floor.  Check in with surgery information desk.) To find out your arrival time please call [phone] between 1PM - 3PM on 01-23-16  Remember: Instructions that are not followed completely may result in serious medical risk, up to and including death, or upon the discretion of your surgeon and anesthesiologist your surgery may need to be rescheduled.    _x___ 1. Do not eat food or drink liquids after midnight. No gum chewing or hard candies.     __x__ 2. No Alcohol for 24 hours before or after surgery.   __x__3. No Smoking for 24 prior to surgery.   ____  4. Bring all medications with you on the day of surgery if instructed.    __x__ 5. Notify your doctor if there is any change in your medical condition     (cold, fever, infections).     Do not wear jewelry, make-up, hairpins, clips or nail polish.  Do not wear lotions, powders, or perfumes. You may wear deodorant.  Do not shave 48 hours prior to surgery. Men may shave face and neck.  Do not bring valuables to the hospital.    [facility] is not responsible for any belongings or valuables.               Contacts, dentures or bridgework may not be worn into surgery.  Leave your suitcase in the car. After surgery it may be brought to your room.  For patients admitted to the hospital, discharge time is determined by your treatment team.   Patients discharged the day of surgery will not be allowed to drive home.  You will need someone to drive you home and stay with you the night of your procedure.    Please read over the following fact sheets that you were given:   [facility] Preparing for Surgery and or MRSA Information   ____ Take these medicines the morning of surgery with A SIP OF WATER:    1. NONE  2.  3.  4.  5.  6.  ____Fleets enema or Magnesium Citrate as directed.   ____  Use CHG Soap or sage wipes as directed on instruction sheet   ____ Use inhalers on the day of surgery and bring to hospital day of surgery  ____ Stop metformin 2 days prior to surgery    ____ Take 1/2 of usual insulin dose the night before surgery and none on the morning of           surgery.   ____ Stop Aspirin, Coumadin, Pllavix ,Eliquis, Effient, or Pradaxa  x__ Stop Anti-inflammatories such as Advil, Aleve, Ibuprofen, Motrin, Naproxen,          Naprosyn, Goodies powders or aspirin products. Ok to take Tylenol OR PERCOCET IF NEEDED   ____ Stop supplements until after surgery.    ____ Bring C-Pap to the hospital.

## 2016-01-23 MED ORDER — CEFAZOLIN SODIUM-DEXTROSE 2-4 GM/100ML-% IV SOLN
2.0000 g | INTRAVENOUS | Status: AC
  Administered 2016-01-24: 2 g via INTRAVENOUS

## 2016-01-24 ENCOUNTER — Encounter: Payer: Self-pay | Admitting: *Deleted

## 2016-01-24 ENCOUNTER — Ambulatory Visit: Payer: Medicare Other | Admitting: Anesthesiology

## 2016-01-24 ENCOUNTER — Encounter
Admission: RE | Disposition: A | Discharge status: Home / Self Care | Payer: Self-pay | Source: Ambulatory Visit | Attending: Surgery

## 2016-01-24 ENCOUNTER — Ambulatory Visit
Admission: RE | Admit: 2016-01-24 | Discharge: 2016-01-24 | Disposition: A | Discharge status: Home / Self Care | Payer: Medicare Other | Source: Ambulatory Visit | Attending: Surgery | Admitting: Surgery

## 2016-01-24 DIAGNOSIS — G8929 Other chronic pain: Secondary | ICD-10-CM | POA: Diagnosis not present

## 2016-01-24 DIAGNOSIS — I10 Essential (primary) hypertension: Secondary | ICD-10-CM | POA: Diagnosis not present

## 2016-01-24 DIAGNOSIS — D171 Benign lipomatous neoplasm of skin and subcutaneous tissue of trunk: Secondary | ICD-10-CM | POA: Diagnosis not present

## 2016-01-24 DIAGNOSIS — K589 Irritable bowel syndrome without diarrhea: Secondary | ICD-10-CM | POA: Insufficient documentation

## 2016-01-24 DIAGNOSIS — M5412 Radiculopathy, cervical region: Secondary | ICD-10-CM | POA: Insufficient documentation

## 2016-01-24 DIAGNOSIS — K5090 Crohn's disease, unspecified, without complications: Secondary | ICD-10-CM | POA: Diagnosis not present

## 2016-01-24 DIAGNOSIS — Z87891 Personal history of nicotine dependence: Secondary | ICD-10-CM | POA: Diagnosis not present

## 2016-01-24 DIAGNOSIS — K219 Gastro-esophageal reflux disease without esophagitis: Secondary | ICD-10-CM | POA: Insufficient documentation

## 2016-01-24 DIAGNOSIS — R222 Localized swelling, mass and lump, trunk: Secondary | ICD-10-CM | POA: Diagnosis present

## 2016-01-24 HISTORY — PX: LIPOMA EXCISION: SHX5283

## 2016-01-24 LAB — URINE DRUG SCREEN, QUALITATIVE (ARMC ONLY)
AMPHETAMINES, UR SCREEN: NOT DETECTED
BARBITURATES, UR SCREEN: NOT DETECTED
BENZODIAZEPINE, UR SCRN: NOT DETECTED
Cannabinoid 50 Ng, Ur ~~LOC~~: POSITIVE — AB
Cocaine Metabolite,Ur ~~LOC~~: NOT DETECTED
MDMA (Ecstasy)Ur Screen: NOT DETECTED
Methadone Scn, Ur: NOT DETECTED
OPIATE, UR SCREEN: NOT DETECTED
PHENCYCLIDINE (PCP) UR S: NOT DETECTED
Tricyclic, Ur Screen: NOT DETECTED

## 2016-01-24 SURGERY — EXCISION LIPOMA
Anesthesia: General | Laterality: Right

## 2016-01-24 MED ORDER — CEFAZOLIN SODIUM-DEXTROSE 2-4 GM/100ML-% IV SOLN
INTRAVENOUS | Status: AC
  Filled 2016-01-24: qty 100

## 2016-01-24 MED ORDER — ACETAMINOPHEN 500 MG PO TABS
ORAL_TABLET | ORAL | Status: AC
  Administered 2016-01-24: 1000 mg via ORAL
  Filled 2016-01-24: qty 2

## 2016-01-24 MED ORDER — MIDAZOLAM HCL 2 MG/2ML IJ SOLN
INTRAMUSCULAR | Status: AC
  Filled 2016-01-24: qty 2

## 2016-01-24 MED ORDER — FENTANYL CITRATE (PF) 100 MCG/2ML IJ SOLN
INTRAMUSCULAR | Status: AC
  Filled 2016-01-24: qty 2

## 2016-01-24 MED ORDER — PROPOFOL 500 MG/50ML IV EMUL
INTRAVENOUS | Status: DC | PRN
  Administered 2016-01-24: 50 ug/kg/min via INTRAVENOUS

## 2016-01-24 MED ORDER — PROPOFOL 10 MG/ML IV BOLUS
INTRAVENOUS | Status: AC
  Filled 2016-01-24: qty 20

## 2016-01-24 MED ORDER — BUPIVACAINE-EPINEPHRINE (PF) 0.25% -1:200000 IJ SOLN
INTRAMUSCULAR | Status: DC | PRN
  Administered 2016-01-24: 10 mL

## 2016-01-24 MED ORDER — ACETAMINOPHEN 500 MG PO TABS
1000.0000 mg | ORAL_TABLET | ORAL | Status: AC
  Administered 2016-01-24: 1000 mg via ORAL

## 2016-01-24 MED ORDER — OXYCODONE HCL 5 MG PO TABS
5.0000 mg | ORAL_TABLET | Freq: Once | ORAL | Status: AC
  Administered 2016-01-24: 5 mg via ORAL
  Filled 2016-01-24: qty 1

## 2016-01-24 MED ORDER — BUPIVACAINE LIPOSOME 1.3 % IJ SUSP
20.0000 mL | INTRAMUSCULAR | Status: DC
  Filled 2016-01-24: qty 20

## 2016-01-24 MED ORDER — BUPIVACAINE LIPOSOME 1.3 % IJ SUSP
INTRAMUSCULAR | Status: AC
  Filled 2016-01-24: qty 20

## 2016-01-24 MED ORDER — SODIUM CHLORIDE 0.9 % IV SOLN
INTRAVENOUS | Status: DC | PRN
  Administered 2016-01-24: 50 mL

## 2016-01-24 MED ORDER — GABAPENTIN 300 MG PO CAPS
300.0000 mg | ORAL_CAPSULE | ORAL | Status: AC
  Administered 2016-01-24: 300 mg via ORAL

## 2016-01-24 MED ORDER — OXYCODONE HCL 5 MG PO TABS
ORAL_TABLET | ORAL | Status: DC
Start: 2016-01-24 — End: 2016-01-24
  Filled 2016-01-24: qty 1

## 2016-01-24 MED ORDER — FENTANYL CITRATE (PF) 100 MCG/2ML IJ SOLN
INTRAMUSCULAR | Status: DC | PRN
  Administered 2016-01-24: 25 ug via INTRAVENOUS
  Administered 2016-01-24: 50 ug via INTRAVENOUS
  Administered 2016-01-24: 25 ug via INTRAVENOUS

## 2016-01-24 MED ORDER — SODIUM CHLORIDE 0.9 % IJ SOLN
INTRAMUSCULAR | Status: AC
  Filled 2016-01-24: qty 50

## 2016-01-24 MED ORDER — MIDAZOLAM HCL 2 MG/2ML IJ SOLN
INTRAMUSCULAR | Status: DC | PRN
  Administered 2016-01-24: 2 mg via INTRAVENOUS

## 2016-01-24 MED ORDER — GABAPENTIN 300 MG PO CAPS
ORAL_CAPSULE | ORAL | Status: AC
  Filled 2016-01-24: qty 1

## 2016-01-24 MED ORDER — LIDOCAINE HCL (CARDIAC) 20 MG/ML IV SOLN
INTRAVENOUS | Status: DC | PRN
  Administered 2016-01-24: 40 mg via INTRAVENOUS

## 2016-01-24 MED ORDER — CHLORHEXIDINE GLUCONATE CLOTH 2 % EX PADS: 6.0000 | MEDICATED_PAD | Freq: Once | CUTANEOUS | Status: DC

## 2016-01-24 MED ORDER — MEPERIDINE HCL 25 MG/ML IJ SOLN: 6.2500 mg | INTRAMUSCULAR | Status: DC | PRN

## 2016-01-24 MED ORDER — FAMOTIDINE 20 MG PO TABS
ORAL_TABLET | ORAL | Status: AC
  Filled 2016-01-24: qty 1

## 2016-01-24 MED ORDER — ACETAMINOPHEN 500 MG PO TABS
ORAL_TABLET | ORAL | Status: AC
  Filled 2016-01-24: qty 1

## 2016-01-24 MED ORDER — KETOROLAC TROMETHAMINE 30 MG/ML IJ SOLN
INTRAMUSCULAR | Status: AC
  Filled 2016-01-24: qty 1

## 2016-01-24 MED ORDER — FENTANYL CITRATE (PF) 100 MCG/2ML IJ SOLN
INTRAMUSCULAR | Status: AC
  Filled 2016-01-24: qty 4

## 2016-01-24 MED ORDER — FAMOTIDINE 20 MG PO TABS
ORAL_TABLET | ORAL | Status: AC
  Administered 2016-01-24: 20 mg via ORAL
  Filled 2016-01-24: qty 1

## 2016-01-24 MED ORDER — LIDOCAINE 2% (20 MG/ML) 5 ML SYRINGE
INTRAMUSCULAR | Status: AC
  Filled 2016-01-24: qty 5

## 2016-01-24 MED ORDER — LACTATED RINGERS IV SOLN
INTRAVENOUS | Status: DC
  Administered 2016-01-24: 13:00:00 via INTRAVENOUS

## 2016-01-24 MED ORDER — FENTANYL CITRATE (PF) 100 MCG/2ML IJ SOLN
25.0000 ug | INTRAMUSCULAR | Status: DC | PRN
  Administered 2016-01-24 (×3): 50 ug via INTRAVENOUS

## 2016-01-24 MED ORDER — PROMETHAZINE HCL 25 MG/ML IJ SOLN
6.2500 mg | INTRAMUSCULAR | Status: DC | PRN
  Administered 2016-01-24: 12.5 mg via INTRAVENOUS

## 2016-01-24 MED ORDER — OXYCODONE HCL 5 MG PO TABS: 5.0000 mg | ORAL_TABLET | ORAL | 0 refills | Status: DC | PRN

## 2016-01-24 MED ORDER — KETOROLAC TROMETHAMINE 30 MG/ML IJ SOLN
INTRAMUSCULAR | Status: DC | PRN
  Administered 2016-01-24: 30 mg via INTRAVENOUS

## 2016-01-24 MED ORDER — GABAPENTIN 300 MG PO CAPS
ORAL_CAPSULE | ORAL | Status: AC
  Administered 2016-01-24: 300 mg via ORAL
  Filled 2016-01-24: qty 1

## 2016-01-24 MED ORDER — PROMETHAZINE HCL 25 MG/ML IJ SOLN
INTRAMUSCULAR | Status: AC
  Administered 2016-01-24: 12.5 mg via INTRAVENOUS
  Filled 2016-01-24: qty 1

## 2016-01-24 MED ORDER — FAMOTIDINE 20 MG PO TABS
20.0000 mg | ORAL_TABLET | Freq: Once | ORAL | Status: AC
  Administered 2016-01-24: 20 mg via ORAL

## 2016-01-24 MED ORDER — BUPIVACAINE-EPINEPHRINE (PF) 0.25% -1:200000 IJ SOLN
INTRAMUSCULAR | Status: AC
  Filled 2016-01-24: qty 30

## 2016-01-24 SURGICAL SUPPLY — 32 items
BLADE SURG 15 STRL LF DISP TIS (BLADE) ×1 IMPLANT
BLADE SURG 15 STRL SS (BLADE) ×1
CHLORAPREP W/TINT 26ML (MISCELLANEOUS) ×2 IMPLANT
DERMABOND ADVANCED (GAUZE/BANDAGES/DRESSINGS) ×1
DERMABOND ADVANCED .7 DNX12 (GAUZE/BANDAGES/DRESSINGS) ×1 IMPLANT
DRAIN PENROSE 1/4X12 LTX (DRAIN) IMPLANT
DRAPE LAPAROTOMY 100X77 ABD (DRAPES) ×2 IMPLANT
DRAPE SHEET LG 3/4 BI-LAMINATE (DRAPES) ×2 IMPLANT
ELECT CAUTERY BLADE 6.4 (BLADE) ×2 IMPLANT
ELECT REM PT RETURN 9FT ADLT (ELECTROSURGICAL) ×2
ELECTRODE REM PT RTRN 9FT ADLT (ELECTROSURGICAL) ×1 IMPLANT
GLOVE BIO SURGEON STRL SZ8 (GLOVE) ×8 IMPLANT
GOWN STRL REUS W/ TWL LRG LVL3 (GOWN DISPOSABLE) ×2 IMPLANT
GOWN STRL REUS W/TWL LRG LVL3 (GOWN DISPOSABLE) ×2
LABEL OR SOLS (LABEL) ×2 IMPLANT
MARGIN MAP 10MM (MISCELLANEOUS) IMPLANT
NEEDLE HYPO 22GX1.5 SAFETY (NEEDLE) ×2 IMPLANT
NS IRRIG 500ML POUR BTL (IV SOLUTION) ×2 IMPLANT
PACK BASIN MINOR ARMC (MISCELLANEOUS) ×2 IMPLANT
PAD ABD DERMACEA PRESS 5X9 (GAUZE/BANDAGES/DRESSINGS) IMPLANT
SPONGE LAP 18X18 5 PK (GAUZE/BANDAGES/DRESSINGS) IMPLANT
SUT ETHILON 2 0 FS 18 (SUTURE) IMPLANT
SUT ETHILON 3-0 FS-10 30 BLK (SUTURE)
SUT MNCRL 4-0 (SUTURE) ×1
SUT MNCRL 4-018XMFL (SUTURE) ×1
SUT VIC AB 2-0 CT2 27 (SUTURE) IMPLANT
SUT VIC AB 3-0 SH 27 (SUTURE) ×1
SUT VIC AB 3-0 SH 27X BRD (SUTURE) ×1 IMPLANT
SUTURE EHLN 3-0 FS-10 30 BLK (SUTURE) IMPLANT
SUTURE MNCRL 4-018XMF (SUTURE) ×1 IMPLANT
SYR BULB EAR ULCER 3OZ GRN STR (SYRINGE) ×2 IMPLANT
SYRINGE 10CC LL (SYRINGE) ×2 IMPLANT

## 2016-01-24 NOTE — Transfer of Care (Signed)
Immediate Anesthesia Transfer of Care Note  Patient: Benjamin Murillo  Procedure(s) Performed: Procedure(s) with comments: EXCISION LIPOMA (Right) - right back lipoma excision, patient should be prone  Patient Location: PACU  Anesthesia Type:MAC  Level of Consciousness: sedated  Airway & Oxygen Therapy: Patient Spontanous Breathing and Patient connected to nasal cannula oxygen  Post-op Assessment: Report given to RN and Post -op Vital signs reviewed and stable  Post vital signs: Reviewed and stable  Last Vitals:  Vitals:   01/24/16 1204 01/24/16 1433  BP: 125/87 104/75  Pulse: 97 73  Resp: 18 12  Temp: 37.2 C     Last Pain:  Vitals:   01/24/16 1204  TempSrc: Oral  PainSc: 10-Worst pain ever         Complications: No apparent anesthesia complications

## 2016-01-24 NOTE — Anesthesia Postprocedure Evaluation (Signed)
Anesthesia Post Note  Patient: Benjamin Murillo  Procedure(s) Performed: Procedure(s) (LRB): EXCISION LIPOMA (Right)  Patient location during evaluation: PACU Anesthesia Type: General Level of consciousness: awake and alert and oriented Pain management: pain level controlled Vital Signs Assessment: post-procedure vital signs reviewed and stable Respiratory status: spontaneous breathing, nonlabored ventilation and respiratory function stable Cardiovascular status: blood pressure returned to baseline and stable Postop Assessment: no signs of nausea or vomiting Anesthetic complications: no     Last Vitals:  Vitals:   01/24/16 1503 01/24/16 1513  BP: 107/78   Pulse: (!) 57 62  Resp: 13 10  Temp:      Last Pain:  Vitals:   01/24/16 1513  TempSrc:   PainSc: 8                  Sarahy Creedon

## 2016-01-24 NOTE — Anesthesia Preprocedure Evaluation (Signed)
Anesthesia Evaluation  Patient identified by MRN, date of birth, ID band Patient awake    Reviewed: Allergy & Precautions, NPO status , Patient's Chart, lab work & pertinent test results  History of Anesthesia Complications Negative for: history of anesthetic complications  Airway Mallampati: II  TM Distance: >3 FB Neck ROM: Full    Dental no notable dental hx.    Pulmonary neg sleep apnea, neg COPD, Current Smoker,    breath sounds clear to auscultation- rhonchi (-) wheezing      Cardiovascular Exercise Tolerance: Good hypertension, (-) CAD and (-) Past MI  Rhythm:Regular Rate:Normal - Systolic murmurs and - Diastolic murmurs    Neuro/Psych  Headaches, negative psych ROS   GI/Hepatic Neg liver ROS, PUD, GERD  ,crohns disease    Endo/Other  negative endocrine ROSneg diabetes  Renal/GU negative Renal ROS     Musculoskeletal negative musculoskeletal ROS (+)   Abdominal (+) - obese,   Peds  Hematology negative hematology ROS (+)   Anesthesia Other Findings Past Medical History: No date: Cervical radiculopathy No date: Chronic abdominal pain No date: Chronic neck pain No date: Cold No date: Crohn disease ([place]) No date: GERD (gastroesophageal reflux disease)     Comment: no meds-2-3 times weekly No date: Hand fracture, left No date: Headache(784.0) No date: Hypertension     Comment: no meds  No date: IBS (irritable bowel syndrome) No date: Shortness of breath     Comment: "sometimes laying down; sometimes w/activity"               (10/13/2012)   Reproductive/Obstetrics                             Anesthesia Physical Anesthesia Plan  ASA: II  Anesthesia Plan: General   Post-op Pain Management:    Induction: Intravenous  Airway Management Planned: Natural Airway  Additional Equipment:   Intra-op Plan:   Post-operative Plan:   Informed Consent: I have reviewed the  patients History and Physical, chart, labs and discussed the procedure including the risks, benefits and alternatives for the proposed anesthesia with the patient or authorized representative who has indicated his/her understanding and acceptance.   Dental advisory given  Plan Discussed with: CRNA and Anesthesiologist  Anesthesia Plan Comments:         Anesthesia Quick Evaluation

## 2016-01-24 NOTE — Discharge Instructions (Signed)
AMBULATORY SURGERY  °DISCHARGE INSTRUCTIONS ° ° °1) The drugs that you were given will stay in your system until tomorrow so for the next 24 hours you should not: ° °A) Drive an automobile °B) Make any legal decisions °C) Drink any alcoholic beverage ° ° °2) You may resume regular meals tomorrow.  Today it is better to start with liquids and gradually work up to solid foods. ° °You may eat anything you prefer, but it is better to start with liquids, then soup and crackers, and gradually work up to solid foods. ° ° °3) Please notify your doctor immediately if you have any unusual bleeding, trouble breathing, redness and pain at the surgery site, drainage, fever, or pain not relieved by medication. ° ° ° °4) Additional Instructions: ° ° ° ° ° ° ° °Please contact your physician with any problems or Same Day Surgery at [phone], Monday through Friday 6 am to 4 pm, or [place] [facility] [place] Main number at [phone]. °

## 2016-01-24 NOTE — H&P (View-Only) (Signed)
01/16/2016  Reason for Visit:  Soft tissue mass of right back  History of Present Illness: Benjamin Murillo is a 45 y.o. male referred by Dr. Alene Mires for evaluation of soft tissue mass of the right back.  Patient reports that he's had this mass for approximately one year and started as a very small ball on the right side of his low back and over the course of the year and has been growing in size and has also been causing more discomfort. The patient reports that he has discomfort and some tenderness when lying on his back as the mass causes pressure. He has not noticed any redness or drainage coming from it. He does report that when he palpates it it is mobile.  He reports having intermittent subjective fevers and sweats at home although does not feel that this is related to the mass. Otherwise denies any chest pain, shortness of breath, nausea, vomiting.  Reports his chronic abdominal pain is at baseline with 3-4 bowel movements a day from his Crohn's disease.  Past Medical History: Past Medical History:  Diagnosis Date  . Cervical radiculopathy   . Chronic abdominal pain   . Chronic neck pain   . Cold    2 weeks ago with cough  still   . Crohn disease ([place])   . GERD (gastroesophageal reflux disease)   . Hand fracture, left   . Headache(784.0)   . Hypertension    no meds   . IBS (irritable bowel syndrome)   . Shortness of breath    "sometimes laying down; sometimes w/activity" (10/13/2012)     Past Surgical History: Past Surgical History:  Procedure Laterality Date  . ANTERIOR CERVICAL DECOMP/DISCECTOMY FUSION N/A 02/12/2013   Procedure: ANTERIOR CERVICAL DECOMPRESSION/DISCECTOMY FUSION 1 LEVEL;  Surgeon: Winfield Cunas, MD;  Location: [facility];  Service: Neurosurgery;  Laterality: N/A;  C67 anterior cervical decompression with fusion plating and bonegraft  . BOWEL RESECTION     x2  . COLON SURGERY     drains placed to drain cyst  . COLONOSCOPY    . COLONOSCOPY WITH  PROPOFOL N/A 11/13/2015   Procedure: COLONOSCOPY WITH PROPOFOL;  Surgeon: Manya Silvas, MD;  Location[facility]COPY;  Service: Endoscopy;  Laterality: N/A;  . ESOPHAGOGASTRODUODENOSCOPY (EGD) WITH PROPOFOL N/A 11/13/2015   Procedure: ESOPHAGOGASTRODUODENOSCOPY (EGD) WITH PROPOFOL;  Surgeon: Manya Silvas, MD;  Location: [facility] ENDOSCOPY;  Service: Endoscopy;  Laterality: N/A;  . NEVUS EXCISION     left arm + hand    Home Medications: Prior to Admission medications   Medication Sig Start Date End Date Taking? Authorizing Provider  HUMIRA PEN 40 MG/0.8ML injection Inject 40 mg as directed every 14 (fourteen) days.  03/22/13   Historical Provider, MD  HYDROcodone-acetaminophen (NORCO/VICODIN) 5-325 MG tablet Take 1 tablet by mouth every 6 (six) hours as needed for moderate pain.    Historical Provider, MD  nicotine (NICODERM CQ - DOSED IN MG/24 HOURS) 14 mg/24hr patch Place 1 patch (14 mg total) onto the skin daily. 10/13/15   Loletha Grayer, MD  oxyCODONE-acetaminophen (PERCOCET) 10-325 MG tablet Take 1 tablet by mouth 2 (two) times daily with a meal. 12/25/15   [name], MD    Allergies: Allergies  Allergen Reactions  . Vicodin [Hydrocodone-Acetaminophen] Other (See Comments) and Anxiety    anxious  . Tramadol Other (See Comments)    Makes patient anxious and nervous.  . Morphine And Related Itching    High doses  Social History:  reports that he quit smoking about 7 months ago. His smoking use included Cigarettes. He has a 2.20 pack-year smoking history. He has never used smokeless tobacco. He reports that he does not drink alcohol or use drugs.   Family History: Family History  Problem Relation Age of Onset  . Hyperlipidemia Mother   . Cancer Maternal Aunt   . Diabetes Paternal Aunt     Review of Systems: Review of Systems  Constitutional: Positive for chills and fever.  HENT: Negative for hearing loss.   Eyes: Negative for blurred vision.  Respiratory:  Negative for cough and shortness of breath.   Cardiovascular: Negative for chest pain and leg swelling.  Gastrointestinal: Positive for abdominal pain. Negative for heartburn, nausea and vomiting.  Genitourinary: Negative for dysuria.  Musculoskeletal: Negative for myalgias.  Skin: Negative for rash.  Neurological: Negative for dizziness.  Psychiatric/Behavioral: Negative for depression.  All other systems reviewed and are negative.   Physical Exam BP 124/90   Pulse 70   Temp 98.4 F (36.9 C) (Oral)   Ht 5' 11"  (1.803 m)   Wt 61.7 kg (136 lb)   BMI 18.97 kg/m  CONSTITUTIONAL: No acute distress, afebrile HEENT:  Normocephalic, atraumatic, extraocular motion intact. NECK: Trachea is midline, and there is no jugular venous distension.  RESPIRATORY:  Lungs are clear, and breath sounds are equal bilaterally. Normal respiratory effort without pathologic use of accessory muscles. CARDIOVASCULAR: Heart is regular without murmurs, gallops, or rubs. GI: The abdomen is soft, nondistended, nontender to palpation.  MUSCULOSKELETAL:  Normal muscle strength and tone in all four extremities.  No peripheral edema or cyanosis. SKIN: Patient has a 5 cm soft tissue mass over the right lower back that is mobile and soft to palpation with mild discomfort with deep palpation. There is no erythema or drainage.  NEUROLOGIC:  Motor and sensation is grossly normal.  Cranial nerves are grossly intact. PSYCH:  Alert and oriented to person, place and time. Affect is normal.  Laboratory Analysis: No results found for this or any previous visit (from the past 24 hour(s)).  Imaging: No results found.  Assessment and Plan: This is a 45 y.o. male who presents with a right lower back soft tissue mass, most likely a lipoma.  -I discussed with the patient the possibility of excising this lipoma in the operating room. Patient reports that the mass has been growing in size over the last year and is causing him  discomfort and would like this to be excised. The risks and benefits of the procedure were discussed with the patient including risk of bleeding, infection, and injury to surrounding structures, and the patient has been informed consent. Also discussed with the patient at the mass after excision will be sent to pathology for further evaluation. The patient will be scheduled for surgery next week. The patient understands this plan and all of his questions have been answered.   Melvyn Neth, [place]

## 2016-01-24 NOTE — Anesthesia Procedure Notes (Signed)
Performed by: Dineen Conradt Pre-anesthesia Checklist: Patient identified, Emergency Drugs available, Suction available, Patient being monitored and Timeout performed Patient Re-evaluated:Patient Re-evaluated prior to inductionOxygen Delivery Method: Nasal cannula       

## 2016-01-24 NOTE — Op Note (Signed)
  Procedure Date:  01/24/2016  Pre-operative Diagnosis:  Right back mass  Post-operative Diagnosis:  Right back mass  Procedure:  Excision of right back mass  Surgeon:  Melvyn Neth, MD  Anesthesia:  10 ml of 0.25% Marcaine w/ Epi, 50 ml of Exparel  Estimated Blood Loss:  2 ml  Specimens:  Right back mass  Complications:  None  Indications for Procedure:  This is a 45 y.o. male who presented to the office with a mass of the right back, likely a lipoma.  This was causing discomfort when lying down on his back and wished for it to be removed.  The risks of bleeding, abscess or infection, injury to surrounding structures, and need for further procedures were all discussed with the patient and he was willing to proceed.  Description of Procedure: The patient was correctly identified in the preoperative area and brought into the operating room.  The patient was placed supine with VTE prophylaxis in place.  Appropriate time-outs were performed.  The patient was placed in prone position.  Anesthesia was induced.  Appropriate antibiotics were infused.  The right back was prepped and draped in usual sterile fashion.  A 5.5 cm incision along Langer's line over the mass was made.  Using electrocautery, the subcutaneous tissue was dissected, revealing the lipoma.  Cautery was used to dissect the lipoma off the subcutaneous tissue and the muscular fascia, leaving the fascia intact.  There was no invasion of the mass.  The mass was removed intact, measuring 5 cm x 5 cm x 2 cm and sent to pathology.  The base of the wound was irrigated and hemostasis was assured.  50 ml of Exparel solution were then infiltrated on the wound bed and subcutaneous tissue.  The incision was then closed in two layers with 3-0 Vicryl and 4-0 Monocryl.  The wound was cleaned and dried and sealed with DermaBond.  The patient was emerged from anesthesia and placed in supine position and brought to the recovery room for further  management.  The patient tolerated the procedure well and all counts were correct at the end of the case.   Melvyn Neth, MD

## 2016-01-24 NOTE — Interval H&P Note (Signed)
History and Physical Interval Note:  01/24/2016 12:37 PM  [name]  has presented today for surgery, with the diagnosis of right back lipoma  The various methods of treatment have been discussed with the patient and family. After consideration of risks, benefits and other options for treatment, the patient has consented to  Procedure(s) with comments: EXCISION LIPOMA (Right) - right back lipoma excision, patient should be prone as a surgical intervention .  The patient's history has been reviewed, patient examined, no change in status, stable for surgery.  I have reviewed the patient's chart and labs.  Questions were answered to the patient's satisfaction.     Ailah Barna

## 2016-01-25 ENCOUNTER — Encounter: Payer: Self-pay | Admitting: Surgery

## 2016-01-26 LAB — SURGICAL PATHOLOGY

## 2016-02-07 ENCOUNTER — Encounter: Payer: Self-pay | Admitting: Surgery

## 2016-02-07 ENCOUNTER — Ambulatory Visit (INDEPENDENT_AMBULATORY_CARE_PROVIDER_SITE_OTHER): Payer: Medicare Other | Admitting: Surgery

## 2016-02-07 DIAGNOSIS — Z9889 Other specified postprocedural states: Secondary | ICD-10-CM

## 2016-02-07 DIAGNOSIS — Z86018 Personal history of other benign neoplasm: Secondary | ICD-10-CM

## 2016-02-07 NOTE — Progress Notes (Signed)
02/07/2016  HPI: Patient status post lipoma excision of the right lower back on 12/27. He presents today for postop follow-up. Patient denies having any pain or issues with the incision has been healing well. He is able to sleep on his back without any further pain.  Vital signs: BP 112/73   Pulse 64   Temp 97.9 F (36.6 C) (Oral)   Ht 5' 11"  (1.803 m)   Wt 60.7 kg (133 lb 12.8 oz)   BMI 18.66 kg/m    Physical Exam: Constitutional: No acute distress Skin: Right lower back incision has healed well with no evidence of infection. Dermabond is still in place although it starting to peel off. There is a minimal amount of swelling consistent with a possible seroma but there is no evidence of infection.  Assessment/Plan: 46 year old male status post lipoma excision of the right lower back.  -Have reassured the patient that the wound continues to heal well with no issues. Have informed the patient that he can now bathe and submerge the wound in pool or tub. He may continue activities as tolerated. -Patient may follow-up with [place] on an as-needed basis.   Melvyn Neth, [place]

## 2016-02-07 NOTE — Patient Instructions (Signed)
Please call our office if you have questions or concerns.   

## 2016-03-25 ENCOUNTER — Telehealth: Payer: Self-pay | Admitting: Anesthesiology

## 2016-03-25 NOTE — Telephone Encounter (Signed)
Patient is out of meds, states he called on Friday and was told to call back on Monday. He states he was told to call the week before he run out of meds and that was on Friday.

## 2016-03-25 NOTE — Telephone Encounter (Signed)
Talked with Benjamin Murillo about needing appt for medication management. Patient given appt for 03/26/16 @ 1415.  Educated patient that from now on it is up to him to make sure that he has a medication management appt to get medicines before Benjamin date.  Patient verbalizes u/o information.

## 2016-03-26 ENCOUNTER — Encounter: Payer: Self-pay | Admitting: Anesthesiology

## 2016-03-26 ENCOUNTER — Ambulatory Visit: Payer: Medicare Other | Attending: Anesthesiology | Admitting: Anesthesiology

## 2016-03-26 VITALS — BP 134/89 | HR 80 | Temp 98.4°F | Resp 18 | Ht 71.0 in | Wt 140.0 lb

## 2016-03-26 DIAGNOSIS — R1084 Generalized abdominal pain: Secondary | ICD-10-CM | POA: Insufficient documentation

## 2016-03-26 DIAGNOSIS — R109 Unspecified abdominal pain: Secondary | ICD-10-CM

## 2016-03-26 DIAGNOSIS — M545 Low back pain: Secondary | ICD-10-CM | POA: Diagnosis not present

## 2016-03-26 DIAGNOSIS — R51 Headache: Secondary | ICD-10-CM | POA: Insufficient documentation

## 2016-03-26 DIAGNOSIS — M542 Cervicalgia: Secondary | ICD-10-CM | POA: Diagnosis not present

## 2016-03-26 DIAGNOSIS — G8929 Other chronic pain: Secondary | ICD-10-CM | POA: Diagnosis not present

## 2016-03-26 DIAGNOSIS — K5090 Crohn's disease, unspecified, without complications: Secondary | ICD-10-CM | POA: Insufficient documentation

## 2016-03-26 DIAGNOSIS — I10 Essential (primary) hypertension: Secondary | ICD-10-CM | POA: Diagnosis not present

## 2016-03-26 DIAGNOSIS — M47816 Spondylosis without myelopathy or radiculopathy, lumbar region: Secondary | ICD-10-CM

## 2016-03-26 DIAGNOSIS — K219 Gastro-esophageal reflux disease without esophagitis: Secondary | ICD-10-CM | POA: Insufficient documentation

## 2016-03-26 DIAGNOSIS — M5412 Radiculopathy, cervical region: Secondary | ICD-10-CM | POA: Insufficient documentation

## 2016-03-26 DIAGNOSIS — M4696 Unspecified inflammatory spondylopathy, lumbar region: Secondary | ICD-10-CM | POA: Diagnosis not present

## 2016-03-26 DIAGNOSIS — F17210 Nicotine dependence, cigarettes, uncomplicated: Secondary | ICD-10-CM | POA: Diagnosis not present

## 2016-03-26 MED ORDER — OXYCODONE-ACETAMINOPHEN 10-325 MG PO TABS: 1.0000 | ORAL_TABLET | Freq: Two times a day (BID) | ORAL | 0 refills | Status: DC | PRN

## 2016-03-26 NOTE — Progress Notes (Signed)
Safety precautions to be maintained throughout the outpatient stay will include: orient to surroundings, keep bed in low position, maintain call bell within reach at all times, provide assistance with transfer out of bed and ambulation.  

## 2016-03-27 NOTE — Progress Notes (Signed)
Subjective:  Patient ID: Benjamin Murillo, male    DOB: 03/10/1970  Age: 46 y.o. MRN: [id number]  CC: Abdominal Pain  Procedure: None  HPI Benjamin Murillo presents for reevaluation last seen a few months ago. The quality characteristic and distribution of his neck pain and low back pain and abdominal pain are unchanged. He still taking his medication as prescribed with no evidence of diverting or illicit use and he maintains that his overall lifestyle and function continued to be better with the medications. I have reviewed his narcotic assessment sheet and it is appropriate. He is due to see his neck surgeon in the next month but has no changes in upper extremity strength or function excellent reports that the frequency of his bowel movements has improved with the medications.   He has a long-standing history of diffuse body pain and abdominal pain. He also has chronic low back pain. In addition to previous neck surgery with a 2 level fusion and chronic neck pain. He describes a pain in his neck and abdomen generally up around a VAS of 10 and a minimum of 8.He was recently admitted for an exacerbation of his Crohn's disease. He is currently being followed by Dr. Jaquita Rector and do for follow-up evaluation with him seen. Currently he mentions that his abdominal pain has improved and he still taking medications as prescribed denying any diverting or illicit use. Medications keep his pain under reasonable control based on his narcotic assessment sheet today. Furthermore his back pain and neck pain also have been stable with no change in symptom complex.   History Benjamin Murillo has a past medical history of Cervical radiculopathy; Chronic abdominal pain; Chronic neck pain; Cold; Crohn disease ([place]); GERD (gastroesophageal reflux disease); Hand fracture, left; Headache(784.0); Hypertension; IBS (irritable bowel syndrome); and Shortness of breath.   He has a past surgical history that includes Bowel resection;  Nevus excision; Anterior cervical decomp/discectomy fusion (N/A, 02/12/2013); Colonoscopy; Colonoscopy with propofol (N/A, 11/13/2015); Esophagogastroduodenoscopy (egd) with propofol (N/A, 11/13/2015); Colon surgery; and Lipoma excision (Right, 01/24/2016).   His family history includes Cancer in his maternal aunt; Diabetes in his paternal aunt; Hyperlipidemia in his mother.He reports that he has been smoking Cigarettes.  He has a 2.20 pack-year smoking history. He has never used smokeless tobacco. He reports that he does not drink alcohol or use drugs.   ---------------------------------------------------------------------------------------------------------------------- Past Medical History:  Diagnosis Date  . Cervical radiculopathy   . Chronic abdominal pain   . Chronic neck pain   . Cold   . Crohn disease ([place])   . GERD (gastroesophageal reflux disease)    no meds-2-3 times weekly  . Hand fracture, left   . Headache(784.0)   . Hypertension    no meds   . IBS (irritable bowel syndrome)   . Shortness of breath    "sometimes laying down; sometimes w/activity" (10/13/2012)    Past Surgical History:  Procedure Laterality Date  . ANTERIOR CERVICAL DECOMP/DISCECTOMY FUSION N/A 02/12/2013   Procedure: ANTERIOR CERVICAL DECOMPRESSION/DISCECTOMY FUSION 1 LEVEL;  Surgeon: Winfield Cunas, MD;  Location: [facility];  Service: Neurosurgery;  Laterality: N/A;  C67 anterior cervical decompression with fusion plating and bonegraft  . BOWEL RESECTION     x2  . COLON SURGERY     drains placed to drain cyst  . COLONOSCOPY    . COLONOSCOPY WITH PROPOFOL N/A 11/13/2015   Procedure: COLONOSCOPY WITH PROPOFOL;  Surgeon: Manya Silvas, MD;  Location: [facility] ENDOSCOPY;  Service: Endoscopy;  Laterality: N/A;  . ESOPHAGOGASTRODUODENOSCOPY (EGD) WITH PROPOFOL N/A 11/13/2015   Procedure: ESOPHAGOGASTRODUODENOSCOPY (EGD) WITH PROPOFOL;  Surgeon: Manya Silvas, MD;  Location: [facility] ENDOSCOPY;  Service:  Endoscopy;  Laterality: N/A;  . LIPOMA EXCISION Right 01/24/2016   Procedure: EXCISION LIPOMA;  Surgeon: Olean Ree, MD;  Location: [facility] ORS;  Service: General;  Laterality: Right;  right back lipoma excision, patient should be prone  . NEVUS EXCISION     left arm + hand    Family History  Problem Relation Age of Onset  . Hyperlipidemia Mother   . Cancer Maternal Aunt   . Diabetes Paternal Aunt     Social History  Substance Use Topics  . Smoking status: Current Some Day Smoker    Packs/day: 0.10    Years: 22.00    Types: Cigarettes  . Smokeless tobacco: Never Used  . Alcohol use No    ---------------------------------------------------------------------------------------------------------------------- Social History   Social History  . Marital status: Legally Separated    Spouse name: N/A  . Number of children: N/A  . Years of education: N/A   Social History Main Topics  . Smoking status: Current Some Day Smoker    Packs/day: 0.10    Years: 22.00    Types: Cigarettes  . Smokeless tobacco: Never Used  . Alcohol use No  . Drug use: No  . Sexual activity: Not Currently     Comment: stopped smoking MJ 06/05/15   Other Topics Concern  . None   Social History Narrative  . None      ----------------------------------------------------------------------------------------------------------------------  ROS Review of Systems GI: Diminished diarrhea no constipation    Objective:  BP 134/89   Pulse 80   Temp 98.4 F (36.9 C) (Oral)   Resp 18   Ht 5' 11"  (1.803 m)   Wt 140 lb (63.5 kg)   SpO2 100%   BMI 19.53 kg/m   Physical Exam  Patient is good historian cooperative and compliant Pupils are equally round reactive to light extraocular muscles are intact Heart is regular rate and rhythm without murmur Upper extremity strength remains good with good range of motion muscle tone and bulk  Assessment & Plan:   There are no diagnoses linked to this  encounter.   ----------------------------------------------------------------------------------------------------------------------  Problem List Items Addressed This Visit    None      ----------------------------------------------------------------------------------------------------------------------  1. Generalized abdominal pain IAs before he is s taking Percocet tablets 10 mg twice a day With  good relief and denies any diverting or illicit use with these medicines. We will refill his medications for the next month with return to clinic in 3 months He is to continue follow-up with his GI doctors.    2. Bilateral low back pain without sciatica He may be a candidate for a facet block   3. Chronic neck pain Continue follow-up with his with neck surgeon as indicated   4. Chronic neck and back pain Continue stretching strengthening exercises and continue opioid management for his chronic diffuse pain. We have reviewed the [place] and it remains appropriate. He has been compliant with our regimen. We talked about the benefits and risks of opioid management once again.-     ----------------------------------------------------------------------------------------------------------------------  I am having Mr. Ginley maintain his HUMIRA PEN, nicotine, oxyCODONE, and oxyCODONE-acetaminophen.   Meds ordered this encounter  Medications  . DISCONTD: oxyCODONE-acetaminophen (PERCOCET) 10-325 MG tablet    Sig: Take 1 tablet by mouth every 12 (twelve) hours as needed for pain.  Dispense:  60 tablet    Refill:  0  . DISCONTD: oxyCODONE-acetaminophen (PERCOCET) 10-325 MG tablet    Sig: Take 1 tablet by mouth every 12 (twelve) hours as needed for pain.    Dispense:  60 tablet    Refill:  0    Do not fill until [id number]  . oxyCODONE-acetaminophen (PERCOCET) 10-325 MG tablet    Sig: Take 1 tablet by mouth every 12 (twelve) hours as needed for pain.     Dispense:  60 tablet    Refill:  0    Do not fill until [id number]       Follow-up: Return in about 3 months (around 06/23/2016) for evaluation, med refill.    [name], MD

## 2016-06-27 ENCOUNTER — Encounter: Payer: Self-pay | Admitting: Nurse Practitioner

## 2016-06-27 ENCOUNTER — Ambulatory Visit: Payer: Medicare Other | Attending: Anesthesiology | Admitting: Nurse Practitioner

## 2016-06-27 VITALS — BP 156/95 | HR 93 | Temp 98.2°F | Resp 16 | Ht 71.0 in | Wt 130.0 lb

## 2016-06-27 DIAGNOSIS — G894 Chronic pain syndrome: Secondary | ICD-10-CM | POA: Insufficient documentation

## 2016-06-27 DIAGNOSIS — Z9049 Acquired absence of other specified parts of digestive tract: Secondary | ICD-10-CM | POA: Insufficient documentation

## 2016-06-27 DIAGNOSIS — R109 Unspecified abdominal pain: Secondary | ICD-10-CM | POA: Diagnosis not present

## 2016-06-27 DIAGNOSIS — G8929 Other chronic pain: Secondary | ICD-10-CM

## 2016-06-27 DIAGNOSIS — Z79899 Other long term (current) drug therapy: Secondary | ICD-10-CM | POA: Diagnosis not present

## 2016-06-27 DIAGNOSIS — K5090 Crohn's disease, unspecified, without complications: Secondary | ICD-10-CM

## 2016-06-27 DIAGNOSIS — Z79891 Long term (current) use of opiate analgesic: Secondary | ICD-10-CM | POA: Diagnosis not present

## 2016-06-27 DIAGNOSIS — F17210 Nicotine dependence, cigarettes, uncomplicated: Secondary | ICD-10-CM | POA: Insufficient documentation

## 2016-06-27 DIAGNOSIS — Z9119 Patient's noncompliance with other medical treatment and regimen: Secondary | ICD-10-CM | POA: Diagnosis not present

## 2016-06-27 DIAGNOSIS — M5020 Other cervical disc displacement, unspecified cervical region: Secondary | ICD-10-CM | POA: Diagnosis not present

## 2016-06-27 DIAGNOSIS — Z5181 Encounter for therapeutic drug level monitoring: Secondary | ICD-10-CM | POA: Insufficient documentation

## 2016-06-27 DIAGNOSIS — K5080 Crohn's disease of both small and large intestine without complications: Secondary | ICD-10-CM | POA: Diagnosis not present

## 2016-06-27 MED ORDER — OXYCODONE-ACETAMINOPHEN 10-325 MG PO TABS: 1.0000 | ORAL_TABLET | Freq: Two times a day (BID) | ORAL | 0 refills | Status: DC | PRN

## 2016-06-27 NOTE — Patient Instructions (Addendum)
Script in hand for Percocet x 2____________________________________________________________________________________________  Medication Rules  Applies to: All patients receiving prescriptions (written or electronic).  Pharmacy of record: Pharmacy where electronic prescriptions will be sent. If written prescriptions are taken to a different pharmacy, please inform the nursing staff. The pharmacy listed in the electronic medical record should be the one where you would like electronic prescriptions to be sent.  Prescription refills: Only during scheduled appointments. Applies to both, written and electronic prescriptions.  NOTE: The following applies primarily to controlled substances (Opioid Pain Medications)  Patient's responsibilities: 1. Pain Pills: Bring all pain pills to every appointment (except for procedure appointments). 2. Pill Bottles: Bring pills in original pharmacy bottle. Always bring newest bottle. Bring bottle, even if empty. 3. Medication refills: You are responsible for knowing and keeping track of what medications you need refilled. The day before your appointment, write a list of all prescriptions that need to be refilled. Bring that list to your appointment and give it to the admitting nurse. Prescriptions will be written only during appointments. If you forget a medication, it will not be "Called in", "Faxed", or "electronically sent". You will need to get another appointment to get these prescribed. 4. Prescription Accuracy: You are responsible for carefully inspecting your prescriptions before leaving our office. Have the discharge nurse carefully go over each prescription with you, before taking them home. Make sure that your name is accurately spelled, that your address is correct. Check the name and dose of your medication to make sure it is accurate. Check the number of pills, and the written instructions to make sure they are clear and accurate. Make sure that you are  given enough medication to last until your next medication refill appointment. 5. Taking Medication: Take medication as prescribed. Never take more pills than instructed. Never take medication more frequently than prescribed. Taking less pills or less frequently is permitted and encouraged, when it comes to controlled substances (written prescriptions).  6. Inform other Doctors: Always inform, all of your healthcare providers, of all the medications you take. 7. Pain Medication from other Providers: You are not allowed to accept any additional pain medication from any other Doctor or Healthcare provider. There are two exceptions to this rule. (see below) In the event that you require additional pain medication, you are responsible for notifying us, as stated below. 8. Medication Agreement: You are responsible for carefully reading and following our Medication Agreement. This must be signed before receiving any prescriptions from our practice. Safely store a copy of your signed Agreement. Violations to the Agreement will result in no further prescriptions. (Additional copies of our Medication Agreement are available upon request.) 9. Laws, Rules, & Regulations: All patients are expected to follow all Federal and [employer], Rules, [employer]. Ignorance of the Laws does not constitute a valid excuse.  Exceptions: There are only two exceptions to the rule of not receiving pain medications from other Healthcare Providers. 1. Exception #1 (Emergencies): In the event of an emergency (i.e.: accident requiring emergency care), you are allowed to receive additional pain medication. However, you are responsible for: As soon as you are able, call our office [phone], at any time of the day or night, and leave a message stating your name, the date and nature of the emergency, and the name and dose of the medication prescribed. In the event that your call is answered by a member of our staff, make sure  to document and save the date, time,  and the name of the person that took your information.  2. Exception #2 (Planned Surgery): In the event that you are scheduled by another doctor or dentist to have any type of surgery or procedure, you are allowed (for a period no longer than 30 days), to receive additional pain medication, for the acute post-op pain. However, in this case, you are responsible for picking up a copy of our "Post-op Pain Management for Surgeons" handout, and giving it to your surgeon or dentist. This document is available at our office, and does not require an appointment to obtain it. Simply go to our office during business hours (Monday-Thursday from 8:00 AM to 4:00 PM) (Friday 8:00 AM to 12:00 Noon) or if you have a scheduled appointment with [place], prior to your surgery, and ask for it by name. In addition, you will need to provide [place] with your name, name of your surgeon, type of surgery, and date of procedure or surgery.  ____________________________________________________________________________________________ ____________________________________________________________________________________________  Pain Scale  Introduction: The pain score used by this practice is the Verbal Numerical Rating Scale (VNRS-11). This is an 11-point scale. It is for adults and children 10 years or older. There are significant differences in how the pain score is reported, used, and applied. Forget everything you learned in the past and learn this scoring system.  General Information: The scale should reflect your current level of pain. Unless you are specifically asked for the level of your worst pain, or your average pain. If you are asked for one of these two, then it should be understood that it is over the past 24 hours.  Basic Activities of Daily Living (ADL): Personal hygiene, dressing, eating, transferring, and using restroom.  Instructions: Most patients tend to report their level of pain  as a combination of two factors, their physical pain and their psychosocial pain. This last one is also known as "suffering" and it is reflection of how physical pain affects you socially and psychologically. From now on, report them separately. From this point on, when asked to report your pain level, report only your physical pain. Use the following table for reference.  Pain Clinic Pain Levels (0-5/10)  Pain Level Score  Description  No Pain 0   Mild pain 1 Nagging, annoying, but does not interfere with basic activities of daily living (ADL). Patients are able to eat, bathe, get dressed, toileting (being able to get on and off the toilet and perform personal hygiene functions), transfer (move in and out of bed or a chair without assistance), and maintain continence (able to control bladder and bowel functions). Blood pressure and heart rate are unaffected. A normal heart rate for a healthy adult ranges from 60 to 100 bpm (beats per minute).   Mild to moderate pain 2 Noticeable and distracting. Impossible to hide from other people. More frequent flare-ups. Still possible to adapt and function close to normal. It can be very annoying and may have occasional stronger flare-ups. With discipline, patients may get used to it and adapt.   Moderate pain 3 Interferes significantly with activities of daily living (ADL). It becomes difficult to feed, bathe, get dressed, get on and off the toilet or to perform personal hygiene functions. Difficult to get in and out of bed or a chair without assistance. Very distracting. With effort, it can be ignored when deeply involved in activities.   Moderately severe pain 4 Impossible to ignore for more than a few minutes. With effort, patients may still be able  to manage work or participate in some social activities. Very difficult to concentrate. Signs of autonomic nervous system discharge are evident: dilated pupils (mydriasis); mild sweating (diaphoresis); sleep  interference. Heart rate becomes elevated (>115 bpm). Diastolic blood pressure (lower number) rises above 100 mmHg. Patients find relief in laying down and not moving.   Severe pain 5 Intense and extremely unpleasant. Associated with frowning face and frequent crying. Pain overwhelms the senses.  Ability to do any activity or maintain social relationships becomes significantly limited. Conversation becomes difficult. Pacing back and forth is common, as getting into a comfortable position is nearly impossible. Pain wakes you up from deep sleep. Physical signs will be obvious: pupillary dilation; increased sweating; goosebumps; brisk reflexes; cold, clammy hands and feet; nausea, vomiting or dry heaves; loss of appetite; significant sleep disturbance with inability to fall asleep or to remain asleep. When persistent, significant weight loss is observed due to the complete loss of appetite and sleep deprivation.  Blood pressure and heart rate becomes significantly elevated. Caution: If elevated blood pressure triggers a pounding headache associated with blurred vision, then the patient should immediately seek attention at an urgent or emergency care unit, as these may be signs of an impending stroke.    Emergency Department Pain Levels (6-10/10)  Emergency Room Pain 6 Severely limiting. Requires emergency care and should not be seen or managed at an outpatient pain management facility. Communication becomes difficult and requires great effort. Assistance to reach the emergency department may be required. Facial flushing and profuse sweating along with potentially dangerous increases in heart rate and blood pressure will be evident.   Distressing pain 7 Self-care is very difficult. Assistance is required to transport, or use restroom. Assistance to reach the emergency department will be required. Tasks requiring coordination, such as bathing and getting dressed become very difficult.   Disabling pain 8  Self-care is no longer possible. At this level, pain is disabling. The individual is unable to do even the most "basic" activities such as walking, eating, bathing, dressing, transferring to a bed, or toileting. Fine motor skills are lost. It is difficult to think clearly.   Incapacitating pain 9 Pain becomes incapacitating. Thought processing is no longer possible. Difficult to remember your own name. Control of movement and coordination are lost.   The worst pain imaginable 10 At this level, most patients pass out from pain. When this level is reached, collapse of the autonomic nervous system occurs, leading to a sudden drop in blood pressure and heart rate. This in turn results in a temporary and dramatic drop in blood flow to the brain, leading to a loss of consciousness. Fainting is one of the body's self defense mechanisms. Passing out puts the brain in a calmed state and causes it to shut down for a while, in order to begin the healing process.    Summary: 1. Refer to this scale when providing [place] with your pain level. 2. Be accurate and careful when reporting your pain level. This will help with your care. 3. Over-reporting your pain level will lead to loss of credibility. 4. Even a level of 1/10 means that there is pain and will be treated at our facility. 5. High, inaccurate reporting will be documented as "Symptom Exaggeration", leading to loss of credibility and suspicions of possible secondary gains such as obtaining more narcotics, or wanting to appear disabled, for fraudulent reasons. 6. Only pain levels of 5 or below will be seen at our facility. 7. Pain levels  of 6 and above will be sent to the Emergency Department and the appointment cancelled. ____________________________________________________________________________________________

## 2016-06-27 NOTE — Progress Notes (Signed)
Nursing Pain Medication Assessment:  Safety precautions to be maintained throughout the outpatient stay will include: orient to surroundings, keep bed in low position, maintain call bell within reach at all times, provide assistance with transfer out of bed and ambulation.  Medication Inspection Compliance: Pill count conducted under aseptic conditions, in front of the patient. Neither the pills nor the bottle was removed from the patient's sight at any time. Once count was completed pills were immediately returned to the patient in their original bottle.  Medication: See above Pill/Patch Count: 0 of 60 pills remain Pill/Patch Appearance: Markings consistent with prescribed medication Bottle Appearance: Standard pharmacy container. Clearly labeled. Filled Date: 04/28 / 2018 Last Medication intake:  Ran out of medicine more than 48 hours ago

## 2016-06-27 NOTE — Progress Notes (Signed)
Patient's Name: Benjamin Murillo  MRN: [id number]  Referring Provider: Gennette Pac, FNP  DOB: Aug 25, 1970  PCP: Gennette Pac, FNP  DOS: 06/27/2016  Note by: Vevelyn Francois NP  Service setting: Ambulatory outpatient  Specialty: Interventional Pain Management  Location: [facility] (AMB) Pain Management Facility    Patient type: Established    Primary Reason(s) for Visit: Encounter for prescription drug management (Level of risk: moderate) CC: Abdominal Pain  HPI  Mr. Clingerman is a 46 y.o. year old, male patient, who comes today for a medication management evaluation. He has Crohn's ileitis ([place]); Chronic abdominal pain; Acute abdominal pain; Tobacco abuse; Noncompliance; HNP (herniated nucleus pulposus), cervical; Crohn's colitis ([place]); AP (abdominal pain); Crohn disease ([place]); Diarrhea; Disorder of intestine; Disorder of stomach and duodenum; Drug dependence ([place]); Duodenal ulcer; Status post intestinal bypass or anastomosis; Mixed, or nondependent drug abuse, in remission; Nausea with vomiting; Personal history of digestive disease; S/P excision of lipoma; Chronic pain syndrome; and Long term current use of opiate analgesic on his problem list. His primarily concern today is the Abdominal Pain  Pain Assessment: Self-Reported Pain Score: 9 /10 Clinically the patient looks like a 2/10 Reported level is inconsistent with clinical observations. Information on the proper use of the pain scale provided to the patient today Pain Type: Chronic pain Pain Location: Abdomen Pain Orientation: Mid Pain Descriptors / Indicators: [place] Ard, Cramping, Throbbing  Mr. Nobrega was last scheduled for an appointment on 03/26/16 for medication management. During today's appointment we reviewed Mr. Cheramie chronic pain status, as well as his outpatient medication regimen.He has chronic abdominal pain secondary to Crohn's disease. He is status post bowel resection 2. He admits that his abdominal pain is stable. He denies any  side effects of his medications. He denies any problems or concerns today.  The patient  reports that he does not use drugs. His body mass index is 18.13 kg/m.  Further details on both, my assessment(s), as well as the proposed treatment plan, please see below.  Controlled Substance Pharmacotherapy Assessment REMS (Risk Evaluation and Mitigation Strategy)  Analgesic: Oxycodone/acetaminophen 10/325 every 12 hours (oxycodone 20 mg per day) MME/day: 30 mg/day.  Ignatius Specking, RN  06/27/2016  2:00 PM  Sign at close encounter Nursing Pain Medication Assessment:  Safety precautions to be maintained throughout the outpatient stay will include: orient to surroundings, keep bed in low position, maintain call bell within reach at all times, provide assistance with transfer out of bed and ambulation.  Medication Inspection Compliance: Pill count conducted under aseptic conditions, in front of the patient. Neither the pills nor the bottle was removed from the patient's sight at any time. Once count was completed pills were immediately returned to the patient in their original bottle.  Medication: See above Pill/Patch Count: 0 of 60 pills remain Pill/Patch Appearance: Markings consistent with prescribed medication Bottle Appearance: Standard pharmacy container. Clearly labeled. Filled Date: 04/28 / 2018 Last Medication intake:  Ran out of medicine more than 48 hours ago   Pharmacokinetics: Liberation and absorption (onset of action): WNL Distribution (time to peak effect): WNL Metabolism and excretion (duration of action): WNL         Pharmacodynamics: Desired effects: Analgesia: Mr. Reidel reports >50% benefit. Functional ability: Patient reports that medication allows him to accomplish basic ADLs Clinically meaningful improvement in function (CMIF): Sustained CMIF goals met Perceived effectiveness: Described as relatively effective, allowing for increase in activities of daily living  (ADL) Undesirable effects: Side-effects or Adverse reactions: None reported Monitoring:  [place] PMP: Online review of the past 35-monthperiod conducted. Compliant with practice rules and regulations List of all UDS test(s) done:  Lab Results  Component Value Date   TOXASSSELUR FINAL 12/25/2015   TOXASSSELUR FINAL 07/06/2015   TOXASSSELUR FINAL 05/18/2015   Last UDS on record: ToxAssure Select 13  Date Value Ref Range Status  12/25/2015 FINAL  Final    Comment:    ==================================================================== TOXASSURE SELECT 13 (MW) ==================================================================== Test                             Result       Flag       Units Drug Present not Declared for Prescription Verification   Oxycodone                      1119         UNEXPECTED ng/mg creat   Oxymorphone                    1944         UNEXPECTED ng/mg creat   Noroxycodone                   >3571        UNEXPECTED ng/mg creat   Noroxymorphone                 690          UNEXPECTED ng/mg creat    Sources of oxycodone are scheduled prescription medications.    Oxymorphone, noroxycodone, and noroxymorphone are expected    metabolites of oxycodone. Oxymorphone is also available as a    scheduled prescription medication. ==================================================================== Test                      Result    Flag   Units      Ref Range   Creatinine              280              mg/dL      >=20 ==================================================================== Declared Medications:  The flagging and interpretation on this report are based on the  following declared medications.  Unexpected results may arise from  inaccuracies in the declared medications.  **Note: The testing scope of this panel does not include following  reported medications:  Adalimumab  Ciprofloxacin (Cipro)   Prednisone ==================================================================== For clinical consultation, please call ([phone] ====================================================================    UDS interpretation: Compliant          Medication Assessment Form: Reviewed. Patient indicates being compliant with therapy Treatment compliance: Compliant Risk Assessment Profile: Aberrant behavior: See prior evaluations. None observed or detected today Comorbid factors increasing risk of overdose: See prior notes. No additional risks detected today Risk of substance use disorder (SUD): Low Opioid Risk Tool (ORT) Total Score: 2  Interpretation Table:  Score <3 = Low Risk for SUD  Score between 4-7 = Moderate Risk for SUD  Score >8 = High Risk for Opioid Abuse   Risk Mitigation Strategies:  Patient Counseling: Covered Patient-Prescriber Agreement (PPA): Present and active  Notification to other healthcare providers: Done  Pharmacologic Plan: No change in therapy, at this time  Laboratory Chemistry  Inflammation Markers Lab Results  Component Value Date   CRP 0.5 10/11/2015   ESRSEDRATE 13 10/12/2015   (CRP: Acute Phase) (ESR: Chronic Phase) Renal Function Markers  Lab Results  Component Value Date   BUN 11 10/13/2015   CREATININE 0.94 10/13/2015   GFRAA >60 10/13/2015   GFRNONAA >60 10/13/2015   Hepatic Function Markers Lab Results  Component Value Date   AST 21 10/11/2015   ALT 15 (L) 10/11/2015   ALBUMIN 4.8 10/11/2015   ALKPHOS 84 10/11/2015   Electrolytes Lab Results  Component Value Date   NA 133 (L) 10/13/2015   K 4.0 10/13/2015   CL 104 10/13/2015   CALCIUM 8.3 (L) 10/13/2015   MG 1.7 09/02/2007   Neuropathy Markers No results found for: [id number] Bone Pathology Markers Lab Results  Component Value Date   ALKPHOS 84 10/11/2015   CALCIUM 8.3 (L) 10/13/2015   Coagulation Parameters Lab Results  Component Value Date   PLT 226 10/13/2015    Cardiovascular Markers Lab Results  Component Value Date   HGB 12.0 (L) 10/13/2015   HCT 34.1 (L) 10/13/2015   Note: Lab results reviewed.  Recent Diagnostic Imaging Review  No results found. Note: Imaging results reviewed.          Meds  The patient has a current medication list which includes the following prescription(s): humira pen, nicotine, oxycodone-acetaminophen, and oxycodone-acetaminophen.  Current Outpatient Prescriptions on File Prior to Visit  Medication Sig  . HUMIRA PEN 40 MG/0.8ML injection Inject 40 mg as directed every 14 (fourteen) days.   . nicotine (NICODERM CQ - DOSED IN MG/24 HOURS) 14 mg/24hr patch Place 1 patch (14 mg total) onto the skin daily.   No current facility-administered medications on file prior to visit.    ROS  Constitutional: Denies any fever or chills Gastrointestinal: No reported hemesis, hematochezia, vomiting, or acute GI distress Musculoskeletal: Denies any acute onset joint swelling, redness, loss of ROM, or weakness Neurological: No reported episodes of acute onset apraxia, aphasia, dysarthria, agnosia, amnesia, paralysis, loss of coordination, or loss of consciousness  Allergies  Mr. Wengert is allergic to vicodin [hydrocodone-acetaminophen]; tramadol; and morphine and related.  [place]  Drug: Mr. Spanos  reports that he does not use drugs. Alcohol:  reports that he does not drink alcohol. Tobacco:  reports that he has been smoking Cigarettes.  He has a 2.20 pack-year smoking history. He has never used smokeless tobacco. Medical:  has a past medical history of Cervical radiculopathy; Chronic abdominal pain; Chronic neck pain; Cold; Crohn disease ([place]); GERD (gastroesophageal reflux disease); Hand fracture, left; Headache(784.0); Hypertension; IBS (irritable bowel syndrome); and Shortness of breath. Family: family history includes Cancer in his maternal aunt; Diabetes in his paternal aunt; Hyperlipidemia in his mother.  Past Surgical  History:  Procedure Laterality Date  . ANTERIOR CERVICAL DECOMP/DISCECTOMY FUSION N/A 02/12/2013   Procedure: ANTERIOR CERVICAL DECOMPRESSION/DISCECTOMY FUSION 1 LEVEL;  Surgeon: Winfield Cunas, MD;  Location: [facility];  Service: Neurosurgery;  Laterality: N/A;  C67 anterior cervical decompression with fusion plating and bonegraft  . BOWEL RESECTION     x2  . COLON SURGERY     drains placed to drain cyst  . COLONOSCOPY    . COLONOSCOPY WITH PROPOFOL N/A 11/13/2015   Procedure: COLONOSCOPY WITH PROPOFOL;  Surgeon: Manya Silvas, MD;  [facility]COPY;  Service: Endoscopy;  Laterality: N/A;  . ESOPHAGOGASTRODUODENOSCOPY (EGD) WITH PROPOFOL N/A 11/13/2015   Procedure: ESOPHAGOGASTRODUODENOSCOPY (EGD) WITH PROPOFOL;  Surgeon: Manya Silvas, MD;  Location: [facility] ENDOSCOPY;  Service: Endoscopy;  Laterality: N/A;  . LIPOMA EXCISION Right 01/24/2016   Procedure: EXCISION LIPOMA;  Surgeon: Olean Ree, MD;  Location:  [facility] ORS;  Service: General;  Laterality: Right;  right back lipoma excision, patient should be prone  . NEVUS EXCISION     left arm + hand   Constitutional Exam  General appearance: Well nourished, well developed, and well hydrated. In no apparent acute distress Vitals:   06/27/16 1349  BP: (!) 156/95  Pulse: 93  Resp: 16  Temp: 98.2 F (36.8 C)  SpO2: 100%  Weight: 130 lb (59 kg)  Height: _0  (1.803 m)   BMI Assessment: Estimated body mass index is 18.13 kg/m as calculated from the following:   Height as of this encounter: _1  (1.803 m).   Weight as of this encounter: 130 lb (59 kg).  BMI interpretation table: BMI level Category Range association with higher incidence of chronic pain  <18 kg/m2 Underweight   18.5-24.9 kg/m2 Ideal body weight   25-29.9 kg/m2 Overweight Increased incidence by 20%  30-34.9 kg/m2 Obese (Class I) Increased incidence by 68%  35-39.9 kg/m2 Severe obesity (Class II) Increased incidence by 136%  >40 kg/m2 Extreme obesity  (Class III) Increased incidence by 254%   BMI Readings from Last 4 Encounters:  06/27/16 18.13 kg/m  03/26/16 19.53 kg/m  02/07/16 18.66 kg/m  01/24/16 18.97 kg/m   Wt Readings from Last 4 Encounters:  06/27/16 130 lb (59 kg)  03/26/16 140 lb (63.5 kg)  02/07/16 133 lb 12.8 oz (60.7 kg)  01/24/16 136 lb (61.7 kg)  Psych/Mental status: Alert, oriented x 3 (person, place, & time)       Eyes: PERLA Respiratory: No evidence of acute respiratory distress  Cervical Spine Exam  Inspection: No masses, redness, or swelling Alignment: Symmetrical Functional ROM: Unrestricted ROM      Stability: No instability detected Muscle strength & Tone: Functionally intact Sensory: Unimpaired Palpation: No palpable anomalies              Upper Extremity (UE) Exam    Side: Right upper extremity  Side: Left upper extremity  Inspection: No masses, redness, swelling, or asymmetry. No contractures  Inspection: No masses, redness, swelling, or asymmetry. No contractures  Functional ROM: Unrestricted ROM          Functional ROM: Unrestricted ROM          Muscle strength & Tone: Functionally intact  Muscle strength & Tone: Functionally intact  Sensory: Unimpaired  Sensory: Unimpaired  Palpation: No palpable anomalies              Palpation: No palpable anomalies              Specialized Test(s): Deferred         Specialized Test(s): Deferred          Thoracic Spine Exam  Inspection: No masses, redness, or swelling Alignment: Symmetrical Functional ROM: Unrestricted ROM Stability: No instability detected Sensory: Unimpaired Muscle strength & Tone: No palpable anomalies  Gait & Posture Assessment  Ambulation: Unassisted Gait: Relatively normal for age and body habitus Posture: WNL   Lower Extremity Exam    Side: Right lower extremity  Side: Left lower extremity  Inspection: No masses, redness, swelling, or asymmetry. No contractures  Inspection: No masses, redness, swelling, or asymmetry. No  contractures  Functional ROM: Unrestricted ROM          Functional ROM: Unrestricted ROM          Muscle strength & Tone: Functionally intact  Muscle strength & Tone: Functionally intact  Sensory: Unimpaired  Sensory: Unimpaired  Palpation: No palpable  anomalies  Palpation: No palpable anomalies   Assessment  Primary Diagnosis & Pertinent Problem List: The primary encounter diagnosis was Crohn's disease without complication, unspecified gastrointestinal tract location [place]). Diagnoses of Chronic abdominal pain, Chronic pain syndrome, and Long term current use of opiate analgesic were also pertinent to this visit.  Status Diagnosis  Controlled Controlled Controlled 1. Crohn's disease without complication, unspecified gastrointestinal tract location ([place])   2. Chronic abdominal pain   3. Chronic pain syndrome   4. Long term current use of opiate analgesic     Problems updated and reviewed during this visit: Problem  Chronic Pain Syndrome  Hnp (Herniated Nucleus Pulposus), Cervical  Chronic Abdominal Pain  Long Term Current Use of Opiate Analgesic  Noncompliance  Drug Dependence ([facility])  S/P Excision of Lipoma   Right lower back   Ap (Abdominal Pain)  Crohn disease ([facility])  Crohn's colitis ([facility])  Tobacco Abuse  Acute Abdominal Pain  Crohn's ileitis ([facility])  Disorder of Intestine  Disorder of Stomach and Duodenum  Status Post Intestinal Bypass Or Anastomosis  Duodenal Ulcer  Mixed, Or Nondependent Drug Abuse, in Remission  Personal History of Digestive Disease  Nausea With Vomiting  Diarrhea  Lipoma of Back (Resolved)   Plan of Care  Pharmacotherapy (Medications Ordered): Meds ordered this encounter  Medications  . oxyCODONE-acetaminophen (PERCOCET) 10-325 MG tablet    Sig: Take 1 tablet by mouth every 12 (twelve) hours as needed for pain.    Dispense:  60 tablet    Refill:  0    Do not fill until 06/27/2016    Order Specific Question:   Supervising Provider    Answer:    Milinda Pointer [phone]  . oxyCODONE-acetaminophen (PERCOCET) 10-325 MG tablet    Sig: Take 1 tablet by mouth every 12 (twelve) hours as needed for pain.    Dispense:  60 tablet    Refill:  0    Do not fill until 07/27/2016    Order Specific Question:   Supervising Provider    Answer:   [name] [[id number]]   New Prescriptions   No medications on file   Medications administered today: Mr. Covington had no medications administered during this visit. Lab-work, procedure(s), and/or referral(s): No orders of the defined types were placed in this encounter.  Imaging and/or referral(s): None  Interventional therapies: Planned, scheduled, and/or pending:   Not at this time.   Considering:   Not at this time    Palliative PRN treatment(s):   None at this time    Provider-requested follow-up: Return in about 2 months (around 08/27/2016) for Medication Mgmt.  Future Appointments Date Time Provider [place]  08/22/2016 2:30 PM [name], MD [facility] None   Primary Care Physician: Gennette Pac, FNP Location: [facility] Outpatient [facility] Management Facility Note by: Vevelyn Francois NP Date: 06/27/2016; Time: 2:29 PM  Pain Score Disclaimer: We use the NRS-11 scale. This is a self-reported, subjective measurement of pain severity with only modest accuracy. It is used primarily to identify changes within a particular patient. It must be understood that outpatient pain scales are significantly less accurate that those used for research, where they can be applied under ideal controlled circumstances with minimal exposure to variables. In reality, the score is likely to be a combination of pain intensity and pain affect, where pain affect describes the degree of emotional arousal or changes in action readiness caused by the sensory experience of pain. Factors such as social and work situation, setting,  emotional state, anxiety levels, expectation, and prior pain experience may  influence pain perception and show large inter-individual differences that may also be affected by time variables.  Patient instructions provided during this appointment: Patient Instructions     Script in hand for Percocet x 2____________________________________________________________________________________________  Medication Rules  Applies to: All patients receiving prescriptions (written or electronic).  Pharmacy of record: Pharmacy where electronic prescriptions will be sent. If written prescriptions are taken to a different pharmacy, please inform the nursing staff. The pharmacy listed in the electronic medical record should be the one where you would like electronic prescriptions to be sent.  Prescription refills: Only during scheduled appointments. Applies to both, written and electronic prescriptions.  NOTE: The following applies primarily to controlled substances (Opioid Pain Medications)  Patient's responsibilities: 1. Pain Pills: Bring all pain pills to every appointment (except for procedure appointments). 2. Pill Bottles: Bring pills in original pharmacy bottle. Always bring newest bottle. Bring bottle, even if empty. 3. Medication refills: You are responsible for knowing and keeping track of what medications you need refilled. The day before your appointment, write a list of all prescriptions that need to be refilled. Bring that list to your appointment and give it to the admitting nurse. Prescriptions will be written only during appointments. If you forget a medication, it will not be "Called in", "Faxed", or "electronically sent". You will need to get another appointment to get these prescribed. 4. Prescription Accuracy: You are responsible for carefully inspecting your prescriptions before leaving our office. Have the discharge nurse carefully go over each prescription with you, before taking them home. Make sure that your name is accurately spelled, that your address is  correct. Check the name and dose of your medication to make sure it is accurate. Check the number of pills, and the written instructions to make sure they are clear and accurate. Make sure that you are given enough medication to last until your next medication refill appointment. 5. Taking Medication: Take medication as prescribed. Never take more pills than instructed. Never take medication more frequently than prescribed. Taking less pills or less frequently is permitted and encouraged, when it comes to controlled substances (written prescriptions).  6. Inform other Doctors: Always inform, all of your healthcare providers, of all the medications you take. 7. Pain Medication from other Providers: You are not allowed to accept any additional pain medication from any other Doctor or Healthcare provider. There are two exceptions to this rule. (see below) In the event that you require additional pain medication, you are responsible for notifying us, as stated below. 8. Medication Agreement: You are responsible for carefully reading and following our Medication Agreement. This must be signed before receiving any prescriptions from our practice. Safely store a copy of your signed Agreement. Violations to the Agreement will result in no further prescriptions. (Additional copies of our Medication Agreement are available upon request.) 9. Laws, Rules, & Regulations: All patients are expected to follow all Federal and [employer], Rules, [employer]. Ignorance of the Laws does not constitute a valid excuse.  Exceptions: There are only two exceptions to the rule of not receiving pain medications from other Healthcare Providers. 1. Exception #1 (Emergencies): In the event of an emergency (i.e.: accident requiring emergency care), you are allowed to receive additional pain medication. However, you are responsible for: As soon as you are able, call our office [phone], at any time of the day or night,  and leave a message stating your name,  the date and nature of the emergency, and the name and dose of the medication prescribed. In the event that your call is answered by a member of our staff, make sure to document and save the date, time, and the name of the person that took your information.  2. Exception #2 (Planned Surgery): In the event that you are scheduled by another doctor or dentist to have any type of surgery or procedure, you are allowed (for a period no longer than 30 days), to receive additional pain medication, for the acute post-op pain. However, in this case, you are responsible for picking up a copy of our "Post-op Pain Management for Surgeons" handout, and giving it to your surgeon or dentist. This document is available at our office, and does not require an appointment to obtain it. Simply go to our office during business hours (Monday-Thursday from 8:00 AM to 4:00 PM) (Friday 8:00 AM to 12:00 Noon) or if you have a scheduled appointment with [place], prior to your surgery, and ask for it by name. In addition, you will need to provide [place] with your name, name of your surgeon, type of surgery, and date of procedure or surgery.  ____________________________________________________________________________________________ ____________________________________________________________________________________________  Pain Scale  Introduction: The pain score used by this practice is the Verbal Numerical Rating Scale (VNRS-11). This is an 11-point scale. It is for adults and children 10 years or older. There are significant differences in how the pain score is reported, used, and applied. Forget everything you learned in the past and learn this scoring system.  General Information: The scale should reflect your current level of pain. Unless you are specifically asked for the level of your worst pain, or your average pain. If you are asked for one of these two, then it should be understood that it is  over the past 24 hours.  Basic Activities of Daily Living (ADL): Personal hygiene, dressing, eating, transferring, and using restroom.  Instructions: Most patients tend to report their level of pain as a combination of two factors, their physical pain and their psychosocial pain. This last one is also known as "suffering" and it is reflection of how physical pain affects you socially and psychologically. From now on, report them separately. From this point on, when asked to report your pain level, report only your physical pain. Use the following table for reference.  Pain Clinic Pain Levels (0-5/10)  Pain Level Score  Description  No Pain 0   Mild pain 1 Nagging, annoying, but does not interfere with basic activities of daily living (ADL). Patients are able to eat, bathe, get dressed, toileting (being able to get on and off the toilet and perform personal hygiene functions), transfer (move in and out of bed or a chair without assistance), and maintain continence (able to control bladder and bowel functions). Blood pressure and heart rate are unaffected. A normal heart rate for a healthy adult ranges from 60 to 100 bpm (beats per minute).   Mild to moderate pain 2 Noticeable and distracting. Impossible to hide from other people. More frequent flare-ups. Still possible to adapt and function close to normal. It can be very annoying and may have occasional stronger flare-ups. With discipline, patients may get used to it and adapt.   Moderate pain 3 Interferes significantly with activities of daily living (ADL). It becomes difficult to feed, bathe, get dressed, get on and off the toilet or to perform personal hygiene functions. Difficult to get in and out of bed or  a chair without assistance. Very distracting. With effort, it can be ignored when deeply involved in activities.   Moderately severe pain 4 Impossible to ignore for more than a few minutes. With effort, patients may still be able to manage work  or participate in some social activities. Very difficult to concentrate. Signs of autonomic nervous system discharge are evident: dilated pupils (mydriasis); mild sweating (diaphoresis); sleep interference. Heart rate becomes elevated (>115 bpm). Diastolic blood pressure (lower number) rises above 100 mmHg. Patients find relief in laying down and not moving.   Severe pain 5 Intense and extremely unpleasant. Associated with frowning face and frequent crying. Pain overwhelms the senses.  Ability to do any activity or maintain social relationships becomes significantly limited. Conversation becomes difficult. Pacing back and forth is common, as getting into a comfortable position is nearly impossible. Pain wakes you up from deep sleep. Physical signs will be obvious: pupillary dilation; increased sweating; goosebumps; brisk reflexes; cold, clammy hands and feet; nausea, vomiting or dry heaves; loss of appetite; significant sleep disturbance with inability to fall asleep or to remain asleep. When persistent, significant weight loss is observed due to the complete loss of appetite and sleep deprivation.  Blood pressure and heart rate becomes significantly elevated. Caution: If elevated blood pressure triggers a pounding headache associated with blurred vision, then the patient should immediately seek attention at an urgent or emergency care unit, as these may be signs of an impending stroke.    Emergency Department Pain Levels (6-10/10)  Emergency Room Pain 6 Severely limiting. Requires emergency care and should not be seen or managed at an outpatient pain management facility. Communication becomes difficult and requires great effort. Assistance to reach the emergency department may be required. Facial flushing and profuse sweating along with potentially dangerous increases in heart rate and blood pressure will be evident.   Distressing pain 7 Self-care is very difficult. Assistance is required to transport, or  use restroom. Assistance to reach the emergency department will be required. Tasks requiring coordination, such as bathing and getting dressed become very difficult.   Disabling pain 8 Self-care is no longer possible. At this level, pain is disabling. The individual is unable to do even the most "basic" activities such as walking, eating, bathing, dressing, transferring to a bed, or toileting. Fine motor skills are lost. It is difficult to think clearly.   Incapacitating pain 9 Pain becomes incapacitating. Thought processing is no longer possible. Difficult to remember your own name. Control of movement and coordination are lost.   The worst pain imaginable 10 At this level, most patients pass out from pain. When this level is reached, collapse of the autonomic nervous system occurs, leading to a sudden drop in blood pressure and heart rate. This in turn results in a temporary and dramatic drop in blood flow to the brain, leading to a loss of consciousness. Fainting is one of the body's self defense mechanisms. Passing out puts the brain in a calmed state and causes it to shut down for a while, in order to begin the healing process.    Summary: 1. Refer to this scale when providing [place] with your pain level. 2. Be accurate and careful when reporting your pain level. This will help with your care. 3. Over-reporting your pain level will lead to loss of credibility. 4. Even a level of 1/10 means that there is pain and will be treated at our facility. 5. High, inaccurate reporting will be documented as "Symptom Exaggeration", leading to  loss of credibility and suspicions of possible secondary gains such as obtaining more narcotics, or wanting to appear disabled, for fraudulent reasons. 6. Only pain levels of 5 or below will be seen at our facility. 7. Pain levels of 6 and above will be sent to the Emergency Department and the appointment  cancelled. ____________________________________________________________________________________________

## 2016-08-07 ENCOUNTER — Observation Stay
Admission: EM | Admit: 2016-08-07 | Discharge: 2016-08-10 | Disposition: A | Discharge status: Home / Self Care | Source: Intra-hospital

## 2016-08-07 ENCOUNTER — Observation Stay
Admission: EM | Admit: 2016-08-07 | Discharge: 2016-08-10 | Disposition: A | Discharge status: Home / Self Care | Payer: MEDICARE | Source: Intra-hospital

## 2016-08-07 ENCOUNTER — Observation Stay
Admission: EM | Admit: 2016-08-07 | Discharge: 2016-08-10 | Disposition: A | Discharge status: Home / Self Care | Payer: MEDICARE | Source: Intra-hospital | Admitting: Anesthesiology

## 2016-08-08 DIAGNOSIS — K921 Melena: Principal | ICD-10-CM

## 2016-08-09 DIAGNOSIS — K921 Melena: Principal | ICD-10-CM

## 2016-08-10 MED ORDER — PROMETHAZINE 12.5 MG TABLET
ORAL_TABLET | Freq: Four times a day (QID) | ORAL | 0 refills | 0.00000 days | Status: CP | PRN
Start: 2016-08-10 — End: 2016-08-15

## 2016-08-10 MED ORDER — PREDNISONE 10 MG TABLET
ORAL_TABLET | ORAL | 0 refills | 0.00000 days | Status: CP
Start: 2016-08-10 — End: 2016-09-06

## 2016-08-10 MED ORDER — OXYCODONE 10 MG TABLET
ORAL_TABLET | ORAL | 0 refills | 0.00000 days | Status: CP | PRN
Start: 2016-08-10 — End: 2016-08-15

## 2016-08-12 ENCOUNTER — Telehealth: Payer: Self-pay | Admitting: Anesthesiology

## 2016-08-12 NOTE — Telephone Encounter (Signed)
Dr. Kaleen Mask called stating patient was admitted to [facility] for severe Crohns Disease, when released he was given a script for 3-5- days of Oxycodone for pain. Wanted to let us know so no contract would be broken. Also asked Mr. Raczkowski to call as well, which he did call and leave a msg as well. If you have any questions Dr. Grayling Congress said do not hesitate to call him

## 2016-08-22 ENCOUNTER — Encounter: Payer: Self-pay | Admitting: Anesthesiology

## 2016-08-22 ENCOUNTER — Ambulatory Visit: Payer: Medicare Other | Attending: Anesthesiology | Admitting: Anesthesiology

## 2016-08-22 VITALS — BP 146/97 | HR 94 | Temp 98.7°F | Resp 16 | Ht 71.0 in | Wt 129.0 lb

## 2016-08-22 DIAGNOSIS — F1190 Opioid use, unspecified, uncomplicated: Secondary | ICD-10-CM | POA: Diagnosis not present

## 2016-08-22 DIAGNOSIS — K219 Gastro-esophageal reflux disease without esophagitis: Secondary | ICD-10-CM | POA: Diagnosis not present

## 2016-08-22 DIAGNOSIS — Z79891 Long term (current) use of opiate analgesic: Secondary | ICD-10-CM | POA: Insufficient documentation

## 2016-08-22 DIAGNOSIS — Z981 Arthrodesis status: Secondary | ICD-10-CM | POA: Insufficient documentation

## 2016-08-22 DIAGNOSIS — Z9889 Other specified postprocedural states: Secondary | ICD-10-CM | POA: Diagnosis not present

## 2016-08-22 DIAGNOSIS — F17210 Nicotine dependence, cigarettes, uncomplicated: Secondary | ICD-10-CM | POA: Insufficient documentation

## 2016-08-22 DIAGNOSIS — G894 Chronic pain syndrome: Secondary | ICD-10-CM

## 2016-08-22 DIAGNOSIS — R109 Unspecified abdominal pain: Secondary | ICD-10-CM | POA: Diagnosis not present

## 2016-08-22 DIAGNOSIS — M5412 Radiculopathy, cervical region: Secondary | ICD-10-CM | POA: Insufficient documentation

## 2016-08-22 DIAGNOSIS — M47816 Spondylosis without myelopathy or radiculopathy, lumbar region: Secondary | ICD-10-CM

## 2016-08-22 DIAGNOSIS — G8929 Other chronic pain: Secondary | ICD-10-CM

## 2016-08-22 DIAGNOSIS — R51 Headache: Secondary | ICD-10-CM | POA: Diagnosis not present

## 2016-08-22 DIAGNOSIS — M4696 Unspecified inflammatory spondylopathy, lumbar region: Secondary | ICD-10-CM | POA: Diagnosis not present

## 2016-08-22 DIAGNOSIS — K5090 Crohn's disease, unspecified, without complications: Secondary | ICD-10-CM

## 2016-08-22 DIAGNOSIS — Z833 Family history of diabetes mellitus: Secondary | ICD-10-CM | POA: Insufficient documentation

## 2016-08-22 DIAGNOSIS — I10 Essential (primary) hypertension: Secondary | ICD-10-CM | POA: Insufficient documentation

## 2016-08-22 DIAGNOSIS — Z809 Family history of malignant neoplasm, unspecified: Secondary | ICD-10-CM | POA: Insufficient documentation

## 2016-08-22 DIAGNOSIS — M542 Cervicalgia: Secondary | ICD-10-CM | POA: Diagnosis not present

## 2016-08-22 MED ORDER — OXYCODONE-ACETAMINOPHEN 10-325 MG PO TABS: 1.0000 | ORAL_TABLET | Freq: Three times a day (TID) | ORAL | 0 refills | Status: DC | PRN

## 2016-08-22 NOTE — Progress Notes (Signed)
Nursing Pain Medication Assessment:  Safety precautions to be maintained throughout the outpatient stay will include: orient to surroundings, keep bed in low position, maintain call bell within reach at all times, provide assistance with transfer out of bed and ambulation.  Medication Inspection Compliance: Pill count conducted under aseptic conditions, in front of the patient. Neither the pills nor the bottle was removed from the patient's sight at any time. Once count was completed pills were immediately returned to the patient in their original bottle.  Medication: Oxycodone/APAP Pill/Patch Count: 6 of 60 pills remain Pill/Patch Appearance: Markings consistent with prescribed medication Bottle Appearance: Standard pharmacy container. Clearly labeled. Filled Date06 / 30 / 2018 Last Medication intake:  Today

## 2016-08-23 NOTE — Progress Notes (Signed)
Subjective:  Patient ID: Benjamin Murillo, male    DOB: 01-23-1971  Age: 46 y.o. MRN: [id number]  CC: Abdominal Pain   Service Provided on Last Visit: Med Refill PROCEDURE:None  HPI Benjamin Murillo presents for reevaluation. Benjamin Murillo was last seen approximately 2 months ago and continues to take his oxycodone 10 mg tablets 2 times a day. Benjamin Murillo has had some recent exacerbation of his Crohn's disease and has had more pain and abdominal discomfort from that. This pain is currently under evaluation with [facility]. Benjamin Murillo has had a recent colonoscopy and has had more inflammation and this is been a problem for him. His abdominal pain is a gnawing aching centralized abdominal region pain and Benjamin Murillo continues to have some intermittent low back pain as well. The quality characteristic and distribution of his low back pain a been stable in nature no new changes in lower extremity strength or function are noted bowel bladder function has been stable as well. Taste on his narcotic assessment sheet Benjamin Murillo is tolerating his medications well with no untoward side effects.  History Benjamin Murillo has a past medical history of Cervical radiculopathy; Chronic abdominal pain; Chronic neck pain; Cold; Crohn disease ([place]); GERD (gastroesophageal reflux disease); Hand fracture, left; Headache(784.0); Hypertension; IBS (irritable bowel syndrome); and Shortness of breath.   Benjamin Murillo has a past surgical history that includes Bowel resection; Nevus excision; Anterior cervical decomp/discectomy fusion (N/A, 02/12/2013); Colonoscopy; Colonoscopy with propofol (N/A, 11/13/2015); Esophagogastroduodenoscopy (egd) with propofol (N/A, 11/13/2015); Colon surgery; and Lipoma excision (Right, 01/24/2016).   His family history includes Cancer in his maternal aunt; Diabetes in his paternal aunt; Hyperlipidemia in his mother.Benjamin Murillo reports that Benjamin Murillo has been smoking Cigarettes.  Benjamin Murillo has a 2.20 pack-year smoking history. Benjamin Murillo has never used smokeless tobacco. Benjamin Murillo  reports that Benjamin Murillo does not drink alcohol or use drugs.  No results found for this or any previous visit.  ToxAssure Select 13  Date Value Ref Range Status  12/25/2015 FINAL  Final    Comment:    ==================================================================== TOXASSURE SELECT 13 (MW) ==================================================================== Test                             Result       Flag       Units Drug Present not Declared for Prescription Verification   Oxycodone                      1119         UNEXPECTED ng/mg creat   Oxymorphone                    1944         UNEXPECTED ng/mg creat   Noroxycodone                   >3571        UNEXPECTED ng/mg creat   Noroxymorphone                 690          UNEXPECTED ng/mg creat    Sources of oxycodone are scheduled prescription medications.    Oxymorphone, noroxycodone, and noroxymorphone are expected    metabolites of oxycodone. Oxymorphone is also available as a    scheduled prescription medication. ==================================================================== Test                      Result  Flag   Units      Ref Range   Creatinine              280              mg/dL      >=20 ==================================================================== Declared Medications:  The flagging and interpretation on this report are based on the  following declared medications.  Unexpected results may arise from  inaccuracies in the declared medications.  **Note: The testing scope of this panel does not include following  reported medications:  Adalimumab  Ciprofloxacin (Cipro)  Prednisone ==================================================================== For clinical consultation, please call [phone]. ====================================================================     Outpatient Medications Prior to Visit  Medication Sig Dispense Refill  . HUMIRA PEN 40 MG/0.8ML injection Inject 40 mg as directed  every 14 (fourteen) days.     . nicotine (NICODERM CQ - DOSED IN MG/24 HOURS) 14 mg/24hr patch Place 1 patch (14 mg total) onto the skin daily. 28 patch 0  . oxyCODONE-acetaminophen (PERCOCET) 10-325 MG tablet Take 1 tablet by mouth every 12 (twelve) hours as needed for pain. 60 tablet 0  . oxyCODONE-acetaminophen (PERCOCET) 10-325 MG tablet Take 1 tablet by mouth every 12 (twelve) hours as needed for pain. 60 tablet 0   No facility-administered medications prior to visit.    Lab Results  Component Value Date   WBC 11.1 (H) 10/13/2015   HGB 12.0 (L) 10/13/2015   HCT 34.1 (L) 10/13/2015   PLT 226 10/13/2015   GLUCOSE 114 (H) 10/13/2015   ALT 15 (L) 10/11/2015   AST 21 10/11/2015   NA 133 (L) 10/13/2015   K 4.0 10/13/2015   CL 104 10/13/2015   CREATININE 0.94 10/13/2015   BUN 11 10/13/2015   CO2 26 10/13/2015    --------------------------------------------------------------------------------------------------------------------- No results found.     ---------------------------------------------------------------------------------------------------------------------- Past Medical History:  Diagnosis Date  . Cervical radiculopathy   . Chronic abdominal pain   . Chronic neck pain   . Cold   . Crohn disease ([place])   . GERD (gastroesophageal reflux disease)    no meds-2-3 times weekly  . Hand fracture, left   . Headache(784.0)   . Hypertension    no meds   . IBS (irritable bowel syndrome)   . Shortness of breath    "sometimes laying down; sometimes w/activity" (10/13/2012)    Past Surgical History:  Procedure Laterality Date  . ANTERIOR CERVICAL DECOMP/DISCECTOMY FUSION N/A 02/12/2013   Procedure: ANTERIOR CERVICAL DECOMPRESSION/DISCECTOMY FUSION 1 LEVEL;  Surgeon: Winfield Cunas, MD;  Location: [place] [facility]S;  Service: Neurosurgery;  Laterality: N/A;  C67 anterior cervical decompression with fusion plating and bonegraft  . BOWEL RESECTION     x2  . COLON SURGERY      drains placed to drain cyst  . COLONOSCOPY    . COLONOSCOPY WITH PROPOFOL N/A 11/13/2015   Procedure: COLONOSCOPY WITH PROPOFOL;  Surgeon: Manya Silvas, MD;  Location: [facility] ENDOSCOPY;  Service: Endoscopy;  Laterality: N/A;  . ESOPHAGOGASTRODUODENOSCOPY (EGD) WITH PROPOFOL N/A 11/13/2015   Procedure: ESOPHAGOGASTRODUODENOSCOPY (EGD) WITH PROPOFOL;  Surgeon: Manya Silvas, MD;  Location: [facility] ENDOSCOPY;  Service: Endoscopy;  Laterality: N/A;  . LIPOMA EXCISION Right 01/24/2016   Procedure: EXCISION LIPOMA;  Surgeon: Olean Ree, MD;  Location: [facility] ORS;  Service: General;  Laterality: Right;  right back lipoma excision, patient should be prone  . NEVUS EXCISION     left arm + hand    Family History  Problem Relation Age  of Onset  . Hyperlipidemia Mother   . Cancer Maternal Aunt   . Diabetes Paternal Aunt     Social History  Substance Use Topics  . Smoking status: Current Some Day Smoker    Packs/day: 0.10    Years: 22.00    Types: Cigarettes  . Smokeless tobacco: Never Used     Comment: states is progress of stopping 06/27/16  . Alcohol use No    ---------------------------------------------------------------------------------------------------------------------  BP (!) 146/97   Pulse 94   Temp 98.7 F (37.1 C)   Resp 16   Ht 5' 11"  (1.803 m)   Wt 129 lb (58.5 kg)   SpO2 99%   BMI 17.99 kg/m    BP Readings from Last 3 Encounters:  08/22/16 (!) 146/97  06/27/16 (!) 156/95  03/26/16 134/89     Wt Readings from Last 3 Encounters:  08/22/16 129 lb (58.5 kg)  06/27/16 130 lb (59 kg)  03/26/16 140 lb (63.5 kg)     ----------------------------------------------------------------------------------------------------------------------  ROS Review of Systems  Cardiac: No angina Pulmonary: No shortness of breath GI: No constipation persistent abdominal discomfort  Objective:  BP (!) 146/97   Pulse 94   Temp 98.7 F (37.1 C)   Resp 16   Ht 5' 11"  (1.803  m)   Wt 129 lb (58.5 kg)   SpO2 99%   BMI 17.99 kg/m   Physical Exam Heart is regular rate and rhythm without murmur Lungs are clear to auscultation Benjamin Murillo is ambulating well with good strength throughout the lower extremity and minimal low back pain     Assessment & Plan:   Benjamin Murillo was seen today for abdominal pain.  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 -     oxyCODONE-acetaminophen (PERCOCET) 10-325 MG tablet; Take 1 tablet by mouth every 8 (eight) hours as needed for pain.     ----------------------------------------------------------------------------------------------------------------------  Problem List Items Addressed This Visit      Digestive   Crohn disease ([place])     Other   Chronic abdominal pain   Relevant Medications   budesonide (ENTOCORT EC) 3 MG 24 hr capsule   predniSONE (DELTASONE) 10 MG tablet   oxyCODONE-acetaminophen (PERCOCET) 10-325 MG tablet   Chronic pain syndrome (Chronic)    Other Visit Diagnoses    Chronic, continuous use of opioids    -  Primary   Relevant Orders   ToxASSURE Select 13 (MW), Urine   Recurring abdominal pain       Chronic neck pain       Relevant Medications   budesonide (ENTOCORT EC) 3 MG 24 hr capsule   predniSONE (DELTASONE) 10 MG tablet   oxyCODONE-acetaminophen (PERCOCET) 10-325 MG tablet   Facet arthritis of lumbar region ([facility])       Relevant Medications   budesonide (ENTOCORT EC) 3 MG 24 hr capsule   predniSONE (DELTASONE) 10 MG tablet    oxyCODONE-acetaminophen (PERCOCET) 10-325 MG tablet      ----------------------------------------------------------------------------------------------------------------------  1. Crohn's disease without complication, unspecified gastrointestinal tract location [facility]) Benjamin Murillo is questioning whether Benjamin Murillo  may increase transiently his current opioid medications. A stone his history today and our discussions of think it is reasonable to increase to a 75 per month dosing which would enable him to increase to 3 times a day on his worst days of the month. We will do this for 2 months and bring him back to twice a day dosing which has been his baseline. Benjamin Murillo continues to do well with his medications with no diverting or illicit use and we have reviewed the [facility] and it is appropriate.  2. Chronic abdominal pain   3. Chronic pain syndrome   4. Recurring abdominal pain   5. Chronic neck pain Continue with stretching strengthening exercises  6. 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 Chronic, continuous use of opioids  - ToxASSURE Select 13 (MW), Urine    ----------------------------------------------------------------------------------------------------------------------  I am having Benjamin Murillo maintain his HUMIRA PEN, nicotine, budesonide, predniSONE, and oxyCODONE-acetaminophen.   Meds ordered this encounter  Medications  . budesonide (ENTOCORT EC) 3 MG 24 hr capsule    Sig: Take 9 mg by mouth.  . predniSONE (DELTASONE) 10 MG tablet    Sig: Taper: 4m x 7days, 378mx 7 days, 2568m 5 days, 56m52m5 days, 15mg83m days, 10mg 66mdays, 5mg x 36mays, then stop  . DISCONTD: oxyCODONE-acetaminophen (PERCOCET) 10-325 MG tablet    Sig: Take by mouth.  . DISCONTD: oxyCODONE-acetaminophen (PERCOCET) 10-325 MG tablet    Sig: Take 1 tablet by mouth every 8 (eight) hours as needed for pain.    Dispense:  75 tablet    Refill:  0    Do not fill until 08/26/2016  .  oxyCODONE-acetaminophen (PERCOCET) 10-325 MG tablet    Sig: Take 1 tablet by mouth every 8 (eight) hours as needed for pain.    Dispense:  75 tablet    Refill:  0    Do not fill until 09/25/2016       Follow-up: Return in about 2 months (around 10/23/2016) for evaluation, med refill.    James GMolli Barrows02 AM  The [place] practitioner database for opioid medications on this patient has been reviewed by me and my staff   Greater than 50% of the total encounter time was spent in counseling and / or coordination of care.     This dictation was performed utilizing Dragon [employer] analystse excuse any unintentional or mistaken typographical errors as a result.

## 2016-08-29 LAB — TOXASSURE SELECT 13 (MW), URINE

## 2016-09-06 ENCOUNTER — Ambulatory Visit: Admission: RE | Admit: 2016-09-06 | Discharge: 2016-09-06 | Disposition: A | Discharge status: Home / Self Care

## 2016-09-06 DIAGNOSIS — K50019 Crohn's disease of small intestine with unspecified complications: Principal | ICD-10-CM

## 2016-09-06 MED ORDER — PROMETHAZINE 12.5 MG TABLET
ORAL_TABLET | Freq: Three times a day (TID) | ORAL | 3 refills | 0.00000 days | Status: CP | PRN
Start: 2016-09-06 — End: 2017-07-14

## 2016-09-06 MED ORDER — PREDNISONE 5 MG TABLET
ORAL_TABLET | 0 refills | 0 days | Status: CP
Start: 2016-09-06 — End: 2016-09-21

## 2016-09-06 MED ORDER — AZATHIOPRINE 50 MG TABLET
ORAL_TABLET | Freq: Every day | ORAL | 0 refills | 0 days | Status: CP
Start: 2016-09-06 — End: 2016-09-21

## 2016-09-13 DIAGNOSIS — K50818 Crohn's disease of both small and large intestine with other complication: Secondary | ICD-10-CM | POA: Insufficient documentation

## 2016-09-13 DIAGNOSIS — K50111 Crohn's disease of large intestine with rectal bleeding: Principal | ICD-10-CM

## 2016-09-13 MED ORDER — POLYETHYLENE GLYCOL 3350 17 GRAM ORAL POWDER PACKET: g | 0 refills | 0 days

## 2016-09-13 MED ORDER — POLYETHYLENE GLYCOL 3350 17 GRAM ORAL POWDER PACKET
PACK | Freq: Once | ORAL | 0 refills | 0.00000 days | Status: CP
Start: 2016-09-13 — End: 2016-09-13

## 2016-09-13 MED ORDER — ERYTHROMYCIN 250 MG TABLET: tablet | 0 refills | 0 days

## 2016-09-13 MED ORDER — BISACODYL 5 MG TABLET,DELAYED RELEASE: 20 mg | tablet | Freq: Once | 0 refills | 0 days | Status: AC

## 2016-09-13 MED ORDER — ERYTHROMYCIN 250 MG TABLET: 1000 mg | tablet | Freq: Three times a day (TID) | 0 refills | 0 days | Status: AC

## 2016-09-13 MED ORDER — POLYETHYLENE GLYCOL 3350 17 GRAM ORAL POWDER PACKET: 238 g | packet | Freq: Once | 0 refills | 0 days | Status: AC

## 2016-09-13 MED ORDER — ERYTHROMYCIN 250 MG TABLET
ORAL_TABLET | Freq: Three times a day (TID) | ORAL | 0 refills | 0.00000 days | Status: CP
Start: 2016-09-13 — End: 2016-09-13

## 2016-09-13 MED ORDER — BISACODYL 5 MG TABLET,DELAYED RELEASE
ORAL_TABLET | Freq: Once | ORAL | 0 refills | 0.00000 days | Status: CP
Start: 2016-09-13 — End: 2016-09-13

## 2016-09-13 MED ORDER — NEOMYCIN 500 MG TABLET
ORAL_TABLET | Freq: Three times a day (TID) | ORAL | 0 refills | 0.00000 days | Status: CP
Start: 2016-09-13 — End: 2016-09-13

## 2016-09-13 MED ORDER — NEOMYCIN 500 MG TABLET: 1000 mg | tablet | Freq: Three times a day (TID) | 0 refills | 0 days | Status: AC

## 2016-09-16 ENCOUNTER — Inpatient Hospital Stay
Admission: RE | Admit: 2016-09-16 | Discharge: 2016-09-21 | Disposition: A | Discharge status: Home / Self Care | Attending: Anesthesiology | Admitting: Anesthesiology

## 2016-09-16 ENCOUNTER — Inpatient Hospital Stay
Admission: RE | Admit: 2016-09-16 | Discharge: 2016-09-21 | Disposition: A | Discharge status: Home / Self Care | Payer: MEDICARE

## 2016-09-16 ENCOUNTER — Inpatient Hospital Stay
Admission: RE | Admit: 2016-09-16 | Discharge: 2016-09-21 | Disposition: A | Discharge status: Home / Self Care | Payer: MEDICARE | Attending: Anesthesiology | Admitting: Anesthesiology

## 2016-09-16 ENCOUNTER — Inpatient Hospital Stay
Admission: RE | Admit: 2016-09-16 | Discharge: 2016-09-21 | Disposition: A | Discharge status: Home / Self Care | Attending: Surgery | Admitting: Surgery

## 2016-09-16 ENCOUNTER — Inpatient Hospital Stay
Admission: RE | Admit: 2016-09-16 | Discharge: 2016-09-21 | Disposition: A | Discharge status: Home / Self Care | Payer: MEDICARE | Attending: Surgery | Admitting: Surgery

## 2016-09-16 DIAGNOSIS — K50111 Crohn's disease of large intestine with rectal bleeding: Principal | ICD-10-CM

## 2016-09-21 MED ORDER — OXYCODONE 10 MG TABLET
ORAL_TABLET | ORAL | 0 refills | 0.00000 days | Status: CP | PRN
Start: 2016-09-21 — End: 2016-10-18

## 2016-09-21 MED ORDER — PREDNISONE 5 MG TABLET
ORAL_TABLET | ORAL | 0 refills | 0.00000 days | Status: CP
Start: 2016-09-21 — End: 2016-10-18

## 2016-09-21 MED ORDER — TAMSULOSIN 0.4 MG CAPSULE
ORAL_CAPSULE | Freq: Every evening | ORAL | 0 refills | 0.00000 days | Status: CP
Start: 2016-09-21 — End: 2016-10-18

## 2016-09-21 MED ORDER — PREGABALIN 50 MG CAPSULE
ORAL_CAPSULE | Freq: Three times a day (TID) | ORAL | 0 refills | 0.00000 days | Status: CP
Start: 2016-09-21 — End: 2016-10-18

## 2016-09-21 MED ORDER — ONDANSETRON 4 MG DISINTEGRATING TABLET
ORAL_TABLET | Freq: Four times a day (QID) | ORAL | 0 refills | 0.00000 days | Status: CP | PRN
Start: 2016-09-21 — End: 2016-10-18

## 2016-09-25 ENCOUNTER — Inpatient Hospital Stay
Admission: EM | Admit: 2016-09-25 | Discharge: 2016-10-18 | Disposition: A | Discharge status: Home Health | Payer: MEDICARE | Source: Intra-hospital

## 2016-09-25 ENCOUNTER — Inpatient Hospital Stay
Admission: EM | Admit: 2016-09-25 | Discharge: 2016-10-18 | Disposition: A | Discharge status: Home Health | Payer: MEDICARE | Source: Intra-hospital | Attending: Surgery | Admitting: Surgery

## 2016-09-25 DIAGNOSIS — K56609 Unspecified intestinal obstruction, unspecified as to partial versus complete obstruction: Principal | ICD-10-CM

## 2016-09-26 DIAGNOSIS — K56609 Unspecified intestinal obstruction, unspecified as to partial versus complete obstruction: Principal | ICD-10-CM

## 2016-09-27 DIAGNOSIS — K56609 Unspecified intestinal obstruction, unspecified as to partial versus complete obstruction: Principal | ICD-10-CM

## 2016-09-28 DIAGNOSIS — K56609 Unspecified intestinal obstruction, unspecified as to partial versus complete obstruction: Principal | ICD-10-CM

## 2016-09-29 DIAGNOSIS — K56609 Unspecified intestinal obstruction, unspecified as to partial versus complete obstruction: Principal | ICD-10-CM

## 2016-09-30 DIAGNOSIS — K56609 Unspecified intestinal obstruction, unspecified as to partial versus complete obstruction: Principal | ICD-10-CM

## 2016-10-03 DIAGNOSIS — K56609 Unspecified intestinal obstruction, unspecified as to partial versus complete obstruction: Principal | ICD-10-CM

## 2016-10-05 DIAGNOSIS — K56609 Unspecified intestinal obstruction, unspecified as to partial versus complete obstruction: Principal | ICD-10-CM

## 2016-10-09 DIAGNOSIS — K56609 Unspecified intestinal obstruction, unspecified as to partial versus complete obstruction: Principal | ICD-10-CM

## 2016-10-15 DIAGNOSIS — K56609 Unspecified intestinal obstruction, unspecified as to partial versus complete obstruction: Principal | ICD-10-CM

## 2016-10-16 ENCOUNTER — Encounter: Payer: Medicare Other | Admitting: Anesthesiology

## 2016-10-18 MED ORDER — HYDROMORPHONE 2 MG TABLET
ORAL_TABLET | ORAL | 0 refills | 0.00000 days | Status: CP | PRN
Start: 2016-10-18 — End: 2016-10-22

## 2016-10-18 MED ORDER — SODIUM CHLORIDE 0.9 % (FLUSH) INJECTION SYRINGE
Freq: Two times a day (BID) | 1 refills | 0.00000 days | Status: SS
Start: 2016-10-18 — End: 2017-01-08

## 2016-10-18 MED ORDER — METRONIDAZOLE 500 MG TABLET
ORAL_TABLET | Freq: Three times a day (TID) | ORAL | 0 refills | 0.00000 days | Status: CP
Start: 2016-10-18 — End: 2016-10-28

## 2016-10-18 MED ORDER — CALCIUM CARBONATE 200 MG CALCIUM (500 MG) CHEWABLE TABLET
ORAL_TABLET | Freq: Every day | ORAL | 0 refills | 0.00000 days | PRN
Start: 2016-10-18 — End: 2016-11-17

## 2016-10-18 MED ORDER — GABAPENTIN 100 MG CAPSULE
ORAL_CAPSULE | Freq: Three times a day (TID) | ORAL | 0 refills | 0.00000 days | Status: CP
Start: 2016-10-18 — End: 2017-01-09

## 2016-10-18 MED ORDER — MIRTAZAPINE 15 MG TABLET
ORAL_TABLET | Freq: Every evening | ORAL | 1 refills | 0.00000 days | Status: SS
Start: 2016-10-18 — End: 2017-01-08

## 2016-10-18 MED ORDER — AMLODIPINE 10 MG TABLET
ORAL_TABLET | Freq: Every day | ORAL | 2 refills | 0.00000 days | Status: CP
Start: 2016-10-18 — End: 2017-05-09

## 2016-10-18 MED ORDER — LEVOFLOXACIN 500 MG TABLET
ORAL_TABLET | Freq: Every day | ORAL | 0 refills | 0.00000 days | Status: CP
Start: 2016-10-18 — End: 2016-10-28

## 2016-10-18 MED ORDER — DRONABINOL 2.5 MG CAPSULE
ORAL_CAPSULE | Freq: Two times a day (BID) | ORAL | 0 refills | 0.00000 days | Status: CP
Start: 2016-10-18 — End: 2016-10-23

## 2016-10-18 MED ORDER — ONDANSETRON 4 MG DISINTEGRATING TABLET
ORAL_TABLET | Freq: Four times a day (QID) | ORAL | 0 refills | 0.00000 days | Status: CP | PRN
Start: 2016-10-18 — End: 2016-10-23

## 2016-10-18 MED ORDER — FAMOTIDINE 20 MG TABLET
ORAL_TABLET | Freq: Two times a day (BID) | ORAL | 0 refills | 0.00000 days | Status: SS
Start: 2016-10-18 — End: 2017-01-08

## 2016-10-19 MED ORDER — LIDOCAINE 5 % TOPICAL PATCH
MEDICATED_PATCH | Freq: Every day | TRANSDERMAL | 0 refills | 0.00000 days | Status: CP
Start: 2016-10-19 — End: 2016-10-22

## 2016-10-21 ENCOUNTER — Emergency Department
Admission: EM | Admit: 2016-10-21 | Discharge: 2016-10-23 | Disposition: A | Discharge status: Home Health | Payer: MEDICARE | Source: Intra-hospital

## 2016-10-21 ENCOUNTER — Emergency Department
Admission: EM | Admit: 2016-10-21 | Discharge: 2016-10-23 | Disposition: A | Discharge status: Home Health | Source: Intra-hospital

## 2016-10-21 DIAGNOSIS — R5381 Other malaise: Secondary | ICD-10-CM

## 2016-10-21 DIAGNOSIS — R110 Nausea: Secondary | ICD-10-CM

## 2016-10-21 DIAGNOSIS — R1013 Epigastric pain: Principal | ICD-10-CM

## 2016-10-22 DIAGNOSIS — R110 Nausea: Secondary | ICD-10-CM

## 2016-10-22 DIAGNOSIS — R1013 Epigastric pain: Principal | ICD-10-CM

## 2016-10-22 DIAGNOSIS — R5381 Other malaise: Secondary | ICD-10-CM

## 2016-10-23 MED ORDER — DRONABINOL 2.5 MG CAPSULE
ORAL_CAPSULE | Freq: Two times a day (BID) | ORAL | 0 refills | 0 days | Status: CP
Start: 2016-10-23 — End: 2016-10-28

## 2016-10-23 MED ORDER — ONDANSETRON 4 MG DISINTEGRATING TABLET
ORAL_TABLET | Freq: Four times a day (QID) | ORAL | 0 refills | 0 days | Status: CP | PRN
Start: 2016-10-23 — End: 2016-11-22

## 2016-10-23 MED ORDER — PROMETHAZINE 12.5 MG TABLET
ORAL_TABLET | Freq: Four times a day (QID) | ORAL | 0 refills | 0 days | Status: CP | PRN
Start: 2016-10-23 — End: 2017-05-09

## 2016-10-23 MED ORDER — OXYCODONE 5 MG TABLET
ORAL_TABLET | 0 refills | 0 days | Status: SS
Start: 2016-10-23 — End: 2017-01-08

## 2016-10-24 ENCOUNTER — Telehealth: Payer: Self-pay | Admitting: Anesthesiology

## 2016-10-24 ENCOUNTER — Encounter: Payer: Self-pay | Admitting: Anesthesiology

## 2016-10-24 ENCOUNTER — Ambulatory Visit: Payer: Medicare Other | Attending: Anesthesiology | Admitting: Anesthesiology

## 2016-10-24 VITALS — BP 130/83 | HR 106 | Temp 98.3°F | Resp 16 | Ht 71.0 in | Wt 120.0 lb

## 2016-10-24 DIAGNOSIS — K5090 Crohn's disease, unspecified, without complications: Secondary | ICD-10-CM

## 2016-10-24 DIAGNOSIS — G894 Chronic pain syndrome: Secondary | ICD-10-CM

## 2016-10-24 DIAGNOSIS — Z933 Colostomy status: Secondary | ICD-10-CM | POA: Diagnosis not present

## 2016-10-24 DIAGNOSIS — Z79899 Other long term (current) drug therapy: Secondary | ICD-10-CM | POA: Insufficient documentation

## 2016-10-24 DIAGNOSIS — M545 Low back pain: Secondary | ICD-10-CM | POA: Diagnosis not present

## 2016-10-24 DIAGNOSIS — Z79891 Long term (current) use of opiate analgesic: Secondary | ICD-10-CM | POA: Insufficient documentation

## 2016-10-24 DIAGNOSIS — M1388 Other specified arthritis, other site: Secondary | ICD-10-CM | POA: Insufficient documentation

## 2016-10-24 DIAGNOSIS — M4696 Unspecified inflammatory spondylopathy, lumbar region: Secondary | ICD-10-CM

## 2016-10-24 DIAGNOSIS — M542 Cervicalgia: Secondary | ICD-10-CM | POA: Insufficient documentation

## 2016-10-24 DIAGNOSIS — F1190 Opioid use, unspecified, uncomplicated: Secondary | ICD-10-CM

## 2016-10-24 DIAGNOSIS — M47816 Spondylosis without myelopathy or radiculopathy, lumbar region: Secondary | ICD-10-CM

## 2016-10-24 DIAGNOSIS — R109 Unspecified abdominal pain: Secondary | ICD-10-CM

## 2016-10-24 DIAGNOSIS — G8929 Other chronic pain: Secondary | ICD-10-CM

## 2016-10-24 MED ORDER — OXYCODONE-ACETAMINOPHEN 10-325 MG PO TABS: 1.0000 | ORAL_TABLET | Freq: Three times a day (TID) | ORAL | 0 refills | Status: DC | PRN

## 2016-10-24 NOTE — Progress Notes (Signed)
Nursing Pain Medication Assessment:  Safety precautions to be maintained throughout the outpatient stay will include: orient to surroundings, keep bed in low position, maintain call bell within reach at all times, provide assistance with transfer out of bed and ambulation.  Medication Inspection Compliance: Pill count conducted under aseptic conditions, in front of the patient. Neither the pills nor the bottle was removed from the patient's sight at any time. Once count was completed pills were immediately returned to the patient in their original bottle.  Medication: Oxycodone/APAP Pill/Patch Count: 21 of 75 pills remain Pill/Patch Appearance: Markings consistent with prescribed medication Bottle Appearance: Standard pharmacy container. Clearly labeled. Filled Date: 08/29/ 2018 Last Medication intake:  Today   Discharged from hospital yesterday after bowel resection from Crohns complications. Medication list is not accurate, does not know what the new meds are.

## 2016-10-24 NOTE — Telephone Encounter (Signed)
Patient lvmail stating he was just released from [place] 2 day stay. He was given script for additional pain medicine.   Wells Guiles from [place] lvmail stating patient brought script from [facility] for Oxycodone 64m quanity #60. Patient did ask her to call clinic to notify [place] She did fill script for patient. If any questions call her at pharmacy.

## 2016-10-24 NOTE — Progress Notes (Signed)
Subjective:  Patient ID: Benjamin Murillo, male    DOB: 1970/12/24  Age: 46 y.o. MRN: [id number]  CC: Abdominal Pain   Procedure: None   HPI Benjamin Murillo presents for reevaluation. He was last seen 2 months ago. Unfortunately Benjamin Murillo has had a difficult time with his Crohn's disease. He's had a severe recent exacerbation and currently has a diverting colostomy tube in place. He continues to have a chronic aching gnawing abdominal pain similar to what he is previously experienced but with a dramatic exacerbation. He's taking his medications as prescribed including the oxycodone 10 mg tablets. He denies any diverting or illicit use and we have reviewed his narcotic assessment sheet and he continues to derive good functional improvement and lifestyle improvement with these medications. His neck pain and low back pain have also been stable in nature. He is currently under the care of his gastroenterologist.  Outpatient Medications Prior to Visit  Medication Sig Dispense Refill  . predniSONE (DELTASONE) 10 MG tablet Taper: 21m x 7days, 335mx 7 days, 2518m 5 days, 69m63m5 days, 15mg55m days, 10mg 50mdays, 5mg x 2mays, then stop    . oxyCODONE-acetaminophen (PERCOCET) 10-325 MG tablet Take 1 tablet by mouth every 8 (eight) hours as needed for pain. 75 tablet 0  . budesonide (ENTOCORT EC) 3 MG 24 hr capsule Take 9 mg by mouth.    . HUMIRMarland Kitchen PEN 40 MG/0.8ML injection Inject 40 mg as directed every 14 (fourteen) days.     . nicotine (NICODERM CQ - DOSED IN MG/24 HOURS) 14 mg/24hr patch Place 1 patch (14 mg total) onto the skin daily. (Patient not taking: Reported on 10/24/2016) 28 patch 0   No facility-administered medications prior to visit.     Review of Systems C&S: No sedation or confusion Cardiac: No angina or palpitations GI: As above with chronic abdominal pain to patient  Objective:  BP 130/83   Pulse (!) 106   Temp 98.3 F (36.8 C) (Oral)   Resp 16   Ht 5' 11"  (1.803 m)    Wt 120 lb (54.4 kg)   SpO2 100%   BMI 16.74 kg/m    BP Readings from Last 3 Encounters:  10/24/16 130/83  08/22/16 (!) 146/97  06/27/16 (!) 156/95     Wt Readings from Last 3 Encounters:  10/24/16 120 lb (54.4 kg)  08/22/16 129 lb (58.5 kg)  06/27/16 130 lb (59 kg)     Physical Exam Pt is alert and oriented PERRL EOMI HEART IS RRR no murmur or rub LCTA no wheezing or rhales GI: He has a percutaneous colostomy tube in place MUSCULOSKELETALSome paraspinous muscle tenderness in the lumbar region he is walking with a mildly antalgic gait  Labs  No results found for: HGBA1C Lab Results  Component Value Date   CREATININE 0.94 10/13/2015    -------------------------------------------------------------------------------------------------------------------- Lab Results  Component Value Date   WBC 11.1 (H) 10/13/2015   HGB 12.0 (L) 10/13/2015   HCT 34.1 (L) 10/13/2015   PLT 226 10/13/2015   GLUCOSE 114 (H) 10/13/2015   ALT 15 (L) 10/11/2015   AST 21 10/11/2015   NA 133 (L) 10/13/2015   K 4.0 10/13/2015   CL 104 10/13/2015   CREATININE 0.94 10/13/2015   BUN 11 10/13/2015   CO2 26 10/13/2015    --------------------------------------------------------------------------------------------------------------------- No results found.   Assessment & Plan:   Benjamin Murillo today for abdominal pain.  Diagnoses and all orders  for this visit:  Crohn's disease without complication, unspecified gastrointestinal tract location [facility])  Chronic abdominal pain  Chronic pain syndrome  Facet arthritis of lumbar region ([place])  Chronic, continuous use of opioids  Chronic neck pain  Other orders -     Discontinue: oxyCODONE-acetaminophen (PERCOCET) 10-325 MG tablet; Take 1 tablet by mouth every 8 (eight) hours as needed for pain. -     oxyCODONE-acetaminophen (PERCOCET) 10-325 MG tablet; Take 1 tablet by mouth every 8 (eight) hours as needed for  pain.        ----------------------------------------------------------------------------------------------------------------------  Problem List Items Addressed This Visit      Digestive   Crohn disease ([place]) - Primary     Other   Chronic abdominal pain   Relevant Medications   OxyCODONE HCl, Abuse Deter, (OXAYDO) 5 MG TABA   oxyCODONE-acetaminophen (PERCOCET) 10-325 MG tablet   Chronic pain syndrome (Chronic)    Other Visit Diagnoses    Facet arthritis of lumbar region ([place])       Relevant Medications   OxyCODONE HCl, Abuse Deter, (OXAYDO) 5 MG TABA   oxyCODONE-acetaminophen (PERCOCET) 10-325 MG tablet   Chronic, continuous use of opioids       Chronic neck pain       Relevant Medications   OxyCODONE HCl, Abuse Deter, (OXAYDO) 5 MG TABA   oxyCODONE-acetaminophen (PERCOCET) 10-325 MG tablet        ----------------------------------------------------------------------------------------------------------------------  1. Crohn's disease without complication, unspecified gastrointestinal tract location [facility]) Continue follow-up with his gastroenterologist for baseline GI care  2. Chronic abdominal pain As above  3. Chronic pain syndrome We will refill his oxycodone tablets for September 28 and October 28. He is to return to clinic in 2 months. We have reviewed the [facility] practitioner database information. Benjamin Murillo did have a prescription of Dilaudid filled by an outside source. This was for an acute exacerbation of his abdominal pain but we have also notified him that he needs to use these sparingly in addition to his current medication regimen. He also needs to alert [place] if an outside source gives him any opioid medications based on her agreement with his narcotic contract. We will continue to follow this on a routine basis. Otherwise, he has been compliant with our existing regimen.  4. Facet arthritis of lumbar region ([place])   5. Chronic, continuous use of  opioids   6. Chronic neck pain     ----------------------------------------------------------------------------------------------------------------------  I am having Benjamin Murillo maintain his HUMIRA PEN, nicotine, budesonide, predniSONE, ondansetron, OxyCODONE HCl (Abuse Deter), and oxyCODONE-acetaminophen.   Meds ordered this encounter  Medications  . ondansetron (ZOFRAN) 4 MG tablet    Sig: Take 4 mg by mouth every 8 (eight) hours as needed for nausea or vomiting.  . OxyCODONE HCl, Abuse Deter, (OXAYDO) 5 MG TABA    Sig: Take 5 mg by mouth every 8 (eight) hours as needed.  Marland Kitchen DISCONTD: oxyCODONE-acetaminophen (PERCOCET) 10-325 MG tablet    Sig: Take 1 tablet by mouth every 8 (eight) hours as needed for pain.    Dispense:  75 tablet    Refill:  0    Do not fill until 10/25/2016  . oxyCODONE-acetaminophen (PERCOCET) 10-325 MG tablet    Sig: Take 1 tablet by mouth every 8 (eight) hours as needed for pain.    Dispense:  75 tablet    Refill:  0    Do not fill until 11/24/2016   Patient's Medications  New Prescriptions   No medications on file  Previous Medications   BUDESONIDE (ENTOCORT EC) 3 MG 24 HR CAPSULE    Take 9 mg by mouth.   HUMIRA PEN 40 MG/0.8ML INJECTION    Inject 40 mg as directed every 14 (fourteen) days.    NICOTINE (NICODERM CQ - DOSED IN MG/24 HOURS) 14 MG/24HR PATCH    Place 1 patch (14 mg total) onto the skin daily.   ONDANSETRON (ZOFRAN) 4 MG TABLET    Take 4 mg by mouth every 8 (eight) hours as needed for nausea or vomiting.   OXYCODONE HCL, ABUSE DETER, (OXAYDO) 5 MG TABA    Take 5 mg by mouth every 8 (eight) hours as needed.   PREDNISONE (DELTASONE) 10 MG TABLET    Taper: 74m x 7days, 331mx 7 days, 2574m 5 days, 47m59m5 days, 15mg55m days, 10mg 13mdays, 5mg x 57mays, then stop  Modified Medications   Modified Medication Previous Medication   OXYCODONE-ACETAMINOPHEN (PERCOCET) 10-325 MG TABLET oxyCODONE-acetaminophen (PERCOCET) 10-325 MG tablet       Take 1 tablet by mouth every 8 (eight) hours as needed for pain.    Take 1 tablet by mouth every 8 (eight) hours as needed for pain.  Discontinued Medications   No medications on file   ----------------------------------------------------------------------------------------------------------------------  Follow-up: Return in about 2 months (around 12/24/2016) for evaluation, med refill.    James GMolli Barrows

## 2016-10-26 MED ORDER — SCOPOLAMINE 1 MG OVER 3 DAYS TRANSDERMAL PATCH
MEDICATED_PATCH | TRANSDERMAL | 0 refills | 0 days | Status: CP
Start: 2016-10-26 — End: 2016-11-25

## 2016-10-29 ENCOUNTER — Ambulatory Visit
Admission: RE | Admit: 2016-10-29 | Discharge: 2016-10-29 | Disposition: A | Discharge status: Home / Self Care | Payer: MEDICARE | Attending: Surgery | Admitting: Surgery

## 2016-10-29 ENCOUNTER — Ambulatory Visit
Admission: RE | Admit: 2016-10-29 | Discharge: 2016-10-29 | Disposition: A | Discharge status: Home / Self Care | Payer: MEDICARE

## 2016-10-29 DIAGNOSIS — R188 Other ascites: Secondary | ICD-10-CM

## 2016-10-29 DIAGNOSIS — K56601 Complete intestinal obstruction, unspecified as to cause: Principal | ICD-10-CM

## 2016-10-29 DIAGNOSIS — K50919 Crohn's disease, unspecified, with unspecified complications: Secondary | ICD-10-CM

## 2016-10-29 DIAGNOSIS — K50111 Crohn's disease of large intestine with rectal bleeding: Principal | ICD-10-CM

## 2016-10-29 MED ORDER — PREDNISONE 5 MG TABLET
ORAL_TABLET | Freq: Every day | ORAL | 0 refills | 0.00000 days | Status: CP
Start: 2016-10-29 — End: 2016-10-29

## 2016-10-29 MED ORDER — PREDNISONE 5 MG TABLET: 5 mg | tablet | Freq: Every day | 0 refills | 0 days | Status: SS

## 2016-10-29 MED ORDER — PREDNISONE 5 MG TABLET: 5 mg | tablet | Freq: Every day | 0 refills | 0 days | Status: AC

## 2016-11-08 ENCOUNTER — Ambulatory Visit: Admission: RE | Admit: 2016-11-08 | Discharge: 2016-11-08 | Disposition: A | Discharge status: Home / Self Care

## 2016-11-08 DIAGNOSIS — Z1159 Encounter for screening for other viral diseases: Secondary | ICD-10-CM

## 2016-11-08 DIAGNOSIS — K50812 Crohn's disease of both small and large intestine with intestinal obstruction: Principal | ICD-10-CM

## 2016-11-15 ENCOUNTER — Ambulatory Visit
Admission: RE | Admit: 2016-11-15 | Discharge: 2016-11-15 | Disposition: A | Discharge status: Home / Self Care | Payer: MEDICARE | Attending: Surgery | Admitting: Surgery

## 2016-11-15 DIAGNOSIS — K50919 Crohn's disease, unspecified, with unspecified complications: Principal | ICD-10-CM

## 2016-11-15 DIAGNOSIS — Z1159 Encounter for screening for other viral diseases: Secondary | ICD-10-CM

## 2016-11-15 DIAGNOSIS — Z79899 Other long term (current) drug therapy: Secondary | ICD-10-CM

## 2016-11-15 MED ORDER — DRONABINOL 5 MG CAPSULE
ORAL_CAPSULE | Freq: Two times a day (BID) | ORAL | 0 refills | 0.00000 days | Status: CP
Start: 2016-11-15 — End: 2016-12-09

## 2016-11-22 ENCOUNTER — Ambulatory Visit
Admission: RE | Admit: 2016-11-22 | Discharge: 2016-11-22 | Disposition: A | Discharge status: Home / Self Care | Payer: MEDICARE

## 2016-11-22 DIAGNOSIS — K50919 Crohn's disease, unspecified, with unspecified complications: Principal | ICD-10-CM

## 2016-11-25 ENCOUNTER — Ambulatory Visit: Admission: RE | Admit: 2016-11-25 | Discharge: 2016-11-25 | Disposition: A | Discharge status: Home / Self Care

## 2016-11-25 DIAGNOSIS — K50111 Crohn's disease of large intestine with rectal bleeding: Principal | ICD-10-CM

## 2016-12-10 MED ORDER — DRONABINOL 5 MG CAPSULE
ORAL_CAPSULE | 0 refills | 0 days | Status: CP
Start: 2016-12-10 — End: 2017-01-29

## 2016-12-11 MED ORDER — CHOLESTYRAMINE (WITH SUGAR) 4 GRAM POWDER FOR SUSP IN A PACKET
Freq: Three times a day (TID) | ORAL | 2 refills | 0 days | Status: SS
Start: 2016-12-11 — End: 2017-12-21

## 2016-12-18 ENCOUNTER — Ambulatory Visit: Payer: Medicare Other | Attending: Anesthesiology | Admitting: Anesthesiology

## 2016-12-18 ENCOUNTER — Encounter: Payer: Self-pay | Admitting: Anesthesiology

## 2016-12-18 ENCOUNTER — Other Ambulatory Visit: Payer: Self-pay

## 2016-12-18 VITALS — BP 143/87 | HR 103 | Temp 97.9°F | Resp 16 | Ht 71.0 in | Wt 130.0 lb

## 2016-12-18 DIAGNOSIS — G8929 Other chronic pain: Secondary | ICD-10-CM

## 2016-12-18 DIAGNOSIS — M549 Dorsalgia, unspecified: Secondary | ICD-10-CM

## 2016-12-18 DIAGNOSIS — M4696 Unspecified inflammatory spondylopathy, lumbar region: Secondary | ICD-10-CM | POA: Diagnosis not present

## 2016-12-18 DIAGNOSIS — G894 Chronic pain syndrome: Secondary | ICD-10-CM

## 2016-12-18 DIAGNOSIS — R109 Unspecified abdominal pain: Secondary | ICD-10-CM

## 2016-12-18 DIAGNOSIS — M545 Low back pain, unspecified: Secondary | ICD-10-CM

## 2016-12-18 DIAGNOSIS — Z79891 Long term (current) use of opiate analgesic: Secondary | ICD-10-CM | POA: Diagnosis not present

## 2016-12-18 DIAGNOSIS — F1190 Opioid use, unspecified, uncomplicated: Secondary | ICD-10-CM | POA: Diagnosis not present

## 2016-12-18 DIAGNOSIS — M542 Cervicalgia: Secondary | ICD-10-CM | POA: Diagnosis not present

## 2016-12-18 DIAGNOSIS — Z79899 Other long term (current) drug therapy: Secondary | ICD-10-CM | POA: Insufficient documentation

## 2016-12-18 DIAGNOSIS — K5090 Crohn's disease, unspecified, without complications: Secondary | ICD-10-CM

## 2016-12-18 DIAGNOSIS — M47816 Spondylosis without myelopathy or radiculopathy, lumbar region: Secondary | ICD-10-CM

## 2016-12-18 MED ORDER — OXYCODONE-ACETAMINOPHEN 10-325 MG PO TABS: 1.0000 | ORAL_TABLET | Freq: Three times a day (TID) | ORAL | 0 refills | Status: DC | PRN

## 2016-12-18 MED ORDER — GABAPENTIN 100 MG PO CAPS: 100.0000 mg | ORAL_CAPSULE | Freq: Every day | ORAL | 2 refills | Status: DC

## 2016-12-18 MED ORDER — NALOXONE HCL 4 MG/0.1ML NA LIQD: NASAL | 2 refills | Status: AC

## 2016-12-18 NOTE — Progress Notes (Signed)
Nursing Pain Medication Assessment:  Safety precautions to be maintained throughout the outpatient stay will include: orient to surroundings, keep bed in low position, maintain call bell within reach at all times, provide assistance with transfer out of bed and ambulation.  Medication Inspection Compliance: Pill count conducted under aseptic conditions, in front of the patient. Neither the pills nor the bottle was removed from the patient's sight at any time. Once count was completed pills were immediately returned to the patient in their original bottle.  Medication: See above Pill/Patch Count: 0 of 75 pills remain Pill/Patch Appearance: Markings consistent with prescribed medication Bottle Appearance: Standard pharmacy container. Clearly labeled. Filled Date: 38 / 28 / 2018 Last Medication intake:  Today

## 2016-12-18 NOTE — Progress Notes (Signed)
Subjective:  Patient ID: Benjamin Murillo, male    DOB: 05/01/1970  Age: 46 y.o. MRN: [id number]  CC: Abdominal Pain and Neck Pain (right)   Procedure: None  HPI Benjamin Murillo presents for reevaluation.  He was last seen a few months ago.  He reports that his low back pain and neck pain have been troublesome but primarily it is his neck pain that is bothering him at this time.  Is getting a lot of pain radiating from the neck into the right shoulder and down the right arm.  He has had previous surgery for cervical decompression in the past and is scheduled to go see his surgeon within the next week.  The pain keeps him up at night it is a shooting stabbing type pain radiating into the shoulder and down the arm.  Otherwise he is in his usual state of health.  He has had better success with his Crohn GI type pain.  He takes his medications for this and these have been working well based on his narcotic assessment sheet.  He continues to derive good functional lifestyle improvement with his medicines.  Outpatient Medications Prior to Visit  Medication Sig Dispense Refill  . amlodipine-atorvastatin (CADUET) 10-10 MG tablet Take 1 tablet by mouth daily.    . budesonide (ENTOCORT EC) 3 MG 24 hr capsule Take 9 mg by mouth.    . dronabinol (MARINOL) 10 MG capsule Take 10 mg by mouth 2 (two) times daily before a meal.    . ondansetron (ZOFRAN) 4 MG tablet Take 4 mg by mouth every 8 (eight) hours as needed for nausea or vomiting.    Marland Kitchen oxyCODONE-acetaminophen (PERCOCET) 10-325 MG tablet Take 1 tablet by mouth every 8 (eight) hours as needed for pain. 75 tablet 0  . amLODipine (NORVASC) 1 mg/mL SUSP oral suspension Take by mouth daily.    Marland Kitchen HUMIRA PEN 40 MG/0.8ML injection Inject 40 mg as directed every 14 (fourteen) days.     . nicotine (NICODERM CQ - DOSED IN MG/24 HOURS) 14 mg/24hr patch Place 1 patch (14 mg total) onto the skin daily. (Patient not taking: Reported on 10/24/2016) 28 patch 0  .  OxyCODONE HCl, Abuse Deter, (OXAYDO) 5 MG TABA Take 5 mg by mouth every 8 (eight) hours as needed.    . predniSONE (DELTASONE) 10 MG tablet Taper: 64m x 7days, 326mx 7 days, 2560m 5 days, 30m5m5 days, 15mg11m days, 10mg 68mdays, 5mg x 29mays, then stop     No facility-administered medications prior to visit.     Review of Systems CNS: No sedation or confusion Cardiac: No angina or palpitations Chronic diffuse abdominal pain of typical nature and intermittent  Objective:  BP (!) 143/87   Pulse (!) 103   Temp 97.9 F (36.6 C)   Resp 16   Ht 5' 11"  (1.803 m)   Wt 130 lb (59 kg)   SpO2 100%   BMI 18.13 kg/m    BP Readings from Last 3 Encounters:  12/18/16 (!) 143/87  10/24/16 130/83  08/22/16 (!) 146/97     Wt Readings from Last 3 Encounters:  12/18/16 130 lb (59 kg)  10/24/16 120 lb (54.4 kg)  08/22/16 129 lb (58.5 kg)     Physical Exam Pt is alert and oriented PERRL EOMI HEART IS RRR no murmur or rub LCTA no wheezing or rhales MUSCULOSKELETAL reveals some paraspinous muscle tenderness in the neck region and  the trapezius.  He is slightly weaker on the right side at 4+/5 to flexion extension at the elbow.  This is as compared to the left side.  Ambulates well with good muscle tone and bulk to the lower extremities  Labs  No results found for: HGBA1C Lab Results  Component Value Date   CREATININE 0.94 10/13/2015    -------------------------------------------------------------------------------------------------------------------- Lab Results  Component Value Date   WBC 11.1 (H) 10/13/2015   HGB 12.0 (L) 10/13/2015   HCT 34.1 (L) 10/13/2015   PLT 226 10/13/2015   GLUCOSE 114 (H) 10/13/2015   ALT 15 (L) 10/11/2015   AST 21 10/11/2015   NA 133 (L) 10/13/2015   K 4.0 10/13/2015   CL 104 10/13/2015   CREATININE 0.94 10/13/2015   BUN 11 10/13/2015   CO2 26 10/13/2015     --------------------------------------------------------------------------------------------------------------------- No results found.   Assessment & Plan:   Benjamin Murillo was seen today for abdominal pain and neck pain.  Diagnoses and all orders for this visit:  Crohn's disease without complication, unspecified gastrointestinal tract location [facility])  Chronic abdominal pain  Chronic pain syndrome  Chronic, continuous use of opioids  Facet arthritis of lumbar region [facility])  Chronic bilateral low back pain without sciatica  Chronic neck and back pain  Cervicalgia  Other orders -     Discontinue: oxyCODONE-acetaminophen (PERCOCET) 10-325 MG tablet; Take 1 tablet by mouth every 8 (eight) hours as needed for pain. -     oxyCODONE-acetaminophen (PERCOCET) 10-325 MG tablet; Take 1 tablet by mouth every 8 (eight) hours as needed for pain. -     gabapentin (NEURONTIN) 100 MG capsule; Take 1 capsule (100 mg total) by mouth at bedtime. -     naloxone (NARCAN) nasal spray 4 mg/0.1 mL; For respiratory depression from opioids        ----------------------------------------------------------------------------------------------------------------------  Problem List Items Addressed This Visit      Unprioritized   Chronic abdominal pain   Relevant Medications   oxyCODONE-acetaminophen (PERCOCET) 10-325 MG tablet   gabapentin (NEURONTIN) 100 MG capsule   Chronic pain syndrome (Chronic)   Crohn disease ([place]) - Primary    Other Visit Diagnoses    Chronic, continuous use of opioids       Facet arthritis of lumbar region ([place])       Relevant Medications   oxyCODONE-acetaminophen (PERCOCET) 10-325 MG tablet   Chronic bilateral low back pain without sciatica       Relevant Medications   oxyCODONE-acetaminophen (PERCOCET) 10-325 MG tablet   Chronic neck and back pain       Relevant Medications   oxyCODONE-acetaminophen (PERCOCET) 10-325 MG tablet   gabapentin (NEURONTIN) 100 MG  capsule   Cervicalgia            ----------------------------------------------------------------------------------------------------------------------  1. Crohn's disease without complication, unspecified gastrointestinal tract location [facility]) Continue follow-up with GI for medical management  2. Chronic abdominal pain Above  3. Chronic pain syndrome We will refill his medication for his oxycodone today.  These films will be for November 27 and December 27.  He is to return to clinic in 2 months for reevaluation.  4. Chronic, continuous use of opioids As above.  We reviewed the [place] or a [place] of practitioner database information and it is appropriat  5. Facet arthritis of lumbar region [facility]) Continue with back stretching strengthening exercises  6. Chronic bilateral low back pain without sciatica   7. Chronic neck and back pain We will have him follow-up with his neurosurgeon  as scheduled presently in the next week or so.  In the meantime I am going to start him on gabapentin 100 mg tablets at bedtime to see if this can help with some of the neuralgic pain he is experiencing.  8. Cervicalgia He is to return to clinic in 2 months for reevaluation    ----------------------------------------------------------------------------------------------------------------------  I am having Benjamin Murillo. Benjamin Murillo start on gabapentin and naloxone. I am also having him maintain his HUMIRA PEN, nicotine, budesonide, predniSONE, ondansetron, OxyCODONE HCl (Abuse Deter), amLODipine, amlodipine-atorvastatin, dronabinol, and oxyCODONE-acetaminophen.   Meds ordered this encounter  Medications  . amLODipine (NORVASC) 1 mg/mL SUSP oral suspension    Sig: Take by mouth daily.  Marland Kitchen amlodipine-atorvastatin (CADUET) 10-10 MG tablet    Sig: Take 1 tablet by mouth daily.  Marland Kitchen dronabinol (MARINOL) 10 MG capsule    Sig: Take 10 mg by mouth 2 (two) times daily before a meal.  . DISCONTD:  oxyCODONE-acetaminophen (PERCOCET) 10-325 MG tablet    Sig: Take 1 tablet by mouth every 8 (eight) hours as needed for pain.    Dispense:  75 tablet    Refill:  0    Do not fill until [id number]  . oxyCODONE-acetaminophen (PERCOCET) 10-325 MG tablet    Sig: Take 1 tablet by mouth every 8 (eight) hours as needed for pain.    Dispense:  75 tablet    Refill:  0    Do not fill until [id number]  . gabapentin (NEURONTIN) 100 MG capsule    Sig: Take 1 capsule (100 mg total) by mouth at bedtime.    Dispense:  30 capsule    Refill:  2  . naloxone (NARCAN) nasal spray 4 mg/0.1 mL    Sig: For respiratory depression from opioids    Dispense:  1 kit    Refill:  2     Medication List        Accurate as of 12/18/16  2:11 PM. Always use your most recent med list.          amLODipine 1 mg/mL Susp oral suspension Commonly known as:  NORVASC   amlodipine-atorvastatin 10-10 MG tablet Commonly known as:  CADUET   budesonide 3 MG 24 hr capsule Commonly known as:  ENTOCORT EC   dronabinol 10 MG capsule Commonly known as:  MARINOL   gabapentin 100 MG capsule Commonly known as:  NEURONTIN Take 1 capsule (100 mg total) by mouth at bedtime.   HUMIRA PEN 40 MG/0.8ML injection Generic drug:  adalimumab   naloxone 4 MG/0.1ML Liqd nasal spray kit Commonly known as:  NARCAN For respiratory depression from opioids   nicotine 14 mg/24hr patch Commonly known as:  NICODERM CQ - dosed in mg/24 hours Place 1 patch (14 mg total) onto the skin daily.   ondansetron 4 MG tablet Commonly known as:  ZOFRAN   OxyCODONE HCl (Abuse Deter) 5 MG Taba Commonly known as:  OXAYDO   oxyCODONE-acetaminophen 10-325 MG tablet Commonly known as:  PERCOCET Take 1 tablet by mouth every 8 (eight) hours as needed for pain.   predniSONE 10 MG tablet Commonly known as:  DELTASONE       Where to Get Your Medications    These medications were sent to [facility] ([address]), [place] - [place] [address] [place] [address]   Phone:  [phone]   gabapentin 100 MG capsule  naloxone 4 MG/0.1ML Liqd nasal spray kit   You can  get these medications from any pharmacy   Bring a paper prescription for each of these medications  oxyCODONE-acetaminophen 10-325 MG tablet    ----------------------------------------------------------------------------------------------------------------------  Follow-up: Return in about 2 months (around 02/17/2017) for evaluation, med refill.    [name], MD

## 2016-12-18 NOTE — Patient Instructions (Signed)
Prescriptions for Narcan and Gabapentin were sent to your pharmacy. You were given 2 prescriptions for Oxycodone today.

## 2017-01-07 ENCOUNTER — Inpatient Hospital Stay
Admission: EM | Admit: 2017-01-07 | Discharge: 2017-01-09 | Disposition: A | Discharge status: Home / Self Care | Payer: MEDICARE | Source: Intra-hospital

## 2017-01-07 ENCOUNTER — Inpatient Hospital Stay
Admission: EM | Admit: 2017-01-07 | Discharge: 2017-01-09 | Disposition: A | Discharge status: Home / Self Care | Source: Intra-hospital | Admitting: Family Medicine

## 2017-01-07 DIAGNOSIS — R1031 Right lower quadrant pain: Principal | ICD-10-CM

## 2017-01-08 DIAGNOSIS — R1031 Right lower quadrant pain: Principal | ICD-10-CM

## 2017-01-09 MED ORDER — NICOTINE (POLACRILEX) 4 MG GUM
BUCCAL | 0 refills | 0.00000 days | Status: CP | PRN
Start: 2017-01-09 — End: 2017-01-10

## 2017-01-09 MED ORDER — LIDOCAINE 5 % TOPICAL PATCH: 1 | patch | Freq: Every day | 1 refills | 0 days | Status: AC

## 2017-01-09 MED ORDER — GABAPENTIN 300 MG CAPSULE
ORAL_CAPSULE | ORAL | 1 refills | 0.00000 days | Status: CP
Start: 2017-01-09 — End: 2017-01-10

## 2017-01-09 MED ORDER — MULTIVITAMIN-IRON 9 MG-FOLIC ACID 400 MCG-CALCIUM AND MINERALS TABLET: 1 | tablet | Freq: Every day | 1 refills | 0 days | Status: AC

## 2017-01-09 MED ORDER — NICOTINE (POLACRILEX) 4 MG BUCCAL LOZENGE
BUCCAL | 0 refills | 0.00000 days | Status: CP | PRN
Start: 2017-01-09 — End: 2017-01-10

## 2017-01-09 MED ORDER — NICOTINE 21 MG/24 HR DAILY TRANSDERMAL PATCH: 1 | patch | Freq: Every day | 1 refills | 0 days | Status: AC

## 2017-01-10 MED ORDER — LIDOCAINE 5 % TOPICAL PATCH
MEDICATED_PATCH | Freq: Every day | TRANSDERMAL | 1 refills | 0.00000 days | Status: CP
Start: 2017-01-10 — End: 2017-02-09

## 2017-01-10 MED ORDER — NICOTINE (POLACRILEX) 4 MG GUM
BUCCAL | 0 refills | 0.00000 days | Status: CP | PRN
Start: 2017-01-10 — End: 2017-02-09

## 2017-01-10 MED ORDER — GABAPENTIN 300 MG CAPSULE
ORAL_CAPSULE | 1 refills | 0.00000 days | Status: SS
Start: 2017-01-10 — End: 2017-12-21

## 2017-01-10 MED ORDER — NICOTINE 21 MG/24 HR DAILY TRANSDERMAL PATCH
MEDICATED_PATCH | Freq: Every day | TRANSDERMAL | 1 refills | 0.00000 days | Status: CP
Start: 2017-01-10 — End: 2017-05-09

## 2017-01-10 MED ORDER — NICOTINE (POLACRILEX) 4 MG BUCCAL LOZENGE
BUCCAL | 0 refills | 0.00000 days | Status: CP | PRN
Start: 2017-01-10 — End: 2017-02-09

## 2017-01-10 MED ORDER — MULTIVITAMIN-IRON 9 MG-FOLIC ACID 400 MCG-CALCIUM AND MINERALS TABLET
ORAL_TABLET | Freq: Every day | ORAL | 1 refills | 0.00000 days | Status: SS
Start: 2017-01-10 — End: 2017-12-21

## 2017-01-17 ENCOUNTER — Ambulatory Visit
Admission: RE | Admit: 2017-01-17 | Discharge: 2017-01-17 | Disposition: A | Discharge status: Home / Self Care | Attending: Student in an Organized Health Care Education/Training Program

## 2017-01-17 DIAGNOSIS — K50919 Crohn's disease, unspecified, with unspecified complications: Principal | ICD-10-CM

## 2017-01-22 IMAGING — CT CT ABD-PELV W/ CM
2 of 5 series · 16 of 46 positions shown, 18 images · IV contrast (Omni 300)
Comparison: 01/03/2014

CLINICAL DATA: Abdominal pain, nausea, vomiting, diarrhea. History
of Crohn disease. Chronic neck pain. Chronic abdominal pain.
Shortness of breath. Cold. Gastroesophageal reflux disease.
Headache.

EXAM:
CT ABDOMEN AND PELVIS WITH CONTRAST
TECHNIQUE: Multidetector CT imaging of the abdomen and pelvis was performed
using the standard protocol following bolus administration of
intravenous contrast.
CONTRAST:  100mL OMNIPAQUE IOHEXOL 300 MG/ML  SOLN

[Series 2: abd/ pelvis 5.0 i30f 1 · axial · 0.70mm/px · z∈[-475,-90]mm · 13 of 87 slices shown, 15 images]
[im 5/87  soft-tissue]
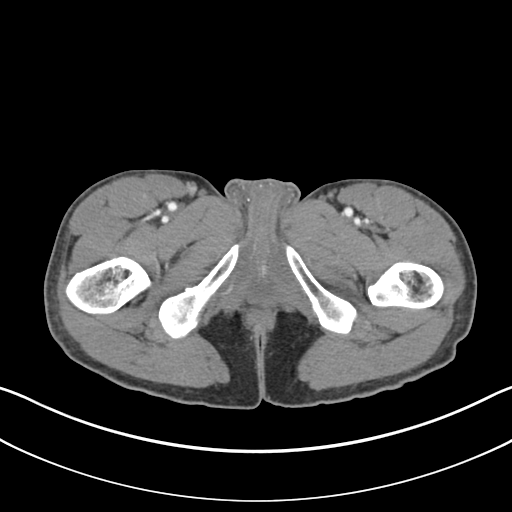
[im 5/87  bone]
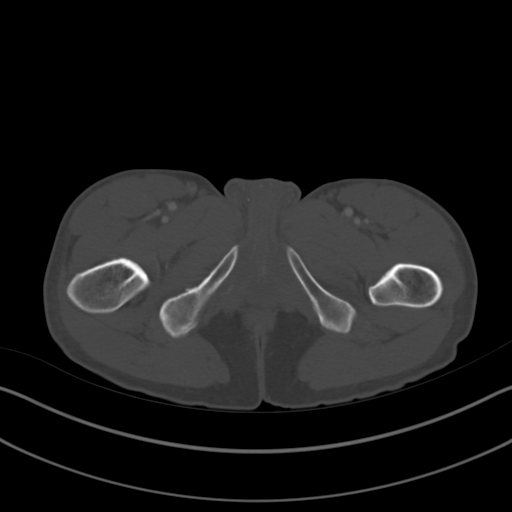
[im 10/87  soft-tissue]
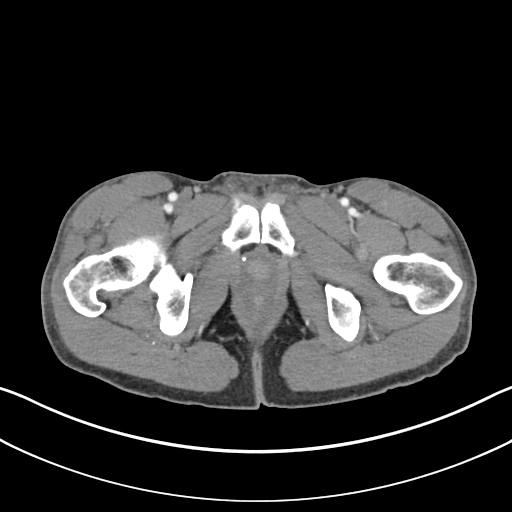
[im 20/87  soft-tissue]
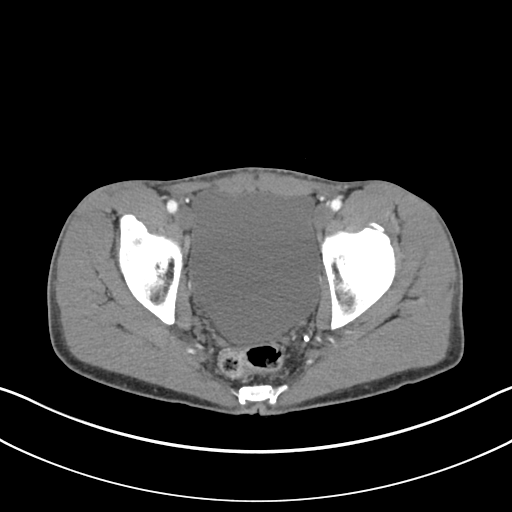
[im 24/87  soft-tissue]
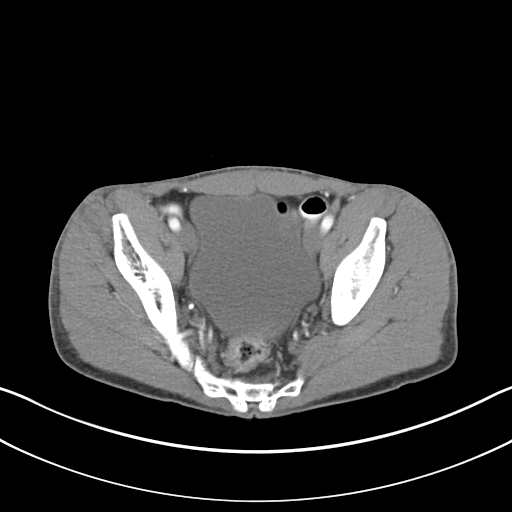
[im 29/87  soft-tissue]
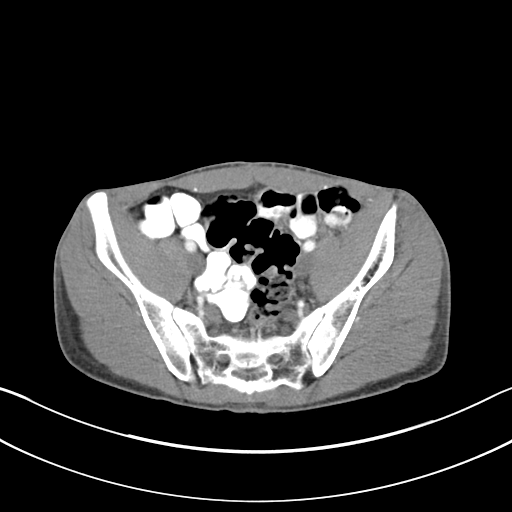
[im 39/87  soft-tissue]
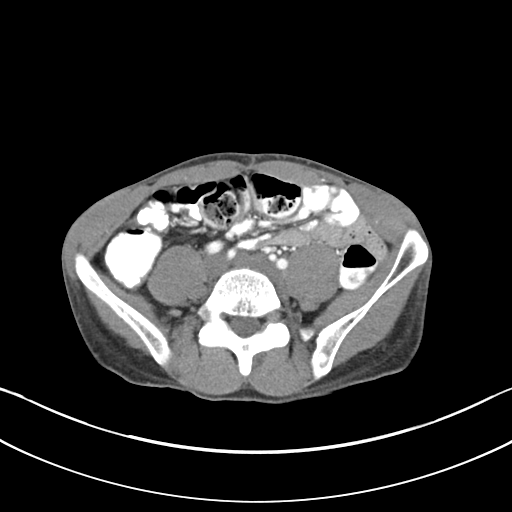
[im 44/87  soft-tissue]
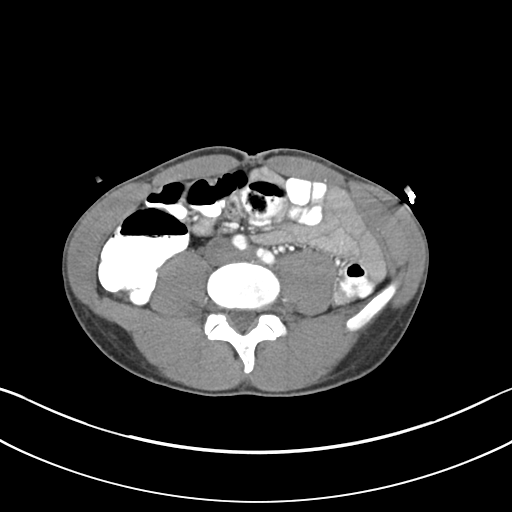
[im 48/87  soft-tissue]
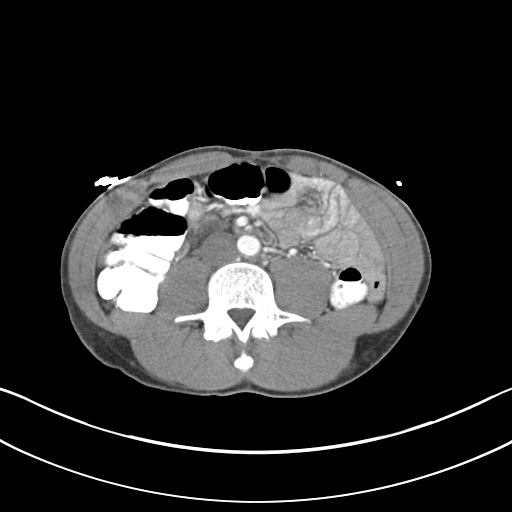
[im 58/87  soft-tissue]
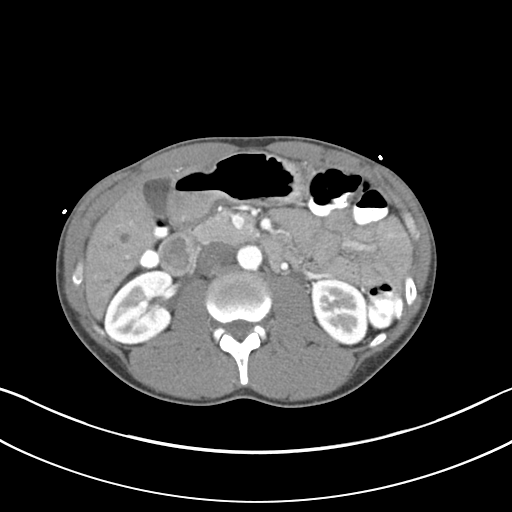
[im 58/87  bone]
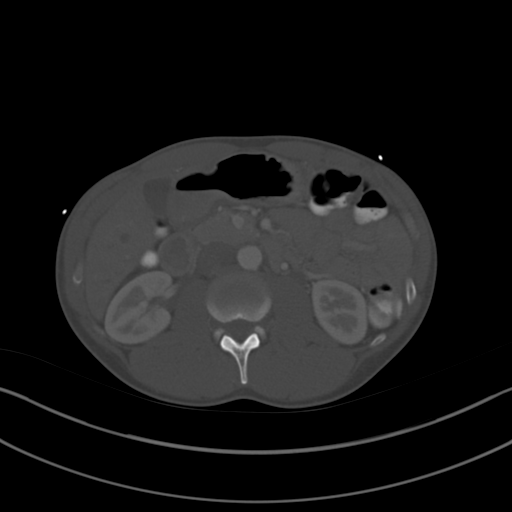
[im 63/87  soft-tissue]
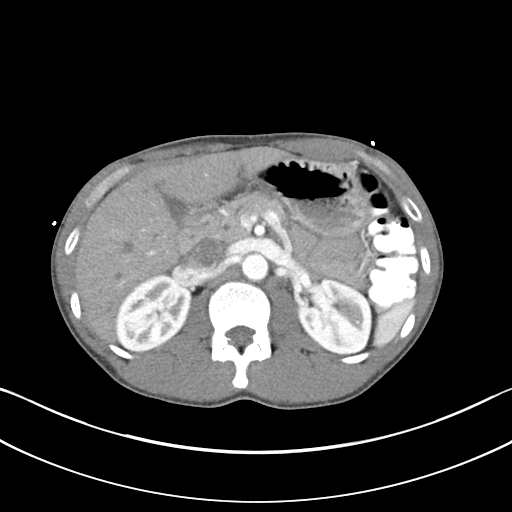
[im 67/87  soft-tissue]
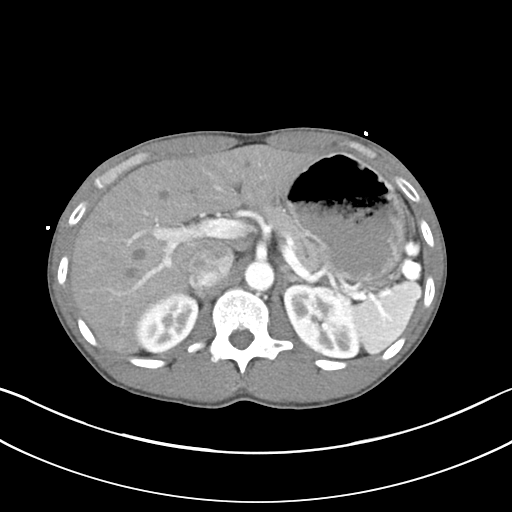
[im 77/87  soft-tissue]
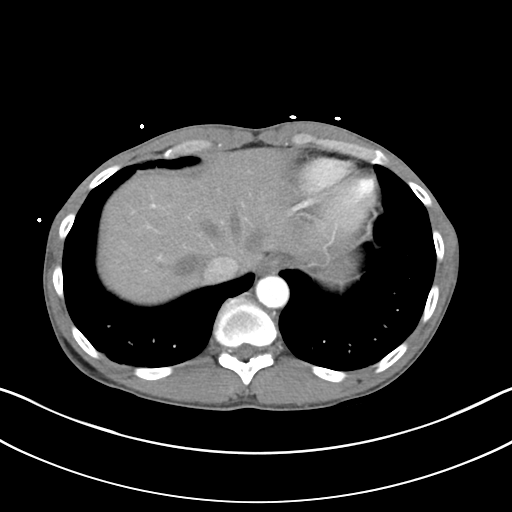
[im 82/87  soft-tissue]
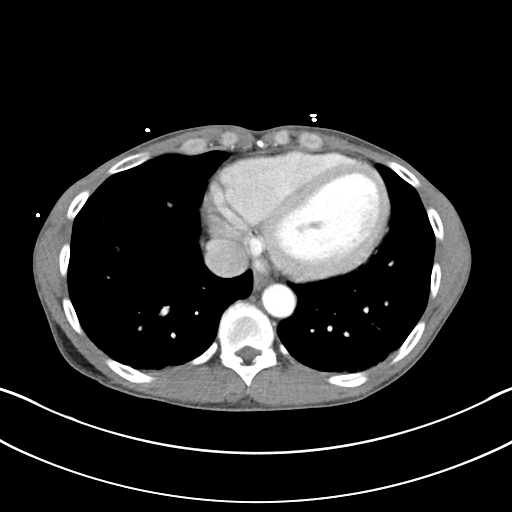

[Series 5: coronals · coronal · 0.59mm/px · 3 of 96 slices shown]
[im 32/96  soft-tissue]
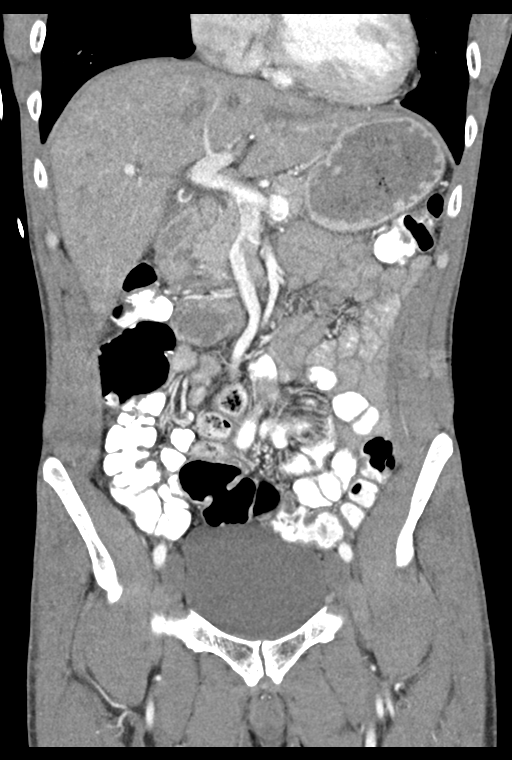
[im 43/96  soft-tissue]
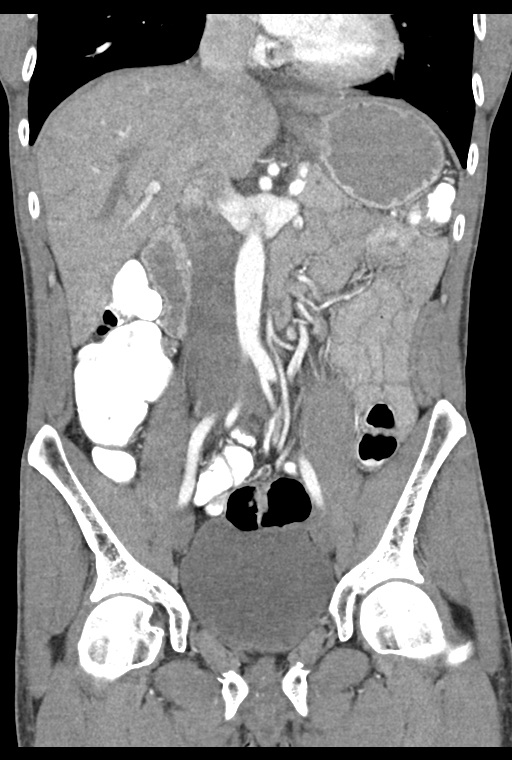
[im 53/96  soft-tissue]
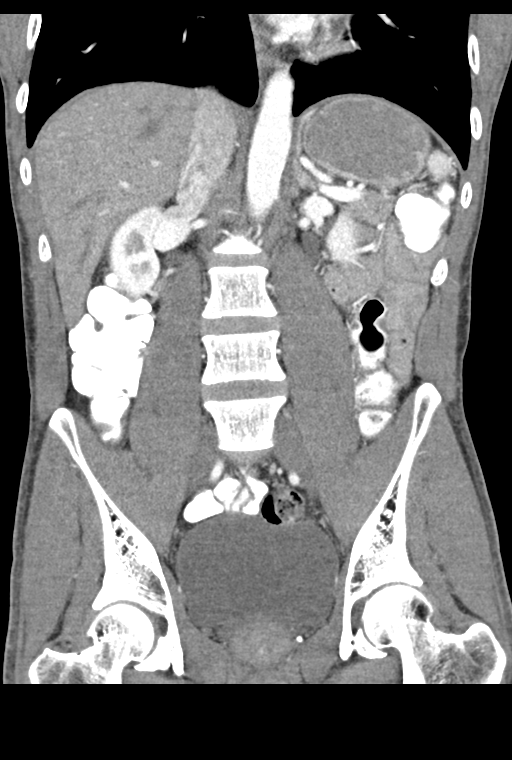

[16 of 46 positions shown; findings below may reference images not displayed]

FINDINGS: Mild dependent changes in the lung bases.

Liver, spleen, gallbladder, pancreas, adrenal glands, kidneys,
abdominal aorta, inferior vena cava, and retroperitoneal lymph nodes
are unremarkable. Stomach and small bowel are incompletely
opacified, limiting evaluation for wall thickening but there is no
evidence of abnormal bowel distention. Contrast material flows
through to the colon without evidence of bowel obstruction. Colon is
well filled with contrast material in demonstrates normal
appearance. No evidence of colonic distention or wall thickening. No
free air or free fluid in the abdomen.

Pelvis: The bladder wall is not thickened. Prostate gland is not
enlarged. No free or loculated pelvic fluid collections. No pelvic
mass or lymphadenopathy. No destructive bone lesions.
IMPRESSION: No CT findings to suggest active Crohn's disease. No bowel
obstruction. No colonic or bowel wall thickening.

## 2017-01-29 MED ORDER — DRONABINOL 5 MG CAPSULE
ORAL_CAPSULE | 0 refills | 0 days | Status: CP
Start: 2017-01-29 — End: 2017-03-11

## 2017-02-18 ENCOUNTER — Other Ambulatory Visit: Payer: Self-pay

## 2017-02-18 ENCOUNTER — Ambulatory Visit: Payer: Medicare Other | Attending: Nurse Practitioner | Admitting: Nurse Practitioner

## 2017-02-18 ENCOUNTER — Encounter: Payer: Self-pay | Admitting: Nurse Practitioner

## 2017-02-18 VITALS — BP 124/85 | HR 75 | Temp 98.0°F | Resp 16 | Ht 71.0 in | Wt 130.0 lb

## 2017-02-18 DIAGNOSIS — Z885 Allergy status to narcotic agent status: Secondary | ICD-10-CM | POA: Diagnosis not present

## 2017-02-18 DIAGNOSIS — G8929 Other chronic pain: Secondary | ICD-10-CM

## 2017-02-18 DIAGNOSIS — I10 Essential (primary) hypertension: Secondary | ICD-10-CM | POA: Insufficient documentation

## 2017-02-18 DIAGNOSIS — M5020 Other cervical disc displacement, unspecified cervical region: Secondary | ICD-10-CM | POA: Diagnosis not present

## 2017-02-18 DIAGNOSIS — R109 Unspecified abdominal pain: Secondary | ICD-10-CM | POA: Diagnosis present

## 2017-02-18 DIAGNOSIS — M25511 Pain in right shoulder: Secondary | ICD-10-CM | POA: Diagnosis present

## 2017-02-18 DIAGNOSIS — K5080 Crohn's disease of both small and large intestine without complications: Secondary | ICD-10-CM | POA: Diagnosis not present

## 2017-02-18 DIAGNOSIS — Z5181 Encounter for therapeutic drug level monitoring: Secondary | ICD-10-CM | POA: Diagnosis present

## 2017-02-18 DIAGNOSIS — K219 Gastro-esophageal reflux disease without esophagitis: Secondary | ICD-10-CM | POA: Diagnosis not present

## 2017-02-18 DIAGNOSIS — Z79899 Other long term (current) drug therapy: Secondary | ICD-10-CM | POA: Diagnosis not present

## 2017-02-18 DIAGNOSIS — Z79891 Long term (current) use of opiate analgesic: Secondary | ICD-10-CM

## 2017-02-18 DIAGNOSIS — M5412 Radiculopathy, cervical region: Secondary | ICD-10-CM | POA: Diagnosis not present

## 2017-02-18 DIAGNOSIS — G894 Chronic pain syndrome: Secondary | ICD-10-CM | POA: Insufficient documentation

## 2017-02-18 MED ORDER — OXYCODONE-ACETAMINOPHEN 10-325 MG PO TABS: 1.0000 | ORAL_TABLET | Freq: Two times a day (BID) | ORAL | 0 refills | Status: DC

## 2017-02-18 MED ORDER — GABAPENTIN 100 MG PO CAPS: 100.0000 mg | ORAL_CAPSULE | Freq: Every day | ORAL | 1 refills | Status: DC

## 2017-02-18 MED ORDER — OXYCODONE-ACETAMINOPHEN 10-325 MG PO TABS: 1.0000 | ORAL_TABLET | Freq: Three times a day (TID) | ORAL | 0 refills | Status: AC | PRN

## 2017-02-18 MED ORDER — OXYCODONE-ACETAMINOPHEN 10-325 MG PO TABS: 1.0000 | ORAL_TABLET | Freq: Three times a day (TID) | ORAL | 0 refills | Status: DC | PRN

## 2017-02-18 NOTE — Progress Notes (Signed)
Nursing Pain Medication Assessment:  Safety precautions to be maintained throughout the outpatient stay will include: orient to surroundings, keep bed in low position, maintain call bell within reach at all times, provide assistance with transfer out of bed and ambulation.  Medication Inspection Compliance: Pill count conducted under aseptic conditions, in front of the patient. Neither the pills nor the bottle was removed from the patient's sight at any time. Once count was completed pills were immediately returned to the patient in their original bottle.  Medication: Oxycodone/APAP Pill/Patch Count: 0 of 75 pills remain Pill/Patch Appearance: Markings consistent with prescribed medication Bottle Appearance: Standard pharmacy container. Clearly labeled. Filled Date: 28 / 27 / 2018 Last Medication intake:  Today

## 2017-02-18 NOTE — Patient Instructions (Addendum)

## 2017-02-18 NOTE — Progress Notes (Signed)
Patient's Name: Benjamin Murillo  MRN: [id number]  Referring Provider: Gennette Pac, FNP  DOB: 09/21/1970  PCP: Gennette Pac, FNP  DOS: 02/18/2017  Note by: Vevelyn Francois NP  Service setting: Ambulatory outpatient  Specialty: Interventional Pain Management  Location: [facility] (AMB) Pain Management Facility    Patient type: Established    Primary Reason(s) for Visit: Encounter for prescription drug management. (Level of risk: moderate)  CC: Abdominal Pain and Shoulder Pain (right)  HPI  Benjamin Murillo is a 47 y.o. year old, male patient, who comes today for a medication management evaluation. He has Crohn's ileitis ([place]); Chronic abdominal pain; Acute abdominal pain; Tobacco abuse; Noncompliance; HNP (herniated nucleus pulposus), cervical; Crohn's colitis ([place]); AP (abdominal pain); Crohn disease ([place]); Diarrhea; Disorder of intestine; Disorder of stomach and duodenum; Drug dependence ([place]); Duodenal ulcer; Status post intestinal bypass or anastomosis; Mixed, or nondependent drug abuse, in remission ([place]); Nausea with vomiting; Personal history of digestive disease; S/P excision of lipoma; Chronic pain syndrome; and Long term current use of opiate analgesic on their problem list. His primarily concern today is the Abdominal Pain and Shoulder Pain (right)  Pain Assessment: Location:   Abdomen Radiating: denies Onset: More than a month ago Duration: Chronic pain Quality: Constant, Stabbing Severity: 9 /10 (self-reported pain score)  Note: Reported level is compatible with observation. Clinically the patient looks like a 1/10 A 1/10 is viewed as "Mild" and described as nagging, annoying, but not interfering with basic activities of daily living (ADL). Benjamin Murillo is able to eat, bathe, get dressed, do toileting (being able to get on and off the toilet and perform personal hygiene functions), transfer (move in and out of bed or a chair without assistance), and maintain continence (able to control bladder  and bowel functions). Physiologic parameters such as blood pressure and heart rate apear wnl. Information on the proper use of the pain scale provided to the patient today. When using our objective Pain Scale, levels between 6 and 10/10 are said to belong in an emergency room, as it progressively worsens from a 6/10, described as severely limiting, requiring emergency care not usually available at an outpatient pain management facility. At a 6/10 level, communication becomes difficult and requires great effort. Assistance to reach the emergency department may be required. Facial flushing and profuse sweating along with potentially dangerous increases in heart rate and blood pressure will be evident. Timing: Constant Modifying factors: medications  Benjamin Murillo was last scheduled for an appointment on Visit date not found for medication management. During today's appointment we reviewed Benjamin Murillo chronic pain status, as well as his outpatient medication regimen. He states that he has been dealing Crohns for years.    The patient  reports that he does not use drugs. His body mass index is 18.13 kg/m.  Further details on both, my assessment(s), as well as the proposed treatment plan, please see below.  Controlled Substance Pharmacotherapy Assessment REMS (Risk Evaluation and Mitigation Strategy)  Analgesic: Oxycodone/acetaminophen 10/325 every 12 hours (oxycodone 20 mg per day) MME/day: 30 mg/day.    Rise Patience  02/18/2017 11:23 AM  Signed Nursing Pain Medication Assessment:  Safety precautions to be maintained throughout the outpatient stay will include: orient to surroundings, keep bed in low position, maintain call bell within reach at all times, provide assistance with transfer out of bed and ambulation.  Medication Inspection Compliance: Pill count conducted under aseptic conditions, in front of the patient. Neither the pills nor the bottle was removed from  the patient's sight at any time.  Once count was completed pills were immediately returned to the patient in their original bottle.  Medication: Oxycodone/APAP Pill/Patch Count: 0 of 75 pills remain Pill/Patch Appearance: Markings consistent with prescribed medication Bottle Appearance: Standard pharmacy container. Clearly labeled. Filled Date: 78 / 27 / 2018 Last Medication intake:  Today   Pharmacokinetics: Liberation and absorption (onset of action): WNL Distribution (time to peak effect): WNL Metabolism and excretion (duration of action): WNL         Pharmacodynamics: Desired effects: Analgesia: Benjamin Murillo reports >50% benefit. Functional ability: Patient reports that medication allows him to accomplish basic ADLs Clinically meaningful improvement in function (CMIF): Sustained CMIF goals met Perceived effectiveness: Described as relatively effective, allowing for increase in activities of daily living (ADL) Undesirable effects: Side-effects or Adverse reactions: None reported Monitoring: [place] PMP: Online review of the past 31-monthperiod conducted. Compliant with practice rules and regulations Last UDS on record: Summary  Date Value Ref Range Status  08/22/2016 FINAL  Final    Comment:    ==================================================================== TOXASSURE SELECT 13 (MW) ==================================================================== Test                             Result       Flag       Units Drug Present and Declared for Prescription Verification   Oxycodone                      >>20802      EXPECTED   ng/mg creat    Sources of oxycodone include scheduled prescription medications. Drug Absent but Declared for Prescription Verification   Oxymorphone                    Not Detected UNEXPECTED ng/mg creat   Noroxycodone                   Not Detected UNEXPECTED ng/mg creat   Noroxymorphone                 Not Detected UNEXPECTED ng/mg creat    A moderate to large amount of oxycodone is  present; the    metabolites oxymorphone, noroxycodone and noroxymorphone are not    present. This is an atypical result. Although patients with    unusual metabolic profiles exist, they are rare. Review of    previous drug screen results or collection of a urine sample    several hours after a WITNESSED dose of the drug may help to    clarify the subject's ability to produce metabolite. ==================================================================== Test                      Result    Flag   Units      Ref Range   Creatinine              78               mg/dL      >=20 ==================================================================== Declared Medications:  The flagging and interpretation on this report are based on the  following declared medications.  Unexpected results may arise from  inaccuracies in the declared medications.  **Note: The testing scope of this panel includes these medications:  Oxycodone (Percocet)  **Note: The testing scope of this panel does not include following  reported medications:  Acetaminophen (Percocet)  Adalimumab (Humira)  Budesonide (  Entocort)  Nicotine (Nicoderm)  Prednisone (Deltasone) ==================================================================== For clinical consultation, please call [phone]. ====================================================================    UDS interpretation: Compliant          Medication Assessment Form: Reviewed. Patient indicates being compliant with therapy Treatment compliance: Compliant Risk Assessment Profile: Aberrant behavior: See prior evaluations. None observed or detected today Comorbid factors increasing risk of overdose: See prior notes. No additional risks detected today Risk of substance use disorder (SUD): Low Opioid Risk Tool - 02/18/17 1118      Family History of Substance Abuse   Alcohol  Negative    Illegal Drugs  Negative    Rx Drugs  Negative      Personal History of  Substance Abuse   Alcohol  Negative    Illegal Drugs  Negative    Rx Drugs  Negative      Age   Age between 48-45 years   Yes      History of Preadolescent Sexual Abuse   History of Preadolescent Sexual Abuse  Negative or Male      Psychological Disease   Psychological Disease  Negative    Depression  Negative      Total Score   Opioid Risk Tool Scoring  1    Opioid Risk Interpretation  Low Risk      ORT Scoring interpretation table:  Score <3 = Low Risk for SUD  Score between 4-7 = Moderate Risk for SUD  Score >8 = High Risk for Opioid Abuse   Risk Mitigation Strategies:  Patient Counseling: Covered Patient-Prescriber Agreement (PPA): Present and active  Notification to other healthcare providers: Done  Pharmacologic Plan: No change in therapy, at this time.             Laboratory Chemistry  Inflammation Markers (CRP: Acute Phase) (ESR: Chronic Phase) Lab Results  Component Value Date   CRP 0.5 10/11/2015   ESRSEDRATE 13 10/12/2015   LATICACIDVEN 0.80 07/05/2013                 Rheumatology Markers No results found for: RF, ANA, Therisa D[place], [facility]              Renal Function Markers Lab Results  Component Value Date   BUN 11 10/13/2015   CREATININE 0.94 10/13/2015   GFRAA >60 10/13/2015   GFRNONAA >60 10/13/2015                 Hepatic Function Markers Lab Results  Component Value Date   AST 21 10/11/2015   ALT 15 (L) 10/11/2015   ALBUMIN 4.8 10/11/2015   ALKPHOS 84 10/11/2015   LIPASE 36 10/11/2015                 Electrolytes Lab Results  Component Value Date   NA 133 (L) 10/13/2015   K 4.0 10/13/2015   CL 104 10/13/2015   CALCIUM 8.3 (L) 10/13/2015   MG 1.7 09/02/2007                 Neuropathy Markers No results found for: VITAMINB12, FOLATE, HGBA1C, HIV               Bone Pathology Markers No results found for: VD25OH, VD125OH2TOT, [id number], [id number], 25OHVITD1, 25OHVITD2, 25OHVITD3, TESTOFREE, TESTOSTERONE                Coagulation Parameters Lab Results  Component Value Date   PLT 226 10/13/2015  Cardiovascular Markers Lab Results  Component Value Date   TROPONINI <0.03 08/07/2015   HGB 12.0 (L) 10/13/2015   HCT 34.1 (L) 10/13/2015                 CA Markers No results found for: CEA, CA125, LABCA2               Note: Lab results reviewed.  Recent Diagnostic Imaging Results  DG Chest 2 View CLINICAL DATA:  Chest pain, shortness of breath, nausea  EXAM: CHEST  2 VIEW  COMPARISON:  07/05/2013  FINDINGS: Cardiomediastinal silhouette is stable. No acute infiltrate or pleural effusion. No pulmonary edema. Bony thorax is unremarkable. Metallic fixation plate noted cervical spine.  IMPRESSION: No active cardiopulmonary disease.  Electronically Signed   By: Lahoma Crocker M.D.   On: 08/07/2015 11:42  Complexity Note: Imaging results reviewed. Results shared with Benjamin Murillo, using Layman's terms.                         Meds   Current Outpatient Medications:  .  amlodipine-atorvastatin (CADUET) 10-10 MG tablet, Take 1 tablet by mouth daily., Disp: , Rfl:  .  dronabinol (MARINOL) 10 MG capsule, Take 10 mg by mouth 2 (two) times daily before a meal., Disp: , Rfl:  .  IRON PO, Take by mouth., Disp: , Rfl:  .  naloxone (NARCAN) nasal spray 4 mg/0.1 mL, For respiratory depression from opioids, Disp: 1 kit, Rfl: 2 .  [START ON 02/22/2017] oxyCODONE-acetaminophen (PERCOCET) 10-325 MG tablet, Take 1 tablet by mouth every 8 (eight) hours as needed for pain., Disp: 75 tablet, Rfl: 0 .  gabapentin (NEURONTIN) 100 MG capsule, Take 1 capsule (100 mg total) by mouth at bedtime., Disp: 30 capsule, Rfl: 1 .  [START ON 03/24/2017] oxyCODONE-acetaminophen (PERCOCET) 10-325 MG tablet, Take 1 tablet by mouth 2 (two) times daily., Disp: 75 tablet, Rfl: 0  ROS  Constitutional: Denies any fever or chills Gastrointestinal: No reported hemesis, hematochezia, vomiting, or acute GI  distress Musculoskeletal: Denies any acute onset joint swelling, redness, loss of ROM, or weakness Neurological: No reported episodes of acute onset apraxia, aphasia, dysarthria, agnosia, amnesia, paralysis, loss of coordination, or loss of consciousness  Allergies  Benjamin Murillo is allergic to vicodin [hydrocodone-acetaminophen]; tramadol; and morphine and related.  [place]  Drug: Benjamin Murillo  reports that he does not use drugs. Alcohol:  reports that he does not drink alcohol. Tobacco:  reports that he has quit smoking. His smoking use included cigarettes. He has a 2.20 pack-year smoking history. he has never used smokeless tobacco. Medical:  has a past medical history of Cervical radiculopathy, Chronic abdominal pain, Chronic neck pain, Cold, Crohn disease ([place]), GERD (gastroesophageal reflux disease), Hand fracture, left, Headache(784.0), Hypertension, IBS (irritable bowel syndrome), and Shortness of breath. Surgical: Benjamin Murillo  has a past surgical history that includes Bowel resection; Nevus excision; Anterior cervical decomp/discectomy fusion (N/A, 02/12/2013); Colonoscopy; Colonoscopy with propofol (N/A, 11/13/2015); Esophagogastroduodenoscopy (egd) with propofol (N/A, 11/13/2015); Colon surgery; and Lipoma excision (Right, 01/24/2016). Family: family history includes Cancer in his maternal aunt; Diabetes in his paternal aunt; Hyperlipidemia in his mother.  Constitutional Exam  General appearance: Well nourished, well developed, and well hydrated. In no apparent acute distress Vitals:   02/18/17 1108  BP: 124/85  Pulse: 75  Resp: 16  Temp: 98 F (36.7 C)  TempSrc: Oral  SpO2: 100%  Weight: 130 lb (59 kg)  Height: _0  (1.803 m)  BMI Assessment: Estimated body mass index is 18.13 kg/m as calculated from the following:   Height as of this encounter: _0  (1.803 m).   Weight as of this encounter: 130 lb (59 kg). Psych/Mental status: Alert, oriented x 3 (person, place, & time)        Eyes: PERLA Respiratory: No evidence of acute respiratory distress  Cervical Spine Area Exam  Skin & Axial Inspection: No masses, redness, edema, swelling, or associated skin lesions Alignment: Symmetrical Functional ROM: Unrestricted ROM      Stability: No instability detected Muscle Tone/Strength: Functionally intact. No obvious neuro-muscular anomalies detected. Sensory (Neurological): Unimpaired Palpation: No palpable anomalies              Upper Extremity (UE) Exam    Side: Right upper extremity  Side: Left upper extremity  Skin & Extremity Inspection: Skin color, temperature, and hair growth are WNL. No peripheral edema or cyanosis. No masses, redness, swelling, asymmetry, or associated skin lesions. No contractures.  Skin & Extremity Inspection: Skin color, temperature, and hair growth are WNL. No peripheral edema or cyanosis. No masses, redness, swelling, asymmetry, or associated skin lesions. No contractures.  Functional ROM: Unrestricted ROM          Functional ROM: Unrestricted ROM          Muscle Tone/Strength: Functionally intact. No obvious neuro-muscular anomalies detected.  Muscle Tone/Strength: Functionally intact. No obvious neuro-muscular anomalies detected.  Sensory (Neurological): Unimpaired          Sensory (Neurological): Unimpaired          Palpation: No palpable anomalies              Palpation: No palpable anomalies              Specialized Test(s): Deferred         Specialized Test(s): Deferred          Thoracic Spine Area Exam  Skin & Axial Inspection: No masses, redness, or swelling Alignment: Symmetrical Functional ROM: Unrestricted ROM Stability: No instability detected Muscle Tone/Strength: Functionally intact. No obvious neuro-muscular anomalies detected. Sensory (Neurological): Unimpaired Muscle strength & Tone: No palpable anomalies  Lumbar Spine Area Exam  Skin & Axial Inspection: No masses, redness, or swelling Alignment: Symmetrical Functional  ROM: Unrestricted ROM      Stability: No instability detected Muscle Tone/Strength: Functionally intact. No obvious neuro-muscular anomalies detected. Sensory (Neurological): Unimpaired Palpation: No palpable anomalies       Provocative Tests: Lumbar Hyperextension and rotation test: evaluation deferred today       Lumbar Lateral bending test: evaluation deferred today       Patrick's Maneuver: evaluation deferred today                    Gait & Posture Assessment  Ambulation: Unassisted Gait: Relatively normal for age and body habitus Posture: WNL   Lower Extremity Exam    Side: Right lower extremity  Side: Left lower extremity  Skin & Extremity Inspection: Skin color, temperature, and hair growth are WNL. No peripheral edema or cyanosis. No masses, redness, swelling, asymmetry, or associated skin lesions. No contractures.  Skin & Extremity Inspection: Skin color, temperature, and hair growth are WNL. No peripheral edema or cyanosis. No masses, redness, swelling, asymmetry, or associated skin lesions. No contractures.  Functional ROM: Unrestricted ROM          Functional ROM: Unrestricted ROM          Muscle Tone/Strength: Functionally intact. No  obvious neuro-muscular anomalies detected.  Muscle Tone/Strength: Functionally intact. No obvious neuro-muscular anomalies detected.  Sensory (Neurological): Unimpaired  Sensory (Neurological): Unimpaired  Palpation: No palpable anomalies  Palpation: No palpable anomalies   Assessment  Primary Diagnosis & Pertinent Problem List: The primary encounter diagnosis was Chronic abdominal pain. Diagnoses of HNP (herniated nucleus pulposus), cervical, Chronic pain syndrome, and Long term current use of opiate analgesic were also pertinent to this visit.  Status Diagnosis  Controlled Controlled Controlled 1. Chronic abdominal pain   2. HNP (herniated nucleus pulposus), cervical   3. Chronic pain syndrome   4. Long term current use of opiate  analgesic     Problems updated and reviewed during this visit: No problems updated. Plan of Care  Pharmacotherapy (Medications Ordered): Meds ordered this encounter  Medications  . gabapentin (NEURONTIN) 100 MG capsule    Sig: Take 1 capsule (100 mg total) by mouth at bedtime.    Dispense:  30 capsule    Refill:  1    Order Specific Question:   Supervising Provider    Answer:   Milinda Pointer [phone]  . oxyCODONE-acetaminophen (PERCOCET) 10-325 MG tablet    Sig: Take 1 tablet by mouth every 8 (eight) hours as needed for pain.    Dispense:  75 tablet    Refill:  0    Do not fill until 02/22/2017    Order Specific Question:   Supervising Provider    Answer:   [name] [[id number]]  . oxyCODONE-acetaminophen (PERCOCET) 10-325 MG tablet    Sig: Take 1 tablet by mouth 2 (two) times daily.    Dispense:  75 tablet    Refill:  0    Do not place this medication, or any other prescription from our practice, on "Automatic Refill". Patient may have prescription filled one day early if pharmacy is closed on scheduled refill date. Do not fill until: 03/24/2017 To last until:04/23/2017    Order Specific Question:   Supervising Provider    Answer:   [name] [[id number]]   New Prescriptions   OXYCODONE-ACETAMINOPHEN (PERCOCET) 10-325 MG TABLET    Take 1 tablet by mouth 2 (two) times daily.   Medications administered today: Grace Bushy. Demary had no medications administered during this visit. Lab-work, procedure(s), and/or referral(s): Orders Placed This Encounter  Procedures  . ToxASSURE Select 13 (MW), Urine   Imaging and/or referral(s): None  Interventional therapies: Planned, scheduled, and/or pending:   Not at this time.   Provider-requested follow-up: Return in about 2 months (around 04/18/2017).  No future appointments. Primary Care Physician: Gennette Pac, FNP Location: [facility] Note by: Vevelyn Francois NP Date: 02/18/2017; Time: 2:01  PM  Pain Score Disclaimer: We use the NRS-11 scale. This is a self-reported, subjective measurement of pain severity with only modest accuracy. It is used primarily to identify changes within a particular patient. It must be understood that outpatient pain scales are significantly less accurate that those used for research, where they can be applied under ideal controlled circumstances with minimal exposure to variables. In reality, the score is likely to be a combination of pain intensity and pain affect, where pain affect describes the degree of emotional arousal or changes in action readiness caused by the sensory experience of pain. Factors such as social and work situation, setting, emotional state, anxiety levels, expectation, and prior pain experience may influence pain perception and show large inter-individual differences that may also be affected by time variables.  Patient  instructions provided during this appointment: Patient Instructions   ____________________________________________________________________________________________  Medication Rules  Applies to: All patients receiving prescriptions (written or electronic).  Pharmacy of record: Pharmacy where electronic prescriptions will be sent. If written prescriptions are taken to a different pharmacy, please inform the nursing staff. The pharmacy listed in the electronic medical record should be the one where you would like electronic prescriptions to be sent.  Prescription refills: Only during scheduled appointments. Applies to both, written and electronic prescriptions.  NOTE: The following applies primarily to controlled substances (Opioid* Pain Medications).   Patient's responsibilities: 1. Pain Pills: Bring all pain pills to every appointment (except for procedure appointments). 2. Pill Bottles: Bring pills in original pharmacy bottle. Always bring newest bottle. Bring bottle, even if empty. 3. Medication refills: You are  responsible for knowing and keeping track of what medications you need refilled. The day before your appointment, write a list of all prescriptions that need to be refilled. Bring that list to your appointment and give it to the admitting nurse. Prescriptions will be written only during appointments. If you forget a medication, it will not be "Called in", "Faxed", or "electronically sent". You will need to get another appointment to get these prescribed. 4. Prescription Accuracy: You are responsible for carefully inspecting your prescriptions before leaving our office. Have the discharge nurse carefully go over each prescription with you, before taking them home. Make sure that your name is accurately spelled, that your address is correct. Check the name and dose of your medication to make sure it is accurate. Check the number of pills, and the written instructions to make sure they are clear and accurate. Make sure that you are given enough medication to last until your next medication refill appointment. 5. Taking Medication: Take medication as prescribed. Never take more pills than instructed. Never take medication more frequently than prescribed. Taking less pills or less frequently is permitted and encouraged, when it comes to controlled substances (written prescriptions).  6. Inform other Doctors: Always inform, all of your healthcare providers, of all the medications you take. 7. Pain Medication from other Providers: You are not allowed to accept any additional pain medication from any other Doctor or Healthcare provider. There are two exceptions to this rule. (see below) In the event that you require additional pain medication, you are responsible for notifying us, as stated below. 8. Medication Agreement: You are responsible for carefully reading and following our Medication Agreement. This must be signed before receiving any prescriptions from our practice. Safely store a copy of your signed  Agreement. Violations to the Agreement will result in no further prescriptions. (Additional copies of our Medication Agreement are available upon request.) 9. Laws, Rules, & Regulations: All patients are expected to follow all Federal and [employer], Rules, [employer]. Ignorance of the Laws does not constitute a valid excuse. The use of any illegal substances is prohibited. 10. Adopted CDC guidelines & recommendations: Target dosing levels will be at or below 60 MME/day. Use of benzodiazepines** is not recommended.  Exceptions: There are only two exceptions to the rule of not receiving pain medications from other Healthcare Providers. 1. Exception #1 (Emergencies): In the event of an emergency (i.e.: accident requiring emergency care), you are allowed to receive additional pain medication. However, you are responsible for: As soon as you are able, call our office [phone], at any time of the day or night, and leave a message stating your name, the date and nature of the  emergency, and the name and dose of the medication prescribed. In the event that your call is answered by a member of our staff, make sure to document and save the date, time, and the name of the person that took your information.  2. Exception #2 (Planned Surgery): In the event that you are scheduled by another doctor or dentist to have any type of surgery or procedure, you are allowed (for a period no longer than 30 days), to receive additional pain medication, for the acute post-op pain. However, in this case, you are responsible for picking up a copy of our "Post-op Pain Management for Surgeons" handout, and giving it to your surgeon or dentist. This document is available at our office, and does not require an appointment to obtain it. Simply go to our office during business hours (Monday-Thursday from 8:00 AM to 4:00 PM) (Friday 8:00 AM to 12:00 Noon) or if you have a scheduled appointment with [place], prior to your surgery,  and ask for it by name. In addition, you will need to provide [place] with your name, name of your surgeon, type of surgery, and date of procedure or surgery.  *Opioid medications include: morphine, codeine, oxycodone, oxymorphone, hydrocodone, hydromorphone, meperidine, tramadol, tapentadol, buprenorphine, fentanyl, methadone. **Benzodiazepine medications include: diazepam (Valium), alprazolam (Xanax), clonazepam (Klonopine), lorazepam (Ativan), clorazepate (Tranxene), chlordiazepoxide (Librium), estazolam (Prosom), oxazepam (Serax), temazepam (Restoril), triazolam (Halcion)  ____________________________________________________________________________________________

## 2017-02-26 LAB — TOXASSURE SELECT 13 (MW), URINE

## 2017-03-10 ENCOUNTER — Ambulatory Visit: Admit: 2017-03-10 | Discharge: 2017-03-10 | Payer: MEDICARE

## 2017-03-10 DIAGNOSIS — M25519 Pain in unspecified shoulder: Principal | ICD-10-CM

## 2017-03-10 DIAGNOSIS — M7541 Impingement syndrome of right shoulder: Secondary | ICD-10-CM

## 2017-03-11 ENCOUNTER — Institutional Professional Consult (permissible substitution): Admit: 2017-03-11 | Discharge: 2017-03-11 | Payer: MEDICARE

## 2017-03-11 DIAGNOSIS — K50919 Crohn's disease, unspecified, with unspecified complications: Principal | ICD-10-CM

## 2017-03-11 MED ORDER — DRONABINOL 5 MG CAPSULE
ORAL_CAPSULE | Freq: Two times a day (BID) | ORAL | 5 refills | 0 days | Status: CP
Start: 2017-03-11 — End: 2017-07-01

## 2017-03-11 MED FILL — DRONABINOL/5MG/CAPS: DRONABINOL/5MG/CAPS | 30 days supply | Qty: 60 | Fill #0

## 2017-03-12 ENCOUNTER — Emergency Department
Admit: 2017-03-12 | Discharge: 2017-03-13 | Disposition: A | Discharge status: Home / Self Care | Payer: MEDICARE | Attending: Emergency Medicine

## 2017-03-12 ENCOUNTER — Ambulatory Visit
Admit: 2017-03-12 | Discharge: 2017-03-13 | Disposition: A | Discharge status: Home / Self Care | Payer: MEDICARE | Attending: Emergency Medicine

## 2017-03-12 DIAGNOSIS — R109 Unspecified abdominal pain: Principal | ICD-10-CM

## 2017-03-14 ENCOUNTER — Ambulatory Visit: Admit: 2017-03-14 | Discharge: 2017-03-15 | Payer: MEDICARE

## 2017-03-14 DIAGNOSIS — A048 Other specified bacterial intestinal infections: Secondary | ICD-10-CM

## 2017-03-14 DIAGNOSIS — K50012 Crohn's disease of small intestine with intestinal obstruction: Principal | ICD-10-CM

## 2017-03-14 DIAGNOSIS — R197 Diarrhea, unspecified: Secondary | ICD-10-CM

## 2017-03-21 ENCOUNTER — Encounter: Admit: 2017-03-21 | Discharge: 2017-03-21 | Payer: MEDICARE

## 2017-03-21 ENCOUNTER — Ambulatory Visit: Admit: 2017-03-21 | Discharge: 2017-03-21 | Payer: MEDICARE

## 2017-03-21 DIAGNOSIS — K5090 Crohn's disease, unspecified, without complications: Principal | ICD-10-CM

## 2017-03-21 MED ORDER — TRAZODONE 50 MG TABLET
ORAL_TABLET | Freq: Every evening | ORAL | 5 refills | 0 days | Status: CP
Start: 2017-03-21 — End: 2017-05-09

## 2017-03-21 MED ORDER — HYOSCYAMINE ER 0.375 MG TABLET,EXTENDED RELEASE,12 HR
ORAL_TABLET | Freq: Two times a day (BID) | ORAL | 11 refills | 0 days | Status: CP
Start: 2017-03-21 — End: 2017-03-26

## 2017-03-26 MED ORDER — HYOSCYAMINE SULFATE 0.125 MG TABLET: 0 mg | tablet | 11 refills | 0 days | Status: AC

## 2017-03-26 MED ORDER — HYOSCYAMINE SULFATE 0.125 MG TABLET
ORAL_TABLET | ORAL | 11 refills | 0.00000 days | Status: SS | PRN
Start: 2017-03-26 — End: 2017-12-21

## 2017-04-09 ENCOUNTER — Ambulatory Visit: Admit: 2017-04-09 | Discharge: 2017-04-10 | Payer: MEDICARE

## 2017-04-09 DIAGNOSIS — M7541 Impingement syndrome of right shoulder: Principal | ICD-10-CM

## 2017-04-15 MED FILL — DRONABINOL/5MG/CAPS: DRONABINOL/5MG/CAPS | 30 days supply | Qty: 60 | Fill #1

## 2017-04-16 ENCOUNTER — Ambulatory Visit: Admit: 2017-04-16 | Discharge: 2017-04-17 | Payer: MEDICARE

## 2017-04-16 DIAGNOSIS — M7541 Impingement syndrome of right shoulder: Principal | ICD-10-CM

## 2017-04-21 ENCOUNTER — Encounter: Payer: Self-pay | Admitting: Anesthesiology

## 2017-04-21 ENCOUNTER — Ambulatory Visit: Payer: Medicare Other | Attending: Anesthesiology | Admitting: Anesthesiology

## 2017-04-21 VITALS — BP 128/83 | HR 91 | Temp 98.3°F | Resp 16 | Ht 71.0 in | Wt 130.0 lb

## 2017-04-21 DIAGNOSIS — M545 Low back pain, unspecified: Secondary | ICD-10-CM

## 2017-04-21 DIAGNOSIS — M4696 Unspecified inflammatory spondylopathy, lumbar region: Secondary | ICD-10-CM | POA: Diagnosis not present

## 2017-04-21 DIAGNOSIS — Z79891 Long term (current) use of opiate analgesic: Secondary | ICD-10-CM | POA: Diagnosis not present

## 2017-04-21 DIAGNOSIS — M25512 Pain in left shoulder: Secondary | ICD-10-CM | POA: Insufficient documentation

## 2017-04-21 DIAGNOSIS — K5090 Crohn's disease, unspecified, without complications: Secondary | ICD-10-CM | POA: Diagnosis not present

## 2017-04-21 DIAGNOSIS — R109 Unspecified abdominal pain: Secondary | ICD-10-CM | POA: Diagnosis present

## 2017-04-21 DIAGNOSIS — Z79899 Other long term (current) drug therapy: Secondary | ICD-10-CM | POA: Diagnosis not present

## 2017-04-21 DIAGNOSIS — F1190 Opioid use, unspecified, uncomplicated: Secondary | ICD-10-CM | POA: Diagnosis not present

## 2017-04-21 DIAGNOSIS — G8929 Other chronic pain: Secondary | ICD-10-CM | POA: Diagnosis not present

## 2017-04-21 DIAGNOSIS — M542 Cervicalgia: Secondary | ICD-10-CM | POA: Insufficient documentation

## 2017-04-21 DIAGNOSIS — G894 Chronic pain syndrome: Secondary | ICD-10-CM

## 2017-04-21 DIAGNOSIS — M47816 Spondylosis without myelopathy or radiculopathy, lumbar region: Secondary | ICD-10-CM

## 2017-04-21 MED ORDER — OXYCODONE-ACETAMINOPHEN 10-325 MG PO TABS: 1.0000 | ORAL_TABLET | Freq: Three times a day (TID) | ORAL | 0 refills | Status: DC

## 2017-04-21 NOTE — Progress Notes (Signed)
Nursing Pain Medication Assessment:  Safety precautions to be maintained throughout the outpatient stay will include: orient to surroundings, keep bed in low position, maintain call bell within reach at all times, provide assistance with transfer out of bed and ambulation.  Medication Inspection Compliance: Pill count conducted under aseptic conditions, in front of the patient. Neither the pills nor the bottle was removed from the patient's sight at any time. Once count was completed pills were immediately returned to the patient in their original bottle.  Medication: Oxycodone/APAP Pill/Patch Count: 0 of 75 pills remain Pill/Patch Appearance: Markings consistent with prescribed medication Bottle Appearance: Standard pharmacy container. Clearly labeled. Filled Date: 02 / 25 / 2019 Last Medication intake:  Today

## 2017-04-22 NOTE — Progress Notes (Signed)
Subjective:  Patient ID: Benjamin Murillo, male    DOB: [birth year]  Age: 47 y.o. MRN: [id number]  CC: Abdominal Pain (crohns disease); Neck Pain (bilateral); and Shoulder Pain (right, torn tendon)   Procedure: None  HPI Benjamin Murillo presents for reevaluation.  He was last seen a few months ago and has been followed by [employer] as well.  He is doing well with his current medication regimen.  He is generally taking his oxycodone 10 mg tablets between 2 and 3 times a day to help with his breakthrough pain for his abdominal cramping, chronic abdominal pain, and cervical neck pain.  The symptoms that he is experienced previously in the neck and arms have been stable however he is having some occasional cramping in the shoulders and has mentioned that he may follow-up with his neurosurgeon regarding continuing management of his upper extremity pain.  In regards to his recent surgery in July 2018 he has been doing better.  He still having some occasional abdominal pain of the same quality characteristic and distribution.  He has been taking Marinol for that in addition to our medications.  He denies any diverting or illicit use with his medications.  Otherwise he is in his usual state of health today.  Based on his narcotic assessment sheet he continues to derive good functional lifestyle improvement with his medications.  Outpatient Medications Prior to Visit  Medication Sig Dispense Refill  . amlodipine-atorvastatin (CADUET) 10-10 MG tablet Take 1 tablet by mouth daily.    Marland Kitchen dronabinol (MARINOL) 10 MG capsule Take 10 mg by mouth 2 (two) times daily before a meal.    . IRON PO Take by mouth.    . naloxone (NARCAN) nasal spray 4 mg/0.1 mL For respiratory depression from opioids 1 kit 2  . oxyCODONE-acetaminophen (PERCOCET) 10-325 MG tablet Take 1 tablet by mouth 2 (two) times daily. 75 tablet 0  . gabapentin (NEURONTIN) 100 MG capsule Take 1 capsule (100 mg total) by mouth at bedtime. 30 capsule  1   No facility-administered medications prior to visit.     Review of Systems CNS: No confusion or sedation Cardiac: No angina or palpitations GI: No abdominal pain or constipation Constitutional: No nausea vomiting fevers or chills  Objective:  BP 128/83 (BP Location: Left Arm, Patient Position: Sitting, Cuff Size: Normal)   Pulse 91   Temp 98.3 F (36.8 C) (Oral)   Resp 16   Ht 5' 11"  (1.803 m)   Wt 130 lb (59 kg)   SpO2 100%   BMI 18.13 kg/m    BP Readings from Last 3 Encounters:  04/21/17 128/83  02/18/17 124/85  12/18/16 (!) 143/87     Wt Readings from Last 3 Encounters:  04/21/17 130 lb (59 kg)  02/18/17 130 lb (59 kg)  12/18/16 130 lb (59 kg)     Physical Exam Pt is alert and oriented PERRL EOMI HEART IS RRR no murmur or rub LCTA no wheezing or rales MUSCULOSKELETAL reveals some paraspinous muscle tenderness in the lumbar region.  He is moving the upper extremities with good strength and function.  Labs  No results found for: HGBA1C Lab Results  Component Value Date   CREATININE 0.94 10/13/2015    -------------------------------------------------------------------------------------------------------------------- Lab Results  Component Value Date   WBC 11.1 (H) 10/13/2015   HGB 12.0 (L) 10/13/2015   HCT 34.1 (L) 10/13/2015   PLT 226 10/13/2015   GLUCOSE 114 (H) 10/13/2015   ALT 15 (L) 10/11/2015  AST 21 10/11/2015   NA 133 (L) 10/13/2015   K 4.0 10/13/2015   CL 104 10/13/2015   CREATININE 0.94 10/13/2015   BUN 11 10/13/2015   CO2 26 10/13/2015    --------------------------------------------------------------------------------------------------------------------- No results found.   Assessment & Plan:   Benjamin Murillo was seen today for abdominal pain, neck pain and shoulder pain.  Diagnoses and all orders for this visit:  Chronic abdominal pain  Chronic pain syndrome  Long term current use of opiate analgesic  Crohn's disease  without complication, unspecified gastrointestinal tract location (HCC)  Chronic, continuous use of opioids  Facet arthritis of lumbar region [facility])  Chronic bilateral low back pain without sciatica  Cervicalgia  Other orders -     Discontinue: oxyCODONE-acetaminophen (PERCOCET) 10-325 MG tablet; Take 1 tablet by mouth 3 (three) times daily. -     Discontinue: oxyCODONE-acetaminophen (PERCOCET) 10-325 MG tablet; Take 1 tablet by mouth 3 (three) times daily. -     oxyCODONE-acetaminophen (PERCOCET) 10-325 MG tablet; Take 1 tablet by mouth 3 (three) times daily.        ----------------------------------------------------------------------------------------------------------------------  Problem List Items Addressed This Visit      Unprioritized   Chronic abdominal pain - Primary   Relevant Medications   oxyCODONE-acetaminophen (PERCOCET) 10-325 MG tablet   Chronic pain syndrome (Chronic)   Crohn disease ([place])   Long term current use of opiate analgesic    Other Visit Diagnoses    Chronic, continuous use of opioids       Facet arthritis of lumbar region ([place])       Relevant Medications   oxyCODONE-acetaminophen (PERCOCET) 10-325 MG tablet   Chronic bilateral low back pain without sciatica       Relevant Medications   oxyCODONE-acetaminophen (PERCOCET) 10-325 MG tablet   Cervicalgia            ----------------------------------------------------------------------------------------------------------------------  1. Chronic abdominal pain He appears stable this point.  I encouraged him to continue follow-up with his GI doctors regarding his Crohn's disease.  We will continue to help with the medication management side of things.  I am going to refill his medications for the next 2 months.  I want him to continue at an average of 2-3 tablets 4 twice daily to 3 times daily as needed dosing.  2. Chronic pain syndrome We have reviewed the [facility] practitioner database  information and it is appropriate.  Refills were given today for March 25 and April 24.  He is currently taking Marinol as a prescribed medication for his condition and he denies any other illicit drug use. 3. Long term current use of opiate analgesic As above  4. Crohn's disease without complication, unspecified gastrointestinal tract location [facility]) As above  5. Chronic, continuous use of opioids   6. Facet arthritis of lumbar region ([place])   7. Chronic bilateral low back pain without sciatica   8. Cervicalgia Continue follow-up with his neurosurgeon and continue his stretching strengthening exercises as previously reviewed    ----------------------------------------------------------------------------------------------------------------------  I am having Grace Bushy. Gruber maintain his amlodipine-atorvastatin, dronabinol, naloxone, IRON PO, gabapentin, and oxyCODONE-acetaminophen.   Meds ordered this encounter  Medications  . DISCONTD: oxyCODONE-acetaminophen (PERCOCET) 10-325 MG tablet    Sig: Take 1 tablet by mouth 3 (three) times daily.    Dispense:  75 tablet    Refill:  0    Do not place this medication, or any other prescription from our practice, on "Automatic Refill". Patient may have prescription filled one day early if  pharmacy is closed on scheduled refill date. Do not fill until: 03/24/2017 To last until:04/23/2017  . DISCONTD: oxyCODONE-acetaminophen (PERCOCET) 10-325 MG tablet    Sig: Take 1 tablet by mouth 3 (three) times daily.    Dispense:  75 tablet    Refill:  0    Do not place this medication, or any other prescription from our practice, on "Automatic Refill". Patient may have prescription filled one day early if pharmacy is closed on scheduled refill date. Do not fill until [id number]  . oxyCODONE-acetaminophen (PERCOCET) 10-325 MG tablet    Sig: Take 1 tablet by mouth 3 (three) times daily.    Dispense:  75 tablet    Refill:  0   Patient's Medications   New Prescriptions   No medications on file  Previous Medications   AMLODIPINE-ATORVASTATIN (CADUET) 10-10 MG TABLET    Take 1 tablet by mouth daily.   DRONABINOL (MARINOL) 10 MG CAPSULE    Take 10 mg by mouth 2 (two) times daily before a meal.   GABAPENTIN (NEURONTIN) 100 MG CAPSULE    Take 1 capsule (100 mg total) by mouth at bedtime.   IRON PO    Take by mouth.   NALOXONE (NARCAN) NASAL SPRAY 4 MG/0.1 ML    For respiratory depression from opioids  Modified Medications   Modified Medication Previous Medication   OXYCODONE-ACETAMINOPHEN (PERCOCET) 10-325 MG TABLET oxyCODONE-acetaminophen (PERCOCET) 10-325 MG tablet      Take 1 tablet by mouth 3 (three) times daily.    Take 1 tablet by mouth 2 (two) times daily.  Discontinued Medications   No medications on file   ----------------------------------------------------------------------------------------------------------------------  Follow-up: Return in about 2 months (around 06/21/2017) for evaluation, med refill.    [name], MD

## 2017-05-06 ENCOUNTER — Institutional Professional Consult (permissible substitution): Admit: 2017-05-06 | Discharge: 2017-05-06 | Payer: MEDICARE

## 2017-05-06 DIAGNOSIS — K50919 Crohn's disease, unspecified, with unspecified complications: Principal | ICD-10-CM

## 2017-05-09 ENCOUNTER — Ambulatory Visit: Admit: 2017-05-09 | Discharge: 2017-05-10 | Payer: MEDICARE

## 2017-05-09 DIAGNOSIS — R110 Nausea: Secondary | ICD-10-CM

## 2017-05-09 DIAGNOSIS — K50812 Crohn's disease of both small and large intestine with intestinal obstruction: Principal | ICD-10-CM

## 2017-05-09 MED ORDER — PROMETHAZINE 12.5 MG TABLET
ORAL_TABLET | Freq: Four times a day (QID) | ORAL | 6 refills | 0 days | Status: CP | PRN
Start: 2017-05-09 — End: 2017-07-14

## 2017-05-09 MED ORDER — MIRTAZAPINE 30 MG TABLET
ORAL_TABLET | Freq: Every evening | ORAL | 11 refills | 0.00000 days | Status: CP
Start: 2017-05-09 — End: 2017-09-12

## 2017-05-09 MED ORDER — CIPROFLOXACIN 500 MG TABLET
ORAL_TABLET | Freq: Two times a day (BID) | ORAL | 0 refills | 0 days | Status: CP
Start: 2017-05-09 — End: 2017-05-19

## 2017-05-30 ENCOUNTER — Emergency Department
Admission: EM | Admit: 2017-05-30 | Discharge: 2017-05-30 | Disposition: A | Discharge status: Home / Self Care | Payer: Medicare Other | Attending: Emergency Medicine | Admitting: Emergency Medicine

## 2017-05-30 ENCOUNTER — Other Ambulatory Visit: Payer: Self-pay

## 2017-05-30 DIAGNOSIS — J029 Acute pharyngitis, unspecified: Secondary | ICD-10-CM | POA: Diagnosis not present

## 2017-05-30 DIAGNOSIS — Z87891 Personal history of nicotine dependence: Secondary | ICD-10-CM | POA: Insufficient documentation

## 2017-05-30 DIAGNOSIS — I10 Essential (primary) hypertension: Secondary | ICD-10-CM | POA: Diagnosis not present

## 2017-05-30 DIAGNOSIS — Z79899 Other long term (current) drug therapy: Secondary | ICD-10-CM | POA: Diagnosis not present

## 2017-05-30 DIAGNOSIS — R070 Pain in throat: Secondary | ICD-10-CM | POA: Diagnosis present

## 2017-05-30 LAB — GROUP A STREP BY PCR: GROUP A STREP BY PCR: NOT DETECTED

## 2017-05-30 MED ORDER — MAGIC MOUTHWASH W/LIDOCAINE: 5.0000 mL | Freq: Four times a day (QID) | ORAL | 0 refills | Status: DC

## 2017-05-30 MED ORDER — DIPHENHYDRAMINE HCL 12.5 MG/5ML PO ELIX
12.5000 mg | ORAL_SOLUTION | Freq: Once | ORAL | Status: AC
  Administered 2017-05-30: 12.5 mg via ORAL
  Filled 2017-05-30: qty 5

## 2017-05-30 MED ORDER — LIDOCAINE VISCOUS 2 % MT SOLN
15.0000 mL | Freq: Once | OROMUCOSAL | Status: AC
  Administered 2017-05-30: 15 mL via OROMUCOSAL
  Filled 2017-05-30: qty 15

## 2017-05-30 NOTE — ED Provider Notes (Signed)
[facility] Emergency Department Provider Note   ____________________________________________   First MD Initiated Contact with Patient 05/30/17 1358     (approximate)  I have reviewed the triage vital signs and the nursing notes.   HISTORY  Chief Complaint Sore Throat    HPI Benjamin Murillo is a 47 y.o. male patient presents with sore throat for 1 week.  Patient state pain increased with swallowing but can tolerate food and fluids with discomfort.  Patient denies other URI signs or symptoms.  Patient rates his pain as a 9/10.  Patient describes his pain is "sore/achy".  No palliative measures for complaint.  Past Medical History:  Diagnosis Date  . Cervical radiculopathy   . Chronic abdominal pain   . Chronic neck pain   . Cold   . Crohn disease ([place])   . GERD (gastroesophageal reflux disease)    no meds-2-3 times weekly  . Hand fracture, left   . Headache(784.0)   . Hypertension    no meds   . IBS (irritable bowel syndrome)   . Shortness of breath    "sometimes laying down; sometimes w/activity" (10/13/2012)    Patient Active Problem List   Diagnosis Date Noted  . Chronic pain syndrome 06/27/2016  . Long term current use of opiate analgesic 06/27/2016  . S/P excision of lipoma 02/07/2016  . AP (abdominal pain)   . Crohn disease ([place])   . Crohn's colitis ([place]) 01/03/2014  . HNP (herniated nucleus pulposus), cervical 02/12/2013  . Tobacco abuse 10/14/2012  . Noncompliance 10/14/2012  . Acute abdominal pain 10/13/2012  . Chronic abdominal pain   . Crohn's ileitis ([place]) 06/06/2012  . Disorder of intestine 10/26/2010  . Disorder of stomach and duodenum 10/26/2010  . 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 Duodenal ulcer 05/22/2010  . Drug dependence ([place]) 03/25/2009  . 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 Personal history of digestive disease 02/05/2008  . Nausea with vomiting  12/31/2004  . Diarrhea 01/18/2004    Past Surgical History:  Procedure Laterality Date  . ANTERIOR CERVICAL DECOMP/DISCECTOMY FUSION N/A 02/12/2013   Procedure: ANTERIOR CERVICAL DECOMPRESSION/DISCECTOMY FUSION 1 LEVEL;  Surgeon: Winfield Cunas, MD;  Location: [facility]URO [facility]S;  Service: Neurosurgery;  Laterality: N/A;  C67 anterior cervical decompression with fusion plating and bonegraft  . BOWEL RESECTION     x2  . COLON SURGERY     drains placed to drain cyst  . COLONOSCOPY    . COLONOSCOPY WITH PROPOFOL N/A 11/13/2015   Procedure: COLONOSCOPY WITH PROPOFOL;  Surgeon: Manya Silvas, MD;  Location[facility] ENDOSCOPY;  Service: Endoscopy;  Laterality: N/A;  . ESOPHAGOGASTRODUODENOSCOPY (EGD) WITH PROPOFOL N/A 11/13/2015   Procedure: ESOPHAGOGASTRODUODENOSCOPY (EGD) WITH PROPOFOL;  Surgeon: Manya Silvas, MD;  Location: [facility] ENDOSCOPY;  Service: Endoscopy;  Laterality: N/A;  . LIPOMA EXCISION Right 01/24/2016   Procedure: EXCISION LIPOMA;  Surgeon: Olean Ree, MD;  Location: [facility] ORS;  Service: General;  Laterality: Right;  right back lipoma excision, patient should be prone  . NEVUS EXCISION     left arm + hand    Prior to Admission medications   Medication Sig Start Date End Date Taking? Authorizing Provider  amlodipine-atorvastatin (CADUET) 10-10 MG tablet Take 1 tablet by mouth daily.    [provider]  dronabinol (MARINOL) 10 MG capsule Take 10 mg by mouth 2 (two) times daily before a meal.    [provider]  gabapentin (  NEURONTIN) 100 MG capsule Take 1 capsule (100 mg total) by mouth at bedtime. 02/18/17 04/19/17  Vevelyn Francois, NP  IRON PO Take by mouth. 01/10/17   [provider]  magic mouthwash w/lidocaine SOLN Take 5 mLs by mouth 4 (four) times daily. 05/30/17   Sable Feil, PA-C  naloxone [facility]) nasal spray 4 mg/0.1 mL For respiratory depression from opioids 12/18/16   [name], MD  oxyCODONE-acetaminophen (PERCOCET) 10-325 MG tablet  Take 1 tablet by mouth 3 (three) times daily. 04/21/17   [name], MD    Allergies Vicodin [hydrocodone-acetaminophen]; Tramadol; and Morphine and related  Family History  Problem Relation Age of Onset  . Hyperlipidemia Mother   . Cancer Maternal Aunt   . Diabetes Paternal Aunt     Social History Social History   Tobacco Use  . Smoking status: Former Smoker    Packs/day: 0.10    Years: 22.00    Pack years: 2.20    Types: Cigarettes  . Smokeless tobacco: Never Used  . Tobacco comment: states is progress of stopping 06/27/16  Substance Use Topics  . Alcohol use: No    Alcohol/week: 0.0 oz  . Drug use: No    Types: Marijuana    Review of Systems Constitutional: No fever/chills Eyes: No visual changes. ENT: Sore throat. Cardiovascular: Denies chest pain. Respiratory: Denies shortness of breath. Gastrointestinal: No abdominal pain.  No nausea, no vomiting.  No diarrhea.  No constipation. Genitourinary: Negative for dysuria. Musculoskeletal: Chronic neck pain. Skin: Negative for rash. Neurological: Negative for headaches, focal weakness or numbness. Endocrine:Hypertension Allergic/Immunilogical: Vicodin, Percocet, morphine, and tramadol. ____________________________________________   PHYSICAL EXAM:  VITAL SIGNS: ED Triage Vitals  Enc Vitals Group     BP 05/30/17 1339 111/73     Pulse Rate 05/30/17 1339 75     Resp 05/30/17 1339 18     Temp 05/30/17 1339 98.5 F (36.9 C)     Temp Source 05/30/17 1339 Oral     SpO2 05/30/17 1339 100 %     Weight 05/30/17 1339 130 lb (59 kg)     Height 05/30/17 1339 5' 11"  (1.803 m)     Head Circumference --      Peak Flow --      Pain Score 05/30/17 1347 9     Pain Loc --      Pain Edu? --      Excl. in [place]? --     Constitutional: Alert and oriented. Well appearing and in no acute distress. Mouth/Throat: Mucous membranes are moist.  Oropharynx erythematous. Neck: No stridor.  Hematological/Lymphatic/Immunilogical:  Right cervical lymphadenopathy. Cardiovascular: Normal rate, regular rhythm. Grossly normal heart sounds.  Good peripheral circulation. Respiratory: Normal respiratory effort.  No retractions. Lungs CTAB. Neurologic:  Normal speech and language. No gross focal neurologic deficits are appreciated. No gait instability. Skin:  Skin is warm, dry and intact. No rash noted. Psychiatric: Mood and affect are normal. Speech and behavior are normal.  ____________________________________________   LABS (all labs ordered are listed, but only abnormal results are displayed)  Labs Reviewed  GROUP A STREP BY PCR   ____________________________________________  EKG   ____________________________________________  RADIOLOGY  ED MD interpretation:    Official radiology report(s): No results found.  ____________________________________________   PROCEDURES  Procedure(s) performed: None  Procedures  Critical Care performed: No  ____________________________________________   INITIAL IMPRESSION / ASSESSMENT AND PLAN / ED COURSE  As part of my medical decision making, I reviewed the  following data within the electronic MEDICAL RECORD NUMBER    Sore throat secondary to viral pharyngitis.  Discussed negative strep test with patient.  Patient given discharge care instruction advised take medication as directed.      ____________________________________________   FINAL CLINICAL IMPRESSION(S) / ED DIAGNOSES  Final diagnoses:  Viral pharyngitis     ED Discharge Orders        Ordered    magic mouthwash w/lidocaine SOLN  4 times daily     05/30/17 1456       Note:  This document was prepared using Dragon voice recognition software and may include unintentional dictation errors.    Sable Feil, PA-C 06/10/17 1135    Arta Silence, MD 06/11/17 [place]

## 2017-05-30 NOTE — ED Triage Notes (Signed)
Pt c/o sore throat for the past week.

## 2017-05-30 NOTE — ED Notes (Signed)
See triage note  Presents with sore throat for a few days   Afebrile on arrival  Able to swallow well

## 2017-06-03 IMAGING — CR DG FINGER MIDDLE 2+V*L*
1 series · 3 of 3 positions shown · non-contrast
Comparison: None available (08/19/1998)

CLINICAL DATA: Foreign body in the distal phalanx of middle finger.

EXAM:
LEFT MIDDLE FINGER 2+V

[Series 1: pa · 0.17mm/px · 3 of 3 slices shown]
[im 1/3]
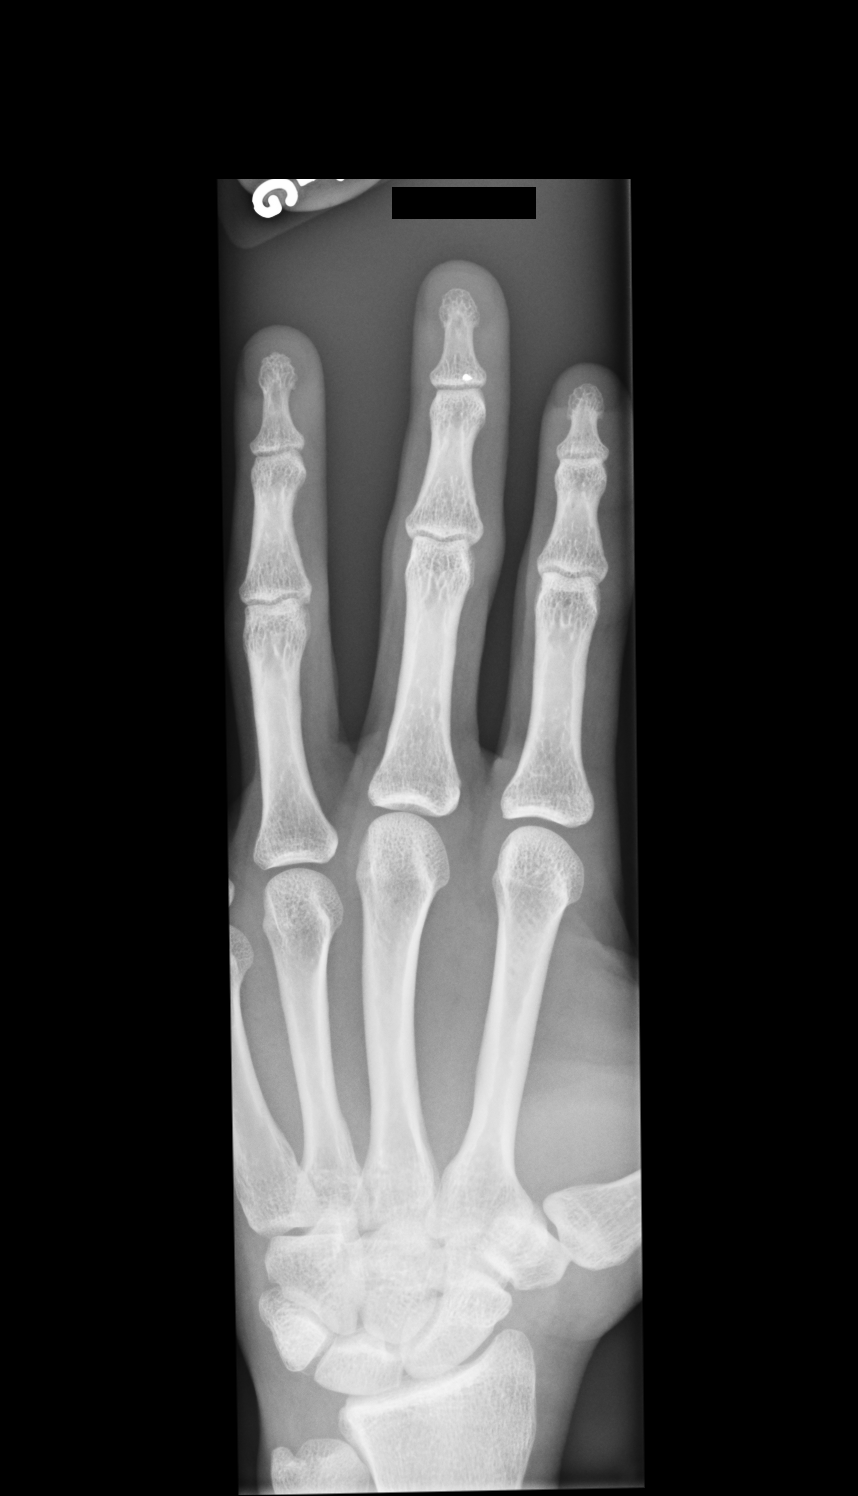
[im 2/3]
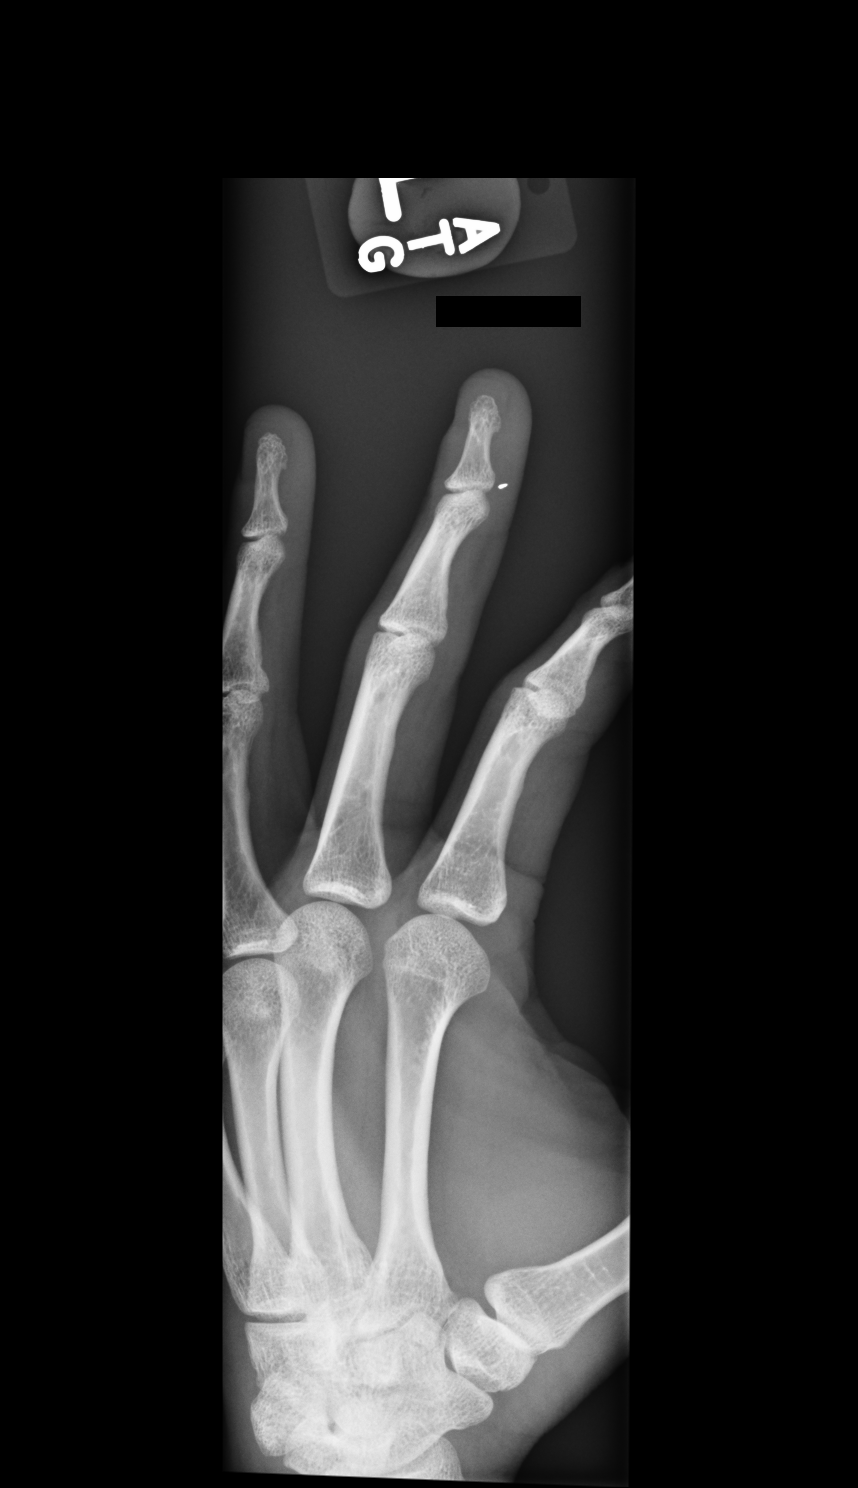
[im 3/3]
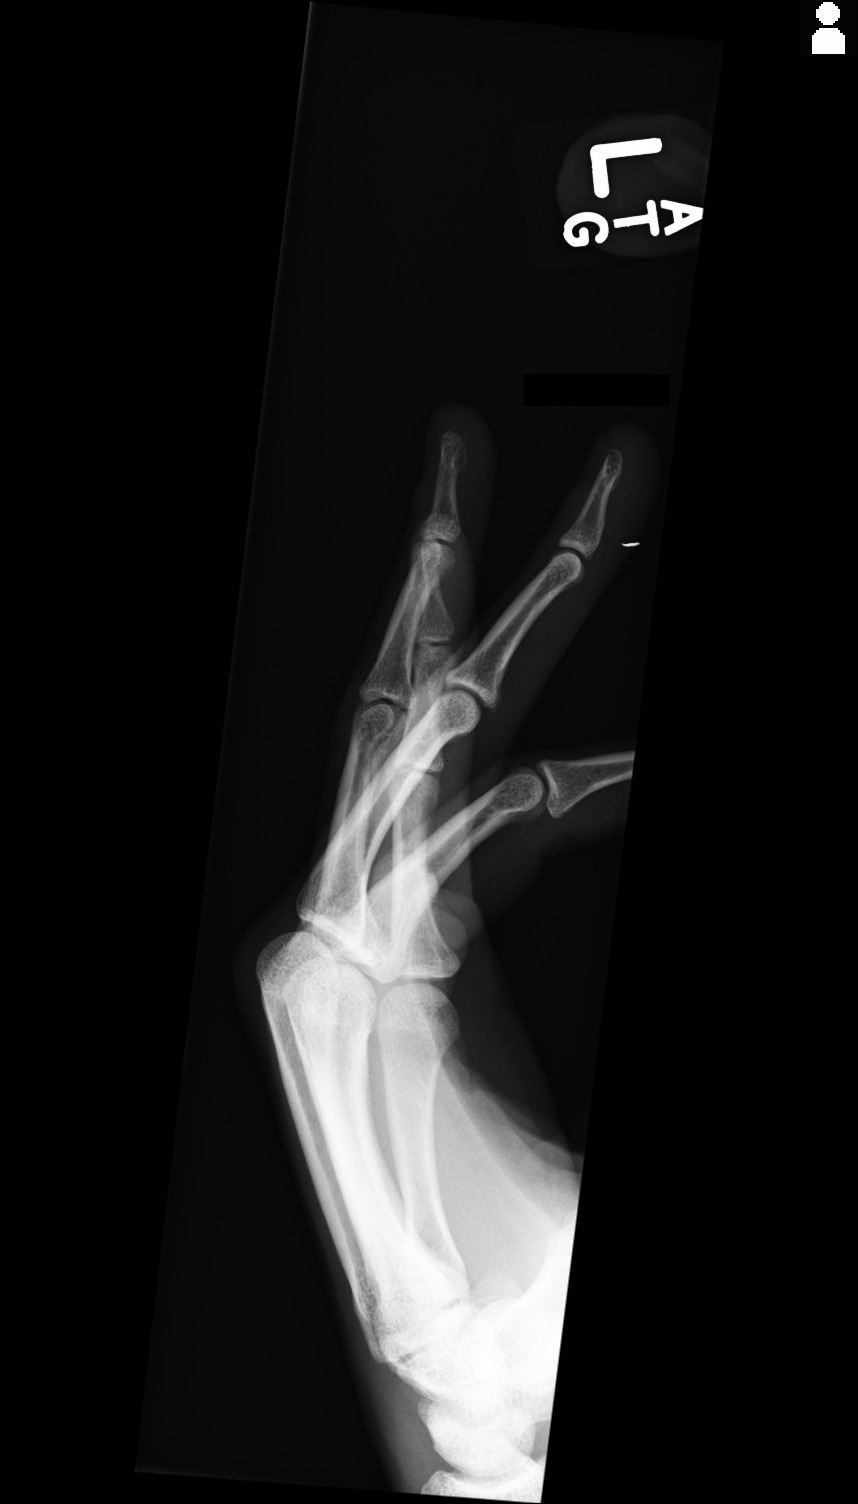

[3 of 3 positions shown; findings below may reference images not displayed]

FINDINGS: 3 mm long metallic foreign body ventral to the third distal phalanx
base. No evidence of fracture or malalignment.
IMPRESSION: 1. 3 mm metallic foreign body ventral to the third distal phalanx.
2. No acute osseous findings.

## 2017-06-19 ENCOUNTER — Ambulatory Visit: Payer: Medicare Other | Attending: Anesthesiology | Admitting: Anesthesiology

## 2017-06-19 ENCOUNTER — Encounter: Payer: Self-pay | Admitting: Anesthesiology

## 2017-06-19 VITALS — BP 120/94 | HR 79 | Temp 98.6°F | Resp 16 | Ht 71.0 in | Wt 130.0 lb

## 2017-06-19 DIAGNOSIS — R109 Unspecified abdominal pain: Secondary | ICD-10-CM | POA: Insufficient documentation

## 2017-06-19 DIAGNOSIS — F1190 Opioid use, unspecified, uncomplicated: Secondary | ICD-10-CM

## 2017-06-19 DIAGNOSIS — M4686 Other specified inflammatory spondylopathies, lumbar region: Secondary | ICD-10-CM | POA: Diagnosis not present

## 2017-06-19 DIAGNOSIS — Z79891 Long term (current) use of opiate analgesic: Secondary | ICD-10-CM | POA: Diagnosis not present

## 2017-06-19 DIAGNOSIS — M47816 Spondylosis without myelopathy or radiculopathy, lumbar region: Secondary | ICD-10-CM | POA: Diagnosis not present

## 2017-06-19 DIAGNOSIS — G894 Chronic pain syndrome: Secondary | ICD-10-CM | POA: Insufficient documentation

## 2017-06-19 DIAGNOSIS — M545 Low back pain: Secondary | ICD-10-CM | POA: Insufficient documentation

## 2017-06-19 DIAGNOSIS — G8929 Other chronic pain: Secondary | ICD-10-CM | POA: Diagnosis not present

## 2017-06-19 DIAGNOSIS — R531 Weakness: Secondary | ICD-10-CM | POA: Diagnosis not present

## 2017-06-19 DIAGNOSIS — K5090 Crohn's disease, unspecified, without complications: Secondary | ICD-10-CM | POA: Insufficient documentation

## 2017-06-19 DIAGNOSIS — Z79899 Other long term (current) drug therapy: Secondary | ICD-10-CM | POA: Insufficient documentation

## 2017-06-19 DIAGNOSIS — M542 Cervicalgia: Secondary | ICD-10-CM | POA: Diagnosis not present

## 2017-06-19 MED ORDER — OXYCODONE-ACETAMINOPHEN 10-325 MG PO TABS: 1.0000 | ORAL_TABLET | Freq: Three times a day (TID) | ORAL | 0 refills | Status: DC

## 2017-06-19 NOTE — Progress Notes (Signed)
Subjective:  Patient ID: Benjamin Murillo, male    DOB: 08-21-1970  Age: 47 y.o. MRN: [id number]  CC: Abdominal Pain (crohn disease ) and Neck Pain (right )   Procedure: None  HPI Benjamin Murillo presents for reevaluation.  He was last seen 2 months ago and is been doing well in regards to his abdominal pain.  He also has some low back pain and has had some persistent neck pain following a previous anterior cervical disc fusion.  This primarily right trapezius and some occasional chronic left upper extremity weakness.  He maintains that he is due for repeat neurosurgical follow-up here within the next few months.  He is taking his medications as prescribed with the oxycodone 10 mg 325 tablets taken these generally 3 times a day.  He uses approximately 75 tablets/month.  He is also taking Marinol as prescribed by his GI doctors.  This is for the chronic abdominal pain.  Otherwise he is in his usual state of health today.  No changes in his upper or lower extremity strength or function are noted at this time area the abdominal pain quality characteristic distribution is also stable in nature.  He is taking his medications as prescribed and denies any diverting or illicit use.  Based on his narcotic assessment sheet he continues to derive functional lifestyle improvement with the medications.  Outpatient Medications Prior to Visit  Medication Sig Dispense Refill  . amlodipine-atorvastatin (CADUET) 10-10 MG tablet Take 1 tablet by mouth daily.    Marland Kitchen dronabinol (MARINOL) 10 MG capsule Take 10 mg by mouth 2 (two) times daily before a meal.    . hyoscyamine (LEVSIN, ANASPAZ) 0.125 MG tablet Take 1 tablet by mouth as needed.    . IRON PO Take by mouth.    . magic mouthwash w/lidocaine SOLN Take 5 mLs by mouth 4 (four) times daily. 100 mL 0  . naloxone (NARCAN) nasal spray 4 mg/0.1 mL For respiratory depression from opioids 1 kit 2  . oxyCODONE-acetaminophen (PERCOCET) 10-325 MG tablet Take 1 tablet by  mouth 3 (three) times daily. 75 tablet 0  . famotidine (PEPCID) 20 MG tablet Take 20 mg by mouth 2 (two) times daily.    Marland Kitchen gabapentin (NEURONTIN) 100 MG capsule Take 1 capsule (100 mg total) by mouth at bedtime. 30 capsule 1   No facility-administered medications prior to visit.     Review of Systems CNS: No confusion or sedation Cardiac: No angina or palpitations GI: No abdominal pain or constipation Constitutional: No nausea vomiting fevers or chills  Objective:  BP (!) 120/94 (BP Location: Left Arm, Patient Position: Sitting, Cuff Size: Normal)   Pulse 79   Temp 98.6 F (37 C) (Oral)   Resp 16   Ht _0  (1.803 m)   Wt 130 lb (59 kg)   SpO2 100%   BMI 18.13 kg/m    BP Readings from Last 3 Encounters:  06/19/17 (!) 120/94  05/30/17 111/73  04/21/17 128/83     Wt Readings from Last 3 Encounters:  06/19/17 130 lb (59 kg)  05/30/17 130 lb (59 kg)  04/21/17 130 lb (59 kg)     Physical Exam Pt is alert and oriented PERRL EOMI HEART IS RRR no murmur or rub LCTA no wheezing or rales MUSCULOSKELETAL reveals some mild paraspinous muscle tenderness in the lumbar region but no overt trigger points.  He does have pain in the right posterior trapezius muscle.  He also has some atrophy of  the left upper extremity forearm musculature.  His grip strength is slightly greater right side than left side but this is a chronic issue for him.  Bicep tricep strength appear the same.  Labs  No results found for: HGBA1C Lab Results  Component Value Date   CREATININE 0.94 10/13/2015    -------------------------------------------------------------------------------------------------------------------- Lab Results  Component Value Date   WBC 11.1 (H) 10/13/2015   HGB 12.0 (L) 10/13/2015   HCT 34.1 (L) 10/13/2015   PLT 226 10/13/2015   GLUCOSE 114 (H) 10/13/2015   ALT 15 (L) 10/11/2015   AST 21 10/11/2015   NA 133 (L) 10/13/2015   K 4.0 10/13/2015   CL 104 10/13/2015    CREATININE 0.94 10/13/2015   BUN 11 10/13/2015   CO2 26 10/13/2015    --------------------------------------------------------------------------------------------------------------------- No results found.   Assessment & Plan:   Arek was seen today for abdominal pain and neck pain.  Diagnoses and all orders for this visit:  Chronic abdominal pain  Chronic pain syndrome  Crohn's disease without complication, unspecified gastrointestinal tract location (HCC)  Chronic, continuous use of opioids  Facet arthritis of lumbar region  Cervicalgia  Chronic bilateral low back pain without sciatica  Other orders -     Discontinue: oxyCODONE-acetaminophen (PERCOCET) 10-325 MG tablet; Take 1 tablet by mouth 3 (three) times daily. -     oxyCODONE-acetaminophen (PERCOCET) 10-325 MG tablet; Take 1 tablet by mouth 3 (three) times daily.        ----------------------------------------------------------------------------------------------------------------------  Problem List Items Addressed This Visit      Unprioritized   Chronic abdominal pain - Primary   Relevant Medications   oxyCODONE-acetaminophen (PERCOCET) 10-325 MG tablet   Chronic pain syndrome (Chronic)   Crohn disease ([place])    Other Visit Diagnoses    Chronic, continuous use of opioids       Facet arthritis of lumbar region       Relevant Medications   oxyCODONE-acetaminophen (PERCOCET) 10-325 MG tablet   Cervicalgia       Chronic bilateral low back pain without sciatica       Relevant Medications   oxyCODONE-acetaminophen (PERCOCET) 10-325 MG tablet        ----------------------------------------------------------------------------------------------------------------------  1. Chronic abdominal pain Continue follow-up with his GI physicians.  In the meantime we will keep him on the oxycodone he seems to be doing well with this regimen based on his narcotic assessment sheet.  We have also reviewed the [facility] practitioner database information and it is appropriate.  Refills will be given for today and for June 22.  He is to return to clinic in 2 months.  Continue follow-up with his GI physicians and neurosurgeon as scheduled.  2. Chronic pain syndrome As above  3. Crohn's disease without complication, unspecified gastrointestinal tract location [facility]) As above  4. Chronic, continuous use of opioids As above.  We have a previous UDS that is positive for THC consistent we feel with the Marinol baseline medication.  He denies any use of other marijuana products  5. Facet arthritis of lumbar region As above  6. Cervicalgia As above  7. Chronic bilateral low back pain without sciatica As above    ----------------------------------------------------------------------------------------------------------------------  I am having Grace Bushy. Dockter maintain his amlodipine-atorvastatin, dronabinol, naloxone, IRON PO, gabapentin, magic mouthwash w/lidocaine, hyoscyamine, famotidine, and oxyCODONE-acetaminophen.   Meds ordered this encounter  Medications  . DISCONTD: oxyCODONE-acetaminophen (PERCOCET) 10-325 MG tablet    Sig: Take 1 tablet by mouth 3 (three) times daily.  Dispense:  75 tablet    Refill:  0  . oxyCODONE-acetaminophen (PERCOCET) 10-325 MG tablet    Sig: Take 1 tablet by mouth 3 (three) times daily.    Dispense:  75 tablet    Refill:  0    Do not fill until [id number]   Patient's Medications  New Prescriptions   No medications on file  Previous Medications   AMLODIPINE-ATORVASTATIN (CADUET) 10-10 MG TABLET    Take 1 tablet by mouth daily.   DRONABINOL (MARINOL) 10 MG CAPSULE    Take 10 mg by mouth 2 (two) times daily before a meal.   FAMOTIDINE (PEPCID) 20 MG TABLET    Take 20 mg by mouth 2 (two) times daily.   GABAPENTIN (NEURONTIN) 100 MG CAPSULE    Take 1 capsule (100 mg total) by mouth at bedtime.   HYOSCYAMINE (LEVSIN, ANASPAZ) 0.125 MG TABLET    Take 1 tablet  by mouth as needed.   IRON PO    Take by mouth.   MAGIC MOUTHWASH W/LIDOCAINE SOLN    Take 5 mLs by mouth 4 (four) times daily.   NALOXONE (NARCAN) NASAL SPRAY 4 MG/0.1 ML    For respiratory depression from opioids  Modified Medications   Modified Medication Previous Medication   OXYCODONE-ACETAMINOPHEN (PERCOCET) 10-325 MG TABLET oxyCODONE-acetaminophen (PERCOCET) 10-325 MG tablet      Take 1 tablet by mouth 3 (three) times daily.    Take 1 tablet by mouth 3 (three) times daily.  Discontinued Medications   No medications on file   ----------------------------------------------------------------------------------------------------------------------  Follow-up: Return in about 2 months (around 08/19/2017) for evaluation, med refill.    [name], MD

## 2017-06-19 NOTE — Patient Instructions (Signed)
You have been given 2 Rx for Percocet to lat until 08/18/2017

## 2017-06-19 NOTE — Progress Notes (Signed)
Nursing Pain Medication Assessment:  Safety precautions to be maintained throughout the outpatient stay will include: orient to surroundings, keep bed in low position, maintain call bell within reach at all times, provide assistance with transfer out of bed and ambulation.  Medication Inspection Compliance: Pill count conducted under aseptic conditions, in front of the patient. Neither the pills nor the bottle was removed from the patient's sight at any time. Once count was completed pills were immediately returned to the patient in their original bottle.  Medication: Oxycodone/APAP Pill/Patch Count: 0 of 75 pills remain Pill/Patch Appearance: Markings consistent with prescribed medication Bottle Appearance: Standard pharmacy container. Clearly labeled. Filled Date: 04 / 24 / 2019 Last Medication intake:  Today

## 2017-07-01 ENCOUNTER — Institutional Professional Consult (permissible substitution): Admit: 2017-07-01 | Discharge: 2017-07-02 | Payer: MEDICARE

## 2017-07-01 DIAGNOSIS — K50919 Crohn's disease, unspecified, with unspecified complications: Principal | ICD-10-CM

## 2017-07-01 MED ORDER — DRONABINOL 5 MG CAPSULE
ORAL_CAPSULE | Freq: Two times a day (BID) | ORAL | 5 refills | 0.00000 days | Status: CP
Start: 2017-07-01 — End: 2017-10-14

## 2017-07-14 MED ORDER — PROMETHAZINE 12.5 MG TABLET
ORAL_TABLET | 3 refills | 0 days | Status: CP
Start: 2017-07-14 — End: 2017-11-30

## 2017-08-10 ENCOUNTER — Emergency Department
Admission: EM | Admit: 2017-08-10 | Discharge: 2017-08-10 | Disposition: A | Discharge status: Home / Self Care | Payer: Medicare Other | Attending: Emergency Medicine | Admitting: Emergency Medicine

## 2017-08-10 ENCOUNTER — Other Ambulatory Visit: Payer: Self-pay

## 2017-08-10 ENCOUNTER — Encounter: Payer: Self-pay | Admitting: Emergency Medicine

## 2017-08-10 DIAGNOSIS — S0501XA Injury of conjunctiva and corneal abrasion without foreign body, right eye, initial encounter: Secondary | ICD-10-CM | POA: Diagnosis not present

## 2017-08-10 DIAGNOSIS — I10 Essential (primary) hypertension: Secondary | ICD-10-CM | POA: Diagnosis not present

## 2017-08-10 DIAGNOSIS — Y999 Unspecified external cause status: Secondary | ICD-10-CM | POA: Diagnosis not present

## 2017-08-10 DIAGNOSIS — Z87891 Personal history of nicotine dependence: Secondary | ICD-10-CM | POA: Insufficient documentation

## 2017-08-10 DIAGNOSIS — Y939 Activity, unspecified: Secondary | ICD-10-CM | POA: Diagnosis not present

## 2017-08-10 DIAGNOSIS — Z79899 Other long term (current) drug therapy: Secondary | ICD-10-CM | POA: Insufficient documentation

## 2017-08-10 DIAGNOSIS — S0993XA Unspecified injury of face, initial encounter: Secondary | ICD-10-CM | POA: Diagnosis present

## 2017-08-10 DIAGNOSIS — W228XXA Striking against or struck by other objects, initial encounter: Secondary | ICD-10-CM | POA: Diagnosis not present

## 2017-08-10 DIAGNOSIS — Y929 Unspecified place or not applicable: Secondary | ICD-10-CM | POA: Diagnosis not present

## 2017-08-10 MED ORDER — TETRACAINE HCL 0.5 % OP SOLN
OPHTHALMIC | Status: AC
  Administered 2017-08-10: 1 [drp] via OPHTHALMIC
  Filled 2017-08-10: qty 4

## 2017-08-10 MED ORDER — OLOPATADINE HCL 0.2 % OP SOLN: 1.0000 [drp] | Freq: Every day | OPHTHALMIC | 0 refills | Status: AC

## 2017-08-10 MED ORDER — GENTAMICIN SULFATE 0.3 % OP SOLN: 1.0000 [drp] | OPHTHALMIC | 0 refills | Status: DC

## 2017-08-10 MED ORDER — FLUORESCEIN SODIUM 1 MG OP STRP
ORAL_STRIP | OPHTHALMIC | Status: AC
  Administered 2017-08-10: 1 via OPHTHALMIC
  Filled 2017-08-10: qty 1

## 2017-08-10 MED ORDER — TETRACAINE HCL 0.5 % OP SOLN
1.0000 [drp] | Freq: Once | OPHTHALMIC | Status: AC
  Administered 2017-08-10: 1 [drp] via OPHTHALMIC

## 2017-08-10 MED ORDER — EYE WASH OPHTH SOLN
OPHTHALMIC | Status: AC
  Administered 2017-08-10: 1 [drp] via OPHTHALMIC
  Filled 2017-08-10: qty 118

## 2017-08-10 MED ORDER — EYE WASH OPHTH SOLN
1.0000 [drp] | OPHTHALMIC | Status: DC | PRN
  Administered 2017-08-10: 1 [drp] via OPHTHALMIC

## 2017-08-10 MED ORDER — FLUORESCEIN SODIUM 1 MG OP STRP
1.0000 | ORAL_STRIP | Freq: Once | OPHTHALMIC | Status: AC
  Administered 2017-08-10: 1 via OPHTHALMIC

## 2017-08-10 NOTE — ED Notes (Signed)
See triage note  Presents with right eye pain   States he was hit in eye with a tree limb

## 2017-08-10 NOTE — ED Provider Notes (Signed)
[facility] Emergency Department Provider Note   ____________________________________________   First MD Initiated Contact with Patient 08/10/17 1819     (approximate)  I have reviewed the triage vital signs and the nursing notes.   HISTORY  Chief Complaint Eye Injury    HPI Benjamin Murillo is a 47 y.o. male patient complain right eye pain secondary to being struck by a tree limb.  Incident occurred prior to arrival.  Patient unable to open eyes secondary to pain.  Patient rates pain as a 10/10.  Patient described the pain is "aching".  No palliative measures for complaint.  Past Medical History:  Diagnosis Date  . Cervical radiculopathy   . Chronic abdominal pain   . Chronic neck pain   . Cold   . Crohn disease ([place])   . GERD (gastroesophageal reflux disease)    no meds-2-3 times weekly  . Hand fracture, left   . Headache(784.0)   . Hypertension    no meds   . IBS (irritable bowel syndrome)   . Shortness of breath    "sometimes laying down; sometimes w/activity" (10/13/2012)    Patient Active Problem List   Diagnosis Date Noted  . Chronic pain syndrome 06/27/2016  . Long term current use of opiate analgesic 06/27/2016  . S/P excision of lipoma 02/07/2016  . AP (abdominal pain)   . Crohn disease ([place])   . Crohn's colitis ([place]) 01/03/2014  . HNP (herniated nucleus pulposus), cervical 02/12/2013  . Tobacco abuse 10/14/2012  . Noncompliance 10/14/2012  . Acute abdominal pain 10/13/2012  . Chronic abdominal pain   . Crohn's ileitis ([place]) 06/06/2012  . Disorder of intestine 10/26/2010  . Disorder of stomach and duodenum 10/26/2010  . 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 Duodenal ulcer 05/22/2010  . Drug dependence ([place]) 03/25/2009  . Mixed, or nondependent drug abuse, in remission ([place]) 03/25/2009  . Personal history of digestive disease 02/05/2008  . Nausea with vomiting 12/31/2004  . Diarrhea 01/18/2004     Past Surgical History:  Procedure Laterality Date  . ANTERIOR CERVICAL DECOMP/DISCECTOMY FUSION N/A 02/12/2013   Procedure: ANTERIOR CERVICAL DECOMPRESSION/DISCECTOMY FUSION 1 LEVEL;  Surgeon: Winfield Cunas, MD;  Location: [facility] NEURO ORS;  Service: Neurosurgery;  Laterality: N/A;  C67 anterior cervical decompression with fusion plating and bonegraft  . BOWEL RESECTION     x2  . COLON SURGERY     drains placed to drain cyst  . COLONOSCOPY    . COLONOSCOPY WITH PROPOFOL N/A 11/13/2015   Procedure: COLONOSCOPY WITH PROPOFOL;  Surgeon: Manya Silvas, MD;  Location[facility] END[facility]COPY;  Service: Endoscopy;  Laterality: N/A;  . ESOPHAGOGASTRODUODENOSCOPY (EGD) WITH PROPOFOL N/A 11/13/2015   Procedure: ESOPHAGOGASTRODUODENOSCOPY (EGD) WITH PROPOFOL;  Surgeon: Manya Silvas, MD;  Location: [facility] ENDOSCOPY;  Service: Endoscopy;  Laterality: N/A;  . LIPOMA EXCISION Right 01/24/2016   Procedure: EXCISION LIPOMA;  Surgeon: Olean Ree, MD;  Location: [facility] ORS;  Service: General;  Laterality: Right;  right back lipoma excision, patient should be prone  . NEVUS EXCISION     left arm + hand    Prior to Admission medications   Medication Sig Start Date End Date Taking? Authorizing Provider  amlodipine-atorvastatin (CADUET) 10-10 MG tablet Take 1 tablet by mouth daily.    [provider]  dronabinol (MARINOL) 10 MG capsule Take 10 mg by mouth 2 (two) times daily before a meal.    [provider]  famotidine (PEPCID) 20 MG tablet  Take 20 mg by mouth 2 (two) times daily.    [provider]  gabapentin (NEURONTIN) 100 MG capsule Take 1 capsule (100 mg total) by mouth at bedtime. 02/18/17 04/19/17  Vevelyn Francois, NP  gentamicin (GARAMYCIN) 0.3 % ophthalmic solution Place 1 drop into the right eye every 4 (four) hours. 08/10/17   Sable Feil, PA-C  hyoscyamine (LEVSIN, ANASPAZ) 0.125 MG tablet Take 1 tablet by mouth as needed. 03/26/17 03/26/18  [provider]  IRON  PO Take by mouth. 01/10/17   [provider]  magic mouthwash w/lidocaine SOLN Take 5 mLs by mouth 4 (four) times daily. 05/30/17   Sable Feil, PA-C  naloxone [facility]) nasal spray 4 mg/0.1 mL For respiratory depression from opioids 12/18/16   [name], MD  Olopatadine HCl 0.2 % SOLN Apply 1 drop to eye daily for 1 dose. Apply drops to right eye as directed. 08/10/17 08/11/17  Sable Feil, PA-C  oxyCODONE-acetaminophen (PERCOCET) 10-325 MG tablet Take 1 tablet by mouth 3 (three) times daily. 06/19/17   [name], MD    Allergies Vicodin [hydrocodone-acetaminophen]; Tramadol; and Morphine and related  Family History  Problem Relation Age of Onset  . Hyperlipidemia Mother   . Cancer Maternal Aunt   . Diabetes Paternal Aunt     Social History Social History   Tobacco Use  . Smoking status: Former Smoker    Packs/day: 0.10    Years: 22.00    Pack years: 2.20    Types: Cigarettes  . Smokeless tobacco: Never Used  . Tobacco comment: states is progress of stopping 06/27/16  Substance Use Topics  . Alcohol use: No    Alcohol/week: 0.0 oz  . Drug use: Not Currently    Types: Marijuana    Review of Systems  Constitutional: No fever/chills Eyes: No visual changes. ENT: No sore throat. Cardiovascular: Denies chest pain. Respiratory: Denies shortness of breath. Gastrointestinal: No abdominal pain.  No nausea, no vomiting.  No diarrhea.  No constipation. Genitourinary: Negative for dysuria. Musculoskeletal: Negative for back pain. Skin: Negative for rash. Neurological: Negative for headaches, focal weakness or numbness. Endocrine:Hypertension Allergic/Immunilogical: See medication list. ____________________________________________   PHYSICAL EXAM:  VITAL SIGNS: ED Triage Vitals  Enc Vitals Group     BP 08/10/17 1735 (!) 176/96     Pulse Rate 08/10/17 1735 65     Resp 08/10/17 1735 16     Temp 08/10/17 1735 98.2 F (36.8 C)     Temp Source  08/10/17 1735 Oral     SpO2 08/10/17 1735 100 %     Weight 08/10/17 1726 130 lb (59 kg)     Height 08/10/17 1726 5\' 11"  (1.803 m)     Head Circumference --      Peak Flow --      Pain Score 08/10/17 1726 10     Pain Loc --      Pain Edu? --      Excl. in [place]? --    Constitutional: Alert and oriented.  Moderate distress.   Eyes: Physical exam unobtainable secondary to patient complaint of pain.  Cardiovascular: Normal rate, regular rhythm. Grossly normal heart sounds.  Good peripheral circulation.  Elevated blood pressure Respiratory: Normal respiratory effort.  No retractions. Lungs CTAB. Skin:  Skin is warm, dry and intact. No rash noted. Psychiatric: Mood and affect are normal. Speech and behavior are normal.  ____________________________________________   LABS (all labs ordered are listed, but only abnormal results  are displayed)  Labs Reviewed - No data to display ____________________________________________  EKG   ____________________________________________  RADIOLOGY  ED MD interpretation:    Official radiology report(s): No results found.  ____________________________________________   PROCEDURES  Procedure(s) performed: None  Procedures  Critical Care performed: No  ____________________________________________   INITIAL IMPRESSION / ASSESSMENT AND PLAN / ED COURSE  As part of my medical decision making, I reviewed the following data within the [place]    Patient right eye was anesthetized with tetracaine eyedrops.  Fluoroscopy stain reveal right corneal abrasion.  No puncture wound.  I was copiously irrigated.  Patient given discharge care instructions.  Patient advised use eyedrops as directed.  Patient advised to worsen with complaint to follow-up with the [facility].      ____________________________________________   FINAL CLINICAL IMPRESSION(S) / ED DIAGNOSES  Final diagnoses:  Right corneal abrasion,  initial encounter     ED Discharge Orders        Ordered    gentamicin (GARAMYCIN) 0.3 % ophthalmic solution  Every 4 hours     08/10/17 1831    Olopatadine HCl 0.2 % SOLN  Daily     08/10/17 1831       Note:  This document was prepared using Dragon voice recognition software and may include unintentional dictation errors.    Sable Feil, PA-C 08/10/17 [place], [place], MD 08/10/17 2000

## 2017-08-10 NOTE — ED Triage Notes (Signed)
Pt in via POV with eye injury to right eye, reports being stuck in eye with tree limb prior to arrival.  Pt unable to open eye in triage.  NAD noted at this time.

## 2017-08-11 ENCOUNTER — Emergency Department (HOSPITAL_COMMUNITY)
Admission: EM | Admit: 2017-08-11 | Discharge: 2017-08-11 | Disposition: A | Discharge status: Home / Self Care | Payer: Medicare Other | Attending: Emergency Medicine | Admitting: Emergency Medicine

## 2017-08-11 ENCOUNTER — Other Ambulatory Visit: Payer: Self-pay

## 2017-08-11 ENCOUNTER — Encounter (HOSPITAL_COMMUNITY): Payer: Self-pay | Admitting: Emergency Medicine

## 2017-08-11 DIAGNOSIS — S0591XD Unspecified injury of right eye and orbit, subsequent encounter: Secondary | ICD-10-CM | POA: Diagnosis present

## 2017-08-11 DIAGNOSIS — Z87891 Personal history of nicotine dependence: Secondary | ICD-10-CM | POA: Insufficient documentation

## 2017-08-11 DIAGNOSIS — S0501XD Injury of conjunctiva and corneal abrasion without foreign body, right eye, subsequent encounter: Secondary | ICD-10-CM | POA: Diagnosis not present

## 2017-08-11 DIAGNOSIS — W228XXD Striking against or struck by other objects, subsequent encounter: Secondary | ICD-10-CM | POA: Diagnosis not present

## 2017-08-11 DIAGNOSIS — Z79899 Other long term (current) drug therapy: Secondary | ICD-10-CM | POA: Insufficient documentation

## 2017-08-11 DIAGNOSIS — H538 Other visual disturbances: Secondary | ICD-10-CM | POA: Diagnosis not present

## 2017-08-11 DIAGNOSIS — I10 Essential (primary) hypertension: Secondary | ICD-10-CM | POA: Diagnosis not present

## 2017-08-11 MED ORDER — FLUORESCEIN SODIUM 1 MG OP STRP
1.0000 | ORAL_STRIP | Freq: Once | OPHTHALMIC | Status: AC
  Administered 2017-08-11: 1 via OPHTHALMIC
  Filled 2017-08-11: qty 1

## 2017-08-11 MED ORDER — TETRACAINE HCL 0.5 % OP SOLN
2.0000 [drp] | Freq: Once | OPHTHALMIC | Status: AC
  Administered 2017-08-11: 2 [drp] via OPHTHALMIC
  Filled 2017-08-11: qty 4

## 2017-08-11 NOTE — Discharge Instructions (Signed)
Continue using the eye drops prescribed from the other emergency department. Your eye will start to feel better as it begins to heal over the next 2-3 days.

## 2017-08-11 NOTE — ED Triage Notes (Signed)
Pt with c/o right eye injury from limp sticking his eye 2 days ago. He was seen [facility] yesterday and was given antibiotic eye drops. Today he reports pain and vision getting worse.

## 2017-08-11 NOTE — ED Provider Notes (Signed)
[facility] EMERGENCY DEPARTMENT Provider Note  CSN: [id number] Arrival date & time: 08/11/17  1304    History   Chief Complaint Chief Complaint  Patient presents with  . Eye Pain    HPI Benjamin Murillo is a 47 y.o. male who presented to the ED for right eye pain x2 days. Patient states he was stuck in the eye by a tree limb when doing yard work yesterday. Patient was seen at [facility] ED yesterday 08/10/17 and was treated for corneal abrasion. This is supported by medical chart. He was given ophthalmic Gentamicin and Olopatadine drops. He states he returned to the ED because he continues to have eye pain today. There has been no worsening in ophthalmic complaints.   Past Medical History:  Diagnosis Date  . Cervical radiculopathy   . Chronic abdominal pain   . Chronic neck pain   . Cold   . Crohn disease ([place])   . GERD (gastroesophageal reflux disease)    no meds-2-3 times weekly  . Hand fracture, left   . Headache(784.0)   . Hypertension    no meds   . IBS (irritable bowel syndrome)   . Shortness of breath    "sometimes laying down; sometimes w/activity" (10/13/2012)    Patient Active Problem List   Diagnosis Date Noted  . Chronic pain syndrome 06/27/2016  . Long term current use of opiate analgesic 06/27/2016  . S/P excision of lipoma 02/07/2016  . AP (abdominal pain)   . Crohn disease ([place])   . Crohn's colitis ([place]) 01/03/2014  . HNP (herniated nucleus pulposus), cervical 02/12/2013  . Tobacco abuse 10/14/2012  . Noncompliance 10/14/2012  . Acute abdominal pain 10/13/2012  . Chronic abdominal pain   . Crohn's ileitis ([place]) 06/06/2012  . Disorder of intestine 10/26/2010  . Disorder of stomach and duodenum 10/26/2010  . 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 Duodenal ulcer 05/22/2010  . Drug dependence ([place]) 03/25/2009  . Mixed, or nondependent drug abuse, in remission ([place]) 03/25/2009  . Personal history of digestive disease  02/05/2008  . Nausea with vomiting 12/31/2004  . Diarrhea 01/18/2004    Past Surgical History:  Procedure Laterality Date  . ANTERIOR CERVICAL DECOMP/DISCECTOMY FUSION N/A 02/12/2013   Procedure: ANTERIOR CERVICAL DECOMPRESSION/DISCECTOMY FUSION 1 LEVEL;  Surgeon: Winfield Cunas, MD;  Location: [facility]URO ORS;  Service: Neurosurgery;  Laterality: N/A;  C67 anterior cervical decompression with fusion plating and bonegraft  . BOWEL RESECTION     x2  . COLON SURGERY     drains placed to drain cyst  . COLONOSCOPY    . COLONOSCOPY WITH PROPOFOL N/A 11/13/2015   Procedure: COLONOSCOPY WITH PROPOFOL;  Surgeon: Manya Silvas, MD;  Location: [facility] ENDOSCOPY;  Service: Endoscopy;  Laterality: N/A;  . ESOPHAGOGASTRODUODENOSCOPY (EGD) WITH PROPOFOL N/A 11/13/2015   Procedure: ESOPHAGOGASTRODUODENOSCOPY (EGD) WITH PROPOFOL;  Surgeon: Manya Silvas, MD;  Location: [facility] ENDOSCOPY;  Service: Endoscopy;  Laterality: N/A;  . LIPOMA EXCISION Right 01/24/2016   Procedure: EXCISION LIPOMA;  Surgeon: Olean Ree, MD;  Location: [facility] ORS;  Service: General;  Laterality: Right;  right back lipoma excision, patient should be prone  . NEVUS EXCISION     left arm + hand        Home Medications    Prior to Admission medications   Medication Sig Start Date End Date Taking? Authorizing Provider  amlodipine-atorvastatin (CADUET) 10-10 MG tablet Take 1 tablet by mouth daily.    [provider]  dronabinol (MARINOL) 10 MG capsule Take 10 mg by mouth 2 (two) times daily before a meal.    [provider]  famotidine (PEPCID) 20 MG tablet Take 20 mg by mouth 2 (two) times daily.    [provider]  gabapentin (NEURONTIN) 100 MG capsule Take 1 capsule (100 mg total) by mouth at bedtime. 02/18/17 04/19/17  Vevelyn Francois, NP  gentamicin (GARAMYCIN) 0.3 % ophthalmic solution Place 1 drop into the right eye every 4 (four) hours. 08/10/17   Sable Feil, PA-C  hyoscyamine (LEVSIN, ANASPAZ)  0.125 MG tablet Take 1 tablet by mouth as needed. 03/26/17 03/26/18  [provider]  IRON PO Take by mouth. 01/10/17   [provider]  magic mouthwash w/lidocaine SOLN Take 5 mLs by mouth 4 (four) times daily. 05/30/17   Sable Feil, PA-C  naloxone [facility]) nasal spray 4 mg/0.1 mL For respiratory depression from opioids 12/18/16   [name], MD  Olopatadine HCl 0.2 % SOLN Apply 1 drop to eye daily for 1 dose. Apply drops to right eye as directed. 08/10/17 08/11/17  Sable Feil, PA-C  oxyCODONE-acetaminophen (PERCOCET) 10-325 MG tablet Take 1 tablet by mouth 3 (three) times daily. 06/19/17   [name], MD    Family History Family History  Problem Relation Age of Onset  . Hyperlipidemia Mother   . Cancer Maternal Aunt   . Diabetes Paternal Aunt     Social History Social History   Tobacco Use  . Smoking status: Former Smoker    Packs/day: 0.10    Years: 22.00    Pack years: 2.20    Types: Cigarettes  . Smokeless tobacco: Never Used  . Tobacco comment: states is progress of stopping 06/27/16  Substance Use Topics  . Alcohol use: No    Alcohol/week: 0.0 oz  . Drug use: Not Currently    Types: Marijuana     Allergies   Vicodin [hydrocodone-acetaminophen]; Tramadol; and Morphine and related   Review of Systems Review of Systems  Constitutional: Negative for chills and fever.  Eyes: Positive for pain and visual disturbance. Negative for photophobia, discharge, redness and itching.  Skin: Negative.   Allergic/Immunologic: Negative.   Neurological: Positive for headaches. Negative for dizziness, seizures, facial asymmetry, speech difficulty, light-headedness and numbness.     Physical Exam Updated Vital Signs BP (!) 152/89 (BP Location: Right Arm)   Pulse 62   Temp 98 F (36.7 C) (Oral)   Resp 16   Ht 5' 11"  (1.803 m)   Wt 59 kg (130 lb)   SpO2 99%   BMI 18.13 kg/m   Physical Exam  Constitutional: He appears well-developed and  well-nourished. No distress.  HENT:  Head: Normocephalic and atraumatic.  Eyes: Pupils are equal, round, and reactive to light. Conjunctivae, EOM and lids are normal. Lids are everted and swept, no foreign bodies found. Right eye exhibits no chemosis and no discharge. No foreign body present in the right eye. Left eye exhibits no chemosis and no discharge. No foreign body present in the left eye. Right conjunctiva is not injected. Left conjunctiva is not injected.  Slit lamp exam:      The right eye shows corneal abrasion. The right eye shows no corneal flare, no corneal ulcer and no foreign body.       The left eye shows no corneal flare, no corneal ulcer and no foreign body.    Nursing note and vitals reviewed.    ED Treatments /  Results  Labs (all labs ordered are listed, but only abnormal results are displayed) Labs Reviewed - No data to display  EKG None  Radiology No results found.  Procedures Procedures (including critical care time)  Medications Ordered in ED Medications  fluorescein ophthalmic strip 1 strip (1 strip Right Eye Given 08/11/17 1436)  tetracaine (PONTOCAINE) 0.5 % ophthalmic solution 2 drop (2 drops Right Eye Given 08/11/17 1436)     Initial Impression / Assessment and Plan / ED Course  Triage vital signs and the nursing notes have been reviewed.  Pertinent labs & imaging results that were available during care of the patient were reviewed and considered in medical decision making (see chart for details).   Patient presents to the ED for the 2nd time in 24 hours for the same complaint of right eye pain. Patient was seen and treated appropriately at [facility] for a corneal abrasion. He is concerned because he still has eye pain and was under the impression that either the Gentamicin or Olopatadine that he was prescribed was for pain. Repeat eye exam confirms corneal abrasion diagnosis. No foreign bodies, corneal ulcer or other ophthalmic issues were identified at  this time. There has been no worsening in symptoms. Thorough education was provided on corneal abrasions, appropriate treatment and current medications for it.  Final Clinical Impressions(s) / ED Diagnoses  1. Right Corneal Abrasion. Educated patient on medications he received at [facility] and advised to continue these medications. Also educated on concerning s/s that would warrant return to the ED and when he should anticipate relief in symptoms.  Dispo: Home. After thorough clinical evaluation, this patient is determined to be medically stable and can be safely discharged with the previously mentioned treatment and/or outpatient follow-up/referral(s). At this time, there are no other apparent medical conditions that require further screening, evaluation or treatment.   Final diagnoses:  Abrasion of right cornea, subsequent encounter    ED Discharge Orders    None        Junita Push 08/11/17 Matthews, [place], DO 08/12/17 [phone]

## 2017-08-19 ENCOUNTER — Other Ambulatory Visit: Payer: Self-pay

## 2017-08-19 ENCOUNTER — Ambulatory Visit: Payer: Medicare Other | Attending: Anesthesiology | Admitting: Anesthesiology

## 2017-08-19 ENCOUNTER — Encounter: Payer: Self-pay | Admitting: Anesthesiology

## 2017-08-19 VITALS — BP 138/97 | HR 62 | Temp 98.6°F | Resp 18 | Ht 71.0 in | Wt 135.0 lb

## 2017-08-19 DIAGNOSIS — M545 Low back pain, unspecified: Secondary | ICD-10-CM

## 2017-08-19 DIAGNOSIS — M79606 Pain in leg, unspecified: Secondary | ICD-10-CM | POA: Diagnosis not present

## 2017-08-19 DIAGNOSIS — M47816 Spondylosis without myelopathy or radiculopathy, lumbar region: Secondary | ICD-10-CM

## 2017-08-19 DIAGNOSIS — M549 Dorsalgia, unspecified: Secondary | ICD-10-CM

## 2017-08-19 DIAGNOSIS — M4686 Other specified inflammatory spondylopathies, lumbar region: Secondary | ICD-10-CM | POA: Diagnosis not present

## 2017-08-19 DIAGNOSIS — Z79899 Other long term (current) drug therapy: Secondary | ICD-10-CM | POA: Diagnosis not present

## 2017-08-19 DIAGNOSIS — K5090 Crohn's disease, unspecified, without complications: Secondary | ICD-10-CM | POA: Diagnosis not present

## 2017-08-19 DIAGNOSIS — F1190 Opioid use, unspecified, uncomplicated: Secondary | ICD-10-CM | POA: Diagnosis not present

## 2017-08-19 DIAGNOSIS — Z79891 Long term (current) use of opiate analgesic: Secondary | ICD-10-CM | POA: Diagnosis not present

## 2017-08-19 DIAGNOSIS — M542 Cervicalgia: Secondary | ICD-10-CM | POA: Insufficient documentation

## 2017-08-19 DIAGNOSIS — G894 Chronic pain syndrome: Secondary | ICD-10-CM | POA: Insufficient documentation

## 2017-08-19 DIAGNOSIS — G8929 Other chronic pain: Secondary | ICD-10-CM

## 2017-08-19 DIAGNOSIS — R109 Unspecified abdominal pain: Secondary | ICD-10-CM | POA: Insufficient documentation

## 2017-08-19 DIAGNOSIS — M5020 Other cervical disc displacement, unspecified cervical region: Secondary | ICD-10-CM

## 2017-08-19 MED ORDER — OXYCODONE-ACETAMINOPHEN 10-325 MG PO TABS: 1.0000 | ORAL_TABLET | Freq: Three times a day (TID) | ORAL | 0 refills | Status: DC

## 2017-08-19 NOTE — Progress Notes (Signed)
Nursing Pain Medication Assessment:  Safety precautions to be maintained throughout the outpatient stay will include: orient to surroundings, keep bed in low position, maintain call bell within reach at all times, provide assistance with transfer out of bed and ambulation.  Medication Inspection Compliance: Pill count conducted under aseptic conditions, in front of the patient. Neither the pills nor the bottle was removed from the patient's sight at any time. Once count was completed pills were immediately returned to the patient in their original bottle.  Medication: Oxycodone/APAP Pill/Patch Count: 0 of 75 pills remain Pill/Patch Appearance: Markings consistent with prescribed medication Bottle Appearance: Standard pharmacy container. Clearly labeled. Filled Date: 06/22 / 2019 Last Medication intake:  Today

## 2017-08-19 NOTE — Patient Instructions (Signed)
You were given 2 prescriptions for Percocet today. 

## 2017-08-21 NOTE — Progress Notes (Signed)
Subjective:  Patient ID: Benjamin Murillo, male    DOB: 1970-05-24  Age: 47 y.o. MRN: [id number]  CC: Abdominal Pain   Procedure: None  HPI Benjamin Murillo presents for evaluation.  He was last seen 2 months ago and is been doing well with his current regimen.  His abdominal pain neck pain and lower extremity pain have been well controlled with this regimen.  Based on his narcotic assessment sheet he continues to derive good functional lifestyle improvement with his medications.  No diverting or illicit use is noted.  The quality characteristic and distribution of his pain of also been stable in nature.  He continues follow-up with his GI doctors.  He states that otherwise he is in his usual state of health at this point.  Outpatient Medications Prior to Visit  Medication Sig Dispense Refill  . amlodipine-atorvastatin (CADUET) 10-10 MG tablet Take 1 tablet by mouth daily.    Marland Kitchen dronabinol (MARINOL) 10 MG capsule Take 10 mg by mouth 2 (two) times daily before a meal.    . famotidine (PEPCID) 20 MG tablet Take 20 mg by mouth 2 (two) times daily.    . hyoscyamine (LEVSIN, ANASPAZ) 0.125 MG tablet Take 1 tablet by mouth as needed.    . IRON PO Take by mouth.    . naloxone (NARCAN) nasal spray 4 mg/0.1 mL For respiratory depression from opioids 1 kit 2  . oxyCODONE-acetaminophen (PERCOCET) 10-325 MG tablet Take 1 tablet by mouth 3 (three) times daily. 75 tablet 0  . gabapentin (NEURONTIN) 100 MG capsule Take 1 capsule (100 mg total) by mouth at bedtime. 30 capsule 1  . gentamicin (GARAMYCIN) 0.3 % ophthalmic solution Place 1 drop into the right eye every 4 (four) hours. (Patient not taking: Reported on 08/19/2017) 5 mL 0  . magic mouthwash w/lidocaine SOLN Take 5 mLs by mouth 4 (four) times daily. (Patient not taking: Reported on 08/19/2017) 100 mL 0   No facility-administered medications prior to visit.     Review of Systems CNS: No confusion or sedation Cardiac: No angina or  palpitations GI: No abdominal pain or constipation Constitutional: No nausea vomiting fevers or chills  Objective:  BP (!) 138/97   Pulse 62   Temp 98.6 F (37 C) (Oral)   Resp 18   Ht 5' 11" (1.803 m)   Wt 135 lb (61.2 kg)   SpO2 100%   BMI 18.83 kg/m    BP Readings from Last 3 Encounters:  08/19/17 (!) 138/97  08/11/17 (!) 152/89  08/10/17 (!) 176/96     Wt Readings from Last 3 Encounters:  08/19/17 135 lb (61.2 kg)  08/11/17 130 lb (59 kg)  08/10/17 130 lb (59 kg)     Physical Exam Pt is alert and oriented PERRL EOMI HEART IS RRR no murmur or rub LCTA no wheezing or rales MUSCULOSKELETAL reveals some abdominal tenderness.  He is ambulating well with good muscle tone and bulk to the lower and upper extremities.  Labs  No results found for: HGBA1C Lab Results  Component Value Date   CREATININE 0.94 10/13/2015    -------------------------------------------------------------------------------------------------------------------- Lab Results  Component Value Date   WBC 11.1 (H) 10/13/2015   HGB 12.0 (L) 10/13/2015   HCT 34.1 (L) 10/13/2015   PLT 226 10/13/2015   GLUCOSE 114 (H) 10/13/2015   ALT 15 (L) 10/11/2015   AST 21 10/11/2015   NA 133 (L) 10/13/2015   K 4.0 10/13/2015   CL 104 10/13/2015  CREATININE 0.94 10/13/2015   BUN 11 10/13/2015   CO2 26 10/13/2015    --------------------------------------------------------------------------------------------------------------------- No results found.   Assessment & Plan:   Anchor was seen today for abdominal pain.  Diagnoses and all orders for this visit:  Chronic abdominal pain  Chronic pain syndrome  Crohn's disease without complication, unspecified gastrointestinal tract location (HCC)  Chronic, continuous use of opioids  Facet arthritis of lumbar region  Cervicalgia  Chronic bilateral low back pain without sciatica  HNP (herniated nucleus pulposus), cervical  Chronic neck and  back pain  Other orders -     Discontinue: oxyCODONE-acetaminophen (PERCOCET) 10-325 MG tablet; Take 1 tablet by mouth 3 (three) times daily. -     oxyCODONE-acetaminophen (PERCOCET) 10-325 MG tablet; Take 1 tablet by mouth 3 (three) times daily.        ----------------------------------------------------------------------------------------------------------------------  Problem List Items Addressed This Visit      Unprioritized   Chronic abdominal pain - Primary   Relevant Medications   oxyCODONE-acetaminophen (PERCOCET) 10-325 MG tablet   Chronic pain syndrome (Chronic)   Crohn disease (HCC)   HNP (herniated nucleus pulposus), cervical    Other Visit Diagnoses    Chronic, continuous use of opioids       Facet arthritis of lumbar region       Relevant Medications   oxyCODONE-acetaminophen (PERCOCET) 10-325 MG tablet   Cervicalgia       Chronic bilateral low back pain without sciatica       Relevant Medications   oxyCODONE-acetaminophen (PERCOCET) 10-325 MG tablet   Chronic neck and back pain       Relevant Medications   oxyCODONE-acetaminophen (PERCOCET) 10-325 MG tablet        ----------------------------------------------------------------------------------------------------------------------  1. Chronic abdominal pain We will continue with her current regimen.  He seems to be doing well with this.  I reviewed his narcotic assessment sheet and he continues to derive good functional lifestyle improvement with the medications.  We have also reviewed the [place] practitioner database information and it is appropriate.  Be given for July 23 and August 22 with return to clinic in 2 months  2. Chronic pain syndrome As above  3. Crohn's disease without complication, unspecified gastrointestinal tract location (HCC) Tinea follow-up with his GI doctors.  4. Chronic, continuous use of opioids As above  5. Facet arthritis of lumbar region Continue with physical  therapy stretching strengthening exercises as reviewed once again today.  6. Cervicalgia As above  7. Chronic bilateral low back pain without sciatica As above  8. HNP (herniated nucleus pulposus), cervical   9. Chronic neck and back pain     ----------------------------------------------------------------------------------------------------------------------  I am having Treylon S. Dicker maintain his amlodipine-atorvastatin, dronabinol, naloxone, IRON PO, gabapentin, magic mouthwash w/lidocaine, hyoscyamine, famotidine, gentamicin, and oxyCODONE-acetaminophen.   Meds ordered this encounter  Medications  . DISCONTD: oxyCODONE-acetaminophen (PERCOCET) 10-325 MG tablet    Sig: Take 1 tablet by mouth 3 (three) times daily.    Dispense:  75 tablet    Refill:  0    Do not fill until 7232019  . oxyCODONE-acetaminophen (PERCOCET) 10-325 MG tablet    Sig: Take 1 tablet by mouth 3 (three) times daily.    Dispense:  75 tablet    Refill:  0    Do not fill until 8222019   Patient's Medications  New Prescriptions   No medications on file  Previous Medications   AMLODIPINE-ATORVASTATIN (CADUET) 10-10 MG TABLET    Take 1 tablet by mouth   daily.   DRONABINOL (MARINOL) 10 MG CAPSULE    Take 10 mg by mouth 2 (two) times daily before a meal.   FAMOTIDINE (PEPCID) 20 MG TABLET    Take 20 mg by mouth 2 (two) times daily.   GABAPENTIN (NEURONTIN) 100 MG CAPSULE    Take 1 capsule (100 mg total) by mouth at bedtime.   GENTAMICIN (GARAMYCIN) 0.3 % OPHTHALMIC SOLUTION    Place 1 drop into the right eye every 4 (four) hours.   HYOSCYAMINE (LEVSIN, ANASPAZ) 0.125 MG TABLET    Take 1 tablet by mouth as needed.   IRON PO    Take by mouth.   MAGIC MOUTHWASH W/LIDOCAINE SOLN    Take 5 mLs by mouth 4 (four) times daily.   NALOXONE (NARCAN) NASAL SPRAY 4 MG/0.1 ML    For respiratory depression from opioids  Modified Medications   Modified Medication Previous Medication   OXYCODONE-ACETAMINOPHEN  (PERCOCET) 10-325 MG TABLET oxyCODONE-acetaminophen (PERCOCET) 10-325 MG tablet      Take 1 tablet by mouth 3 (three) times daily.    Take 1 tablet by mouth 3 (three) times daily.  Discontinued Medications   No medications on file   ----------------------------------------------------------------------------------------------------------------------  Follow-up: Return in about 2 months (around 10/20/2017) for evaluation, med refill.    James G Adams, MD  

## 2017-08-26 ENCOUNTER — Institutional Professional Consult (permissible substitution): Admit: 2017-08-26 | Discharge: 2017-08-27 | Payer: MEDICARE

## 2017-08-26 DIAGNOSIS — K50919 Crohn's disease, unspecified, with unspecified complications: Principal | ICD-10-CM

## 2017-09-12 ENCOUNTER — Ambulatory Visit: Admit: 2017-09-12 | Discharge: 2017-09-12 | Payer: MEDICARE

## 2017-09-12 DIAGNOSIS — R197 Diarrhea, unspecified: Secondary | ICD-10-CM

## 2017-09-12 DIAGNOSIS — K50812 Crohn's disease of both small and large intestine with intestinal obstruction: Principal | ICD-10-CM

## 2017-09-12 DIAGNOSIS — N529 Male erectile dysfunction, unspecified: Secondary | ICD-10-CM

## 2017-09-12 MED ORDER — SILDENAFIL 50 MG TABLET
ORAL_TABLET | Freq: Every day | ORAL | 11 refills | 0 days | Status: SS | PRN
Start: 2017-09-12 — End: 2017-12-21

## 2017-09-12 MED ORDER — DESIPRAMINE 50 MG TABLET
ORAL_TABLET | Freq: Every day | ORAL | 2 refills | 0.00000 days | Status: SS
Start: 2017-09-12 — End: 2017-12-21

## 2017-09-12 MED FILL — DESIPRAMINE 50 MG TABLET: ORAL | 30 days supply | Qty: 30 | Fill #0

## 2017-09-12 MED FILL — SILDENAFIL 50 MG TABLET: 10 days supply | Qty: 10 | Fill #0 | Status: AC

## 2017-09-12 MED FILL — DESIPRAMINE 50 MG TABLET: 30 days supply | Qty: 30 | Fill #0 | Status: AC

## 2017-09-12 MED FILL — SILDENAFIL 50 MG TABLET: ORAL | 10 days supply | Qty: 10 | Fill #0

## 2017-10-03 MED ORDER — USTEKINUMAB 90 MG/ML SUBCUTANEOUS SYRINGE
SUBCUTANEOUS | 5 refills | 0 days | Status: CP
Start: 2017-10-03 — End: 2018-09-01
  Filled 2017-10-15: qty 1, 56d supply, fill #0

## 2017-10-03 MED ORDER — EMPTY CONTAINER
PRN refills | 0 days | Status: CP
Start: 2017-10-03 — End: 2018-01-13

## 2017-10-03 NOTE — Unmapped (Signed)
Per test claim for STELARA 90 MG/ML SYRINGE at the [facility], patient needs Medication Assistance Program for Prior Authorization.

## 2017-10-03 NOTE — Unmapped (Signed)
Per Dr. Elizebeth Brooking, pt is ok to start receiving stelara at home through self injections. Stelara 90mg /mL sent to [facility] pt due on 9/24

## 2017-10-08 NOTE — Unmapped (Signed)
[facility] Specialty Medication Referral: PA APPROVED    Medication (Brand/Generic): STELARA    Initial Test Claim completed with resulted information below:  No PA required  Patient ABLE to fill at [facility]  Insurance Company:  [employer]  Anticipated Copay: $3.80  Is anticipated copay with a copay card or grant? NO    As Co-pay is under $100 defined limit, per policy there will be no further investigation of need for financial assistance at this time unless patient requests. This referral has been communicated to the provider and handed off to the [facility] team for further processing and filling of prescribed medication.   ______________________________________________________________________  Please utilize this referral for viewing purposes as it will serve as the central location for all relevant documentation and updates.

## 2017-10-08 NOTE — Unmapped (Signed)
Spoke with pt today regarding Stelara - he understands he will no longer be coming to [facility] for his injections as he discussed with Dr. Elizebeth Brooking. He would prefer to do self injections at home and Dr. Elizebeth Brooking agreed to this.   Pt provided with [facility] phone number, instructed to call them today to set up delivery as he is due for stelara on 9/24. Our app cancelled. Pt agrees to this plan and verbalized he would call pharmacy today.

## 2017-10-09 NOTE — Unmapped (Signed)
[facility]   Patient Onboarding/Medication Counseling    Mr.Delano is a 47 y.o. male with Crohn's Disease who I am counseling today on initiation of therapy.    Medication: Stelara 90mg /ml    Verified patient's date of birth / HIPAA.      Education Provided: ?    Dose/Administration discussed: Inject the contents of 1 syringe under the skin every 8 weeks as directed. This medication should be taken  without regard to food.  Stressed the importance of taking medication as prescribed and to contact provider if that changes at any time.  Discussed missed dose instructions.    Storage requirements: this medicine should be stored in the refrigerator.     Side effects / precautions discussed: Mr. Sebesta declined this counseling as he is currently on this medication.    Handling precautions / disposal reviewed:  Patient will dispose of needles in a sharps container or empty laundry detergent bottle.    Drug Interactions: other medications reviewed and up to date in Epic.  No drug interactions identified.    Comorbidities/Allergies: reviewed and up to date in Epic.    Verified therapy is appropriate and should continue      Delivery Information    Medication Assistance provided: Prior Authorization    Anticipated copay of $3.80 reviewed with patient. Verified delivery address in Epic.    Scheduled delivery date: 10/15/17    Explained that medication will be delivered by courier. and this shipment will not require a signature.      Explained the services we provide at [facility] and that each month we would call to set up refills.  Stressed importance of returning phone calls so that we could ensure they receive their medications in time each month.  Informed patient that we should be setting up refills 7-10 days prior to when they will run out of medication.  Informed patient that welcome packet will be sent.      Patient verbalized understanding of the above information as well as how to contact the pharmacy at [phone] option 4 with any questions/concerns.  The pharmacy is open Monday through Friday 8:30am-4:30pm.  A pharmacist is available 24/7 via pager to answer any clinical questions they may have.        Patient Specific Needs      ? Patient has no physical, cognitive, or cultural barriers.    ? Patient prefers to have medications discussed with  Patient     ? Patient is able to read and understand education materials at a high school level or above.    ? Patient's primary language is  English           Lupita Shutter  [facility] Specialty Pharmacist

## 2017-10-14 MED ORDER — DRONABINOL 10 MG CAPSULE
ORAL_CAPSULE | Freq: Two times a day (BID) | ORAL | 2 refills | 0.00000 days | Status: CP
Start: 2017-10-14 — End: 2018-02-03

## 2017-10-14 NOTE — Unmapped (Addendum)
I refilled Marinol with increased dose, we had misunderstood each other at clinic. I did say, however, this is the highest allowable dose and if he runs out early I cannot refill. He expressed understanding.

## 2017-10-15 MED FILL — EMPTY CONTAINER: 30 days supply | Qty: 1 | Fill #0

## 2017-10-15 MED FILL — EMPTY CONTAINER: 30 days supply | Qty: 1 | Fill #0 | Status: AC

## 2017-10-15 MED FILL — STELARA 90 MG/ML SUBCUTANEOUS SYRINGE: 56 days supply | Qty: 1 | Fill #0 | Status: AC

## 2017-10-20 ENCOUNTER — Encounter: Payer: Self-pay | Admitting: Anesthesiology

## 2017-10-20 ENCOUNTER — Other Ambulatory Visit: Payer: Self-pay

## 2017-10-20 ENCOUNTER — Ambulatory Visit
Payer: Medicare Other | Attending: Student in an Organized Health Care Education/Training Program | Admitting: Anesthesiology

## 2017-10-20 VITALS — BP 121/96 | HR 72 | Temp 98.2°F | Resp 18 | Ht 71.0 in | Wt 135.0 lb

## 2017-10-20 DIAGNOSIS — F1190 Opioid use, unspecified, uncomplicated: Secondary | ICD-10-CM

## 2017-10-20 DIAGNOSIS — M47816 Spondylosis without myelopathy or radiculopathy, lumbar region: Secondary | ICD-10-CM

## 2017-10-20 DIAGNOSIS — M545 Low back pain: Secondary | ICD-10-CM | POA: Diagnosis not present

## 2017-10-20 DIAGNOSIS — M542 Cervicalgia: Secondary | ICD-10-CM | POA: Diagnosis present

## 2017-10-20 DIAGNOSIS — M549 Dorsalgia, unspecified: Secondary | ICD-10-CM

## 2017-10-20 DIAGNOSIS — G894 Chronic pain syndrome: Secondary | ICD-10-CM | POA: Diagnosis not present

## 2017-10-20 DIAGNOSIS — Z79891 Long term (current) use of opiate analgesic: Secondary | ICD-10-CM | POA: Diagnosis not present

## 2017-10-20 DIAGNOSIS — K5090 Crohn's disease, unspecified, without complications: Secondary | ICD-10-CM | POA: Diagnosis not present

## 2017-10-20 DIAGNOSIS — R109 Unspecified abdominal pain: Secondary | ICD-10-CM | POA: Diagnosis not present

## 2017-10-20 DIAGNOSIS — Z79899 Other long term (current) drug therapy: Secondary | ICD-10-CM | POA: Diagnosis not present

## 2017-10-20 DIAGNOSIS — G8929 Other chronic pain: Secondary | ICD-10-CM | POA: Diagnosis present

## 2017-10-20 MED ORDER — OXYCODONE-ACETAMINOPHEN 10-325 MG PO TABS: 1.0000 | ORAL_TABLET | Freq: Three times a day (TID) | ORAL | 0 refills | Status: DC

## 2017-10-20 NOTE — Progress Notes (Signed)
Nursing Pain Medication Assessment:  Safety precautions to be maintained throughout the outpatient stay will include: orient to surroundings, keep bed in low position, maintain call bell within reach at all times, provide assistance with transfer out of bed and ambulation.  Medication Inspection Compliance: Pill count conducted under aseptic conditions, in front of the patient. Neither the pills nor the bottle was removed from the patient's sight at any time. Once count was completed pills were immediately returned to the patient in their original bottle.  Medication: Oxycodone/APAP Pill/Patch Count: 0 of 75 pills remain Pill/Patch Appearance: Markings consistent with prescribed medication Bottle Appearance: Standard pharmacy container. Clearly labeled. Filled Date: 08/21 / 2018 Last Medication intake:  Today

## 2017-10-20 NOTE — Progress Notes (Signed)
Subjective:  Patient ID: Benjamin Murillo, male    DOB: Jun 21, 1970  Age: 47 y.o. MRN: [id number]  CC: Neck Pain and Abdominal Pain   Procedure: None  HPI Benjamin Murillo presents for reevaluation.  His last seen 2 months ago and is still doing well with his medications for his abdominal pain.  The quality characteristic distribution of his abdominal pain are stable in nature.  It is settled down somewhat over the past 3 to 4 months.  He also is having better relief with his low back pain and cervical neck pain.  Otherwise he is in his usual state of health.  No new changes are reported in the pain syndrome that he has.  I have reviewed the [facility] practitioner database information and it is appropriate.  I also reviewed his opioid assessment sheet and he continues to derive good functional lifestyle improvement with his medications with no untoward or side effects reported.  Outpatient Medications Prior to Visit  Medication Sig Dispense Refill  . amlodipine-atorvastatin (CADUET) 10-10 MG tablet Take 1 tablet by mouth daily.    Marland Kitchen dronabinol (MARINOL) 10 MG capsule Take 10 mg by mouth 2 (two) times daily before a meal.    . famotidine (PEPCID) 20 MG tablet Take 20 mg by mouth 2 (two) times daily.    . hyoscyamine (LEVSIN, ANASPAZ) 0.125 MG tablet Take 1 tablet by mouth as needed.    . IRON PO Take by mouth.    . magic mouthwash w/lidocaine SOLN Take 5 mLs by mouth 4 (four) times daily. 100 mL 0  . naloxone (NARCAN) nasal spray 4 mg/0.1 mL For respiratory depression from opioids 1 kit 2  . oxyCODONE-acetaminophen (PERCOCET) 10-325 MG tablet Take 1 tablet by mouth 3 (three) times daily. 75 tablet 0  . gabapentin (NEURONTIN) 100 MG capsule Take 1 capsule (100 mg total) by mouth at bedtime. 30 capsule 1  . gentamicin (GARAMYCIN) 0.3 % ophthalmic solution Place 1 drop into the right eye every 4 (four) hours. (Patient not taking: Reported on 08/19/2017) 5 mL 0   No facility-administered  medications prior to visit.     Review of Systems CNS: No confusion or sedation Cardiac: No angina or palpitations GI: No abdominal pain or constipation Constitutional: No nausea vomiting fevers or chills  Objective:  BP (!) 121/96   Pulse 72   Temp 98.2 F (36.8 C) (Oral)   Resp 18   Ht _0  (1.803 m)   Wt 135 lb (61.2 kg)   SpO2 100%   BMI 18.83 kg/m    BP Readings from Last 3 Encounters:  10/20/17 (!) 121/96  08/19/17 (!) 138/97  08/11/17 (!) 152/89     Wt Readings from Last 3 Encounters:  10/20/17 135 lb (61.2 kg)  08/19/17 135 lb (61.2 kg)  08/11/17 130 lb (59 kg)     Physical Exam Pt is alert and oriented PERRL EOMI HEART IS RRR no murmur or rub LCTA no wheezing or rales MUSCULOSKELETAL without change  Labs  No results found for: HGBA1C Lab Results  Component Value Date   CREATININE 0.94 10/13/2015    -------------------------------------------------------------------------------------------------------------------- Lab Results  Component Value Date   WBC 11.1 (H) 10/13/2015   HGB 12.0 (L) 10/13/2015   HCT 34.1 (L) 10/13/2015   PLT 226 10/13/2015   GLUCOSE 114 (H) 10/13/2015   ALT 15 (L) 10/11/2015   AST 21 10/11/2015   NA 133 (L) 10/13/2015   K 4.0 10/13/2015  CL 104 10/13/2015   CREATININE 0.94 10/13/2015   BUN 11 10/13/2015   CO2 26 10/13/2015    --------------------------------------------------------------------------------------------------------------------- No results found.   Assessment & Plan:   Benjamin Murillo was seen today for neck pain and abdominal pain.  Diagnoses and all orders for this visit:  Chronic abdominal pain  Chronic pain syndrome -     ToxASSURE Select 53 (MW), Urine  Crohn's disease without complication, unspecified gastrointestinal tract location (HCC)  Chronic, continuous use of opioids -     ToxASSURE Select 13 (MW), Urine  Cervicalgia  Facet arthritis of lumbar region  Chronic bilateral low  back pain without sciatica  Chronic neck and back pain  Other orders -     Discontinue: oxyCODONE-acetaminophen (PERCOCET) 10-325 MG tablet; Take 1 tablet by mouth 3 (three) times daily. -     oxyCODONE-acetaminophen (PERCOCET) 10-325 MG tablet; Take 1 tablet by mouth 3 (three) times daily.        ----------------------------------------------------------------------------------------------------------------------  Problem List Items Addressed This Visit      Unprioritized   Chronic abdominal pain - Primary   Relevant Medications   oxyCODONE-acetaminophen (PERCOCET) 10-325 MG tablet   Chronic pain syndrome (Chronic)   Relevant Orders   ToxASSURE Select 13 (MW), Urine   Crohn disease ([place])    Other Visit Diagnoses    Chronic, continuous use of opioids       Relevant Orders   ToxASSURE Select 13 (MW), Urine   Cervicalgia       Facet arthritis of lumbar region       Relevant Medications   oxyCODONE-acetaminophen (PERCOCET) 10-325 MG tablet   Chronic bilateral low back pain without sciatica       Relevant Medications   oxyCODONE-acetaminophen (PERCOCET) 10-325 MG tablet   Chronic neck and back pain       Relevant Medications   oxyCODONE-acetaminophen (PERCOCET) 10-325 MG tablet        ----------------------------------------------------------------------------------------------------------------------  1. Chronic abdominal pain Continue follow-up with his GI doctors  2. Chronic pain syndrome We have reviewed the [place] information is appropriate.  We will give him refills on his medications for September 23 and all October 23.  He is scheduled for return to clinic in 2 months. - ToxASSURE Select 13 (MW), Urine  3. Crohn's disease without complication, unspecified gastrointestinal tract location [facility]) Is receiving Marinol from his GI doctors.  He does have a UDS that was positive for THC.  4. Chronic, continuous use of opioids As above and he voices  compliance with her regimen.  He denies any illicit or diverting use. - ToxASSURE Select 13 (MW), Urine  5. Cervicalgia We talked about some core stretches and strengthening exercises for his neck and shoulders today  6. Facet arthritis of lumbar region As above  7. Chronic bilateral low back pain without sciatica As above  8. Chronic neck and back pain Follow-up in 2 months    ----------------------------------------------------------------------------------------------------------------------  I am having Grace Bushy. Heidel maintain his amlodipine-atorvastatin, dronabinol, naloxone, IRON PO, gabapentin, magic mouthwash w/lidocaine, hyoscyamine, famotidine, gentamicin, and oxyCODONE-acetaminophen.   Meds ordered this encounter  Medications  . DISCONTD: oxyCODONE-acetaminophen (PERCOCET) 10-325 MG tablet    Sig: Take 1 tablet by mouth 3 (three) times daily.    Dispense:  75 tablet    Refill:  0    Do not fill until [id number]  . oxyCODONE-acetaminophen (PERCOCET) 10-325 MG tablet    Sig: Take 1 tablet by mouth 3 (three) times daily.    Dispense:  75 tablet    Refill:  0    Do not fill until [id number]   Patient's Medications  New Prescriptions   No medications on file  Previous Medications   AMLODIPINE-ATORVASTATIN (CADUET) 10-10 MG TABLET    Take 1 tablet by mouth daily.   DRONABINOL (MARINOL) 10 MG CAPSULE    Take 10 mg by mouth 2 (two) times daily before a meal.   FAMOTIDINE (PEPCID) 20 MG TABLET    Take 20 mg by mouth 2 (two) times daily.   GABAPENTIN (NEURONTIN) 100 MG CAPSULE    Take 1 capsule (100 mg total) by mouth at bedtime.   GENTAMICIN (GARAMYCIN) 0.3 % OPHTHALMIC SOLUTION    Place 1 drop into the right eye every 4 (four) hours.   HYOSCYAMINE (LEVSIN, ANASPAZ) 0.125 MG TABLET    Take 1 tablet by mouth as needed.   IRON PO    Take by mouth.   MAGIC MOUTHWASH W/LIDOCAINE SOLN    Take 5 mLs by mouth 4 (four) times daily.   NALOXONE (NARCAN) NASAL SPRAY 4 MG/0.1 ML     For respiratory depression from opioids  Modified Medications   Modified Medication Previous Medication   OXYCODONE-ACETAMINOPHEN (PERCOCET) 10-325 MG TABLET oxyCODONE-acetaminophen (PERCOCET) 10-325 MG tablet      Take 1 tablet by mouth 3 (three) times daily.    Take 1 tablet by mouth 3 (three) times daily.  Discontinued Medications   No medications on file   ----------------------------------------------------------------------------------------------------------------------  Follow-up: Return in about 2 months (around 12/20/2017) for evaluation, med refill.    [name], MD

## 2017-10-24 LAB — TOXASSURE SELECT 13 (MW), URINE

## 2017-11-30 ENCOUNTER — Ambulatory Visit
Admit: 2017-11-30 | Discharge: 2017-11-30 | Disposition: A | Discharge status: Home / Self Care | Payer: MEDICARE | Attending: Emergency Medicine

## 2017-11-30 LAB — EGFR CKD-EPI NON-AA MALE: Lab: >90

## 2017-11-30 LAB — CBC W/ AUTO DIFF
BASOPHILS ABSOLUTE COUNT: 0 10*9/L (ref 0.0–0.1)
BASOPHILS RELATIVE PERCENT: 0.5 %
EOSINOPHILS RELATIVE PERCENT: 0.8 %
HEMATOCRIT: 40.4 % — ABNORMAL LOW (ref 41.0–53.0)
HEMOGLOBIN: 13.4 g/dL — ABNORMAL LOW (ref 13.5–17.5)
LYMPHOCYTES ABSOLUTE COUNT: 1.6 10*9/L (ref 1.5–5.0)
LYMPHOCYTES RELATIVE PERCENT: 22.2 %
MEAN CORPUSCULAR HEMOGLOBIN CONC: 33.1 g/dL (ref 31.0–37.0)
MEAN CORPUSCULAR HEMOGLOBIN: 32 pg (ref 26.0–34.0)
MEAN CORPUSCULAR VOLUME: 96.7 fL (ref 80.0–100.0)
MEAN PLATELET VOLUME: 7.8 fL (ref 7.0–10.0)
MONOCYTES RELATIVE PERCENT: 4.8 %
NEUTROPHILS ABSOLUTE COUNT: 4.9 10*9/L (ref 2.0–7.5)
NEUTROPHILS RELATIVE PERCENT: 70 %
RED BLOOD CELL COUNT: 4.17 10*12/L — ABNORMAL LOW (ref 4.50–5.90)
RED CELL DISTRIBUTION WIDTH: 13.1 % (ref 12.0–15.0)
WBC ADJUSTED: 7.1 10*9/L (ref 4.5–11.0)

## 2017-11-30 LAB — COMPREHENSIVE METABOLIC PANEL
ALBUMIN: 4.6 g/dL (ref 3.5–5.0)
ALKALINE PHOSPHATASE: 74 U/L (ref 38–126)
ALT (SGPT): 13 U/L (ref <50)
AST (SGOT): 22 U/L (ref 19–55)
BILIRUBIN TOTAL: 0.6 mg/dL (ref 0.0–1.2)
BLOOD UREA NITROGEN: 9 mg/dL (ref 7–21)
BUN / CREAT RATIO: 10
CALCIUM: 9.6 mg/dL (ref 8.5–10.2)
CHLORIDE: 100 mmol/L (ref 98–107)
CO2: 22 mmol/L (ref 22.0–30.0)
CREATININE: 0.92 mg/dL (ref 0.70–1.30)
EGFR CKD-EPI NON-AA MALE: >90 mL/min/{1.73_m2} (ref >=60)
GLUCOSE RANDOM: 129 mg/dL (ref 65–179)
POTASSIUM: 4.2 mmol/L (ref 3.5–5.0)
PROTEIN TOTAL: 7.7 g/dL (ref 6.5–8.3)
SODIUM: 135 mmol/L (ref 135–145)

## 2017-11-30 LAB — URINALYSIS WITH CULTURE REFLEX
BACTERIA: NONE SEEN /HPF
BILIRUBIN UA: NEGATIVE
GLUCOSE UA: NEGATIVE
KETONES UA: NEGATIVE
LEUKOCYTE ESTERASE UA: NEGATIVE
NITRITE UA: NEGATIVE
PH UA: 6.5 (ref 5.0–9.0)
PROTEIN UA: NEGATIVE
RBC UA: <1 /HPF (ref <=3)
SPECIFIC GRAVITY UA: 1.001 — ABNORMAL LOW (ref 1.003–1.030)
SQUAMOUS EPITHELIAL: <1 /HPF (ref 0–5)

## 2017-11-30 LAB — BASOPHILS RELATIVE PERCENT: Lab: 0.5

## 2017-11-30 LAB — LIPASE: Triacylglycerol lipase:CCnc:Pt:Ser/Plas:Qn:: 147

## 2017-11-30 LAB — COLOR

## 2017-11-30 MED ORDER — PROMETHAZINE 12.5 MG TABLET
ORAL_TABLET | Freq: Four times a day (QID) | ORAL | 3 refills | 0 days | Status: SS | PRN
Start: 2017-11-30 — End: 2017-12-21

## 2017-11-30 MED ORDER — PROMETHAZINE 25 MG TABLET
ORAL_TABLET | Freq: Four times a day (QID) | ORAL | 0 refills | 0.00000 days | Status: SS | PRN
Start: 2017-11-30 — End: 2017-12-21

## 2017-11-30 NOTE — Unmapped (Signed)
Pt states has hx of crohns, having abdominal pain, N/V for past week. Denies blood in vomit.

## 2017-11-30 NOTE — Unmapped (Signed)
Patient rounds completed. The following patient needs were addressed:  Pain, Toileting, Personal Belongings, Plan of Care, Call Bell in Reach and Bed Position Low .

## 2017-11-30 NOTE — Unmapped (Signed)
Blanket provided for patient comfort.

## 2017-11-30 NOTE — Unmapped (Signed)
[facility] Emergency Department Provider Note    ED Clinical Impression     Final diagnoses:   Crohn's disease of small intestine with other complication (CMS-HCC) (Primary)       Initial Impression, ED Course, Assessment and Plan     47 y.o. male with PMH of Crohn's disease, GERD, and HTN who presents with c/o worsening abdominal pain, nausea, vomiting, and diarrhea for the past week after eating at a restaurant.    On exam the patient is alert, oriented, uncomfortable appearing due to pain. VS are WNL.    Patient improved in the emergency department symptomatic treatment.  He wants to try taking p.o. and if this is successful he will be discharged.  I discussed the case with the on-call GI consultant who will arrange close follow-up with the patient if he is discharged.  If he fails a p.o. trial we will admit him for ongoing care of his nausea vomiting and Crohn's disease.    He was able to eat and drink. Will /dc.    History     Chief Complaint  Abdominal Pain      HPI   Demaurion Dicioccio is a 47 y.o. male with PMH of Crohn's disease, GERD, and HTN who presents with c/o worsening abdominal pain, nausea, vomiting, and diarrhea for the past week after eating at a restaurant. He reports that his symptoms feel similar to his Crohn's flare symptoms, however, he is currently feeling more nauseated than normal. Since 800 this morning he has vomited approximately 4-5 times. He has also had several episodes of diarrhea with dark stools. Non-bloody vomitus and stool. He reports feeling a burning chest pain that he attributes to reflux. Since the last meal prior to onset of his symptoms, he has had decreased PO intake, a couple of bites here and there as he cannot keep anything down to his vomiting. He denies fevers but has been having chills.     In regards to medications, he take Stelara every 8 weeks. Last dose 11/20/17. He has tried taking Phenergan for his nausea with no relief of his symptoms.     Per chart review from GI consult visit on 09/12/17:      Crohn's disease history:  1. Stricturing ileocolonic Crohn's disease diagnosed 1990  2. Status post ileocecal resection 1998 due to stricture, with segmental ileocolectomy 2009 due to severe recurrent stricturing Crohn's disease.  3. On Remicade for a short time in the past, however discontinued due to noncompliance and transportation problems.  4. Previous he followed by Dr. Gwenith Spitz here at [facility] but terminated due to repeated no-shows and medical noncompliance  5. Previously on 6-MP, however this medication was discontinued after patient had undetectable drug levels on a recent admission  6. Recalls being on Humira at one point, but unclear exactly when.  7. He had additional ileocolonic resection by Koruda 09/16/2016, doing clinically much better for about six months after that, started on Stelara getting injections on site to insure adherence.  8. Stricturing ileocolonic Crohn's disease diagnosed 1990  9. Status post ileocecal resection 1998 due to stricture, with segmental ileocolectomy 2009 due to severe recurrent stricturing Crohn's disease.  10. On Remicade for a short time in the past, however discontinued due to noncompliance and transportation problems.  11. Previous he followed by Dr. Gwenith Spitz here at [facility] but terminated due to repeated no-shows and medical noncompliance  12. Previously on 6-MP, however this medication was discontinued after patient had undetectable drug  levels on a recent admission  13. Recalls being on Humira at one point, but unclear exactly when.  14. He had additional ileocolonic resection by Koruda 09/16/2016, doing clinically much better for about six months after that, started on Stelara getting injections on site to insure adherence.  Symptoms improved and gained weight after surgery, did very well for several months, then developed nausea and vomiting and recurrent chronic abdominal pain. Imaging and labs were reassuring against flare and colonoscopy was without active disease.     Past Medical History:   Diagnosis Date   ??? Crohn's disease (CMS-HCC)     diagnosed in 1990   ??? GERD (gastroesophageal reflux disease)    ??? Hypertension 10/08/2016       Past Surgical History:   Procedure Laterality Date   ??? COLON SURGERY     ??? PR COLONOSCOPY FLX DX W/COLLJ SPEC WHEN PFRMD Left 11/10/2012    Procedure: COLONOSCOPY, FLEXIBLE, PROXIMAL TO SPLENIC FLEXURE; DIAGNOSTIC, W/WO COLLECTION SPECIMEN BY BRUSH OR WASH;  Surgeon: Malcolm Metro, MD;  [facility]: [facility];  Service: Gastroenterology   ??? PR COLONOSCOPY FLX DX W/COLLJ SPEC WHEN PFRMD N/A 07/21/2013    Procedure: COLONOSCOPY, FLEXIBLE, PROXIMAL TO SPLENIC FLEXURE; DIAGNOSTIC, W/WO COLLECTION SPECIMEN BY BRUSH OR WASH;  Surgeon: Gwen Pounds, MD;  Location: GI [facility]URES [facility];  Service: Gastroenterology   ??? PR COLONOSCOPY FLX DX W/COLLJ SPEC WHEN PFRMD N/A 08/09/2016    Procedure: COLONOSCOPY, FLEXIBLE, PROXIMAL TO SPLENIC FLEXURE; DIAGNOSTIC, W/WO COLLECTION SPECIMEN BY BRUSH OR WASH;  Surgeon: Janyth Pupa, MD;  Location: [facility]URES [facility];  Service: Gastroenterology   ??? PR COLONOSCOPY W/BIOPSY SINGLE/MULTIPLE  07/23/2012    Procedure: COLONOSCOPY, FLEXIBLE, PROXIMAL TO SPLENIC FLEXURE; WITH BIOPSY, SINGLE OR MULTIPLE;  Surgeon: Vickii Chafe, MD;  Location: GI [facility]URES [facility];  Service: Gastroenterology   ??? PR COLONOSCOPY W/BIOPSY SINGLE/MULTIPLE N/A 07/15/2014    Procedure: COLONOSCOPY, FLEXIBLE, PROXIMAL TO SPLENIC FLEXURE; WITH BIOPSY, SINGLE OR MULTIPLE;  Surgeon: Janyth Pupa, MD;  Location: GI [facility]URES [facility];  Service: Gastroenterology   ??? PR COLONOSCOPY W/BIOPSY SINGLE/MULTIPLE N/A 03/21/2017    Procedure: COLONOSCOPY, FLEXIBLE, PROXIMAL TO SPLENIC FLEXURE; WITH BIOPSY, SINGLE OR MULTIPLE;  Surgeon: Modena Nunnery, MD;  Location: GI PROCEDURES [facility] [facility];  Service: Gastroenterology   ??? PR COLSC FLEXIBLE W/TRANSENDOSCOPIC BALLOON DILAT N/A 07/15/2014    Procedure: COLONOSCOPY, FLEXIBLE; WITH DILATION BY BALLOON, 1 OR MORE STRICTURES;  Surgeon: Janyth Pupa, MD;  Location: [facility];  Service: Gastroenterology   ??? PR REMVL COLON & TERM ILEUM W/ILEOCOLOSTOMY N/A 09/16/2016    Procedure: COLECTOMY, PARTIAL, WITH REMOVAL OF TERMINAL ILEUM WITH ILEOCOLOSTOMY;  Surgeon: Lady Gary, MD;  Location: MAIN OR [place];  Service: Gastrointestinal   ??? PR UPPER GI ENDOSCOPY,BIOPSY N/A 07/23/2012    Procedure: UGI ENDOSCOPY; WITH BIOPSY, SINGLE OR MULTIPLE;  Surgeon: Vickii Chafe, MD;  [facility];  Service: Gastroenterology   ??? PR UPPER GI ENDOSCOPY,DIAGNOSIS N/A 11/10/2012    Procedure: UGI ENDO, INCLUDE ESOPHAGUS, STOMACH, & DUODENUM &/OR JEJUNUM; DX W/WO COLLECTION SPECIMN, BY BRUSH OR WASH;  Surgeon: Malcolm Metro, MD;  Location: [facility];  Service: Gastroenterology   ??? PR UPPER GI ENDOSCOPY,DIAGNOSIS N/A 07/21/2013    Procedure: UGI ENDO, INCLUDE ESOPHAGUS, STOMACH, & DUODENUM &/OR JEJUNUM; DX W/WO COLLECTION SPECIMN, BY BRUSH OR WASH;  Surgeon: Gwen Pounds, MD;  Location: GI PROCEDURES [facility] [place];  Service: Gastroenterology   ??? PR UPPER GI ENDOSCOPY,DIAGNOSIS N/A 07/15/2014    Procedure: UGI ENDO, INCLUDE ESOPHAGUS, STOMACH, & DUODENUM &/OR JEJUNUM; DX W/WO COLLECTION SPECIMN, BY BRUSH OR WASH;  Surgeon: Janyth Pupa, MD;  Location: [facility];  Service: Gastroenterology       No current facility-administered medications for this encounter.     Current Outpatient Medications:   ???  cholestyramine (QUESTRAN) 4 gram packet, Take 1 packet by mouth Three (3) times a day with a meal. 1 packet by mouth 3x daily before meals. Don't take within 2hrs of other meds, Disp: 60 each, Rfl: 2  ???  desipramine (NORPRAMIN) 50 MG tablet, Take 1 tablet (50 mg total) by mouth daily., Disp: 30 tablet, Rfl: 2  ???  dronabinol (MARINOL) 10 MG capsule, Take 1 capsule (10 mg total) by mouth Two (2) times a day (30 minutes before a meal)., Disp: 60 capsule, Rfl: 2  ???  empty container Misc, Use as directed, Disp: 1 each, Rfl: PRN  ???  famotidine (PEPCID) 20 MG tablet, Take 20 mg by mouth two (2) times a day as needed for heartburn., Disp: , Rfl:   ???  gabapentin (NEURONTIN) 300 MG capsule, Start with 300-300-600 x 1 week, then 600-300-600 x 1 week, then 600-600-600 if well tolerated, Disp: 180 capsule, Rfl: 1  ???  hyoscyamine (ANASPAZ,LEVSIN) 0.125 mg tablet, TAKE 1 TABLET BY MOUTH EVERY 4 HOURS AS NEEDED FOR CRAMPING, Disp: 30 tablet, Rfl: 11  ???  multivitamins, therapeutic with minerals tablet, Take 1 tablet by mouth daily., Disp: 30 tablet, Rfl: 1  ???  oxyCODONE-acetaminophen (PERCOCET) 10-325 mg per tablet, Take 1 tablet by mouth every eight (8) hours as needed for pain. , Disp: , Rfl:   ???  promethazine (PHENERGAN) 12.5 MG tablet, Take 2 tablets (25 mg total) by mouth every six (6) hours as needed for nausea., Disp: 15 tablet, Rfl: 3  ???  promethazine (PHENERGAN) 25 MG tablet, Take 1 tablet (25 mg total) by mouth every six (6) hours as needed for nausea. for up to 7 days, Disp: 30 tablet, Rfl: 0  ???  sildenafil (VIAGRA) 50 MG tablet, Take 1 tablet (50 mg total) by mouth daily as needed for erectile dysfunction., Disp: 10 tablet, Rfl: 11  ???  ustekinumab (STELARA) 90 mg/mL Syrg syringe, Inject 1 mL (90 mg total) under the skin every 8 weeks., Disp: 1 mL, Rfl: 5    Allergies  Vicodin [hydrocodone-acetaminophen]; Tramadol; Dilaudid [hydromorphone]; Morphine; and Opioids - morphine analogues    Family History   Problem Relation Age of Onset   ??? Hyperlipidemia Father    ??? Cancer Maternal Aunt    ??? No Known Problems Mother    ??? No Known Problems Sister    ??? No Known Problems Brother    ??? No Known Problems Maternal Uncle    ??? No Known Problems Paternal Aunt    ??? No Known Problems Paternal Uncle    ??? No Known Problems Maternal Grandmother    ??? No Known Problems Maternal Grandfather    ??? No Known Problems Paternal Grandmother    ??? No Known Problems Paternal Grandfather    ??? Anesthesia problems Neg Hx    ??? Broken bones Neg Hx    ??? Clotting disorder Neg Hx    ??? Collagen disease Neg Hx    ??? Diabetes Neg Hx    ??? Dislocations Neg Hx    ??? Fibromyalgia Neg Hx    ???  Gout Neg Hx    ??? Hemophilia Neg Hx    ??? Osteoporosis Neg Hx    ??? Rheumatologic disease Neg Hx    ??? Scoliosis Neg Hx    ??? Severe sprains Neg Hx    ??? Sickle cell anemia Neg Hx    ??? Spinal Compression Fracture Neg Hx        Social History  Social History     Tobacco Use   ??? Smoking status: Former Smoker     Packs/day: 1.00     Years: 18.00     Pack years: 18.00     Types: Cigarettes     Start date: 08/27/2003     Last attempt to quit: 06/06/2016     Years since quitting: 1.4   ??? Smokeless tobacco: Never Used   ??? Tobacco comment: Pt smokes 1ppd, Pt is interested in tobacco cessation    Substance Use Topics   ??? Alcohol use: No   ??? Drug use: Yes     Types: Marijuana       Review of Systems  Constitutional: Negative for fever.  Eyes: Negative for visual changes.  ENT: Negative for sore throat.  Cardiovascular: Negative for chest pain.  Respiratory: Negative for shortness of breath.  Gastrointestinal: + abdominal pain, nausea, vomiting, and diarrhea. Negative for blood in vomit or stool.   Genitourinary: Negative for dysuria.  Musculoskeletal: Negative for back pain.  Skin: Negative for rash.  Neurological: Negative for headaches, weakness or numbness.      Physical Exam     VITAL SIGNS:    ED Triage Vitals [11/30/17 1128]   Enc Vitals Group      BP 122/77      Heart Rate 70      SpO2 Pulse       Resp 20      Temp 36.4 ??C (97.5 ??F)      Temp Source Oral      SpO2 99 %       Constitutional: Alert and oriented. Uncomfortable appearing due to pain.   Eyes: Conjunctivae are normal.  ENT       Head: Normocephalic and atraumatic.       Nose: No congestion.       Mouth/Throat: Mucous membranes are moist.       Neck: No stridor. Hematological/Lymphatic/Immunilogical: No cervical lymphadenopathy.  Cardiovascular: Normal rate, regular rhythm. Normal and symmetric distal pulses are present in all extremities.  Respiratory: Normal respiratory effort. Breath sounds are normal.  Gastrointestinal: mild epigastric tenderness.  Musculoskeletal: Nontender with normal range of motion in all extremities.       Right lower leg: No tenderness or edema.       Left lower leg: No tenderness or edema.  Neurologic: Normal speech and language. No gross focal neurologic deficits are appreciated.  Skin: Skin is warm, dry and intact. No rash noted.  Psychiatric: Mood and affect are normal. Speech and behavior are normal.    Pertinent labs & imaging results that were available during my care of the patient were reviewed by me and considered in my medical decision making (see chart for details).    Documentation assistance was provided by Nechama Guard, Scribe, on November 30, 2017 at 11:52 AM for Doylene Canning, MD.  December 02, 2017 9:32 AM. Documentation assistance provided by the scribe. I was present during the time the encounter was recorded. The information recorded by the scribe was done at my direction and has been reviewed and  validated by me.        Allie Bossier, MD  12/02/17 [phone]

## 2017-11-30 NOTE — Unmapped (Signed)
Head of bed elevated, call light/ tv remote control within reach, patient belongings at bedside, patient sitting up in bed resting comfortably watching tv

## 2017-11-30 NOTE — Unmapped (Signed)
Patient rounds completed. Pt is resting in stretcher in no apparent distress. MD Cyril Mourning is at bedside evaluating pt. No other requests or complaints at this time. Bed in lowest position and locked; call bell and patient belongings within reach.

## 2017-12-01 NOTE — Unmapped (Signed)
ED MD Cyril Mourning ok with patient being discharged and gave recommendation to pt to follow up with primary care provider about elevated blood pressure.  Pt states his blood pressure normally goes up when he is having a Crohns flare.

## 2017-12-01 NOTE — Unmapped (Signed)
Patient rounds completed. The following patient needs were addressed:  Pain, Toileting, Personal Belongings, Plan of Care, Call Bell in Reach and Bed Position Low .

## 2017-12-01 NOTE — Unmapped (Signed)
[facility] Refill and Clinical Coordination Note  Medication(s): Stelara 90 mg/mL    Gaylord Shih Shumway, Raymond: 1970-12-25  Phone: [phone] (home) , Alternate phone contact: N/A  Shipping address: [address]  [place] [address]  Phone or address changes today?: No  All above HIPAA information verified.  Insurance changes? No    Completed refill and clinical call assessment today to schedule patient's medication shipment from the [facility] [phone]).      MEDICATION RECONCILIATION    Confirmed the medication and dosage are correct and have not changed: Yes, regimen is correct and unchanged.    Were there any changes to your medication(s) in the past month:  No, there are no changes reported at this time.    ADHERENCE    Is this medicine transplant or covered by Medicare Part B? No.    Did you miss any doses in the past 4 weeks? No missed doses reported.  Adherence counseling provided? Not needed     SIDE EFFECT MANAGEMENT    Are you tolerating your medication?:  Gianlucca reports tolerating the medication.  Side effect management discussed: None      Therapy is appropriate and should be continued.    Evidence of clinical benefit: See Epic note from 09/12/17      FINANCIAL/SHIPPING    Delivery Scheduled: Yes, Expected medication delivery date: 12/05/17     Medication will be delivered via Same Day Courier to the home address in [place].    Additional medications refilled: No additional medications/refills needed at this time.    The patient will receive a drug information handout for each medication shipped and additional FDA Medication Guides as required.      Deshon did not have any additional questions at this time.    Delivery address confirmed in Epic.     We will follow up with patient monthly for standard refill processing and delivery.      Thank you,  Verdia Kuba   [facility] Specialty Pharmacist

## 2017-12-01 NOTE — Unmapped (Signed)
Meal tray delivered.

## 2017-12-04 NOTE — Unmapped (Signed)
Call to check in post-ED presentation. He continues to have nausea, abdominal pain, nausea, loose stools, all about two weeks. He had a tooth pulled about 3-4 weeks ago, and was treated with clindamycin. He should be tested for C Diff, if positive treat, if negative I would try rifaximin. He will get a sample collected at the clinic.

## 2017-12-05 MED FILL — STELARA 90 MG/ML SUBCUTANEOUS SYRINGE: SUBCUTANEOUS | 56 days supply | Qty: 1 | Fill #1

## 2017-12-05 MED FILL — STELARA 90 MG/ML SUBCUTANEOUS SYRINGE: 56 days supply | Qty: 1 | Fill #1 | Status: AC

## 2017-12-09 ENCOUNTER — Institutional Professional Consult (permissible substitution): Admit: 2017-12-09 | Discharge: 2017-12-11 | Payer: MEDICARE

## 2017-12-09 ENCOUNTER — Ambulatory Visit: Admit: 2017-12-09 | Discharge: 2017-12-11 | Payer: MEDICARE

## 2017-12-09 DIAGNOSIS — A0472 Enterocolitis due to Clostridium difficile, not specified as recurrent: Secondary | ICD-10-CM | POA: Insufficient documentation

## 2017-12-09 DIAGNOSIS — R197 Diarrhea, unspecified: Principal | ICD-10-CM

## 2017-12-09 LAB — CBC W/ AUTO DIFF
BASOPHILS ABSOLUTE COUNT: 0.1 10*9/L (ref 0.0–0.1)
EOSINOPHILS RELATIVE PERCENT: 1.4 %
HEMATOCRIT: 38.6 % — ABNORMAL LOW (ref 41.0–53.0)
HEMOGLOBIN: 13.1 g/dL — ABNORMAL LOW (ref 13.5–17.5)
LARGE UNSTAINED CELLS: 2 % (ref 0–4)
LYMPHOCYTES ABSOLUTE COUNT: 2.6 10*9/L (ref 1.5–5.0)
LYMPHOCYTES RELATIVE PERCENT: 25.9 %
MEAN CORPUSCULAR HEMOGLOBIN CONC: 33.8 g/dL (ref 31.0–37.0)
MEAN CORPUSCULAR HEMOGLOBIN: 32.1 pg (ref 26.0–34.0)
MEAN CORPUSCULAR VOLUME: 95 fL (ref 80.0–100.0)
MEAN PLATELET VOLUME: 7.4 fL (ref 7.0–10.0)
MONOCYTES ABSOLUTE COUNT: 0.6 10*9/L (ref 0.2–0.8)
MONOCYTES RELATIVE PERCENT: 5.5 %
NEUTROPHILS ABSOLUTE COUNT: 6.6 10*9/L (ref 2.0–7.5)
NEUTROPHILS RELATIVE PERCENT: 64.4 %
PLATELET COUNT: 300 10*9/L (ref 150–440)
RED BLOOD CELL COUNT: 4.06 10*12/L — ABNORMAL LOW (ref 4.50–5.90)
RED CELL DISTRIBUTION WIDTH: 13.3 % (ref 12.0–15.0)

## 2017-12-09 LAB — COMPREHENSIVE METABOLIC PANEL
ALBUMIN: 4.6 g/dL (ref 3.5–5.0)
ALKALINE PHOSPHATASE: 99 U/L (ref 38–126)
ALT (SGPT): 27 U/L (ref <50)
ANION GAP: 10 mmol/L (ref 7–15)
AST (SGOT): 36 U/L (ref 19–55)
BILIRUBIN TOTAL: 0.5 mg/dL (ref 0.0–1.2)
BLOOD UREA NITROGEN: 7 mg/dL (ref 7–21)
BUN / CREAT RATIO: 8
CALCIUM: 9.6 mg/dL (ref 8.5–10.2)
CHLORIDE: 100 mmol/L (ref 98–107)
CO2: 23 mmol/L (ref 22.0–30.0)
CREATININE: 0.84 mg/dL (ref 0.70–1.30)
EGFR CKD-EPI NON-AA MALE: >90 mL/min/{1.73_m2} (ref >=60)
GLUCOSE RANDOM: 72 mg/dL (ref 65–179)
POTASSIUM: 4.8 mmol/L (ref 3.5–5.0)
PROTEIN TOTAL: 7.8 g/dL (ref 6.5–8.3)
SODIUM: 133 mmol/L — ABNORMAL LOW (ref 135–145)

## 2017-12-09 LAB — C-REACTIVE PROTEIN: C reactive protein:MCnc:Pt:Ser/Plas:Qn:: <5

## 2017-12-09 LAB — SEDIMENTATION RATE, MANUAL: ERYTHROCYTE SEDIMENTATION RATE: 6 mm/h (ref 0–15)

## 2017-12-09 LAB — EGFR CKD-EPI AA MALE: Lab: >90

## 2017-12-09 LAB — ERYTHROCYTE SEDIMENTATION RATE: Lab: 6

## 2017-12-09 LAB — LACTATE BLOOD VENOUS: Lactate:SCnc:Pt:BldV:Qn:: 2.1 — ABNORMAL HIGH

## 2017-12-09 LAB — MONOCYTES RELATIVE PERCENT: Lab: 5.5

## 2017-12-09 LAB — LIPASE: Triacylglycerol lipase:CCnc:Pt:Ser/Plas:Qn:: 197

## 2017-12-09 NOTE — Unmapped (Signed)
Pt here with ABD pain, N/V, and cramping. Pt states he was previously here with his crohn's.

## 2017-12-09 NOTE — Unmapped (Signed)
Patient rounds completed. The following patient needs were addressed:  Pain, Toileting, Personal Belongings, Plan of Care, Call Bell in Reach and Bed Position Low .

## 2017-12-09 NOTE — Unmapped (Signed)
Patient here for stool drop off. Specimen collected and sent to lab

## 2017-12-09 NOTE — Unmapped (Signed)
Patient stopped at GI clinic asking for a call. I spoke with him. He continues to have diarrhea, abdominal pain, and nausea. He didn't get stool C Diff, which I am very suspicious for given his recent exposure to clindamycin right before symptoms started. If negative I would put in basic labs, cbc, CMP, and CRP. I would not jump to CT scan unless his labs looked worse, given his history of functional GI overlay to his Crohn's disease, though the latter could be at play. He said if things get worse he would go back to the emergency department, which I think is reasonable. If he dose present to ED please page me, I would try to help ED management and can alert the luminal consult team.    Jory Ee  [phone]

## 2017-12-09 NOTE — Unmapped (Signed)
[facility] Emergency Department Provider Note      ED Clinical Impression     Final diagnoses:   Epigastric pain (Primary)   Nausea and vomiting, intractability of vomiting not specified, unspecified vomiting type   Diarrhea, unspecified type       Initial Impression, ED Course, Assessment and Plan   Patient is a 47 y.o. male with a PMH of GERD, HTN, and Crohns presenting to the ED for evaluation of abdominal pain with associated nausea, vomiting, and diarrhea. PE notable for soft and tender to epigastric region. BS active. No guarding or rigidity. No CVA tenderness. VSS.     Concern for Crohn's flare vs. C. Diff colitis vs. Gastroenteritis vs. Less likely appendicitis, gallbladder disease, diverticulitis, pancreatitis, bowel obstruction, cardiac, vascular or genitourinary etiology at this time.Will obtain basic labs including inflammatory markers. Will give IVF.     15:01  Labs resulted, notable for elevated lactate of 2.1, Na of 133, Spoke to GI who will evaluate    15:37  GI evaluated, suspect C.diff colitis vs. Viral gastroenteritis/colitis, recommended observation d/t intolerance to POs and requiring IV medication to manage symptoms, paged MAO    17:00   Patient's presentation and treatment plan discussed and signed out to ED APP.     Labs     Labs Reviewed   COMPREHENSIVE METABOLIC PANEL - Abnormal; Notable for the following components:       Result Value    Sodium 133 (*)     All other components within normal limits   LACTATE, VENOUS, WHOLE BLOOD - Abnormal; Notable for the following components:    Lactate, Venous 2.1 (*)     All other components within normal limits   CBC W/ AUTO DIFF - Abnormal; Notable for the following components:    RBC 4.06 (*)     HGB 13.1 (*)     HCT 38.6 (*)     All other components within normal limits    Narrative:     Please use the Absolute Differential for reference ranges.    LIPASE - Normal   C-REACTIVE PROTEIN - Normal   SEDIMENTATION RATE, MANUAL - Normal   CBC W/ DIFFERENTIAL    Narrative:     The following orders were created for panel order CBC w/ Differential.                  Procedure                               Abnormality         Status                                     ---------                               -----------         ------                                     CBC w/ Differential[[id number]]         Abnormal            Final result  Please view results for these tests on the individual orders.       History     Chief Complaint  Abdominal Pain      HPI   Patient was seen by me at 12:20 PM.    Patient is a 47 y.o. male with a PMH of GERD, HTN, and Crohns presenting to the ED for evaluation of abdominal pain with associated nausea, vomiting, and diarrhea.  Patient reports he has been having these symptoms for approximately 4 weeks.  He describes the pain as a sharp sensation to the upper mid abdomen, constant in nature, and worse after eating.  Patient developed diaphoresis with hot flashes over the last several days.  Reports has been nauseated with vomiting daily.  Patient states diarrhea too many times to count has been ongoing for the last several weeks, described as watery and black with spots of blood.  Patient has been compliant with his home medications without relief. States associated indigestion, increased belching and bloating since onset that has been constant in onset. He has been taking pepcid without relief.    Of note patient has been notifying his GI specialist who advised to come to the emergency department today.  GI specialist questions diagnosis of C. difficile as patient recently was taking clindamycin for tooth abscess.  Consider also functional GI overlay to his Crohn's disease.    Previous chart, nursing notes, and vital signs reviewed.      Pertinent labs & imaging results that were available during my care of the patient were reviewed by me and considered in my medical decision making (see chart for details).    Past Medical History:   Diagnosis Date   ??? Crohn's disease (CMS-HCC)     diagnosed in 1990   ??? GERD (gastroesophageal reflux disease)    ??? Hypertension 10/08/2016       Past Surgical History:   Procedure Laterality Date   ??? COLON SURGERY     ??? PR COLONOSCOPY FLX DX W/COLLJ SPEC WHEN PFRMD Left 11/10/2012    Procedure: COLONOSCOPY, FLEXIBLE, PROXIMAL TO SPLENIC FLEXURE; DIAGNOSTIC, W/WO COLLECTION SPECIMEN BY BRUSH OR WASH;  Surgeon: Malcolm Metro, MD;  Location: [facility];  Service: Gastroenterology   ??? PR COLONOSCOPY FLX DX W/COLLJ SPEC WHEN PFRMD N/A 07/21/2013    Procedure: COLONOSCOPY, FLEXIBLE, PROXIMAL TO SPLENIC FLEXURE; DIAGNOSTIC, W/WO COLLECTION SPECIMEN BY BRUSH OR WASH;  Surgeon: Gwen Pounds, MD;  Location: [facility];  Service: Gastroenterology   ??? PR COLONOSCOPY FLX DX W/COLLJ SPEC WHEN PFRMD N/A 08/09/2016    Procedure: COLONOSCOPY, FLEXIBLE, PROXIMAL TO SPLENIC FLEXURE; DIAGNOSTIC, W/WO COLLECTION SPECIMEN BY BRUSH OR WASH;  Surgeon: Janyth Pupa, MD;  Location: [facility];  Service: Gastroenterology   ??? PR COLONOSCOPY W/BIOPSY SINGLE/MULTIPLE  07/23/2012    Procedure: COLONOSCOPY, FLEXIBLE, PROXIMAL TO SPLENIC FLEXURE; WITH BIOPSY, SINGLE OR MULTIPLE;  Surgeon: Vickii Chafe, MD;  Location: [facility]URES [facility];  Service: Gastroenterology   ??? PR COLONOSCOPY W/BIOPSY SINGLE/MULTIPLE N/A 07/15/2014    Procedure: COLONOSCOPY, FLEXIBLE, PROXIMAL TO SPLENIC FLEXURE; WITH BIOPSY, SINGLE OR MULTIPLE;  Surgeon: Janyth Pupa, MD;  Location: [facility];  Service: Gastroenterology   ??? PR COLONOSCOPY W/BIOPSY SINGLE/MULTIPLE N/A 03/21/2017    Procedure: COLONOSCOPY, FLEXIBLE, PROXIMAL TO SPLENIC FLEXURE; WITH BIOPSY, SINGLE OR MULTIPLE;  Surgeon: Modena Nunnery, MD;  Location: GI PROCEDURES [place] [facility];  Service: Gastroenterology   ??? PR COLSC FLEXIBLE W/TRANSENDOSCOPIC BALLOON  DILAT N/A 07/15/2014    Procedure: COLONOSCOPY, FLEXIBLE; WITH DILATION BY BALLOON, 1 OR MORE STRICTURES;  Surgeon: Janyth Pupa, MD;  Location: [facility]URES [facility];  Service: Gastroenterology   ??? PR REMVL COLON & TERM ILEUM W/ILEOCOLOSTOMY N/A 09/16/2016    Procedure: COLECTOMY, PARTIAL, WITH REMOVAL OF TERMINAL ILEUM WITH ILEOCOLOSTOMY;  Surgeon: Lady Gary, MD;  Location: MAIN OR [place];  Service: Gastrointestinal   ??? PR UPPER GI ENDOSCOPY,BIOPSY N/A 07/23/2012    Procedure: UGI ENDOSCOPY; WITH BIOPSY, SINGLE OR MULTIPLE;  Surgeon: Vickii Chafe, MD;  [facility]: [facility];  Service: Gastroenterology   ??? PR UPPER GI ENDOSCOPY,DIAGNOSIS N/A 11/10/2012    Procedure: UGI ENDO, INCLUDE ESOPHAGUS, STOMACH, & DUODENUM &/OR JEJUNUM; DX W/WO COLLECTION SPECIMN, BY BRUSH OR WASH;  Surgeon: Malcolm Metro, MD;  Location: [facility];  Service: Gastroenterology   ??? PR UPPER GI ENDOSCOPY,DIAGNOSIS N/A 07/21/2013    Procedure: UGI ENDO, INCLUDE ESOPHAGUS, STOMACH, & DUODENUM &/OR JEJUNUM; DX W/WO COLLECTION SPECIMN, BY BRUSH OR WASH;  Surgeon: Gwen Pounds, MD;  [facility]: [facility];  Service: Gastroenterology   ??? PR UPPER GI ENDOSCOPY,DIAGNOSIS N/A 07/15/2014    Procedure: UGI ENDO, INCLUDE ESOPHAGUS, STOMACH, & DUODENUM &/OR JEJUNUM; DX W/WO COLLECTION SPECIMN, BY BRUSH OR WASH;  Surgeon: Janyth Pupa, MD;  Location: [facility];  Service: Gastroenterology       No current facility-administered medications for this encounter.     Current Outpatient Medications:   ???  cholestyramine (QUESTRAN) 4 gram packet, Take 1 packet by mouth Three (3) times a day with a meal. 1 packet by mouth 3x daily before meals. Don't take within 2hrs of other meds, Disp: 60 each, Rfl: 2  ???  dronabinol (MARINOL) 10 MG capsule, Take 1 capsule (10 mg total) by mouth Two (2) times a day (30 minutes before a meal)., Disp: 60 capsule, Rfl: 2  ??? empty container Misc, Use as directed, Disp: 1 each, Rfl: PRN  ???  famotidine (PEPCID) 20 MG tablet, Take 20 mg by mouth two (2) times a day as needed for heartburn., Disp: , Rfl:   ???  gabapentin (NEURONTIN) 300 MG capsule, Start with 300-300-600 x 1 week, then 600-300-600 x 1 week, then 600-600-600 if well tolerated, Disp: 180 capsule, Rfl: 1  ???  hyoscyamine (ANASPAZ,LEVSIN) 0.125 mg tablet, TAKE 1 TABLET BY MOUTH EVERY 4 HOURS AS NEEDED FOR CRAMPING, Disp: 30 tablet, Rfl: 11  ???  multivitamins, therapeutic with minerals tablet, Take 1 tablet by mouth daily., Disp: 30 tablet, Rfl: 1  ???  oxyCODONE-acetaminophen (PERCOCET) 10-325 mg per tablet, Take 1 tablet by mouth every eight (8) hours as needed for pain. , Disp: , Rfl:   ???  promethazine (PHENERGAN) 12.5 MG tablet, Take 2 tablets (25 mg total) by mouth every six (6) hours as needed for nausea., Disp: 15 tablet, Rfl: 3  ???  sildenafil (VIAGRA) 50 MG tablet, Take 1 tablet (50 mg total) by mouth daily as needed for erectile dysfunction., Disp: 10 tablet, Rfl: 11  ???  ustekinumab (STELARA) 90 mg/mL Syrg syringe, Inject 1 mL (90 mg total) under the skin every 8 weeks., Disp: 1 mL, Rfl: 5  ???  oxyCODONE-acetaminophen (PERCOCET) 10-325 mg per tablet, Take 1 tablet by mouth every six (6) hours as needed for pain. for up to 10 doses, Disp: 10 tablet, Rfl: 0  ???  vancomycin (VANCOCIN) 25 mg/mL Susp oral suspension, Take 5 mL (  125 mg total) by mouth Every six (6) hours. for 8 days, Disp: 160 mL, Rfl: 0    Allergies  Vicodin [hydrocodone-acetaminophen]; Tramadol; Dilaudid [hydromorphone]; Morphine; and Opioids - morphine analogues    Family History   Problem Relation Age of Onset   ??? Hyperlipidemia Father    ??? Cancer Maternal Aunt    ??? No Known Problems Mother    ??? No Known Problems Sister    ??? No Known Problems Brother    ??? No Known Problems Maternal Uncle    ??? No Known Problems Paternal Aunt    ??? No Known Problems Paternal Uncle    ??? No Known Problems Maternal Grandmother    ??? No Known Problems Maternal Grandfather    ??? No Known Problems Paternal Grandmother    ??? No Known Problems Paternal Grandfather    ??? Anesthesia problems Neg Hx    ??? Broken bones Neg Hx    ??? Clotting disorder Neg Hx    ??? Collagen disease Neg Hx    ??? Diabetes Neg Hx    ??? Dislocations Neg Hx    ??? Fibromyalgia Neg Hx    ??? Gout Neg Hx    ??? Hemophilia Neg Hx    ??? Osteoporosis Neg Hx    ??? Rheumatologic disease Neg Hx    ??? Scoliosis Neg Hx    ??? Severe sprains Neg Hx    ??? Sickle cell anemia Neg Hx    ??? Spinal Compression Fracture Neg Hx        Social History  Social History     Tobacco Use   ??? Smoking status: Former Smoker     Packs/day: 1.00     Years: 18.00     Pack years: 18.00     Types: Cigarettes     Start date: 08/27/2003     Last attempt to quit: 06/06/2016     Years since quitting: 1.5   ??? Smokeless tobacco: Never Used   ??? Tobacco comment: Pt smokes 1ppd, Pt is interested in tobacco cessation    Substance Use Topics   ??? Alcohol use: No   ??? Drug use: Yes     Types: Marijuana       Review of Systems    Constitutional: Negative for fever or chills. Negative for wt. loss.  Cardiovascular: Negative for chest pain or palpitations.  Respiratory: Negative for shortness of breath or cough.  Gastrointestinal: Positive for abdominal pain, nausea, vomiting, or diarrhea.  Musculoskeletal: Negative for back pain. Negative for neck pain. Negative for joint pain or swelling.  Skin: Negative for rash. Negative for pallor. Negative for diaphoresis.    Physical Exam     VITAL SIGNS:    Vitals:    12/10/17 2353 12/11/17 0400 12/11/17 0737 12/11/17 1108   BP: 128/87 135/86 151/101 137/92   Pulse: 63 68 65 73   Resp: 16 16 16 16    Temp: 36.7 ??C (98.1 ??F) 36.7 ??C (98.1 ??F) 36.5 ??C (97.7 ??F) 36.6 ??C (97.9 ??F)   TempSrc: Oral Oral Oral Oral   SpO2: 99% 95% 100% 100%   Weight:       Height:             Constitutional: Alert and oriented. Well appearing and in no distress.  Hematological/Lymphatic/Immunilogical: No cervical lymphadenopathy. Cardiovascular: Normal rate, regular rhythm. Normal and symmetric distal pulses are present in all extremities. No gallops, murmurs, or clicks.  Respiratory: Normal respiratory effort. Breath sounds are normal.  Gastrointestinal: Soft and tender to epigastric region. BS active. No guarding or rigidity. No CVA tenderness.  Musculoskeletal: Nontender joints and muscles with normal range of motion in all extremities.  Neurologic: Normal speech and language.   Steady gait.  Skin: Skin is warm, dry and intact. No rash noted. No pallor.  Psychiatric: Mood and affect are normal. Speech and behavior are normal.       [employer], AGNP  12/12/17 2038

## 2017-12-09 NOTE — Unmapped (Signed)
Hx of Crohn's. Here for ongoing abdominal pain, nausea and diarrhea x 3 weeks. Seen here on 11/3.

## 2017-12-10 NOTE — Unmapped (Signed)
Patient rounds completed. The following patient needs were addressed:  Pain, Toileting, Personal Belongings, Plan of Care, Call Bell in Reach and Bed Position Low .

## 2017-12-10 NOTE — Unmapped (Signed)
Identifying Information:  Rodney Bender [facility]  [birth year]  [id number]     Admit Date: 12/09/2017    Discharge Date: 12/11/2017     Discharge Service: General Medicine (MED)    Discharge Attending Physician: Rodney Mcalpine, MD    Discharge to: Home    Discharge Diagnoses:  Principal Problem:    Clostridium difficile enteritis  Active Problems:    Crohn's disease of both small and large intestine with intestinal obstruction (CMS-HCC)    Abdominal pain, chronic, right lower quadrant  Resolved Problems:    * No resolved hospital problems. [facility] Course:  Rodney Bender is a 47 y.o. male that presented to [facility] with diarrhea, positive for Clostridium difficile. He was recently on Clindamycin for dental infection. Labs unremarkable with normal WBC. Vitals stable with no fevers. He was started on Vancomycin and the following morning was feeling better, tolerating regular diet. He was stable for discharge, will complete a total of 10 days of Vancomycin.       Procedures:  No admission procedures for hospital encounter.    ______________________________________________________________________  Discharge Day Services:  BP 151/101  - Pulse 65  - Temp 36.5 ??C (97.7 ??F) (Oral)  - Resp 16  - Ht 180.3 cm (5' 11)  - Wt 57.8 kg (127 lb 6.8 oz)  - SpO2 100%  - BMI 17.77 kg/m??   Pt seen on the day of discharge and determined appropriate for discharge.    Condition at Discharge: good  ______________________________________________________________________  Discharge Medications:     Your Medication List      START taking these medications    vancomycin 25 mg/mL Susp oral suspension  Commonly known as:  VANCOCIN  Take 5 mL (125 mg total) by mouth Every six (6) hours. for 8 days        CHANGE how you take these medications    oxyCODONE-acetaminophen 10-325 mg per tablet  Commonly known as:  PERCOCET  Take 1 tablet by mouth every eight (8) hours as needed for pain.  What changed:  Another medication with the same name was added. Make sure you understand how and when to take each.     oxyCODONE-acetaminophen 10-325 mg per tablet  Commonly known as:  PERCOCET  Take 1 tablet by mouth every six (6) hours as needed for pain. for up to 10 doses  What changed:  You were already taking a medication with the same name, and this prescription was added. Make sure you understand how and when to take each.        CONTINUE taking these medications    cholestyramine 4 gram packet  Commonly known as:  QUESTRAN  Take 1 packet by mouth Three (3) times a day with a meal. 1 packet by mouth 3x daily before meals. Don't take within 2hrs of other meds     desipramine 50 MG tablet  Commonly known as:  NORPRAMIN  Take 1 tablet (50 mg total) by mouth daily.     dronabinol 10 MG capsule  Commonly known as:  MARINOL  Take 1 capsule (10 mg total) by mouth Two (2) times a day (30 minutes before a meal).     empty container Misc  Use as directed     famotidine 20 MG tablet  Commonly known as:  PEPCID  Take 20 mg by mouth two (2) times a day as needed for heartburn.     gabapentin 300 MG capsule  Commonly known as:  NEURONTIN  Start with 300-300-600 x 1 week, then 600-300-600 x 1 week, then 600-600-600 if well tolerated     hyoscyamine 0.125 mg tablet  Commonly known as:  ANASPAZ,LEVSIN  TAKE 1 TABLET BY MOUTH EVERY 4 HOURS AS NEEDED FOR CRAMPING     multivitamins, therapeutic with minerals 9 mg iron-400 mcg tablet  Take 1 tablet by mouth daily.     promethazine 12.5 MG tablet  Commonly known as:  PHENERGAN  Take 2 tablets (25 mg total) by mouth every six (6) hours as needed for nausea.     sildenafil 50 MG tablet  Commonly known as:  VIAGRA  Take 1 tablet (50 mg total) by mouth daily as needed for erectile dysfunction.     STELARA 90 mg/mL Syrg syringe  Generic drug:  ustekinumab  Inject 1 mL (90 mg total) under the skin every 8 weeks.        ASK your doctor about these medications    promethazine 25 MG tablet  Commonly known as:  PHENERGAN  Take 1 tablet (25 mg total) by mouth every six (6) hours as needed for nausea. for up to 7 days  Ask about: Should I take this medication?          ______________________________________________________________________  Pending Test Results (if blank, then none):      Most Recent Labs:  Microbiology Results (last day)     ** No results found for the last 24 hours. **          Lab Results   Component Value Date    WBC 10.2 12/09/2017    HGB 13.1 (L) 12/09/2017    HCT 38.6 (L) 12/09/2017    PLT 300 12/09/2017       Lab Results   Component Value Date    NA 133 (L) 12/09/2017    K 4.8 12/09/2017    CL 100 12/09/2017    CO2 23.0 12/09/2017    BUN 7 12/09/2017    CREATININE 0.84 12/09/2017    CALCIUM 9.6 12/09/2017    MG 1.4 (L) 10/23/2016    PHOS 3.0 10/23/2016       Lab Results   Component Value Date    ALKPHOS 99 12/09/2017    BILITOT 0.5 12/09/2017    BILIDIR 0.30 09/12/2017    PROT 7.8 12/09/2017    ALBUMIN 4.6 12/09/2017    ALT 27 12/09/2017    AST 36 12/09/2017    GGT 32 01/15/2011       Lab Results   Component Value Date    PT 18.1 (H) 09/25/2016    INR 1.59 09/25/2016    APTT 28.6 09/25/2016     Hospital Radiology:  No results found.    ______________________________________________________________________    Discharge Instructions:  Activity Instructions     Activity as tolerated            Diet Instructions     Discharge diet (specify)      Discharge Nutrition Therapy:  General              Follow Up instructions and Outpatient Referrals     Call MD for:  persistent nausea or vomiting      Call MD for:  severe uncontrolled pain      Call MD for: Temperature > 38.5 Celsius ( > 101.3 Fahrenheit)                Length of Discharge: I spent greater  than 30 mins in the discharge of this patient.

## 2017-12-10 NOTE — Unmapped (Signed)
Visit with my outpatient in emergency department    Subjective: Rodney Bender and his wife gave the history. They describe him as doing very well, adhereing to [facility], and being essentially asymptomatic until he became ill about three weeks ago. His wife developed similar symptoms at the same time, but got better after abut two weeks. He complains of worsening of baseline abdominal pain, very frequent diarrhea, and nausea and vomiting. He denies fevers, new rashes, blood in stool (a few specks but not consistent with his prior Crohn's flares). He was expose to clindamycin for a dental procedure almost immediately before symptoms started.    Objective:  VITAL SIGNS:   T 36.4  BP 132/88  P 74  RR 16    Wt Readings from Last 6 Encounters:   09/12/17 55.5 kg (122 lb 4.8 oz)   05/09/17 53.3 kg (117 lb 9.6 oz)   03/21/17 59 kg (130 lb)   03/14/17 57.5 kg (126 lb 12.8 oz)   01/09/17 53.1 kg (117 lb 1.6 oz)   11/25/16 58.5 kg (129 lb)     PHYSICAL EXAM:  General appearance: mild distress and dehydrated.  Eyes: Anicteric sclera. No erythema.  ENT: No oral ulcers. Posterior oropharynx unremarkable.  Cardiovascular: RRR without murmurs, heaves, or thrills. No lower extremity edema.  Pulmonary: Normal work of breathing. Acyanotic.  Abdominal: soft, tender especially lower, non-peritonitic, (pain is usually more epigastric for him, but this exam overall not far from baseline), nondistended, no masses or organomegaly.  Musculoskeletal: No temporal wasting. Normal joints of the hand.  Skin: No jaundice. No rashes.  Neurologic: Alert, oriented, and appropriate.  Psychiatric: Appropriate.      LABORATORY RESULTS:  CBC:  WBC   Date Value Ref Range Status   12/09/2017 10.2 4.5 - 11.0 10*9/L Final   11/30/2017 7.1 4.5 - 11.0 10*9/L Final   09/12/2017 7.8 4.5 - 11.0 10*9/L Final   05/09/2017 8.9 4.5 - 11.0 10*9/L Final   03/12/2017 11.2 (H) 4.5 - 11.0 10*9/L Final   01/09/2017 9.4 4.5 - 11.0 10*9/L Final   07/21/2013 10.3 4.5 - 11.0 10*9/L Final   07/20/2013 10.4 4.5 - 11.0 10*9/L Final   07/19/2013 6.9 4.5 - 11.0 10*9/L Final   07/19/2013 7.1 4.5 - 11.0 10*9/L Final   07/18/2013 10.9 4.5 - 11.0 10*9/L Final   07/14/2013 9.2 4.5 - 11.0 10*9/L Final     Absolute Neutrophils   Date Value Ref Range Status   12/09/2017 6.6 2.0 - 7.5 10*9/L Final   11/30/2017 4.9 2.0 - 7.5 10*9/L Final   09/12/2017 5.5 2.0 - 7.5 10*9/L Final   03/12/2017 8.6 (H) 2.0 - 7.5 10*9/L Final   01/07/2017 4.6 2.0 - 7.5 10*9/L Final   10/21/2016 6.1 2.0 - 7.5 10*9/L Final   07/21/2013 9.0 (H) 2.0 - 7.5 10*9/L Final   07/20/2013 9.1 (H) 2.0 - 7.5 10*9/L Final   07/19/2013 5.7 2.0 - 7.5 10*9/L Final   07/19/2013 5.9 2.0 - 7.5 10*9/L Final   07/18/2013 6.8 2.0 - 7.5 10*9/L Final   07/14/2013 6.1 2.0 - 7.5 10*9/L Final     HGB   Date Value Ref Range Status   12/09/2017 13.1 (L) 13.5 - 17.5 g/dL Final   28/41/3244 01.0 (L) 13.5 - 17.5 g/dL Final   27/25/3664 40.3 (L) 13.5 - 17.5 g/dL Final   47/42/5956 38.7 (L) 13.5 - 17.5 g/dL Final   56/43/3295 18.8 (L) 13.5 - 17.5 g/dL Final   41/66/0630 16.0 13.5 -  17.5 g/dL Final   16/10/9602 54.0 (L) 13.5 - 17.5 g/dL Final   98/11/9145 82.9 (L) 13.5 - 17.5 g/dL Final   56/21/3086 57.8 (L) 13.5 - 17.5 g/dL Final   46/96/2952 84.1 (L) 13.5 - 17.5 g/dL Final   32/44/0102 72.5 (L) 13.5 - 17.5 g/dL Final   36/64/4034 74.2 13.5 - 17.5 g/dL Final     Platelet   Date Value Ref Range Status   12/09/2017 300 150 - 440 10*9/L Final   11/30/2017 285 150 - 440 10*9/L Final   09/12/2017 349 150 - 440 10*9/L Final   05/09/2017 294 150 - 440 10*9/L Final   03/12/2017 365 150 - 440 10*9/L Final   01/09/2017 309 150 - 440 10*9/L Final   07/21/2013 176 150 - 440 10*9/L Final   07/20/2013 205 150 - 440 10*9/L Final   07/19/2013 191 150 - 440 10*9/L Final   07/19/2013 206 150 - 440 10*9/L Final   07/18/2013 235 150 - 440 10*9/L Final   07/14/2013 270 150 - 440 10*9/L Final     BMP:  Sodium   Date Value Ref Range Status   12/09/2017 133 (L) 135 - 145 mmol/L Final   11/30/2017 135 135 - 145 mmol/L Final   09/12/2017 133 (L) 135 - 145 mmol/L Final   05/09/2017 138 135 - 145 mmol/L Final   03/12/2017 140 135 - 145 mmol/L Final   01/09/2017 135 135 - 145 mmol/L Final   07/21/2013 130 (L) 135 - 145 mmol/L Final   07/20/2013 131 (L) 135 - 145 mmol/L Final   07/19/2013 133 (L) 135 - 145 mmol/L Final   07/19/2013 131 (L) 135 - 145 mmol/L Final   07/18/2013 135 135 - 145 mmol/L Final   07/14/2013 133 (L) 135 - 145 mmol/L Final     Potassium   Date Value Ref Range Status   12/09/2017 4.8 3.5 - 5.0 mmol/L Final   11/30/2017 4.2 3.5 - 5.0 mmol/L Final   09/12/2017 4.5 3.5 - 5.0 mmol/L Final   05/09/2017 4.3 3.5 - 5.0 mmol/L Final   03/12/2017 4.2 3.5 - 5.0 mmol/L Final   01/09/2017 4.6 3.5 - 5.0 mmol/L Final   07/21/2013 4.2 3.5 - 5.0 mmol/L Final   07/20/2013 4.3 3.5 - 5.0 mmol/L Final   07/19/2013 5.3 (H) 3.5 - 5.0 mmol/L Final   07/19/2013 4.8 3.5 - 5.0 mmol/L Final   07/18/2013 4.4 3.5 - 5.0 mmol/L Final   07/14/2013 4.1 3.5 - 5.0 mmol/L Final     Chloride   Date Value Ref Range Status   12/09/2017 100 98 - 107 mmol/L Final   11/30/2017 100 98 - 107 mmol/L Final   09/12/2017 96 (L) 98 - 107 mmol/L Final   05/09/2017 103 98 - 107 mmol/L Final   03/12/2017 102 98 - 107 mmol/L Final   01/09/2017 99 98 - 107 mmol/L Final   07/21/2013 102 98 - 107 mmol/L Final   07/20/2013 103 98 - 107 mmol/L Final   07/19/2013 106 98 - 107 mmol/L Final   07/19/2013 106 98 - 107 mmol/L Final   07/18/2013 103 98 - 107 mmol/L Final   07/14/2013 98 98 - 107 mmol/L Final     CO2   Date Value Ref Range Status   12/09/2017 23.0 22.0 - 30.0 mmol/L Final   11/30/2017 22.0 22.0 - 30.0 mmol/L Final   09/12/2017 23.0 22.0 - 30.0 mmol/L Final   05/09/2017 24.0 22.0 - 30.0 mmol/L  Final   03/12/2017 25.0 22.0 - 30.0 mmol/L Final   01/09/2017 23.0 22.0 - 30.0 mmol/L Final   07/21/2013 23 22 - 30 mmol/L Final   07/20/2013 25 22 - 30 mmol/L Final   07/19/2013 21 (L) 22 - 30 mmol/L Final   07/19/2013 21 (L) 22 - 30 mmol/L Final   07/18/2013 23 22 - 30 mmol/L Final   07/14/2013 23 22 - 30 mmol/L Final     BUN   Date Value Ref Range Status   12/09/2017 7 7 - 21 mg/dL Final   16/10/9602 9 7 - 21 mg/dL Final   54/09/8117 16 7 - 21 mg/dL Final   14/78/2956 14 7 - 21 mg/dL Final   21/30/8657 13 7 - 21 mg/dL Final   84/69/6295 12 7 - 21 mg/dL Final   28/41/3244 7 7 - 21 mg/dL Final   01/30/7251 7 7 - 21 mg/dL Final   66/44/0347 9 7 - 21 mg/dL Final   42/59/5638 9 7 - 21 mg/dL Final   75/64/3329 11 7 - 21 mg/dL Final   51/88/4166 16 7 - 21 mg/dL Final     Creatinine   Date Value Ref Range Status   12/09/2017 0.84 0.70 - 1.30 mg/dL Final   07/28/1599 0.93 0.70 - 1.30 mg/dL Final   23/55/7322 0.25 0.70 - 1.30 mg/dL Final   42/70/6237 6.28 0.70 - 1.30 mg/dL Final   31/51/7616 0.73 0.70 - 1.30 mg/dL Final   71/06/2692 8.54 0.70 - 1.30 mg/dL Final   62/70/3500 9.38 0.70 - 1.30 mg/dL Final   18/29/9371 6.96 0.70 - 1.30 mg/dL Final   78/93/8101 7.51 0.70 - 1.30 mg/dL Final   02/58/5277 8.24 0.70 - 1.30 mg/dL Final   23/53/6144 3.15 0.70 - 1.30 mg/dL Final   40/08/6759 9.50 (H) 0.70 - 1.30 mg/dL Final     BUN/Creatinine Ratio   Date Value Ref Range Status   12/09/2017 8  Final   11/30/2017 10  Final   09/12/2017 17  Final   05/09/2017 16  Final   03/12/2017 15  Final   01/09/2017 14  Final   07/21/2013 9 UNDEFINED Final   07/20/2013 9 UNDEFINED Final   07/19/2013 11 UNDEFINED Final   07/19/2013 10 UNDEFINED Final   07/18/2013 10 UNDEFINED Final   07/14/2013 11 UNDEFINED Final     Glucose   Date Value Ref Range Status   12/09/2017 72 65 - 179 mg/dL Final   93/26/7124 580 65 - 179 mg/dL Final   99/83/3825 053 65 - 179 mg/dL Final   97/67/3419 73 65 - 179 mg/dL Final   37/90/2409 83 65 - 179 mg/dL Final   73/53/2992 51 (L) 65 - 179 mg/dL Final   42/68/3419 89 65 - 179 mg/dL Final   62/22/9798 93 65 - 179 mg/dL Final   92/11/9415 93 65 - 179 mg/dL Final   40/81/4481 856 65 - 179 mg/dL Final   31/49/7026 77 65 - 179 mg/dL Final   37/85/8850 63 (L) 65 - 179 mg/dL Final     Calcium   Date Value Ref Range Status   12/09/2017 9.6 8.5 - 10.2 mg/dL Final   27/74/1287 9.6 8.5 - 10.2 mg/dL Final   86/76/7209 9.7 8.5 - 10.2 mg/dL Final   47/09/6281 9.7 8.5 - 10.2 mg/dL Final   66/29/4765 46.5 8.5 - 10.2 mg/dL Final   03/54/6568 9.9 8.5 - 10.2 mg/dL Final   12/75/1700 8.7 8.5 - 10.2 mg/dL Final  07/20/2013 8.5 8.5 - 10.2 mg/dL Final   16/10/9602 8.3 (L) 8.5 - 10.2 mg/dL Final   54/09/8117 8.2 (L) 8.5 - 10.2 mg/dL Final   14/78/2956 9.2 8.5 - 10.2 mg/dL Final   21/30/8657 84.6 8.5 - 10.2 mg/dL Final     Liver panel:  PT   Date Value Ref Range Status   09/25/2016 18.1 (H) 10.2 - 12.8 sec Final   05/21/2010 11.7 9.7 - 12.6 SECONDS Final     INR   Date Value Ref Range Status   09/25/2016 1.59  Final   05/21/2010 1.1  Final     Albumin   Date Value Ref Range Status   12/09/2017 4.6 3.5 - 5.0 g/dL Final   96/29/5284 4.6 3.5 - 5.0 g/dL Final   13/24/4010 4.8 3.5 - 5.0 g/dL Final   27/25/3664 4.8 3.5 - 5.0 g/dL Final   40/34/7425 5.0 3.5 - 5.0 g/dL Final   95/63/8756 4.9 3.5 - 5.0 g/dL Final   43/32/9518 3.9 3.5 - 5.0 g/dL Final   84/16/6063 4.4 3.5 - 5.0 g/dL Final   01/60/1093 3.8 3.5 - 5.0 g/dL Final   23/55/7322 3.1 (L) 3.5 - 5.0 g/dL Final   02/54/2706 3.6 3.5 - 5.0 g/dL Final   23/76/2831 3.6 3.5 - 5.0 G/DL Final     Total Protein   Date Value Ref Range Status   12/09/2017 7.8 6.5 - 8.3 g/dL Final   51/76/1607 7.7 6.5 - 8.3 g/dL Final   37/10/6267 8.1 6.5 - 8.3 g/dL Final   48/54/6270 8.1 6.5 - 8.3 g/dL Final   35/00/9381 8.0 6.5 - 8.3 g/dL Final   82/99/3716 8.1 6.5 - 8.3 g/dL Final   96/78/9381 6.5 (L) 6.6 - 8.0 g/dL Final   01/75/1025 7.5 6.6 - 8.0 g/dL Final   85/27/7824 6.6 6.6 - 8.0 g/dL Final   23/53/6144 6.0 (L) 6.6 - 8.0 g/dL Final   31/54/0086 6.8 6.6 - 8.0 g/dL Final   76/19/5093 6.4 (L) 6.6 - 8.0 G/DL Final     Total Bilirubin   Date Value Ref Range Status   12/09/2017 0.5 0.0 - 1.2 mg/dL Final   26/71/2458 0.6 0.0 - 1.2 mg/dL Final   09/98/3382 1.3 (H) 0.0 - 1.2 mg/dL Final   50/53/9767 1.0 0.0 - 1.2 mg/dL Final   34/19/3790 1.1 0.0 - 1.2 mg/dL Final   24/09/7351 0.5 0.0 - 1.2 mg/dL Final   29/92/4268 0.9 0.0 - 1.2 mg/dL Final   34/19/6222 1.0 0.0 - 1.2 mg/dL Final   97/98/9211 0.9 0.0 - 1.2 mg/dL Final   94/17/4081 0.6 0.0 - 1.2 mg/dL Final   44/81/8563 0.5 0.0 - 1.2 mg/dL Final   14/97/0263 0.9 0.0 - 1.2 MG/DL Final     Bilirubin, Direct   Date Value Ref Range Status   09/12/2017 0.30 0.00 - 0.40 mg/dL Final   78/58/8502 <7.74 0.00 - 0.40 mg/dL Final   12/87/8676 7.20 0.00 - 0.40 mg/dL Final   94/70/9628 3.66 0.00 - 0.40 mg/dL Final   29/47/6546 0.4 0.0 - 0.4 mg/dL Final     AST   Date Value Ref Range Status   12/09/2017 36 19 - 55 U/L Final   11/30/2017 22 19 - 55 U/L Final   09/12/2017 29 19 - 55 U/L Final   05/09/2017 25 19 - 55 U/L Final   03/12/2017 21 19 - 55 U/L Final   01/07/2017 30 19 - 55 U/L Final   07/18/2013 26 19 -  55 U/L Final   07/14/2013 27 19 - 55 U/L Final   06/07/2013 30 19 - 55 U/L Final   11/05/2012 15 (L) 19 - 55 U/L Final   07/21/2012 19 19 - 55 U/L Final   07/30/2011 15 (L) 19 - 55 U/L Final     ALT   Date Value Ref Range Status   12/09/2017 27 <50 U/L Final   11/30/2017 13 <50 U/L Final   09/12/2017 17 (L) 19 - 72 U/L Final   05/09/2017 29 19 - 72 U/L Final   03/12/2017 22 19 - 72 U/L Final   01/07/2017 24 19 - 72 U/L Final   07/18/2013 28 19 - 72 U/L Final   07/14/2013 18 (L) 19 - 72 U/L Final   06/07/2013 17 (L) 19 - 72 U/L Final   11/05/2012 18 (L) 19 - 72 U/L Final   07/21/2012 18 (L) 19 - 72 U/L Final   07/30/2011 28 19 - 72 U/L Final     Alkaline Phosphatase   Date Value Ref Range Status   12/09/2017 99 38 - 126 U/L Final   11/30/2017 74 38 - 126 U/L Final   09/12/2017 65 38 - 126 U/L Final   05/09/2017 66 38 - 126 U/L Final   03/12/2017 78 38 - 126 U/L Final   01/07/2017 80 38 - 126 U/L Final   07/18/2013 87 38 - 126 U/L Final   07/14/2013 79 38 - 126 U/L Final   06/07/2013 61 38 - 126 U/L Final   11/05/2012 61 38 - 126 U/L Final 07/21/2012 66 38 - 126 U/L Final   03/08/2011 92 38 - 126 U/L Final     Anemia:  MCV   Date Value Ref Range Status   12/09/2017 95.0 80.0 - 100.0 fL Final   07/21/2013 99 80 - 100 fL Final     RDW   Date Value Ref Range Status   12/09/2017 13.3 12.0 - 15.0 % Final   07/21/2013 13.2 12.0 - 15.0 % Final     Ferritin   Date Value Ref Range Status   01/08/2013 92 27 - 377 ng/mL Final     Iron Saturation (%)   Date Value Ref Range Status   01/08/2013 33 20 - 50 % Final     TIBC   Date Value Ref Range Status   01/08/2013 309 252 - 479 ug/dL Final     Folate   Date Value Ref Range Status   10/24/2010 8.0 2.7 - 20.0 NG/ML Final     Vitamin B-12   Date Value Ref Range Status   05/09/2017 378 193 - 900 pg/ml Final   07/30/2011 392 193 - 900 PG/ML Final     Luminal etiologies:  IgA   Date Value Ref Range Status   01/15/2011 443 (H) 40 - 400 MG/DL Final     CRP   Date Value Ref Range Status   12/09/2017 <5.0 <10.0 mg/L Final   07/18/2013 0.7 0.0 - 1.0 mg/dL Final     Sed Rate   Date Value Ref Range Status   12/09/2017 6 0 - 15 mm/h Final   07/18/2013 6 0 - 15 mm/h Final     Cortisol   Date Value Ref Range Status   05/21/2010 6.9 MCG/DL Final     Comment:     :  Before 10AM: 4.5-22.7 ug/dl  After  5PM: 9.6-04.5 ug/dl     TSH   Date  Value Ref Range Status   01/15/2011 0.51 (L) 0.60 - 3.30 MICROIU/ML Final     Free T4   Date Value Ref Range Status   01/15/2011 1.01 0.71 - 1.40 NG/DL Final     Liver etiologies:  Hep B Core IgM   Date Value Ref Range Status   07/23/2012 Nonreactive  Final     Hep B S Ab   Date Value Ref Range Status   07/23/2012 Nonreactive  Final     Hepatitis B Surface Ag   Date Value Ref Range Status   01/08/2013 Negative  Final     Hepatitis C Ab   Date Value Ref Range Status   07/23/2012 Negative  Final     Comment:     Antibodies to HCV were NOT detected.  A nonreactive result  does not exclude the possibility of exposure to HCV.     Assessment and Plan:  Rodney Oris, MD   Resident   Gastroenterology Progress Notes   Signed   Encounter Date:  09/12/2017               Expand All Collapse All      [x] Hide copied text    [x] Hover for details  [facility] GASTROENTEROLOGY CONSULTATION CLINIC VISIT  ??  REFERRING PROVIDER: [facility]jie Karvon[facility]  [address]  [address] [facility]  [place], [place] [address]  ??  PRIMARY CARE PROVIDER: [facility]  ??  PATIENT PROFILE: Rodney Bender is a 47 y.o. man who is seen in consultation at the request of Dr. [facility] for evaluation of Crohn's disease.  ??  SUBJECTIVE:   ??      Chief Complaint    ?? Chronic Condition Follow-Up; Crohn's Disease; Nausea; Vomiting; Diarrhea; Abdominal Pain   ??   ??  HISTORY OF PRESENT ILLNESS:                                                  Rodney Bender is a 47 y.o. man with history significant for ileal stricturing Crohn's disease s/p four ileocecal resections with a long history of adherence issues and fractured care across institutions who presents for routine follow-up.  ??  Crohn's disease history:  1. Stricturing ileocolonic Crohn's disease diagnosed 1990  2. Status post ileocecal resection 1998 due to stricture, with segmental ileocolectomy 2009 due to severe recurrent stricturing Crohn's disease.  3. On Remicade for a short time in the past, however discontinued due to noncompliance and transportation problems.  4. Previous he followed by Dr. Gwenith Spitz here at [facility] but terminated due to repeated no-shows and medical noncompliance  5. Previously on 6-MP, however this medication was discontinued after patient had undetectable drug levels on a recent admission  6. Recalls being on Humira at one point, but unclear exactly when.  7. He had additional ileocolonic resection by Koruda 09/16/2016, doing clinically much better for about six months after that, started on Stelara getting injections on site to insure adherence.  8. Stricturing ileocolonic Crohn's disease diagnosed 1990  9. Status post ileocecal resection 1998 due to stricture, with segmental ileocolectomy 2009 due to severe recurrent stricturing Crohn's disease.  10. On Remicade for a short time in the past, however discontinued due to noncompliance and transportation problems.  11. Previous he followed  by Dr. Gwenith Spitz here at [facility] but terminated due to repeated no-shows and medical noncompliance  12. Previously on 6-MP, however this medication was discontinued after patient had undetectable drug levels on a recent admission  13. Recalls being on Humira at one point, but unclear exactly when.  14. He had additional ileocolonic resection by Koruda 09/16/2016, doing clinically much better for about six months after that, started on Stelara getting injections on site to insure adherence.  15. Symptoms improved and gained weight after surgery, did very well for several months, then developed nausea and vomiting and recurrent chronic abdominal pain. Imaging and labs were reassuring against flare and colonoscopy was without active disease.   ??  Interval History:  He describes generally doing very well for some weeks. He has been attending his injection visits.   ??  About three days prior he had onset of nausea, vomiting, and diarrhea, accompanied by a fever to 103F. His chronic abdominal pain has worsened. He has had watery diarrhea, no blood in stool. He has been going 4-6 times daily. He continues on Questran once a day. He still takes Marinol. He stopped mirtazapine, didn't feel like it helped with sleep or appetite unfortunately.  ??  He also complains of difficulty sleeping. He tried trazodone for this but it paradoxically kept him awake first.   ??  REVIEW OF SYSTEMS:   The balance of 12 systems reviewed is negative except as noted in the history of present illness.  ??  Past??Medical??History   Past Medical History:   Diagnosis Date   ??? Crohn's disease (CMS-HCC) ??   ?? diagnosed in 1990   ??? GERD (gastroesophageal reflux disease) ??   ??? Hypertension 10/08/2016      ??  Past??Surgical??History         Past Surgical History:   Procedure Laterality Date   ??? COLON SURGERY ?? ??   ??? PR COLONOSCOPY FLX DX W/COLLJ SPEC WHEN PFRMD Left 11/10/2012   ?? Procedure: COLONOSCOPY, FLEXIBLE, PROXIMAL TO SPLENIC FLEXURE; DIAGNOSTIC, W/WO COLLECTION SPECIMEN BY BRUSH OR WASH;  Surgeon: Malcolm Metro, MD;  [facility];  Service: Gastroenterology   ??? PR COLONOSCOPY FLX DX W/COLLJ SPEC WHEN PFRMD N/A 07/21/2013   ?? Procedure: COLONOSCOPY, FLEXIBLE, PROXIMAL TO SPLENIC FLEXURE; DIAGNOSTIC, W/WO COLLECTION SPECIMEN BY BRUSH OR WASH;  Surgeon: Gwen Pounds, MD;  [facility]: [facility];  Service: Gastroenterology   ??? PR COLONOSCOPY FLX DX W/COLLJ SPEC WHEN PFRMD N/A 08/09/2016   ?? Procedure: COLONOSCOPY, FLEXIBLE, PROXIMAL TO SPLENIC FLEXURE; DIAGNOSTIC, W/WO COLLECTION SPECIMEN BY BRUSH OR WASH;  Surgeon: Janyth Pupa, MD;  [facility];  Service: Gastroenterology   ??? PR COLONOSCOPY W/BIOPSY SINGLE/MULTIPLE ?? 07/23/2012   ?? Procedure: COLONOSCOPY, FLEXIBLE, PROXIMAL TO SPLENIC FLEXURE; WITH BIOPSY, SINGLE OR MULTIPLE;  Surgeon: Vickii Chafe, MD;  [facility]: [facility];  Service: Gastroenterology   ??? PR COLONOSCOPY W/BIOPSY SINGLE/MULTIPLE N/A 07/15/2014   ?? Procedure: COLONOSCOPY, FLEXIBLE, PROXIMAL TO SPLENIC FLEXURE; WITH BIOPSY, SINGLE OR MULTIPLE;  Surgeon: Janyth Pupa, MD;  Location: [facility];  Service: Gastroenterology   ??? PR COLONOSCOPY W/BIOPSY SINGLE/MULTIPLE N/A 03/21/2017   ?? Procedure: COLONOSCOPY, FLEXIBLE, PROXIMAL TO SPLENIC FLEXURE; WITH BIOPSY, SINGLE OR MULTIPLE;  Surgeon: Modena Nunnery, MD;  Location: [facility];  Service: Gastroenterology   ??? PR COLSC FLEXIBLE W/TRANSENDOSCOPIC BALLOON DILAT N/A 07/15/2014   ?? Procedure: COLONOSCOPY, FLEXIBLE; WITH DILATION BY BALLOON,  1 OR MORE STRICTURES;  Surgeon: Janyth Pupa, MD;  Location: [facility];  Service: Gastroenterology   ??? PR REMVL COLON & TERM ILEUM W/ILEOCOLOSTOMY N/A 09/16/2016   ?? Procedure: COLECTOMY, PARTIAL, WITH REMOVAL OF TERMINAL ILEUM WITH ILEOCOLOSTOMY;  Surgeon: Lady Gary, MD;  Location: MAIN OR [place];  Service: Gastrointestinal   ??? PR UPPER GI ENDOSCOPY,BIOPSY N/A 07/23/2012   ?? Procedure: UGI ENDOSCOPY; WITH BIOPSY, SINGLE OR MULTIPLE;  Surgeon: Vickii Chafe, MD;  [facility];  Service: Gastroenterology   ??? PR UPPER GI ENDOSCOPY,DIAGNOSIS N/A 11/10/2012   ?? Procedure: UGI ENDO, INCLUDE ESOPHAGUS, STOMACH, & DUODENUM &/OR JEJUNUM; DX W/WO COLLECTION SPECIMN, BY BRUSH OR WASH;  Surgeon: Malcolm Metro, MD;  [facility];  Service: Gastroenterology   ??? PR UPPER GI ENDOSCOPY,DIAGNOSIS N/A 07/21/2013   ?? Procedure: UGI ENDO, INCLUDE ESOPHAGUS, STOMACH, & DUODENUM &/OR JEJUNUM; DX W/WO COLLECTION SPECIMN, BY BRUSH OR WASH;  Surgeon: Gwen Pounds, MD;  [facility];  Service: Gastroenterology   ??? PR UPPER GI ENDOSCOPY,DIAGNOSIS N/A 07/15/2014   ?? Procedure: UGI ENDO, INCLUDE ESOPHAGUS, STOMACH, & DUODENUM &/OR JEJUNUM; DX W/WO COLLECTION SPECIMN, BY BRUSH OR WASH;  Surgeon: Janyth Pupa, MD;  [facility]: [facility];  Service: Gastroenterology      ??  Current??Medications          Current Outpatient Medications   Medication Sig Dispense Refill   ??? cholestyramine (QUESTRAN) 4 gram packet Take 1 packet by mouth Three (3) times a day with a meal. 1 packet by mouth 3x daily before meals. Don't take within 2hrs of other meds 60 each 2   ??? dronabinol (MARINOL) 5 MG capsule Take 1 capsule (5 mg total) by mouth Two (2) times a day (30 minutes before a meal). 60 capsule 5   ??? famotidine (PEPCID) 20 MG tablet Take 20 mg by mouth two (2) times a day as needed for heartburn. ?? ??   ??? gabapentin (NEURONTIN) 300 MG capsule Start with 300-300-600 x 1 week, then 600-300-600 x 1 week, then 600-600-600 if well tolerated 180 capsule 1   ??? hyoscyamine (ANASPAZ,LEVSIN) 0.125 mg tablet TAKE 1 TABLET BY MOUTH EVERY 4 HOURS AS NEEDED FOR CRAMPING 30 tablet 11   ??? multivitamins, therapeutic with minerals tablet Take 1 tablet by mouth daily. 30 tablet 1   ??? oxyCODONE-acetaminophen (PERCOCET) 10-325 mg per tablet Take 1 tablet by mouth every eight (8) hours as needed for pain.  ?? ??   ??? polyethylene glycol (MIRALAX) 17 gram packet MIX 14 PACKETS IN 64 OUNCES GATORADE. DRINK 8 OUNCES EVERY 15 MIN UNTIL GONE. START AT 11 AM. ON THE DAY BEFORE SURGERY 14 g 0   ??? promethazine (PHENERGAN) 12.5 MG tablet TAKE 1 TABLET BY MOUTH EVERY 8 HOURS AS NEEDED FOR NAUSEA FOR  UP  TO  7  DAYS 15 tablet 3   ??? bisacodyl (DULCOLAX) 5 mg EC tablet TAKE 4 TABLETS (20 MG) BY MOUTH ONCE WITH 2 8 OUNCES GLASSES OF CLEAR LIQUID AT 9 AM. ON THE DAY BEFORE SURGERY (Patient not taking: Reported on 09/12/2017) 25 tablet 0   ??? desipramine (NORPRAMIN) 50 MG tablet Take 1 tablet (50 mg total) by mouth daily. 30 tablet 2   ??? erythromycin base (E-MYCIN) 250 MG tablet TAKE 4 TABLETS (1000MG  TOTAL) BY MOUTH 3 TIMES DAILY FOR 3 DOSES TAKE AT 2:00 P.M. 3:00 P.M. AND 10:00 P.M. THE  DAY PRIOR TO SURGERY (Patient not taking: Reported on 09/12/2017) 12 tablet 0   ??? sildenafil (VIAGRA) 50 MG tablet Take 1 tablet (50 mg total) by mouth daily as needed for erectile dysfunction. 10 tablet 11   ??  No current facility-administered medications for this visit.       ??         Allergies  Reviewed at 1:17 PM   ??   Reactions Comment   ?? Vicodin [hydrocodone-acetaminophen] Anxiety, Other (See Comments) anxious   ?? Tramadol Itching Makes patient anxious and nervous. States he gets restless and has itching with tramadol   ?? Dilaudid [hydromorphone] Anxiety Anxiety   ?? Morphine Itching ??   ?? Opioids - Morphine Analogues Itching High doses   ??   ??  Family??History         Family History   Problem Relation Age of Onset   ??? Hyperlipidemia Father ??? Cancer Maternal Aunt ??   ??? No Known Problems Mother ??   ??? No Known Problems Sister ??   ??? No Known Problems Brother ??   ??? No Known Problems Maternal Uncle ??   ??? No Known Problems Paternal Aunt ??   ??? No Known Problems Paternal Uncle ??   ??? No Known Problems Maternal Grandmother ??   ??? No Known Problems Maternal Grandfather ??   ??? No Known Problems Paternal Grandmother ??   ??? No Known Problems Paternal Grandfather ??   ??? Anesthesia problems Neg Hx ??   ??? Broken bones Neg Hx ??   ??? Clotting disorder Neg Hx ??   ??? Collagen disease Neg Hx ??   ??? Diabetes Neg Hx ??   ??? Dislocations Neg Hx ??   ??? Fibromyalgia Neg Hx ??   ??? Gout Neg Hx ??   ??? Hemophilia Neg Hx ??   ??? Osteoporosis Neg Hx ??   ??? Rheumatologic disease Neg Hx ??   ??? Scoliosis Neg Hx ??   ??? Severe sprains Neg Hx ??   ??? Sickle cell anemia Neg Hx ??   ??? Spinal Compression Fracture Neg Hx ??      ??  Social History   ??        Tobacco Use   ??? Smoking status: Former Smoker   ?? ?? Packs/day: 1.00   ?? ?? Years: 18.00   ?? ?? Pack years: 18.00   ?? ?? Types: Cigarettes   ?? ?? Start date: 08/27/2003   ?? ?? Last attempt to quit: 06/06/2016   ?? ?? Years since quitting: 1.2   ??? Smokeless tobacco: Never Used   ??? Tobacco comment: Pt smokes 1ppd, Pt is interested in tobacco cessation    Substance Use Topics   ??? Alcohol use: No   ??? Drug use: Yes   ?? ?? Types: Marijuana   ??  ??  OBJECTIVE:   VITAL SIGNS: BP 110/68  - Pulse 68  - Temp 36.7 ??C (Oral)  - Wt 55.5 kg (122 lb 4.8 oz)  - BMI 17.06 kg/m??   ??      Wt Readings from Last 6 Encounters:   09/12/17 55.5 kg (122 lb 4.8 oz)   05/09/17 53.3 kg (117 lb 9.6 oz)   03/21/17 59 kg (130 lb)   03/14/17 57.5 kg (126 lb 12.8 oz)   01/09/17 53.1 kg (117 lb 1.6 oz)   11/25/16 58.5 kg (129 lb)   ??  ??  PHYSICAL EXAM:  General appearance:  mild distress and chronically ill appearing.  Eyes: Anicteric sclera. No erythema.  ENT: No oral ulcers. Posterior oropharynx unremarkable.  Cardiovascular: RRR without murmurs, heaves, or thrills. No lower extremity edema.  Pulmonary: Normal work of breathing. Acyanotic.  Abdominal: soft, nontender, nondistended, no masses or organomegaly.  Musculoskeletal: No temporal wasting. Normal joints of the hand.  Skin: No jaundice. No rashes.  Neurologic: Alert, oriented, and appropriate.  Psychiatric: Appropriate.  ??  GI PROCEDURES:  Esophagogastroduodenoscopy (EGD) - 03/21/2017 - The examined portion of the ileum was normal. - Patent end-to-side ileo-colonic anastomosis, characterized by healthy appearing mucosa except one single aphteous lesion. Rutgeerts i1. - One 2 mm polyp in the ascending colon, removed with a jumbo cold forceps. Resected and retrieved. - The examination was otherwise normal.??Overall no significant recurrence of Crohn's disease.  ??  Colonoscopy - 08/09/2016 - The perianal and digital rectal examinations were normal. There was evidence of a prior end-to-side ileo-colonic anastomosis in the ascending colon. This was non-patent and was characterized by erythema, friable mucosa, inflammation and ulceration. The anastomosis could not be traversed. The exam was otherwise without abnormality on direct and retroflexion views. The colonic mucosa was normal throughout.  ??  EGD/Colonoscopy - 07/15/2014 - Normal esophagus. - Normal stomach. - Normal examined duodenum. Aphtha in the sigmoid colon and in the descending colon. - Patent end-to-end ileo-colonic anastomosis, characterized by congestion, edema, erythema, friable mucosa, a hemorrhagic appearance, inflammation and mild stenosis. Dilated. Biopsied. - Aphtha in the distal ileum. - The examination was otherwise normal. - Non-bleeding internal hemorrhoids.  ??  EGD/Colonoscopy - 07/21/2013 - Normal esophagus. - Normal stomach. - Normal examined duodenum. - No specimens collected. Non-thrombosed internal hemorrhoids found on perianal exam. - The entire examined colon is normal. - Patent functional end-to-end ileo-colonic anastomosis, characterized by healthy appearing mucosa. Rutgeert's stage I0. - The examined portion of the ileum was normal for 15 cm. - Internal hemorrhoids.  ??  EGD/Colonoscopy - 11/10/2012 - Normal esophagus. - Normal examined duodenum. - Hiatus hernia. Patent end-to-side ileo-colonic anastomosis. - Crohn's disease with ileitis of distal 4 cm of neoterminal ileum. - The entire examined colon is normal.  ??  EGD/Colonoscopy -  07/23/2012 - LA Grade B esophagitis.- Erythematous mucosa in the prepyloric region of the stomach. Biopsied. - Duodenitis. Biopsied. - Normal 2nd part of the duodenum. The entire examined colon is normal. - End-to-side ileo-colonic anastomosis with inflammation on the neoterminal side with stenosis of efferent and afferent limbs. This was not traversed. This was biopsied.  ??  RADIOGRAPHIC STUDIES:  CT abdomen and pelvis with contrast - 03/13/2017 - Status post ileocecectomy with ileocolic anastomosis. No dilated or thick-walled loops of bowel. No bowel obstruction. No ascites. No acute intra-abdominal abnormality.  ??  Ct Abdomen Pelvis W Iv Contrast Only - 08/08/2016 - Short segment of bowel wall thickening at the distal ileum just prior to the ileocolic anastomosis. This could represent a Crohn's flare as clinically questioned.     LABORATORY RESULTS:  CBC:          Lab Results   Component Value Date   ?? WBC 7.8 09/12/2017   ?? WBC 8.9 05/09/2017   ?? WBC 11.2 (H) 03/12/2017   ?? WBC 10.3 07/21/2013   ?? WBC 10.4 07/20/2013   ?? WBC 6.9 07/19/2013   ?? NEUTROABS 5.5 09/12/2017   ?? NEUTROABS 8.6 (H) 03/12/2017   ?? NEUTROABS 4.6 01/07/2017   ?? NEUTROABS 9.0 (H) 07/21/2013   ?? NEUTROABS 9.1 (H) 07/20/2013   ??  NEUTROABS 5.7 07/19/2013   ?? HGB 13.0 (L) 09/12/2017   ?? HGB 13.0 (L) 05/09/2017   ?? HGB 12.0 (L) 03/12/2017   ?? HGB 12.5 (L) 07/21/2013   ?? HGB 11.8 (L) 07/20/2013   ?? HGB 11.9 (L) 07/19/2013   ?? PLT 349 09/12/2017   ?? PLT 294 05/09/2017   ?? PLT 365 03/12/2017   ?? PLT 176 07/21/2013   ?? PLT 205 07/20/2013   ?? PLT 191 07/19/2013   ??  BMP:          Lab Results   Component Value Date   ?? NA 133 (L) 09/12/2017   ?? NA 138 05/09/2017   ?? NA 140 03/12/2017   ?? NA 130 (L) 07/21/2013   ?? NA 131 (L) 07/20/2013   ?? NA 133 (L) 07/19/2013   ?? K 4.5 09/12/2017   ?? K 4.3 05/09/2017   ?? K 4.2 03/12/2017   ?? K 4.2 07/21/2013   ?? K 4.3 07/20/2013   ?? K 5.3 (H) 07/19/2013   ?? CL 96 (L) 09/12/2017   ?? CL 103 05/09/2017   ?? CL 102 03/12/2017   ?? CL 102 07/21/2013   ?? CL 103 07/20/2013   ?? CL 106 07/19/2013   ?? CO2 23.0 09/12/2017   ?? CO2 24.0 05/09/2017   ?? CO2 25.0 03/12/2017   ?? CO2 23 07/21/2013   ?? CO2 25 07/20/2013   ?? CO2 21 (L) 07/19/2013   ?? BUN 16 09/12/2017   ?? BUN 14 05/09/2017   ?? BUN 13 03/12/2017   ?? BUN 7 07/21/2013   ?? BUN 7 07/20/2013   ?? BUN 9 07/19/2013   ?? CREATININE 0.94 09/12/2017   ?? CREATININE 0.90 05/09/2017   ?? CREATININE 0.88 03/12/2017   ?? CREATININE 0.78 07/21/2013   ?? CREATININE 0.76 07/20/2013   ?? CREATININE 0.83 07/19/2013   ?? BCR 17 09/12/2017   ?? BCR 16 05/09/2017   ?? BCR 15 03/12/2017   ?? BCR 9 07/21/2013   ?? BCR 9 07/20/2013   ?? BCR 11 07/19/2013   ?? GLU 101 09/12/2017   ?? GLU 73 05/09/2017   ?? GLU 83 03/12/2017   ?? GLU 89 07/21/2013   ?? GLU 93 07/20/2013   ?? GLU 93 07/19/2013   ?? CALCIUM 9.7 09/12/2017   ?? CALCIUM 9.7 05/09/2017   ?? CALCIUM 10.2 03/12/2017   ?? CALCIUM 8.7 07/21/2013   ?? CALCIUM 8.5 07/20/2013   ?? CALCIUM 8.3 (L) 07/19/2013   ??  Liver panel:          Lab Results   Component Value Date   ?? PT 18.1 (H) 09/25/2016   ?? PT 11.7 05/21/2010   ?? INR 1.59 09/25/2016   ?? INR 1.1 05/21/2010   ?? ALBUMIN 4.8 09/12/2017   ?? ALBUMIN 4.8 05/09/2017   ?? ALBUMIN 5.0 03/12/2017   ?? ALBUMIN 3.9 07/18/2013   ?? ALBUMIN 4.4 07/14/2013   ?? ALBUMIN 3.8 06/07/2013   ?? PROT 8.1 09/12/2017   ?? PROT 8.1 05/09/2017   ?? PROT 8.0 03/12/2017   ?? PROT 6.5 (L) 07/18/2013   ?? PROT 7.5 07/14/2013   ?? PROT 6.6 06/07/2013   ?? BILITOT 1.3 (H) 09/12/2017   ?? BILITOT 1.0 05/09/2017   ?? BILITOT 1.1 03/12/2017   ?? BILITOT 0.9 07/18/2013   ?? BILITOT 1.0 07/14/2013 BILITOT 0.9 06/07/2013   ?? BILIDIR <0.10 10/23/2016   ?? BILIDIR 0.20 10/05/2016   ??  BILIDIR 0.30 09/28/2016   ?? BILIDIR 0.4 06/07/2013   ?? AST 29 09/12/2017   ?? AST 25 05/09/2017   ?? AST 21 03/12/2017   ?? AST 26 07/18/2013   ?? AST 27 07/14/2013   ?? AST 30 06/07/2013   ?? ALT 17 (L) 09/12/2017   ?? ALT 29 05/09/2017   ?? ALT 22 03/12/2017   ?? ALT 28 07/18/2013   ?? ALT 18 (L) 07/14/2013   ?? ALT 17 (L) 06/07/2013   ?? ALKPHOS 65 09/12/2017   ?? ALKPHOS 66 05/09/2017   ?? ALKPHOS 78 03/12/2017   ?? ALKPHOS 87 07/18/2013   ?? ALKPHOS 79 07/14/2013   ?? ALKPHOS 61 06/07/2013   ??  Anemia:        MCV   Date Value Ref Range Status   09/12/2017 98.3 80.0 - 100.0 fL Final   07/21/2013 99 80 - 100 fL Final   ??        RDW   Date Value Ref Range Status   09/12/2017 13.9 12.0 - 15.0 % Final   07/21/2013 13.2 12.0 - 15.0 % Final   ??  Ferritin   Date Value Ref Range Status   01/08/2013 92 27 - 377 ng/mL Final   ??        Iron Saturation (%)   Date Value Ref Range Status   01/08/2013 33 20 - 50 % Final   ??        TIBC   Date Value Ref Range Status   01/08/2013 309 252 - 479 ug/dL Final   ??        Folate   Date Value Ref Range Status   10/24/2010 8.0 2.7 - 20.0 NG/ML Final   ??        Vitamin B-12   Date Value Ref Range Status   05/09/2017 378 193 - 900 pg/ml Final   07/30/2011 392 193 - 900 PG/ML Final   ??  Luminal etiologies:        IgA   Date Value Ref Range Status   01/15/2011 443 (H) 40 - 400 MG/DL Final   ??        CRP   Date Value Ref Range Status   09/12/2017 12.5 (H) <10.0 mg/L Final   07/18/2013 0.7 0.0 - 1.0 mg/dL Final   ??        Sed Rate   Date Value Ref Range Status   03/12/2017 18 (H) 0 - 15 mm/h Final   07/18/2013 6 0 - 15 mm/h Final   ??  Cortisol   Date Value Ref Range Status   05/21/2010 6.9 MCG/DL Final   ?? ?? Comment:   ?? ?? :  Before 10AM: 4.5-22.7 ug/dl  After  5PM: 9.5-62.1 ug/dl   ??        TSH   Date Value Ref Range Status   01/15/2011 0.51 (L) 0.60 - 3.30 MICROIU/ML Final   ??        Free T4   Date Value Ref Range Status 01/15/2011 1.01 0.71 - 1.40 NG/DL Final   ??  Liver etiologies:        Hep B S Ab   Date Value Ref Range Status   07/23/2012 Nonreactive ?? Final   ??          Hepatitis C Ab   Date Value Ref Range Status   07/23/2012 Negative ?? Final   ?? ?? Comment:   ?? ?? Antibodies  to HCV were NOT detected.  A nonreactive result  does not exclude the possibility of exposure to HCV.   ??  ASSESSMENT AND PLAN:   ??  Rodney Bender is a 47 y.o. man with history significant for ileal stricturing Crohn's disease s/p four ileocecal resections with a long history of adherence issues and fractured care across institutions who presents for routine follow-up.  ??  Abdominal pain, nausea, and vomiting: I do not think active Crohn's is likely given his lab profile and exam. He (and his wife) say he is adhering to the [facility]. I think infection is a more likely cause. I would consider C Diff given the clarithromycin, but given his wife also got sick consider a viral gastroenteritis, which could be prolonged by his immunosuppression, anatomic status, and history of functional overlay to his Crohn's disease. I discussed imaging with Dr. Raphael Gibney, we do not think this is indicated at present. We would treat supportively bending his stool labs for infection. If he had a viral infection, supportive care, and if C diff I would treat with po vancomyin according to standard IDSA guidelines.  - Continue prn antiemetics  - Continue moderate volume resuscitation based on exam and VS  - F/u C Diff and enteric pathogen panel, PO vancomycin if C Diff positive    This is not a formal consult, but I care for the patient in GI clinic and saw him in person, and discussed him with IBD specialist Dr. Raphael Gibney. A formal consult can be made if desired to the GI medicine service, or feel free to reach out to me at page [phone]. I will check in on the patient tomorrow morning if he stays the night.    Rodney Oris, MD  Gastroenterology and Hepatology Fellow (PGY-4) [email or web address] (patient's providers should feel free to reach out with any questions whatsoever)

## 2017-12-10 NOTE — Unmapped (Signed)
Assessment/Plan:     Principal Problem:    Clostridium difficile enteritis  Active Problems:    Crohn's disease of both small and large intestine with intestinal obstruction (CMS-HCC)    Abdominal pain, chronic, right lower quadrant  Resolved Problems:    * No resolved hospital problems. *      Rodney Bender is a 47 y.o. with past medical history of Crohn's disease who presented to [facility] with Clostridium difficile enteritis.     Clostridium difficile enteritis: Patient C. difficile toxin is positive.  He had recently had a course of antibiotics when he had a tooth pulled about a month ago which preceded his current illness.  Suspect that this is the cause.   -Normal saline 100 mL/h   -Phenergan and Zofran as needed for nausea   -Home oxycodone and as needed morphine as needed for pain   -Vancomycin 125 mg p.o. every 6 hours for 10 days   -Enteric precautions    Chronic Medical Conditions:  Crohn's disease -stable  GERD -continue Pepcid      FEN/GI: Regular diet and NS @ 100/hr    DVT prophylaxis: Ambulation    Code Status: Prior    DISPOSITION LIST:  [ ]  Anticipated Discharge Location: Home  [ ]  PT/OT/DME: No needs anticipated  [ ]  CM/SW needs: None anticipated  [ ]  Meds/Rx:  Not yet prescribed. No special med needs  [ ]  Teaching: None anticipated  [ ]  Follow up appt: Appt needed  [ ]  Excuse letter: None anticipated  [ ]  Transport: Private Needed      Admitting Provider Signature:  Gracelyn Nurse, MD  Obs 2 Attending  Division of Hospital Medicine  December 09, 2017 5:04 PM    ______________________________________________________________________  Chief Complaint: Diarrhea      History of Present Illness:     Rodney Bender is a 47 y.o. man with a history of Crohn's disease.  He says he was feeling well until 4 weeks ago.  At that time he had problems with a tooth infection.  He had his tooth extracted by dentist and was given a course of antibiotics.  He is not sure the name of the antibiotic, but when I said clindamycin he thought that might of been it.  He took it for a few days (although he was given a month prescription).  After that his tooth is been feeling better and so he stopped.  However around that time he began to experience diarrhea, nausea, vomiting, poor appetite and abdominal pain.  Symptoms have progressed he is having 7-8 bowel movements per day.  Generally this is dark and watery diarrhea without any blood.  His pain has become really miserable and so he presented to the emergency department today for further evaluation and treatment.       In the ED a C. difficile toxin was positive and the patient is referred to me for further treatment in the observation unit.    Allergies:   Vicodin [hydrocodone-acetaminophen]; Tramadol; Dilaudid [hydromorphone]; Morphine; and Opioids - morphine analogues      Medications:     Prior to Admission medications    Medication Dose, Route, Frequency   cholestyramine (QUESTRAN) 4 gram packet 1 packet, Oral, 3 times a day (with meals), 1 packet by mouth 3x daily before meals. Don't take within 2hrs of other meds   desipramine (NORPRAMIN) 50 MG tablet 50 mg, Oral, Daily (standard)   dronabinol (MARINOL) 10 MG capsule 10 mg, Oral,  2 times a day (AC)   empty container Misc Use as directed   famotidine (PEPCID) 20 MG tablet 20 mg, Oral, 2 times a day PRN   gabapentin (NEURONTIN) 300 MG capsule Start with 300-300-600 x 1 week, then 600-300-600 x 1 week, then 600-600-600 if well tolerated   hyoscyamine (ANASPAZ,LEVSIN) 0.125 mg tablet TAKE 1 TABLET BY MOUTH EVERY 4 HOURS AS NEEDED FOR CRAMPING   multivitamins, therapeutic with minerals tablet 1 tablet, Oral, Daily (standard)   oxyCODONE-acetaminophen (PERCOCET) 10-325 mg per tablet 1 tablet, Oral, Every 8 hours PRN   promethazine (PHENERGAN) 12.5 MG tablet 25 mg, Oral, Every 6 hours PRN   sildenafil (VIAGRA) 50 MG tablet 50 mg, Oral, Daily PRN   ustekinumab (STELARA) 90 mg/mL Syrg syringe 90 mg, Subcutaneous, every 8 weeks         Past Medical History:     Past Medical History:   Diagnosis Date   ??? Crohn's disease (CMS-HCC)     diagnosed in 1990   ??? GERD (gastroesophageal reflux disease)    ??? Hypertension 10/08/2016         Past Surgical History:      Past Surgical History:   Procedure Laterality Date   ??? COLON SURGERY     ??? PR COLONOSCOPY FLX DX W/COLLJ SPEC WHEN PFRMD Left 11/10/2012    Procedure: COLONOSCOPY, FLEXIBLE, PROXIMAL TO SPLENIC FLEXURE; DIAGNOSTIC, W/WO COLLECTION SPECIMEN BY BRUSH OR WASH;  Surgeon: Malcolm Metro, MD;  [facility];  Service: Gastroenterology   ??? PR COLONOSCOPY FLX DX W/COLLJ SPEC WHEN PFRMD N/A 07/21/2013    Procedure: COLONOSCOPY, FLEXIBLE, PROXIMAL TO SPLENIC FLEXURE; DIAGNOSTIC, W/WO COLLECTION SPECIMEN BY BRUSH OR WASH;  Surgeon: Gwen Pounds, MD;  [facility];  Service: Gastroenterology   ??? PR COLONOSCOPY FLX DX W/COLLJ SPEC WHEN PFRMD N/A 08/09/2016    Procedure: COLONOSCOPY, FLEXIBLE, PROXIMAL TO SPLENIC FLEXURE; DIAGNOSTIC, W/WO COLLECTION SPECIMEN BY BRUSH OR WASH;  Surgeon: Janyth Pupa, MD;  Location: [facility];  Service: Gastroenterology   ??? PR COLONOSCOPY W/BIOPSY SINGLE/MULTIPLE  07/23/2012    Procedure: COLONOSCOPY, FLEXIBLE, PROXIMAL TO SPLENIC FLEXURE; WITH BIOPSY, SINGLE OR MULTIPLE;  Surgeon: Vickii Chafe, MD;  [facility]: [facility];  Service: Gastroenterology   ??? PR COLONOSCOPY W/BIOPSY SINGLE/MULTIPLE N/A 07/15/2014    Procedure: COLONOSCOPY, FLEXIBLE, PROXIMAL TO SPLENIC FLEXURE; WITH BIOPSY, SINGLE OR MULTIPLE;  Surgeon: Janyth Pupa, MD;  [facility]: [facility];  Service: Gastroenterology   ??? PR COLONOSCOPY W/BIOPSY SINGLE/MULTIPLE N/A 03/21/2017    Procedure: COLONOSCOPY, FLEXIBLE, PROXIMAL TO SPLENIC FLEXURE; WITH BIOPSY, SINGLE OR MULTIPLE;  Surgeon: Modena Nunnery, MD;  Location: [facility] [place] [facility];  Service: Gastroenterology   ??? PR COLSC FLEXIBLE W/TRANSENDOSCOPIC BALLOON DILAT N/A 07/15/2014    Procedure: COLONOSCOPY, FLEXIBLE; WITH DILATION BY BALLOON, 1 OR MORE STRICTURES;  Surgeon: Janyth Pupa, MD;  [facility];  Service: Gastroenterology   ??? PR REMVL COLON & TERM ILEUM W/ILEOCOLOSTOMY N/A 09/16/2016    Procedure: COLECTOMY, PARTIAL, WITH REMOVAL OF TERMINAL ILEUM WITH ILEOCOLOSTOMY;  Surgeon: Lady Gary, MD;  Location: MAIN OR [place];  Service: Gastrointestinal   ??? PR UPPER GI ENDOSCOPY,BIOPSY N/A 07/23/2012    Procedure: UGI ENDOSCOPY; WITH BIOPSY, SINGLE OR MULTIPLE;  Surgeon: Vickii Chafe, MD;  [facility];  Service: Gastroenterology   ??? PR UPPER GI ENDOSCOPY,DIAGNOSIS N/A 11/10/2012    Procedure: UGI ENDO,  INCLUDE ESOPHAGUS, STOMACH, & DUODENUM &/OR JEJUNUM; DX W/WO COLLECTION SPECIMN, BY BRUSH OR WASH;  Surgeon: Malcolm Metro, MD;  [facility]: [facility];  Service: Gastroenterology   ??? PR UPPER GI ENDOSCOPY,DIAGNOSIS N/A 07/21/2013    Procedure: UGI ENDO, INCLUDE ESOPHAGUS, STOMACH, & DUODENUM &/OR JEJUNUM; DX W/WO COLLECTION SPECIMN, BY BRUSH OR WASH;  Surgeon: Gwen Pounds, MD;  [facility];  Service: Gastroenterology   ??? PR UPPER GI ENDOSCOPY,DIAGNOSIS N/A 07/15/2014    Procedure: UGI ENDO, INCLUDE ESOPHAGUS, STOMACH, & DUODENUM &/OR JEJUNUM; DX W/WO COLLECTION SPECIMN, BY BRUSH OR WASH;  Surgeon: Janyth Pupa, MD;  [facility];  Service: Gastroenterology         Social History:     Social History     Socioeconomic History   ??? Marital status: Single     Spouse name: Not on file   ??? Number of children: Not on file   ??? Years of education: Not on file   ??? Highest education level: Not on file   Occupational History   ??? Not on file   Social Needs   ??? Financial resource strain: Not on file   ??? Food insecurity:     Worry: Not on file     Inability: Not on file   ??? Transportation needs:     Medical: Not on file     Non-medical: Not on file   Tobacco Use   ??? Smoking status: Former Smoker     Packs/day: 1.00     Years: 18.00     Pack years: 18.00     Types: Cigarettes     Start date: 08/27/2003     Last attempt to quit: 06/06/2016     Years since quitting: 1.5   ??? Smokeless tobacco: Never Used   ??? Tobacco comment: Pt smokes 1ppd, Pt is interested in tobacco cessation    Substance and Sexual Activity   ??? Alcohol use: No   ??? Drug use: Yes     Types: Marijuana   ??? Sexual activity: Yes     Partners: Female     Birth control/protection: Condom   Lifestyle   ??? Physical activity:     Days per week: Not on file     Minutes per session: Not on file   ??? Stress: Not on file   Relationships   ??? Social connections:     Talks on phone: Not on file     Gets together: Not on file     Attends religious service: Not on file     Active member of club or organization: Not on file     Attends meetings of clubs or organizations: Not on file     Relationship status: Not on file   Other Topics Concern   ??? Not on file   Social History Narrative   ??? Not on file         Family History:     Family History   Problem Relation Age of Onset   ??? Hyperlipidemia Father    ??? Cancer Maternal Aunt    ??? No Known Problems Mother    ??? No Known Problems Sister    ??? No Known Problems Brother    ??? No Known Problems Maternal Uncle    ??? No Known Problems Paternal Aunt    ??? No Known Problems Paternal Uncle    ??? No Known Problems Maternal Grandmother    ???  No Known Problems Maternal Grandfather    ??? No Known Problems Paternal Grandmother    ??? No Known Problems Paternal Grandfather    ??? Anesthesia problems Neg Hx    ??? Broken bones Neg Hx    ??? Clotting disorder Neg Hx    ??? Collagen disease Neg Hx    ??? Diabetes Neg Hx    ??? Dislocations Neg Hx    ??? Fibromyalgia Neg Hx    ??? Gout Neg Hx    ??? Hemophilia Neg Hx    ??? Osteoporosis Neg Hx    ??? Rheumatologic disease Neg Hx    ??? Scoliosis Neg Hx    ??? Severe sprains Neg Hx    ??? Sickle cell anemia Neg Hx    ??? Spinal Compression Fracture Neg Hx          Review of Systems:   10 systems reviewed and are negative unless otherwise mentioned in HPI      Physical Exam:   Temp:  [36.4 ??C (97.5 ??F)] 36.4 ??C (97.5 ??F)  Heart Rate:  [74] 74  SpO2 Pulse:  [65] 65  Resp:  [16] 16  BP: (120-132)/(80-88) 120/80  SpO2:  [96 %-99 %] 96 %    GENERAL: Pleasant thin middle-aged man lying on the ER stretcher on his right side.  He appears miserable but in no respiratory distress.  Alert and oriented to name, place, date.    HEENT:  Extraoccular muscles intact, pupils equally round and reactive to light.  Mucous membranes dry, oropharynx clear  NECK:  Supple, no cervical or clavicular lymphadenopathy  CHEST:  Clear to auscultation.  Good air movement, no wheezes rales or rhonchi  HEART:  Regular rate and rhythm.  Normal S1 and S2.  No murmurs.  ABDOMEN:  Soft, diffusely tender to palpation throughout, non-distended.  Normal active bowel sounds.  No rebound or guarding.  No masses or organomegaly detected.  EXTREMITIES:  No lower extremity edema.  No foot ulcers or sores.  SKIN:  No rashes or lesions  MUSCULOSKELETAL:  No joint effusions or tenderness.  VASCULAR:  2+ Dorsalis pedis and radial pulses bilaterally          Labs/Studies/Diagnostics:   Labs and Studies from the last 24hrs per EMR and Reviewed  Notable for white blood cell count is 10.2 and creatinine is 0.84.  C. difficile toxin was positive.

## 2017-12-10 NOTE — Unmapped (Signed)
Problem: Adult Inpatient Plan of Care  Goal: Plan of Care Review  Outcome: Progressing  Goal: Patient-Specific Goal (Individualization)  Outcome: Progressing  Goal: Absence of Hospital-Acquired Illness or Injury  Outcome: Progressing  Goal: Optimal Comfort and Wellbeing  Outcome: Progressing  Goal: Readiness for Transition of Care  Outcome: Progressing  Goal: Rounds/Family Conference  Outcome: Progressing     Problem: Infection  Goal: Infection Symptom Resolution  Outcome: Progressing   Pt updated regarding plan of care. Medications administered per [facility] order. Pain controlled with PRN meds per MAR. Isolation precautions maintained. Call bell within reach, bed in low position, wheels locked, alarm on.  Vitals stable, No other needs/issues noted.  Will continue to monitor.

## 2017-12-10 NOTE — Unmapped (Signed)
Care Management  Initial Transition Planning Assessment    Per Medical Record Review  Rodney Bender is a 47 y.o. with past medical history of Crohn's disease who presented to [facility] with Clostridium difficile enteritis.   ??  Clostridium difficile enteritis: Patient C. difficile toxin is positive.  He had recently had a course of antibiotics when he had a tooth pulled about a month ago which preceded his current illness.  Suspect that this is the cause.    Chronic Medical Conditions:  Crohn's disease -stable  GERD -continue Pepcid              General  Care Manager assessed the patient by : In person interview with patient, Medical record review, Discussion with Clinical Care team  Orientation Level: Oriented X4  Who provides care at home?: N/A    Contact/Decision Maker  Patient:  [phone]    Extended Emergency Contact Information  Primary Emergency Contact: Moore,Bernadette   [place]  Mobile Phone: [phone]  Relation: Spouse    Legal Next of Kin / Guardian / POA / Advance Directives   Patient able to make his own decision.  Patient does not have advanced directives.  He states preference for his wife to be his designated [facility] DM if needed.     Advance Directive (Medical Treatment)  Does patient have an advance directive covering medical treatment?: Patient does not have advance directive covering medical treatment., Patient would not like information.  Reason patient does not have an advance directive covering medical treatment:: Patient does not wish to complete one at this time  Reason there is not a Health Care Decision Maker appointed:: Patient does not wish to appoint a Health Care Decision Maker at this time  Information provided on advance directive:: No  Patient requests assistance:: No    Advance Directive (Mental Health Treatment)  Does patient have an advance directive covering mental health treatment?: Patient does not have advance directive covering mental health treatment., Patient would not like information.  Reason patient does not have an advance directive covering mental health treatment:: Patient does not wish to complete one at this time.    Patient Information  Medical Provider(s): [facility]  Reason for Admission: Admitting Diagnosis:  c-diff enteritis  Past Medical History:   has a past medical history of Crohn's disease (CMS-HCC), GERD (gastroesophageal reflux disease), and Hypertension (10/08/2016).  Past Surgical History:   has a past surgical history that includes Colon surgery; pr upper gi endoscopy,biopsy (N/A, 07/23/2012); pr colonoscopy w/biopsy single/multiple (07/23/2012); pr colonoscopy flx dx w/collj spec when pfrmd (Left, 11/10/2012); pr upper gi endoscopy,diagnosis (N/A, 11/10/2012); pr upper gi endoscopy,diagnosis (N/A, 07/21/2013); pr colonoscopy flx dx w/collj spec when pfrmd (N/A, 07/21/2013); pr upper gi endoscopy,diagnosis (N/A, 07/15/2014); pr colsc flexible w/transendoscopic balloon dilat (N/A, 07/15/2014); pr colonoscopy w/biopsy single/multiple (N/A, 07/15/2014); pr colonoscopy flx dx w/collj spec when pfrmd (N/A, 08/09/2016); 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   Previous admit date: 01/08/2017    Primary Insurance- Payor: MEDICARE / Plan: MEDICARE PART A AND PART B / Product Type: *No Product type* /   Secondary Insurance ??? Secondary Insurance  MEDICAID [place]  Prescription Coverage ??? Medicaid  Preferred Pharmacy - [facility]  [facility] - [place] ([place] - [address]  [facility]    Transportation home: Private Vehicle - wife will provide  Level of function prior to admission: Independent    Lives with: Spouse/significant other    Type of Residence: Private residence    Address:  [address]   [place], [place] [address]  [facility])    Support Systems: Spouse, Family Members    Responsibilities/Dependents at home?: No    Home Care services in place prior to admission?: No    Outpatient/Community Resources in place prior to admission: Clinic  Agency detail (Name/Phone #): [facility]    Equipment Currently Used at Home: none    Currently receiving outpatient dialysis?: No    Financial Information    Need for financial assistance?: No(patient is on disability.  Wife does work. )    Social Determinants of Health  Social Determinants of Health were addressed in provider documentation.  Please refer to patient history.    Discharge Needs Assessment  Concerns to be Addressed: no discharge needs identified, denies needs/concerns at this time    Clinical Risk Factors: Multiple Diagnoses (Chronic)    Barriers to taking medications: No    Prior overnight hospital stay or ED visit in last 90 days: Yes    Readmission Within the Last 30 Days: no previous admission in last 30 days    Anticipated Changes Related to Illness: none    Equipment Needed After Discharge: none    Discharge Facility/Level of Care Needs: other (see comments)(home with self care)    Readmission  Risk of Unplanned Readmission Score:  %  Readmitted Within the Last 30 Days?   Patient at risk for readmission?: Yes    Discharge Plan  Screen findings are: Care Manager reviewed the plan of the patient's care with the Multidisciplinary Team. No discharge planning needs identified at this time. Care Manager will continue to manage plan and monitor patient's progress with the team.    Expected Discharge Date: 12/10/17    Expected Transfer from Critical Care: N/A    Patient and/or family were provided with choice of facilities / services that are available and appropriate to meet post hospital care needs?: N/A     Initial Assessment complete?: Yes     CM will continue to follow for care progression and avoidable delays.     Norva Pavlov, RN, BSN  Care Manager [phone]) December 10, 2017 9:05 AM

## 2017-12-10 NOTE — Unmapped (Signed)
Assessment/Plan:     Principal Problem:    Clostridium difficile enteritis  Active Problems:    Crohn's disease of both small and large intestine with intestinal obstruction (CMS-HCC)    Abdominal pain, chronic, right lower quadrant  Resolved Problems:    * No resolved hospital problems. *      Rodney Bender is a 47 y.o. male that presented to [facility] with Clostridium difficile enteritis.    *C diff enteritis, recently on Clindamycin for dental infection. Feeling a little better this morning.  - continue Vancomycin PO 125 mg QID (11/12-11/22 )  - ADAT  - Phenergan and Zofran as needed for nausea  - home oxycodone and as needed morphine as needed for pain    Chronic Medical Conditions:  *Crohn's disease -stable  *GERD -continue Pepcid  ??  ??  FEN/GI: Regular diet and NS @ 100/hr  ??  DVT prophylaxis: Ambulation  ??  ______________________________________________________________________    Subjective:   No acute events. Feels a little better, stool frequency and consistency is improved. He ate a little breakfast, but does not have much of an appetite. No vomiting. Continues to have abdominal cramping.       Labs/Studies/Diagnostics:   Labs and Studies from the last 24hrs per EMR and Reviewed    Objective:   Temp:  [36.4 ??C (97.5 ??F)-36.7 ??C (98.1 ??F)] 36.7 ??C (98 ??F)  Heart Rate:  [52-74] 59  SpO2 Pulse:  [53-65] 53  Resp:  [16-18] 17  BP: (120-159)/(80-99) 159/99  SpO2:  [96 %-100 %] 100 %    Constitutional: Non toxic appearing  HEENT: conjunctiva clear, anicteric, MMM  CV: Regular rate and rhythm, no murmur   Lung: CTAB   Abdomen: soft, mild lower abdominal tenderness to palpation, no peritoneal signs   Extremities: No lower extremity edema  MSK: No joint swelling or tenderness noted, no deformities  Skin: No rashes, jaundice or skin lesions noted  Neuro: Alert, Oriented x 3, No focal deficits.  Mental Status: Thought organized, appropriate affect, pleasantly interactive, not anxious appearing

## 2017-12-10 NOTE — Unmapped (Signed)
Called report to [facility] on 1 OBS

## 2017-12-11 MED ORDER — OXYCODONE-ACETAMINOPHEN 10 MG-325 MG TABLET
ORAL_TABLET | Freq: Four times a day (QID) | ORAL | 0 refills | 0.00000 days | Status: SS | PRN
Start: 2017-12-11 — End: 2017-12-21

## 2017-12-11 MED ORDER — VANCOMYCIN 25 MG/ML ORAL SUSPENSION
Freq: Four times a day (QID) | ORAL | 0 refills | 0 days | Status: SS
Start: 2017-12-11 — End: 2017-12-21

## 2017-12-11 NOTE — Unmapped (Signed)
No acute events.  BM x 1.  Patient states his abdominal pain is better when compared on arrival.   Appetite/oral hydration good.  Independent with ADLs. VSS.      Problem: Adult Inpatient Plan of Care  Goal: Plan of Care Review  Outcome: Progressing  Goal: Patient-Specific Goal (Individualization)  Outcome: Progressing  Goal: Absence of Hospital-Acquired Illness or Injury  Outcome: Progressing  Goal: Optimal Comfort and Wellbeing  Outcome: Progressing  Goal: Readiness for Transition of Care  Outcome: Progressing  Goal: Rounds/Family Conference  Outcome: Progressing     Problem: Infection  Goal: Infection Symptom Resolution  Outcome: Progressing

## 2017-12-11 NOTE — Unmapped (Signed)
Plan of care reviewed with patient who states understanding.  Patient endorses abdominal pain and nausea, medications per MAR.  Bowel sounds active x4 quadrant and abdomen soft but tender to palpation.  Patient denies bowel movement since 2 days ago.  VS:  BP 151/101  - Pulse 65  - Temp 36.5 ??C (97.7 ??F) (Oral)  - Resp 16  - Ht 180.3 cm (5' 11)  - Wt 57.8 kg (127 lb 6.8 oz)  - SpO2 100%  - BMI 17.77 kg/m??   Patient resting comfortably, eating breakfast, and has no further questions or concerns at this time.  Will continue to monitor.        Problem: Adult Inpatient Plan of Care  Goal: Plan of Care Review  Outcome: Progressing  Flowsheets (Taken 12/11/2017 0952)  Progress: improving  Plan of Care Reviewed With: patient  Goal: Patient-Specific Goal (Individualization)  Outcome: Progressing  Goal: Absence of Hospital-Acquired Illness or Injury  Outcome: Progressing  Goal: Optimal Comfort and Wellbeing  Outcome: Progressing  Goal: Readiness for Transition of Care  Outcome: Progressing  Goal: Rounds/Family Conference  Outcome: Progressing  Flowsheets (Taken 12/11/2017 0952)  Clinical EDD (Estimated Discharge Date) : 12/11/17     Problem: Infection  Goal: Infection Symptom Resolution  Outcome: Progressing     Problem: Pain Acute  Goal: Optimal Pain Control  Outcome: Progressing

## 2017-12-11 NOTE — Unmapped (Signed)
Report received from RN at bedside. Alert and oriented x4; VSS and afebrile. Tolerating reg diet. +Sensation, +pulses. Having pain to abd, provided with prn Morphine IV and Percocet with adequate relief. Fall reduction protocol maintained, pt remains absent falls and injury throughout the shift. Pt able to turn independently. Adequate urine output noted. No s/s of skin breakdown observed. Pt currently resting in bed without complaints, will continue to monitor.     Problem: Adult Inpatient Plan of Care  Goal: Plan of Care Review  Outcome: Progressing  Goal: Patient-Specific Goal (Individualization)  Outcome: Progressing  Goal: Absence of Hospital-Acquired Illness or Injury  Outcome: Progressing  Goal: Optimal Comfort and Wellbeing  Outcome: Progressing  Goal: Readiness for Transition of Care  Outcome: Progressing  Goal: Rounds/Family Conference  Outcome: Progressing     Problem: Infection  Goal: Infection Symptom Resolution  Outcome: Progressing     Problem: Pain Acute  Goal: Optimal Pain Control  Outcome: Progressing

## 2017-12-18 ENCOUNTER — Encounter: Payer: Self-pay | Admitting: Anesthesiology

## 2017-12-18 ENCOUNTER — Ambulatory Visit: Payer: Medicare Other | Attending: Anesthesiology | Admitting: Anesthesiology

## 2017-12-18 ENCOUNTER — Other Ambulatory Visit: Payer: Self-pay

## 2017-12-18 VITALS — BP 158/102 | HR 74 | Temp 98.2°F | Resp 14 | Ht 71.0 in | Wt 130.0 lb

## 2017-12-18 DIAGNOSIS — M542 Cervicalgia: Secondary | ICD-10-CM | POA: Diagnosis not present

## 2017-12-18 DIAGNOSIS — R109 Unspecified abdominal pain: Secondary | ICD-10-CM | POA: Diagnosis not present

## 2017-12-18 DIAGNOSIS — K5090 Crohn's disease, unspecified, without complications: Secondary | ICD-10-CM

## 2017-12-18 DIAGNOSIS — Z79891 Long term (current) use of opiate analgesic: Secondary | ICD-10-CM | POA: Insufficient documentation

## 2017-12-18 DIAGNOSIS — M545 Low back pain: Secondary | ICD-10-CM | POA: Insufficient documentation

## 2017-12-18 DIAGNOSIS — F1190 Opioid use, unspecified, uncomplicated: Secondary | ICD-10-CM | POA: Diagnosis not present

## 2017-12-18 DIAGNOSIS — G8929 Other chronic pain: Secondary | ICD-10-CM

## 2017-12-18 DIAGNOSIS — G894 Chronic pain syndrome: Secondary | ICD-10-CM | POA: Diagnosis not present

## 2017-12-18 DIAGNOSIS — Z79899 Other long term (current) drug therapy: Secondary | ICD-10-CM | POA: Diagnosis not present

## 2017-12-18 DIAGNOSIS — M47816 Spondylosis without myelopathy or radiculopathy, lumbar region: Secondary | ICD-10-CM

## 2017-12-18 MED ORDER — OXYCODONE-ACETAMINOPHEN 10-325 MG PO TABS: 1.0000 | ORAL_TABLET | Freq: Three times a day (TID) | ORAL | 0 refills | Status: DC

## 2017-12-18 NOTE — Progress Notes (Signed)
Nursing Pain Medication Assessment:  Safety precautions to be maintained throughout the outpatient stay will include: orient to surroundings, keep bed in low position, maintain call bell within reach at all times, provide assistance with transfer out of bed and ambulation.  Medication Inspection Compliance: Pill count conducted under aseptic conditions, in front of the patient. Neither the pills nor the bottle was removed from the patient's sight at any time. Once count was completed pills were immediately returned to the patient in their original bottle.  Medication: Oxycodone/APAP Pill/Patch Count: 0 of 75 pills remain Pill/Patch Appearance: Markings consistent with prescribed medication Bottle Appearance: Standard pharmacy container. Clearly labeled. Filled Date: 10/23 / 2019 Last Medication intake:  Today

## 2017-12-18 NOTE — Progress Notes (Signed)
Subjective:  Patient ID: Benjamin Murillo, male    DOB: 07/27/1970  Age: 47 y.o. MRN: [id number]  CC: Abdominal Pain   Procedure: None  HPI Benjamin Murillo presents for reevaluation.  He was last seen a few months ago and has recently contracted C. difficile on top of his Crohn's disease.  This was associated with antibiotics for his dental procedure.  He is having some diarrhea bloating and abdominal pain that is currently being treated with antibiotics.  He still taking his medications for his chronic abdominal pain and these are working well for him.  Based on his narcotic assessment sheet he continues to get good relief with these medications and lifestyle improvement with no untoward side effects noted.  Otherwise he is in his usual state of health with some persistent neck pain and low back pain and abdominal pain but these have been stable in nature.  Outpatient Medications Prior to Visit  Medication Sig Dispense Refill  . amlodipine-atorvastatin (CADUET) 10-10 MG tablet Take 1 tablet by mouth daily.    Marland Kitchen dronabinol (MARINOL) 10 MG capsule Take 10 mg by mouth 2 (two) times daily before a meal.    . famotidine (PEPCID) 20 MG tablet Take 20 mg by mouth 2 (two) times daily.    Marland Kitchen gentamicin (GARAMYCIN) 0.3 % ophthalmic solution Place 1 drop into the right eye every 4 (four) hours. 5 mL 0  . hyoscyamine (LEVSIN, ANASPAZ) 0.125 MG tablet Take 1 tablet by mouth as needed.    . IRON PO Take by mouth.    . magic mouthwash w/lidocaine SOLN Take 5 mLs by mouth 4 (four) times daily. 100 mL 0  . naloxone (NARCAN) nasal spray 4 mg/0.1 mL For respiratory depression from opioids 1 kit 2  . oxyCODONE-acetaminophen (PERCOCET) 10-325 MG tablet Take 1 tablet by mouth 3 (three) times daily. 75 tablet 0  . gabapentin (NEURONTIN) 100 MG capsule Take 1 capsule (100 mg total) by mouth at bedtime. 30 capsule 1   No facility-administered medications prior to visit.     Review of Systems CNS: No confusion  or sedation Cardiac: No angina or palpitations GI: No abdominal pain or constipation Constitutional: No nausea vomiting fevers or chills  Objective:  BP (!) 158/102   Pulse 74   Temp 98.2 F (36.8 C) (Oral)   Resp 14   Ht '5\' 11"'$  (1.803 m)   Wt 130 lb (59 kg)   SpO2 100%   BMI 18.13 kg/m    BP Readings from Last 3 Encounters:  12/18/17 (!) 158/102  10/20/17 (!) 121/96  08/19/17 (!) 138/97     Wt Readings from Last 3 Encounters:  12/18/17 130 lb (59 kg)  10/20/17 135 lb (61.2 kg)  08/19/17 135 lb (61.2 kg)     Physical Exam Pt is alert and oriented PERRL EOMI HEART IS RRR no murmur or rub LCTA no wheezing or rales   Labs  No results found for: HGBA1C Lab Results  Component Value Date   CREATININE 0.94 10/13/2015    -------------------------------------------------------------------------------------------------------------------- Lab Results  Component Value Date   WBC 11.1 (H) 10/13/2015   HGB 12.0 (L) 10/13/2015   HCT 34.1 (L) 10/13/2015   PLT 226 10/13/2015   GLUCOSE 114 (H) 10/13/2015   ALT 15 (L) 10/11/2015   AST 21 10/11/2015   NA 133 (L) 10/13/2015   K 4.0 10/13/2015   CL 104 10/13/2015   CREATININE 0.94 10/13/2015   BUN 11 10/13/2015   CO2  26 10/13/2015    --------------------------------------------------------------------------------------------------------------------- No results found.   Assessment & Plan:   Benjamin Murillo was seen today for abdominal pain.  Diagnoses and all orders for this visit:  Chronic abdominal pain  Chronic pain syndrome  Crohn's disease without complication, unspecified gastrointestinal tract location (HCC)  Chronic, continuous use of opioids  Cervicalgia  Facet arthritis of lumbar region  Chronic bilateral low back pain without sciatica  Other orders -     Discontinue: oxyCODONE-acetaminophen (PERCOCET) 10-325 MG tablet; Take 1 tablet by mouth 3 (three) times daily. -     oxyCODONE-acetaminophen  (PERCOCET) 10-325 MG tablet; Take 1 tablet by mouth 3 (three) times daily.        ----------------------------------------------------------------------------------------------------------------------  Problem List Items Addressed This Visit      Unprioritized   Chronic abdominal pain - Primary   Relevant Medications   oxyCODONE-acetaminophen (PERCOCET) 10-325 MG tablet   Chronic pain syndrome (Chronic)   Crohn disease ([place])    Other Visit Diagnoses    Chronic, continuous use of opioids       Cervicalgia       Facet arthritis of lumbar region       Relevant Medications   oxyCODONE-acetaminophen (PERCOCET) 10-325 MG tablet   Chronic bilateral low back pain without sciatica       Relevant Medications   oxyCODONE-acetaminophen (PERCOCET) 10-325 MG tablet        ----------------------------------------------------------------------------------------------------------------------  1. Chronic abdominal pain Continue with his current medication profile at this point.  We have reviewed his [facility] practitioner database information and it is appropriate.  Refills will be given for November 22 and December 22.  She has no evidence of any hurting or illicit use.  2. Chronic pain syndrome As above  3. Crohn's disease without complication, unspecified gastrointestinal tract location [facility]) Continue follow-up with his primary care physicians and GI doctors.  4. Chronic, continuous use of opioids As above  5. Cervicalgia   6. Facet arthritis of lumbar region   7. Chronic bilateral low back pain without sciatica     ----------------------------------------------------------------------------------------------------------------------  I am having Benjamin Murillo. Fasig maintain his amlodipine-atorvastatin, dronabinol, naloxone, IRON PO, gabapentin, magic mouthwash w/lidocaine, hyoscyamine, famotidine, gentamicin, and oxyCODONE-acetaminophen.   Meds ordered this encounter   Medications  . DISCONTD: oxyCODONE-acetaminophen (PERCOCET) 10-325 MG tablet    Sig: Take 1 tablet by mouth 3 (three) times daily.    Dispense:  75 tablet    Refill:  0    Do not fill until [id number]  . oxyCODONE-acetaminophen (PERCOCET) 10-325 MG tablet    Sig: Take 1 tablet by mouth 3 (three) times daily.    Dispense:  75 tablet    Refill:  0    Do not fill until [id number]   Patient's Medications  New Prescriptions   No medications on file  Previous Medications   AMLODIPINE-ATORVASTATIN (CADUET) 10-10 MG TABLET    Take 1 tablet by mouth daily.   DRONABINOL (MARINOL) 10 MG CAPSULE    Take 10 mg by mouth 2 (two) times daily before a meal.   FAMOTIDINE (PEPCID) 20 MG TABLET    Take 20 mg by mouth 2 (two) times daily.   GABAPENTIN (NEURONTIN) 100 MG CAPSULE    Take 1 capsule (100 mg total) by mouth at bedtime.   GENTAMICIN (GARAMYCIN) 0.3 % OPHTHALMIC SOLUTION    Place 1 drop into the right eye every 4 (four) hours.   HYOSCYAMINE (LEVSIN, ANASPAZ) 0.125 MG TABLET    Take 1 tablet  by mouth as needed.   IRON PO    Take by mouth.   MAGIC MOUTHWASH W/LIDOCAINE SOLN    Take 5 mLs by mouth 4 (four) times daily.   NALOXONE (NARCAN) NASAL SPRAY 4 MG/0.1 ML    For respiratory depression from opioids  Modified Medications   Modified Medication Previous Medication   OXYCODONE-ACETAMINOPHEN (PERCOCET) 10-325 MG TABLET oxyCODONE-acetaminophen (PERCOCET) 10-325 MG tablet      Take 1 tablet by mouth 3 (three) times daily.    Take 1 tablet by mouth 3 (three) times daily.  Discontinued Medications   No medications on file   ----------------------------------------------------------------------------------------------------------------------  Follow-up: Return in about 2 months (around 02/17/2018) for evaluation, med refill.    [name], MD

## 2017-12-18 NOTE — Patient Instructions (Signed)
Oxycodone prescriptions given to patient x2 to last until 02-17-2018.

## 2017-12-20 ENCOUNTER — Ambulatory Visit
Admit: 2017-12-20 | Discharge: 2017-12-23 | Disposition: A | Discharge status: Home / Self Care | Payer: MEDICARE | Admitting: Internal Medicine

## 2017-12-20 DIAGNOSIS — A0472 Enterocolitis due to Clostridium difficile, not specified as recurrent: Principal | ICD-10-CM

## 2017-12-20 LAB — CBC W/ AUTO DIFF
BASOPHILS ABSOLUTE COUNT: 0.2 10*9/L — ABNORMAL HIGH (ref 0.0–0.1)
EOSINOPHILS ABSOLUTE COUNT: 0.2 10*9/L (ref 0.0–0.4)
EOSINOPHILS RELATIVE PERCENT: 1 %
HEMATOCRIT: 40.6 % — ABNORMAL LOW (ref 41.0–53.0)
HEMOGLOBIN: 13.4 g/dL — ABNORMAL LOW (ref 13.5–17.5)
LYMPHOCYTES ABSOLUTE COUNT: 3.3 10*9/L (ref 1.5–5.0)
LYMPHOCYTES RELATIVE PERCENT: 16.9 %
MEAN CORPUSCULAR HEMOGLOBIN CONC: 33.2 g/dL (ref 31.0–37.0)
MEAN CORPUSCULAR HEMOGLOBIN: 31.4 pg (ref 26.0–34.0)
MEAN CORPUSCULAR VOLUME: 94.6 fL (ref 80.0–100.0)
MEAN PLATELET VOLUME: 7 fL (ref 7.0–10.0)
MONOCYTES ABSOLUTE COUNT: 1 10*9/L — ABNORMAL HIGH (ref 0.2–0.8)
MONOCYTES RELATIVE PERCENT: 5.3 %
NEUTROPHILS ABSOLUTE COUNT: 14.7 10*9/L — ABNORMAL HIGH (ref 2.0–7.5)
NEUTROPHILS RELATIVE PERCENT: 74.4 %
PLATELET COUNT: 415 10*9/L (ref 150–440)
RED BLOOD CELL COUNT: 4.29 10*12/L — ABNORMAL LOW (ref 4.50–5.90)
RED CELL DISTRIBUTION WIDTH: 13.2 % (ref 12.0–15.0)
WBC ADJUSTED: 19.7 10*9/L — ABNORMAL HIGH (ref 4.5–11.0)

## 2017-12-20 LAB — COMPREHENSIVE METABOLIC PANEL
ALBUMIN: 5 g/dL (ref 3.5–5.0)
ALKALINE PHOSPHATASE: 128 U/L — ABNORMAL HIGH (ref 38–126)
ALT (SGPT): 23 U/L (ref <50)
ANION GAP: 15 mmol/L (ref 7–15)
BILIRUBIN TOTAL: 0.9 mg/dL (ref 0.0–1.2)
BLOOD UREA NITROGEN: 10 mg/dL (ref 7–21)
BUN / CREAT RATIO: 11
CALCIUM: 10.3 mg/dL — ABNORMAL HIGH (ref 8.5–10.2)
CO2: 19 mmol/L — ABNORMAL LOW (ref 22.0–30.0)
CREATININE: 0.94 mg/dL (ref 0.70–1.30)
EGFR CKD-EPI AA MALE: >90 mL/min/{1.73_m2} (ref >=60)
EGFR CKD-EPI NON-AA MALE: >90 mL/min/{1.73_m2} (ref >=60)
GLUCOSE RANDOM: 94 mg/dL (ref 65–179)
PROTEIN TOTAL: 8.7 g/dL — ABNORMAL HIGH (ref 6.5–8.3)

## 2017-12-20 LAB — URINALYSIS WITH CULTURE REFLEX
BACTERIA: NONE SEEN /HPF
BILIRUBIN UA: NEGATIVE
BLOOD UA: NEGATIVE
GLUCOSE UA: NEGATIVE
KETONES UA: NEGATIVE
LEUKOCYTE ESTERASE UA: NEGATIVE
PH UA: 6 (ref 5.0–9.0)
PROTEIN UA: NEGATIVE
RBC UA: 1 /HPF (ref <=3)
SPECIFIC GRAVITY UA: 1.017 (ref 1.003–1.030)
SQUAMOUS EPITHELIAL: <1 /HPF (ref 0–5)
UROBILINOGEN UA: 0.2

## 2017-12-20 LAB — POTASSIUM: Potassium:SCnc:Pt:Ser/Plas:Qn:: 4.3

## 2017-12-20 LAB — LACTATE BLOOD VENOUS
Lactate:SCnc:Pt:BldV:Qn:: 0.9
Lactate:SCnc:Pt:BldV:Qn:: 5.3 — ABNORMAL HIGH

## 2017-12-20 LAB — AST (SGOT): Aspartate aminotransferase:CCnc:Pt:Ser/Plas:Qn:: 33

## 2017-12-20 LAB — ERYTHROCYTE SEDIMENTATION RATE: Lab: 10

## 2017-12-20 LAB — ANION GAP: Anion gap 3:SCnc:Pt:Ser/Plas:Qn:: 15

## 2017-12-20 LAB — C-REACTIVE PROTEIN: C reactive protein:MCnc:Pt:Ser/Plas:Qn:: <5

## 2017-12-20 LAB — NEUTROPHILS RELATIVE PERCENT: Lab: 74.4

## 2017-12-20 LAB — GLUCOSE UA: Lab: NEGATIVE

## 2017-12-20 LAB — THYROID STIMULATING HORMONE: Thyrotropin:ACnc:Pt:Ser/Plas:Qn:: 0.902

## 2017-12-20 NOTE — Unmapped (Signed)
Patient rounds completed. The following patient needs were addressed:  Pain, Toileting, Positioning;   SUPINE, Personal Belongings, Plan of Care, Call Bell in Reach and Bed Position Low .

## 2017-12-20 NOTE — Unmapped (Signed)
PT in triage writhing and crying in pain, reports being dx with C diff last week and given vancomycin and reports pain worsening this AM. PT still endorses nausea/vomiting/diarrhea.

## 2017-12-20 NOTE — Unmapped (Signed)
[facility]  Emergency Department Provider Note      ED Clinical Impression      Final diagnoses:   Abdominal pain, unspecified abdominal location (Primary)   C. difficile enteritis   Crohn's disease with complication, unspecified gastrointestinal tract location (CMS-HCC)         Impression, ED Course, Assessment and Plan      Impression: Patient is a 47 y.o. male with PMH of Crohn's disease, HTN, and GERD presenting for 1 week of abdominal pain that acutely worsened 2 hours ago (1230) with associated nausea, vomiting, and diarrhea in the setting of recent C. Diff dx last week, on Vanc po.     Differential includes Chron's exacerbation vs partially treated c. diff vs acute gastroenteritis vs abscess vs perf vs SBO.     Plan for CT A/P and labs. Will give Zofran, Fentanyl, and IVF.     3:56 PM   WBC elevated to 19.7 with lactate 5.3.   Ct reviewed (no abscess, perf), plan to admit med, plan to discuss with GI for their input this evening  1700 Signed out oncoming provider, waiting to discuss with GI, and call back from med admit team. Updated family on plan.Continues to get pain meds and fluids     Additional Medical Decision Making     I have reviewed the vital signs and the nursing notes. Labs and radiology results that were available during my care of the patient were independently reviewed by me and considered in my medical decision making.     I independently visualized the radiology images.   I reviewed the patient's prior medical records (Discharge Summary from 11/13).     Portions of this record have been created using Scientist, clinical (histocompatibility and immunogenetics). Dictation errors have been sought, but may not have been identified and corrected.  ____________________________________________         History        Chief Complaint  Abdominal Pain      HPI   Rodney Bender is a 47 y.o. male with PMH of Crohn's disease, HTN, and GERD presenting to the ED for abdominal pain. Patient reports 1 week of abdominal pain that acutely worsened 2 hours ago (1230) with associated nausea, vomiting, and diarrhea in the setting of recent C. Diff dx last week (11/12-14). Denies fever.Last episode of vomiting was 2 hours ago (1230). He reports compliance with vancomycin. He takes Oxycodone twice daily for pain without relief. He states the pain (severe, writhing in bed) feels similar to prior abscesses. He spoke to his Crohn's doctor last week regarding his recent admission for C. Diff. He states he takes Fentanyl for pain in the hospital. Patient denies blood in stool or vomit, fevers, or any other medical concerns at this time.      Past Medical History:   Diagnosis Date   ??? Crohn's disease (CMS-HCC)     diagnosed in 1990   ??? GERD (gastroesophageal reflux disease)    ??? Hypertension 10/08/2016       Patient Active Problem List   Diagnosis   ??? Tobacco use disorder   ??? Intestinal bypass or anastomosis status   ??? Mixed, or nondependent drug abuse, in remission (CMS-HCC)   ??? Abdominal pain, chronic, epigastric   ??? Nonadherence to medical treatment   ??? Crohn's disease of both small and large intestine with intestinal obstruction (CMS-HCC)   ??? Abdominal pain, chronic, right lower quadrant   ??? Clostridium difficile enteritis  Past Surgical History:   Procedure Laterality Date   ??? COLON SURGERY     ??? PR COLONOSCOPY FLX DX W/COLLJ SPEC WHEN PFRMD Left 11/10/2012    Procedure: COLONOSCOPY, FLEXIBLE, PROXIMAL TO SPLENIC FLEXURE; DIAGNOSTIC, W/WO COLLECTION SPECIMEN BY BRUSH OR WASH;  Surgeon: Malcolm Metro, MD;  [facility]: [facility];  Service: Gastroenterology   ??? PR COLONOSCOPY FLX DX W/COLLJ SPEC WHEN PFRMD N/A 07/21/2013    Procedure: COLONOSCOPY, FLEXIBLE, PROXIMAL TO SPLENIC FLEXURE; DIAGNOSTIC, W/WO COLLECTION SPECIMEN BY BRUSH OR WASH;  Surgeon: Gwen Pounds, MD;  Location: [facility];  Service: Gastroenterology   ??? PR COLONOSCOPY FLX DX W/COLLJ SPEC WHEN PFRMD N/A 08/09/2016    Procedure: COLONOSCOPY, FLEXIBLE, PROXIMAL TO SPLENIC FLEXURE; DIAGNOSTIC, W/WO COLLECTION SPECIMEN BY BRUSH OR WASH;  Surgeon: Janyth Pupa, MD;  Location: [facility];  Service: Gastroenterology   ??? PR COLONOSCOPY W/BIOPSY SINGLE/MULTIPLE  07/23/2012    Procedure: COLONOSCOPY, FLEXIBLE, PROXIMAL TO SPLENIC FLEXURE; WITH BIOPSY, SINGLE OR MULTIPLE;  Surgeon: Vickii Chafe, MD;  Location: [facility];  Service: Gastroenterology   ??? PR COLONOSCOPY W/BIOPSY SINGLE/MULTIPLE N/A 07/15/2014    Procedure: COLONOSCOPY, FLEXIBLE, PROXIMAL TO SPLENIC FLEXURE; WITH BIOPSY, SINGLE OR MULTIPLE;  Surgeon: Janyth Pupa, MD;  Location: [facility];  Service: Gastroenterology   ??? PR COLONOSCOPY W/BIOPSY SINGLE/MULTIPLE N/A 03/21/2017    Procedure: COLONOSCOPY, FLEXIBLE, PROXIMAL TO SPLENIC FLEXURE; WITH BIOPSY, SINGLE OR MULTIPLE;  Surgeon: Modena Nunnery, MD;  Location: [facility] [place] [facility];  Service: Gastroenterology   ??? PR COLSC FLEXIBLE W/TRANSENDOSCOPIC BALLOON DILAT N/A 07/15/2014    Procedure: COLONOSCOPY, FLEXIBLE; WITH DILATION BY BALLOON, 1 OR MORE STRICTURES;  Surgeon: Janyth Pupa, MD;  Location: [facility];  Service: Gastroenterology   ??? PR REMVL COLON & TERM ILEUM W/ILEOCOLOSTOMY N/A 09/16/2016    Procedure: COLECTOMY, PARTIAL, WITH REMOVAL OF TERMINAL ILEUM WITH ILEOCOLOSTOMY;  Surgeon: Lady Gary, MD;  Location: MAIN OR [place];  Service: Gastrointestinal   ??? PR UPPER GI ENDOSCOPY,BIOPSY N/A 07/23/2012    Procedure: UGI ENDOSCOPY; WITH BIOPSY, SINGLE OR MULTIPLE;  Surgeon: Vickii Chafe, MD;  Location: [facility];  Service: Gastroenterology   ??? PR UPPER GI ENDOSCOPY,DIAGNOSIS N/A 11/10/2012    Procedure: UGI ENDO, INCLUDE ESOPHAGUS, STOMACH, & DUODENUM &/OR JEJUNUM; DX W/WO COLLECTION SPECIMN, BY BRUSH OR WASH;  Surgeon: Malcolm Metro, MD;  [facility]: [facility];  Service: Gastroenterology ??? PR UPPER GI ENDOSCOPY,DIAGNOSIS N/A 07/21/2013    Procedure: UGI ENDO, INCLUDE ESOPHAGUS, STOMACH, & DUODENUM &/OR JEJUNUM; DX W/WO COLLECTION SPECIMN, BY BRUSH OR WASH;  Surgeon: Gwen Pounds, MD;  [facility]: [facility];  Service: Gastroenterology   ??? PR UPPER GI ENDOSCOPY,DIAGNOSIS N/A 07/15/2014    Procedure: UGI ENDO, INCLUDE ESOPHAGUS, STOMACH, & DUODENUM &/OR JEJUNUM; DX W/WO COLLECTION SPECIMN, BY BRUSH OR WASH;  Surgeon: Janyth Pupa, MD;  [facility];  Service: Gastroenterology         Current Facility-Administered Medications:   ???  fentaNYL (PF) (SUBLIMAZE) injection 50 mcg, 50 mcg, Intravenous, Q2H PRN, Buford Dresser, MD, 50 mcg at 12/20/17 1739  ???  fentaNYL (PF) (SUBLIMAZE) injection 50 mcg, 50 mcg, Intravenous, Once, Buford Dresser, MD, Stopped at 12/20/17 1744  ???  sodium chloride 0.9% (NS BOLUS) bolus 1,000 mL, 1,000 mL, Intravenous, Once, Buford Dresser, MD, Last Rate: 1,000 mL/hr at 12/20/17 1743, 1,000  mL at 12/20/17 1743    Current Outpatient Medications:   ???  cholestyramine (QUESTRAN) 4 gram packet, Take 1 packet by mouth Three (3) times a day with a meal. 1 packet by mouth 3x daily before meals. Don't take within 2hrs of other meds, Disp: 60 each, Rfl: 2  ???  dronabinol (MARINOL) 10 MG capsule, Take 1 capsule (10 mg total) by mouth Two (2) times a day (30 minutes before a meal)., Disp: 60 capsule, Rfl: 2  ???  empty container Misc, Use as directed, Disp: 1 each, Rfl: PRN  ???  famotidine (PEPCID) 20 MG tablet, Take 20 mg by mouth two (2) times a day as needed for heartburn., Disp: , Rfl:   ???  gabapentin (NEURONTIN) 300 MG capsule, Start with 300-300-600 x 1 week, then 600-300-600 x 1 week, then 600-600-600 if well tolerated, Disp: 180 capsule, Rfl: 1  ???  hyoscyamine (ANASPAZ,LEVSIN) 0.125 mg tablet, TAKE 1 TABLET BY MOUTH EVERY 4 HOURS AS NEEDED FOR CRAMPING, Disp: 30 tablet, Rfl: 11  ???  multivitamins, therapeutic with minerals tablet, Take 1 tablet by mouth daily., Disp: 30 tablet, Rfl: 1  ???  oxyCODONE-acetaminophen (PERCOCET) 10-325 mg per tablet, Take 1 tablet by mouth every eight (8) hours as needed for pain. , Disp: , Rfl:   ???  oxyCODONE-acetaminophen (PERCOCET) 10-325 mg per tablet, Take 1 tablet by mouth every six (6) hours as needed for pain. for up to 10 doses, Disp: 10 tablet, Rfl: 0  ???  promethazine (PHENERGAN) 12.5 MG tablet, Take 2 tablets (25 mg total) by mouth every six (6) hours as needed for nausea., Disp: 15 tablet, Rfl: 3  ???  sildenafil (VIAGRA) 50 MG tablet, Take 1 tablet (50 mg total) by mouth daily as needed for erectile dysfunction., Disp: 10 tablet, Rfl: 11  ???  ustekinumab (STELARA) 90 mg/mL Syrg syringe, Inject 1 mL (90 mg total) under the skin every 8 weeks., Disp: 1 mL, Rfl: 5    Allergies  Vicodin [hydrocodone-acetaminophen]; Tramadol; Dilaudid [hydromorphone]; Morphine; and Opioids - morphine analogues    Family History   Problem Relation Age of Onset   ??? Hyperlipidemia Father    ??? Cancer Maternal Aunt    ??? No Known Problems Mother    ??? No Known Problems Sister    ??? No Known Problems Brother    ??? No Known Problems Maternal Uncle    ??? No Known Problems Paternal Aunt    ??? No Known Problems Paternal Uncle    ??? No Known Problems Maternal Grandmother    ??? No Known Problems Maternal Grandfather    ??? No Known Problems Paternal Grandmother    ??? No Known Problems Paternal Grandfather    ??? Anesthesia problems Neg Hx    ??? Broken bones Neg Hx    ??? Clotting disorder Neg Hx    ??? Collagen disease Neg Hx    ??? Diabetes Neg Hx    ??? Dislocations Neg Hx    ??? Fibromyalgia Neg Hx    ??? Gout Neg Hx    ??? Hemophilia Neg Hx    ??? Osteoporosis Neg Hx    ??? Rheumatologic disease Neg Hx    ??? Scoliosis Neg Hx    ??? Severe sprains Neg Hx    ??? Sickle cell anemia Neg Hx    ??? Spinal Compression Fracture Neg Hx        Social History  Social History     Tobacco Use   ???  Smoking status: Former Smoker     Packs/day: 1.00     Years: 18.00     Pack years: 18.00     Types: Cigarettes     Start date: 08/27/2003     Last attempt to quit: 06/06/2016     Years since quitting: 1.5   ??? Smokeless tobacco: Never Used   ??? Tobacco comment: Pt smokes 1ppd, Pt is interested in tobacco cessation    Substance Use Topics   ??? Alcohol use: No   ??? Drug use: Yes     Types: Marijuana       Review of Systems  Constitutional: Negative for fever.  Eyes: Negative for visual changes.  ENT: Negative for sore throat.  Cardiovascular: Negative for chest pain.  Respiratory: Negative for shortness of breath.  Gastrointestinal: + for abdominal pain, vomiting and diarrhea.  Genitourinary: Negative for dysuria.   Musculoskeletal: Negative for back pain.  Skin: Negative for rash.  Neurological: Negative for headaches, focal weakness or numbness.     Physical Exam     ED Triage Vitals [12/20/17 1411]   Enc Vitals Group      BP 144/114      Heart Rate 87      Resp 28      Temp 37 ??C (98.6 ??F)      SpO2 98 %     Constitutional: Writhing in pain, ill appearing.  Eyes: Conjunctivae are normal.  ENT       Head: Normocephalic and atraumatic.       Nose: No congestion.       Mouth/Throat: Mucous membranes are dry.       Neck: No stridor.  Hematological/Lymphatic/Immunilogical: No cervical lymphadenopathy.  Cardiovascular: Normal rate, regular rhythm. Normal and symmetric distal pulses are present in all extremities.  Respiratory: Normal respiratory effort. Breath sounds are normal.  Gastrointestinal: Soft. Epigastric and moderate LUQ tenderness, hypoactive BS.  Musculoskeletal: Normal range of motion in all extremities.       Right lower leg: No tenderness or edema.       Left lower leg: No tenderness or edema.  Neurologic: Normal speech and language. No gross focal neurologic deficits are appreciated.  Skin: Skin is warm, dry and intact. No rash noted.  Psychiatric: Mood and affect are normal. Speech and behavior are normal.     Radiology     CT Abdomen Pelvis with IV Contrast ONLY   Preliminary Result Mucosal thickening and enhancement in the left midabdomen small bowel compatible with enteritis,, likely from Crohn's flare.      Areas of mucosal thickening in the sigmoid colon and rectum without significant associated fat stranding, may be due to underdistention versus inflammation secondary to Crohn's versus C. difficile colitis could have a similar appearance.       No evidence of obstruction or abscess.         ______________________________________________________   Documentation assistance was provided by Eliezer Mccoy, Scribe, on December 20, 2017 at 2:21 PM for Sherrie Mustache, MD.   December 21, 2017 10:22 AM. Documentation assistance provided by the scribe. I was present during the time the encounter was recorded. The information recorded by the scribe was done at my direction and has been reviewed and validated by me.         Buford Dresser, MD  12/21/17 1022

## 2017-12-21 LAB — BASIC METABOLIC PANEL
ANION GAP: 6 mmol/L — ABNORMAL LOW (ref 7–15)
BUN / CREAT RATIO: 10
CALCIUM: 8.7 mg/dL (ref 8.5–10.2)
CHLORIDE: 103 mmol/L (ref 98–107)
CO2: 24 mmol/L (ref 22.0–30.0)
CREATININE: 0.81 mg/dL (ref 0.70–1.30)
EGFR CKD-EPI AA MALE: >90 mL/min/{1.73_m2} (ref >=60)
EGFR CKD-EPI NON-AA MALE: >90 mL/min/{1.73_m2} (ref >=60)
GLUCOSE RANDOM: 77 mg/dL (ref 65–179)
POTASSIUM: 3.9 mmol/L (ref 3.5–5.0)
SODIUM: 133 mmol/L — ABNORMAL LOW (ref 135–145)

## 2017-12-21 LAB — CBC W/ AUTO DIFF
BASOPHILS ABSOLUTE COUNT: 0 10*9/L (ref 0.0–0.1)
BASOPHILS RELATIVE PERCENT: 0.5 %
EOSINOPHILS ABSOLUTE COUNT: 0.1 10*9/L (ref 0.0–0.4)
EOSINOPHILS RELATIVE PERCENT: 1.5 %
HEMOGLOBIN: 11.1 g/dL — ABNORMAL LOW (ref 13.5–17.5)
LARGE UNSTAINED CELLS: 2 % (ref 0–4)
LYMPHOCYTES ABSOLUTE COUNT: 2.2 10*9/L (ref 1.5–5.0)
LYMPHOCYTES RELATIVE PERCENT: 28.7 %
MEAN CORPUSCULAR HEMOGLOBIN CONC: 32.5 g/dL (ref 31.0–37.0)
MEAN CORPUSCULAR HEMOGLOBIN: 30.9 pg (ref 26.0–34.0)
MEAN CORPUSCULAR VOLUME: 95 fL (ref 80.0–100.0)
MEAN PLATELET VOLUME: 6.8 fL — ABNORMAL LOW (ref 7.0–10.0)
MONOCYTES ABSOLUTE COUNT: 0.5 10*9/L (ref 0.2–0.8)
NEUTROPHILS ABSOLUTE COUNT: 4.6 10*9/L (ref 2.0–7.5)
NEUTROPHILS RELATIVE PERCENT: 61 %
RED BLOOD CELL COUNT: 3.59 10*12/L — ABNORMAL LOW (ref 4.50–5.90)
RED CELL DISTRIBUTION WIDTH: 13.5 % (ref 12.0–15.0)
WBC ADJUSTED: 7.5 10*9/L (ref 4.5–11.0)

## 2017-12-21 LAB — CALCIUM: Calcium:MCnc:Pt:Ser/Plas:Qn:: 8.7

## 2017-12-21 LAB — LYMPHOCYTES ABSOLUTE COUNT: Lab: 2.2

## 2017-12-21 NOTE — Unmapped (Signed)
Patient rounds completed. The following patient needs were addressed:  Pain, Toileting, Positioning;   SUPINE, Personal Belongings, Plan of Care, Call Bell in Reach and Bed Position Low .

## 2017-12-21 NOTE — Unmapped (Signed)
Internal Medicine (MDW) History and Physical    Assessment & Plan:  Rodney Bender is a 47 y.o. male with history of Crohn's disease on Stelara, recent diagnosis of C. Diff colitis on oral vanc (11/12) that presented to [facility] with worsening nausea, vomiting, abdominal pain, diarrhea.      Active Problems:    Crohn's disease of both small and large intestine with intestinal obstruction (CMS-HCC)    Clostridium difficile enteritis    Severe C. Diff infection. Originally diagnosed on 11/12, in the setting of patient taking clindamycin for oral infection. Has been compliant with oral vancomycin. He presents today with worsening of n/v/d, with primary reason for presentation being worsening abdominal pain. CRP<5, decreasing suspicion for Crohn's flare. CT A/P with contrast did not demonstrate perforation, obstruction or abscess. Did show mucosal thickening in sigmoid colon and rectum that could be c/w c. Diff colitis. Exam without peritoneal signs. WBC of 19.7, lactate of 5.3, with normalization after 2L IVF. No fevers.   -- Start oral vancomycin solution/IV flagyl. Consider fidaxomicin, but will need ID approval.   -- Additional 1L LR, start mIVF at 144ml/hr.   -- Pain control: IV dilaudid in the short term, then PO dilaudid.   -- Home meds include: cholestyramine, pepcid. Hold in setting of active c. Diff infection.  -- If no improvement in symptoms or worsening leukocytosis, consider GI consult vs. surgery consult in the AM.      Crohn's disease. Does not appear to be in an active flare, supported by CRP<5.   -- Stelara 90mg  q8 weeks     Daily Checklist:  Diet: Regular Diet  DVT PPx: Lovenox 40mg  q24h   GI PPx: Not Indicated  Code Status: Full Code  Dispo: Admit to Med W floors    This patient will be discussed with the MDW attending physician, Dr. Lennie Muckle.   ___________________________________________________________________    Chief Complaint:  Chief Complaint   Patient presents with   ??? Abdominal Pain Clostridium difficile enteritis    HPI:  Rodney Bender is a 47 y.o. male with PMHx as reviewed in the EMR that presented to [facility] with Clostridium difficile enteritis.    Mr. Trowbridge was admitted to the observation unit from 11/12-11/14 with abdominal pain, n/v/d and treated for C. Diff colitis. He had recently completed a course of clindamycin for an oral infection. After two days of oral vancomycin treatment he was feeling improved and tolerating regular diet. He states he has been compliant with the oral vancomycin. However, he should have completed his course on 11/21, but upon review of his pill box he still had 8 pills remaining.   Since discharge, he has had worsening abdominal pain, nausea, vomiting and has continued to have diarrhea. He presented to the ED today because the pain had gotten too severe. He states his stools have never become formed. Over the course of the last 24h, he has had 4 loose stools. No hematochezia, no melena. Denies fevers, chills.    Allergies:  Vicodin [hydrocodone-acetaminophen]; Tramadol; Dilaudid [hydromorphone]; Morphine; and Opioids - morphine analogues    Medications:   Prior to Admission medications    Medication Dose, Route, Frequency   vancomycin (VANCOCIN) 250 MG capsule 250 mg, Oral, Every 6 hours   cholestyramine (QUESTRAN) 4 gram packet 1 packet, Oral, 3 times a day (with meals), 1 packet by mouth 3x daily before meals. Don't take within 2hrs of other meds   dronabinol (MARINOL) 10 MG capsule 10 mg, Oral, 2  times a day (AC)   empty container Misc Use as directed   famotidine (PEPCID) 20 MG tablet 20 mg, Oral, 2 times a day PRN   gabapentin (NEURONTIN) 300 MG capsule Start with 300-300-600 x 1 week, then 600-300-600 x 1 week, then 600-600-600 if well tolerated   hyoscyamine (ANASPAZ,LEVSIN) 0.125 mg tablet TAKE 1 TABLET BY MOUTH EVERY 4 HOURS AS NEEDED FOR CRAMPING   multivitamins, therapeutic with minerals tablet 1 tablet, Oral, Daily (standard) oxyCODONE-acetaminophen (PERCOCET) 10-325 mg per tablet 1 tablet, Oral, Every 8 hours PRN   oxyCODONE-acetaminophen (PERCOCET) 10-325 mg per tablet 1 tablet, Oral, Every 6 hours PRN   promethazine (PHENERGAN) 12.5 MG tablet 25 mg, Oral, Every 6 hours PRN   sildenafil (VIAGRA) 50 MG tablet 50 mg, Oral, Daily PRN   ustekinumab (STELARA) 90 mg/mL Syrg syringe 90 mg, Subcutaneous, every 8 weeks       Medical History:  Past Medical History:   Diagnosis Date   ??? Anxiety    ??? Crohn's disease (CMS-HCC)     diagnosed in 1990   ??? GERD (gastroesophageal reflux disease)    ??? Hypertension 10/08/2016       Surgical History:  Past Surgical History:   Procedure Laterality Date   ??? COLON SURGERY     ??? PR COLONOSCOPY FLX DX W/COLLJ SPEC WHEN PFRMD Left 11/10/2012    Procedure: COLONOSCOPY, FLEXIBLE, PROXIMAL TO SPLENIC FLEXURE; DIAGNOSTIC, W/WO COLLECTION SPECIMEN BY BRUSH OR WASH;  Surgeon: Malcolm Metro, MD;  [facility];  Service: Gastroenterology   ??? PR COLONOSCOPY FLX DX W/COLLJ SPEC WHEN PFRMD N/A 07/21/2013    Procedure: COLONOSCOPY, FLEXIBLE, PROXIMAL TO SPLENIC FLEXURE; DIAGNOSTIC, W/WO COLLECTION SPECIMEN BY BRUSH OR WASH;  Surgeon: Gwen Pounds, MD;  [facility];  Service: Gastroenterology   ??? PR COLONOSCOPY FLX DX W/COLLJ SPEC WHEN PFRMD N/A 08/09/2016    Procedure: COLONOSCOPY, FLEXIBLE, PROXIMAL TO SPLENIC FLEXURE; DIAGNOSTIC, W/WO COLLECTION SPECIMEN BY BRUSH OR WASH;  Surgeon: Janyth Pupa, MD;  [facility];  Service: Gastroenterology   ??? PR COLONOSCOPY W/BIOPSY SINGLE/MULTIPLE  07/23/2012    Procedure: COLONOSCOPY, FLEXIBLE, PROXIMAL TO SPLENIC FLEXURE; WITH BIOPSY, SINGLE OR MULTIPLE;  Surgeon: Vickii Chafe, MD;  [facility];  Service: Gastroenterology   ??? PR COLONOSCOPY W/BIOPSY SINGLE/MULTIPLE N/A 07/15/2014    Procedure: COLONOSCOPY, FLEXIBLE, PROXIMAL TO SPLENIC FLEXURE; WITH BIOPSY, SINGLE OR MULTIPLE;  Surgeon: Janyth Pupa, MD;  Location: [facility];  Service: Gastroenterology   ??? PR COLONOSCOPY W/BIOPSY SINGLE/MULTIPLE N/A 03/21/2017    Procedure: COLONOSCOPY, FLEXIBLE, PROXIMAL TO SPLENIC FLEXURE; WITH BIOPSY, SINGLE OR MULTIPLE;  Surgeon: Modena Nunnery, MD;  Location: [facility] PROC[facility]URES [place] [facility];  Service: Gastroenterology   ??? PR COLSC FLEXIBLE W/TRANSENDOSCOPIC BALLOON DILAT N/A 07/15/2014    Procedure: COLONOSCOPY, FLEXIBLE; WITH DILATION BY BALLOON, 1 OR MORE STRICTURES;  Surgeon: Janyth Pupa, MD;  Location: [facility];  Service: Gastroenterology   ??? PR REMVL COLON & TERM ILEUM W/ILEOCOLOSTOMY N/A 09/16/2016    Procedure: COLECTOMY, PARTIAL, WITH REMOVAL OF TERMINAL ILEUM WITH ILEOCOLOSTOMY;  Surgeon: Lady Gary, MD;  Location: MAIN OR [place];  Service: Gastrointestinal   ??? PR UPPER GI ENDOSCOPY,BIOPSY N/A 07/23/2012    Procedure: UGI ENDOSCOPY; WITH BIOPSY, SINGLE OR MULTIPLE;  Surgeon: Vickii Chafe, MD;  [facility];  Service: Gastroenterology   ??? PR UPPER GI ENDOSCOPY,DIAGNOSIS N/A 11/10/2012  Procedure: UGI ENDO, INCLUDE ESOPHAGUS, STOMACH, & DUODENUM &/OR JEJUNUM; DX W/WO COLLECTION SPECIMN, BY BRUSH OR WASH;  Surgeon: Malcolm Metro, MD;  Location: [facility];  Service: Gastroenterology   ??? PR UPPER GI ENDOSCOPY,DIAGNOSIS N/A 07/21/2013    Procedure: UGI ENDO, INCLUDE ESOPHAGUS, STOMACH, & DUODENUM &/OR JEJUNUM; DX W/WO COLLECTION SPECIMN, BY BRUSH OR WASH;  Surgeon: Gwen Pounds, MD;  Location[facility];  Service: Gastroenterology   ??? PR UPPER GI ENDOSCOPY,DIAGNOSIS N/A 07/15/2014    Procedure: UGI ENDO, INCLUDE ESOPHAGUS, STOMACH, & DUODENUM &/OR JEJUNUM; DX W/WO COLLECTION SPECIMN, BY BRUSH OR WASH;  Surgeon: Janyth Pupa, MD;  [facility];  Service: Gastroenterology       Social History:  Social History Socioeconomic History   ??? Marital status: Single     Spouse name: Not on file   ??? Number of children: Not on file   ??? Years of education: Not on file   ??? Highest education level: Not on file   Occupational History   ??? Not on file   Social Needs   ??? Financial resource strain: Not hard at all   ??? Food insecurity:     Worry: Never true     Inability: Never true   ??? Transportation needs:     Medical: No     Non-medical: No   Tobacco Use   ??? Smoking status: Former Smoker     Packs/day: 1.00     Years: 18.00     Pack years: 18.00     Types: Cigarettes     Start date: 08/27/2003     Last attempt to quit: 06/06/2017     Years since quitting: 0.5   ??? Smokeless tobacco: Never Used   ??? Tobacco comment: Pt smokes 1ppd, Pt is interested in tobacco cessation    Substance and Sexual Activity   ??? Alcohol use: No   ??? Drug use: Yes     Types: Marijuana   ??? Sexual activity: Yes     Partners: Female     Birth control/protection: Condom   Lifestyle   ??? Physical activity:     Days per week: 3 days     Minutes per session: 30 min   ??? Stress: Very much   Relationships   ??? Social connections:     Talks on phone: More than three times a week     Gets together: More than three times a week     Attends religious service: 1 to 4 times per year     Active member of club or organization: Yes     Attends meetings of clubs or organizations: 1 to 4 times per year     Relationship status: Married   Other Topics Concern   ??? Not on file   Social History Narrative   ??? Not on file       Family History:  Family History   Problem Relation Age of Onset   ??? Hyperlipidemia Father    ??? Cancer Maternal Aunt    ??? No Known Problems Mother    ??? No Known Problems Sister    ??? No Known Problems Brother    ??? No Known Problems Maternal Uncle    ??? No Known Problems Paternal Aunt    ??? No Known Problems Paternal Uncle    ??? No Known Problems Maternal Grandmother    ??? No Known Problems Maternal Grandfather    ???  No Known Problems Paternal Grandmother    ??? No Known Problems Paternal Grandfather    ??? Anesthesia problems Neg Hx    ??? Broken bones Neg Hx    ??? Clotting disorder Neg Hx    ??? Collagen disease Neg Hx    ??? Diabetes Neg Hx    ??? Dislocations Neg Hx    ??? Fibromyalgia Neg Hx    ??? Gout Neg Hx    ??? Hemophilia Neg Hx    ??? Osteoporosis Neg Hx    ??? Rheumatologic disease Neg Hx    ??? Scoliosis Neg Hx    ??? Severe sprains Neg Hx    ??? Sickle cell anemia Neg Hx    ??? Spinal Compression Fracture Neg Hx        Review of Systems:  ROS    Labs/Studies:  Labs per EMR and Reviewed (last 24hrs)    Imaging: Radiology studies were personally reviewed    Physical Exam:  Temp:  [35.9 ??C-37 ??C] 35.9 ??C  Heart Rate:  [63-93] 72  SpO2 Pulse:  [65-85] 76  Resp:  [9-28] 16  BP: (118-156)/(81-114) 156/100  SpO2:  [92 %-99 %] 99 %    Gen: nontoxic appearing male, in moderate distress due to pain, alert, oriented, answers questions appropriately   HEENT:  MMM. OP w/o erythema or exudate   Heart: RRR, S1, S2, no M/R/G  Lungs: CTAB, no wheezes, rhonci, or rales, no use of accessory muscles  Abdomen: Significant tenderness to palpation in bilateral lower quadrants R>L, periumbilical. No rebound tenderness, BS present  Extremities: no clubbing, cyanosis, or edema: pulses are +2 in bilateral upper and lower extremities  Neuro: No focal deficits.  Skin:  No rashes, lesions  Psych: Normal mood and affect.

## 2017-12-21 NOTE — Unmapped (Signed)
Patient admitted to 8BT from ED for further evaluation of abdominal pain. Patient accompanied by spouse who stayed overnight at bedside. He is alert and oriented x4. Independent with ADLs and ambulation. Patient c/o of abdominal pain relived by PRN dilaudid. Patient requested PRN compazine IV then PO. Patient received 1000 mL NS fluid bolus. LR infusing at 125 ml/hr continuously. VAT consulted for PIV this shift due to 3 infiltrations in ED. VAT nurse unsuccessful. Patient refused to have Left arm inspected for PIV placement. #22 G in right thumb still remains patent at this time. Patient unable to provide stool sample this shift. Patient tolerating PO medications, jello and gingerale. Patient also requested medication to help with anxiousness this shift; PRN atarax administered with good effect. Will continue with plan of care.       Problem: Adult Inpatient Plan of Care  Goal: Plan of Care Review  Outcome: Progressing  Flowsheets (Taken 12/21/2017 0244)  Progress: improving  Plan of Care Reviewed With: patient; spouse  Goal: Patient-Specific Goal (Individualization)  Outcome: Progressing  Flowsheets (Taken 12/21/2017 0244)  Patient-Specific Goals (Include Timeframe): Patient will be able to produce stool sample by end of shift.  Anxieties, Fears or Concerns: Patient anxious about being in hospital  Goal: Absence of Hospital-Acquired Illness or Injury  Outcome: Progressing  Goal: Optimal Comfort and Wellbeing  Outcome: Progressing  Goal: Readiness for Transition of Care  Outcome: Progressing  Goal: Rounds/Family Conference  Outcome: Progressing     Problem: Diarrhea  Goal: Fluid and Electrolyte Balance  Outcome: Progressing     Problem: Infection  Goal: Infection Symptom Resolution  Outcome: Progressing     Problem: Pain Acute  Goal: Optimal Pain Control  Outcome: Progressing

## 2017-12-21 NOTE — Unmapped (Signed)
[facility]  Emergency Department Progress Note    December 20, 2017 4:40 PM         Recent VS:  Recent Patient Data       12/20/2017  1411 12/20/2017  1456 12/20/2017  1500       BP:  144/114  126/85  128/83     Pulse:  87  78  77     Temp:  37 ??C (98.6 ??F)  ???  ???     Resp:  28  19  9      SpO2:  98 %  92 %  95 %             This patient, Rodney Bender, was signed out to me today, 12/20/2017 by Dr. Duffy Bruce. I have reviewed the pertinent vital signs, labs, imaging, and prior provider and nursing notes.  Patient is a 47 y.o. male with history of Chron's disease and a recent admission for C. Diff (11/12-11/14) after taking clindamycin for a dental infection who presented with worsening abdominal pain with associated nausea, vomiting, and diarrhea since his d/c.     The patient had labs completed in the ED which are significant for an elevated lactate to 5.3 as well as leukocytosis to 19.7 with a left shift. His ESR, CRP, TSH, and potassium levels are wnl. Patient has been given IVF, zofran, phenergan, and fentanyl for symptomatic management with improvement in symptoms. Patient is pending CT abdomen.  Plan is reevaluate after CT returns. Likely admission given labs and hx.     6:25 PM Paged Med W regarding patient.     7:30 PM???discussed the case with the med W resident who will come down and evaluate the patient, plan for admission pending repeat lactate   Additional Medical Decision Making      I have reviewed the patient's vital signs and the nursing notes. Any pertinent labs & imaging results which were available during my care of the patient were reviewed by me.   I independently visualized the radiology images.   I reviewed the patient's prior medical records (discharge summary).   I discussed the case with the medicine consultant.   I discussed the case and plan for continuity of care with the admitting provider.     Portions of this record have been created using Scientist, clinical (histocompatibility and immunogenetics). Dictation errors have been sought, but may not have been identified and corrected.     ______________________________________________________   Documentation assistance was provided by Margie Billet, Scribe, on December 20, 2017 at 4:40 PM for Dr. Tressie Ellis    Documentation assistance was provided by the scribe in my presence.  The documentation recorded by the scribe has been reviewed by me and accurately reflects the services I personally performed.

## 2017-12-21 NOTE — Unmapped (Signed)
Patient transported to CT Scan  Transported by Radiology  How tranported Stretcher  Cardiac Monitor yes

## 2017-12-21 NOTE — Unmapped (Signed)
Patient returned from CT Scan  Transported by Radiology  How tranported Stretcher  Cardiac Monitor yes

## 2017-12-21 NOTE — Unmapped (Signed)
Order was placed for PIV by Venous Access Team (VAT).  Patient currently has 1 working IV on his right thumb. Ultrasound was used to look for veins on his right arm but patient has poor vasculature, veins are short, small in circumference and next to artery. Patient refused to have his left arm assessed at this time. Patient's nurse Alana informed that patient is not cooperative with the VAT nurse to look for better veins on his left arm. Patient's nurse will place an order for PIV insertion once patient agreed.  2  Workup / Procedure Time:  15 minutes      The primary RN was notified.       Thank you,     Melodye Ped RN Venous Access Team

## 2017-12-21 NOTE — Unmapped (Signed)
MD at bedside.- admit team

## 2017-12-21 NOTE — Unmapped (Signed)
Report given to [employer]. Patient care transferred at this time.

## 2017-12-22 DIAGNOSIS — A0472 Enterocolitis due to Clostridium difficile, not specified as recurrent: Principal | ICD-10-CM

## 2017-12-22 LAB — CBC W/ AUTO DIFF
BASOPHILS ABSOLUTE COUNT: 0.1 10*9/L (ref 0.0–0.1)
BASOPHILS RELATIVE PERCENT: 0.6 %
EOSINOPHILS ABSOLUTE COUNT: 0.1 10*9/L (ref 0.0–0.4)
EOSINOPHILS RELATIVE PERCENT: 1.8 %
HEMATOCRIT: 37.1 % — ABNORMAL LOW (ref 41.0–53.0)
HEMOGLOBIN: 12.2 g/dL — ABNORMAL LOW (ref 13.5–17.5)
LARGE UNSTAINED CELLS: 3 % (ref 0–4)
LYMPHOCYTES ABSOLUTE COUNT: 1.5 10*9/L (ref 1.5–5.0)
MEAN CORPUSCULAR HEMOGLOBIN CONC: 33 g/dL (ref 31.0–37.0)
MEAN CORPUSCULAR HEMOGLOBIN: 31.2 pg (ref 26.0–34.0)
MEAN CORPUSCULAR VOLUME: 94.6 fL (ref 80.0–100.0)
MEAN PLATELET VOLUME: 6.9 fL — ABNORMAL LOW (ref 7.0–10.0)
MONOCYTES ABSOLUTE COUNT: 0.5 10*9/L (ref 0.2–0.8)
NEUTROPHILS ABSOLUTE COUNT: 5.3 10*9/L (ref 2.0–7.5)
NEUTROPHILS RELATIVE PERCENT: 68.6 %
PLATELET COUNT: 335 10*9/L (ref 150–440)
RED BLOOD CELL COUNT: 3.92 10*12/L — ABNORMAL LOW (ref 4.50–5.90)
WBC ADJUSTED: 7.8 10*9/L (ref 4.5–11.0)

## 2017-12-22 LAB — PLATELET COUNT: Lab: 335

## 2017-12-22 LAB — BASIC METABOLIC PANEL
ANION GAP: 12 mmol/L (ref 7–15)
BLOOD UREA NITROGEN: 7 mg/dL (ref 7–21)
BUN / CREAT RATIO: 8
CALCIUM: 9.2 mg/dL (ref 8.5–10.2)
CHLORIDE: 99 mmol/L (ref 98–107)
CO2: 21 mmol/L — ABNORMAL LOW (ref 22.0–30.0)
CREATININE: 0.86 mg/dL (ref 0.70–1.30)
EGFR CKD-EPI AA MALE: >90 mL/min/{1.73_m2} (ref >=60)
EGFR CKD-EPI NON-AA MALE: >90 mL/min/{1.73_m2} (ref >=60)
GLUCOSE RANDOM: 67 mg/dL (ref 65–179)
POTASSIUM: 4 mmol/L (ref 3.5–5.0)
SODIUM: 132 mmol/L — ABNORMAL LOW (ref 135–145)

## 2017-12-22 LAB — CO2: Carbon dioxide:SCnc:Pt:Ser/Plas:Qn:: 21 — ABNORMAL LOW

## 2017-12-22 NOTE — Unmapped (Signed)
Daily Progress Note    24hr Events:  Patient says diarrhea has slowed down- only had one episode throughout the day yesterday (was have 5-6 at home). Still complaining of abdominal pain today.     Assessment/Plan:    Principal Problem:    Clostridium difficile enteritis  Active Problems:    Crohn's disease of both small and large intestine with intestinal obstruction (CMS-HCC)  Resolved Problems:    Crohn's colitis (CMS-HCC)    Rodney Bender is a 47 y.o. male with history of Crohn's disease on Stelara, recent diagnosis of C. Diff colitis on oral vanc (11/12) that presented to [facility] with worsening nausea, vomiting, abdominal pain, diarrhea.    ??  Active Problems:    Crohn's disease of both small and large intestine with intestinal obstruction (CMS-HCC)    Clostridium difficile enteritis  ??  Severe C. Diff infection. Originally diagnosed on 11/12, in the setting of patient taking clindamycin for oral infection. Has been compliant with oral vancomycin. He presented with worsening of n/v/d, with primary reason for presentation being worsening abdominal pain. CRP<5, decreasing suspicion for Crohn's flare. CT A/P with contrast did not demonstrate perforation, obstruction or abscess. Did show mucosal thickening in sigmoid colon and rectum that could be c/w C. Diff colitis. Diarrhea now improving, patient still having abdominal pain.   -- Stopped IV flagyl, continuing oral vanc  -- Pain control: transitioned to oxycodone 10mg  TID (home regimen is percocet 10-325mg )   -- Home meds include: cholestyramine, pepcid. Hold in setting of active C. Diff infection.  ??  Crohn's disease. Does not appear to be in an active flare, supported by CRP<5.   -- Stelara 90mg  q8 weeks   ___________________________________________________________________      Labs/Studies:  Labs per EMR and Reviewed (last 24hrs)    Objective:  BP 149/90  - Pulse 60  - Temp 36.7 ??C  - Resp 19  - Ht 180.3 cm (5' 11)  - Wt 59.2 kg (130 lb 8 oz)  - SpO2 98% - BMI 18.20 kg/m??     Constitutional: NAD, lying comfortably in bed   CV: normal rate, regular rhythm, no murmurs   Pulmonary: normal WOB, no accessory muscle use  Abdominal: soft, non-distended, diffusely tender throughout, +BS  Neurological: no gross abnormalities

## 2017-12-22 NOTE — Unmapped (Signed)
Care Management  Initial Transition Planning Assessment              Per H&P: Rodney Bender is a 47 y.o. male with PMHx as reviewed in the EMR that presented to [facility] with Clostridium difficile enteritis.  ??  General  Care Manager assessed the patient by : In person interview with patient, Medical record review, Discussion with Clinical Care team  Orientation Level: Oriented X4    Contact/Decision Maker        Extended Emergency Contact Information  Primary Emergency Contact: Earnest,Bernadette   [place]  Mobile Phone: [phone]  Relation: Spouse    Legal Next of Kin / Guardian / POA / Advance Directives       Advance Directive (Medical Treatment)  Does patient have an advance directive covering medical treatment?: Patient would not like information., Patient does not have advance directive covering medical treatment.  Reason patient does not have an advance directive covering medical treatment:: Patient does not wish to complete one at this time  Reason there is not a Health Care Decision Maker appointed:: Patient does not wish to appoint a Health Care Decision Maker at this time  Information provided on advance directive:: Yes  Patient requests assistance:: No         Patient Information  Lives with: Spouse/significant other    Type of Residence: Private residence     Type of Residence: Mailing Address:  [address]  [place] [address]  Contacts: Accompanied by: Significant other  Password: [id number]  Family Accommodations: Wife at bedside  Patient Phone Number: [phone] (home)           Medical Provider(s): [facility]  Reason for Admission: Admitting Diagnosis:  C. difficile enteritis [A04.72]  Abdominal pain, unspecified abdominal location [R10.9]  Crohn's disease with complication, unspecified gastrointestinal tract location (CMS-HCC) [K50.919]  Past Medical History:   has a past medical history of Anxiety, Crohn's disease (CMS-HCC), GERD (gastroesophageal reflux disease), and Hypertension (10/08/2016).  Past Surgical History:   has a past surgical history that includes Colon surgery; pr upper gi endoscopy,biopsy (N/A, 07/23/2012); pr colonoscopy w/biopsy single/multiple (07/23/2012); pr colonoscopy flx dx w/collj spec when pfrmd (Left, 11/10/2012); pr upper gi endoscopy,diagnosis (N/A, 11/10/2012); pr upper gi endoscopy,diagnosis (N/A, 07/21/2013); pr colonoscopy flx dx w/collj spec when pfrmd (N/A, 07/21/2013); pr upper gi endoscopy,diagnosis (N/A, 07/15/2014); pr colsc flexible w/transendoscopic balloon dilat (N/A, 07/15/2014); pr colonoscopy w/biopsy single/multiple (N/A, 07/15/2014); pr colonoscopy flx dx w/collj spec when pfrmd (N/A, 08/09/2016); 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   Previous admit date: 01/08/2017    Primary Insurance- Payor: MEDICARE / Plan: MEDICARE PART A AND PART B / Product Type: *No Product type* /   Secondary Insurance ??? Secondary Insurance  MEDICAID [place]  Prescription Coverage ??? Yes  Preferred Pharmacy - [facility]  [id number] - [place] ([place]), [place] - [address]  [facility]    Transportation home: Private vehicle  Level of function prior to admission: Independent        Location/Detail: [address], [place], [place]    Support Systems: Spouse, Family Members    Responsibilities/Dependents at home?: No    Home Care services in place prior to admission?: No          Outpatient/Community Resources in place prior to admission: Clinic  Agency detail (Name/Phone #): [employer]  Services - [facility]    Equipment Currently Used at Home: none       Currently receiving outpatient dialysis?: No       Financial Information       Need for financial assistance?: No       Social Determinants of Health  Social Determinants of Health were addressed in provider documentation.  Please refer to patient history.    Discharge Needs Assessment  Concerns to be Addressed: denies needs/concerns at this time, no discharge needs identified    Clinical Risk Factors: Readmission < 30 Days    Barriers to taking medications: No    Prior overnight hospital stay or ED visit in last 90 days: Yes    Readmission Within the Last 30 Days: unable to assess         Anticipated Changes Related to Illness: none    Equipment Needed After Discharge: none    Discharge Facility/Level of Care Needs: (home self care)    Readmission  Risk of Unplanned Readmission Score: UNPLANNED READMISSION SCORE: 8%  Readmitted Within the Last 30 Days?   Patient at risk for readmission?: No    Discharge Plan  Screen findings are: Care Manager reviewed the plan of the patient's care with the Multidisciplinary Team. No discharge planning needs identified at this time. Care Manager will continue to manage plan and monitor patient's progress with the team.    Expected Discharge Date: 12/23/17    Expected Transfer from Critical Care:      Patient and/or family were provided with choice of facilities / services that are available and appropriate to meet post hospital care needs?: N/A       Initial Assessment complete?: Yes

## 2017-12-22 NOTE — Unmapped (Signed)
Pt is alert and oriented, on room air and remained afebrile during shift. Pt denied any nausea or vomiting this shift. He reported abdominal pain and PRN IV dilaudid was given with relief. Relief did not last till next pain medication. Team paged, awaiting response. Pt refused Lovenox SQ, he said he ambulates and does not need a blood thinner. Enteric precaution maintained. No falls this shift. Will monitor  Problem: Adult Inpatient Plan of Care  Goal: Plan of Care Review  Outcome: Progressing  Goal: Patient-Specific Goal (Individualization)  Outcome: Progressing  Flowsheets (Taken 12/22/2017 0133)  Patient-Specific Goals (Include Timeframe): pt will have pain at a tolerable rate before the end of this shift  Individualized Care Needs: pain management, vital signs  Anxieties, Fears or Concerns: pt has concerns about pain  Goal: Absence of Hospital-Acquired Illness or Injury  Outcome: Progressing  Intervention: Identify and Manage Fall Risk  Flowsheets (Taken 12/22/2017 0152)  Safety Interventions: fall reduction program maintained; environmental modification; isolation precautions  Intervention: Prevent Skin Injury  Flowsheets (Taken 12/22/2017 0152)  Pressure Reduction Techniques: frequent weight shift encouraged  Intervention: Prevent VTE (venous thromboembolism)  Flowsheets (Taken 12/22/2017 0152)  VTE Prevention/Management: ambulation promoted; patient refused intervention  Note:   Pt refused Lovenox sq  Goal: Optimal Comfort and Wellbeing  Outcome: Progressing  Goal: Readiness for Transition of Care  Outcome: Progressing  Goal: Rounds/Family Conference  Outcome: Progressing     Problem: Diarrhea  Goal: Fluid and Electrolyte Balance  Outcome: Progressing  Intervention: Manage Diarrhea  Flowsheets (Taken 12/22/2017 0152)  Fluid/Electrolyte Management: intravenous fluids adjusted  Isolation Precautions: enteric precautions maintained     Problem: Infection  Goal: Infection Symptom Resolution  Outcome: Progressing Intervention: Prevent or Manage Infection  Flowsheets (Taken 12/22/2017 0152)  Isolation Precautions: enteric precautions maintained     Problem: Pain Acute  Goal: Optimal Pain Control  Outcome: Progressing  Intervention: Prevent or Manage Pain  Flowsheets (Taken 12/22/2017 0152)  Sleep/Rest Enhancement: awakenings minimized  Intervention: Optimize Psychosocial Wellbeing  Flowsheets (Taken 12/22/2017 0152)  Supportive Measures: active listening utilized; positive reinforcement provided; verbalization of feelings encouraged  Diversional Activities: television

## 2017-12-23 LAB — CBC W/ AUTO DIFF
BASOPHILS ABSOLUTE COUNT: 0.1 10*9/L (ref 0.0–0.1)
EOSINOPHILS ABSOLUTE COUNT: 0.1 10*9/L (ref 0.0–0.4)
EOSINOPHILS RELATIVE PERCENT: 2.2 %
HEMATOCRIT: 36.8 % — ABNORMAL LOW (ref 41.0–53.0)
HEMOGLOBIN: 12.3 g/dL — ABNORMAL LOW (ref 13.5–17.5)
LARGE UNSTAINED CELLS: 2 % (ref 0–4)
LYMPHOCYTES ABSOLUTE COUNT: 1.7 10*9/L (ref 1.5–5.0)
LYMPHOCYTES RELATIVE PERCENT: 26 %
MEAN CORPUSCULAR HEMOGLOBIN CONC: 33.3 g/dL (ref 31.0–37.0)
MEAN CORPUSCULAR HEMOGLOBIN: 30.9 pg (ref 26.0–34.0)
MEAN CORPUSCULAR VOLUME: 93 fL (ref 80.0–100.0)
MEAN PLATELET VOLUME: 7.1 fL (ref 7.0–10.0)
MONOCYTES ABSOLUTE COUNT: 0.6 10*9/L (ref 0.2–0.8)
MONOCYTES RELATIVE PERCENT: 8.8 %
NEUTROPHILS ABSOLUTE COUNT: 4 10*9/L (ref 2.0–7.5)
NEUTROPHILS RELATIVE PERCENT: 60.3 %
PLATELET COUNT: 346 10*9/L (ref 150–440)
RED BLOOD CELL COUNT: 3.96 10*12/L — ABNORMAL LOW (ref 4.50–5.90)
RED CELL DISTRIBUTION WIDTH: 13.2 % (ref 12.0–15.0)
WBC ADJUSTED: 6.6 10*9/L (ref 4.5–11.0)

## 2017-12-23 LAB — BASIC METABOLIC PANEL
ANION GAP: 9 mmol/L (ref 7–15)
BLOOD UREA NITROGEN: 9 mg/dL (ref 7–21)
BUN / CREAT RATIO: 10
CALCIUM: 9.3 mg/dL (ref 8.5–10.2)
CHLORIDE: 92 mmol/L — ABNORMAL LOW (ref 98–107)
CO2: 29 mmol/L (ref 22.0–30.0)
CREATININE: 0.9 mg/dL (ref 0.70–1.30)
EGFR CKD-EPI NON-AA MALE: >90 mL/min/{1.73_m2} (ref >=60)
GLUCOSE RANDOM: 84 mg/dL (ref 65–179)
POTASSIUM: 4 mmol/L (ref 3.5–5.0)
SODIUM: 130 mmol/L — ABNORMAL LOW (ref 135–145)

## 2017-12-23 LAB — BASOPHILS ABSOLUTE COUNT: Lab: 0.1

## 2017-12-23 LAB — CO2: Carbon dioxide:SCnc:Pt:Ser/Plas:Qn:: 29

## 2017-12-23 MED ORDER — HYDROXYZINE HCL 25 MG TABLET
ORAL_CAPSULE | Freq: Four times a day (QID) | ORAL | 3 refills | 0.00000 days | Status: CP | PRN
Start: 2017-12-23 — End: ?

## 2017-12-23 MED ORDER — ONDANSETRON 4 MG DISINTEGRATING TABLET
ORAL_TABLET | Freq: Three times a day (TID) | ORAL | 0 refills | 0.00000 days | Status: CP | PRN
Start: 2017-12-23 — End: 2018-01-13

## 2017-12-23 MED ORDER — VANCOMYCIN 25 MG/ML ORAL SUSPENSION
ORAL | 3 refills | 0.00000 days | Status: CP
Start: 2017-12-23 — End: 2017-12-23

## 2017-12-23 MED ORDER — VANCOMYCIN 250 MG CAPSULE
ORAL_CAPSULE | 0 refills | 0.00000 days | Status: CP
Start: 2017-12-23 — End: 2017-12-30

## 2017-12-23 NOTE — Unmapped (Signed)
pt current bp is 160/106. he reports headache, abdominal pain. he is not on a blood pressure medication. MDW paged

## 2017-12-23 NOTE — Unmapped (Signed)
Physician Discharge Summary    Identifying Information:   Rodney Bender - [facility]  1970/12/08  [id number]    Admit date: 12/20/2017    Discharge date: 12/23/2017     Discharge Service: Med General [facility] (MDW)    Discharge Attending Physician: [name], MD    Discharge to: Home    Discharge Diagnoses:  Principal Problem:    Clostridium difficile enteritis  Active Problems:    Crohn's disease of both small and large intestine with intestinal obstruction (CMS-HCC)  Resolved Problems:    Crohn's colitis (CMS-HCC)      Hospital Course:      47yo gentleman with PMHx of Crohn's and recent diagnosis of C.Diff who was admitted for diarrhea, concerning for treatment-failure of C.Diff.     Severe C. Diff infection.??Originally diagnosed on 11/12, in the setting of patient taking clindamycin for oral infection. Started on 125mg  q6h, but presented on 11/24 with n/v/d. CRP<5, decreasing suspicion for Crohn's flare. CT A/P with contrast did not demonstrate perforation, obstruction or abscess. Did show mucosal thickening in sigmoid colon and rectum that could be c/w C. Diff colitis. Started on IV Flagyl + PO Vancomycin and rapidly improved. Having ~1-2 formed BM's per day on the day of discharge, and was discharged on PO Vancomycin taper (125mg  q6h x14 days; 125mg  BID x7 days, 125mg  qd x7 days, 125mg  every other day x4 weeks. Tolerating PO intake on day of discharge, abdominal pain back to baseline.   ??  Crohn's disease.??Does not appear to be in an active flare, supported by CRP<5. Will continue Stelara outpatient and continue to f/w [facility].    Post Discharge Follow Up Issues:   - Continue to follow with Pain Clinic for ongoing chronic pain  - Continue to follow with [facility]     Procedures:  No admission procedures for hospital encounter.  _____________________________________________________________________________  Discharge Day Services:  BP 158/98  - Pulse 61  - Temp 36.8 ??C (Oral)  - Resp 18  - Ht 180.3 cm (5' 11) - Wt 59.2 kg (130 lb 8 oz)  - SpO2 95%  - BMI 18.20 kg/m??   Pt seen on the day of discharge and determined appropriate for discharge.    Condition at Discharge: good    Length of Discharge: I spent greater than 30 mins in the discharge of this patient.  _____________________________________________________________________________  Discharge Medications:     Your Medication List      START taking these medications    hydrOXYzine 25 MG tablet  Commonly known as:  ATARAX  Take 1 tablet (25 mg total) by mouth every six (6) hours as needed.     ondansetron 4 MG disintegrating tablet  Commonly known as:  ZOFRAN-ODT  Take 1 tablet (4 mg total) by mouth every eight (8) hours as needed for nausea. for up to 7 days        CHANGE how you take these medications    vancomycin 250 MG capsule  Commonly known as:  VANCOCIN  Take according to prescribed taper.  What changed:    ?? how much to take  ?? how to take this  ?? when to take this  ?? additional instructions        CONTINUE taking these medications    dronabinol 10 MG capsule  Commonly known as:  MARINOL  Take 1 capsule (10 mg total) by mouth Two (2) times a day (30 minutes before a meal).     empty container Misc  Use as directed     oxyCODONE-acetaminophen 10-325 mg per tablet  Commonly known as:  PERCOCET  Take 1 tablet by mouth every eight (8) hours as needed for pain.     STELARA 90 mg/mL Syrg syringe  Generic drug:  ustekinumab  Inject 1 mL (90 mg total) under the skin every 8 weeks.          _____________________________________________________________________________  Pending Test Results (if blank, then none):   Order Current Status    ED Extra Tubes In process    PINK EXTRA TUBE In process    Blood Culture #2 Preliminary result          Most Recent Labs:  Microbiology Results (last day)     Procedure Component Value Date/Time Date/Time    Blood Culture #2 [[id number]] Collected:  12/20/17 1518    Lab Status:  Preliminary result Specimen:  Blood from 1 Peripheral Draw Updated:  12/22/17 1530     Blood Culture, Routine No Growth at 48 hours          Lab Results   Component Value Date    WBC 6.6 12/23/2017    HGB 12.3 (L) 12/23/2017    HCT 36.8 (L) 12/23/2017    PLT 346 12/23/2017       Lab Results   Component Value Date    NA 130 (L) 12/23/2017    K 4.0 12/23/2017    CL 92 (L) 12/23/2017    CO2 29.0 12/23/2017    BUN 9 12/23/2017    CREATININE 0.90 12/23/2017    CALCIUM 9.3 12/23/2017    MG 1.4 (L) 10/23/2016    PHOS 3.0 10/23/2016       Lab Results   Component Value Date    ALKPHOS 128 (H) 12/20/2017    BILITOT 0.9 12/20/2017    BILIDIR 0.30 09/12/2017    PROT 8.7 (H) 12/20/2017    ALBUMIN 5.0 12/20/2017    ALT 23 12/20/2017    AST 33 12/20/2017    GGT 32 01/15/2011       Lab Results   Component Value Date    PT 18.1 (H) 09/25/2016    INR 1.59 09/25/2016    APTT 28.6 09/25/2016     Hospital Radiology:  Ct Abdomen Pelvis With Iv Contrast Only    Result Date: 12/20/2017  EXAM: CT ABDOMEN PELVIS W CONTRAST DATE: 12/20/2017 4:39 PM ACCESSION: [id number] UN DICTATED: 12/20/2017 5:00 PM INTERPRETATION LOCATION: [facility] CLINICAL INDICATION: ABDOMINAL PAIN, (specify site in comments) -  hx Crohn's  COMPARISON: 03/12/2017 TECHNIQUE: A spiral CT scan of the abdomen and pelvis was obtained with IV contrast from the lung bases through the pubic symphysis. Images were reconstructed in the axial plane. Coronal and sagittal reformatted images were also provided for further evaluation. FINDINGS: LOWER THORAX: Subsegmental bilateral lower lobe atelectasis. HEPATOBILIARY: No focal hepatic lesions. The gallbladder is present and otherwise unremarkable. No biliary dilatation.  SPLEEN: Unremarkable. PANCREAS: Unremarkable. ADRENALS: Unremarkable. KIDNEYS/URETERS: Subcentimeter bilateral renal hypodensities, too small to characterize and unchanged. BLADDER: Unremarkable. PELVIC/REPRODUCTIVE ORGANS: Unremarkable. GI TRACT: Sequela of prior ileocecectomy. Mucosal thickening and enhancement in the left hemiabdomen small bowel with associated engorged mesenteric vessels. Mucosal wall thickening in the sigmoid colon and rectum (2:90, 106) which may be secondary to underdistention versus active inflammation.  PERITONEUM/RETROPERITONEUM AND MESENTERY: No free air or fluid. LYMPH NODES: No enlarged lymph nodes. VESSELS: The aorta is normal in caliber.  No significant calcified atherosclerotic disease. The portal venous system is patent. The  hepatic veins and IVC are unremarkable. BONES AND SOFT TISSUES: No acute osseous abnormality.     Mucosal thickening and enhancement in the left midabdomen small bowel compatible with enteritis, likely from Crohn's flare. Areas of mucosal thickening in the sigmoid colon and rectum without significant associated fat stranding, may be due to underdistention versus inflammation secondary to Crohn's versus C. difficile colitis could have a similar appearance. No evidence of obstruction or abscess. ==================== ATTENDING ADDENDUM (12/20/2017 7:50 PM): - Agree with above questioned segments of colon that demonstrates borderline wall thickening in a pattern that could represent either underdistention or mild infectious/inflammatory changes given history of C. difficile. - Small bowel appears unremarkable, without evidence of active inflammatory bowel disease.    Xr Abdomen 1 View    Result Date: 12/22/2017  EXAM: XR ABDOMEN 1 VIEW DATE: 12/22/2017 6:53 PM ACCESSION: [id number] UN DICTATED: 12/22/2017 7:01 PM INTERPRETATION LOCATION: [facility] CLINICAL INDICATION: 47 years old Male with ABDOMINAL PAIN  -  UNSPECIFIED SITE  COMPARISON: CT abdomen/pelvis dated 12/20/2017 and prior. TECHNIQUE: Supine view of the abdomen. FINDINGS: Nonobstructed bowel gas pattern. There are a few mildly prominent large bowel loops overlying the upper pelvis without definitive dilation. Surgical suture line overlying the right hemiabdomen. Visualized portions of the lung bases are unremarkable. -- Nonobstructed bowel gas pattern.      _____________________________________________________________________________  Discharge Instructions:   Activity Instructions     Activity as tolerated                    Follow Up instructions and Outpatient Referrals     Call MD for:  persistent nausea or vomiting      Call MD for: Temperature > 38.5 Celsius ( > 101.3 Fahrenheit)      Discharge instructions      It was a pleasure taking care of you. Please take the Oral Vancomycin as prescribed in this taper, and continue to follow with Dr. Elizebeth Brooking and your GI Team. We sent a referral to see our Primary Care Physicians here at the [facility], and they should be contacting you in the coming days regarding appointment options.    Vancomycin taper:   - 125mg  Oral 4 times a day for 14 days.   - Then 125mg  Oral twice daily for 7 days.   - Then 125mg  daily for 7 days.   - Then 125mg  every other day for 4 weeks.

## 2017-12-23 NOTE — Unmapped (Signed)
Pt is alert and oriented, on room air and remained afebrile during shift. BP was 160/106, Provider notified, no medication prescribed. Pt has a history of Pt is not satisfied for pain treatment he stated that he takes 10mg  oxycodone q4hrs while he is scheduled to get same doe q8hrs here. Medication was adjusted to q6hrs. No falls this shift. Will monitor  Problem: Adult Inpatient Plan of Care  Goal: Plan of Care Review  Outcome: Progressing  Goal: Patient-Specific Goal (Individualization)  Outcome: Progressing  Flowsheets  Taken 12/22/2017 2348  Patient-Specific Goals (Include Timeframe): pt will be educated about pain regime before the end of shift  Taken 12/22/2017 0133  Individualized Care Needs: pain management, vital signs  Anxieties, Fears or Concerns: pt has concerns about pain  Goal: Absence of Hospital-Acquired Illness or Injury  Outcome: Progressing  Intervention: Identify and Manage Fall Risk  Flowsheets (Taken 12/22/2017 2348)  Safety Interventions: environmental modification; isolation precautions  Intervention: Prevent Infection  Flowsheets (Taken 12/22/2017 2348)  Infection Prevention: handwashing promoted  Goal: Optimal Comfort and Wellbeing  Outcome: Progressing  Intervention: Monitor Pain and Promote Comfort  Flowsheets (Taken 12/22/2017 2348)  Pain Management Interventions: awakened for pain meds per patient request; diversional activity provided  Goal: Readiness for Transition of Care  Outcome: Progressing  Goal: Rounds/Family Conference  Outcome: Progressing     Problem: Diarrhea  Goal: Fluid and Electrolyte Balance  Outcome: Progressing     Problem: Infection  Goal: Infection Symptom Resolution  Outcome: Progressing     Problem: Pain Acute  Goal: Optimal Pain Control  Outcome: Progressing  Intervention: Optimize Psychosocial Wellbeing  Flowsheets (Taken 12/22/2017 2348)  Supportive Measures: active listening utilized; positive reinforcement provided; verbalization of feelings encouraged

## 2017-12-23 NOTE — Unmapped (Signed)
Adult Nutrition Assessment Note    Visit Type: RN Consult  Reason for Visit: Have you gained or lost 10 pounds in the past 3 months?, Per Admission Nutrition Screen (Adult), Have you had a decrease in food intake or appetite?    ASSESSMENT:   HPI & PMH: 47 y.o.??male??with history of Crohn's disease on Stelara, recent diagnosis of C. Diff colitis on oral vanc (11/12)??that presented to [facility] with worsening nausea, vomiting, abdominal pain, diarrhea.    Nutrition Hx: Patient reports decreased PO intakes related to poor appetite over the past month PTA. He denies recent weight loss, reports often he would just consume 1 meal per day. He reports typically having 7-8 loose bowel movements daily over this time. He declined ONS during RD visit today    Nutritionally Pertinent Meds: compazine, abx, pain   Labs:  Na 132   Abd/GI: soft/tender BM 11/24     Patient Lines/Drains/Airways Status    Active Wounds     None               Current nutrition therapy order:   Nutrition Orders          Nutrition Therapy General (Regular) starting at 11/23 2105         Anthropometric Data:  -- Height: 180.3 cm (5' 11)   -- Last recorded weight: 59.2 kg (130 lb 8 oz)  -- Admission weight: 59.2 kg (130 lb 8 oz)  -- IBW: 78.08 kg  -- Percent IBW: 75.81 %  -- BMI: Body mass index is 18.2 kg/m??.   -- Weight changes this admission:   Last 5 Recorded Weights    12/20/17 2100   Weight: 59.2 kg (130 lb 8 oz)      -- Weight history PTA:   Wt Readings from Last 10 Encounters:   12/20/17 59.2 kg (130 lb 8 oz)   12/09/17 57.8 kg (127 lb 6.8 oz)   09/12/17 55.5 kg (122 lb 4.8 oz)   05/09/17 53.3 kg (117 lb 9.6 oz)   03/21/17 59 kg (130 lb)   03/14/17 57.5 kg (126 lb 12.8 oz)   01/09/17 53.1 kg (117 lb 1.6 oz)   11/25/16 58.5 kg (129 lb)   11/15/16 57.4 kg (126 lb 8.7 oz)   11/08/16 57.2 kg (126 lb)        Daily Estimated Nutrient Needs:   Energy: 1575-1770 kcals [25-30 kcal/kg using admission body weight, 59 kg (12/22/17 1614)]  Protein: 59-71 gm [1.0-1.2 gm/kg using admission body weight, 59 kg (12/22/17 1614)]  Carbohydrate:   [45-60% of kcal]  Fluid:   [per MD team]     Nutrition Focused Physical Exam:  Fat Areas Examined  Orbital: No loss  Upper Arm: No loss  Thoracic: No loss      Muscle Areas Examined  Temple: No loss  Clavicle: No loss  Acromion: No loss  Scapular: No loss  Dorsal Hand: No loss  Patellar: No loss  Anterior Thigh: No loss  Posterior Calf: No loss         Nutrition Evaluation  Overall Impressions: No fat loss;No muscle loss (12/22/17 1615)  Nutrition Designation: Underweight (BMI < 18.50  kg/m2) (12/22/17 1615)     DIAGNOSIS:  Malnutrition Assessment using AND/ASPEN Clinical Characteristics:    Patient does not meet AND/ASPEN criteria for malnutrition at this time (12/22/17 1615)       Overall nutrition impression:    - Inadequate protein-energy intake related to decreases appetite, insensable losses with  diarhea  as evidenced by patient reported diet recall.     GOALS:  Oral Intake:       - Patient to consume 60% of 5-6 small meals per day.  Anthropometric:       - Maintain weight within 5% of 59 kg over course of hospitalization.  Laboratory Data:       - Improvement in gastrointestinal labs (sodium).     RECOMMENDATIONS AND INTERVENTIONS:  Recommend continue current nutrition therapy      RD Follow Up Parameters:  1-2 times per week (and more frequent as indicated)     Gladys Damme MS, RD, LD, CNSC  [phone]     .

## 2017-12-23 NOTE — Unmapped (Signed)
Patient is A&O x 4. VSS, afebrile. Able to communicate needs well with staff. Tolerates PO medications well. Pain managed with PRN medications (see MAR). Patient requested pain medicine not on his MAR, MD paged x 2.  Pt continued to be upset, when nurse came into room, pt was cursing and yelling at this nurse. Pt threw off sheets and menaced threateningly from the bed and finally yelled, Get the fuck out of here. The patient then threw his room phone against the wall. MD was notified, came to room, but pt refused to speak with MD.  IV intact, flushed, and infusing KVO. Pt continues to rest in bed, refusing staff to come into room. No falls noted this shift. Continues resting in bed, call bell within reach, bed in lowest position. Will continue to monitor.     Problem: Adult Inpatient Plan of Care  Goal: Plan of Care Review  Outcome: Ongoing - Unchanged  Flowsheets (Taken 12/21/2017 0244 by Lenon Ahmadi, RN)  Progress: improving  Plan of Care Reviewed With: patient;spouse  Goal: Patient-Specific Goal (Individualization)  Outcome: Ongoing - Unchanged  Flowsheets (Taken 12/22/2017 0133 by Evlyn Clines, RN)  Patient-Specific Goals (Include Timeframe): pt will have pain at a tolerable rate before the end of this shift  Individualized Care Needs: pain management, vital signs  Anxieties, Fears or Concerns: pt has concerns about pain  Goal: Absence of Hospital-Acquired Illness or Injury  Outcome: Ongoing - Unchanged  Intervention: Identify and Manage Fall Risk  Flowsheets (Taken 12/22/2017 1500 by Druscilla Brownie, CNA)  Safety Interventions: fall reduction program maintained;low bed;environmental modification  Intervention: Prevent Skin Injury  Flowsheets (Taken 12/22/2017 1500 by Druscilla Brownie, CNA)  Pressure Reduction Techniques: frequent weight shift encouraged  Intervention: Prevent VTE (venous thromboembolism)  Flowsheets (Taken 12/22/2017 0152 by Evlyn Clines, RN)  VTE Prevention/Management: ambulation promoted;patient refused intervention  Goal: Optimal Comfort and Wellbeing  Outcome: Ongoing - Unchanged  Intervention: Monitor Pain and Promote Comfort  Flowsheets (Taken 12/22/2017 1624)  Pain Management Interventions: care clustered  Intervention: Provide Person-Centered Care  Flowsheets (Taken 12/22/2017 1624)  Trust Relationship/Rapport: care explained  Goal: Readiness for Transition of Care  Outcome: Ongoing - Unchanged  Intervention: Mutually Develop Transition Plan  Flowsheets (Taken 12/22/2017 1429 by Milinda Antis, MSW)  Discharge Facility/Level of Care Needs:  (home self care)  Equipment Needed After Discharge: none  Equipment Currently Used at Home: none  Anticipated Changes Related to Illness: none  Concerns to be Addressed: denies needs/concerns at this time;no discharge needs identified  Readmission Within the Last 30 Days: unable to assess  Goal: Rounds/Family Conference  Outcome: Ongoing - Unchanged  Flowsheets (Taken 12/22/2017 1429 by Milinda Antis, MSW)  Clinical EDD (Estimated Discharge Date) : 12/23/17     Problem: Infection  Goal: Infection Symptom Resolution  Outcome: Ongoing - Unchanged  Intervention: Prevent or Manage Infection  Flowsheets (Taken 12/22/2017 0800 by Jilda Panda)  Isolation Precautions: enteric precautions maintained     Problem: Pain Acute  Goal: Optimal Pain Control  Outcome: Ongoing - Unchanged  Intervention: Develop Pain Management Plan  Flowsheets (Taken 12/22/2017 1624)  Pain Management Interventions: care clustered  Intervention: Prevent or Manage Pain  Flowsheets (Taken 12/22/2017 0152 by Evlyn Clines, RN)  Sleep/Rest Enhancement: awakenings minimized  Intervention: Optimize Psychosocial Wellbeing  Flowsheets (Taken 12/22/2017 0152 by Evlyn Clines, RN)  Supportive Measures: active listening utilized;positive reinforcement provided;verbalization of feelings encouraged  Diversional Activities: television

## 2017-12-29 NOTE — Unmapped (Signed)
Confirmed:  The Hospital Follow-Up Enhanced Care Team spoke to Rodney Bender on December 29, 2017 to remind the patient of their upcoming appointment with the Hospital Follow-Up team on 12/30/2017. The patient confirmed they will be attending their follow up appointment and confirmed having transportation to the visit. Pt was encouraged to bring all of their medications to the visit.

## 2017-12-29 NOTE — Unmapped (Signed)
REASON FOR VISIT: Hospital Follow-up Care Management    ASSESSMENT AND PLAN:  #Mental Health Services  LCSW to explore options for mental health services in his area and follow up w/him with information found. Pt provided w/contact information for crisis lines available should he find this helpful in the future.    #Eye Glasses  Pt informed that a note will be placed to new PCP appointment schedule so that a referral can be placed to the eye center so pt can be evaluated for a new pair of glasses.    #Advance Directives  Patient declined information about advanced directives at this time.    #Contact information  Pt provided w/Patient Resource guide including After Hours, Same Day, Urgent Care, mental health, [facility] and SW contact information. Pt's contact information was updated in their chart.    #F/u calls  Pt will receive 2 weeks of telephone f/u from a care team member following visit. Pt encouraged to contact SW should needs arise.    Visit discussed directly w/ Forde Radon, PharmD, CPP and Dr. Rose Fillers who will also see pt during the visit.    HISTORY:   Rodney Bender presents to the Internal Medicine clinic for hospital f/u. He reports feeling better since discharge. Denies concerning symptoms at this time.    Pt lives with their spouse in a house. He denies issues with home safety and reports having lived there for three years.  Describes sufficient social support with family and friends. Family, social and cultural characteristics were assessed during the visit and plan.     He is not set up with home health or personal care services. Denies need.    Denies use of medical equipment at this time. Denies need.    Identified barriers to care:  Denies affordability, pt is receiving disability benefits. His wife works.   Denies transportation barriers, pt is able to drive and shares his wife can provide him with transportation if needed.  Denies food Insecurity  Denies auditory communication challenges Reports visual communication challenges- his glasses are scratched and he is in need of a new pair  Denies illiteracy     Mental Health/Substance Use:  Alcohol use: no  Illicit Substance use: denies  Tobacco use:  reports that he quit smoking about 6 months ago. His smoking use included cigarettes. He started smoking about 14 years ago. He has a 18.00 pack-year smoking history. He has never used smokeless tobacco.  PHQ9: Scored 7, negative for SI and HI. Pt describes significant difficulty with mood since he left the hospital. Describes that he feels that his anxiety symptoms have been much worse than his depressive symptoms. Endorses panic attack and heightened anxiety which has been bothersome. Shares also he has had difficulty sleeping. Reports that when he was established with Bernestine Amass he was referred to a psychiatrist but never established. He is interested in establishing with supports now and is receptive to obtaining information for this.    Advanced Directives: does not have on file.  ___________________________________________________   Emeline Gins, MSW, LCSW  Care Manager - [facility] Internal Medicine  Phone: [phone]  Pager: [phone]

## 2017-12-30 ENCOUNTER — Ambulatory Visit: Admit: 2017-12-30 | Discharge: 2017-12-30 | Payer: MEDICARE

## 2017-12-30 ENCOUNTER — Ambulatory Visit: Admit: 2017-12-30 | Discharge: 2017-12-30 | Payer: MEDICARE | Attending: Family | Primary: Family

## 2017-12-30 DIAGNOSIS — F419 Anxiety disorder, unspecified: Secondary | ICD-10-CM

## 2017-12-30 DIAGNOSIS — A0472 Enterocolitis due to Clostridium difficile, not specified as recurrent: Secondary | ICD-10-CM

## 2017-12-30 DIAGNOSIS — K50812 Crohn's disease of both small and large intestine with intestinal obstruction: Principal | ICD-10-CM

## 2017-12-30 DIAGNOSIS — M7541 Impingement syndrome of right shoulder: Principal | ICD-10-CM

## 2017-12-30 MED ORDER — VANCOMYCIN 250 MG CAPSULE
ORAL_CAPSULE | 0 refills | 0 days | Status: CP
Start: 2017-12-30 — End: ?

## 2017-12-30 MED ORDER — CITALOPRAM 20 MG TABLET
ORAL_TABLET | Freq: Every day | ORAL | 3 refills | 0.00000 days | Status: CP
Start: 2017-12-30 — End: 2018-12-30

## 2017-12-30 NOTE — Unmapped (Addendum)
Medicine Changes:  ?? Stop venlafaxine (this may be worsening insomnia, nausea, vomiting, and diarrhea)  ?? Start citalopram 20 mg - Take 1 by mouth every day  ?? If hydroxyzine not helpful then would only use at night, or stop completely    Vancomycin Taper Instructions:  ?? 11/27 to 12/10 Take 1 capsule four times per day  ?? 12/11 to 12/17 Take 1 capsule two times per day  ?? 12/18 to 12/24 Take 1 capsule one time per day  ?? 12/25 to 1/21 Take 1 capsule every other day    Follow-up:   ?? Schedule follow-up on the way out with Dr. Glorianne Manchester    Call or Come Back to Clinic if:   ?? If worsening diarrhea, or dehydration        IMPORTANT NUMBERS  Care Manager  I???m having trouble with my health because of cost, my mood, trouble getting to clinic, or where I live. How can I get help?  ? Ferol Luz, MSW, LCSW, your Care Manager can help!  ? She is available Monday-Friday 8:00AM -5:00PM  ? Call [phone]     Financial Counselor: [phone]    Same Day Clinic   I'm sick today and need an appointment during office hours. Who do I call?  ?? Call [phone] or toll free [phone], ask for an appointment in the Same Day Clinic  ?? Same Day Clinic is located here in the [facility]    After Hours, Weekend, or Holidays:  It's after 5:00pm during the week or it's the weekend. How do I get medical attention?  ?? Call the [facility] 24/7 Nursing Line [phone] to get nurse advice.  ?? Go to [facility] walk-in clinic at [address], [address], [place], [place]; [phone]; 7 days a week from 9:00AM - 8:00PM.    How do I schedule an appointment or get a message to my doctor?  Call [phone] or toll free [phone].    How do I cancel or reschedule an appointment?  ?? Call [phone] or toll free [phone].   You may also cancel your appointment by using [facility] (patient portal).    How do I request medication refills?  Request a refill via [facility] (patient portal), call clinic at [phone] or have your pharmacy fax the request to [phone].

## 2017-12-30 NOTE — Unmapped (Signed)
ORTHOPAEDIC CLINIC NOTE       Rodney Sanger, FNP-BC  [phone]        Rodney Bender [facility]         December 30, 2017  MRN [id number]  DOB 28-Jun-1970    Assessment:    ICD-10-CM   1. Impingement syndrome of right shoulder M75.41       Plan:  Patient with persistent right shoulder pain, on exam the main pain generator does seem to be predominantly from the shoulder.  He examines as though he may have mild to decent capsulitis    I recommended a course of physical therapy, patient declined    I also recommended trial of more conservative measures such as an ultrasound-guided glenohumeral injection with attending sports medicine physician.    Requested Prescriptions      No prescriptions requested or ordered in this encounter      Orders Placed This Encounter   Procedures   ??? AMB REFERRAL TO PHYSICAL THERAPY       RTC: Return [place].  Xrays: None    Reason for Visit: Persistent right shoulder pain    History of present illness: Rodney Bender is a 47 year old right-hand-dominant male who presents to the walk-in orthopedic clinic complaining of persistent right shoulder pain.  Past medical history includes Crohn's disease and chronic pain syndrome.  He is followed by pain clinic.  He also has a history of degenerative disc disease in the cervical spine and underwent anterior cervical decompression with Dr. Kathaleen Grinder, neurosurgeon through the [facility] health system on 02/12/2013.  Patient initially presented to [facility] orthopedics on 03/10/2017 complaining of atraumatic right shoulder pain.  He was given a subacromial steroid injection with only a few weeks of relief, he declined physical therapy at that time.  There was some concern by my colleague Arlester Marker, PA that his main pain generator was a recurrent cervical radiculitis.  Patient reports that he was seen by his neurosurgeon recently and was told that it is likely related to his shoulder and not a cervical radicular pain.    Patient did undergo MRI of the right shoulder on 04/09/2017 which demonstrated tiny intrasubstance insertional supraspinatus tear with surrounding tenosynovitis.  Also thickened anterior inferior glenohumeral ligament without surrounding soft tissue edema suggestive of synovitis/adhesive capsulitis        ROS: 10 system review negative except per HPI.    Physical Exam: Gen.: Rodney Bender is a  47 y.o. thin male in moderate discomfort but no acute distress.  Alert and oriented  Extremities:   RUE: Inspection of the right shoulder is unremarkable.  He is exquisitely tender through the shoulder.  He has no significant tenderness through the cervical spine.  He is very guarded on his exam and declines to fully motion the shoulder, I can passively elevate his arm to about 150 degrees, external rotation to about 35 degrees and into rotation and abduction to the PSIS.  Difficult to assess for rotator cuff strength given poor cooperation on his exam but he does seem to be very uncomfortable with testing of the supraspinatus.  As it of impingement maneuvers in the shoulder.  Also subtly positive Spurling's.  Brisk upper extremity reflexes equal to the contralateral side.  SILT along the Axillary/M/U/R nerve distributions. + OK (AIN median n), EPL (PIN radial n), IO (ulnar n). . Skin is WDI.  Radial pulses 2+ b/l.     LUE: Inspection of the left shoulder is unremarkable.  He has no  apparent tenderness.  Full elevation and abduction 170 degrees, external rotation 50 degrees and internal Tatian abduction to L1.  Full rotator cuff strength without significant pain.  Negative impingement maneuvers.  Negative Spurling's.SILT along the Axillary/M/U/R nerve distributions. + OK (AIN median n), EPL (PIN radial n), IO (ulnar n). . Skin is WDI.  Radial pulses 2+ b/l.           Imaging: No new x-rays taken today    *Patient note was created using Office manager. Any errors in syntax or even information may not have been identified and edited on initial review prior to signing this note.

## 2017-12-30 NOTE — Unmapped (Signed)
REASON FOR VISIT: Hospital Follow-up Medication Management    ASSESSMENT AND PLAN:  #c diff  - consulted with Dr. Tanja Port who was the attending on his inpatient team; intention was for pt to taper using 250mg  cap, not 125mg   - updated taper instructions provided in AVS  - pt will #91 capsules, not #80 originally prescribed. Only #68 dispensed on last fill. Recommend sending rx for #23 to allow for 1 more fill at the pharmacy to complete the course of treatment    #anxiety  - unclear if he is taking venlafaxine (pt did not report, but was found on fill hx); may be contributing to nausea, insomnia, and diarrhea. Consider d/c and trial of citalopram 20mg  PO daily  - hydroxyzine doesn't seem to be helping  - defer to Dr. Rose Fillers    #pain  - managed by outside clinic  - did confirm pt has Narcan available for risk mitigation    #Medication Management  - Medications prescribed or ordered upon discharge were reviewed today and reconciled with the most recent outpatient medication list.  Medication reconciliation was conducted by a prescribing practitioner, or clinical pharmacist.   - Reviewed the indication, dose, and frequency of each medication with patient    Recommendations and medication-related problems were discussed directly with Dr. Rose Fillers who will see Rodney Bender immediately following this visit. Updated medication list and medication changes will be provided to the patient as a printed after visit summary.    HISTORY OF PRESENT ILLNESS:   Rodney Bender is a 47 y.o. year old male with Crohn's who was recently hospitalized for severe c diff, presents to the Internal Medicine clinic for hospital follow up.     Today, he denies n/v/d.    [name] did not bring medication bottles and denies missed doses of medications.    Patient manages medications at home.     Patient does not have to do without their medications because of cost.     DISCREPANCIES NOTED DURING MED REVIEW:  1.   D/C summary says 125mg  capsules, while the Rx was for 250mg  capsules  2. Pt reports that he does have narcan available if needed  3. Fill hx suggests he is taking Venlafaxin 75mg  daily, but pt did not report this med    MEDICATIONS AND ALLERGIES:   Reviewed and updated in EPIC      ___________________________________________________   Jeanella Flattery, PharmD, CPP

## 2017-12-30 NOTE — Unmapped (Signed)
Internal Medicine Hospital Follow-Up Visit    ASSESSMENT / PLAN:  Things to follow-up at next visit:   1. Changed from effexor to celexa, did symptoms of insomnia and nausea improve  2. Blood pressure elevated today, however this could have been related to effexor also.      1. Crohn's disease of both small and large intestine with intestinal obstruction (CMS-HCC)    2. Clostridium difficile enteritis    3. Anxiety      #Hospital follow-up for severe C. Diff enteritis and Crohn's  -see dosing schedule below to finish vancomycin  -Has FU in GI on 2/2    Elevated blood pressure  -Recheck at next visit after discontinuing effexor    #Anxiety  -Stopped effexor due to insomnia, N/V and elevated BP   -Start celexa 20 mg once daily  -Patient open to seeing a psychiatrist/counseling so may want to consider this  -May want to consider adding remeron as he has difficulty with poor appetite for which he takes marinol    #chronic pain  -seen by pain clinic in [place]    FU to establish care with Dr. Glorianne Manchester    Medication adjustments:   ?? Stop venlafaxine (this may be worsening insomnia, nausea, vomiting, and diarrhea)  ?? Start citalopram 20 mg - Take 1 by mouth every day  ?? If hydroxyzine not helpful then would only use at night, or stop completely    Vancomycin Taper Instructions:  ?? 11/27 to 12/10 Take 1 capsule four times per day  ?? 12/11 to 12/17 Take 1 capsule two times per day  ?? 12/18 to 12/24 Take 1 capsule one time per day  ?? 12/25 to 1/21 Take 1 capsule every other day    Patient provided with an updated and reconciled medications list.    Care Management and Support  Identified treatment goals: Reduce preventable readmission    Patient-stated health goal: Reduce anxiety    Patient identified the following potential barriers to meeting these goals: none    To assist patient in meeting their identified goals, I provided him/her with information on the following: N/A    Patient understands diagnosis and treatment plan. Patient voices understanding. Plan is consistent with the patient???s preferences and goals.      Follow Up: Return in about 3 weeks (around 01/20/2018), or establish care with [name].      Future Appointments   Date Time Provider Department Center   01/01/2018  8:30 AM Gonzella Lex, MD [facility]   03/06/2018  3:00 PM Hunt Oris, MD [facility]         CHIEF COMPLAINT: Hospital Follow-Up for Establish Care; Crohn's Disease (Still has n/v); Shouler Pain (right shoulder, MRI done 6 months ago); and Flu Vaccine (Declines)      Date of Hospitalization Discharge:  12/23/17     Reviewed: Discharge summary and Labs; See results in Epic  Discussed care with: Social worker and Forensic psychologist by phone or other method (Encounter type: Patient Outreach):  Patient Outreach History (Since 12/16/2017)     Transition of Care     Date Method of Outreach Associated Actions User Next Outreach    12/24/2017 11:23 AM Telephone  Theresa Duty, RN                Successful contact made or 2 unsuccessful attempts within 2 business days  History obtained from: Patient    HISTORY OF PRESENT ILLNESS:  Summary of hospitalization:   Rodney Bender is a 47 y.o. male with Crohn's disease who was recently hospitalized for C. Diff enteritis.      Principal Problem:    Clostridium difficile enteritis  Active Problems:    Crohn's disease of both small and large intestine with intestinal obstruction (CMS-HCC)  Resolved Problems:    Crohn's colitis (CMS-HCC)  ??  ??  Hospital Course:    ??  47yo gentleman with PMHx of Crohn's and recent diagnosis of C.Diff who was admitted for diarrhea, concerning for treatment-failure of C.Diff.   ??  Severe C. Diff infection.??Originally diagnosed on 11/12, in the setting of patient taking clindamycin for oral infection. Started on 125mg  q6h, but presented on 11/24 with n/v/d. CRP<5, decreasing suspicion for Crohn's flare. CT A/P with contrast did not demonstrate perforation, obstruction or abscess. Did show mucosal thickening in sigmoid colon and rectum that could be c/w C. Diff colitis.??Started on IV Flagyl + PO Vancomycin and rapidly improved. Having ~1-2 formed BM's per day on the day of discharge, and was discharged on PO Vancomycin taper (125mg  q6h x14 days; 125mg  BID x7 days, 125mg  qd x7 days, 125mg  every other day x4 weeks. Tolerating PO intake on day of discharge, abdominal pain back to baseline.   ??  Crohn's disease.??Does not appear to be in an active flare, supported by CRP<5. Will continue [facility] outpatient and continue to f/w [facility].    Additional PMHx:  Chronic neck pain; Chronic abdominal pain; Cervical radiculopathy; Shortness of breath; Hypertension; Cold; GERD (gastroesophageal reflux disease); Headache(784.0); and Hand fracture, left. He has past surgical history that includes Bowel resection; Nevus excision; Colon surgery; and Anterior cervical decomp/discectomy fusion (N/A, 02/12/2013).     ??  Post Discharge Follow Up Issues:   - Continue to follow with Pain Clinic for ongoing chronic pain in [place]  - Continue to follow with [facility] GI     Interval update:  Since hospital discharge, Rodney Bender has been doing better.  He is still having 3 diarrhea stools a day.  He reports nausea and vomiting first thing in the morning and sometimes during the night.  He says his stomach pains are no different than what he has had for years with his Crohn's disease.  He has had sleep issues with difficulty falling asleep and waking up afterwards.  He has had this for a long time however it has been worse recently he reported having some anxiety at night worrying about things and sometimes some shortness of breath that sounds somewhat like a panic attack.  He has been taking been left vaccine for anxiety and depression.  We discussed that this has 30% nausea associated with it and 18% insomnia.  He was agreeable to trying a different medicine for anxiety.  He reports that he has previously tried trazodone and this made him jittery.  He did not have any sleepiness with hydroxyzine.  And did not find Zoloft helpful.  He is open to the idea of seeing a psychiatrist.    MEDICATIONS AND ALLERGIES:   Reviewed and updated in EPIC    Medication and allergy review was completed by the pharmacist and discussed during this visit. Please see their note within this encounter.    SOCIAL/FAMILY HISTORY:  Social History:  Reviewed in Epic    Biopsychosocial barriers to care and advanced directives were addressed by the Child psychotherapist and discussed during the visit. Please see their note within this encounter.    REVIEW  OF SYSTEMS:    All other systems reviewed are negative except as noted here or in HPI.    PHYSICAL EXAM  Vitals:    Vitals:    12/30/17 1315   BP: 154/97   Pulse: 81   Temp: 36.7 ??C (98 ??F)   SpO2: 99%       Exam:  Alert and conversant  Affect is anxious  Lungs CTA   Heart RRR  Abd + BS, soft, NT  Labs:  Reviewed from discharge.  See Epic labs.  Radiology: Reviewed radiology studies from discharge. See in Epic.       MEDICAL/SURGICAL HISTORY:  Patient Active Problem List   Diagnosis   ??? Tobacco use disorder   ??? Intestinal bypass or anastomosis status   ??? Mixed, or nondependent drug abuse, in remission (CMS-HCC)   ??? Abdominal pain, chronic, epigastric   ??? Nonadherence to medical treatment   ??? Crohn's disease of both small and large intestine with intestinal obstruction (CMS-HCC)   ??? Abdominal pain, chronic, right lower quadrant   ??? Clostridium difficile enteritis

## 2017-12-30 NOTE — Unmapped (Signed)
Patient Education        Rotator Cuff: Exercises  Introduction  Here are some examples of exercises for you to try. The exercises may be suggested for a condition or for rehabilitation. Start each exercise slowly. Ease off the exercises if you start to have pain.  You will be told when to start these exercises and which ones will work best for you.  How to do the exercises  Pendulum swing    1. Hold on to a table or the back of a chair with your good arm. Then bend forward a little and let your sore arm hang straight down. This exercise does not use the arm muscles. Rather, use your legs and your hips to create movement that makes your arm swing freely.  2. Use the movement from your hips and legs to guide the slightly swinging arm back and forth like a pendulum (or elephant trunk). Then guide it in circles that start small (about the size of a dinner plate). Make the circles a bit larger each day, as your pain allows.  3. Do this exercise for 5 minutes, 5 to 7 times each day.  4. As you have less pain, try bending over a little farther to do this exercise. This will increase the amount of movement at your shoulder.    Posterior stretching exercise    1. Hold the elbow of your injured arm with your other hand.  2. Use your hand to pull your injured arm gently up and across your body. You will feel a gentle stretch across the back of your injured shoulder.  3. Hold for at least 15 to 30 seconds. Then slowly lower your arm.  4. Repeat 2 to 4 times.    Up-the-back stretch    1. Put your hand in your back pocket. Let it rest there to stretch your shoulder.  2. With your other hand, hold your injured arm (palm outward) behind your back by the wrist. Pull your arm up gently to stretch your shoulder.  3. Next, put a towel over your other shoulder. Put the hand of your injured arm behind your back. Now hold the back end of the towel. With the other hand, hold the front end of the towel in front of your body. Pull gently on the front end of the towel. This will bring your hand farther up your back to stretch your shoulder.    Overhead stretch    1. Standing about an arm's length away, grasp onto a solid surface. You could use a countertop, a doorknob, or the back of a sturdy chair.  2. With your knees slightly bent, bend forward with your arms straight. Lower your upper body, and let your shoulders stretch.  3. As your shoulders are able to stretch farther, you may need to take a step or two backward.  4. Hold for at least 15 to 30 seconds. Then stand up and relax. If you had stepped back during your stretch, step forward so you can keep your hands on the solid surface.  5. Repeat 2 to 4 times.    Shoulder flexion (lying down)    1. Lie on your back, holding a wand with both hands. Your palms should face down as you hold the wand.  2. Keeping your elbows straight, slowly raise your arms over your head. Raise them until you feel a stretch in your shoulders, upper back, and chest.  3. Hold for 15 to 30 seconds.  4. Repeat 2 to 4 times.    Shoulder rotation (lying down)    1. Lie on your back. Hold a wand with both hands with your elbows bent and palms up.  2. Keep your elbows close to your body, and move the wand across your body toward the sore arm.  3. Hold for 8 to 12 seconds.  4. Repeat 2 to 4 times.    Wall climbing (to the side)    1. Stand with your side to a wall so that your fingers can just touch it at an angle about 30 degrees toward the front of your body.  2. Walk the fingers of your injured arm up the wall as high as pain permits. Try not to shrug your shoulder up toward your ear as you move your arm up.  3. Hold that position for a count of at least 15 to 20.  4. Walk your fingers back down to the starting position.  5. Repeat at least 2 to 4 times. Try to reach higher each time.    Wall climbing (to the front)    1. Face a wall, and stand so your fingers can just touch it.  2. Keeping your shoulder down, walk the fingers of your injured arm up the wall as high as pain permits. (Don't shrug your shoulder up toward your ear.)  3. Hold your arm in that position for at least 15 to 30 seconds.  4. Slowly walk your fingers back down to where you started.  5. Repeat at least 2 to 4 times. Try to reach higher each time.    Shoulder blade squeeze    1. Stand with your arms at your sides, and squeeze your shoulder blades together. Do not raise your shoulders up as you squeeze.  2. Hold 6 seconds.  3. Repeat 8 to 12 times.    Scapular exercise: Arm reach    1. Lie flat on your back. This exercise is a very slight motion that starts with your arms raised (elbows straight, arms straight).  2. From this position, reach higher toward the sky or ceiling. Keep your elbows straight. All motion should be from your shoulder blade only.  3. Relax your arms back to where you started.  4. Repeat 8 to 12 times.    Arm raise to the side    1. Slowly raise your injured arm to the side, with your thumb facing up. Raise your arm 60 degrees at the most (shoulder level is 90 degrees).  2. Hold the position for 3 to 5 seconds. Then lower your arm back to your side. If you need to, bring your good arm across your body and place it under the elbow as you lower your injured arm. Use your good arm to keep your injured arm from dropping down too fast.  3. Repeat 8 to 12 times.  4. When you first start out, don't hold any extra weight in your hand. As you get stronger, you may use a 1-pound to 2-pound dumbbell or a small can of food.    Shoulder flexor and extensor exercise    1. Push forward (flex): Stand facing a wall or doorjamb, about 6 inches or less back. Hold your injured arm against your body. Make a closed fist with your thumb on top. Then gently push your hand forward into the wall with about 25% to 50% of your strength. Don't let your body move backward as you push. Hold for about  6 seconds. Relax for a few seconds. Repeat 8 to 12 times.  2. Push backward (extend): Stand with your back flat against a wall. Your upper arm should be against the wall, with your elbow bent 90 degrees (your hand straight ahead). Push your elbow gently back against the wall with about 25% to 50% of your strength. Don't let your body move forward as you push. Hold for about 6 seconds. Relax for a few seconds. Repeat 8 to 12 times.    Scapular exercise: Wall push-ups    1. Stand facing a wall, about 12 inches to 18 inches away.  2. Place your hands on the wall at shoulder height.  3. Slowly bend your elbows and bring your face to the wall. Keep your back and hips straight.  4. Push back to where you started.  5. Repeat 8 to 12 times.  6. When you can do this exercise against a wall comfortably, you can try it against a counter. You can then slowly progress to the end of a couch, then to a sturdy chair, and finally to the floor.    Scapular exercise: Retraction    1. Put the band around a solid object at about waist level. (A bedpost will work well.) Each hand should hold an end of the band.  2. With your elbows at your sides and bent to 90 degrees, pull the band back. Your shoulder blades should move toward each other. Then move your arms back where you started.  3. Repeat 8 to 12 times.  4. If you have good range of motion in your shoulders, try this exercise with your arms lifted out to the sides. Keep your elbows at a 90-degree angle. Raise the elastic band up to about shoulder level. Pull the band back to move your shoulder blades toward each other. Then move your arms back where you started.    Internal rotator strengthening exercise    1. Start by tying a piece of elastic exercise material to a doorknob. You can use surgical tubing or Thera-Band.  2. Stand or sit with your shoulder relaxed and your elbow bent 90 degrees. Your upper arm should rest comfortably against your side. Squeeze a rolled towel between your elbow and your body for comfort. This will help keep your arm at your side.  3. Hold one end of the elastic band in the hand of the painful arm.  4. Slowly rotate your forearm toward your body until it touches your belly. Slowly move it back to where you started.  5. Keep your elbow and upper arm firmly tucked against the towel roll or at your side.  6. Repeat 8 to 12 times.    External rotator strengthening exercise    1. Start by tying a piece of elastic exercise material to a doorknob. You can use surgical tubing or Thera-Band. (You may also hold one end of the band in each hand.)  2. Stand or sit with your shoulder relaxed and your elbow bent 90 degrees. Your upper arm should rest comfortably against your side. Squeeze a rolled towel between your elbow and your body for comfort. This will help keep your arm at your side.  3. Hold one end of the elastic band with the hand of the painful arm.  4. Start with your forearm across your belly. Slowly rotate the forearm out away from your body. Keep your elbow and upper arm tucked against the towel roll or the side of your body  until you begin to feel tightness in your shoulder. Slowly move your arm back to where you started.  5. Repeat 8 to 12 times.    Follow-up care is a key part of your treatment and safety. Be sure to make and go to all appointments, and call your doctor if you are having problems. It's also a good idea to know your test results and keep a list of the medicines you take.  Where can you learn more?  Go to [facility] at [email or web address]  [facility] under the Resources menu. Enter J005 in the search box to learn more about Rotator Cuff: Exercises.  Current as of: July 23, 2017  Content Version: 12.2  ?? 2006-2019 [employer]. Care instructions adapted under license by [facility]. If you have questions about a medical condition or this instruction, always ask your healthcare professional. [employer] disclaims any warranty or liability for your use of this information.

## 2018-01-01 ENCOUNTER — Ambulatory Visit: Admit: 2018-01-01 | Discharge: 2018-01-02 | Payer: MEDICARE

## 2018-01-01 DIAGNOSIS — G894 Chronic pain syndrome: Secondary | ICD-10-CM

## 2018-01-01 DIAGNOSIS — M7581 Other shoulder lesions, right shoulder: Principal | ICD-10-CM

## 2018-01-01 DIAGNOSIS — M7501 Adhesive capsulitis of right shoulder: Secondary | ICD-10-CM

## 2018-01-01 NOTE — Unmapped (Addendum)
ORTHOPAEDIC  NEW CLINIC NOTE  Date: 01/01/2018   Attending: Esmeralda Links, MD  [facility]    Primary Care Physician: Ivery Quale, MD  Self referral new patient.    ASSESSMENT:  Rodney Bender is a 47 y.o. male with the following visit diagnoses:    ICD-10-CM   1. Rotator cuff tendinitis, right M75.81   2. 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 Chronic pain syndrome G89.4   Orthopaedic notes: No specialty comments available.    PLAN:  We discussed Ms. Parady's diagnosis at length today.  We explained that there is no surgical indication based on his MRI or exam. However, he was very frustrated and adamant that we find out exactly what is going on in the shoulder.   We discussed with him he did have some options:  1. We could repeat the MRI in order to make sure that nothing new had happened in his shoulder   2. We could repeat injections, this time having glenohumeral and sub-acromial combined along with formal physical therapy.   The patient was not interested in pursuing those options at this time.                  Scheduling Notes:  Return if symptoms worsen or fail to improve.  - Xrays next visit: none    SUBJECTIVE:  Chief complaint: Right shoulder pain    History of Present Illness:   (>4: location, quality, severity, timing, duration, context, modifying factors, associated symptoms)  47 y.o. male who presents the office today for evaluation of chronic right shoulder pain.  The pain started atraumatically the and has progressed insidiously.  The pain started as a dull pain in the shoulder has progressed to a sharp pain at rest and is worse with overhead activity.  He states that the pain moves from the shoulder up to the neck and down into the midportion of the arm.  He feels the pain is different from the neck pain that he has had previously and in fact saw a neurosurgeon who advised against a neck pathology.  He has seen 1 of our nonoperative providers before and had an MRI of the shoulder.  He also had an injection that lasted for about a month 3 months ago and has done at home exercise program without any relief.    Laterality: right  0-10 Pain Scale: 9    Review of Systems  .   Marland Kitchen   Medical History He has a past medical history of Anxiety, Crohn's disease (CMS-HCC), GERD (gastroesophageal reflux disease), and Hypertension (10/08/2016).   Surgical History He has a past surgical history that includes Colon surgery; pr upper gi endoscopy,biopsy (N/A, 07/23/2012); pr colonoscopy w/biopsy single/multiple (07/23/2012); pr colonoscopy flx dx w/collj spec when pfrmd (Left, 11/10/2012); pr upper gi endoscopy,diagnosis (N/A, 11/10/2012); pr upper gi endoscopy,diagnosis (N/A, 07/21/2013); pr colonoscopy flx dx w/collj spec when pfrmd (N/A, 07/21/2013); pr upper gi endoscopy,diagnosis (N/A, 07/15/2014); pr colsc flexible w/transendoscopic balloon dilat (N/A, 07/15/2014); pr colonoscopy w/biopsy single/multiple (N/A, 07/15/2014); pr colonoscopy flx dx w/collj spec when pfrmd (N/A, 08/09/2016); 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   Allergies Vicodin [hydrocodone-acetaminophen]; Tramadol; Dilaudid [hydromorphone]; Morphine; and Opioids - morphine analogues   Medications He has a current medication list which includes the following prescription(s): citalopram, dronabinol, empty container, hydroxyzine, naloxone, oxycodone-acetaminophen, ustekinumab, and vancomycin.   Family History His family history includes Cancer in his maternal  aunt; Hyperlipidemia in his father; No Known Problems in his brother, maternal grandfather, maternal grandmother, maternal uncle, mother, paternal aunt, paternal grandfather, paternal grandmother, paternal uncle, and sister.   Social History He reports that he quit smoking about 6 months ago. His smoking use included cigarettes. He started smoking about 14 years ago. He has a 18.00 pack-year smoking history. He has never used smokeless tobacco. He reports current drug use. Drug: Marijuana. He reports that he does not drink alcohol.Home [address]  [place] [address]        Patient does not qualify to have social determinant information on file (likely too young).   Occupational History   ??? Not on file      The history recorded in the table above was reviewed.    OBJECTIVE:  DETAILED PHYSICAL EXAM (12 Point)  General Appearance ?? well-nourished, in no acute distress.   Mood and Affect ?? alert, cooperative and pleasant.   Gait and Station ?? Gait is non-antalgic   Cardiovascular ?? well-perfused distally and no swelling.   Sensation ?? sensation to light touch distally normal    MUSCULOSKELETAL    Right Shoulder  Inspection/palpation  Range of motion  Stability  Strength  Skin Inspection/palpation:  No deformity, diffusely tender about shoulder, pain with O'Briens and Jobes  Range of motion:  FF 120 with pain, ER 70, IR BP   Stability:  No apprehension, neg jerk  Strength:  5/5 supra, infra, subscap, R, U, M  Skin:  intact   Left Shoulder  Inspection/palpation  Range of motion  Stability  Strength  Skin Inspection/palpation:  No deformity, non tender  Range of motion:  FF 180, ER 70, IR TL  Stability: No apprehension  Strength:  5/5 throughout  Skin:  intact     Test Results  Imaging  Prior x-ray reviewed: No abnormalities  Prior MRI from March reviewed demonstrating a tiny insertional supraspinatus tear as well as anterior capsule hypertrophy    No orders of the defined types were placed in this encounter.    Requested Prescriptions      No prescriptions requested or ordered in this encounter        Note created by Tomasa Hose, January 01, 2018 8:50 AM

## 2018-01-01 NOTE — Unmapped (Signed)
I saw and evaluated the patient, participating in the key portions of the service.  I reviewed the resident???s note.  I agree with the resident???s findings and plan. Cruz Condon, MD

## 2018-01-06 MED ORDER — METOCLOPRAMIDE 10 MG TABLET
ORAL_TABLET | Freq: Two times a day (BID) | ORAL | 1 refills | 0 days | Status: CP | PRN
Start: 2018-01-06 — End: 2018-01-13

## 2018-01-06 NOTE — Unmapped (Signed)
Message Title: Transitions of Care Phone Call Follow-Up  Transitions Phone Call Follow-Up: Week # 1 after hospital f/u clinic visit  Person spoken with: pt    Conditions and Symptoms  New problems: Patient wakes up every morning with nausea, nausea medication is not working (sublingual zofran). Reports this has been going on for about a month. Wakes him up from sleep in the morning and he states he vomits every time.     He's taking citalopram and hydroxyzine  12/11 to 12/17 Take 1 capsule two times per day.    Medicine Changes:  ?? Stop venlafaxine (this may be worsening insomnia, nausea, vomiting, and diarrhea)  ?? Start citalopram 20 mg - Take 1 by mouth every day  ?? If hydroxyzine not helpful then would only use at night, or stop completely  ??  Vancomycin Taper Instructions:  ?? 11/27 to 12/10 Take 1 capsule four times per day  ?? 12/11 to 12/17 Take 1 capsule two times per day  ?? 12/18 to 12/24 Take 1 capsule one time per day  ?? 12/25 to 1/21 Take 1 capsule every other day  ??  Follow-up:   ?? Schedule follow-up on the way out with Dr. Glorianne Manchester  ??  Call or Come Back to Clinic if:   If worsening diarrhea, or dehydration    Upcoming appointments: scheduled in [facility] for next week. PCP 02/02/2018.    Provided the patient with the following information:  -  Same Day Clinic:  [phone].  8am-5pm M-F.     Information forwarded to PCP for review.

## 2018-01-13 ENCOUNTER — Ambulatory Visit: Admit: 2018-01-13 | Discharge: 2018-01-14 | Payer: MEDICARE

## 2018-01-13 DIAGNOSIS — G8929 Other chronic pain: Secondary | ICD-10-CM

## 2018-01-13 DIAGNOSIS — R112 Nausea with vomiting, unspecified: Principal | ICD-10-CM

## 2018-01-13 DIAGNOSIS — M25511 Pain in right shoulder: Secondary | ICD-10-CM

## 2018-01-13 DIAGNOSIS — K50818 Crohn's disease of both small and large intestine with other complication: Secondary | ICD-10-CM

## 2018-01-13 DIAGNOSIS — F419 Anxiety disorder, unspecified: Secondary | ICD-10-CM

## 2018-01-13 DIAGNOSIS — M5412 Radiculopathy, cervical region: Secondary | ICD-10-CM

## 2018-01-13 MED ORDER — PROCHLORPERAZINE MALEATE 5 MG TABLET
ORAL_TABLET | Freq: Three times a day (TID) | ORAL | 2 refills | 0.00000 days | Status: CP | PRN
Start: 2018-01-13 — End: 2018-03-06

## 2018-01-13 NOTE — Unmapped (Signed)
[facility] - Same Day Clinic     Assessment & Plan:   Rodney Bender is a 47 y.o. male with the below problem list who presents to the same day clinic for nausea/vomiting, anxiety, and shoulder pain.     Nausea, Vomiting: Reports intractable nausea and vomiting that varies from mucus to undigested food.  Exact etiology is unclear and despite his intractable nausea and vomiting, he appears to be well hydrated and has a gained weight.  I somewhat question the authenticity of his nausea and vomiting, especially given his repeated requests for promethazine.  I will send a message to his gastroenterologist, but I do not think that this is related to his Crohn's disease and he has no history of upper tract Crohn's.  -- Discontinue metoclopramide  -- Compazine 5mg  3 times daily as needed    R Cervical Radiculopathy: History of right neck surgery and he reports that he has a rod on that side.  He is notably weak in the right upper extremity as compared to the left, concerning for cervical origin to his symptoms.  The shoulder has previously been imaged with a small supraspinatus tear and sequela of adhesive capsulitis.  Does not appear to have shoulder pathology today.  Will refer to the spine center for further evaluation by a spine specialist and defer need for further imaging to them.  -- Refer [facility], defer further advanced imaging to their judgment    Anxiety: Chronic, with multiple recent medication changes.  Too soon to make further medication adjustments.  He would like to establish with a psychiatrist.  -- Provided referral for local psychiatrist  -- Citalopram 20mg  daily, consider up titration at next visit  -- Buspirone 10mg  TID (unclear who/when this was started)    Return if symptoms worsen or fail to improve.    Patient was seen and discussed with Dr. Madelon Lips who is in agreement with the assessment and plan as outlined above.     Subjective:   Rodney Bender is a 47 y.o. male with the below problem list who presents to the same day clinic for nausea/vomiting, anxiety, and shoulder pain.  First concern is intractable nausea and vomiting.  He reports daily, persistent nausea with frequent episodes of emesis.  Emesis can vary from just mucus to undigested food.  He reports he is woken up every day by an episode of emesis.  He denies hematemesis.  He is having no changes to his bowel habits, noting that his stools continue to be loose as he is finishing treatment for his C. difficile infection.  He had mild relief with Zofran, no relief with metoclopramide.  He mentioned Phenergan multiple times in our conversation.    Second concern is anxiety.  He reports that he is very anxious at all times.  He was recently switched from venlafaxine to citalopram, reports no difference.  He also appears to been started on buspirone, but he is not sure what started it.  He says that we started buspirone, but there is no documentation in our medical record about that.    Third concern is right-sided neck and shoulder pain.  He reports that this is a chronic issue.  He has had an MRI for this in the past that showed a slight tear in his rotator cuff as well as adhesive capsulitis sequela.  He reports that he wants it reimaged because he said that something else is going on.  He has seen his  orthopedist for this who put in a steroid injection with some relief, and recommended physical therapy but the patient does not want to go to physical therapy until someone can tell he was wrong with it.  He reports weakness on the right side as well as decreased range of motion.  He reports that the pain wakes him up from sleep and describes it as a tingling/burning sensation.  He is previous been on gabapentin with some relief, he gets no relief his current Percocet.    Review of Systems: 10 point ROS was performed and is negative other than as mentioned in the HPI.     Medications and Allergies: Reviewed and updated in Epic    Problem List:  Patient Active Problem List   Diagnosis   ??? Tobacco use disorder   ??? Intestinal bypass or anastomosis status   ??? Mixed, or nondependent drug abuse, in remission (CMS-HCC)   ??? Nonadherence to medical treatment   ??? Crohn's disease of both small and large intestine with other complication (CMS-HCC)   ??? Chronic abdominal pain   ??? Clostridium difficile enteritis       Objective:     Vitals:    01/13/18 1305   BP: 119/86   Pulse: 74   Resp: 16   Temp: 36.8 ??C   SpO2: 97%     General: Well-appearing male in no apparent distress, rifling through clinic drawers  CV: regular, no murmurs  Resp: CTAB, no wheezes or crackles, normal WOB  GI: soft, NTND, NABS  MSK: Full range of motion in the right shoulder, elbow, and wrist.  Bony tenderness over the right acromioclavicular joint, none over the sternoclavicular joint. Tenderness with palpation over the right deltoid muscle.  Skin: clean and dry, no rashes or lesions noted  Ext: no cyanosis/clubbing/edema  Neuro: alert, follows commands.  Strength 5 out of 5 in the left upper extremity with hand grip, forearm flexion/extension, shoulder abduction/abduction.  Strength 4 out of 5 in the right upper extremity with all maneuvers.    Procedure: None  See procedure note from this encounter    Metric Tracker:  Did today's visit result in referral to ED or direct admission? No

## 2018-01-13 NOTE — Unmapped (Addendum)
[facility]  Same Day Clinic  Phone: [phone]    Today's Visit:  -- For your nausea and vomiting, stop taking Reglan and start using prochlorperazine (Compazine) as needed. I recommend taking a dose before bedtime, you may use it up to 3 times per day in total.     -- Resume taking gabapentin 300mg  three times per day. We will send in a referral to the Spine Center for an evaluation.     -- I will send a message to your gastroenterologist about our visit    -- Please do not hesitate to call or send a MyChart message with any questions. If you have any pending test or imaging results, I will either call you or release the results and a message to your MyChart account.      Local Psychiatry Services    These organizations will serve anyone who is having a non-911 emergency. Phone numbers can also be used to locate counseling and psychiatric services for people who have Medicaid or for those with low or no income.     [employer] 24 hour Crisis Line:[phone]  Counties Served: [address], [place], [place], [place], [place], [place], [place], [place], [place], [place], [place], [place], [place], [place], [place], [place], [place], [place], [place] and [place]

## 2018-01-16 NOTE — Unmapped (Signed)
Message Title: Transitions of Care Phone Call Follow-Up  Transitions Phone Call Follow-Up: Week # 2 after hospital f/u clinic visit   Person spoken with: Rodney Bender    Conditions and Symptoms  New problems: nausea/vomitting improved  Medications: Compazine 5mg  3 times daily as needed    Upcoming appointments: 1/6    Comments: hasn't heard from [facility]. [place] encouraged him to call, he has number at home.

## 2018-01-19 NOTE — Unmapped (Signed)
I saw and evaluated the patient, participating in the key portions of the service.  I reviewed the resident???s note.  I agree with the resident???s findings and plan. Javana Schey A Izreal Kock, MD

## 2018-01-20 NOTE — Unmapped (Signed)
[facility] Refill Coordination Note  Specialty Medication(s): Stelara 90mg /ml  Additional Medications shipped:      Rodney Bender, DOB: [birth year]  Phone: [phone] (home) , Alternate phone contact: N/A  Phone or address changes today?: No  All above HIPAA information was verified with patient.  Shipping Address: [address]  [place] [address]   Insurance changes? No    Completed refill call assessment today to schedule patient's medication shipment from the [facility] [phone]).      Confirmed the medication and dosage are correct and have not changed: Yes, regimen is correct and unchanged.    Confirmed patient started or stopped the following medications in the past month:  No, there are no changes reported at this time.    Are you tolerating your medication?:  Rodney Bender reports tolerating the medication.    ADHERENCE    (Below is required for Medicare Part B or Transplant patients only - per drug):   How many tablets were dispensed last month:  Patient currently has 0 remaining.    Did you miss any doses in the past 4 weeks? No missed doses reported.    FINANCIAL/SHIPPING    Delivery Scheduled: Yes, Expected medication delivery date: [id number]     Medication will be delivered via Same Day Courier to the home address in [facility].    The patient will receive a drug information handout for each medication shipped and additional FDA Medication Guides as required.      Rodney Bender did not have any additional questions at this time.    We will follow up with patient monthly for standard refill processing and delivery.      Thank you,  Antonietta Barcelona   [facility] Specialty Technician

## 2018-01-27 MED FILL — STELARA 90 MG/ML SUBCUTANEOUS SYRINGE: SUBCUTANEOUS | 56 days supply | Qty: 1 | Fill #2

## 2018-01-27 MED FILL — STELARA 90 MG/ML SUBCUTANEOUS SYRINGE: 56 days supply | Qty: 1 | Fill #2 | Status: AC

## 2018-02-03 MED ORDER — DRONABINOL 10 MG CAPSULE
0 refills | 0 days | Status: CP
Start: 2018-02-03 — End: 2018-03-02

## 2018-02-17 ENCOUNTER — Other Ambulatory Visit: Payer: Self-pay

## 2018-02-17 ENCOUNTER — Ambulatory Visit: Payer: Medicare Other | Attending: Nurse Practitioner | Admitting: Nurse Practitioner

## 2018-02-17 VITALS — BP 171/99 | HR 74 | Temp 98.0°F | Resp 16 | Ht 71.0 in | Wt 145.0 lb

## 2018-02-17 DIAGNOSIS — R109 Unspecified abdominal pain: Secondary | ICD-10-CM | POA: Diagnosis present

## 2018-02-17 DIAGNOSIS — G894 Chronic pain syndrome: Secondary | ICD-10-CM

## 2018-02-17 DIAGNOSIS — G8929 Other chronic pain: Secondary | ICD-10-CM | POA: Insufficient documentation

## 2018-02-17 DIAGNOSIS — M5020 Other cervical disc displacement, unspecified cervical region: Secondary | ICD-10-CM | POA: Diagnosis present

## 2018-02-17 DIAGNOSIS — K5090 Crohn's disease, unspecified, without complications: Secondary | ICD-10-CM | POA: Diagnosis present

## 2018-02-17 MED ORDER — OXYCODONE-ACETAMINOPHEN 10-325 MG PO TABS
1.0000 | ORAL_TABLET | Freq: Three times a day (TID) | ORAL | 0 refills | Status: DC | PRN
Start: 2018-03-19 — End: 2018-03-19

## 2018-02-17 MED ORDER — OXYCODONE-ACETAMINOPHEN 10-325 MG PO TABS: 1.0000 | ORAL_TABLET | Freq: Three times a day (TID) | ORAL | 0 refills | Status: DC

## 2018-02-17 NOTE — Progress Notes (Signed)
Patient's Name: Benjamin Murillo  MRN: [id number]  Referring Provider: Gennette Pac, FNP  DOB: March 16, 1970  PCP: Gennette Pac, FNP  DOS: 02/17/2018  Note by: Vevelyn Francois NP  Service setting: Ambulatory outpatient  Specialty: Interventional Pain Management  Location: [facility] (AMB) Pain Management Facility    Patient type: Established    Primary Reason(s) for Visit: Encounter for prescription drug management. (Level of risk: moderate)  CC: Abdominal Pain (mid) and Neck Pain (right)  HPI  Benjamin Murillo is a 48 y.o. year old, male patient, who comes today for a medication management evaluation. He has Crohn's ileitis ([place]); Chronic abdominal pain; Acute abdominal pain; Tobacco abuse; Nonadherence to medical treatment; HNP (herniated nucleus pulposus), cervical; Crohn's colitis ([place]); AP (abdominal pain); Crohn disease ([place]); Diarrhea; Disorder of intestine; Disorder of stomach and duodenum; Drug dependence ([place]); Duodenal ulcer; Status post intestinal bypass or anastomosis; Mixed, or nondependent drug abuse, in remission ([place]); Nausea with vomiting; Personal history of digestive disease; S/P excision of lipoma; Chronic pain syndrome; Long term current use of opiate analgesic; Clostridium difficile enteritis; and Crohn's disease of both small and large intestine with other complication ([place]) on their problem list. His primarily concern today is the Abdominal Pain (mid) and Neck Pain (right)  Pain Assessment: Location:   Abdomen Radiating: abd stays, pain in neck goes down to right shoulder10/10 Onset: More than a month ago Duration: Chronic pain Quality: Aching, Discomfort Severity:  7/10 (subjective, self-reported pain score)  Note: Reported level is compatible with observation. Clinically the patient looks like a 0/10       Benjamin Murillo does not seem to understand the use of our objective pain scale When using our objective Pain Scale, levels between 6 and 10/10 are said to belong in an emergency  room, as it progressively worsens from a 6/10, described as severely limiting, requiring emergency care not usually available at an outpatient pain management facility. At a 6/10 level, communication becomes difficult and requires great effort. Assistance to reach the emergency department may be required. Facial flushing and profuse sweating along with potentially dangerous increases in heart rate and blood pressure will be evident. Effect on ADL: hard to lift arm , hold Timing: Constant Modifying factors: Having MRI 02/27/2018 BP: (!) 171/99  HR: 74  Benjamin Murillo was last scheduled for an appointment on Visit date not found for medication management. During today's appointment we reviewed Benjamin Murillo chronic pain status, as well as his outpatient medication regimen.  He is 8 months tobacco free and admits that he is feeling better than he has felt in a long time.  He is walking more.  The patient  reports previous drug use. Drug: Marijuana. His body mass index is 20.22 kg/m.  Further details on both, my assessment(s), as well as the proposed treatment plan, please see below.  Controlled Substance Pharmacotherapy Assessment REMS (Risk Evaluation and Mitigation Strategy)  Analgesic: Oxycodone/acetaminophen 10/325 mg up to 3 times daily MME/day: 45 mg/day.  Ignatius Specking, RN  02/17/2018  9:39 AM  Sign when Signing Visit Nursing Pain Medication Assessment:  Safety precautions to be maintained throughout the outpatient stay will include: orient to surroundings, keep bed in low position, maintain call bell within reach at all times, provide assistance with transfer out of bed and ambulation.  Medication Inspection Compliance: Pill count conducted under aseptic conditions, in front of the patient. Neither the pills nor the bottle was removed from the patient's sight at any time. Once count was completed  pills were immediately returned to the patient in their original bottle.  Medication:  Oxycodone/APAP Pill/Patch Count: 0 of 75 pills remain Pill/Patch Appearance: Markings consistent with prescribed medication Bottle Appearance: Standard pharmacy container. Clearly labeled. Filled Date: 52 / 22 / 2019 Last Medication intake:  Yesterday   Pharmacokinetics: Liberation and absorption (onset of action): WNL Distribution (time to peak effect): WNL Metabolism and excretion (duration of action): WNL         Pharmacodynamics: Desired effects: Analgesia: Benjamin Murillo reports >50% benefit. Functional ability: Patient reports that medication allows him to accomplish basic ADLs Clinically meaningful improvement in function (CMIF): Sustained CMIF goals met Perceived effectiveness: Described as relatively effective, allowing for increase in activities of daily living (ADL) Undesirable effects: Side-effects or Adverse reactions: None reported Monitoring: [place] PMP: Online review of the past 41-monthperiod conducted. Compliant with practice rules and regulations Last UDS on record: Summary  Date Value Ref Range Status  10/20/2017 FINAL  Final    Comment:    ==================================================================== TOXASSURE SELECT 13 (MW) ==================================================================== Test                             Result       Flag       Units Drug Present and Declared for Prescription Verification   Carboxy-THC                    >257         EXPECTED   ng/mg creat    Carboxy-THC is a metabolite of tetrahydrocannabinol  (THC).    [facility] most commonly illicit, but THC is also present    in a scheduled prescription medication.   Oxycodone                      515          EXPECTED   ng/mg creat   Oxymorphone                    416          EXPECTED   ng/mg creat   Noroxycodone                   1173         EXPECTED   ng/mg creat   Noroxymorphone                 225          EXPECTED   ng/mg creat    Sources of oxycodone are scheduled  prescription medications.    Oxymorphone, noroxycodone, and noroxymorphone are expected    metabolites of oxycodone. Oxymorphone is also available as a    scheduled prescription medication. ==================================================================== Test                      Result    Flag   Units      Ref Range   Creatinine              389              mg/dL      >=20 ==================================================================== Declared Medications:  The flagging and interpretation on this report are based on the  following declared medications.  Unexpected results may arise from  inaccuracies in the declared medications.  **Note: The testing scope of this panel includes these medications:  Dronabinol (Marinol)  Oxycodone (Percocet)  **Note: The testing scope of this panel does not include following  reported medications:  Acetaminophen (Percocet)  Amlodipine (Caduet)  Atorvastatin (Caduet)  Eye Drops  Famotidine (Pepcid)  Gabapentin (Neurontin)  Hyoscyamine (Levsin)  Iron  Mouthwash  Naloxone (Narcan) ==================================================================== For clinical consultation, please call [phone]. ====================================================================    UDS interpretation: Unexpected findings not considered significantly abnormal In this case,presence of THC is due to Dronabinol/Marinol use for other chronic condition. Medication Assessment Form: Reviewed. Patient indicates being compliant with therapy Treatment compliance: Compliant Risk Assessment Profile: Aberrant behavior: See prior evaluations. None observed or detected today Comorbid factors increasing risk of overdose: See prior notes. No additional risks detected today Opioid risk tool (ORT) (Total Score): 5 Personal History of Substance Abuse (SUD-Substance use disorder):  Alcohol: Negative  Illegal Drugs: Positive Male or Male  Rx Drugs: Negative  ORT  Risk Level calculation: Moderate Risk Risk of substance use disorder (SUD): Moderate-to-High Opioid Risk Tool - 02/17/18 0936      Family History of Substance Abuse   Alcohol  Negative    Illegal Drugs  Negative    Rx Drugs  Negative      Personal History of Substance Abuse   Alcohol  Negative    Illegal Drugs  Positive Male or Male    Rx Drugs  Negative      Age   Age between 70-45 years   Yes      Psychological Disease   Psychological Disease  Negative    Depression  Negative      Total Score   Opioid Risk Tool Scoring  5    Opioid Risk Interpretation  Moderate Risk      ORT Scoring interpretation table:  Score <3 = Low Risk for SUD  Score between 4-7 = Moderate Risk for SUD  Score >8 = High Risk for Opioid Abuse   Risk Mitigation Strategies:  Patient Counseling: Covered Patient-Prescriber Agreement (PPA): Present and active  Notification to other healthcare providers: Done  Pharmacologic Plan: No change in therapy, at this time.             Laboratory Chemistry  Inflammation Markers (CRP: Acute Phase) (ESR: Chronic Phase) Lab Results  Component Value Date   CRP 0.5 10/11/2015   ESRSEDRATE 13 10/12/2015   LATICACIDVEN 0.80 07/05/2013                         Rheumatology Markers No results found for: RF, ANA, LABURIC, URICUR, LYMEIGGIGMAB, LYMEABIGMQN, HLAB27                      Renal Function Markers Lab Results  Component Value Date   BUN 11 10/13/2015   CREATININE 0.94 10/13/2015   GFRAA >60 10/13/2015   GFRNONAA >60 10/13/2015                             Hepatic Function Markers Lab Results  Component Value Date   AST 21 10/11/2015   ALT 15 (L) 10/11/2015   ALBUMIN 4.8 10/11/2015   ALKPHOS 84 10/11/2015   LIPASE 36 10/11/2015                        Electrolytes Lab Results  Component Value Date   NA 133 (L) 10/13/2015   K 4.0 10/13/2015   CL 104 10/13/2015  CALCIUM 8.3 (L) 10/13/2015   MG 1.7 09/02/2007                         Neuropathy Markers No results found for: VITAMINB12, FOLATE, HGBA1C, HIV                      CNS Tests No results found for: COLORCSF, APPEARCSF, RBCCOUNTCSF, WBCCSF, POLYSCSF, LYMPHSCSF, EOSCSF, PROTEINCSF, GLUCCSF, JCVIRUS, CSFOLI, IGGCSF                      Bone Pathology Markers No results found for: VD25OH, [id number], KG8185UD1, SH7026VZ8, 25OHVITD1, 25OHVITD2, 25OHVITD3, TESTOFREE, TESTOSTERONE                       Coagulation Parameters Lab Results  Component Value Date   PLT 226 10/13/2015                        Cardiovascular Markers Lab Results  Component Value Date   TROPONINI <0.03 08/07/2015   HGB 12.0 (L) 10/13/2015   HCT 34.1 (L) 10/13/2015                         CA Markers No results found for: CEA, CA125, LABCA2                      Note: Lab results reviewed.  Recent Diagnostic Imaging Results  DG Chest 2 View CLINICAL DATA:  Chest pain, shortness of breath, nausea  EXAM: CHEST  2 VIEW  COMPARISON:  07/05/2013  FINDINGS: Cardiomediastinal silhouette is stable. No acute infiltrate or pleural effusion. No pulmonary edema. Bony thorax is unremarkable. Metallic fixation plate noted cervical spine.  IMPRESSION: No active cardiopulmonary disease.  Electronically Signed   By: Lahoma Crocker M.D.   On: 08/07/2015 11:42  Complexity Note: Imaging results reviewed. Results shared with Benjamin Murillo, using Layman's terms.                         Meds   Current Outpatient Medications:  .  amlodipine-atorvastatin (CADUET) 10-10 MG tablet, Take 1 tablet by mouth daily., Disp: , Rfl:  .  dronabinol (MARINOL) 10 MG capsule, Take 10 mg by mouth 2 (two) times daily before a meal., Disp: , Rfl:  .  famotidine (PEPCID) 20 MG tablet, Take 20 mg by mouth 2 (two) times daily., Disp: , Rfl:  .  gentamicin (GARAMYCIN) 0.3 % ophthalmic solution, Place 1 drop into the right eye every 4 (four) hours., Disp: 5 mL, Rfl: 0 .  hyoscyamine (LEVSIN, ANASPAZ) 0.125 MG  tablet, Take 1 tablet by mouth as needed., Disp: , Rfl:  .  IRON PO, Take by mouth., Disp: , Rfl:  .  magic mouthwash w/lidocaine SOLN, Take 5 mLs by mouth 4 (four) times daily., Disp: 100 mL, Rfl: 0 .  naloxone (NARCAN) nasal spray 4 mg/0.1 mL, For respiratory depression from opioids, Disp: 1 kit, Rfl: 2 .  oxyCODONE-acetaminophen (PERCOCET) 10-325 MG tablet, Take 1 tablet by mouth 3 (three) times daily for 30 days., Disp: 75 tablet, Rfl: 0 .  gabapentin (NEURONTIN) 100 MG capsule, Take 1 capsule (100 mg total) by mouth at bedtime., Disp: 30 capsule, Rfl: 1 .  [START ON 03/19/2018] oxyCODONE-acetaminophen (PERCOCET) 10-325 MG tablet, Take 1 tablet by mouth 3 (three) times daily as  needed for up to 30 days for pain., Disp: 75 tablet, Rfl: 0  ROS  Constitutional: Denies any fever or chills Gastrointestinal: No reported hemesis, hematochezia, vomiting, or acute GI distress Musculoskeletal: Denies any acute onset joint swelling, redness, loss of ROM, or weakness Neurological: No reported episodes of acute onset apraxia, aphasia, dysarthria, agnosia, amnesia, paralysis, loss of coordination, or loss of consciousness  Allergies  Benjamin Murillo is allergic to vicodin [hydrocodone-acetaminophen]; tramadol; and morphine and related.  [place]  Drug: Benjamin Murillo  reports previous drug use. Drug: Marijuana. Alcohol:  reports no history of alcohol use. Tobacco:  reports that he has quit smoking. His smoking use included cigarettes. He has a 2.20 pack-year smoking history. He has never used smokeless tobacco. Medical:  has a past medical history of Cervical radiculopathy, Chronic abdominal pain, Chronic neck pain, Cold, Crohn disease ([place]), GERD (gastroesophageal reflux disease), Hand fracture, left, Headache(784.0), Hypertension, IBS (irritable bowel syndrome), and Shortness of breath. Surgical: Benjamin Murillo  has a past surgical history that includes Bowel resection; Nevus excision; Anterior cervical  decomp/discectomy fusion (N/A, 02/12/2013); Colonoscopy; Colonoscopy with propofol (N/A, 11/13/2015); Esophagogastroduodenoscopy (egd) with propofol (N/A, 11/13/2015); Colon surgery; and Lipoma excision (Right, 01/24/2016). Family: family history includes Cancer in his maternal aunt; Diabetes in his paternal aunt; Hyperlipidemia in his mother.  Constitutional Exam  General appearance: Well nourished, well developed, and well hydrated. In no apparent acute distress Vitals:   02/17/18 0927  BP: (!) 171/99  Pulse: 74  Resp: 16  Temp: 98 F (36.7 C)  SpO2: 99%  Weight: 145 lb (65.8 kg)  Height: '5\' 11"'$  (1.803 m)   BMI Assessment: Estimated body mass index is 20.22 kg/m as calculated from the following:   Height as of this encounter: '5\' 11"'$  (1.803 m).   Weight as of this encounter: 145 lb (65.8 kg).  BMI interpretation table: BMI level Category Range association with higher incidence of chronic pain  <18 kg/m2 Underweight   18.5-24.9 kg/m2 Ideal body weight   25-29.9 kg/m2 Overweight Increased incidence by 20%  30-34.9 kg/m2 Obese (Class I) Increased incidence by 68%  35-39.9 kg/m2 Severe obesity (Class II) Increased incidence by 136%  >40 kg/m2 Extreme obesity (Class III) Increased incidence by 254%   Patient's current BMI Ideal Body weight  Body mass index is 20.22 kg/m. Ideal body weight: 75.3 kg (166 lb 0.1 oz)   BMI Readings from Last 4 Encounters:  02/17/18 20.22 kg/m  12/18/17 18.13 kg/m  10/20/17 18.83 kg/m  08/19/17 18.83 kg/m   Wt Readings from Last 4 Encounters:  02/17/18 145 lb (65.8 kg)  12/18/17 130 lb (59 kg)  10/20/17 135 lb (61.2 kg)  08/19/17 135 lb (61.2 kg)  Psych/Mental status: Alert, oriented x 3 (person, place, & time)       Eyes: PERLA Respiratory: No evidence of acute respiratory distress  Cervical Spine Area Exam  Skin & Axial Inspection: No masses, redness, edema, swelling, or associated skin lesions Alignment: Symmetrical Functional ROM:  Unrestricted ROM      Stability: No instability detected Muscle Tone/Strength: Functionally intact. No obvious neuro-muscular anomalies detected. Sensory (Neurological): Unimpaired Palpation: No palpable anomalies              Upper Extremity (UE) Exam    Side: Right upper extremity  Side: Left upper extremity  Skin & Extremity Inspection: Skin color, temperature, and hair growth are WNL. No peripheral edema or cyanosis. No masses, redness, swelling, asymmetry, or associated skin lesions. No contractures.  Skin &  Extremity Inspection: Skin color, temperature, and hair growth are WNL. No peripheral edema or cyanosis. No masses, redness, swelling, asymmetry, or associated skin lesions. No contractures.  Functional ROM: Unrestricted ROM          Functional ROM: Unrestricted ROM          Muscle Tone/Strength: Functionally intact. No obvious neuro-muscular anomalies detected.  Muscle Tone/Strength: Functionally intact. No obvious neuro-muscular anomalies detected.  Sensory (Neurological): Unimpaired          Sensory (Neurological): Unimpaired          Palpation: No palpable anomalies              Palpation: No palpable anomalies               Thoracic Spine Area Exam  Skin & Axial Inspection: No masses, redness, or swelling Alignment: Symmetrical Functional ROM: Unrestricted ROM Stability: No instability detected Muscle Tone/Strength: Functionally intact. No obvious neuro-muscular anomalies detected. Sensory (Neurological): Unimpaired Muscle strength & Tone: No palpable anomalies  Lumbar Spine Area Exam  Skin & Axial Inspection: No masses, redness, or swelling Alignment: Symmetrical Functional ROM: Unrestricted ROM       Stability: No instability detected Muscle Tone/Strength: Functionally intact. No obvious neuro-muscular anomalies detected. Sensory (Neurological): Unimpaired Palpation: No palpable anomalies         Gait & Posture Assessment  Ambulation: Unassisted Gait: Relatively  normal for age and body habitus Posture: WNL   Lower Extremity Exam    Side: Right lower extremity  Side: Left lower extremity  Stability: No instability observed          Stability: No instability observed          Skin & Extremity Inspection: Skin color, temperature, and hair growth are WNL. No peripheral edema or cyanosis. No masses, redness, swelling, asymmetry, or associated skin lesions. No contractures.  Skin & Extremity Inspection: Skin color, temperature, and hair growth are WNL. No peripheral edema or cyanosis. No masses, redness, swelling, asymmetry, or associated skin lesions. No contractures.  Functional ROM: Unrestricted ROM                  Functional ROM: Unrestricted ROM                  Muscle Tone/Strength: Functionally intact. No obvious neuro-muscular anomalies detected.  Muscle Tone/Strength: Functionally intact. No obvious neuro-muscular anomalies detected.  Sensory (Neurological): Unimpaired        Sensory (Neurological): Unimpaired         Assessment  Primary Diagnosis & Pertinent Problem List: The primary encounter diagnosis was Chronic abdominal pain. Diagnoses of HNP (herniated nucleus pulposus), cervical, Crohn's disease without complication, unspecified gastrointestinal tract location [facility]), and Chronic pain syndrome were also pertinent to this visit.  Status Diagnosis  Controlled Controlled Controlled 1. Chronic abdominal pain   2. HNP (herniated nucleus pulposus), cervical   3. Crohn's disease without complication, unspecified gastrointestinal tract location ([place])   4. Chronic pain syndrome     Problems updated and reviewed during this visit: Problem  Chronic Abdominal Pain  Nonadherence to Medical Treatment  Clostridium Difficile Enteritis  Crohn's Disease of Both Small and Large Intestine With Other Complication ([facility])   Plan of Care  Pharmacotherapy (Medications Ordered): Meds ordered this encounter  Medications  . oxyCODONE-acetaminophen  (PERCOCET) 10-325 MG tablet    Sig: Take 1 tablet by mouth 3 (three) times daily for 30 days.    Dispense:  75 tablet    Refill:  0    Do not place this medication, or any other prescription from our practice, on "Automatic Refill". Patient may have prescription filled one day early if pharmacy is closed on scheduled refill date.    Order Specific Question:   Supervising Provider    Answer:   Milinda Pointer [phone]  . oxyCODONE-acetaminophen (PERCOCET) 10-325 MG tablet    Sig: Take 1 tablet by mouth 3 (three) times daily as needed for up to 30 days for pain.    Dispense:  75 tablet    Refill:  0    Do not place this medication, or any other prescription from our practice, on "Automatic Refill". Patient may have prescription filled one day early if pharmacy is closed on scheduled refill date.    Order Specific Question:   Supervising Provider    Answer:   Milinda Pointer [[id number]]   New Prescriptions   No medications on file   Medications administered today: Grace Bushy. [place] had no medications administered during this visit. Lab-work, procedure(s), and/or referral(s): No orders of the defined types were placed in this encounter.  Imaging and/or referral(s): None  Interventional therapies: Planned, scheduled, and/or pending:   Not at this time.   Provider-requested follow-up: Return in about 2 months (around 04/18/2018) for MedMgmt.  Future Appointments  Date Time Provider [place]  04/15/2018  2:30 PM Vevelyn Francois, NP [facility] None   Primary Care Physician: Gennette Pac, FNP Location: [facility] Outpatient Pain Management Facility Note by: Vevelyn Francois NP Date: 02/17/2018; Time: 10:42 AM  Pain Score Disclaimer: We use the NRS-11 scale. This is a self-reported, subjective measurement of pain severity with only modest accuracy. It is used primarily to identify changes within a particular patient. It must be understood that outpatient pain scales are  significantly less accurate that those used for research, where they can be applied under ideal controlled circumstances with minimal exposure to variables. In reality, the score is likely to be a combination of pain intensity and pain affect, where pain affect describes the degree of emotional arousal or changes in action readiness caused by the sensory experience of pain. Factors such as social and work situation, setting, emotional state, anxiety levels, expectation, and prior pain experience may influence pain perception and show large inter-individual differences that may also be affected by time variables.  Patient instructions provided during this appointment: Patient Instructions  ____________________________________________________________________________________________  Medication Rules  Purpose: To inform patients, and their family members, of our rules and regulations.  Applies to: All patients receiving prescriptions (written or electronic).  Pharmacy of record: Pharmacy where electronic prescriptions will be sent. If written prescriptions are taken to a different pharmacy, please inform the nursing staff. The pharmacy listed in the electronic medical record should be the one where you would like electronic prescriptions to be sent.  Electronic prescriptions: In compliance with the [place] (STOP) Act of 2017 (Session Lanny Cramp [phone]), effective January 28, 2018, all controlled substances must be electronically prescribed. Calling prescriptions to the pharmacy will cease to exist.  Prescription refills: Only during scheduled appointments. Applies to all prescriptions.  NOTE: The following applies primarily to controlled substances (Opioid* Pain Medications).   Patient's responsibilities: 1. Pain Pills: Bring all pain pills to every appointment (except for procedure appointments). 2. Pill Bottles: Bring pills in original pharmacy bottle.  Always bring the newest bottle. Bring bottle, even if empty. 3. Medication refills: You are responsible for knowing and keeping track of what medications  you take and those you need refilled. The day before your appointment: write a list of all prescriptions that need to be refilled. The day of the appointment: give the list to the admitting nurse. Prescriptions will be written only during appointments. If you forget a medication: it will not be "Called in", "Faxed", or "electronically sent". You will need to get another appointment to get these prescribed. No early refills. Do not call asking to have your prescription filled early. 4. Prescription Accuracy: You are responsible for carefully inspecting your prescriptions before leaving our office. Have the discharge nurse carefully go over each prescription with you, before taking them home. Make sure that your name is accurately spelled, that your address is correct. Check the name and dose of your medication to make sure it is accurate. Check the number of pills, and the written instructions to make sure they are clear and accurate. Make sure that you are given enough medication to last until your next medication refill appointment. 5. Taking Medication: Take medication as prescribed. When it comes to controlled substances, taking less pills or less frequently than prescribed is permitted and encouraged. Never take more pills than instructed. Never take medication more frequently than prescribed.  6. Inform other Doctors: Always inform, all of your healthcare providers, of all the medications you take. 7. Pain Medication from other Providers: You are not allowed to accept any additional pain medication from any other Doctor or Healthcare provider. There are two exceptions to this rule. (see below) In the event that you require additional pain medication, you are responsible for notifying us, as stated below. 8. Medication Agreement: You are responsible  for carefully reading and following our Medication Agreement. This must be signed before receiving any prescriptions from our practice. Safely store a copy of your signed Agreement. Violations to the Agreement will result in no further prescriptions. (Additional copies of our Medication Agreement are available upon request.) 9. Laws, Rules, & Regulations: All patients are expected to follow all Federal and [employer], [employer], Rules, [employer]. Ignorance of the Laws does not constitute a valid excuse. The use of any illegal substances is prohibited. 10. Adopted CDC guidelines & recommendations: Target dosing levels will be at or below 60 MME/day. Use of benzodiazepines** is not recommended.  Exceptions: There are only two exceptions to the rule of not receiving pain medications from other Healthcare Providers. 1. Exception #1 (Emergencies): In the event of an emergency (i.e.: accident requiring emergency care), you are allowed to receive additional pain medication. However, you are responsible for: As soon as you are able, call our office [phone], at any time of the day or night, and leave a message stating your name, the date and nature of the emergency, and the name and dose of the medication prescribed. In the event that your call is answered by a member of our staff, make sure to document and save the date, time, and the name of the person that took your information.  2. Exception #2 (Planned Surgery): In the event that you are scheduled by another doctor or dentist to have any type of surgery or procedure, you are allowed (for a period no longer than 30 days), to receive additional pain medication, for the acute post-op pain. However, in this case, you are responsible for picking up a copy of our "Post-op Pain Management for Surgeons" handout, and giving it to your surgeon or dentist. This document is available at our office, and does not require an  appointment to obtain it. Simply go to our  office during business hours (Monday-Thursday from 8:00 AM to 4:00 PM) (Friday 8:00 AM to 12:00 Noon) or if you have a scheduled appointment with [place], prior to your surgery, and ask for it by name. In addition, you will need to provide [place] with your name, name of your surgeon, type of surgery, and date of procedure or surgery.  *Opioid medications include: morphine, codeine, oxycodone, oxymorphone, hydrocodone, hydromorphone, meperidine, tramadol, tapentadol, buprenorphine, fentanyl, methadone. **Benzodiazepine medications include: diazepam (Valium), alprazolam (Xanax), clonazepam (Klonopine), lorazepam (Ativan), clorazepate (Tranxene), chlordiazepoxide (Librium), estazolam (Prosom), oxazepam (Serax), temazepam (Restoril), triazolam (Halcion) (Last updated: 03/27/2017) ____________________________________________________________________________________________

## 2018-02-17 NOTE — Progress Notes (Signed)
Nursing Pain Medication Assessment:  Safety precautions to be maintained throughout the outpatient stay will include: orient to surroundings, keep bed in low position, maintain call bell within reach at all times, provide assistance with transfer out of bed and ambulation.  Medication Inspection Compliance: Pill count conducted under aseptic conditions, in front of the patient. Neither the pills nor the bottle was removed from the patient's sight at any time. Once count was completed pills were immediately returned to the patient in their original bottle.  Medication: Oxycodone/APAP Pill/Patch Count: 0 of 75 pills remain Pill/Patch Appearance: Markings consistent with prescribed medication Bottle Appearance: Standard pharmacy container. Clearly labeled. Filled Date: 38 / 22 / 2019 Last Medication intake:  Yesterday

## 2018-02-17 NOTE — Patient Instructions (Signed)
____________________________________________________________________________________________  Medication Rules  Purpose: To inform patients, and their family members, of our rules and regulations.  Applies to: All patients receiving prescriptions (written or electronic).  Pharmacy of record: Pharmacy where electronic prescriptions will be sent. If written prescriptions are taken to a different pharmacy, please inform the nursing staff. The pharmacy listed in the electronic medical record should be the one where you would like electronic prescriptions to be sent.  Electronic prescriptions: In compliance with the [place] (STOP) Act of 2017 (Session Lanny Cramp [phone]), effective January 28, 2018, all controlled substances must be electronically prescribed. Calling prescriptions to the pharmacy will cease to exist.  Prescription refills: Only during scheduled appointments. Applies to all prescriptions.  NOTE: The following applies primarily to controlled substances (Opioid* Pain Medications).   Patient's responsibilities: 1. Pain Pills: Bring all pain pills to every appointment (except for procedure appointments). 2. Pill Bottles: Bring pills in original pharmacy bottle. Always bring the newest bottle. Bring bottle, even if empty. 3. Medication refills: You are responsible for knowing and keeping track of what medications you take and those you need refilled. The day before your appointment: write a list of all prescriptions that need to be refilled. The day of the appointment: give the list to the admitting nurse. Prescriptions will be written only during appointments. If you forget a medication: it will not be "Called in", "Faxed", or "electronically sent". You will need to get another appointment to get these prescribed. No early refills. Do not call asking to have your prescription filled early. 4. Prescription Accuracy: You are responsible for  carefully inspecting your prescriptions before leaving our office. Have the discharge nurse carefully go over each prescription with you, before taking them home. Make sure that your name is accurately spelled, that your address is correct. Check the name and dose of your medication to make sure it is accurate. Check the number of pills, and the written instructions to make sure they are clear and accurate. Make sure that you are given enough medication to last until your next medication refill appointment. 5. Taking Medication: Take medication as prescribed. When it comes to controlled substances, taking less pills or less frequently than prescribed is permitted and encouraged. Never take more pills than instructed. Never take medication more frequently than prescribed.  6. Inform other Doctors: Always inform, all of your healthcare providers, of all the medications you take. 7. Pain Medication from other Providers: You are not allowed to accept any additional pain medication from any other Doctor or Healthcare provider. There are two exceptions to this rule. (see below) In the event that you require additional pain medication, you are responsible for notifying us, as stated below. 8. Medication Agreement: You are responsible for carefully reading and following our Medication Agreement. This must be signed before receiving any prescriptions from our practice. Safely store a copy of your signed Agreement. Violations to the Agreement will result in no further prescriptions. (Additional copies of our Medication Agreement are available upon request.) 9. Laws, Rules, & Regulations: All patients are expected to follow all Federal and [employer], [employer], Rules, [employer]. Ignorance of the Laws does not constitute a valid excuse. The use of any illegal substances is prohibited. 10. Adopted CDC guidelines & recommendations: Target dosing levels will be at or below 60 MME/day. Use of benzodiazepines** is not  recommended.  Exceptions: There are only two exceptions to the rule of not receiving pain medications from other Healthcare Providers. 1.  Exception #1 (Emergencies): In the event of an emergency (i.e.: accident requiring emergency care), you are allowed to receive additional pain medication. However, you are responsible for: As soon as you are able, call our office [phone], at any time of the day or night, and leave a message stating your name, the date and nature of the emergency, and the name and dose of the medication prescribed. In the event that your call is answered by a member of our staff, make sure to document and save the date, time, and the name of the person that took your information.  2. Exception #2 (Planned Surgery): In the event that you are scheduled by another doctor or dentist to have any type of surgery or procedure, you are allowed (for a period no longer than 30 days), to receive additional pain medication, for the acute post-op pain. However, in this case, you are responsible for picking up a copy of our "Post-op Pain Management for Surgeons" handout, and giving it to your surgeon or dentist. This document is available at our office, and does not require an appointment to obtain it. Simply go to our office during business hours (Monday-Thursday from 8:00 AM to 4:00 PM) (Friday 8:00 AM to 12:00 Noon) or if you have a scheduled appointment with [place], prior to your surgery, and ask for it by name. In addition, you will need to provide [place] with your name, name of your surgeon, type of surgery, and date of procedure or surgery.  *Opioid medications include: morphine, codeine, oxycodone, oxymorphone, hydrocodone, hydromorphone, meperidine, tramadol, tapentadol, buprenorphine, fentanyl, methadone. **Benzodiazepine medications include: diazepam (Valium), alprazolam (Xanax), clonazepam (Klonopine), lorazepam (Ativan), clorazepate (Tranxene), chlordiazepoxide (Librium), estazolam (Prosom),  oxazepam (Serax), temazepam (Restoril), triazolam (Halcion) (Last updated: 03/27/2017) ____________________________________________________________________________________________

## 2018-02-27 ENCOUNTER — Ambulatory Visit
Admit: 2018-02-27 | Discharge: 2018-02-28 | Payer: MEDICARE | Attending: Physical Medicine & Rehabilitation | Primary: Physical Medicine & Rehabilitation

## 2018-02-27 DIAGNOSIS — M5412 Radiculopathy, cervical region: Principal | ICD-10-CM

## 2018-02-27 NOTE — Unmapped (Signed)
Dear Rodney Bender,    Thanks so much for coming to see Dr. Glenice Laine today. It was a pleasure to meet you. This summary reviews the goals and plans we discussed at your visit today.     Below, you will see: a) your working diagnosis b) your treatment plan c) your prognosis and d) your next steps and followup plan.    We care about your quality of life and are committed to helping optimize your functionality.     DIAGNOSIS: Cervical radiculopathy  TREATMENT PLAN:   - MRI to be done to evaluate your neck  - Referral to orthopedic spine surgery. Please do not schedule this appointment until after your MRI is completed.  NEXT STEPS/FOLLOW UP:   - In the future, we could consider an epidural injection. Please call us after your spine surgery appointment.     You may contact me through MyChart of call our office with any questions.     If you develop any red flag signs such as new or progressive motor weakness, sensory deficits, saddle anesthesia (groin numbness), bowel/bladder dysfunction, gait/coordination disturbance, weight loss, or night pain, you should call our office and/or seek prompt evaluation at the nearest ER.    All my best,    Rodney Bender) Glenice Laine, MD  Assistant Professor - PM&R  Assistant Professor - Department of Social Medicine  Interventional Spine Specialist - [facility] [place] [facility] [place]???[facility] [employer]

## 2018-02-27 NOTE — Unmapped (Addendum)
PM&R - [facility]     Patient Name:Cortez Espn Zeman  MRN: [id number]  DOB: February 25, 1970  Age: 48 y.o.     ASSESSMENT & PLAN:     02/27/2018     DIAGNOSIS: Right Cervical Radiculopathy. Previous cervical fusion ~ 2015 at [facility]. Progressive right arm pain and weakness. Exam consistent with right arm weakness. No other red flag signs or symptoms.  TREATMENT PLAN:   - Referral to orthopedic spine surgery  - MRI cervical spine w/o ordered  NEXT STEPS/FOLLOW UP:   - Could consider cervical ESI  - Could consider referral to pain medicine for consideration of spinal cord stimulation    Comprehensive patient education performed today explaining that the care plan will integrate multimodal activity-based rehabilitation techniques to enhance recovery, reduce pain, and facilitate functionality. Risks, benefits, and instructions for all medications prescribed / treatments offered  reviewed extensively with patient, who expresses understanding.  Patient strongly advised regarding red flag signs such as new or progressive motor weakness, sensory deficits, saddle anesthesia, bowel/bladder dysfunction, gait/coordination disturbance, weight loss, and night pain that should prompt evaluation at nearest ER.    A consultation report has been transmitted to the consult requesting physician.    SUBJECTIVE:     Chief Complaint:    Neck Pain (R) and Shoulder Pain (R)    History of Present Illness:   Mr. Alarid is a 48 y.o. year old male being evaluated in consultation at the request of [place] for Neck Pain (R) and Shoulder Pain (R)    The patient has had neck and right arm pain for years. He had a surgery in [place] around 2015 for neck and right arm pain and weakness. The surgery helped temporarily but is not come back and is getting worse. For the past 7 months he has noted progressive right arm pain, weakness. Pain further described below:    Medications:  Oxycodone does not help  Gabapentin eases off the pain some but does not take it all away    Procedures:  Trigger point injections helped temporarily    Therapy:  He has done PT in the past which was of no benefit.     Symptom Location: Neck, right shoulder, right upper arm  Symptom Character: burning, stiffness and aching  Symptom Onset/Mechanism: chronic (>/= 3 months), no dinstinctly identifiable mechanism or activity and no known trauma or injury  Temporal Pattern: constant and getting worse  What number best describes your pain on average in the past week?:  10  What number best describes how, during the past week, pain has interfered with your enjoyment of life?: 10  What number best describes how, during the past week, pain has interfered with your general activity?: 10  Aggravating Factors: standing, lying down, bending, lifting things from the ground  Alleviating Factors: Mild benefit with gabapentin, and with propping his right arm up.  Unintended Weight Loss: no  Fever/Infection:no  Neuromotor Function: motor weakness - (yes) right arm weakness,  gait/coordination disturbance - (no), loss of bowel or bladder control - (no), saddle anesthesia - (no)   Employment Status: Not employed  Employment Satisfaction: NA    Current Outpatient Medications   Medication Sig Dispense Refill   ??? busPIRone (BUSPAR) 10 MG tablet Take 10 mg by mouth Two (2) times a day.     ??? citalopram (CELEXA) 20 MG tablet Take 1 tablet (20 mg total) by mouth daily. 90 tablet 3   ??? dronabinol (MARINOL) 10 MG capsule TAKE  1 CAPSULE BY MOUTH TWICE DAILY 30  MINUTES  BEFORE  A  MEAL 60 each 0   ??? gabapentin (NEURONTIN) 300 MG capsule Take 300 mg by mouth Three (3) times a day.     ??? hydroxyzine (ATARAX) 25 MG tablet Take 1 tablet (25 mg total) by mouth every six (6) hours as needed. 90 capsule 3   ??? 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 oxyCODONE-acetaminophen (PERCOCET) 10-325 mg per tablet Take 1 tablet by mouth every eight (8) hours as needed for pain.      ??? prochlorperazine (COMPAZINE) 5 MG tablet Take 1 tablet (5 mg total) by mouth every eight (8) hours as needed for nausea (vomiting). 30 tablet 2   ??? sildenafil (VIAGRA) 50 MG tablet Take 50 mg by mouth daily as needed for erectile dysfunction.     ??? ustekinumab (STELARA) 90 mg/mL Syrg syringe Inject 1 mL (90 mg total) under the skin every 8 weeks. 1 mL 5   ??? vancomycin (VANCOCIN) 250 MG capsule Take according to prescribed taper (this is the remaining quantity to supplement the fill on 11/26) 23 capsule 0     No current facility-administered medications for this visit.        Allergies:   Dilaudid [hydromorphone]; Morphine; Opioids - morphine analogues; Tramadol; and Vicodin [hydrocodone-acetaminophen]    Past Medical / Surgical History:     Past Medical History:   Diagnosis Date   ??? Anxiety    ??? Crohn's disease (CMS-HCC)     diagnosed in 1990   ??? GERD (gastroesophageal reflux disease)    ??? Hypertension 10/08/2016     Patient Active Problem List    Diagnosis Date Noted   ??? Clostridium difficile enteritis 12/09/2017   ??? Chronic abdominal pain 01/08/2017   ??? Crohn's disease of both small and large intestine with other complication (CMS-HCC) 09/13/2016   ??? Nonadherence to medical treatment 10/14/2012   ??? 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 Tobacco use disorder 03/25/2009   ??? Mixed, or nondependent drug abuse, in remission (CMS-HCC) 03/25/2009     Past Surgical History:   Procedure Laterality Date   ??? COLON SURGERY     ??? PR COLONOSCOPY FLX DX W/COLLJ SPEC WHEN PFRMD Left 11/10/2012    Procedure: COLONOSCOPY, FLEXIBLE, PROXIMAL TO SPLENIC FLEXURE; DIAGNOSTIC, W/WO COLLECTION SPECIMEN BY BRUSH OR WASH;  Surgeon: Malcolm Metro, MD;  [facility];  Service: Gastroenterology   ??? PR COLONOSCOPY FLX DX W/COLLJ SPEC WHEN PFRMD N/A 07/21/2013    Procedure: COLONOSCOPY, FLEXIBLE, PROXIMAL TO SPLENIC FLEXURE; DIAGNOSTIC, W/WO COLLECTION SPECIMEN BY BRUSH OR WASH;  Surgeon: Gwen Pounds, MD;  Location: [facility];  Service: Gastroenterology   ??? PR COLONOSCOPY FLX DX W/COLLJ SPEC WHEN PFRMD N/A 08/09/2016    Procedure: COLONOSCOPY, FLEXIBLE, PROXIMAL TO SPLENIC FLEXURE; DIAGNOSTIC, W/WO COLLECTION SPECIMEN BY BRUSH OR WASH;  Surgeon: Janyth Pupa, MD;  Location: [facility];  Service: Gastroenterology   ??? PR COLONOSCOPY W/BIOPSY SINGLE/MULTIPLE  07/23/2012    Procedure: COLONOSCOPY, FLEXIBLE, PROXIMAL TO SPLENIC FLEXURE; WITH BIOPSY, SINGLE OR MULTIPLE;  Surgeon: Vickii Chafe, MD;  Location: [facility];  Service: Gastroenterology   ??? PR COLONOSCOPY W/BIOPSY SINGLE/MULTIPLE N/A 07/15/2014    Procedure: COLONOSCOPY, FLEXIBLE, PROXIMAL TO SPLENIC FLEXURE; WITH BIOPSY, SINGLE OR MULTIPLE;  Surgeon: Janyth Pupa, MD;  Location: [facility];  Service:  Gastroenterology   ??? PR COLONOSCOPY W/BIOPSY SINGLE/MULTIPLE N/A 03/21/2017    Procedure: COLONOSCOPY, FLEXIBLE, PROXIMAL TO SPLENIC FLEXURE; WITH BIOPSY, SINGLE OR MULTIPLE;  Surgeon: Modena Nunnery, MD;  Location: GI PROCEDURES [place] [facility];  Service: Gastroenterology   ??? PR COLSC FLEXIBLE W/TRANSENDOSCOPIC BALLOON DILAT N/A 07/15/2014    Procedure: COLONOSCOPY, FLEXIBLE; WITH DILATION BY BALLOON, 1 OR MORE STRICTURES;  Surgeon: Janyth Pupa, MD;  Location: [facility];  Service: Gastroenterology   ??? PR REMVL COLON & TERM ILEUM W/ILEOCOLOSTOMY N/A 09/16/2016    Procedure: COLECTOMY, PARTIAL, WITH REMOVAL OF TERMINAL ILEUM WITH ILEOCOLOSTOMY;  Surgeon: Lady Gary, MD;  Location: MAIN OR [place];  Service: Gastrointestinal   ??? PR UPPER GI ENDOSCOPY,BIOPSY N/A 07/23/2012    Procedure: UGI ENDOSCOPY; WITH BIOPSY, SINGLE OR MULTIPLE;  Surgeon: Vickii Chafe, MD;  Location: [facility];  Service: Gastroenterology   ??? PR UPPER GI ENDOSCOPY,DIAGNOSIS N/A 11/10/2012    Procedure: UGI ENDO, INCLUDE ESOPHAGUS, STOMACH, & DUODENUM &/OR JEJUNUM; DX W/WO COLLECTION SPECIMN, BY BRUSH OR WASH;  Surgeon: Malcolm Metro, MD;  Location: [facility];  Service: Gastroenterology   ??? PR UPPER GI ENDOSCOPY,DIAGNOSIS N/A 07/21/2013    Procedure: UGI ENDO, INCLUDE ESOPHAGUS, STOMACH, & DUODENUM &/OR JEJUNUM; DX W/WO COLLECTION SPECIMN, BY BRUSH OR WASH;  Surgeon: Gwen Pounds, MD;  [facility]: [facility];  Service: Gastroenterology   ??? PR UPPER GI ENDOSCOPY,DIAGNOSIS N/A 07/15/2014    Procedure: UGI ENDO, INCLUDE ESOPHAGUS, STOMACH, & DUODENUM &/OR JEJUNUM; DX W/WO COLLECTION SPECIMN, BY BRUSH OR WASH;  Surgeon: Janyth Pupa, MD;  Location: [facility];  Service: Gastroenterology       Social History     Socioeconomic History   ??? Marital status: Single     Spouse name: Not on file   ??? Number of children: Not on file   ??? Years of education: Not on file   ??? Highest education level: Not on file   Occupational History   ??? Not on file   Social Needs   ??? Financial resource strain: Not hard at all   ??? Food insecurity     Worry: Never true     Inability: Never true   ??? Transportation needs     Medical: No     Non-medical: No   Tobacco Use   ??? Smoking status: Former Smoker     Packs/day: 1.00     Years: 18.00     Pack years: 18.00     Types: Cigarettes     Start date: 08/27/2003     Last attempt to quit: 06/06/2017     Years since quitting: 0.7   ??? Smokeless tobacco: Never Used   ??? Tobacco comment: Pt smokes 1ppd, Pt is interested in tobacco cessation    Substance and Sexual Activity   ??? Alcohol use: No   ??? Drug use: Yes     Types: Marijuana   ??? Sexual activity: Yes     Partners: Female     Birth control/protection: Condom   Lifestyle   ??? Physical activity     Days per week: 3 days     Minutes per session: 30 min   ??? Stress: Very much   Relationships   ??? Social connections     Talks on phone: More than three times a week     Gets together: More than three times a week     Attends  religious service: 1 to 4 times per year     Active member of club or organization: Yes     Attends meetings of clubs or organizations: 1 to 4 times per year     Relationship status: Married   Other Topics Concern   ??? Not on file   Social History Narrative   ??? Not on file       Family History   Problem Relation Age of Onset   ??? Hyperlipidemia Father    ??? Cancer Maternal Aunt    ??? No Known Problems Mother    ??? No Known Problems Sister    ??? No Known Problems Brother    ??? No Known Problems Maternal Uncle    ??? No Known Problems Paternal Aunt    ??? No Known Problems Paternal Uncle    ??? No Known Problems Maternal Grandmother    ??? No Known Problems Maternal Grandfather    ??? No Known Problems Paternal Grandmother    ??? No Known Problems Paternal Grandfather    ??? Anesthesia problems Neg Hx    ??? Broken bones Neg Hx    ??? Clotting disorder Neg Hx    ??? Collagen disease Neg Hx    ??? Diabetes Neg Hx    ??? Dislocations Neg Hx    ??? Fibromyalgia Neg Hx    ??? Gout Neg Hx    ??? Hemophilia Neg Hx    ??? Osteoporosis Neg Hx    ??? Rheumatologic disease Neg Hx    ??? Scoliosis Neg Hx    ??? Severe sprains Neg Hx    ??? Sickle cell anemia Neg Hx    ??? Spinal Compression Fracture Neg Hx        For any of the above entries which indicate no records on file, the patient reports no relevant history.                 Review of Systems:   Pertinent positives are noted in HPI or flowsheet and are otherwise negative.  Patient has been instructed to followup with PCP or appropriate specialist for symptoms outside the purview of this speciality.    OBJECTIVE:     Vitals:     BP 163/96  - Pulse 63  - Ht 180.3 cm (5' 11)  - Wt 63.8 kg (140 lb 11.2 oz)  - BMI 19.62 kg/m??     The above medications, allergies, history, and ROS, and vitals have been reviewed.    Physical Exam:   GEN: alert and oriented, no apparent distress  HEENT: normocephalic, atraumatic, anicteric, moist mucous membranes  CV: normal heart rate  PULM: normal work of breathing  EXT: no swelling, edema in b/l UE and LE  SKIN: no visible ecchymosis or breakdown  PSYCH: normal mood and affect    NEURO:   Motor   LUE 5/5 shoulder abductor, 5/5 elbow flexor, 5/5 elbow extender 5/5 wrist extensor, 5/5 wrist flexor, 5/5 finger abductor  RUE 4/5 shoulder abductor, 4/5 elbow flexor, 4/5 elbow extender 4/5 wrist extensor, 4/5 wrist flexor, 4/5 finger abductor    Sensory  Sensation to light touch WNL throughout b/l UE and LE    Reflexes  RLE: 2+ patella, 2+ Achilles  Hoffman's: - (negative) LUE, - (negative) RUE  Clonus: - (negative) LLE, - (negative) RLE    MSK:  Gait and Station: normal nonantalgic gait with symmetric body posture    Cervical Spine:   Inspection: Normal alignment. No erythema, discoloration,  or asymmetry.  Palpation: No paraspinal muscle tenderness.  No vertebral body point tenderness.  ROM: normal flexion, extension, rotation, lateral bend  Facet Loading Pain: - (negative) left, - (negative) right  Spurling's: - (negative) LUE, - (negative) RUE    IMAGING:  Non pertinent     I saw and evaluated the patient, participating in the key portions of the service.  I reviewed the resident???s note.  I agree with the resident???s findings and plan.    Nicanor Bake) Glenice Laine, MD  Assistant Professor - PM&R  Assistant Professor - Department of Social Medicine  Interventional Spine Specialist - [facility] [place] [employer]

## 2018-03-02 MED ORDER — DRONABINOL 10 MG CAPSULE
0 refills | 0 days | Status: CP
Start: 2018-03-02 — End: 2018-03-31

## 2018-03-06 ENCOUNTER — Ambulatory Visit: Admit: 2018-03-06 | Discharge: 2018-03-07 | Payer: MEDICARE

## 2018-03-06 DIAGNOSIS — K50012 Crohn's disease of small intestine with intestinal obstruction: Principal | ICD-10-CM

## 2018-03-06 LAB — COMPREHENSIVE METABOLIC PANEL
ALBUMIN: 4.4 g/dL (ref 3.5–5.0)
ALKALINE PHOSPHATASE: 91 U/L (ref 38–126)
ALT (SGPT): 14 U/L (ref <50)
ANION GAP: 10 mmol/L (ref 7–15)
AST (SGOT): 40 U/L (ref 19–55)
BILIRUBIN TOTAL: 0.6 mg/dL (ref 0.0–1.2)
BLOOD UREA NITROGEN: 10 mg/dL (ref 7–21)
BUN / CREAT RATIO: 10
CHLORIDE: 101 mmol/L (ref 98–107)
CO2: 25 mmol/L (ref 22.0–30.0)
CREATININE: 1.02 mg/dL (ref 0.70–1.30)
EGFR CKD-EPI AA MALE: >90 mL/min/{1.73_m2} (ref >=60)
EGFR CKD-EPI NON-AA MALE: 88 mL/min/{1.73_m2} (ref >=60)
GLUCOSE RANDOM: 76 mg/dL (ref 70–179)
POTASSIUM: 4.4 mmol/L (ref 3.5–5.0)
PROTEIN TOTAL: 7.8 g/dL (ref 6.5–8.3)
SODIUM: 136 mmol/L (ref 135–145)

## 2018-03-06 LAB — CBC
HEMATOCRIT: 38.5 % — ABNORMAL LOW (ref 41.0–53.0)
HEMOGLOBIN: 12.8 g/dL — ABNORMAL LOW (ref 13.5–17.5)
MEAN CORPUSCULAR VOLUME: 93.9 fL (ref 80.0–100.0)
MEAN PLATELET VOLUME: 7.6 fL (ref 7.0–10.0)
PLATELET COUNT: 311 10*9/L (ref 150–440)
RED BLOOD CELL COUNT: 4.1 10*12/L — ABNORMAL LOW (ref 4.50–5.90)
WBC ADJUSTED: 7.3 10*9/L (ref 4.5–11.0)

## 2018-03-06 LAB — VITAMIN B-12: Cobalamins:MCnc:Pt:Ser/Plas:Qn:: 258

## 2018-03-06 LAB — C-REACTIVE PROTEIN: C reactive protein:MCnc:Pt:Ser/Plas:Qn:: <5

## 2018-03-06 LAB — WBC ADJUSTED: Lab: 7.3

## 2018-03-06 LAB — BILIRUBIN TOTAL: Bilirubin:MCnc:Pt:Ser/Plas:Qn:: 0.6

## 2018-03-06 LAB — FERRITIN: Ferritin:MCnc:Pt:Ser/Plas:Qn:: 90

## 2018-03-06 MED ORDER — PROCHLORPERAZINE MALEATE 5 MG TABLET
ORAL_TABLET | Freq: Three times a day (TID) | ORAL | 5 refills | 0.00000 days | Status: CP | PRN
Start: 2018-03-06 — End: ?

## 2018-03-06 NOTE — Unmapped (Signed)
1. Return in about 6 months (around 09/04/2018). Schedule at the check-out desk or call [phone] to schedule follow-up.  2. Reach out to [place] at my e-mail or by calling Robin at the numbers below if you have any questions or problems in the interim.  3. If you do not receive a call to schedule any tests or visits with doctors we refer you to, please call the scheduling numbers below or reach out to my e-mail or Robin for help scheduling.  4. Continue stellara injections for your Crohn's disease.  5. We'll check basic labs for your Crohn's disease, but symptomatically it sounds like it is well controlled.  6. We'll continue Marinol for appetite, congratulations on your weight gain.  7. We'll refill the Compazine for nausea. You can take two of these to see if it works better, but don't take more than four total in a day.  8. If the nausea persists or gets worse, or if you develop any new symptoms, please reach out to [place].    -------------------------------------------------------------------------------    If you have any questions or concerns please contact us as below:    My email is [email or web address]  For appointments or clinical questions, please call Robin at [phone].  For emergencies after hours call [phone] and ask for the GI medicine fellow on call.    For scheduling radiology appointments [phone].  For scheduling GI procedures (e.g,. Colonoscopy), please call [phone].    ==============================================

## 2018-03-06 NOTE — Unmapped (Signed)
[place] GASTROENTEROLOGY CONSULTATION CLINIC VISIT    REFERRING PROVIDER: Benjie Karvonen [facility]  [address]  [facility]  [place], [place] [address]    PRIMARY CARE PROVIDER: Ivery Quale, MD    PATIENT PROFILE: Rodney Bender is a 48 y.o. man who is seen in consultation at the request of Dr. [facility] for evaluation of Crohn's disease.    SUBJECTIVE:     Chief Complaint     Follow-up; Crohn's Disease; Abdominal Pain; Nausea        HISTORY OF PRESENT ILLNESS:      Rodney Bender is a 48 y.o. man with history significant for ileal stricturing Crohn's disease s/p four ileocecal resections with a long history of adherence issues and fractured care across institutions who presents for routine follow-up.    Crohn's disease history:  1. Stricturing ileocolonic Crohn's disease diagnosed 1990  2. Status post ileocecal resection 1998 due to stricture, with segmental ileocolectomy 2009 due to severe recurrent stricturing Crohn's disease.  3. On Remicade for a short time in the past, however discontinued due to noncompliance and transportation problems.  4. Previous he followed by Dr. Gwenith Spitz here at [facility] but terminated due to repeated no-shows and medical noncompliance  5. Previously on 6-MP, however this medication was discontinued after patient had undetectable drug levels on a recent admission  6. Recalls being on Humira at one point, but unclear exactly when.  7. He had additional ileocolonic resection by [facility] 09/16/2016, doing clinically much better for about six months after that, started on Stelara getting injections on site to insure adherence.  8. Stricturing ileocolonic Crohn's disease diagnosed 1990  9. Status post ileocecal resection 1998 due to stricture, with segmental ileocolectomy 2009 due to severe recurrent stricturing Crohn's disease.  10. On Remicade for a short time in the past, however discontinued due to noncompliance and transportation problems. 11. Previous he followed by Dr. Gwenith Spitz here at [facility] but terminated due to repeated no-shows and medical noncompliance  12. Previously on 6-MP, however this medication was discontinued after patient had undetectable drug levels on a recent admission  13. Recalls being on Humira at one point, but unclear exactly when.  14. He had additional ileocolonic resection by [facility] 09/16/2016, doing clinically much better for about six months after that, started on Stelara getting injections on site to insure adherence.  15. Symptoms improved and gained weight after surgery, did very well for several months, then developed nausea and vomiting and recurrent chronic abdominal pain. Imaging and labs were reassuring against flare and colonoscopy was without active disease.     Interval History:  He has been throwing up about once a day, worse after bowel movements. This sometimes wakes him up at night, but most commonly occurs firsth thing in the morning. He is have once daily bowel movements, sometimes skips a day. These are formed without blood. He still has abdominal pain but follows with a pain clinic and oxycodone is working reasonably well for him. He quit smoking and a lot of his chronic symptoms improved with smoking cessation. His weight is up a bit and eating better, feels Rodney Bender helps a good deal.    REVIEW OF SYSTEMS:   The balance of 12 systems reviewed is negative except as noted in the history of present illness.    Past Medical History:   Diagnosis Date   ??? Anxiety    ??? Crohn's disease (CMS-HCC)     diagnosed  in 1990   ??? GERD (gastroesophageal reflux disease)    ??? Hypertension 10/08/2016     Past Surgical History:   Procedure Laterality Date   ??? COLON SURGERY     ??? PR COLONOSCOPY FLX DX W/COLLJ SPEC WHEN PFRMD Left 11/10/2012    Procedure: COLONOSCOPY, FLEXIBLE, PROXIMAL TO SPLENIC FLEXURE; DIAGNOSTIC, W/WO COLLECTION SPECIMEN BY BRUSH OR WASH;  Surgeon: Malcolm Metro, MD;  [facility]; Service: Gastroenterology   ??? PR COLONOSCOPY FLX DX W/COLLJ SPEC WHEN PFRMD N/A 07/21/2013    Procedure: COLONOSCOPY, FLEXIBLE, PROXIMAL TO SPLENIC FLEXURE; DIAGNOSTIC, W/WO COLLECTION SPECIMEN BY BRUSH OR WASH;  Surgeon: Gwen Pounds, MD;  [facility]: [facility];  Service: Gastroenterology   ??? PR COLONOSCOPY FLX DX W/COLLJ SPEC WHEN PFRMD N/A 08/09/2016    Procedure: COLONOSCOPY, FLEXIBLE, PROXIMAL TO SPLENIC FLEXURE; DIAGNOSTIC, W/WO COLLECTION SPECIMEN BY BRUSH OR WASH;  Surgeon: Janyth Pupa, MD;  Location: [facility];  Service: Gastroenterology   ??? PR COLONOSCOPY W/BIOPSY SINGLE/MULTIPLE  07/23/2012    Procedure: COLONOSCOPY, FLEXIBLE, PROXIMAL TO SPLENIC FLEXURE; WITH BIOPSY, SINGLE OR MULTIPLE;  Surgeon: Vickii Chafe, MD;  [facility]: [facility];  Service: Gastroenterology   ??? PR COLONOSCOPY W/BIOPSY SINGLE/MULTIPLE N/A 07/15/2014    Procedure: COLONOSCOPY, FLEXIBLE, PROXIMAL TO SPLENIC FLEXURE; WITH BIOPSY, SINGLE OR MULTIPLE;  Surgeon: Janyth Pupa, MD;  Location: [facility];  Service: Gastroenterology   ??? PR COLONOSCOPY W/BIOPSY SINGLE/MULTIPLE N/A 03/21/2017    Procedure: COLONOSCOPY, FLEXIBLE, PROXIMAL TO SPLENIC FLEXURE; WITH BIOPSY, SINGLE OR MULTIPLE;  Surgeon: Modena Nunnery, MD;  Location: [facility] [place] [facility];  Service: Gastroenterology   ??? PR COLSC FLEXIBLE W/TRANSENDOSCOPIC BALLOON DILAT N/A 07/15/2014    Procedure: COLONOSCOPY, FLEXIBLE; WITH DILATION BY BALLOON, 1 OR MORE STRICTURES;  Surgeon: Janyth Pupa, MD;  Location: [facility];  Service: Gastroenterology   ??? PR REMVL COLON & TERM ILEUM W/ILEOCOLOSTOMY N/A 09/16/2016    Procedure: COLECTOMY, PARTIAL, WITH REMOVAL OF TERMINAL ILEUM WITH ILEOCOLOSTOMY;  Surgeon: Lady Gary, MD;  Location: MAIN OR [place];  Service: Gastrointestinal   ??? PR UPPER GI ENDOSCOPY,BIOPSY N/A 07/23/2012    Procedure: UGI ENDOSCOPY; WITH BIOPSY, SINGLE OR MULTIPLE;  Surgeon: Vickii Chafe, MD;  [facility];  Service: Gastroenterology   ??? PR UPPER GI ENDOSCOPY,DIAGNOSIS N/A 11/10/2012    Procedure: UGI ENDO, INCLUDE ESOPHAGUS, STOMACH, & DUODENUM &/OR JEJUNUM; DX W/WO COLLECTION SPECIMN, BY BRUSH OR WASH;  Surgeon: Malcolm Metro, MD;  Location: [facility];  Service: Gastroenterology   ??? PR UPPER GI ENDOSCOPY,DIAGNOSIS N/A 07/21/2013    Procedure: UGI ENDO, INCLUDE ESOPHAGUS, STOMACH, & DUODENUM &/OR JEJUNUM; DX W/WO COLLECTION SPECIMN, BY BRUSH OR WASH;  Surgeon: Gwen Pounds, MD;  Location: [facility];  Service: Gastroenterology   ??? PR UPPER GI ENDOSCOPY,DIAGNOSIS N/A 07/15/2014    Procedure: UGI ENDO, INCLUDE ESOPHAGUS, STOMACH, & DUODENUM &/OR JEJUNUM; DX W/WO COLLECTION SPECIMN, BY BRUSH OR WASH;  Surgeon: Janyth Pupa, MD;  Location: [facility];  Service: Gastroenterology   ??? SPINE SURGERY       Current Outpatient Medications   Medication Sig Dispense Refill   ??? busPIRone (BUSPAR) 10 MG tablet Take 10 mg by mouth Two (2) times a day. Takes as needed     ??? citalopram (CELEXA) 20 MG tablet Take 1 tablet (20 mg total) by mouth daily. (Patient taking differently:  Take 20 mg by mouth daily. Takes as needed) 90 tablet 3   ??? dronabinol (Rodney Bender) 10 MG capsule TAKE 1 CAPSULE BY MOUTH TWICE DAILY 30 MINUTES BEFORE A MEAL 60 each 0   ??? gabapentin (NEURONTIN) 300 MG capsule Take 300 mg by mouth Three (3) times a day. Takes as needed     ??? hydroxyzine (ATARAX) 25 MG tablet Take 1 tablet (25 mg total) by mouth every six (6) hours as needed. 90 capsule 3   ??? oxyCODONE-acetaminophen (PERCOCET) 10-325 mg per tablet Take 1 tablet by mouth every eight (8) hours as needed for pain.      ??? prochlorperazine (COMPAZINE) 5 MG tablet Take 1-2 tablets (5-10 mg total) by mouth every eight (8) hours as needed for nausea (vomiting). 60 tablet 5   ??? sildenafil (VIAGRA) 50 MG tablet Take 50 mg by mouth daily as needed for erectile dysfunction.     ??? ustekinumab (STELARA) 90 mg/mL Syrg syringe Inject 1 mL (90 mg total) under the skin every 8 weeks. 1 mL 5   ??? 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 vancomycin (VANCOCIN) 250 MG capsule Take according to prescribed taper (this is the remaining quantity to supplement the fill on 11/26) (Patient not taking: Reported on 03/06/2018) 23 capsule 0     No current facility-administered medications for this visit.      Allergies  Reviewed at 1:57 PM      Reactions Comment    Dilaudid [hydromorphone] Anxiety     Morphine Itching     Opioids - Morphine Analogues Itching     Tramadol Itching, Anxiety     Vicodin [hydrocodone-acetaminophen] Anxiety         Family History   Problem Relation Age of Onset   ??? Hyperlipidemia Father    ??? Cancer Father    ??? Cancer Maternal Aunt    ??? Stroke Mother    ??? No Known Problems Sister    ??? No Known Problems Brother    ??? No Known Problems Maternal Uncle    ??? No Known Problems Paternal Aunt    ??? No Known Problems Paternal Uncle    ??? No Known Problems Maternal Grandmother    ??? No Known Problems Maternal Grandfather    ??? No Known Problems Paternal Grandmother    ??? No Known Problems Paternal Grandfather    ??? Anesthesia problems Neg Hx    ??? Broken bones Neg Hx    ??? Clotting disorder Neg Hx    ??? Collagen disease Neg Hx    ??? Diabetes Neg Hx    ??? Dislocations Neg Hx    ??? Fibromyalgia Neg Hx    ??? Gout Neg Hx    ??? Hemophilia Neg Hx    ??? Osteoporosis Neg Hx    ??? Rheumatologic disease Neg Hx    ??? Scoliosis Neg Hx    ??? Severe sprains Neg Hx    ??? Sickle cell anemia Neg Hx    ??? Spinal Compression Fracture Neg Hx      Social History     Tobacco Use   ??? Smoking status: Former Smoker     Packs/day: 1.00     Years: 18.00     Pack years: 18.00     Types: Cigarettes     Start date: 08/27/2003     Last attempt to quit: 06/06/2017     Years since quitting: 0.7   ???  Smokeless tobacco: Never Used   ??? Tobacco comment: Pt smokes 1ppd, Pt is interested in tobacco cessation    Substance Use Topics   ??? Alcohol use: No   ??? Drug use: Not Currently     Types: Marijuana       OBJECTIVE:   VITAL SIGNS: BP 111/69  - Pulse 64  - Temp 36.8 ??C (Temporal)  - Wt 63 kg (138 lb 14.4 oz)  - BMI 19.37 kg/m??     Wt Readings from Last 6 Encounters:   03/06/18 63 kg (138 lb 14.4 oz)   02/27/18 63.8 kg (140 lb 11.2 oz)   01/13/18 63 kg (138 lb 12.8 oz)   12/30/17 58.5 kg (129 lb)   12/20/17 59.2 kg (130 lb 8 oz)   12/09/17 57.8 kg (127 lb 6.8 oz)       PHYSICAL EXAM:  General appearance: mild distress and chronically ill appearing.  Eyes: Anicteric sclera. No erythema.  ENT: No oral ulcers. Posterior oropharynx unremarkable.  Cardiovascular: RRR without murmurs, heaves, or thrills. No lower extremity edema.  Pulmonary: Normal work of breathing. Acyanotic.  Abdominal: soft, nontender, nondistended, no masses or organomegaly.  Musculoskeletal: No temporal wasting. Normal joints of the hand.  Skin: No jaundice. No rashes.  Neurologic: Alert, oriented, and appropriate.  Psychiatric: Appropriate.    GI PROCEDURES:  Esophagogastroduodenoscopy (EGD) - 03/21/2017 - The examined portion of the ileum was normal. - Patent end-to-side ileo-colonic anastomosis, characterized by healthy appearing mucosa except one single aphteous lesion. Rutgeerts i1. - One 2 mm polyp in the ascending colon, removed with a jumbo cold forceps. Resected and retrieved. - The examination was otherwise normal. Overall no significant recurrence of Crohn's disease.    Colonoscopy - 08/09/2016 - The perianal and digital rectal examinations were normal. There was evidence of a prior end-to-side ileo-colonic anastomosis in the ascending colon. This was non-patent and was characterized by erythema, friable mucosa, inflammation and ulceration. The anastomosis could not be traversed. The exam was otherwise without abnormality on direct and retroflexion views. The colonic mucosa was normal throughout.    EGD/Colonoscopy - 07/15/2014 - Normal esophagus. - Normal stomach. - Normal examined duodenum. Aphtha in the sigmoid colon and in the descending colon. - Patent end-to-end ileo-colonic anastomosis, characterized by congestion, edema, erythema, friable mucosa, a hemorrhagic appearance, inflammation and mild stenosis. Dilated. Biopsied. - Aphtha in the distal ileum. - The examination was otherwise normal. - Non-bleeding internal hemorrhoids.    EGD/Colonoscopy - 07/21/2013 - Normal esophagus. - Normal stomach. - Normal examined duodenum. - No specimens collected. Non-thrombosed internal hemorrhoids found on perianal exam. - The entire examined colon is normal. - Patent functional end-to-end ileo-colonic anastomosis, characterized by healthy appearing mucosa. Rutgeert's stage I0. - The examined portion of the ileum was normal for 15 cm. - Internal hemorrhoids.    EGD/Colonoscopy - 11/10/2012 - Normal esophagus. - Normal examined duodenum. - Hiatus hernia. Patent end-to-side ileo-colonic anastomosis. - Crohn's disease with ileitis of distal 4 cm of neoterminal ileum. - The entire examined colon is normal.    EGD/Colonoscopy -  07/23/2012 - LA Grade B esophagitis.- Erythematous mucosa in the prepyloric region of the stomach. Biopsied. - Duodenitis. Biopsied. - Normal 2nd part of the duodenum. The entire examined colon is normal. - End-to-side ileo-colonic anastomosis with inflammation on the neoterminal side with stenosis of efferent and afferent limbs. This was not traversed. This was biopsied.    RADIOGRAPHIC STUDIES:  CT abdomen and pelvis with contrast - 03/13/2017 - Status  post ileocecectomy with ileocolic anastomosis. No dilated or thick-walled loops of bowel. No bowel obstruction. No ascites. No acute intra-abdominal abnormality.    Ct Abdomen Pelvis W Iv Contrast Only - 08/08/2016 - Short segment of bowel wall thickening at the distal ileum just prior to the ileocolic anastomosis. This could represent a Crohn's flare as clinically questioned.     LABORATORY RESULTS:  CBC:  Lab Results   Component Value Date    WBC 6.6 12/23/2017    WBC 7.8 12/22/2017    WBC 7.5 12/21/2017    WBC 10.3 07/21/2013    WBC 10.4 07/20/2013    WBC 6.9 07/19/2013    Absolute Neutrophils 4.0 12/23/2017    Absolute Neutrophils 5.3 12/22/2017    Absolute Neutrophils 4.6 12/21/2017    Absolute Neutrophils 9.0 (H) 07/21/2013    Absolute Neutrophils 9.1 (H) 07/20/2013    Absolute Neutrophils 5.7 07/19/2013    HGB 12.3 (L) 12/23/2017    HGB 12.2 (L) 12/22/2017    HGB 11.1 (L) 12/21/2017    HGB 12.5 (L) 07/21/2013    HGB 11.8 (L) 07/20/2013    HGB 11.9 (L) 07/19/2013    Platelet 346 12/23/2017    Platelet 335 12/22/2017    Platelet 331 12/21/2017    Platelet 176 07/21/2013    Platelet 205 07/20/2013    Platelet 191 07/19/2013     BMP:  Lab Results   Component Value Date    Sodium 130 (L) 12/23/2017    Sodium 132 (L) 12/22/2017    Sodium 133 (L) 12/21/2017    Sodium 130 (L) 07/21/2013    Sodium 131 (L) 07/20/2013    Sodium 133 (L) 07/19/2013    Potassium 4.0 12/23/2017    Potassium 4.0 12/22/2017    Potassium 3.9 12/21/2017    Potassium 4.2 07/21/2013    Potassium 4.3 07/20/2013    Potassium 5.3 (H) 07/19/2013    Chloride 92 (L) 12/23/2017    Chloride 99 12/22/2017    Chloride 103 12/21/2017    Chloride 102 07/21/2013    Chloride 103 07/20/2013    Chloride 106 07/19/2013    CO2 29.0 12/23/2017    CO2 21.0 (L) 12/22/2017    CO2 24.0 12/21/2017    CO2 23 07/21/2013    CO2 25 07/20/2013    CO2 21 (L) 07/19/2013    BUN 9 12/23/2017    BUN 7 12/22/2017    BUN 8 12/21/2017    BUN 7 07/21/2013    BUN 7 07/20/2013    BUN 9 07/19/2013    Creatinine 0.90 12/23/2017    Creatinine 0.86 12/22/2017    Creatinine 0.81 12/21/2017    Creatinine 0.78 07/21/2013    Creatinine 0.76 07/20/2013    Creatinine 0.83 07/19/2013    BUN/Creatinine Ratio 10 12/23/2017    BUN/Creatinine Ratio 8 12/22/2017    BUN/Creatinine Ratio 10 12/21/2017 BUN/Creatinine Ratio 9 07/21/2013    BUN/Creatinine Ratio 9 07/20/2013    BUN/Creatinine Ratio 11 07/19/2013    Glucose 84 12/23/2017    Glucose 67 12/22/2017    Glucose 77 12/21/2017    Glucose 89 07/21/2013    Glucose 93 07/20/2013    Glucose 93 07/19/2013    Calcium 9.3 12/23/2017    Calcium 9.2 12/22/2017    Calcium 8.7 12/21/2017    Calcium 8.7 07/21/2013    Calcium 8.5 07/20/2013    Calcium 8.3 (L) 07/19/2013     Liver panel:  Lab Results   Component Value Date  PT 18.1 (H) 09/25/2016    PT 11.7 05/21/2010    INR 1.59 09/25/2016    INR 1.1 05/21/2010    Albumin 5.0 12/20/2017    Albumin 4.6 12/09/2017    Albumin 4.6 11/30/2017    Albumin 3.9 07/18/2013    Albumin 4.4 07/14/2013    Albumin 3.8 06/07/2013    Total Protein 8.7 (H) 12/20/2017    Total Protein 7.8 12/09/2017    Total Protein 7.7 11/30/2017    Total Protein 6.5 (L) 07/18/2013    Total Protein 7.5 07/14/2013    Total Protein 6.6 06/07/2013    Total Bilirubin 0.9 12/20/2017    Total Bilirubin 0.5 12/09/2017    Total Bilirubin 0.6 11/30/2017    Total Bilirubin 0.9 07/18/2013    Total Bilirubin 1.0 07/14/2013    Total Bilirubin 0.9 06/07/2013    Bilirubin, Direct 0.30 09/12/2017    Bilirubin, Direct <0.10 10/23/2016    Bilirubin, Direct 0.20 10/05/2016    Bilirubin, Direct 0.4 06/07/2013    AST 33 12/20/2017    AST  12/20/2017      Comment:      Hemolyzed Specimen..      AST 36 12/09/2017    AST 26 07/18/2013    AST 27 07/14/2013    AST 30 06/07/2013    ALT 23 12/20/2017    ALT 27 12/09/2017    ALT 13 11/30/2017    ALT 28 07/18/2013    ALT 18 (L) 07/14/2013    ALT 17 (L) 06/07/2013    Alkaline Phosphatase 128 (H) 12/20/2017    Alkaline Phosphatase 99 12/09/2017    Alkaline Phosphatase 74 11/30/2017    Alkaline Phosphatase 87 07/18/2013    Alkaline Phosphatase 79 07/14/2013    Alkaline Phosphatase 61 06/07/2013     Anemia:  MCV   Date Value Ref Range Status   12/23/2017 93.0 80.0 - 100.0 fL Final   07/21/2013 99 80 - 100 fL Final     RDW   Date Value Ref Range Status   12/23/2017 13.2 12.0 - 15.0 % Final   07/21/2013 13.2 12.0 - 15.0 % Final     Ferritin   Date Value Ref Range Status   01/08/2013 92 27 - 377 ng/mL Final     Iron Saturation (%)   Date Value Ref Range Status   01/08/2013 33 20 - 50 % Final     TIBC   Date Value Ref Range Status   01/08/2013 309 252 - 479 ug/dL Final     Folate   Date Value Ref Range Status   10/24/2010 8.0 2.7 - 20.0 NG/ML Final     Vitamin B-12   Date Value Ref Range Status   05/09/2017 378 193 - 900 pg/ml Final   07/30/2011 392 193 - 900 PG/ML Final     Luminal etiologies:  IgA   Date Value Ref Range Status   01/15/2011 443 (H) 40 - 400 MG/DL Final     CRP   Date Value Ref Range Status   12/20/2017 <5.0 <10.0 mg/L Final   07/18/2013 0.7 0.0 - 1.0 mg/dL Final     Sed Rate   Date Value Ref Range Status   12/20/2017 10 0 - 15 mm/h Final   07/18/2013 6 0 - 15 mm/h Final     Cortisol   Date Value Ref Range Status   05/21/2010 6.9 MCG/DL Final     Comment:     :  Before 10AM: 4.5-22.7 ug/dl  After  5PM: 1.7-14.1 ug/dl     TSH   Date Value Ref Range Status   12/20/2017 0.902 0.600 - 3.300 uIU/mL Final   01/15/2011 0.51 (L) 0.60 - 3.30 MICROIU/ML Final     Free T4   Date Value Ref Range Status   01/15/2011 1.01 0.71 - 1.40 NG/DL Final     Liver etiologies:  Hep B S Ab   Date Value Ref Range Status   07/23/2012 Nonreactive  Final     Hepatitis C Ab   Date Value Ref Range Status   07/23/2012 Negative  Final     Comment:     Antibodies to HCV were NOT detected.  A nonreactive result  does not exclude the possibility of exposure to HCV.     ASSESSMENT AND PLAN:     Rodney Bender is a 48 y.o. man with history significant for ileal stricturing Crohn's disease s/p four ileocecal resections with a long history of adherence issues and fractured care across institutions who presents for routine follow-up.    Ileal stricturing Crohn's disease: now with formed stools once a day, no blood, gaining weight, seems symptomatically controlled. We will check labs to ensure no evidence of active disease or B12 deficiency. Recent colonoscopy showed good control with Rutgeerts i1 for a single aphthous lesion at the anastomosis, which may represent vascular features of anastomosis more than active Crohn's disease.  - CMP, CBC, B12, ferritin, crp    Chronic nausea: in the mornings, very consistent without remission, so I don't think cyclic vomiting syndrome from his dronabinol. It would be very ususual to develop upper GI Crohn's at this point. I am overall reassured he is gaining weight and would treat conservatively with compazine, since this has been working for him.  - Refilled compazine, ok to take 2 tablets if more effective    Health maintenance in Crohn's disease: has had pneumococcal immunizations, declines influenza vaccination.   - Declines hep a and b vaccine    Patient Instructions and Plan 1.   Return in about 6 months (around 09/04/2018). Schedule at the check-out desk or call [phone] to schedule follow-up.  2. Reach out to [place] at my e-mail or by calling Robin at the numbers below if you have any questions or problems in the interim.  3. If you do not receive a call to schedule any tests or visits with doctors we refer you to, please call the scheduling numbers below or reach out to my e-mail or Robin for help scheduling.  4. Continue stellara injections for your Crohn's disease.  5. We'll check basic labs for your Crohn's disease, but symptomatically it sounds like it is well controlled.  6. We'll continue Rodney Bender for appetite, congratulations on your weight gain.  7. We'll refill the Compazine for nausea. You can take two of these to see if it works better, but don't take more than four total in a day.  8. If the nausea persists or gets worse, or if you develop any new symptoms, please reach out to [place].    Return in about 6 months (around 09/04/2018).    Hunt Oris, MD  Gastroenterology and Hepatology Fellow (PGY-4) [email or web address] (patient's providers should feel free to reach out with any questions whatsoever)    The patient was seen and discussed with Dr. Anne Shutter, who agreed with the above assessment and plan.

## 2018-03-07 NOTE — Unmapped (Signed)
I called to inform patient of lab results.

## 2018-03-08 ENCOUNTER — Ambulatory Visit: Admit: 2018-03-08 | Discharge: 2018-03-09 | Payer: MEDICARE

## 2018-03-08 DIAGNOSIS — M5412 Radiculopathy, cervical region: Principal | ICD-10-CM

## 2018-03-10 NOTE — Unmapped (Signed)
I saw and evaluated the patient, participating in the key portions of the service.  I reviewed the resident???s note.  I agree with the resident???s findings and plan. Nyasia Baxley T Evangelyn Crouse, MD

## 2018-03-18 ENCOUNTER — Encounter: Payer: Self-pay | Admitting: Emergency Medicine

## 2018-03-18 ENCOUNTER — Observation Stay
Admission: EM | Admit: 2018-03-18 | Discharge: 2018-03-20 | Disposition: A | Discharge status: Home / Self Care | Payer: Medicare Other | Attending: Internal Medicine | Admitting: Internal Medicine

## 2018-03-18 ENCOUNTER — Other Ambulatory Visit: Payer: Self-pay

## 2018-03-18 DIAGNOSIS — I10 Essential (primary) hypertension: Secondary | ICD-10-CM | POA: Insufficient documentation

## 2018-03-18 DIAGNOSIS — Z79899 Other long term (current) drug therapy: Secondary | ICD-10-CM | POA: Diagnosis not present

## 2018-03-18 DIAGNOSIS — J09X2 Influenza due to identified novel influenza A virus with other respiratory manifestations: Secondary | ICD-10-CM | POA: Diagnosis not present

## 2018-03-18 DIAGNOSIS — E860 Dehydration: Secondary | ICD-10-CM | POA: Diagnosis not present

## 2018-03-18 DIAGNOSIS — E871 Hypo-osmolality and hyponatremia: Secondary | ICD-10-CM | POA: Insufficient documentation

## 2018-03-18 DIAGNOSIS — G894 Chronic pain syndrome: Secondary | ICD-10-CM | POA: Diagnosis not present

## 2018-03-18 DIAGNOSIS — I700 Atherosclerosis of aorta: Secondary | ICD-10-CM | POA: Insufficient documentation

## 2018-03-18 DIAGNOSIS — K5080 Crohn's disease of both small and large intestine without complications: Secondary | ICD-10-CM | POA: Diagnosis not present

## 2018-03-18 DIAGNOSIS — Z79891 Long term (current) use of opiate analgesic: Secondary | ICD-10-CM | POA: Insufficient documentation

## 2018-03-18 DIAGNOSIS — Z8711 Personal history of peptic ulcer disease: Secondary | ICD-10-CM | POA: Insufficient documentation

## 2018-03-18 DIAGNOSIS — Z87891 Personal history of nicotine dependence: Secondary | ICD-10-CM | POA: Diagnosis not present

## 2018-03-18 DIAGNOSIS — K5090 Crohn's disease, unspecified, without complications: Secondary | ICD-10-CM

## 2018-03-18 DIAGNOSIS — R5381 Other malaise: Secondary | ICD-10-CM | POA: Diagnosis present

## 2018-03-18 DIAGNOSIS — R5383 Other fatigue: Secondary | ICD-10-CM

## 2018-03-18 DIAGNOSIS — K219 Gastro-esophageal reflux disease without esophagitis: Secondary | ICD-10-CM | POA: Diagnosis not present

## 2018-03-18 LAB — CBC WITH DIFFERENTIAL/PLATELET
ABS IMMATURE GRANULOCYTES: 0.03 10*3/uL (ref 0.00–0.07)
BASOS PCT: 0 %
Basophils Absolute: 0 10*3/uL (ref 0.0–0.1)
Eosinophils Absolute: 0 10*3/uL (ref 0.0–0.5)
Eosinophils Relative: 0 %
HCT: 39.6 % (ref 39.0–52.0)
HEMOGLOBIN: 13.5 g/dL (ref 13.0–17.0)
Immature Granulocytes: 0 %
LYMPHS ABS: 2.4 10*3/uL (ref 0.7–4.0)
LYMPHS PCT: 24 %
MCH: 31.1 pg (ref 26.0–34.0)
MCHC: 34.1 g/dL (ref 30.0–36.0)
MCV: 91.2 fL (ref 80.0–100.0)
MONO ABS: 0.7 10*3/uL (ref 0.1–1.0)
MONOS PCT: 7 %
NEUTROS ABS: 6.9 10*3/uL (ref 1.7–7.7)
Neutrophils Relative %: 69 %
PLATELETS: 218 10*3/uL (ref 150–400)
RBC: 4.34 MIL/uL (ref 4.22–5.81)
RDW: 12 % (ref 11.5–15.5)
SMEAR REVIEW: NORMAL
WBC MORPHOLOGY: REACTIVE
WBC: 10.1 10*3/uL (ref 4.0–10.5)
nRBC: 0 % (ref 0.0–0.2)

## 2018-03-18 LAB — COMPREHENSIVE METABOLIC PANEL
ALBUMIN: 4.3 g/dL (ref 3.5–5.0)
ALK PHOS: 59 U/L (ref 38–126)
ALT: 19 U/L (ref 0–44)
ANION GAP: 15 (ref 5–15)
AST: 29 U/L (ref 15–41)
BUN: 16 mg/dL (ref 6–20)
CHLORIDE: 95 mmol/L — AB (ref 98–111)
CO2: 20 mmol/L — AB (ref 22–32)
Calcium: 8.8 mg/dL — ABNORMAL LOW (ref 8.9–10.3)
Creatinine, Ser: 0.87 mg/dL (ref 0.61–1.24)
GFR calc non Af Amer: >60 mL/min (ref >60)
GLUCOSE: 65 mg/dL — AB (ref 70–99)
Potassium: 4.1 mmol/L (ref 3.5–5.1)
Sodium: 130 mmol/L — ABNORMAL LOW (ref 135–145)
Total Bilirubin: 1.1 mg/dL (ref 0.3–1.2)
Total Protein: 7.9 g/dL (ref 6.5–8.1)

## 2018-03-18 LAB — LIPASE, BLOOD: Lipase: 40 U/L (ref 11–51)

## 2018-03-18 MED ORDER — ONDANSETRON HCL 4 MG/2ML IJ SOLN
4.0000 mg | Freq: Once | INTRAMUSCULAR | Status: AC
  Administered 2018-03-19: 4 mg via INTRAVENOUS
  Filled 2018-03-18: qty 2

## 2018-03-18 MED ORDER — SODIUM CHLORIDE 0.9 % IV BOLUS
1000.0000 mL | Freq: Once | INTRAVENOUS | Status: AC
  Administered 2018-03-19: 1000 mL via INTRAVENOUS

## 2018-03-18 MED ORDER — FENTANYL CITRATE (PF) 100 MCG/2ML IJ SOLN
50.0000 ug | Freq: Once | INTRAMUSCULAR | Status: AC
  Administered 2018-03-19: 50 ug via INTRAVENOUS
  Filled 2018-03-18: qty 2

## 2018-03-18 NOTE — ED Notes (Signed)
Pt approached the desk asking what was taking so long.  It was explained to the patient that the back is completely full and we are seeing patients as fast as we can.  Pt stated he is going to go home and call an ambulance.  I informed the patient that it was not a guarantee he will get a bed if he does that.  Pt walked away.

## 2018-03-18 NOTE — ED Provider Notes (Signed)
[facility] Emergency Department Provider Note _______________   First MD Initiated Contact with Patient 03/18/18 2324     (approximate)  I have reviewed the triage vital signs and the nursing notes.   HISTORY  Chief Complaint Emesis    HPI Benjamin Murillo is a 48 y.o. male with below list of chronic medical conditions including recently diagnosis of flu which the patient was prescribed Tamiflu on Wednesday presents to the emergency department presents to the emergency department with worsening cough generalized body aches nausea vomiting and diarrhea with 10 out of 10 generalized abdominal discomfort.   Past Medical History:  Diagnosis Date  . Cervical radiculopathy   . Chronic abdominal pain   . Chronic neck pain   . Cold   . Crohn disease ([place])   . GERD (gastroesophageal reflux disease)    no meds-2-3 times weekly  . Hand fracture, left   . Headache(784.0)   . Hypertension    no meds   . IBS (irritable bowel syndrome)   . Shortness of breath    "sometimes laying down; sometimes w/activity" (10/13/2012)    Patient Active Problem List   Diagnosis Date Noted  . Clostridium difficile enteritis 12/09/2017  . Crohn's disease of both small and large intestine with other complication ([place]) 78/29/5621  . Chronic pain syndrome 06/27/2016  . Long term current use of opiate analgesic 06/27/2016  . S/P excision of lipoma 02/07/2016  . Chronic abdominal pain 11/23/2015  . AP (abdominal pain)   . Crohn disease ([place])   . Crohn's colitis ([place]) 01/03/2014  . HNP (herniated nucleus pulposus), cervical 02/12/2013  . Tobacco abuse 10/14/2012  . Nonadherence to medical treatment 10/14/2012  . Acute abdominal pain 10/13/2012  . Crohn's ileitis ([place]) 06/06/2012  . Disorder of intestine 10/26/2010  . Disorder of stomach and duodenum 10/26/2010  . 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 Duodenal ulcer 05/22/2010  . Drug dependence ([place])  03/25/2009  . Mixed, or nondependent drug abuse, in remission ([place]) 03/25/2009  . Personal history of digestive disease 02/05/2008  . Nausea with vomiting 12/31/2004  . Diarrhea 01/18/2004    Past Surgical History:  Procedure Laterality Date  . ANTERIOR CERVICAL DECOMP/DISCECTOMY FUSION N/A 02/12/2013   Procedure: ANTERIOR CERVICAL DECOMPRESSION/DISCECTOMY FUSION 1 LEVEL;  Surgeon: Winfield Cunas, MD;  Location: [facility]URO ORS;  Service: Neurosurgery;  Laterality: N/A;  C67 anterior cervical decompression with fusion plating and bonegraft  . BOWEL RESECTION     x2  . COLON SURGERY     drains placed to drain cyst  . COLONOSCOPY    . COLONOSCOPY WITH PROPOFOL N/A 11/13/2015   Procedure: COLONOSCOPY WITH PROPOFOL;  Surgeon: Manya Silvas, MD;  Location[facility] ENDOSCOPY;  Service: Endoscopy;  Laterality: N/A;  . ESOPHAGOGASTRODUODENOSCOPY (EGD) WITH PROPOFOL N/A 11/13/2015   Procedure: ESOPHAGOGASTRODUODENOSCOPY (EGD) WITH PROPOFOL;  Surgeon: Manya Silvas, MD;  Location: [facility] ENDOSCOPY;  Service: Endoscopy;  Laterality: N/A;  . LIPOMA EXCISION Right 01/24/2016   Procedure: EXCISION LIPOMA;  Surgeon: Olean Ree, MD;  Location: [facility] ORS;  Service: General;  Laterality: Right;  right back lipoma excision, patient should be prone  . NEVUS EXCISION     left arm + hand    Prior to Admission medications   Medication Sig Start Date End Date Taking? Authorizing Provider  amlodipine-atorvastatin (CADUET) 10-10 MG tablet Take 1 tablet by mouth daily.    [provider]  dronabinol (MARINOL) 10 MG capsule Take 10  mg by mouth 2 (two) times daily before a meal.    [provider]  famotidine (PEPCID) 20 MG tablet Take 20 mg by mouth 2 (two) times daily.    [provider]  gabapentin (NEURONTIN) 100 MG capsule Take 1 capsule (100 mg total) by mouth at bedtime. 02/18/17 04/19/17  Vevelyn Francois, NP  gentamicin (GARAMYCIN) 0.3 % ophthalmic solution Place 1 drop into the  right eye every 4 (four) hours. 08/10/17   Sable Feil, PA-C  hyoscyamine (LEVSIN, ANASPAZ) 0.125 MG tablet Take 1 tablet by mouth as needed. 03/26/17 03/26/18  [provider]  IRON PO Take by mouth. 01/10/17   [provider]  magic mouthwash w/lidocaine SOLN Take 5 mLs by mouth 4 (four) times daily. 05/30/17   Sable Feil, PA-C  naloxone [facility]) nasal spray 4 mg/0.1 mL For respiratory depression from opioids 12/18/16   [name], MD  oxyCODONE-acetaminophen (PERCOCET) 10-325 MG tablet Take 1 tablet by mouth 3 (three) times daily for 30 days. 02/17/18 03/19/18  Vevelyn Francois, NP  oxyCODONE-acetaminophen (PERCOCET) 10-325 MG tablet Take 1 tablet by mouth 3 (three) times daily as needed for up to 30 days for pain. 03/19/18 04/18/18  Vevelyn Francois, NP    Allergies Vicodin [hydrocodone-acetaminophen]; Tramadol; and Morphine and related  Family History  Problem Relation Age of Onset  . Hyperlipidemia Mother   . Cancer Maternal Aunt   . Diabetes Paternal Aunt     Social History Social History   Tobacco Use  . Smoking status: Former Smoker    Packs/day: 0.10    Years: 22.00    Pack years: 2.20    Types: Cigarettes  . Smokeless tobacco: Never Used  . Tobacco comment: states is progress of stopping 06/27/16  Substance Use Topics  . Alcohol use: No    Alcohol/week: 0.0 standard drinks  . Drug use: Not Currently    Types: Marijuana    Review of Systems Constitutional: No fever/chills Eyes: No visual changes. ENT: No sore throat. Cardiovascular: Denies chest pain. Respiratory: Denies shortness of breath.  Positive for cough Gastrointestinal: Positive for abdominal pain nausea vomiting and diarrhea. Genitourinary: Negative for dysuria. Musculoskeletal: Negative for neck pain.  Negative for back pain. Integumentary: Negative for rash. Neurological: Negative for headaches, focal weakness or  numbness.   ____________________________________________   PHYSICAL EXAM:  VITAL SIGNS: ED Triage Vitals  Enc Vitals Group     BP 03/18/18 1921 137/69     Pulse Rate 03/18/18 1921 62     Resp 03/18/18 1921 18     Temp 03/18/18 1921 98.8 F (37.1 C)     Temp Source 03/18/18 1921 Oral     SpO2 03/18/18 1921 99 %     Weight 03/18/18 1920 63.5 kg (140 lb)     Height 03/18/18 1920 1.803 m (5' 11" )     Head Circumference --      Peak Flow --      Pain Score 03/18/18 1920 10     Pain Loc --      Pain Edu? --      Excl. in [place]? --     Constitutional: Alert and oriented. Well appearing and in no acute distress. Eyes: Conjunctivae are normal.  Mouth/Throat: Mucous membranes are dry. Oropharynx non-erythematous. Neck: No stridor.  Cardiovascular: Normal rate, regular rhythm. Good peripheral circulation. Grossly normal heart sounds. Respiratory: Normal respiratory effort.  No retractions. Lungs CTAB. Gastrointestinal: Generalized tenderness to palpation.. No distention.  Musculoskeletal: No lower extremity tenderness nor edema. No gross deformities of extremities. Neurologic:  Normal speech and language. No gross focal neurologic deficits are appreciated.  Skin:  Skin is warm, dry and intact. No rash noted. Psychiatric: Mood and affect are normal. Speech and behavior are normal.  ____________________________________________   LABS (all labs ordered are listed, but only abnormal results are displayed)  Labs Reviewed  COMPREHENSIVE METABOLIC PANEL - Abnormal; Notable for the following components:      Result Value   Sodium 130 (*)    Chloride 95 (*)    CO2 20 (*)    Glucose, Bld 65 (*)    Calcium 8.8 (*)    All other components within normal limits  URINALYSIS, COMPLETE (UACMP) WITH MICROSCOPIC - Abnormal; Notable for the following components:   Color, Urine STRAW (*)    APPearance CLEAR (*)    Specific Gravity, Urine 1.033 (*)    Hgb urine dipstick MODERATE (*)     Ketones, ur 20 (*)    All other components within normal limits  CBC WITH DIFFERENTIAL/PLATELET  LIPASE, BLOOD   ___________________  RADIOLOGY I, [place] N BROWN, personally viewed and evaluated these images (plain radiographs) as part of my medical decision making, as well as reviewing the written report by the radiologist.  ED MD interpretation: No active cardiopulmonary disease noted on chest x-ray.  Official radiology report(s): Dg Chest 2 View  Result Date: 03/19/2018 CLINICAL DATA:  Cough and fever EXAM: CHEST - 2 VIEW COMPARISON:  08/07/2015 FINDINGS: Postsurgical changes in the cervical spine. No acute airspace disease or effusion. Normal heart size. No pneumothorax. IMPRESSION: No active cardiopulmonary disease. Electronically Signed   By: Donavan Foil M.D.   On: 03/19/2018 00:18   Ct Abdomen Pelvis W Contrast  Result Date: 03/19/2018 CLINICAL DATA:  Abdominal pain history of Crohn's with bowel resection EXAM: CT ABDOMEN AND PELVIS WITH CONTRAST TECHNIQUE: Multidetector CT imaging of the abdomen and pelvis was performed using the standard protocol following bolus administration of intravenous contrast. CONTRAST:  190m OMNIPAQUE IOHEXOL 300 MG/ML  SOLN COMPARISON:  CT 03/08/2014 FINDINGS: Lower chest: Mild ground-glass density in the left lower lobe. Small focus of ground-glass density in the anterior right base. No pleural effusion. Normal heart size. Hepatobiliary: No focal liver abnormality is seen. No gallstones, gallbladder wall thickening, or biliary dilatation. Pancreas: Unremarkable. No pancreatic ductal dilatation or surrounding inflammatory changes. Spleen: Normal in size without focal abnormality. Adrenals/Urinary Tract: Adrenal glands are normal. Subcentimeter hypodensities in the kidneys too small to further characterize. No hydronephrosis. The bladder is normal Stomach/Bowel: Stomach is nonenlarged. No dilated small bowel. Postsurgical changes in the right lower quadrant.  Thickened appearing left lower quadrant small bowel loops. Vascular/Lymphatic: Nonaneurysmal aorta. Mild aortic atherosclerosis. No significantly enlarged lymph nodes Reproductive: Prostate is unremarkable. Other: Negative for free air or free fluid Musculoskeletal: No acute or significant osseous findings. IMPRESSION: 1. Thickened appearing left lower quadrant small bowel loops, suggesting active Crohn's disease. No evidence for obstruction. 2. Mild ground-glass density in the left lower lobe with smaller focus of ground-glass density in the right lung base, consistent with respiratory infection. Electronically Signed   By: KDonavan FoilM.D.   On: 03/19/2018 02:34    ____________________________________________    Procedures   ____________________________________________   INITIAL IMPRESSION / ASSESSMENT AND PLAN / ED COURSE  As part of my medical decision making, I reviewed the following data within the electronic MEDICAL RECORD NUMBER   48year old male presented with above-stated  history and physical exam with differential diagnosis including Crohn's exacerbation, pneumonia as well as side effect of Tamiflu.  Given generalized abdominal discomfort and current stressor which may have exacerbated patient's Crohn's disease CT scan performed which is consistent with this finding.  Patient given multiple doses of IV narcotic analgesic in the emergency department with continued discomfort and nausea.  CT scan also concerning for possible lower lobe respiratory infection raising the possibility of pneumonia as well.  Patient discussed with Dr. Marcille Blanco for hospital admission for further evaluation and management given uncontrolled pain despite multiple doses of IV analgesic ____________________________________________  FINAL CLINICAL IMPRESSION(S) / ED DIAGNOSES  Final diagnoses:  Exacerbation of Crohn's disease without complication ([place])  Influenza Respiratory infection    MEDICATIONS GIVEN  DURING THIS VISIT:  Medications  metoCLOPramide (REGLAN) injection 10 mg (has no administration in time range)  sodium chloride 0.9 % bolus 1,000 mL (1,000 mLs Intravenous New Bag/Given 03/19/18 0143)  sodium chloride 0.9 % bolus 1,000 mL (1,000 mLs Intravenous New Bag/Given 03/19/18 0140)  ondansetron (ZOFRAN) injection 4 mg (4 mg Intravenous Given 03/19/18 0041)  fentaNYL (SUBLIMAZE) injection 50 mcg (50 mcg Intravenous Given 03/19/18 0137)  iohexol (OMNIPAQUE) 300 MG/ML solution 100 mL (100 mLs Intravenous Contrast Given 03/19/18 0157)  ondansetron (ZOFRAN) injection 4 mg (4 mg Intravenous Given 03/19/18 0321)  methylPREDNISolone sodium succinate (SOLU-MEDROL) 125 mg/2 mL injection 125 mg (125 mg Intravenous Given 03/19/18 0321)  HYDROmorphone (DILAUDID) injection 0.5 mg (0.5 mg Intravenous Given 03/19/18 7639)     ED Discharge Orders    None       Note:  This document was prepared using Dragon voice recognition software and may include unintentional dictation errors.   Gregor Hams, MD 03/19/18 [phone]

## 2018-03-18 NOTE — ED Triage Notes (Signed)
Pt to triage via w/c with no distress noted, mask in place; Dx with flu last wk and rx tamiflu; now with c/o generalized body aches, N/V/D and lower abd pain

## 2018-03-18 NOTE — Unmapped (Signed)
[facility] Refill Coordination Note  Specialty Medication(s): Stelara 90mg /ml   Additional Medications shipped:      Rodney Bender, DOB: September 04, 1970  Phone: [phone] (home) , Alternate phone contact: N/A  Phone or address changes today?: No  All above HIPAA information was verified with patient.  Shipping Address: [address]  [place] [address]   Insurance changes? No    Completed refill call assessment today to schedule patient's medication shipment from the [facility] [phone]).      Confirmed the medication and dosage are correct and have not changed: Yes, regimen is correct and unchanged.    Confirmed patient started or stopped the following medications in the past month:  No, there are no changes reported at this time.    Are you tolerating your medication?:  Rodney Bender reports tolerating the medication.    ADHERENCE    (Below is required for Medicare Part B or Transplant patients only - per drug):   How many tablets were dispensed last month:  Patient currently has0   remaining.    Did you miss any doses in the past 4 weeks? No missed doses reported.    FINANCIAL/SHIPPING    Delivery Scheduled: Yes, Expected medication delivery date: [id number]     Medication will be delivered via Same Day Courier to the home address in [facility].    The patient will receive a drug information handout for each medication shipped and additional FDA Medication Guides as required.      Rodney Bender did not have any additional questions at this time.    We will follow up with patient monthly for standard refill processing and delivery.      Thank you,  Antonietta Barcelona   [facility] Specialty Technician

## 2018-03-18 NOTE — ED Notes (Signed)
Pt is AOx4, vss, he c/o cough and overall feeling bad. Pt was updated on the ED bed surge status. He denies any needs at this time.

## 2018-03-19 ENCOUNTER — Emergency Department: Payer: Medicare Other

## 2018-03-19 DIAGNOSIS — R5383 Other fatigue: Secondary | ICD-10-CM

## 2018-03-19 DIAGNOSIS — R5381 Other malaise: Secondary | ICD-10-CM | POA: Diagnosis present

## 2018-03-19 DIAGNOSIS — K5080 Crohn's disease of both small and large intestine without complications: Secondary | ICD-10-CM | POA: Diagnosis not present

## 2018-03-19 LAB — URINALYSIS, COMPLETE (UACMP) WITH MICROSCOPIC
Bacteria, UA: NONE SEEN
Bilirubin Urine: NEGATIVE
Glucose, UA: NEGATIVE mg/dL
Ketones, ur: 20 mg/dL — AB
Leukocytes,Ua: NEGATIVE
Nitrite: NEGATIVE
Protein, ur: NEGATIVE mg/dL
SPECIFIC GRAVITY, URINE: 1.033 — AB (ref 1.005–1.030)
SQUAMOUS EPITHELIAL / LPF: NONE SEEN (ref 0–5)
pH: 6 (ref 5.0–8.0)

## 2018-03-19 LAB — TSH: TSH: 0.667 u[IU]/mL (ref 0.350–4.500)

## 2018-03-19 LAB — PROCALCITONIN: Procalcitonin: <0.1 ng/mL

## 2018-03-19 LAB — INFLUENZA PANEL BY PCR (TYPE A & B)
Influenza A By PCR: POSITIVE — AB
Influenza B By PCR: NEGATIVE

## 2018-03-19 MED ORDER — SODIUM CHLORIDE 0.9 % IV SOLN
INTRAVENOUS | Status: DC
  Administered 2018-03-19: 08:00:00 via INTRAVENOUS

## 2018-03-19 MED ORDER — IOHEXOL 300 MG/ML  SOLN
100.0000 mL | Freq: Once | INTRAMUSCULAR | Status: AC | PRN
  Administered 2018-03-19: 100 mL via INTRAVENOUS

## 2018-03-19 MED ORDER — ACETAMINOPHEN 325 MG PO TABS
650.0000 mg | ORAL_TABLET | Freq: Four times a day (QID) | ORAL | Status: DC | PRN
  Filled 2018-03-19: qty 2

## 2018-03-19 MED ORDER — MORPHINE SULFATE (PF) 2 MG/ML IV SOLN
2.0000 mg | INTRAVENOUS | Status: DC | PRN
  Administered 2018-03-19: 23:00:00 2 mg via INTRAVENOUS
  Filled 2018-03-19: qty 1

## 2018-03-19 MED ORDER — AMOXICILLIN-POT CLAVULANATE 875-125 MG PO TABS
1.0000 | ORAL_TABLET | Freq: Once | ORAL | Status: AC
  Administered 2018-03-19: 1 via ORAL
  Filled 2018-03-19: qty 1

## 2018-03-19 MED ORDER — HYDROMORPHONE HCL 1 MG/ML IJ SOLN
0.5000 mg | Freq: Once | INTRAMUSCULAR | Status: AC
  Administered 2018-03-19: 0.5 mg via INTRAVENOUS
  Filled 2018-03-19: qty 1

## 2018-03-19 MED ORDER — PREDNISONE 20 MG PO TABS
40.0000 mg | ORAL_TABLET | Freq: Every day | ORAL | Status: DC
  Administered 2018-03-20: 40 mg via ORAL
  Filled 2018-03-19: qty 2

## 2018-03-19 MED ORDER — OXYCODONE-ACETAMINOPHEN 10-325 MG PO TABS: 1.0000 | ORAL_TABLET | Freq: Three times a day (TID) | ORAL | Status: DC

## 2018-03-19 MED ORDER — OXYCODONE-ACETAMINOPHEN 5-325 MG PO TABS
1.0000 | ORAL_TABLET | Freq: Three times a day (TID) | ORAL | Status: DC | PRN
  Administered 2018-03-19 – 2018-03-20 (×3): 1 via ORAL
  Filled 2018-03-19 (×3): qty 1

## 2018-03-19 MED ORDER — OXYCODONE HCL 5 MG PO TABS
5.0000 mg | ORAL_TABLET | Freq: Three times a day (TID) | ORAL | Status: DC | PRN
  Administered 2018-03-19 – 2018-03-20 (×3): 5 mg via ORAL
  Filled 2018-03-19 (×5): qty 1

## 2018-03-19 MED ORDER — METHYLPREDNISOLONE SODIUM SUCC 125 MG IJ SOLR
125.0000 mg | Freq: Once | INTRAMUSCULAR | Status: AC
  Administered 2018-03-19: 125 mg via INTRAVENOUS
  Filled 2018-03-19: qty 2

## 2018-03-19 MED ORDER — FAMOTIDINE 20 MG PO TABS
20.0000 mg | ORAL_TABLET | Freq: Two times a day (BID) | ORAL | Status: DC
  Administered 2018-03-19 – 2018-03-20 (×3): 20 mg via ORAL
  Filled 2018-03-19 (×3): qty 1

## 2018-03-19 MED ORDER — ONDANSETRON HCL 4 MG/2ML IJ SOLN: 4.0000 mg | Freq: Four times a day (QID) | INTRAMUSCULAR | Status: DC | PRN

## 2018-03-19 MED ORDER — HYDROMORPHONE HCL 1 MG/ML IJ SOLN
0.5000 mg | INTRAMUSCULAR | Status: DC | PRN
  Administered 2018-03-19: 0.5 mg via INTRAVENOUS
  Filled 2018-03-19: qty 1

## 2018-03-19 MED ORDER — ALPRAZOLAM 0.25 MG PO TABS
0.2500 mg | ORAL_TABLET | Freq: Three times a day (TID) | ORAL | Status: DC | PRN
  Administered 2018-03-19: 0.25 mg via ORAL
  Filled 2018-03-19: qty 1

## 2018-03-19 MED ORDER — METRONIDAZOLE 500 MG PO TABS
500.0000 mg | ORAL_TABLET | Freq: Once | ORAL | Status: AC
  Administered 2018-03-19: 500 mg via ORAL
  Filled 2018-03-19: qty 1

## 2018-03-19 MED ORDER — ONDANSETRON HCL 4 MG PO TABS
4.0000 mg | ORAL_TABLET | Freq: Four times a day (QID) | ORAL | Status: DC | PRN
  Filled 2018-03-19: qty 1

## 2018-03-19 MED ORDER — ACETAMINOPHEN 650 MG RE SUPP
650.0000 mg | Freq: Four times a day (QID) | RECTAL | Status: DC | PRN
  Filled 2018-03-19: qty 1

## 2018-03-19 MED ORDER — HYDROMORPHONE HCL 1 MG/ML IJ SOLN
0.5000 mg | INTRAMUSCULAR | Status: DC | PRN
  Administered 2018-03-19: 17:00:00 0.5 mg via INTRAVENOUS
  Filled 2018-03-19: qty 1

## 2018-03-19 MED ORDER — METOCLOPRAMIDE HCL 5 MG/ML IJ SOLN
10.0000 mg | Freq: Once | INTRAMUSCULAR | Status: AC
  Administered 2018-03-19: 10 mg via INTRAVENOUS
  Filled 2018-03-19: qty 2

## 2018-03-19 MED ORDER — HYOSCYAMINE SULFATE 0.125 MG PO TBDP
0.1250 mg | ORAL_TABLET | ORAL | Status: DC | PRN
  Filled 2018-03-19: qty 1

## 2018-03-19 MED ORDER — ACETAMINOPHEN 325 MG PO TABS
ORAL_TABLET | ORAL | Status: AC
  Filled 2018-03-19: qty 2

## 2018-03-19 MED ORDER — ONDANSETRON HCL 4 MG/2ML IJ SOLN
4.0000 mg | Freq: Once | INTRAMUSCULAR | Status: AC
  Administered 2018-03-19: 4 mg via INTRAVENOUS
  Filled 2018-03-19: qty 2

## 2018-03-19 NOTE — H&P (Addendum)
Benjamin Murillo is an 48 y.o. male.   Chief Complaint: Vomiting HPI: The patient with past medical history of Crohn's disease and hypertension presents to the emergency department complaining of nausea and vomiting.  Emesis is been nonbloody nonbilious.  The patient also has abdominal pain and diarrhea.  He has not seen blood in his stool.  Notably, the patient completed a course of Tamiflu earlier this week but does not feel better.  Concern for Crohn's flare prompted the emergency department staff to give the patient IV Solu-Medrol prior to calling the hospitalist service for further management.  Past Medical History:  Diagnosis Date  . Cervical radiculopathy   . Chronic abdominal pain   . Chronic neck pain   . Cold   . Crohn disease ([facility])   . GERD (gastroesophageal reflux disease)    no meds-2-3 times weekly  . Hand fracture, left   . Headache(784.0)   . Hypertension    no meds   . IBS (irritable bowel syndrome)   . Shortness of breath    "sometimes laying down; sometimes w/activity" (10/13/2012)    Past Surgical History:  Procedure Laterality Date  . ANTERIOR CERVICAL DECOMP/DISCECTOMY FUSION N/A 02/12/2013   Procedure: ANTERIOR CERVICAL DECOMPRESSION/DISCECTOMY FUSION 1 LEVEL;  Surgeon: Winfield Cunas, MD;  Location: [facility];  Service: Neurosurgery;  Laterality: N/A;  C67 anterior cervical decompression with fusion plating and bonegraft  . BOWEL RESECTION     x2  . COLON SURGERY     drains placed to drain cyst  . COLONOSCOPY    . COLONOSCOPY WITH PROPOFOL N/A 11/13/2015   Procedure: COLONOSCOPY WITH PROPOFOL;  Surgeon: Manya Silvas, MD;  Location: [facility] END[facility]COPY;  Service: Endoscopy;  Laterality: N/A;  . ESOPHAGOGASTRODUODENOSCOPY (EGD) WITH PROPOFOL N/A 11/13/2015   Procedure: ESOPHAGOGASTRODUODENOSCOPY (EGD) WITH PROPOFOL;  Surgeon: Manya Silvas, MD;  Location: [facility] ENDOSCOPY;  Service: Endoscopy;  Laterality: N/A;  . LIPOMA EXCISION Right 01/24/2016    Procedure: EXCISION LIPOMA;  Surgeon: Olean Ree, MD;  Location: [facility] ORS;  Service: General;  Laterality: Right;  right back lipoma excision, patient should be prone  . NEVUS EXCISION     left arm + hand    Family History  Problem Relation Age of Onset  . Hyperlipidemia Mother   . Cancer Maternal Aunt   . Diabetes Paternal Aunt    Social History:  reports that he has quit smoking. His smoking use included cigarettes. He has a 2.20 pack-year smoking history. He has never used smokeless tobacco. He reports previous drug use. Drug: Marijuana. He reports that he does not drink alcohol.  Allergies:  Allergies  Allergen Reactions  . Vicodin [Hydrocodone-Acetaminophen] Other (See Comments) and Anxiety    anxious  . Tramadol Other (See Comments)    Makes patient anxious and nervous.  . Morphine And Related Itching    High doses    Prior to Admission medications   Medication Sig Start Date End Date Taking? Authorizing Provider  amlodipine-atorvastatin (CADUET) 10-10 MG tablet Take 1 tablet by mouth daily.    [provider]  dronabinol (MARINOL) 10 MG capsule Take 10 mg by mouth 2 (two) times daily before a meal.    [provider]  famotidine (PEPCID) 20 MG tablet Take 20 mg by mouth 2 (two) times daily.    [provider]  gabapentin (NEURONTIN) 100 MG capsule Take 1 capsule (100 mg total) by mouth at bedtime. 02/18/17 04/19/17  Vevelyn Francois, NP  gentamicin (  GARAMYCIN) 0.3 % ophthalmic solution Place 1 drop into the right eye every 4 (four) hours. 08/10/17   Sable Feil, PA-C  hyoscyamine (LEVSIN, ANASPAZ) 0.125 MG tablet Take 1 tablet by mouth as needed. 03/26/17 03/26/18  [provider]  IRON PO Take by mouth. 01/10/17   [provider]  magic mouthwash w/lidocaine SOLN Take 5 mLs by mouth 4 (four) times daily. 05/30/17   Sable Feil, PA-C  naloxone [facility]) nasal spray 4 mg/0.1 mL For respiratory depression from opioids 12/18/16    [name], MD  oxyCODONE-acetaminophen (PERCOCET) 10-325 MG tablet Take 1 tablet by mouth 3 (three) times daily for 30 days. 02/17/18 03/19/18  Vevelyn Francois, NP  oxyCODONE-acetaminophen (PERCOCET) 10-325 MG tablet Take 1 tablet by mouth 3 (three) times daily as needed for up to 30 days for pain. 03/19/18 04/18/18  Vevelyn Francois, NP     Results for orders placed or performed during the hospital encounter of 03/18/18 (from the past 48 hour(s))  CBC with Differential     Status: None   Collection Time: 03/18/18  7:31 PM  Result Value Ref Range   WBC 10.1 4.0 - 10.5 K/uL   RBC 4.34 4.22 - 5.81 MIL/uL   Hemoglobin 13.5 13.0 - 17.0 g/dL   HCT 39.6 39.0 - 52.0 %   MCV 91.2 80.0 - 100.0 fL   MCH 31.1 26.0 - 34.0 pg   MCHC 34.1 30.0 - 36.0 g/dL   RDW 12.0 11.5 - 15.5 %   Platelets 218 150 - 400 K/uL   nRBC 0.0 0.0 - 0.2 %   Neutrophils Relative % 69 %   Neutro Abs 6.9 1.7 - 7.7 K/uL   Lymphocytes Relative 24 %   Lymphs Abs 2.4 0.7 - 4.0 K/uL   Monocytes Relative 7 %   Monocytes Absolute 0.7 0.1 - 1.0 K/uL   Eosinophils Relative 0 %   Eosinophils Absolute 0.0 0.0 - 0.5 K/uL   Basophils Relative 0 %   Basophils Absolute 0.0 0.0 - 0.1 K/uL   WBC Morphology >10% Reactive Benign Lymphoctyes     Comment: VACUOLATED NEUTROPHILS   RBC Morphology MORPHOLOGY UNREMARKABLE    Smear Review Normal platelet morphology    Immature Granulocytes 0 %   Abs Immature Granulocytes 0.03 0.00 - 0.07 K/uL    Comment: Performed at [facility], [address] [place], [place] [address]  Comprehensive metabolic panel     Status: Abnormal   Collection Time: 03/18/18  7:31 PM  Result Value Ref Range   Sodium 130 (L) 135 - 145 mmol/L   Potassium 4.1 3.5 - 5.1 mmol/L   Chloride 95 (L) 98 - 111 mmol/L   CO2 20 (L) 22 - 32 mmol/L   Glucose, Bld 65 (L) 70 - 99 mg/dL   BUN 16 6 - 20 mg/dL   Creatinine, Ser 0.87 0.61 - 1.24 mg/dL   Calcium 8.8 (L) 8.9 - 10.3 mg/dL   Total Protein 7.9 6.5 - 8.1  g/dL   Albumin 4.3 3.5 - 5.0 g/dL   AST 29 15 - 41 U/L   ALT 19 0 - 44 U/L   Alkaline Phosphatase 59 38 - 126 U/L   Total Bilirubin 1.1 0.3 - 1.2 mg/dL   GFR calc non Af Amer >60 >60 mL/min   GFR calc Af Amer >60 >60 mL/min   Anion gap 15 5 - 15    Comment: Performed at [facility], [address] [place], [place] [address]  Lipase, blood     Status: None   Collection Time: 03/18/18  7:31 PM  Result Value Ref Range   Lipase 40 11 - 51 U/L    Comment: Performed at [facility], [address] [place], [place] [address]  Urinalysis, Complete w Microscopic     Status: Abnormal   Collection Time: 03/18/18  7:31 PM  Result Value Ref Range   Color, Urine STRAW (A) YELLOW   APPearance CLEAR (A) CLEAR   Specific Gravity, Urine 1.033 (H) 1.005 - 1.030   pH 6.0 5.0 - 8.0   Glucose, UA NEGATIVE NEGATIVE mg/dL   Hgb urine dipstick MODERATE (A) NEGATIVE   Bilirubin Urine NEGATIVE NEGATIVE   Ketones, ur 20 (A) NEGATIVE mg/dL   Protein, ur NEGATIVE NEGATIVE mg/dL   Nitrite NEGATIVE NEGATIVE   Leukocytes,Ua NEGATIVE NEGATIVE   RBC / HPF 0-5 0 - 5 RBC/hpf   WBC, UA 0-5 0 - 5 WBC/hpf   Bacteria, UA NONE SEEN NONE SEEN   Squamous Epithelial / LPF NONE SEEN 0 - 5    Comment: Performed at [facility], [address], [place], [place] [address]  Influenza panel by PCR (type A & B)     Status: Abnormal   Collection Time: 03/19/18  5:40 AM  Result Value Ref Range   Influenza A By PCR POSITIVE (A) NEGATIVE   Influenza B By PCR NEGATIVE NEGATIVE    Comment: (NOTE) The Xpert Xpress Flu assay is intended as an aid in the diagnosis of  influenza and should not be used as a sole basis for treatment.  This  assay is FDA approved for nasopharyngeal swab specimens only. Nasal  washings and aspirates are unacceptable for Xpert Xpress Flu testing. Performed at [facility], [address] [place], [place] [address]    Dg Chest 2 View  Result Date:  03/19/2018 CLINICAL DATA:  Cough and fever EXAM: CHEST - 2 VIEW COMPARISON:  08/07/2015 FINDINGS: Postsurgical changes in the cervical spine. No acute airspace disease or effusion. Normal heart size. No pneumothorax. IMPRESSION: No active cardiopulmonary disease. Electronically Signed   By: Donavan Foil M.D.   On: 03/19/2018 00:18   Ct Abdomen Pelvis W Contrast  Result Date: 03/19/2018 CLINICAL DATA:  Abdominal pain history of Crohn's with bowel resection EXAM: CT ABDOMEN AND PELVIS WITH CONTRAST TECHNIQUE: Multidetector CT imaging of the abdomen and pelvis was performed using the standard protocol following bolus administration of intravenous contrast. CONTRAST:  149m OMNIPAQUE IOHEXOL 300 MG/ML  SOLN COMPARISON:  CT 03/08/2014 FINDINGS: Lower chest: Mild ground-glass density in the left lower lobe. Small focus of ground-glass density in the anterior right base. No pleural effusion. Normal heart size. Hepatobiliary: No focal liver abnormality is seen. No gallstones, gallbladder wall thickening, or biliary dilatation. Pancreas: Unremarkable. No pancreatic ductal dilatation or surrounding inflammatory changes. Spleen: Normal in size without focal abnormality. Adrenals/Urinary Tract: Adrenal glands are normal. Subcentimeter hypodensities in the kidneys too small to further characterize. No hydronephrosis. The bladder is normal Stomach/Bowel: Stomach is nonenlarged. No dilated small bowel. Postsurgical changes in the right lower quadrant. Thickened appearing left lower quadrant small bowel loops. Vascular/Lymphatic: Nonaneurysmal aorta. Mild aortic atherosclerosis. No significantly enlarged lymph nodes Reproductive: Prostate is unremarkable. Other: Negative for free air or free fluid Musculoskeletal: No acute or significant osseous findings. IMPRESSION: 1. Thickened appearing left lower quadrant small bowel loops, suggesting active Crohn's disease. No evidence for obstruction. 2. Mild ground-glass density in  the  left lower lobe with smaller focus of ground-glass density in the right lung base, consistent with respiratory infection. Electronically Signed   By: Donavan Foil M.D.   On: 03/19/2018 02:34    Review of Systems  Constitutional: Positive for malaise/fatigue. Negative for chills and fever.  HENT: Negative for sore throat and tinnitus.   Eyes: Negative for blurred vision and redness.  Respiratory: Negative for cough and shortness of breath.   Cardiovascular: Negative for chest pain, palpitations, orthopnea and PND.  Gastrointestinal: Positive for abdominal pain, diarrhea, nausea and vomiting.  Genitourinary: Negative for dysuria, frequency and urgency.  Musculoskeletal: Negative for joint pain and myalgias.  Skin: Negative for rash.       No lesions  Neurological: Negative for speech change, focal weakness and weakness.  Endo/Heme/Allergies: Does not bruise/bleed easily.       No temperature intolerance  Psychiatric/Behavioral: Negative for depression and suicidal ideas.    Blood pressure 126/80, pulse (!) 58, temperature 98.5 F (36.9 C), temperature source Oral, resp. rate 18, height 5' 11"  (1.803 m), weight 63.5 kg, SpO2 96 %. Physical Exam  Vitals reviewed. Constitutional: He is oriented to person, place, and time. He appears well-developed and well-nourished. No distress.  HENT:  Head: Normocephalic and atraumatic.  Mouth/Throat: Oropharynx is clear and moist.  Eyes: Pupils are equal, round, and reactive to light. Conjunctivae and EOM are normal.  Neck: Normal range of motion. Neck supple. No JVD present. No tracheal deviation present. No thyromegaly present.  Cardiovascular: Normal rate, regular rhythm and normal heart sounds. Exam reveals no gallop and no friction rub.  No murmur heard. Respiratory: Effort normal and breath sounds normal. No respiratory distress.  GI: Soft. Bowel sounds are normal. He exhibits no distension. There is no abdominal tenderness.  Genitourinary:     Genitourinary Comments: Deferred   Musculoskeletal: Normal range of motion.        General: No edema.  Lymphadenopathy:    He has no cervical adenopathy.  Neurological: He is alert and oriented to person, place, and time. No cranial nerve deficit.  Skin: Skin is warm and dry. No rash noted. No erythema.  Psychiatric: He has a normal mood and affect. His behavior is normal. Judgment and thought content normal.     Assessment/Plan This is a 48 year old male admitted for viral syndrome and abdominal pain. 1.  Influenza: The patient completed Tamiflu 4 days ago.  He still has post-viral malaise and weakness partly due to diarrhea and dehydration.  He is currently afebrile with no signs or symptoms of sepsis.  Also no signs of encephalopathy or pneumonia, however the patient did admit to some shortness of breath and continued bronchitic cough.  Monitor respiratory status and hydrate with intravenous fluid. 2.  Dehydration: Ketones in urine as well as generalized weakness.  Encourage p.o. intake.  Advance diet as tolerated.  The patient is also hyponatremic likely due to hypovolemia.  Hydrate with normal saline. 3.  Crohn's disease: not clear if the patient is having an exacerbation at this time or if his abdominal pain and diarrhea is secondary to recent viral illness (symptoms characteristic of influenza a this year).  He does not have blood in his stool but he does have diarrhea and abdominal pain.  He has been receiving Humira injections every 8 weeks.  Continue steroid taper if needed.  Hyoscyamine if needed for all spasms. 4.  Hypertension: Controlled; continue antihypertensive patient's per home regimen once pharmacy reconciliation complete. 5.  DVT  prophylaxis: SCDs 6.  GI prophylaxis: H2 blocker per home regimen The patient is a full code.  Time spent on admission orders and patient care approximately 45 minutes  Harrie Foreman, MD 03/19/2018, 7:40 AM

## 2018-03-19 NOTE — ED Notes (Signed)
IV team at bedside 

## 2018-03-19 NOTE — ED Notes (Signed)
ED TO INPATIENT HANDOFF REPORT  ED Nurse Name and Phone #: Kathlee Nations [phone]  S Name/Age/Gender Benjamin Murillo 48 y.o. male Room/Bed: ED36A/ED36A  Code Status   Code Status: Full Code  Home/SNF/Other Home Patient oriented to: self, place, time and situation Is this baseline? Yes   Triage Complete: Triage complete  Chief Complaint poss flu  Triage Note Pt to triage via w/c with no distress noted, mask in place; Dx with flu last wk and rx tamiflu; now with c/o generalized body aches, N/V/D and lower abd pain   Allergies Allergies  Allergen Reactions  . Vicodin [Hydrocodone-Acetaminophen] Other (See Comments) and Anxiety    anxious  . Tramadol Other (See Comments)    Makes patient anxious and nervous.  . Morphine And Related Itching    High doses    Level of Care/Admitting Diagnosis ED Disposition    ED Disposition Condition [facility] Area: [place] [[id number]]  Level of Care: Med-Surg [16]  Diagnosis: Malaise and fatigue [650354]  Admitting Physician: Harrie Foreman [[id number]]  Attending Physician: Harrie Foreman [phone]  PT Class (Do Not Modify): Observation [104]  PT Acc Code (Do Not Modify): Observation [10022]       B Medical/Surgery History Past Medical History:  Diagnosis Date  . Cervical radiculopathy   . Chronic abdominal pain   . Chronic neck pain   . Cold   . Crohn disease ([place])   . GERD (gastroesophageal reflux disease)    no meds-2-3 times weekly  . Hand fracture, left   . Headache(784.0)   . Hypertension    no meds   . IBS (irritable bowel syndrome)   . Shortness of breath    "sometimes laying down; sometimes w/activity" (10/13/2012)   Past Surgical History:  Procedure Laterality Date  . ANTERIOR CERVICAL DECOMP/DISCECTOMY FUSION N/A 02/12/2013   Procedure: ANTERIOR CERVICAL DECOMPRESSION/DISCECTOMY FUSION 1 LEVEL;  Surgeon: Winfield Cunas, MD;  Location: [facility];  Service: Neurosurgery;   Laterality: N/A;  C67 anterior cervical decompression with fusion plating and bonegraft  . BOWEL RESECTION     x2  . COLON SURGERY     drains placed to drain cyst  . COLONOSCOPY    . COLONOSCOPY WITH PROPOFOL N/A 11/13/2015   Procedure: COLONOSCOPY WITH PROPOFOL;  Surgeon: Manya Silvas, MD;  Location: [facility] ENDOSCOPY;  Service: Endoscopy;  Laterality: N/A;  . ESOPHAGOGASTRODUODENOSCOPY (EGD) WITH PROPOFOL N/A 11/13/2015   Procedure: ESOPHAGOGASTRODUODENOSCOPY (EGD) WITH PROPOFOL;  Surgeon: Manya Silvas, MD;  Location: [facility] ENDOSCOPY;  Service: Endoscopy;  Laterality: N/A;  . LIPOMA EXCISION Right 01/24/2016   Procedure: EXCISION LIPOMA;  Surgeon: Olean Ree, MD;  Location: [facility] ORS;  Service: General;  Laterality: Right;  right back lipoma excision, patient should be prone  . NEVUS EXCISION     left arm + hand     A IV Location/Drains/Wounds Patient Lines/Drains/Airways Status   Active Line/Drains/Airways    Name:   Placement date:   Placement time:   Site:   Days:   Peripheral IV 03/19/18 Left Hand   03/19/18    0134    Hand   less than 1   Incision (Closed) 01/24/16 Back   01/24/16    1418     785          Intake/Output Last 24 hours  Intake/Output Summary (Last 24 hours) at 03/19/2018 1118 Last data filed at 03/19/2018 0423 Gross per 24 hour  Intake 2000  ml  Output -  Net 2000 ml    Labs/Imaging Results for orders placed or performed during the hospital encounter of 03/18/18 (from the past 48 hour(s))  CBC with Differential     Status: None   Collection Time: 03/18/18  7:31 PM  Result Value Ref Range   WBC 10.1 4.0 - 10.5 K/uL   RBC 4.34 4.22 - 5.81 MIL/uL   Hemoglobin 13.5 13.0 - 17.0 g/dL   HCT 39.6 39.0 - 52.0 %   MCV 91.2 80.0 - 100.0 fL   MCH 31.1 26.0 - 34.0 pg   MCHC 34.1 30.0 - 36.0 g/dL   RDW 12.0 11.5 - 15.5 %   Platelets 218 150 - 400 K/uL   nRBC 0.0 0.0 - 0.2 %   Neutrophils Relative % 69 %   Neutro Abs 6.9 1.7 - 7.7 K/uL   Lymphocytes  Relative 24 %   Lymphs Abs 2.4 0.7 - 4.0 K/uL   Monocytes Relative 7 %   Monocytes Absolute 0.7 0.1 - 1.0 K/uL   Eosinophils Relative 0 %   Eosinophils Absolute 0.0 0.0 - 0.5 K/uL   Basophils Relative 0 %   Basophils Absolute 0.0 0.0 - 0.1 K/uL   WBC Morphology >10% Reactive Benign Lymphoctyes     Comment: VACUOLATED NEUTROPHILS   RBC Morphology MORPHOLOGY UNREMARKABLE    Smear Review Normal platelet morphology    Immature Granulocytes 0 %   Abs Immature Granulocytes 0.03 0.00 - 0.07 K/uL    Comment: Performed at [facility], [address] [place], [place] [address]  Comprehensive metabolic panel     Status: Abnormal   Collection Time: 03/18/18  7:31 PM  Result Value Ref Range   Sodium 130 (L) 135 - 145 mmol/L   Potassium 4.1 3.5 - 5.1 mmol/L   Chloride 95 (L) 98 - 111 mmol/L   CO2 20 (L) 22 - 32 mmol/L   Glucose, Bld 65 (L) 70 - 99 mg/dL   BUN 16 6 - 20 mg/dL   Creatinine, Ser 0.87 0.61 - 1.24 mg/dL   Calcium 8.8 (L) 8.9 - 10.3 mg/dL   Total Protein 7.9 6.5 - 8.1 g/dL   Albumin 4.3 3.5 - 5.0 g/dL   AST 29 15 - 41 U/L   ALT 19 0 - 44 U/L   Alkaline Phosphatase 59 38 - 126 U/L   Total Bilirubin 1.1 0.3 - 1.2 mg/dL   GFR calc non Af Amer >60 >60 mL/min   GFR calc Af Amer >60 >60 mL/min   Anion gap 15 5 - 15    Comment: Performed at [facility], [address] [place], [place] [address]  Lipase, blood     Status: None   Collection Time: 03/18/18  7:31 PM  Result Value Ref Range   Lipase 40 11 - 51 U/L    Comment: Performed at [facility], [address] [place], [place] [address]  Urinalysis, Complete w Microscopic     Status: Abnormal   Collection Time: 03/18/18  7:31 PM  Result Value Ref Range   Color, Urine STRAW (A) YELLOW   APPearance CLEAR (A) CLEAR   Specific Gravity, Urine 1.033 (H) 1.005 - 1.030   pH 6.0 5.0 - 8.0   Glucose, UA NEGATIVE NEGATIVE mg/dL   Hgb urine dipstick MODERATE (A) NEGATIVE   Bilirubin Urine NEGATIVE  NEGATIVE   Ketones, ur 20 (A) NEGATIVE mg/dL   Protein, ur NEGATIVE NEGATIVE mg/dL   Nitrite NEGATIVE  NEGATIVE   Leukocytes,Ua NEGATIVE NEGATIVE   RBC / HPF 0-5 0 - 5 RBC/hpf   WBC, UA 0-5 0 - 5 WBC/hpf   Bacteria, UA NONE SEEN NONE SEEN   Squamous Epithelial / LPF NONE SEEN 0 - 5    Comment: Performed at [facility], [address] [place], [place] [address]  TSH     Status: None   Collection Time: 03/18/18  7:31 PM  Result Value Ref Range   TSH 0.667 0.350 - 4.500 uIU/mL    Comment: Performed by a 3rd Generation assay with a functional sensitivity of <=0.01 uIU/mL. Performed at [facility], [address] [place], [place] [address]   Influenza panel by PCR (type A & B)     Status: Abnormal   Collection Time: 03/19/18  5:40 AM  Result Value Ref Range   Influenza A By PCR POSITIVE (A) NEGATIVE   Influenza B By PCR NEGATIVE NEGATIVE    Comment: (NOTE) The Xpert Xpress Flu assay is intended as an aid in the diagnosis of  influenza and should not be used as a sole basis for treatment.  This  assay is FDA approved for nasopharyngeal swab specimens only. Nasal  washings and aspirates are unacceptable for Xpert Xpress Flu testing. Performed at [facility], [address] [place], [place] [address]    Dg Chest 2 View  Result Date: 03/19/2018 CLINICAL DATA:  Cough and fever EXAM: CHEST - 2 VIEW COMPARISON:  08/07/2015 FINDINGS: Postsurgical changes in the cervical spine. No acute airspace disease or effusion. Normal heart size. No pneumothorax. IMPRESSION: No active cardiopulmonary disease. Electronically Signed   By: Donavan Foil M.D.   On: 03/19/2018 00:18   Ct Abdomen Pelvis W Contrast  Result Date: 03/19/2018 CLINICAL DATA:  Abdominal pain history of Crohn's with bowel resection EXAM: CT ABDOMEN AND PELVIS WITH CONTRAST TECHNIQUE: Multidetector CT imaging of the abdomen and pelvis was performed using the standard protocol following bolus  administration of intravenous contrast. CONTRAST:  147mL OMNIPAQUE IOHEXOL 300 MG/ML  SOLN COMPARISON:  CT 03/08/2014 FINDINGS: Lower chest: Mild ground-glass density in the left lower lobe. Small focus of ground-glass density in the anterior right base. No pleural effusion. Normal heart size. Hepatobiliary: No focal liver abnormality is seen. No gallstones, gallbladder wall thickening, or biliary dilatation. Pancreas: Unremarkable. No pancreatic ductal dilatation or surrounding inflammatory changes. Spleen: Normal in size without focal abnormality. Adrenals/Urinary Tract: Adrenal glands are normal. Subcentimeter hypodensities in the kidneys too small to further characterize. No hydronephrosis. The bladder is normal Stomach/Bowel: Stomach is nonenlarged. No dilated small bowel. Postsurgical changes in the right lower quadrant. Thickened appearing left lower quadrant small bowel loops. Vascular/Lymphatic: Nonaneurysmal aorta. Mild aortic atherosclerosis. No significantly enlarged lymph nodes Reproductive: Prostate is unremarkable. Other: Negative for free air or free fluid Musculoskeletal: No acute or significant osseous findings. IMPRESSION: 1. Thickened appearing left lower quadrant small bowel loops, suggesting active Crohn's disease. No evidence for obstruction. 2. Mild ground-glass density in the left lower lobe with smaller focus of ground-glass density in the right lung base, consistent with respiratory infection. Electronically Signed   By: Donavan Foil M.D.   On: 03/19/2018 02:34    Pending Labs Unresulted Labs (From admission, onward)   None      Vitals/Pain Today's Vitals   03/19/18 0337 03/19/18 0400 03/19/18 0757 03/19/18 0808  BP:  126/80 109/71   Pulse:  (!) 58 (!) 56   Resp:  18 16  Temp:      TempSrc:      SpO2:  96% 98%   Weight:      Height:      PainSc: 10-Worst pain ever  9  8     Isolation Precautions Droplet precaution  Medications Medications  hyoscyamine  (ANASPAZ) disintergrating tablet 0.125 mg (has no administration in time range)  famotidine (PEPCID) tablet 20 mg (20 mg Oral Given 03/19/18 0935)  0.9 %  sodium chloride infusion ( Intravenous New Bag/Given 03/19/18 0807)  acetaminophen (TYLENOL) tablet 650 mg (650 mg Oral Not Given 03/19/18 0737)    Or  acetaminophen (TYLENOL) suppository 650 mg ( Rectal See Alternative 03/19/18 0737)  ondansetron (ZOFRAN) tablet 4 mg (has no administration in time range)    Or  ondansetron (ZOFRAN) injection 4 mg (has no administration in time range)  oxyCODONE-acetaminophen (PERCOCET/ROXICET) 5-325 MG per tablet 1 tablet (1 tablet Oral Given 03/19/18 1110)    And  oxyCODONE (Oxy IR/ROXICODONE) immediate release tablet 5 mg (5 mg Oral Given 03/19/18 1115)  predniSONE (DELTASONE) tablet 40 mg (has no administration in time range)  sodium chloride 0.9 % bolus 1,000 mL (0 mLs Intravenous Stopped 03/19/18 0423)  sodium chloride 0.9 % bolus 1,000 mL (0 mLs Intravenous Stopped 03/19/18 0423)  ondansetron (ZOFRAN) injection 4 mg (4 mg Intravenous Given 03/19/18 0041)  fentaNYL (SUBLIMAZE) injection 50 mcg (50 mcg Intravenous Given 03/19/18 0137)  iohexol (OMNIPAQUE) 300 MG/ML solution 100 mL (100 mLs Intravenous Contrast Given 03/19/18 0157)  ondansetron (ZOFRAN) injection 4 mg (4 mg Intravenous Given 03/19/18 0321)  methylPREDNISolone sodium succinate (SOLU-MEDROL) 125 mg/2 mL injection 125 mg (125 mg Intravenous Given 03/19/18 0321)  HYDROmorphone (DILAUDID) injection 0.5 mg (0.5 mg Intravenous Given 03/19/18 0337)  metoCLOPramide (REGLAN) injection 10 mg (10 mg Intravenous Given 03/19/18 0423)  amoxicillin-clavulanate (AUGMENTIN) 875-125 MG per tablet 1 tablet (1 tablet Oral Given 03/19/18 0448)  metroNIDAZOLE (FLAGYL) tablet 500 mg (500 mg Oral Given 03/19/18 0448)    Mobility walks Low fall risk   Focused Assessments gi  History of chrons.  Chronic pain meds--takes percocet 3 times a day with pain clinic and I just  gave it to him.  Is flu positive on droplet isolatioon   R Recommendations: See Admitting Provider Note  Report given to:   Additional Notes:

## 2018-03-19 NOTE — ED Notes (Signed)
Patient complains of pain.  I offered tylenol, but he does not want that.  He want the medication he got earlier.

## 2018-03-19 NOTE — ED Notes (Signed)
Ate liquid breakfast.

## 2018-03-19 NOTE — ED Notes (Signed)
Patient transported to X-ray 

## 2018-03-19 NOTE — ED Notes (Signed)
Report to robin

## 2018-03-19 NOTE — Progress Notes (Signed)
Patient admitted early this morning.  Seen and examined by me later in the morning.  States that he is having continued abdominal pain.  Denies any hematochezia.  His nausea and vomiting have improved.  He states his main issue is that he just feels so "rundown and weak".  On exam, RRR.  Lungs clear to auscultation bilaterally.  Positive bowel sounds, soft, nondistended. + Diffuse tenderness to palpation, with slightly worse tenderness in the left lower quadrant.  No rebound or guarding.  -Reviewed CT abdomen pelvis, which showed possible respiratory infection of the lung base.  Likely secondary to influenza.  Procalcitonin negative, so will not treat for a bacterial pneumonia. -Received Solu-Medrol 125 mg in the ED, will continue prednisone -Pain control  [name], MD

## 2018-03-19 NOTE — ED Notes (Signed)
Requested ginger ale

## 2018-03-19 NOTE — ED Notes (Signed)
Voided 400 ml

## 2018-03-19 NOTE — ED Notes (Signed)
IV access attempted x 2 and unsuccessful, delegated to charge RN at this time.

## 2018-03-19 NOTE — ED Notes (Signed)
Patient transported to CT 

## 2018-03-20 DIAGNOSIS — K5080 Crohn's disease of both small and large intestine without complications: Secondary | ICD-10-CM | POA: Diagnosis not present

## 2018-03-20 MED ORDER — OXYCODONE-ACETAMINOPHEN 10-325 MG PO TABS: 1.0000 | ORAL_TABLET | Freq: Three times a day (TID) | ORAL | 0 refills | Status: AC

## 2018-03-20 MED ORDER — PREDNISONE 20 MG PO TABS: 40.0000 mg | ORAL_TABLET | Freq: Every day | ORAL | 0 refills | Status: DC

## 2018-03-20 MED ORDER — DIPHENHYDRAMINE HCL 25 MG PO CAPS
25.0000 mg | ORAL_CAPSULE | Freq: Every evening | ORAL | Status: DC | PRN
  Administered 2018-03-20: 50 mg via ORAL
  Filled 2018-03-20: qty 2

## 2018-03-20 NOTE — Care Management Obs Status (Signed)
[place] NOTIFICATION   Patient Details  Name: ZACKORY PUDLO MRN: [id number] Date of Birth: [birth year]   Medicare Observation Status Notification Given:  Yes; permission to sign given per patient    Shelbie Ammons, RN 03/20/2018, 9:50 AM

## 2018-03-20 NOTE — Discharge Instructions (Signed)
It was so nice to meet you during this hospitalization!  You came into the hospital with weakness, nausea, vomiting, and abdominal pain. We think you had a Crohn's flare.  I have prescribed the following medication: 1. Please take Prednisone 40mg  daily with breakfast starting tomorrow.  Please make sure you follow-up with your GI doctor in the next couple of days.  Take care, Dr. Brett Albino

## 2018-03-20 NOTE — Discharge Summary (Signed)
[facility] [place] NAME: Benjamin Murillo    MR#:  [id number]  DATE OF BIRTH:  1970-06-06  DATE OF ADMISSION:  03/18/2018   ADMITTING PHYSICIAN: Harrie Foreman, MD  DATE OF DISCHARGE: 03/20/18  PRIMARY CARE PHYSICIAN: Patient, No Pcp Per   ADMISSION DIAGNOSIS:  Exacerbation of Crohn's disease without complication ([place]) [L93.79] DISCHARGE DIAGNOSIS:  Active Problems:   Malaise and fatigue  SECONDARY DIAGNOSIS:   Past Medical History:  Diagnosis Date  . Cervical radiculopathy   . Chronic abdominal pain   . Chronic neck pain   . Cold   . Crohn disease ([place])   . GERD (gastroesophageal reflux disease)    no meds-2-3 times weekly  . Hand fracture, left   . Headache(784.0)   . Hypertension    no meds   . IBS (irritable bowel syndrome)   . Shortness of breath    "sometimes laying down; sometimes w/activity" (10/13/2012)   HOSPITAL COURSE:   Benjamin Murillo is a 48 year old male who presented to the ED with nausea, vomiting, and abdominal pain.  He also endorsed diarrhea.  He had been recently treated for influenza A with Tamiflu, which he finished a couple of days ago.  In the ED, CT abdomen pelvis showed thickened appearing left lower quadrant small bowel loops, suggestive of active Crohn's disease.  He was admitted for further management.  Crohn's flare- nausea, vomiting, and abdominal pain completely resolved on the day of discharge -Given Solu-Medrol 125 mg IV x1 in the ED, then transitioned to prednisone 40 mg daily until follows up with his outpatient GI doctor -Continue home pain meds and patient should follow-up in pain clinic  Recent influenza A- has already completed course of Tamiflu -CT abdomen pelvis showed a "respiratory infection in the left lower lobe" -Procalcitonin was negative, patient afebrile, no leukocytosis so bacterial pneumonia thought to be unlikely -Treated with supportive care  Dehydration-likely due to  above -Given IV fluids  Hypertension-well-controlled -Continued home blood pressure medicines  DISCHARGE CONDITIONS:  Crohn's disease Hypertension CONSULTS OBTAINED:  None DRUG ALLERGIES:   Allergies  Allergen Reactions  . Vicodin [Hydrocodone-Acetaminophen] Other (See Comments) and Anxiety    anxious  . Tramadol Other (See Comments)    Makes patient anxious and nervous.  . Morphine And Related Itching    High doses   DISCHARGE MEDICATIONS:   Allergies as of 03/20/2018      Reactions   Vicodin [hydrocodone-acetaminophen] Other (See Comments), Anxiety   anxious   Tramadol Other (See Comments)   Makes patient anxious and nervous.   Morphine And Related Itching   High doses      Medication List    TAKE these medications   amlodipine-atorvastatin 10-10 MG tablet Commonly known as:  CADUET Take 1 tablet by mouth daily.   dronabinol 10 MG capsule Commonly known as:  MARINOL Take 10 mg by mouth 2 (two) times daily before a meal.   famotidine 20 MG tablet Commonly known as:  PEPCID Take 20 mg by mouth 2 (two) times daily.   gabapentin 100 MG capsule Commonly known as:  NEURONTIN Take 1 capsule (100 mg total) by mouth at bedtime.   hyoscyamine 0.125 MG tablet Commonly known as:  LEVSIN, ANASPAZ Take 0.125 mg by mouth as needed.   IRON PO Take 325 mg by mouth daily.   naloxone 4 MG/0.1ML Liqd nasal spray kit Commonly known as:  NARCAN For respiratory depression from opioids   oxyCODONE-acetaminophen  10-325 MG tablet Commonly known as:  PERCOCET Take 1 tablet by mouth 3 (three) times daily for 5 days.   predniSONE 20 MG tablet Commonly known as:  DELTASONE Take 2 tablets (40 mg total) by mouth daily with breakfast.   STELARA 90 MG/ML Sosy injection Generic drug:  ustekinumab Inject 90 mg as directed.        DISCHARGE INSTRUCTIONS:  1.  Follow-up with PCP in 5 days 2.  Follow-up with GI in 1 week 3.  Take prednisone 40 mg daily until seen by  GI DIET:  Cardiac diet DISCHARGE CONDITION:  Stable ACTIVITY:  Activity as tolerated OXYGEN:  Home Oxygen: No.  Oxygen Delivery: room air DISCHARGE LOCATION:  home   If you experience worsening of your admission symptoms, develop shortness of breath, life threatening emergency, suicidal or homicidal thoughts you must seek medical attention immediately by calling 911 or calling your MD immediately  if symptoms less severe.  You Must read complete instructions/literature along with all the possible adverse reactions/side effects for all the Medicines you take and that have been prescribed to you. Take any new Medicines after you have completely understood and accpet all the possible adverse reactions/side effects.   Please note  You were cared for by a hospitalist during your hospital stay. If you have any questions about your discharge medications or the care you received while you were in the hospital after you are discharged, you can call the unit and asked to speak with the hospitalist on call if the hospitalist that took care of you is not available. Once you are discharged, your primary care physician will handle any further medical issues. Please note that NO REFILLS for any discharge medications will be authorized once you are discharged, as it is imperative that you return to your primary care physician (or establish a relationship with a primary care physician if you do not have one) for your aftercare needs so that they can reassess your need for medications and monitor your lab values.    On the day of Discharge:  VITAL SIGNS:  Blood pressure 115/77, pulse (!) 50, temperature 97.9 F (36.6 C), temperature source Oral, resp. rate 18, height '5\' 11"'$  (1.803 m), weight 59.1 kg, SpO2 98 %. PHYSICAL EXAMINATION:  GENERAL:  48 y.o.-year-old patient lying in the bed with no acute distress.  EYES: Pupils equal, round, reactive to light and accommodation. No scleral icterus. Extraocular  muscles intact.  HEENT: Head atraumatic, normocephalic. Oropharynx and nasopharynx clear.  NECK:  Supple, no jugular venous distention. No thyroid enlargement, no tenderness.  LUNGS: Normal breath sounds bilaterally, no wheezing, rales,rhonchi or crepitation. No use of accessory muscles of respiration.  CARDIOVASCULAR: RRR, S1, S2 normal. No murmurs, rubs, or gallops.  ABDOMEN: Soft, non-distended. Bowel sounds present. No organomegaly or mass. + Very mild tenderness to palpation of the left lower quadrant, no rebound or guarding. EXTREMITIES: No pedal edema, cyanosis, or clubbing.  NEUROLOGIC: Cranial nerves II through XII are intact. Muscle strength 5/5 in all extremities. Sensation intact. Gait not checked.  PSYCHIATRIC: The patient is alert and oriented x 3.  SKIN: No obvious rash, lesion, or ulcer.  DATA REVIEW:   CBC Recent Labs  Lab 03/18/18 1931  WBC 10.1  HGB 13.5  HCT 39.6  PLT 218    Chemistries  Recent Labs  Lab 03/18/18 1931  NA 130*  K 4.1  CL 95*  CO2 20*  GLUCOSE 65*  BUN 16  CREATININE 0.87  CALCIUM  8.8*  AST 29  ALT 19  ALKPHOS 59  BILITOT 1.1     Microbiology Results  Results for orders placed or performed during the hospital encounter of 05/30/17  Group A Strep by PCR     Status: None   Collection Time: 05/30/17  2:14 PM  Result Value Ref Range Status   Group A Strep by PCR NOT DETECTED NOT DETECTED Final    Comment: Performed at [facility], [address] [place], [place] [address]    RADIOLOGY:  No results found.   Management plans discussed with the patient, family and they are in agreement.  CODE STATUS: Full Code   TOTAL TIME TAKING CARE OF THIS PATIENT: 35 minutes.    Berna Spare Yuritza Paulhus M.D on 03/20/2018 at 9:13 AM  Between 7am to 6pm - Pager - [phone]  After 6pm go to [email or web address] - Proofreader  [facility] [place] [facility]  Office  [phone]  CC: Primary care physician; Patient, No Pcp  Per   Note: This dictation was prepared with Dragon dictation along with smaller phrase technology. Any transcriptional errors that result from this process are unintentional.

## 2018-03-20 NOTE — Progress Notes (Signed)
Patient refused am lab draw. Dr. Marcille Blanco, MD, notified.

## 2018-03-20 NOTE — Care Management Note (Signed)
Case Management Note  Patient Details  Name: BENAIAH BEHAN MRN: [id number] Date of Birth: 07/12/1970  Subjective/Objective: Admitted to [facility] under observation status with the diagnosis of Flu+. Lives with friend/spouse [place] [phone]). Sees Dr. Filbert Schilder at [facility]. Last seen this physician about a month ago. Prescriptions are filled at [facility] on [address],  Takes care of all basic activities of daily living himeself                   Action/Plan: Unsure about transportation home.   Expected Discharge Date:  03/20/18               Expected Discharge Plan:     In-House Referral:   yes  Discharge planning Services     Post Acute Care Choice:    Choice offered to:     DME Arranged:    DME Agency:     HH Arranged:    HH Agency:     Status of Service:     If discussed at [employer], dates discussed:    Additional Comments:  Shelbie Ammons, RN MSN CCM Care Management [phone] 03/20/2018, 10:03 AM

## 2018-03-26 MED FILL — STELARA 90 MG/ML SUBCUTANEOUS SYRINGE: SUBCUTANEOUS | 56 days supply | Qty: 1 | Fill #3

## 2018-03-26 MED FILL — STELARA 90 MG/ML SUBCUTANEOUS SYRINGE: 56 days supply | Qty: 1 | Fill #3 | Status: AC

## 2018-03-27 ENCOUNTER — Ambulatory Visit: Admit: 2018-03-27 | Discharge: 2018-03-28 | Payer: MEDICARE

## 2018-03-27 DIAGNOSIS — A049 Bacterial intestinal infection, unspecified: Secondary | ICD-10-CM

## 2018-03-27 DIAGNOSIS — J189 Pneumonia, unspecified organism: Principal | ICD-10-CM

## 2018-03-27 DIAGNOSIS — K50012 Crohn's disease of small intestine with intestinal obstruction: Secondary | ICD-10-CM

## 2018-03-27 MED ORDER — LEVOFLOXACIN 750 MG TABLET
ORAL_TABLET | Freq: Every day | ORAL | 0 refills | 0 days | Status: CP
Start: 2018-03-27 — End: 2018-04-06

## 2018-03-27 MED ORDER — BENZONATATE 100 MG CAPSULE
ORAL_CAPSULE | Freq: Three times a day (TID) | ORAL | 0 refills | 0.00000 days | Status: CP | PRN
Start: 2018-03-27 — End: 2018-04-06

## 2018-03-27 MED ORDER — PREDNISONE 10 MG TABLET
ORAL_TABLET | ORAL | 0 refills | 0 days | Status: CP
Start: 2018-03-27 — End: 2018-04-17

## 2018-03-27 NOTE — Unmapped (Signed)
1. Return in about 5 weeks (around 05/01/2018). Schedule at the check-out desk or call [phone] to schedule follow-up.  2. Reach out to [place] at my e-mail or by calling Robin at the numbers below if you have any questions or problems in the interim.  3. If you do not receive a call to schedule any tests or visits with doctors we refer you to, please call the scheduling numbers below or reach out to my e-mail or Robin for help scheduling.  4. We will check basic labs to make sure you are not dehydrated or have other consequences of possible infection.  5. We will check your stool for infections.  6. We will treat suspected pneumonia with levofloxacin 750 mg qd for ten days. We'll also get a chest x-ray.  7. Take tessalon perles three times a day for cough.  8. Take up to 2,000 mg Tylenol/acetaminophen (in addition to the Percocet) for body aches.  9. We will start to taper prednisone, 20 mg daily for a week, 10 mg daily for a week, 5 mg daily for a week, then stop.  10. Continue the Stellara on your normal schedule.    -------------------------------------------------------------------------------    If you have any questions or concerns please contact us as below:    My email is [email or web address]  For appointments or clinical questions, please call Robin at [phone].  For emergencies after hours call [phone] and ask for the GI medicine fellow on call.    For scheduling radiology appointments [phone].  For scheduling GI procedures (e.g,. Colonoscopy), please call [phone].    ==============================================

## 2018-03-28 NOTE — Unmapped (Signed)
GASTROENTEROLOGY CONSULTATION CLINIC VISIT    REFERRING PROVIDER: Ivery Quale, MD  [address]  [address]  [place], [place] [address]    PRIMARY CARE PROVIDER: Ivery Quale, MD    PATIENT PROFILE: Rodney Bender is a 48 y.o. man who is seen in consultation at the request of Dr. Glorianne Manchester for evaluation of Crohn's disease.    SUBJECTIVE:       HISTORY OF PRESENT ILLNESS:      Rodney Bender is a 48 y.o. man with history significant for ileal stricturing Crohn's disease s/p four ileocecal resections with a long history of adherence issues and fractured care across institutions who presents for routine follow-up.    Crohn's disease history:  1. Stricturing ileocolonic Crohn's disease diagnosed 1990  2. Status post ileocecal resection 1998 due to stricture, with segmental ileocolectomy 2009 due to severe recurrent stricturing Crohn's disease.  3. On Remicade for a short time in the past, however discontinued due to noncompliance and transportation problems.  4. Previous he followed by Dr. Gwenith Spitz here at [facility] but terminated due to repeated no-shows and medical noncompliance  5. Previously on 6-MP, however this medication was discontinued after patient had undetectable drug levels on a recent admission  6. Recalls being on Humira at one point, but unclear exactly when.  7. He had additional ileocolonic resection by [facility] 09/16/2016, doing clinically much better for about six months after that, started on Stelara getting injections on site to insure adherence.  8. Stricturing ileocolonic Crohn's disease diagnosed 1990  9. Status post ileocecal resection 1998 due to stricture, with segmental ileocolectomy 2009 due to severe recurrent stricturing Crohn's disease.  10. On Remicade for a short time in the past, however discontinued due to noncompliance and transportation problems.  11. Previous he followed by Dr. Gwenith Spitz here at [facility] but terminated due to repeated no-shows and medical noncompliance  12. Previously on 6-MP, however this medication was discontinued after patient had undetectable drug levels on a recent admission  13. Recalls being on Humira at one point, but unclear exactly when.  14. He had additional ileocolonic resection by [facility] 09/16/2016, doing clinically much better for about six months after that, started on Stelara getting injections on site to insure adherence.  15. Symptoms improved and gained weight after surgery, did very well for several months, then developed nausea and vomiting and recurrent chronic abdominal pain. Imaging and labs were reassuring against flare and colonoscopy was without active disease.     Interval History:  He had admission of two days at [facility] regional influenza positive, s/p tamiflu with presentation a week or so before, but just wasn't getting better. He had nausea, vomiting and worsening of chronic abdominal pain and diarrhea. The diarrhea is distinctly worse form daily formed to 4-5 times daily loose, but without blood. He has lost about 10 pounds during this period. He has a productive cough and subjective fevers and chills, and sweats. He feels generally ill and week.  The productive cough has a weird taste and he is mildly dyspneic.    REVIEW OF SYSTEMS:   The balance of 12 systems reviewed is negative except as noted in the history of present illness.    Past Medical History:   Diagnosis Date   ??? Anxiety    ??? Crohn's disease (CMS-HCC)     diagnosed in 1990   ??? GERD (gastroesophageal reflux disease)    ??? Hypertension 10/08/2016     Past Surgical  History:   Procedure Laterality Date   ??? COLON SURGERY     ??? PR COLONOSCOPY FLX DX W/COLLJ SPEC WHEN PFRMD Left 11/10/2012    Procedure: COLONOSCOPY, FLEXIBLE, PROXIMAL TO SPLENIC FLEXURE; DIAGNOSTIC, W/WO COLLECTION SPECIMEN BY BRUSH OR WASH;  Surgeon: Malcolm Metro, MD;  [facility];  Service: Gastroenterology   ??? PR COLONOSCOPY FLX DX W/COLLJ SPEC WHEN PFRMD N/A 07/21/2013 Procedure: COLONOSCOPY, FLEXIBLE, PROXIMAL TO SPLENIC FLEXURE; DIAGNOSTIC, W/WO COLLECTION SPECIMEN BY BRUSH OR WASH;  Surgeon: Gwen Pounds, MD;  Location: [facility];  Service: Gastroenterology   ??? PR COLONOSCOPY FLX DX W/COLLJ SPEC WHEN PFRMD N/A 08/09/2016    Procedure: COLONOSCOPY, FLEXIBLE, PROXIMAL TO SPLENIC FLEXURE; DIAGNOSTIC, W/WO COLLECTION SPECIMEN BY BRUSH OR WASH;  Surgeon: Janyth Pupa, MD;  Location: [facility];  Service: Gastroenterology   ??? PR COLONOSCOPY W/BIOPSY SINGLE/MULTIPLE  07/23/2012    Procedure: COLONOSCOPY, FLEXIBLE, PROXIMAL TO SPLENIC FLEXURE; WITH BIOPSY, SINGLE OR MULTIPLE;  Surgeon: Vickii Chafe, MD;  Location: [facility];  Service: Gastroenterology   ??? PR COLONOSCOPY W/BIOPSY SINGLE/MULTIPLE N/A 07/15/2014    Procedure: COLONOSCOPY, FLEXIBLE, PROXIMAL TO SPLENIC FLEXURE; WITH BIOPSY, SINGLE OR MULTIPLE;  Surgeon: Janyth Pupa, MD;  Location: [facility];  Service: Gastroenterology   ??? PR COLONOSCOPY W/BIOPSY SINGLE/MULTIPLE N/A 03/21/2017    Procedure: COLONOSCOPY, FLEXIBLE, PROXIMAL TO SPLENIC FLEXURE; WITH BIOPSY, SINGLE OR MULTIPLE;  Surgeon: Modena Nunnery, MD;  Location: GI PROCEDURES [facility] [facility];  Service: Gastroenterology   ??? PR COLSC FLEXIBLE W/TRANSENDOSCOPIC BALLOON DILAT N/A 07/15/2014    Procedure: COLONOSCOPY, FLEXIBLE; WITH DILATION BY BALLOON, 1 OR MORE STRICTURES;  Surgeon: Janyth Pupa, MD;  Location: [facility];  Service: Gastroenterology   ??? PR REMVL COLON & TERM ILEUM W/ILEOCOLOSTOMY N/A 09/16/2016    Procedure: COLECTOMY, PARTIAL, WITH REMOVAL OF TERMINAL ILEUM WITH ILEOCOLOSTOMY;  Surgeon: Lady Gary, MD;  Location: MAIN OR [place];  Service: Gastrointestinal   ??? PR UPPER GI ENDOSCOPY,BIOPSY N/A 07/23/2012    Procedure: UGI ENDOSCOPY; WITH BIOPSY, SINGLE OR MULTIPLE;  Surgeon: Vickii Chafe, MD;  Location: [facility];  Service: Gastroenterology   ??? PR UPPER GI ENDOSCOPY,DIAGNOSIS N/A 11/10/2012    Procedure: UGI ENDO, INCLUDE ESOPHAGUS, STOMACH, & DUODENUM &/OR JEJUNUM; DX W/WO COLLECTION SPECIMN, BY BRUSH OR WASH;  Surgeon: Malcolm Metro, MD;  Location: [facility];  Service: Gastroenterology   ??? PR UPPER GI ENDOSCOPY,DIAGNOSIS N/A 07/21/2013    Procedure: UGI ENDO, INCLUDE ESOPHAGUS, STOMACH, & DUODENUM &/OR JEJUNUM; DX W/WO COLLECTION SPECIMN, BY BRUSH OR WASH;  Surgeon: Gwen Pounds, MD;  Location: [facility];  Service: Gastroenterology   ??? PR UPPER GI ENDOSCOPY,DIAGNOSIS N/A 07/15/2014    Procedure: UGI ENDO, INCLUDE ESOPHAGUS, STOMACH, & DUODENUM &/OR JEJUNUM; DX W/WO COLLECTION SPECIMN, BY BRUSH OR WASH;  Surgeon: Janyth Pupa, MD;  Location: [facility];  Service: Gastroenterology   ??? SPINE SURGERY       Current Outpatient Medications   Medication Sig Dispense Refill   ??? busPIRone (BUSPAR) 10 MG tablet Take 10 mg by mouth Two (2) times a day. Takes as needed     ??? citalopram (CELEXA) 20 MG tablet Take 1 tablet (20 mg total) by mouth daily. (Patient taking differently: Take 20 mg by mouth daily. Takes as needed) 90 tablet 3   ??? dronabinol (MARINOL) 10 MG capsule TAKE 1 CAPSULE  BY MOUTH TWICE DAILY 30 MINUTES BEFORE A MEAL 60 each 0   ??? gabapentin (NEURONTIN) 300 MG capsule Take 300 mg by mouth Three (3) times a day. Takes as needed     ??? hydroxyzine (ATARAX) 25 MG tablet Take 1 tablet (25 mg total) by mouth every six (6) hours as needed. 90 capsule 3   ??? oxyCODONE-acetaminophen (PERCOCET) 10-325 mg per tablet Take 1 tablet by mouth every eight (8) hours as needed for pain.      ??? prochlorperazine (COMPAZINE) 5 MG tablet Take 1-2 tablets (5-10 mg total) by mouth every eight (8) hours as needed for nausea (vomiting). 60 tablet 5   ??? sildenafil (VIAGRA) 50 MG tablet Take 50 mg by mouth daily as needed for erectile dysfunction.     ??? ustekinumab (STELARA) 90 mg/mL Syrg syringe Inject 1 mL (90 mg total) under the skin every 8 weeks. 1 mL 5   ??? vancomycin (VANCOCIN) 250 MG capsule Take according to prescribed taper (this is the remaining quantity to supplement the fill on 11/26) 23 capsule 0   ??? benzonatate (TESSALON) 100 MG capsule Take 1 capsule (100 mg total) by mouth Three (3) times a day as needed for cough for up to 7 days. 21 capsule 0   ??? levoFLOXacin (LEVAQUIN) 750 MG tablet Take 1 tablet (750 mg total) by mouth daily for 7 days. 10 tablet 0   ??? 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 predniSONE (DELTASONE) 10 MG tablet Take 2 tablets (20 mg total) by mouth daily for 7 days, THEN 1 tablet (10 mg total) daily for 7 days, THEN 0.5 tablets (5 mg total) daily for 7 days. 25 tablet 0     No current facility-administered medications for this visit.      Allergies  Reviewed at 1:05 PM      Reactions Comment    Dilaudid [hydromorphone] Anxiety     Morphine Itching     Opioids - Morphine Analogues Itching     Tramadol Itching, Anxiety     Vicodin [hydrocodone-acetaminophen] Anxiety         Family History   Problem Relation Age of Onset   ??? Hyperlipidemia Father    ??? Cancer Father    ??? Cancer Maternal Aunt    ??? Stroke Mother    ??? No Known Problems Sister    ??? No Known Problems Brother    ??? No Known Problems Maternal Uncle    ??? No Known Problems Paternal Aunt    ??? No Known Problems Paternal Uncle    ??? No Known Problems Maternal Grandmother    ??? No Known Problems Maternal Grandfather    ??? No Known Problems Paternal Grandmother    ??? No Known Problems Paternal Grandfather    ??? Anesthesia problems Neg Hx    ??? Broken bones Neg Hx    ??? Clotting disorder Neg Hx    ??? Collagen disease Neg Hx    ??? Diabetes Neg Hx    ??? Dislocations Neg Hx    ??? Fibromyalgia Neg Hx    ??? Gout Neg Hx    ??? Hemophilia Neg Hx    ??? Osteoporosis Neg Hx    ??? Rheumatologic disease Neg Hx    ??? Scoliosis Neg Hx    ??? Severe sprains Neg Hx    ??? Sickle cell anemia Neg Hx    ??? Spinal Compression Fracture Neg Hx  Social History     Tobacco Use   ??? Smoking status: Former Smoker     Packs/day: 1.00     Years: 18.00     Pack years: 18.00     Types: Cigarettes     Start date: 08/27/2003     Last attempt to quit: 06/06/2017     Years since quitting: 0.8   ??? Smokeless tobacco: Never Used   ??? Tobacco comment: Pt smokes 1ppd, Pt is interested in tobacco cessation    Substance Use Topics   ??? Alcohol use: No   ??? Drug use: Not Currently     Types: Marijuana       OBJECTIVE:   VITAL SIGNS: BP 120/80  - Pulse 69  - Temp 36.3 ??C  - Resp 16  - Ht 180.3 cm (5' 11)  - Wt 59.6 kg (131 lb 4.8 oz)  - SpO2 98%  - BMI 18.31 kg/m??     Wt Readings from Last 6 Encounters:   03/27/18 59.6 kg (131 lb 4.8 oz)   03/06/18 63 kg (138 lb 14.4 oz)   02/27/18 63.8 kg (140 lb 11.2 oz)   01/13/18 63 kg (138 lb 12.8 oz)   12/30/17 58.5 kg (129 lb)   12/20/17 59.2 kg (130 lb 8 oz)       PHYSICAL EXAM:  General appearance: mild distress and chronically ill appearing.  Eyes: Anicteric sclera. No erythema.  ENT: No oral ulcers. Posterior oropharynx unremarkable.  Cardiovascular: RRR without murmurs, heaves, or thrills. No lower extremity edema.  Pulmonary: Normal work of breathing. Acyanotic.  Abdominal: soft, nontender, nondistended, no masses or organomegaly.  Musculoskeletal: No temporal wasting. Normal joints of the hand.  Skin: No jaundice. No rashes.  Neurologic: Alert, oriented, and appropriate.  Psychiatric: Appropriate.    GI PROCEDURES:  Esophagogastroduodenoscopy (EGD) - 03/21/2017 - The examined portion of the ileum was normal. - Patent end-to-side ileo-colonic anastomosis, characterized by healthy appearing mucosa except one single aphteous lesion. Rutgeerts i1. - One 2 mm polyp in the ascending colon, removed with a jumbo cold forceps. Resected and retrieved. - The examination was otherwise normal. Overall no significant recurrence of Crohn's disease.    Colonoscopy - 08/09/2016 - The perianal and digital rectal examinations were normal. There was evidence of a prior end-to-side ileo-colonic anastomosis in the ascending colon. This was non-patent and was characterized by erythema, friable mucosa, inflammation and ulceration. The anastomosis could not be traversed. The exam was otherwise without abnormality on direct and retroflexion views. The colonic mucosa was normal throughout.    EGD/Colonoscopy - 07/15/2014 - Normal esophagus. - Normal stomach. - Normal examined duodenum. Aphtha in the sigmoid colon and in the descending colon. - Patent end-to-end ileo-colonic anastomosis, characterized by congestion, edema, erythema, friable mucosa, a hemorrhagic appearance, inflammation and mild stenosis. Dilated. Biopsied. - Aphtha in the distal ileum. - The examination was otherwise normal. - Non-bleeding internal hemorrhoids.    EGD/Colonoscopy - 07/21/2013 - Normal esophagus. - Normal stomach. - Normal examined duodenum. - No specimens collected. Non-thrombosed internal hemorrhoids found on perianal exam. - The entire examined colon is normal. - Patent functional end-to-end ileo-colonic anastomosis, characterized by healthy appearing mucosa. Rutgeert's stage I0. - The examined portion of the ileum was normal for 15 cm. - Internal hemorrhoids.    EGD/Colonoscopy - 11/10/2012 - Normal esophagus. - Normal examined duodenum. - Hiatus hernia. Patent end-to-side ileo-colonic anastomosis. - Crohn's disease with ileitis of distal 4 cm of neoterminal ileum. - The entire examined  colon is normal.    EGD/Colonoscopy -  07/23/2012 - LA Grade B esophagitis.- Erythematous mucosa in the prepyloric region of the stomach. Biopsied. - Duodenitis. Biopsied. - Normal 2nd part of the duodenum. The entire examined colon is normal. - End-to-side ileo-colonic anastomosis with inflammation on the neoterminal side with stenosis of efferent and afferent limbs. This was not traversed. This was biopsied.    RADIOGRAPHIC STUDIES:  CT abdomen and pelvis with contrast - 03/13/2017 - Status post ileocecectomy with ileocolic anastomosis. No dilated or thick-walled loops of bowel. No bowel obstruction. No ascites. No acute intra-abdominal abnormality.    Ct Abdomen Pelvis W Iv Contrast Only - 08/08/2016 - Short segment of bowel wall thickening at the distal ileum just prior to the ileocolic anastomosis. This could represent a Crohn's flare as clinically questioned.     LABORATORY RESULTS:  CBC:  Lab Results   Component Value Date    WBC 7.3 03/06/2018    WBC 6.6 12/23/2017    WBC 7.8 12/22/2017    WBC 10.3 07/21/2013    WBC 10.4 07/20/2013    WBC 6.9 07/19/2013    Absolute Neutrophils 4.0 12/23/2017    Absolute Neutrophils 5.3 12/22/2017    Absolute Neutrophils 4.6 12/21/2017    Absolute Neutrophils 9.0 (H) 07/21/2013    Absolute Neutrophils 9.1 (H) 07/20/2013    Absolute Neutrophils 5.7 07/19/2013    HGB 12.8 (L) 03/06/2018    HGB 12.3 (L) 12/23/2017    HGB 12.2 (L) 12/22/2017    HGB 12.5 (L) 07/21/2013    HGB 11.8 (L) 07/20/2013    HGB 11.9 (L) 07/19/2013    Platelet 311 03/06/2018    Platelet 346 12/23/2017    Platelet 335 12/22/2017    Platelet 176 07/21/2013    Platelet 205 07/20/2013    Platelet 191 07/19/2013     BMP:  Lab Results   Component Value Date    Sodium 136 03/06/2018    Sodium 130 (L) 12/23/2017    Sodium 132 (L) 12/22/2017    Sodium 130 (L) 07/21/2013    Sodium 131 (L) 07/20/2013    Sodium 133 (L) 07/19/2013    Potassium 4.4 03/06/2018    Potassium 4.0 12/23/2017    Potassium 4.0 12/22/2017    Potassium 4.2 07/21/2013    Potassium 4.3 07/20/2013    Potassium 5.3 (H) 07/19/2013    Chloride 101 03/06/2018    Chloride 92 (L) 12/23/2017    Chloride 99 12/22/2017    Chloride 102 07/21/2013    Chloride 103 07/20/2013    Chloride 106 07/19/2013    CO2 25.0 03/06/2018    CO2 29.0 12/23/2017    CO2 21.0 (L) 12/22/2017    CO2 23 07/21/2013    CO2 25 07/20/2013    CO2 21 (L) 07/19/2013    BUN 10 03/06/2018    BUN 9 12/23/2017    BUN 7 12/22/2017    BUN 7 07/21/2013    BUN 7 07/20/2013    BUN 9 07/19/2013    Creatinine 1.02 03/06/2018    Creatinine 0.90 12/23/2017    Creatinine 0.86 12/22/2017    Creatinine 0.78 07/21/2013    Creatinine 0.76 07/20/2013    Creatinine 0.83 07/19/2013    BUN/Creatinine Ratio 10 03/06/2018    BUN/Creatinine Ratio 10 12/23/2017    BUN/Creatinine Ratio 8 12/22/2017    BUN/Creatinine Ratio 9 07/21/2013    BUN/Creatinine Ratio 9 07/20/2013    BUN/Creatinine Ratio 11 07/19/2013    Glucose 76 03/06/2018  Glucose 84 12/23/2017    Glucose 67 12/22/2017    Calcium 9.7 03/06/2018    Calcium 9.3 12/23/2017    Calcium 9.2 12/22/2017    Calcium 8.7 07/21/2013    Calcium 8.5 07/20/2013    Calcium 8.3 (L) 07/19/2013     Liver panel:  Lab Results   Component Value Date    PT 18.1 (H) 09/25/2016    PT 11.7 05/21/2010    INR 1.59 09/25/2016    INR 1.1 05/21/2010    Albumin 4.4 03/06/2018    Albumin 5.0 12/20/2017    Albumin 4.6 12/09/2017    Albumin 3.9 07/18/2013    Albumin 4.4 07/14/2013    Albumin 3.8 06/07/2013    Total Protein 7.8 03/06/2018    Total Protein 8.7 (H) 12/20/2017    Total Protein 7.8 12/09/2017    Total Protein 6.5 (L) 07/18/2013    Total Protein 7.5 07/14/2013    Total Protein 6.6 06/07/2013    Total Bilirubin 0.6 03/06/2018    Total Bilirubin 0.9 12/20/2017    Total Bilirubin 0.5 12/09/2017    Total Bilirubin 0.9 07/18/2013    Total Bilirubin 1.0 07/14/2013    Total Bilirubin 0.9 06/07/2013    Bilirubin, Direct 0.30 09/12/2017    Bilirubin, Direct <0.10 10/23/2016    Bilirubin, Direct 0.20 10/05/2016    Bilirubin, Direct 0.4 06/07/2013    AST 40 03/06/2018    AST 33 12/20/2017    AST  12/20/2017      Comment:      Hemolyzed Specimen..      AST 26 07/18/2013    AST 27 07/14/2013    AST 30 06/07/2013    ALT 14 03/06/2018    ALT 23 12/20/2017    ALT 27 12/09/2017    ALT 28 07/18/2013    ALT 18 (L) 07/14/2013    ALT 17 (L) 06/07/2013    Alkaline Phosphatase 91 03/06/2018    Alkaline Phosphatase 128 (H) 12/20/2017    Alkaline Phosphatase 99 12/09/2017    Alkaline Phosphatase 87 07/18/2013    Alkaline Phosphatase 79 07/14/2013    Alkaline Phosphatase 61 06/07/2013     Anemia:  MCV   Date Value Ref Range Status   03/06/2018 93.9 80.0 - 100.0 fL Final   07/21/2013 99 80 - 100 fL Final     RDW   Date Value Ref Range Status   03/06/2018 13.1 12.0 - 15.0 % Final   07/21/2013 13.2 12.0 - 15.0 % Final     Ferritin   Date Value Ref Range Status   03/06/2018 90.0 27.0 - 377.0 ng/mL Final   01/08/2013 92 27 - 377 ng/mL Final     Iron Saturation (%)   Date Value Ref Range Status   01/08/2013 33 20 - 50 % Final     TIBC   Date Value Ref Range Status   01/08/2013 309 252 - 479 ug/dL Final     Folate   Date Value Ref Range Status   10/24/2010 8.0 2.7 - 20.0 NG/ML Final     Vitamin B-12   Date Value Ref Range Status   03/06/2018 258 193 - 900 pg/ml Final   07/30/2011 392 193 - 900 PG/ML Final     Luminal etiologies:  IgA   Date Value Ref Range Status   01/15/2011 443 (H) 40 - 400 MG/DL Final     CRP   Date Value Ref Range Status   03/06/2018 <5.0 <10.0 mg/L Final  07/18/2013 0.7 0.0 - 1.0 mg/dL Final     Sed Rate   Date Value Ref Range Status   12/20/2017 10 0 - 15 mm/h Final   07/18/2013 6 0 - 15 mm/h Final     Cortisol   Date Value Ref Range Status   05/21/2010 6.9 MCG/DL Final     Comment:     :  Before 10AM: 4.5-22.7 ug/dl  After  5PM: 1.6-10.9 ug/dl     TSH   Date Value Ref Range Status   12/20/2017 0.902 0.600 - 3.300 uIU/mL Final   01/15/2011 0.51 (L) 0.60 - 3.30 MICROIU/ML Final     Free T4   Date Value Ref Range Status   01/15/2011 1.01 0.71 - 1.40 NG/DL Final     Liver etiologies:  Hep B S Ab   Date Value Ref Range Status   07/23/2012 Nonreactive  Final     Hepatitis C Ab   Date Value Ref Range Status   07/23/2012 Negative  Final     Comment:     Antibodies to HCV were NOT detected.  A nonreactive result  does not exclude the possibility of exposure to HCV.     ASSESSMENT AND PLAN:     Andrius Andrepont is a 48 y.o. man with history significant for ileal stricturing Crohn's disease s/p four ileocecal resections with a long history of adherence issues and fractured care across institutions who presents for routine follow-up.    Productive cough and subjective fevers:  With rhonchi in RLL, but negative chest x-ray. Immunosuppression can mask imaging appearance of pneumonia. Post-flu, he had PCR diagnosis three weeks prior, is high risk for pneumonia and he is on significant prednisone. I have a very high clinical suspicion for pneumonia and would nonetheless treat in this case, did the CXR to exclude empyema. We will get basic labs to exclude AKI, white count, and look for anemia suggestive of Crohn's disease.    Ileal stricturing Crohn's disease: some suggestion of activity given weight loss, bowel thickening on CT, and recurrent watery diarrhea. This could also represent an infectious cause and we will check stool for C Difficile and GI pathogen panel. This is a little acute to be reactivation of controlled Crohn's disease in such good control three weeks prior. He should have a colonoscopy soon, but he looks generally ill, and it may be wise to wait a few weeks before this so he can be at optimal safety for the procedure.     Health maintenance in Crohn's disease: has had pneumococcal immunizations, declines influenza vaccination.   - Declines hep a and b vaccine    Patient Instructions and Plan 1.   Return in about 5 weeks (around 05/01/2018). Schedule at the check-out desk or call [phone] to schedule follow-up.  2. Reach out to [place] at my e-mail or by calling Robin at the numbers below if you have any questions or problems in the interim.  3. If you do not receive a call to schedule any tests or visits with doctors we refer you to, please call the scheduling numbers below or reach out to my e-mail or Robin for help scheduling.  4. We will check basic labs to make sure you are not dehydrated or have other consequences of possible infection.  5. We will check your stool for infections.  6. We will treat suspected pneumonia with levofloxacin 750 mg qd for ten days. We'll also get a chest x-ray.  7. Take tessalon  perles three times a day for cough.  8. Take up to 2,000 mg Tylenol/acetaminophen (in addition to the Percocet) for body aches.  9. We will start to taper prednisone, 20 mg daily for a week, 10 mg daily for a week, 5 mg daily for a week, then stop.  10. Continue the Stellara on your normal schedule.    Return in about 5 weeks (around 05/01/2018).    Hunt Oris, MD  Gastroenterology and Hepatology Fellow (PGY-4)  [email or web address] (patient's providers should feel free to reach out with any questions whatsoever)    The patient was seen and discussed with Dr. Anne Shutter, who agreed with the above assessment and plan.    The patient was also discussed with Dr. Raphael Gibney.

## 2018-03-28 NOTE — Unmapped (Signed)
I spoke with Rodney Bender, he will start the levofloxacin today, still feeling about the same. They were unable to draw labs Friday, but he'll try again when he brings back stool samples. I sold him he should have a low threshold to reach out to [place] or present to our ED if he is not getting better.

## 2018-03-30 DIAGNOSIS — Z980 Intestinal bypass and anastomosis status: Principal | ICD-10-CM

## 2018-03-30 DIAGNOSIS — K5000 Crohn's disease of small intestine without complications: Principal | ICD-10-CM

## 2018-03-30 DIAGNOSIS — K219 Gastro-esophageal reflux disease without esophagitis: Principal | ICD-10-CM

## 2018-03-30 DIAGNOSIS — K523 Indeterminate colitis: Principal | ICD-10-CM

## 2018-03-30 NOTE — Unmapped (Signed)
I saw and evaluated the patient, participating in the key portions of the service.  I reviewed the resident???s note.  I agree with the resident???s findings and plan. Nyasia Baxley T Evangelyn Crouse, MD

## 2018-03-31 MED ORDER — DRONABINOL 10 MG CAPSULE
0 refills | 0 days | Status: CP
Start: 2018-03-31 — End: 2018-04-23

## 2018-04-03 NOTE — Unmapped (Signed)
I spoke with Mr. Alcorta, he is doing better with the antibiotics, has colonoscopy scheduled. We'll CTM, and he'll let me know if symptoms worsen.

## 2018-04-06 ENCOUNTER — Encounter: Admit: 2018-04-06 | Discharge: 2018-04-06 | Payer: MEDICARE | Attending: Anesthesiology | Primary: Anesthesiology

## 2018-04-06 ENCOUNTER — Ambulatory Visit: Admit: 2018-04-06 | Discharge: 2018-04-06 | Payer: MEDICARE

## 2018-04-06 DIAGNOSIS — F419 Anxiety disorder, unspecified: Principal | ICD-10-CM

## 2018-04-06 DIAGNOSIS — Z980 Intestinal bypass and anastomosis status: Principal | ICD-10-CM

## 2018-04-06 DIAGNOSIS — K5090 Crohn's disease, unspecified, without complications: Principal | ICD-10-CM

## 2018-04-06 DIAGNOSIS — I10 Essential (primary) hypertension: Principal | ICD-10-CM

## 2018-04-06 DIAGNOSIS — K219 Gastro-esophageal reflux disease without esophagitis: Principal | ICD-10-CM

## 2018-04-06 DIAGNOSIS — K523 Indeterminate colitis: Principal | ICD-10-CM

## 2018-04-06 DIAGNOSIS — K5000 Crohn's disease of small intestine without complications: Principal | ICD-10-CM

## 2018-04-09 DIAGNOSIS — I10 Essential (primary) hypertension: Principal | ICD-10-CM

## 2018-04-09 DIAGNOSIS — K5090 Crohn's disease, unspecified, without complications: Principal | ICD-10-CM

## 2018-04-09 DIAGNOSIS — K219 Gastro-esophageal reflux disease without esophagitis: Principal | ICD-10-CM

## 2018-04-09 DIAGNOSIS — F419 Anxiety disorder, unspecified: Principal | ICD-10-CM

## 2018-04-15 ENCOUNTER — Other Ambulatory Visit: Payer: Self-pay

## 2018-04-15 ENCOUNTER — Ambulatory Visit: Payer: Medicare Other | Attending: Nurse Practitioner | Admitting: Nurse Practitioner

## 2018-04-15 DIAGNOSIS — G8929 Other chronic pain: Secondary | ICD-10-CM | POA: Diagnosis not present

## 2018-04-15 DIAGNOSIS — R109 Unspecified abdominal pain: Secondary | ICD-10-CM | POA: Insufficient documentation

## 2018-04-15 DIAGNOSIS — M5020 Other cervical disc displacement, unspecified cervical region: Secondary | ICD-10-CM | POA: Insufficient documentation

## 2018-04-15 DIAGNOSIS — G894 Chronic pain syndrome: Secondary | ICD-10-CM | POA: Insufficient documentation

## 2018-04-15 MED ORDER — OXYCODONE-ACETAMINOPHEN 10-325 MG PO TABS: 1.0000 | ORAL_TABLET | Freq: Three times a day (TID) | ORAL | 0 refills | Status: DC | PRN

## 2018-04-15 NOTE — Progress Notes (Signed)
Patient's Name: Benjamin Murillo  MRN: [id number]  Referring Provider: Gennette Pac, [place]  DOB: Sep 25, 1970  PCP: Patient, No Pcp Per  DOS: 04/15/2018  Note by: Dionisio David, NP  Service setting: Ambulatory outpatient  Specialty: Interventional Pain Management  Location: [facility] (AMB) Pain Management Facility    Patient type: Established   HPI  Reason for Visit: Benjamin Murillo is a 48 y.o. year old, male patient, who comes today with a chief complaint of Abdominal Pain (crohns disease ) and Neck Pain (right s/p surgery rods and plates ) Last Appointment: His last appointment at our practice was on 02/17/2018. I last saw him on 02/17/2018.  Pain Assessment: Today, Benjamin Murillo describes the severity of the Chronic pain as a 7 /10. He indicates the location/referral of the pain to be Abdomen(neck ) Mid, Lower, Upper(right )/neck pain into shoulder and arm. Onset was: More than a month ago. The quality of pain is described as Tingling, Aching, Discomfort, Tiring, Other (Comment)(weakness of grip). Temporal description, or timing of pain is: Constant. Possible modifying factors: pain medication. Benjamin Murillo's  vitals were not taken for this visit. He denies any changes with his pain.  Denies any new concerns today.  He admits that he did have the flu recently and took a few more extra of his medication because of the overall pain.  Controlled Substance Pharmacotherapy Assessment REMS (Risk Evaluation and Mitigation Strategy)  Analgesic: Oxycodone/acetaminophen 10/325 mg up to 3 times daily MME/day: 45 mg/day.  Janett Billow, RN  04/15/2018 10:45 AM  Sign when Signing Visit Nursing Pain Medication Assessment:  Safety precautions to be maintained throughout the outpatient stay will include: orient to surroundings, keep bed in low position, maintain call bell within reach at all times, provide assistance with transfer out of bed and ambulation.  Medication Inspection Compliance: Pill count conducted  under aseptic conditions, in front of the patient. Neither the pills nor the bottle was removed from the patient's sight at any time. Once count was completed pills were immediately returned to the patient in their original bottle.  Medication: Oxycodone/APAP Pill/Patch Count: 0 of 75 pills remain Pill/Patch Appearance: Markings consistent with prescribed medication Bottle Appearance: Standard pharmacy container. Clearly labeled. Filled Date: 02 / 21 / 2020 Last Medication intake:  Today   Pharmacokinetics: Liberation and absorption (onset of action): WNL Distribution (time to peak effect): WNL Metabolism and excretion (duration of action): WNL         Pharmacodynamics: Desired effects: Analgesia: Benjamin Murillo reports >50% benefit. Functional ability: Patient reports that medication allows him to accomplish basic ADLs Clinically meaningful improvement in function (CMIF): Sustained CMIF goals met Perceived effectiveness: Described as relatively effective, allowing for increase in activities of daily living (ADL) Undesirable effects: Side-effects or Adverse reactions: None reported Monitoring: [place] PMP: Online review of the past 42-monthperiod conducted. Compliant with practice rules and regulations Last UDS on record: Summary  Date Value Ref Range Status  10/20/2017 FINAL  Final    Comment:    ==================================================================== TOXASSURE SELECT 13 (MW) ==================================================================== Test                             Result       Flag       Units Drug Present and Declared for Prescription Verification   Carboxy-THC                    >257  EXPECTED   ng/mg creat    Carboxy-THC is a metabolite of tetrahydrocannabinol  (THC).    Source of [facility] is most commonly illicit, but THC is also present    in a scheduled prescription medication.   Oxycodone                      515          EXPECTED   ng/mg creat    Oxymorphone                    416          EXPECTED   ng/mg creat   Noroxycodone                   1173         EXPECTED   ng/mg creat   Noroxymorphone                 225          EXPECTED   ng/mg creat    Sources of oxycodone are scheduled prescription medications.    Oxymorphone, noroxycodone, and noroxymorphone are expected    metabolites of oxycodone. Oxymorphone is also available as a    scheduled prescription medication. ==================================================================== Test                      Result    Flag   Units      Ref Range   Creatinine              389              mg/dL      >=20 ==================================================================== Declared Medications:  The flagging and interpretation on this report are based on the  following declared medications.  Unexpected results may arise from  inaccuracies in the declared medications.  **Note: The testing scope of this panel includes these medications:  Dronabinol (Marinol)  Oxycodone (Percocet)  **Note: The testing scope of this panel does not include following  reported medications:  Acetaminophen (Percocet)  Amlodipine (Caduet)  Atorvastatin (Caduet)  Eye Drops  Famotidine (Pepcid)  Gabapentin (Neurontin)  Hyoscyamine (Levsin)  Iron  Mouthwash  Naloxone (Narcan) ==================================================================== For clinical consultation, please call [phone]. ====================================================================    UDS interpretation: Compliant          Medication Assessment Form: Reviewed. Patient indicates being compliant with therapy Treatment compliance: Compliant Risk Assessment Profile: Aberrant behavior: See initial evaluations. None observed or detected today Comorbid factors increasing risk of overdose: See initial evaluation. No additional risks detected today Opioid risk tool (ORT):  Opioid Risk  02/17/2018  Alcohol 0  Illegal  Drugs 0  Rx Drugs 0  Alcohol 0  Illegal Drugs 4  Rx Drugs 0  Age between 16-45 years  1  History of Preadolescent Sexual Abuse -  Psychological Disease 0  ADD -  OCD -  Bipolar -  Depression 0  Opioid Risk Tool Scoring 5  Opioid Risk Interpretation Moderate Risk    ORT Scoring interpretation table:  Score <3 = Low Risk for SUD  Score between 4-7 = Moderate Risk for SUD  Score >8 = High Risk for Opioid Abuse   Risk of substance use disorder (SUD): Moderate  Risk Mitigation Strategies:  Patient Counseling: Covered Patient-Prescriber Agreement (PPA): Present and active  Notification to other healthcare providers: Done  Pharmacologic Plan: No change in therapy, at this time.  ROS  Constitutional: Denies any fever or chills Gastrointestinal: No reported hemesis, hematochezia, vomiting, or acute GI distress Musculoskeletal: Denies any acute onset joint swelling, redness, loss of ROM, or weakness Neurological: No reported episodes of acute onset apraxia, aphasia, dysarthria, agnosia, amnesia, paralysis, loss of coordination, or loss of consciousness  Medication Review  amlodipine-atorvastatin, dronabinol, famotidine, gabapentin, hyoscyamine, naloxone, oxyCODONE-acetaminophen, prochlorperazine, and ustekinumab  History Review  Allergy: Benjamin Murillo is allergic to vicodin [hydrocodone-acetaminophen]; tramadol; and morphine and related. Drug: Benjamin Murillo  reports previous drug use. Drug: Marijuana. Alcohol:  reports no history of alcohol use. Tobacco:  reports that he has quit smoking. His smoking use included cigarettes. He has a 2.20 pack-year smoking history. He has never used smokeless tobacco. Social: Benjamin Murillo  reports that he has quit smoking. His smoking use included cigarettes. He has a 2.20 pack-year smoking history. He has never used smokeless tobacco. He reports previous drug use. Drug: Marijuana. He reports that he does not drink alcohol. Medical:  has a past  medical history of Cervical radiculopathy, Chronic abdominal pain, Chronic neck pain, Cold, Crohn disease ([place]), GERD (gastroesophageal reflux disease), Hand fracture, left, Headache(784.0), Hypertension, IBS (irritable bowel syndrome), and Shortness of breath. Surgical: Benjamin Murillo  has a past surgical history that includes Bowel resection; Nevus excision; Anterior cervical decomp/discectomy fusion (N/A, 02/12/2013); Colonoscopy; Colonoscopy with propofol (N/A, 11/13/2015); Esophagogastroduodenoscopy (egd) with propofol (N/A, 11/13/2015); Colon surgery; and Lipoma excision (Right, 01/24/2016). Family: family history includes Cancer in his maternal aunt; Diabetes in his paternal aunt; Hyperlipidemia in his mother. Problem List: Benjamin Murillo has Chronic abdominal pain; HNP (herniated nucleus pulposus), cervical; and Chronic pain syndrome on their pertinent problem list.  Lab Review  Kidney Function Lab Results  Component Value Date   BUN 16 03/18/2018   CREATININE 0.87 03/18/2018   GFRAA >60 03/18/2018   GFRNONAA >60 03/18/2018  Liver Function Lab Results  Component Value Date   AST 29 03/18/2018   ALT 19 03/18/2018   ALBUMIN 4.3 03/18/2018  Note: Above Lab results reviewed.  Imaging Review  CT ABDOMEN PELVIS W CONTRAST CLINICAL DATA:  Abdominal pain history of Crohn's with bowel resection  EXAM: CT ABDOMEN AND PELVIS WITH CONTRAST  TECHNIQUE: Multidetector CT imaging of the abdomen and pelvis was performed using the standard protocol following bolus administration of intravenous contrast.  CONTRAST:  173m OMNIPAQUE IOHEXOL 300 MG/ML  SOLN  COMPARISON:  CT 03/08/2014  FINDINGS: Lower chest: Mild ground-glass density in the left lower lobe. Small focus of ground-glass density in the anterior right base. No pleural effusion. Normal heart size.  Hepatobiliary: No focal liver abnormality is seen. No gallstones, gallbladder wall thickening, or biliary dilatation.  Pancreas:  Unremarkable. No pancreatic ductal dilatation or surrounding inflammatory changes.  Spleen: Normal in size without focal abnormality.  Adrenals/Urinary Tract: Adrenal glands are normal. Subcentimeter hypodensities in the kidneys too small to further characterize. No hydronephrosis. The bladder is normal  Stomach/Bowel: Stomach is nonenlarged. No dilated small bowel. Postsurgical changes in the right lower quadrant. Thickened appearing left lower quadrant small bowel loops.  Vascular/Lymphatic: Nonaneurysmal aorta. Mild aortic atherosclerosis. No significantly enlarged lymph nodes  Reproductive: Prostate is unremarkable.  Other: Negative for free air or free fluid  Musculoskeletal: No acute or significant osseous findings.  IMPRESSION: 1. Thickened appearing left lower quadrant small bowel loops, suggesting active Crohn's disease. No evidence for obstruction. 2. Mild ground-glass density in the left lower lobe with smaller focus of ground-glass density in the right lung base, consistent with  respiratory infection.  Electronically Signed   By: Donavan Foil M.D.   On: 03/19/2018 02:34 DG Chest 2 View CLINICAL DATA:  Cough and fever  EXAM: CHEST - 2 VIEW  COMPARISON:  08/07/2015  FINDINGS: Postsurgical changes in the cervical spine. No acute airspace disease or effusion. Normal heart size. No pneumothorax.  IMPRESSION: No active cardiopulmonary disease.  Electronically Signed   By: Donavan Foil M.D.   On: 03/19/2018 00:18 Note: Reviewed        Physical Exam  General appearance: Well nourished, well developed, and well hydrated. In no apparent acute distress Mental status: Alert, oriented x 3 (person, place, & time)       Respiratory: No evidence of acute respiratory distress Eyes: PERLA Vitals: There were no vitals taken for this visit. BMI: Estimated body mass index is 18.19 kg/m as calculated from the following:   Height as of 03/19/18: 5' 11"  (1.803 m).    Weight as of 03/20/18: 130 lb 6.4 oz (59.1 kg). Ideal: Patient weight not recorded  Assessment   Status Diagnosis  Controlled Controlled Controlled 1. Chronic abdominal pain   2. Chronic pain syndrome   3. HNP (herniated nucleus pulposus), cervical      Updated Problems: Problem  Chronic Nausea    Plan of Care  Pharmacotherapy (Medications Ordered): Meds ordered this encounter  Medications  . oxyCODONE-acetaminophen (PERCOCET) 10-325 MG tablet    Sig: Take 1 tablet by mouth 3 (three) times daily as needed for up to 30 days for pain.    Dispense:  75 tablet    Refill:  0    Do not place this medication, or any other prescription from our practice, on "Automatic Refill". Patient may have prescription filled one day early if pharmacy is closed on scheduled refill date.    Order Specific Question:   Supervising Provider    Answer:   Milinda Pointer [phone]  . oxyCODONE-acetaminophen (PERCOCET) 10-325 MG tablet    Sig: Take 1 tablet by mouth 3 (three) times daily as needed for up to 30 days for pain.    Dispense:  75 tablet    Refill:  0    Do not place this medication, or any other prescription from our practice, on "Automatic Refill". Patient may have prescription filled one day early if pharmacy is closed on scheduled refill date.    Order Specific Question:   Supervising Provider    Answer:   Milinda Pointer [[id number]]   Administered today: Grace Bushy. [place] had no medications administered during this visit.  Orders:  No orders of the defined types were placed in this encounter.  Follow-up plan:   No follow-ups on file. Not at this time.    Note by: Dionisio David, NP Date: 04/15/2018; Time: 3:26 PM

## 2018-04-15 NOTE — Progress Notes (Signed)
Nursing Pain Medication Assessment:  Safety precautions to be maintained throughout the outpatient stay will include: orient to surroundings, keep bed in low position, maintain call bell within reach at all times, provide assistance with transfer out of bed and ambulation.  Medication Inspection Compliance: Pill count conducted under aseptic conditions, in front of the patient. Neither the pills nor the bottle was removed from the patient's sight at any time. Once count was completed pills were immediately returned to the patient in their original bottle.  Medication: Oxycodone/APAP Pill/Patch Count: 0 of 75 pills remain Pill/Patch Appearance: Markings consistent with prescribed medication Bottle Appearance: Standard pharmacy container. Clearly labeled. Filled Date: 02 / 21 / 2020 Last Medication intake:  Today

## 2018-04-20 ENCOUNTER — Institutional Professional Consult (permissible substitution)
Admit: 2018-04-20 | Discharge: 2018-04-21 | Payer: MEDICARE | Attending: Physical Medicine & Rehabilitation | Primary: Physical Medicine & Rehabilitation

## 2018-04-20 DIAGNOSIS — M5412 Radiculopathy, cervical region: Principal | ICD-10-CM

## 2018-04-20 NOTE — Unmapped (Addendum)
DIAGNOSIS: Right Cervical Radiculopathy. Previous cervical fusion ~ 2015 at [facility]. [place] right arm pain and weakness (patient notes symptoms ongoing or 2 years). Prior exam consistent with right arm weakness. No other red flag signs or symptoms.  TREATMENT PLAN:   - Referral to neurosurgery, Dr. Elmon Else for review of imaging and possible consultation.   - MRI cervical spine done last month.  - Cymbalta was not prescribed because patient is already on Celexa.

## 2018-04-20 NOTE — Unmapped (Addendum)
PM&R - [facility]       Telephone Visit     ? Provider attests to being located in the [place] of [place] and patient confirms being located in [place] at time of visit.    ? This medically necessary service took place in the setting the COVID-19 Pandemic State of Emergency and required 11-20 minutes of medical discussion [phone]).  ? Patient was strongly advised to contact provider if there is any change in their medical condition or if any new problems arise.    Patient Information     Patient Name:Rodney Bender  MRN: [id number]  DOB: 05-28-1970  Age: 48 y.o.     Assessment & Plan     04/20/2018     DIAGNOSIS: Right Cervical Radiculopathy. Previous cervical fusion ~ 2015 at [facility]. [place]orn[place] right arm pain and weakness (patient notes symptoms ongoing or 2 years). Prior exam consistent with right arm weakness. No other red flag signs or symptoms.  TREATMENT PLAN:   - Referral to neurosurgery, Dr. Elmon Else for review of imaging and possible consultation.   - MRI cervical spine done last month.    Comprehensive patient education performed today explaining that the care plan will integrate multimodal activity-based rehabilitation techniques to enhance recovery, reduce pain, and facilitate functionality. Risks, benefits, and instructions for all medications prescribed / treatments offered  reviewed extensively with patient, who expresses understanding.  Patient strongly advised regarding red flag signs such as new or progressive motor weakness, sensory deficits, saddle anesthesia, bowel/bladder dysfunction, gait/coordination disturbance, weight loss, and night pain that should prompt evaluation at nearest ER.    Subjective     Chief Complaint   No chief complaint on file.      History of Present Illness   [name] is a 49 y.o. year old male being evaluated via phone visit for No chief complaint on file.      Symptom Location: neck pain, radiates into the right arm  Symptom Character: burning, tingling and shooting  Symptom Onset/Mechanism: chronic (>/= 3 months)  Temporal Pattern: constant  What number best describes your pain on average in the past week?:  7  Aggravating Factors: neck rotation, neck extension  Alleviating Factors: rest  Unintended Weight Loss: no  Fever/Infection:no  Neuromotor Function: motor weakness - (yes) right arm weakness,  gait/coordination disturbance - (no), loss of bowel or bladder control - (no), saddle anesthesia - (no)     Medications     Current Outpatient Medications   Medication Sig Dispense Refill   ??? busPIRone (BUSPAR) 10 MG tablet Take 10 mg by mouth Two (2) times a day. Takes as needed     ??? citalopram (CELEXA) 20 MG tablet Take 1 tablet (20 mg total) by mouth daily. (Patient taking differently: Take 20 mg by mouth daily. Takes as needed) 90 tablet 3   ??? dronabinol (MARINOL) 10 MG capsule TAKE 1 CAPSULE BY MOUTH TWICE DAILY 30 MINUTES BEFORE A MEAL. 60 each 0   ??? gabapentin (NEURONTIN) 300 MG capsule Take 300 mg by mouth Three (3) times a day. Takes as needed     ??? hydroxyzine (ATARAX) 25 MG tablet Take 1 tablet (25 mg total) by mouth every six (6) hours as needed. 90 capsule 3   ??? 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 oxyCODONE-acetaminophen (PERCOCET) 10-325 mg per tablet Take 1 tablet by mouth every eight (8) hours as needed for pain.      ???  prochlorperazine (COMPAZINE) 5 MG tablet Take 1-2 tablets (5-10 mg total) by mouth every eight (8) hours as needed for nausea (vomiting). 60 tablet 5   ??? sildenafil (VIAGRA) 50 MG tablet Take 50 mg by mouth daily as needed for erectile dysfunction.     ??? ustekinumab (STELARA) 90 mg/mL Syrg syringe Inject 1 mL (90 mg total) under the skin every 8 weeks. 1 mL 5   ??? vancomycin (VANCOCIN) 250 MG capsule Take according to prescribed taper (this is the remaining quantity to supplement the fill on 11/26) (Patient not taking: Reported on 04/06/2018) 23 capsule 0     No current facility-administered medications for this visit.        Allergies   Dilaudid [hydromorphone]; Morphine; Opioids - morphine analogues; Tramadol; and Vicodin [hydrocodone-acetaminophen]    Past Medical/Surgical/Social/Family History     Past Medical History:   Diagnosis Date   ??? Anxiety    ??? Crohn's disease (CMS-HCC)     diagnosed in 1990   ??? GERD (gastroesophageal reflux disease)    ??? Hypertension 10/08/2016     Patient Active Problem List    Diagnosis Date Noted   ??? Chronic abdominal pain 01/08/2017   ??? Crohn's disease of both small and large intestine with other complication (CMS-HCC) 09/13/2016   ??? 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 Tobacco use disorder 03/25/2009   ??? Mixed, or nondependent drug abuse, in remission (CMS-HCC) 03/25/2009   ??? Chronic nausea 12/31/2004     Past Surgical History:   Procedure Laterality Date   ??? COLON SURGERY     ??? PR COLONOSCOPY FLX DX W/COLLJ SPEC WHEN PFRMD Left 11/10/2012    Procedure: COLONOSCOPY, FLEXIBLE, PROXIMAL TO SPLENIC FLEXURE; DIAGNOSTIC, W/WO COLLECTION SPECIMEN BY BRUSH OR WASH;  Surgeon: Malcolm Metro, MD;  [facility];  Service: Gastroenterology   ??? PR COLONOSCOPY FLX DX W/COLLJ SPEC WHEN PFRMD N/A 07/21/2013    Procedure: COLONOSCOPY, FLEXIBLE, PROXIMAL TO SPLENIC FLEXURE; DIAGNOSTIC, W/WO COLLECTION SPECIMEN BY BRUSH OR WASH;  Surgeon: Gwen Pounds, MD;  [facility]: [facility];  Service: Gastroenterology   ??? PR COLONOSCOPY FLX DX W/COLLJ SPEC WHEN PFRMD N/A 08/09/2016    Procedure: COLONOSCOPY, FLEXIBLE, PROXIMAL TO SPLENIC FLEXURE; DIAGNOSTIC, W/WO COLLECTION SPECIMEN BY BRUSH OR WASH;  Surgeon: Janyth Pupa, MD;  Location: [facility];  Service: Gastroenterology   ??? PR COLONOSCOPY FLX DX W/COLLJ SPEC WHEN PFRMD N/A 04/06/2018    Procedure: COLONOSCOPY, FLEXIBLE, PROXIMAL TO SPLENIC FLEXURE; DIAGNOSTIC, W/WO COLLECTION SPECIMEN BY BRUSH OR WASH;  Surgeon: Zetta Bills, MD; Location: [facility];  Service: Gastroenterology   ??? PR COLONOSCOPY W/BIOPSY SINGLE/MULTIPLE  07/23/2012    Procedure: COLONOSCOPY, FLEXIBLE, PROXIMAL TO SPLENIC FLEXURE; WITH BIOPSY, SINGLE OR MULTIPLE;  Surgeon: Vickii Chafe, MD;  Location: [facility];  Service: Gastroenterology   ??? PR COLONOSCOPY W/BIOPSY SINGLE/MULTIPLE N/A 07/15/2014    Procedure: COLONOSCOPY, FLEXIBLE, PROXIMAL TO SPLENIC FLEXURE; WITH BIOPSY, SINGLE OR MULTIPLE;  Surgeon: Janyth Pupa, MD;  [facility];  Service: Gastroenterology   ??? PR COLONOSCOPY W/BIOPSY SINGLE/MULTIPLE N/A 03/21/2017    Procedure: COLONOSCOPY, FLEXIBLE, PROXIMAL TO SPLENIC FLEXURE; WITH BIOPSY, SINGLE OR MULTIPLE;  Surgeon: Modena Nunnery, MD;  Location: [facility];  Service: Gastroenterology   ??? PR COLSC FLEXIBLE W/TRANSENDOSCOPIC BALLOON DILAT N/A 07/15/2014    Procedure: COLONOSCOPY, FLEXIBLE; WITH DILATION BY BALLOON, 1 OR MORE STRICTURES;  Surgeon: Gaspar Garbe  Verne Carrow, MD;  [facility];  Service: Gastroenterology   ??? PR REMVL COLON & TERM ILEUM W/ILEOCOLOSTOMY N/A 09/16/2016    Procedure: COLECTOMY, PARTIAL, WITH REMOVAL OF TERMINAL ILEUM WITH ILEOCOLOSTOMY;  Surgeon: Lady Gary, MD;  Location: MAIN OR [place];  Service: Gastrointestinal   ??? PR UPPER GI ENDOSCOPY,BIOPSY N/A 07/23/2012    Procedure: UGI ENDOSCOPY; WITH BIOPSY, SINGLE OR MULTIPLE;  Surgeon: Vickii Chafe, MD;  [facility]: [facility];  Service: Gastroenterology   ??? PR UPPER GI ENDOSCOPY,DIAGNOSIS N/A 11/10/2012    Procedure: UGI ENDO, INCLUDE ESOPHAGUS, STOMACH, & DUODENUM &/OR JEJUNUM; DX W/WO COLLECTION SPECIMN, BY BRUSH OR WASH;  Surgeon: Malcolm Metro, MD;  [facility]: [facility];  Service: Gastroenterology   ??? PR UPPER GI ENDOSCOPY,DIAGNOSIS N/A 07/21/2013    Procedure: UGI ENDO, INCLUDE ESOPHAGUS, STOMACH, & DUODENUM &/OR JEJUNUM; DX W/WO COLLECTION SPECIMN, BY BRUSH OR WASH;  Surgeon: Gwen Pounds, MD;  Location: [facility];  Service: Gastroenterology   ??? PR UPPER GI ENDOSCOPY,DIAGNOSIS N/A 07/15/2014    Procedure: UGI ENDO, INCLUDE ESOPHAGUS, STOMACH, & DUODENUM &/OR JEJUNUM; DX W/WO COLLECTION SPECIMN, BY BRUSH OR WASH;  Surgeon: Janyth Pupa, MD;  [facility];  Service: Gastroenterology   ??? SPINE SURGERY         Social History     Socioeconomic History   ??? Marital status: Single     Spouse name: Not on file   ??? Number of children: Not on file   ??? Years of education: Not on file   ??? Highest education level: Not on file   Occupational History     Comment: not working   Social Needs   ??? Financial resource strain: Not hard at all   ??? Food insecurity     Worry: Never true     Inability: Never true   ??? Transportation needs     Medical: No     Non-medical: No   Tobacco Use   ??? Smoking status: Former Smoker     Packs/day: 1.00     Years: 18.00     Pack years: 18.00     Types: Cigarettes     Start date: 08/27/2003     Last attempt to quit: 06/06/2017     Years since quitting: 0.8   ??? Smokeless tobacco: Never Used   ??? Tobacco comment: Pt smokes 1ppd, Pt is interested in tobacco cessation    Substance and Sexual Activity   ??? Alcohol use: No   ??? Drug use: Not Currently     Types: Marijuana   ??? Sexual activity: Yes     Partners: Female     Birth control/protection: Condom   Lifestyle   ??? Physical activity     Days per week: 3 days     Minutes per session: 30 min   ??? Stress: Very much   Relationships   ??? Social Wellsite geologist on phone: More than three times a week     Gets together: More than three times a week     Attends religious service: 1 to 4 times per year     Active member of club or organization: Yes     Attends meetings of clubs or organizations: 1 to 4 times per year     Relationship status: Married   Other Topics Concern   ??? Exercise Not Asked   ??? Living Situation No  Comment: lives with wife   Social History Narrative   ??? Not on file       Family History   Problem Relation Age of Onset   ??? Hyperlipidemia Father    ??? Cancer Father    ??? Cancer Maternal Aunt    ??? Stroke Mother    ??? No Known Problems Sister    ??? No Known Problems Brother    ??? No Known Problems Maternal Uncle    ??? No Known Problems Paternal Aunt    ??? No Known Problems Paternal Uncle    ??? No Known Problems Maternal Grandmother    ??? No Known Problems Maternal Grandfather    ??? No Known Problems Paternal Grandmother    ??? No Known Problems Paternal Grandfather    ??? Anesthesia problems Neg Hx    ??? Broken bones Neg Hx    ??? Clotting disorder Neg Hx    ??? Collagen disease Neg Hx    ??? Diabetes Neg Hx    ??? Dislocations Neg Hx    ??? Fibromyalgia Neg Hx    ??? Gout Neg Hx    ??? Hemophilia Neg Hx    ??? Osteoporosis Neg Hx    ??? Rheumatologic disease Neg Hx    ??? Scoliosis Neg Hx    ??? Severe sprains Neg Hx    ??? Sickle cell anemia Neg Hx    ??? Spinal Compression Fracture Neg Hx        For any of the above entries which indicate no records on file, the patient reports no relevant history.    Review of Systems   Pertinent positives are noted in HPI.  Patient has been instructed to followup with PCP or appropriate specialist for symptoms outside the purview of this speciality.    Objective     Imaging   MRI C Spine 2020:  - Sequela of previous C6-C7 ACDF.   - Cervical spondylosis. No more than mild spinal canal stenosis. Multilevel neural foraminal narrowing greatest on the left at C5-C6 (moderate/severe at this site). Additional sites of mild and moderate neural foraminal narrowing as described.    This patient visit was completed through the use of an audio/video or telephone encounter.      This patient encounter is appropriate and reasonable under the circumstances given the patient's particular presentation at this time. The patient has been advised of the potential risks and limitations of this mode of treatment (including, but not limited to, the absence of in-person examination) and has agreed to be treated in a remote fashion in spite of them. Any and all of the patient's/patient's family's questions on this issue have been answered.     The patient has also been advised to contact this office for worsening conditions or problems, and seek emergency medical treatment and/or call 911 if the patient deems either necessary.    Nicanor Bake) Glenice Laine, MD  Assistant Professor - PM&R  Assistant Professor - Department of Social Medicine  Interventional Spine Specialist - [facility] [place] [facility] [place]???[facility] [employer]

## 2018-04-23 MED ORDER — DRONABINOL 10 MG CAPSULE
0 refills | 0 days | Status: CP
Start: 2018-04-23 — End: 2018-06-18

## 2018-04-23 NOTE — Unmapped (Signed)
I spoke with Mr. Hosick who is doing excellent. We will reschedule for about 2 months out.

## 2018-05-14 NOTE — Unmapped (Signed)
[facility] Clinical Assessment & Refill Coordination Note    Rodney Bender, Rodney Bender: January 05, 1971  Phone: [phone] (home)     All above HIPAA information was verified with patient.     Specialty Medication(s):   Inflammatory Disorders: Stelara     Current Outpatient Medications   Medication Sig Dispense Refill   ??? busPIRone (BUSPAR) 10 MG tablet Take 10 mg by mouth Two (2) times a day. Takes as needed     ??? citalopram (CELEXA) 20 MG tablet Take 1 tablet (20 mg total) by mouth daily. (Patient taking differently: Take 20 mg by mouth daily. Takes as needed) 90 tablet 3   ??? dronabinoL (MARINOL) 10 MG capsule TAKE 1 CAPSULE BY MOUTH TWICE DAILY 30 MINUTES BEFORE A MEAL 60 each 0   ??? gabapentin (NEURONTIN) 300 MG capsule Take 300 mg by mouth Three (3) times a day. Takes as needed     ??? hydroxyzine (ATARAX) 25 MG tablet Take 1 tablet (25 mg total) by mouth every six (6) hours as needed. 90 capsule 3   ??? 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 oxyCODONE-acetaminophen (PERCOCET) 10-325 mg per tablet Take 1 tablet by mouth every eight (8) hours as needed for pain.      ??? prochlorperazine (COMPAZINE) 5 MG tablet Take 1-2 tablets (5-10 mg total) by mouth every eight (8) hours as needed for nausea (vomiting). 60 tablet 5   ??? sildenafil (VIAGRA) 50 MG tablet Take 50 mg by mouth daily as needed for erectile dysfunction.     ??? ustekinumab (STELARA) 90 mg/mL Syrg syringe Inject 1 mL (90 mg total) under the skin every 8 weeks. 1 mL 5   ??? vancomycin (VANCOCIN) 250 MG capsule Take according to prescribed taper (this is the remaining quantity to supplement the fill on 11/26) (Patient not taking: Reported on 04/06/2018) 23 capsule 0     No current facility-administered medications for this visit.         Changes to medications: Rodney Bender reports no changes at this time.    Allergies   Allergen Reactions   ??? Dilaudid [Hydromorphone] Anxiety   ??? Morphine Itching ??? Opioids - Morphine Analogues Itching   ??? Tramadol Itching and Anxiety   ??? Vicodin [Hydrocodone-Acetaminophen] Anxiety       Changes to allergies: No    SPECIALTY MEDICATION ADHERENCE     Stelara 90 mg/ml: 0 days of medicine on hand     Specialty medication(s) dose(s) confirmed: Regimen is correct and unchanged.     Are there any concerns with adherence? No    Adherence counseling provided? Not needed    CLINICAL MANAGEMENT AND INTERVENTION      Clinical Benefit Assessment:    Do you feel the medicine is effective or helping your condition? Yes    Clinical Benefit counseling provided? Progress note from 03/27/18 shows evidence of clinical benefit    Adverse Effects Assessment:    Are you experiencing any side effects? No    Are you experiencing difficulty administering your medicine? No    Quality of Life Assessment:    How many days over the past month did your Crohn's disease  keep you from your normal activities? For example, brushing your teeth or getting up in the morning. 0    Have you discussed this with your provider? Not needed    Therapy Appropriateness:    Is therapy appropriate? Yes, therapy is  appropriate and should be continued    DISEASE/MEDICATION-SPECIFIC INFORMATION      For patients on injectable medications: Patient currently has 0 doses left.  Next injection is scheduled for April 23-25 timeframe.    PATIENT SPECIFIC NEEDS     ? Does the patient have any physical, cognitive, or cultural barriers? No    ? Is the patient high risk? No     ? Does the patient require a Care Management Plan? No     ? Does the patient require physician intervention or other additional services (i.e. nutrition, smoking cessation, social work)? No      SHIPPING     Specialty Medication(s) to be Shipped:   Inflammatory Disorders: Stelara    Other medication(s) to be shipped: none     Changes to insurance: No    Delivery Scheduled: Yes, Expected medication delivery date: 05/18/18.     Medication will be delivered via Same Day Courier to the confirmed home address in [facility].    The patient will receive a drug information handout for each medication shipped and additional FDA Medication Guides as required.  Verified that patient has previously received a Conservation officer, historic buildings.    [name] [facility] Specialty Pharmacist

## 2018-05-18 ENCOUNTER — Institutional Professional Consult (permissible substitution)
Admit: 2018-05-18 | Discharge: 2018-05-19 | Payer: MEDICARE | Attending: Physical Medicine & Rehabilitation | Primary: Physical Medicine & Rehabilitation

## 2018-05-18 MED FILL — STELARA 90 MG/ML SUBCUTANEOUS SYRINGE: SUBCUTANEOUS | 30 days supply | Qty: 1 | Fill #4

## 2018-05-18 MED FILL — STELARA 90 MG/ML SUBCUTANEOUS SYRINGE: 30 days supply | Qty: 1 | Fill #4 | Status: AC

## 2018-05-18 NOTE — Unmapped (Signed)
PM&R - [facility]       Telemedicine Encounter (Video Visit or Telephone Visit)      ? This medically necessary service took place in the setting of the COVID-19 Pandemic State of Emergency.    Patient Information     Patient Name:Rodney Bender  MRN: [id number]  DOB: 1970/03/04  Age: 48 y.o.     Assessment & Plan     05/18/2018     DIAGNOSIS:??Right Cervical Radiculopathy. Previous cervical fusion ~ 2015 at [facility]. Chornic right arm pain and weakness (patient notes symptoms ongoing or 2 years). Prior exam consistent with right arm weakness. No other red flag signs or symptoms.  TREATMENT PLAN:??  - Referral to neurosurgery, Dr. Elmon Else for review of imaging placed at last visit.   - MRI cervical spine done last month reviewed with patient.  - Cymbalta was not ordered because patient already takes Celexa.    Comprehensive patient education performed today explaining that the care plan will integrate multimodal activity-based rehabilitation techniques to enhance recovery, reduce pain, and facilitate functionality. Risks, benefits, and instructions for all medications prescribed / treatments offered  reviewed extensively with patient, who expresses understanding.  Patient strongly advised regarding red flag signs such as new or progressive motor weakness, sensory deficits, saddle anesthesia, bowel/bladder dysfunction, gait/coordination disturbance, weight loss, and night pain that should prompt evaluation at nearest ER.    Subjective     Chief Complaint   Back pain     History of Present Illness   Rodney Bender is a 48 y.o. year old male being evaluated via phone visit.    Symptom Location: neck pain, radiates into the right arm, unchanged  Symptom Character: burning, tingling and shooting  Symptom Onset/Mechanism: chronic (>/= 3 months)  Temporal Pattern: constant  What number best describes your pain on average in the past week?:  7  Aggravating Factors: neck rotation, neck extension  Alleviating Factors: rest  Unintended Weight Loss: no  Fever/Infection:no  Neuromotor Function: motor weakness - (yes) right arm weakness,  gait/coordination disturbance - (no), loss of bowel or bladder control - (no), saddle anesthesia - (no)     Medications     Current Outpatient Medications   Medication Sig Dispense Refill   ??? busPIRone (BUSPAR) 10 MG tablet Take 10 mg by mouth Two (2) times a day. Takes as needed     ??? citalopram (CELEXA) 20 MG tablet Take 1 tablet (20 mg total) by mouth daily. (Patient taking differently: Take 20 mg by mouth daily. Takes as needed) 90 tablet 3   ??? dronabinoL (MARINOL) 10 MG capsule TAKE 1 CAPSULE BY MOUTH TWICE DAILY 30 MINUTES BEFORE A MEAL 60 each 0   ??? gabapentin (NEURONTIN) 300 MG capsule Take 300 mg by mouth Three (3) times a day. Takes as needed     ??? hydroxyzine (ATARAX) 25 MG tablet Take 1 tablet (25 mg total) by mouth every six (6) hours as needed. 90 capsule 3   ??? 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 oxyCODONE-acetaminophen (PERCOCET) 10-325 mg per tablet Take 1 tablet by mouth every eight (8) hours as needed for pain.      ??? prochlorperazine (COMPAZINE) 5 MG tablet Take 1-2 tablets (5-10 mg total) by mouth every eight (8) hours as needed for nausea (vomiting). 60 tablet 5   ??? sildenafil (VIAGRA) 50 MG tablet Take 50 mg by mouth daily as needed for erectile dysfunction.     ???  ustekinumab (STELARA) 90 mg/mL Syrg syringe Inject 1 mL (90 mg total) under the skin every 8 weeks. 1 mL 5   ??? vancomycin (VANCOCIN) 250 MG capsule Take according to prescribed taper (this is the remaining quantity to supplement the fill on 11/26) (Patient not taking: Reported on 04/06/2018) 23 capsule 0     No current facility-administered medications for this visit.        Allergies   Dilaudid [hydromorphone]; Morphine; Opioids - morphine analogues; Tramadol; and Vicodin [hydrocodone-acetaminophen]    Past Medical/Surgical/Social/Family History     Past Medical History:   Diagnosis Date   ??? Anxiety    ??? Crohn's disease (CMS-HCC)     diagnosed in 1990   ??? GERD (gastroesophageal reflux disease)    ??? Hypertension 10/08/2016     Patient Active Problem List    Diagnosis Date Noted   ??? Chronic abdominal pain 01/08/2017   ??? Crohn's disease of both small and large intestine with other complication (CMS-HCC) 09/13/2016   ??? 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 Tobacco use disorder 03/25/2009   ??? Mixed, or nondependent drug abuse, in remission (CMS-HCC) 03/25/2009   ??? Chronic nausea 12/31/2004     Past Surgical History:   Procedure Laterality Date   ??? COLON SURGERY     ??? PR COLONOSCOPY FLX DX W/COLLJ SPEC WHEN PFRMD Left 11/10/2012    Procedure: COLONOSCOPY, FLEXIBLE, PROXIMAL TO SPLENIC FLEXURE; DIAGNOSTIC, W/WO COLLECTION SPECIMEN BY BRUSH OR WASH;  Surgeon: Malcolm Metro, MD;  [facility];  Service: Gastroenterology   ??? PR COLONOSCOPY FLX DX W/COLLJ SPEC WHEN PFRMD N/A 07/21/2013    Procedure: COLONOSCOPY, FLEXIBLE, PROXIMAL TO SPLENIC FLEXURE; DIAGNOSTIC, W/WO COLLECTION SPECIMEN BY BRUSH OR WASH;  Surgeon: Gwen Pounds, MD;  Location: [facility];  Service: Gastroenterology   ??? PR COLONOSCOPY FLX DX W/COLLJ SPEC WHEN PFRMD N/A 08/09/2016    Procedure: COLONOSCOPY, FLEXIBLE, PROXIMAL TO SPLENIC FLEXURE; DIAGNOSTIC, W/WO COLLECTION SPECIMEN BY BRUSH OR WASH;  Surgeon: Janyth Pupa, MD;  Location: [facility];  Service: Gastroenterology   ??? PR COLONOSCOPY FLX DX W/COLLJ SPEC WHEN PFRMD N/A 04/06/2018    Procedure: COLONOSCOPY, FLEXIBLE, PROXIMAL TO SPLENIC FLEXURE; DIAGNOSTIC, W/WO COLLECTION SPECIMEN BY BRUSH OR WASH;  Surgeon: Zetta Bills, MD;  Location: [facility] [place] [facility];  Service: Gastroenterology   ??? PR COLONOSCOPY W/BIOPSY SINGLE/MULTIPLE  07/23/2012    Procedure: COLONOSCOPY, FLEXIBLE, PROXIMAL TO SPLENIC FLEXURE; WITH BIOPSY, SINGLE OR MULTIPLE;  Surgeon: Vickii Chafe, MD; Location: [facility];  Service: Gastroenterology   ??? PR COLONOSCOPY W/BIOPSY SINGLE/MULTIPLE N/A 07/15/2014    Procedure: COLONOSCOPY, FLEXIBLE, PROXIMAL TO SPLENIC FLEXURE; WITH BIOPSY, SINGLE OR MULTIPLE;  Surgeon: Janyth Pupa, MD;  [facility]: [facility];  Service: Gastroenterology   ??? PR COLONOSCOPY W/BIOPSY SINGLE/MULTIPLE N/A 03/21/2017    Procedure: COLONOSCOPY, FLEXIBLE, PROXIMAL TO SPLENIC FLEXURE; WITH BIOPSY, SINGLE OR MULTIPLE;  Surgeon: Modena Nunnery, MD;  Location: [facility];  Service: Gastroenterology   ??? PR COLSC FLEXIBLE W/TRANSENDOSCOPIC BALLOON DILAT N/A 07/15/2014    Procedure: COLONOSCOPY, FLEXIBLE; WITH DILATION BY BALLOON, 1 OR MORE STRICTURES;  Surgeon: Janyth Pupa, MD;  Location: [facility];  Service: Gastroenterology   ??? PR REMVL COLON & TERM ILEUM W/ILEOCOLOSTOMY N/A 09/16/2016    Procedure: COLECTOMY, PARTIAL, WITH REMOVAL OF TERMINAL ILEUM WITH ILEOCOLOSTOMY;  Surgeon: Lady Gary, MD;  Location: MAIN OR [place];  Service: Gastrointestinal   ??? PR UPPER GI ENDOSCOPY,BIOPSY N/A 07/23/2012    Procedure: UGI ENDOSCOPY; WITH BIOPSY, SINGLE OR MULTIPLE;  Surgeon: Vickii Chafe, MD;  [facility]: [facility];  Service: Gastroenterology   ??? PR UPPER GI ENDOSCOPY,DIAGNOSIS N/A 11/10/2012    Procedure: UGI ENDO, INCLUDE ESOPHAGUS, STOMACH, & DUODENUM &/OR JEJUNUM; DX W/WO COLLECTION SPECIMN, BY BRUSH OR WASH;  Surgeon: Malcolm Metro, MD;  [facility];  Service: Gastroenterology   ??? PR UPPER GI ENDOSCOPY,DIAGNOSIS N/A 07/21/2013    Procedure: UGI ENDO, INCLUDE ESOPHAGUS, STOMACH, & DUODENUM &/OR JEJUNUM; DX W/WO COLLECTION SPECIMN, BY BRUSH OR WASH;  Surgeon: Gwen Pounds, MD;  [facility]: [facility];  Service: Gastroenterology   ??? PR UPPER GI ENDOSCOPY,DIAGNOSIS N/A 07/15/2014    Procedure: UGI ENDO, INCLUDE ESOPHAGUS, STOMACH, & DUODENUM &/OR JEJUNUM; DX W/WO COLLECTION SPECIMN, BY BRUSH OR WASH;  Surgeon: Janyth Pupa, MD;  [facility]: [facility];  Service: Gastroenterology   ??? SPINE SURGERY         Social History     Socioeconomic History   ??? Marital status: Single     Spouse name: Not on file   ??? Number of children: Not on file   ??? Years of education: Not on file   ??? Highest education level: Not on file   Occupational History     Comment: not working   Social Needs   ??? Financial resource strain: Not hard at all   ??? Food insecurity     Worry: Never true     Inability: Never true   ??? Transportation needs     Medical: No     Non-medical: No   Tobacco Use   ??? Smoking status: Former Smoker     Packs/day: 1.00     Years: 18.00     Pack years: 18.00     Types: Cigarettes     Start date: 08/27/2003     Last attempt to quit: 06/06/2017     Years since quitting: 0.9   ??? Smokeless tobacco: Never Used   ??? Tobacco comment: Pt smokes 1ppd, Pt is interested in tobacco cessation    Substance and Sexual Activity   ??? Alcohol use: No   ??? Drug use: Not Currently     Types: Marijuana   ??? Sexual activity: Yes     Partners: Female     Birth control/protection: Condom   Lifestyle   ??? Physical activity     Days per week: 3 days     Minutes per session: 30 min   ??? Stress: Very much   Relationships   ??? Social Wellsite geologist on phone: More than three times a week     Gets together: More than three times a week     Attends religious service: 1 to 4 times per year     Active member of club or organization: Yes     Attends meetings of clubs or organizations: 1 to 4 times per year     Relationship status: Married   Other Topics Concern   ??? Exercise Not Asked   ??? Living Situation No     Comment: lives with wife   Social History Narrative   ??? Not on file       Family History   Problem Relation Age of Onset   ??? Hyperlipidemia Father    ??? Cancer Father    ???  Cancer Maternal Aunt    ??? Stroke Mother    ??? No Known Problems Sister    ??? No Known Problems Brother ??? No Known Problems Maternal Uncle    ??? No Known Problems Paternal Aunt    ??? No Known Problems Paternal Uncle    ??? No Known Problems Maternal Grandmother    ??? No Known Problems Maternal Grandfather    ??? No Known Problems Paternal Grandmother    ??? No Known Problems Paternal Grandfather    ??? Anesthesia problems Neg Hx    ??? Broken bones Neg Hx    ??? Clotting disorder Neg Hx    ??? Collagen disease Neg Hx    ??? Diabetes Neg Hx    ??? Dislocations Neg Hx    ??? Fibromyalgia Neg Hx    ??? Gout Neg Hx    ??? Hemophilia Neg Hx    ??? Osteoporosis Neg Hx    ??? Rheumatologic disease Neg Hx    ??? Scoliosis Neg Hx    ??? Severe sprains Neg Hx    ??? Sickle cell anemia Neg Hx    ??? Spinal Compression Fracture Neg Hx        For any of the above entries which indicate no records on file, the patient reports no relevant history.    Review of Systems   Pertinent positives are noted in HPI.  Patient has been instructed to followup with PCP or appropriate specialist for symptoms outside the purview of this speciality.    Objective     Imaging     I spent 5 minutes on the phone with the patient. I spent an additional 3 minutes on pre- and post-visit activities.     The patient was physically located in [place] or a state in which I am permitted to provide care. The patient understood that s/he may incur co-pays and cost sharing, and agreed to the telemedicine visit. The visit was completed via phone and/or video, which was appropriate and reasonable under the circumstances given the patient's presentation at the time.    The patient has been advised of the potential risks and limitations of this mode of treatment (including, but not limited to, the absence of in-person examination) and has agreed to be treated using telemedicine. The patient's/patient's family's questions regarding telemedicine have been answered.     If the phone/video visit was completed in an ambulatory setting, the patient has also been advised to contact their provider???s office for worsening conditions, and seek emergency medical treatment and/or call 911 if the patient deems either necessary.      Nicanor Bake) Glenice Laine, MD  Assistant Professor - PM&R  Assistant Professor - Department of Social Medicine  Interventional Spine Specialist - [facility] [place] [employer]

## 2018-06-15 ENCOUNTER — Ambulatory Visit: Payer: Medicare Other | Attending: Nurse Practitioner | Admitting: Nurse Practitioner

## 2018-06-15 ENCOUNTER — Telehealth: Payer: Self-pay

## 2018-06-15 ENCOUNTER — Other Ambulatory Visit: Payer: Self-pay

## 2018-06-15 DIAGNOSIS — R109 Unspecified abdominal pain: Secondary | ICD-10-CM | POA: Diagnosis not present

## 2018-06-15 DIAGNOSIS — G8929 Other chronic pain: Secondary | ICD-10-CM

## 2018-06-15 DIAGNOSIS — K5090 Crohn's disease, unspecified, without complications: Secondary | ICD-10-CM | POA: Diagnosis not present

## 2018-06-15 DIAGNOSIS — M542 Cervicalgia: Secondary | ICD-10-CM | POA: Diagnosis not present

## 2018-06-15 DIAGNOSIS — G894 Chronic pain syndrome: Secondary | ICD-10-CM

## 2018-06-15 MED ORDER — OXYCODONE-ACETAMINOPHEN 10-325 MG PO TABS: 1.0000 | ORAL_TABLET | Freq: Three times a day (TID) | ORAL | 0 refills | Status: DC | PRN

## 2018-06-15 MED ORDER — OXYCODONE-ACETAMINOPHEN 10-325 MG PO TABS: 1.0000 | ORAL_TABLET | Freq: Three times a day (TID) | ORAL | 0 refills | Status: AC | PRN

## 2018-06-15 NOTE — Telephone Encounter (Signed)
Pharmacy notifed that no, he could not fill early.

## 2018-06-15 NOTE — Progress Notes (Signed)
Pain Management Encounter Note - Virtual Visit via Telephone Telehealth (real-time audio visits between healthcare provider and patient).  Patient's Phone No. & Preferred Pharmacy:  [phone] (home); [phone] (mobile); (Preferred) [facility] ([address] [place]) [place] [address] Phone: [phone] Fax: [phone]   Pre-screening note:  Our staff contacted Mr. Mccullars and offered him an "in person", "face-to-face" appointment versus a telephone encounter. He indicated preferring the telephone encounter, at this time.  Reason for Virtual Visit: COVID-19*  Social distancing based on CDC and AMA recommendations.   I contacted [name] on 06/15/2018 at 9:05 AM by telephone and clearly identified myself as Dionisio David, NP. I verified that I was speaking with the correct person using two identifiers (Name and date of birth: [birth year]).  Advanced Informed Consent I sought verbal advanced consent from [name] for telemedicine interactions and virtual visit. I informed Mr. Kisling of the security and privacy concerns, risks, and limitations associated with performing an evaluation and management service by telephone. I also informed Mr. Davtyan of the availability of "in person" appointments and I informed him of the possibility of a patient responsible charge related to this service. Mr. Vetsch expressed understanding and agreed to proceed.   Historic Elements   Mr. KENYATTE CHATMON is a 48 y.o. year old, male patient evaluated today after his last encounter by our practice on 04/15/2018. Mr. Placeres  has a past medical history of Cervical radiculopathy, Chronic abdominal pain, Chronic neck pain, Cold, Crohn disease ([place]), GERD (gastroesophageal reflux disease), Hand fracture, left, Headache(784.0), Hypertension, IBS (irritable bowel syndrome), and Shortness of breath. He also  has a past  surgical history that includes Bowel resection; Nevus excision; Anterior cervical decomp/discectomy fusion (N/A, 02/12/2013); Colonoscopy; Colonoscopy with propofol (N/A, 11/13/2015); Esophagogastroduodenoscopy (egd) with propofol (N/A, 11/13/2015); Colon surgery; and Lipoma excision (Right, 01/24/2016). Mr. Morgenstern has a current medication list which includes the following prescription(s): amlodipine-atorvastatin, dronabinol, famotidine, gabapentin, hyoscyamine, naloxone, oxycodone-acetaminophen, oxycodone-acetaminophen, prochlorperazine, and stelara. He  reports that he has quit smoking. His smoking use included cigarettes. He has a 2.20 pack-year smoking history. He has never used smokeless tobacco. He reports previous drug use. Drug: Marijuana. He reports that he does not drink alcohol. Mr. Michaelsen is allergic to vicodin [hydrocodone-acetaminophen]; tramadol; and morphine and related.   HPI  I last saw him on 04/15/2018. He is being evaluated for medication management. He has 7/10 abdominal pain. He has been having one month of neck and right arm pain. He denies any injury. He has some numbness on occasion. He has tingling in his fingers. He has had 10-15 pounds since he has stopped smoking. He admits that he pain is worse with lying on a pillow. He admits that adjusting this does improve the numnbess . He is not able to use NSAIDs se  Pharmacotherapy Assessment  Analgesic:Oxycodone/acetaminophen 10/325 mg up to 3 times daily MME/day:12m/day.   Monitoring: Pharmacotherapy: No side-effects or adverse reactions reported. [place] PMP: PDMP reviewed during this encounter.       Compliance: No problems identified. Plan: Refer to "POC".  Review of recent tests  CT ABDOMEN PELVIS W CONTRAST CLINICAL DATA:  Abdominal pain history of Crohn's with bowel resection  EXAM: CT ABDOMEN AND PELVIS WITH CONTRAST  TECHNIQUE: Multidetector CT imaging of the abdomen and pelvis was performed using the standard  protocol following bolus administration of intravenous contrast.  CONTRAST:  1082mOMNIPAQUE IOHEXOL 300 MG/ML  SOLN  COMPARISON:  CT 03/08/2014  FINDINGS: Lower chest: Mild ground-glass density in the left lower lobe. Small focus of ground-glass density in the anterior right base. No pleural effusion. Normal heart size.  Hepatobiliary: No focal liver abnormality is seen. No gallstones, gallbladder wall thickening, or biliary dilatation.  Pancreas: Unremarkable. No pancreatic ductal dilatation or surrounding inflammatory changes.  Spleen: Normal in size without focal abnormality.  Adrenals/Urinary Tract: Adrenal glands are normal. Subcentimeter hypodensities in the kidneys too small to further characterize. No hydronephrosis. The bladder is normal  Stomach/Bowel: Stomach is nonenlarged. No dilated small bowel. Postsurgical changes in the right lower quadrant. Thickened appearing left lower quadrant small bowel loops.  Vascular/Lymphatic: Nonaneurysmal aorta. Mild aortic atherosclerosis. No significantly enlarged lymph nodes  Reproductive: Prostate is unremarkable.  Other: Negative for free air or free fluid  Musculoskeletal: No acute or significant osseous findings.  IMPRESSION: 1. Thickened appearing left lower quadrant small bowel loops, suggesting active Crohn's disease. No evidence for obstruction. 2. Mild ground-glass density in the left lower lobe with smaller focus of ground-glass density in the right lung base, consistent with respiratory infection.  Electronically Signed   By: Donavan Foil M.D.   On: 03/19/2018 02:34 DG Chest 2 View CLINICAL DATA:  Cough and fever  EXAM: CHEST - 2 VIEW  COMPARISON:  08/07/2015  FINDINGS: Postsurgical changes in the cervical spine. No acute airspace disease or effusion. Normal heart size. No pneumothorax.  IMPRESSION: No active cardiopulmonary disease.  Electronically Signed   By: Donavan Foil M.D.   On:  03/19/2018 00:18   Admission on 03/18/2018, Discharged on 03/20/2018  Component Date Value Ref Range Status  . WBC 03/18/2018 10.1  4.0 - 10.5 K/uL Final  . RBC 03/18/2018 4.34  4.22 - 5.81 MIL/uL Final  . Hemoglobin 03/18/2018 13.5  13.0 - 17.0 g/dL Final  . HCT 03/18/2018 39.6  39.0 - 52.0 % Final  . MCV 03/18/2018 91.2  80.0 - 100.0 fL Final  . MCH 03/18/2018 31.1  26.0 - 34.0 pg Final  . MCHC 03/18/2018 34.1  30.0 - 36.0 g/dL Final  . RDW 03/18/2018 12.0  11.5 - 15.5 % Final  . Platelets 03/18/2018 218  150 - 400 K/uL Final  . nRBC 03/18/2018 0.0  0.0 - 0.2 % Final  . Neutrophils Relative % 03/18/2018 69  % Final  . Neutro Abs 03/18/2018 6.9  1.7 - 7.7 K/uL Final  . Lymphocytes Relative 03/18/2018 24  % Final  . Lymphs Abs 03/18/2018 2.4  0.7 - 4.0 K/uL Final  . Monocytes Relative 03/18/2018 7  % Final  . Monocytes Absolute 03/18/2018 0.7  0.1 - 1.0 K/uL Final  . Eosinophils Relative 03/18/2018 0  % Final  . Eosinophils Absolute 03/18/2018 0.0  0.0 - 0.5 K/uL Final  . Basophils Relative 03/18/2018 0  % Final  . Basophils Absolute 03/18/2018 0.0  0.0 - 0.1 K/uL Final  . WBC Morphology 03/18/2018 >10% Reactive Benign Lymphoctyes   Final   VACUOLATED NEUTROPHILS  . RBC Morphology 03/18/2018 MORPHOLOGY UNREMARKABLE   Final  . Smear Review 03/18/2018 Normal platelet morphology   Final  . Immature Granulocytes 03/18/2018 0  % Final  . Abs Immature Granulocytes 03/18/2018 0.03  0.00 - 0.07 K/uL Final   Performed at [facility], [address] [place], [place] [address]  . Sodium 03/18/2018 130* 135 - 145 mmol/L Final  . Potassium 03/18/2018 4.1  3.5 - 5.1 mmol/L Final  . Chloride 03/18/2018 95* 98 -  111 mmol/L Final  . CO2 03/18/2018 20* 22 - 32 mmol/L Final  . Glucose, Bld 03/18/2018 65* 70 - 99 mg/dL Final  . BUN 03/18/2018 16  6 - 20 mg/dL Final  . Creatinine, Ser 03/18/2018 0.87  0.61 - 1.24 mg/dL Final  . Calcium 03/18/2018 8.8* 8.9 - 10.3 mg/dL Final  . Total  Protein 03/18/2018 7.9  6.5 - 8.1 g/dL Final  . Albumin 03/18/2018 4.3  3.5 - 5.0 g/dL Final  . AST 03/18/2018 29  15 - 41 U/L Final  . ALT 03/18/2018 19  0 - 44 U/L Final  . Alkaline Phosphatase 03/18/2018 59  38 - 126 U/L Final  . Total Bilirubin 03/18/2018 1.1  0.3 - 1.2 mg/dL Final  . GFR calc non Af Amer 03/18/2018 >60  >60 mL/min Final  . GFR calc Af Amer 03/18/2018 >60  >60 mL/min Final  . Anion gap 03/18/2018 15  5 - 15 Final   Performed at [facility], [address] [place], [place] [address]  . Lipase 03/18/2018 40  11 - 51 U/L Final   Performed at [facility], [address] [place], [place] [address]  . Color, Urine 03/18/2018 STRAW* YELLOW Final  . APPearance 03/18/2018 CLEAR* CLEAR Final  . Specific Gravity, Urine 03/18/2018 1.033* 1.005 - 1.030 Final  . pH 03/18/2018 6.0  5.0 - 8.0 Final  . Glucose, UA 03/18/2018 NEGATIVE  NEGATIVE mg/dL Final  . Hgb urine dipstick 03/18/2018 MODERATE* NEGATIVE Final  . Bilirubin Urine 03/18/2018 NEGATIVE  NEGATIVE Final  . Ketones, ur 03/18/2018 20* NEGATIVE mg/dL Final  . Protein, ur 03/18/2018 NEGATIVE  NEGATIVE mg/dL Final  . Nitrite 03/18/2018 NEGATIVE  NEGATIVE Final  . Chalmers Guest 03/18/2018 NEGATIVE  NEGATIVE Final  . RBC / HPF 03/18/2018 0-5  0 - 5 RBC/hpf Final  . WBC, UA 03/18/2018 0-5  0 - 5 WBC/hpf Final  . Bacteria, UA 03/18/2018 NONE SEEN  NONE SEEN Final  . Squamous Epithelial / LPF 03/18/2018 NONE SEEN  0 - 5 Final   Performed at [facility], [address], [place], [place] [address]  . Influenza A By PCR 03/19/2018 POSITIVE* NEGATIVE Final  . Influenza B By PCR 03/19/2018 NEGATIVE  NEGATIVE Final   Comment: (NOTE) The Xpert Xpress Flu assay is intended as an aid in the diagnosis of  influenza and should not be used as a sole basis for treatment.  This  assay is FDA approved for nasopharyngeal swab specimens only. Nasal  washings and aspirates are unacceptable for Xpert Xpress  Flu testing. Performed at [facility], [address], [place], [place] [address]   . TSH 03/18/2018 0.667  0.350 - 4.500 uIU/mL Final   Comment: Performed by a 3rd Generation assay with a functional sensitivity of <=0.01 uIU/mL. Performed at [facility], [address] [place], [place] [address]   . Procalcitonin 03/18/2018 <0.10  ng/mL Final   Comment:        Interpretation: PCT (Procalcitonin) <= 0.5 ng/mL: Systemic infection (sepsis) is not likely. Local bacterial infection is possible. (NOTE)       Sepsis PCT Algorithm           Lower Respiratory Tract                                      Infection PCT Algorithm    ----------------------------     ----------------------------  PCT < 0.25 ng/mL                PCT < 0.10 ng/mL         Strongly encourage             Strongly discourage   discontinuation of antibiotics    initiation of antibiotics    ----------------------------     -----------------------------       PCT 0.25 - 0.50 ng/mL            PCT 0.10 - 0.25 ng/mL               OR       >80% decrease in PCT            Discourage initiation of                                            antibiotics      Encourage discontinuation           of antibiotics    ----------------------------     -----------------------------         PCT >= 0.50 ng/mL              PCT 0.26 - 0.50 ng/mL               AND                                 <80% decrease in PCT             Encourage initiation of                                             antibiotics       Encourage continuation           of antibiotics    ----------------------------     -----------------------------        PCT >= 0.50 ng/mL                  PCT > 0.50 ng/mL               AND         increase in PCT                  Strongly encourage                                      initiation of antibiotics    Strongly encourage escalation           of antibiotics                                      -----------------------------                                           PCT <= 0.25 ng/mL  OR                                        > 80% decrease in PCT                                     Discontinue / Do not initiate                                             antibiotics Performed at [facility], [address] [place], [place] [address]    Assessment  The primary encounter diagnosis was Chronic abdominal pain. Diagnoses of Chronic pain syndrome, Crohn's disease without complication, unspecified gastrointestinal tract location [facility]), and Cervicalgia were also pertinent to this visit.  Plan of Care  I am having Grace Bushy. Greis maintain his amlodipine-atorvastatin, dronabinol, naloxone, gabapentin, hyoscyamine, famotidine, Stelara, prochlorperazine, oxyCODONE-acetaminophen, and oxyCODONE-acetaminophen.  Pharmacotherapy (Medications Ordered): Meds ordered this encounter  Medications  . oxyCODONE-acetaminophen (PERCOCET) 10-325 MG tablet    Sig: Take 1 tablet by mouth 3 (three) times daily as needed for up to 30 days for pain.    Dispense:  75 tablet    Refill:  0    Do not place this medication, or any other prescription from our practice, on "Automatic Refill". Patient may have prescription filled one day early if pharmacy is closed on scheduled refill date.    Order Specific Question:   Supervising Provider    Answer:   Milinda Pointer [phone]  . oxyCODONE-acetaminophen (PERCOCET) 10-325 MG tablet    Sig: Take 1 tablet by mouth 3 (three) times daily as needed for up to 30 days for pain.    Dispense:  75 tablet    Refill:  0    Do not place this medication, or any other prescription from our practice, on "Automatic Refill". Patient may have prescription filled one day early if pharmacy is closed on scheduled refill date.    Order Specific Question:   Supervising Provider    Answer:   Milinda Pointer [phone]    Orders:  No orders of the defined types were placed in this encounter.  Follow-up plan:   Return in about 2 months (around 08/15/2018) for MedMgmt.   I discussed the assessment and treatment plan with the patient. The patient was provided an opportunity to ask questions and all were answered. The patient agreed with the plan and demonstrated an understanding of the instructions.  Patient advised to call back or seek an in-person evaluation if the symptoms or condition worsens.  Total duration of non-face-to-face encounter: 13 minutes.  Note by: Dionisio David, NP Date: 06/15/2018; Time: 12:28 PM  Disclaimer:  * Given the special circumstances of the COVID-19 pandemic, the federal government has announced that the Office for Civil Rights (OCR) will exercise its enforcement discretion and will not impose penalties on physicians using telehealth in the event of noncompliance with regulatory requirements under the [place] (HIPAA) in connection with the good faith provision of telehealth during the BZJIR-67 national public health emergency. ([place])

## 2018-06-15 NOTE — Patient Instructions (Addendum)
____________________________________________________________________________________________  Medication Rules  Purpose: To inform patients, and their family members, of our rules and regulations.  Applies to: All patients receiving prescriptions (written or electronic).  Pharmacy of record: Pharmacy where electronic prescriptions will be sent. If written prescriptions are taken to a different pharmacy, please inform the nursing staff. The pharmacy listed in the electronic medical record should be the one where you would like electronic prescriptions to be sent.  Electronic prescriptions: In compliance with the [place] Strengthen Opioid Misuse Prevention (STOP) Act of 2017 (Session Law 2017-74/H243), effective January 28, 2018, all controlled substances must be electronically prescribed. Calling prescriptions to the pharmacy will cease to exist.  Prescription refills: Only during scheduled appointments. Applies to all prescriptions.  NOTE: The following applies primarily to controlled substances (Opioid* Pain Medications).   Patient's responsibilities: 1. Pain Pills: Bring all pain pills to every appointment (except for procedure appointments). 2. Pill Bottles: Bring pills in original pharmacy bottle. Always bring the newest bottle. Bring bottle, even if empty. 3. Medication refills: You are responsible for knowing and keeping track of what medications you take and those you need refilled. The day before your appointment: write a list of all prescriptions that need to be refilled. The day of the appointment: give the list to the admitting nurse. Prescriptions will be written only during appointments. No prescriptions will be written on procedure days. If you forget a medication: it will not be "Called in", "Faxed", or "electronically sent". You will need to get another appointment to get these prescribed. No early refills. Do not call asking to have your prescription filled  early. 4. Prescription Accuracy: You are responsible for carefully inspecting your prescriptions before leaving our office. Have the discharge nurse carefully go over each prescription with you, before taking them home. Make sure that your name is accurately spelled, that your address is correct. Check the name and dose of your medication to make sure it is accurate. Check the number of pills, and the written instructions to make sure they are clear and accurate. Make sure that you are given enough medication to last until your next medication refill appointment. 5. Taking Medication: Take medication as prescribed. When it comes to controlled substances, taking less pills or less frequently than prescribed is permitted and encouraged. Never take more pills than instructed. Never take medication more frequently than prescribed.  6. Inform other Doctors: Always inform, all of your healthcare providers, of all the medications you take. 7. Pain Medication from other Providers: You are not allowed to accept any additional pain medication from any other Doctor or Healthcare provider. There are two exceptions to this rule. (see below) In the event that you require additional pain medication, you are responsible for notifying us, as stated below. 8. Medication Agreement: You are responsible for carefully reading and following our Medication Agreement. This must be signed before receiving any prescriptions from our practice. Safely store a copy of your signed Agreement. Violations to the Agreement will result in no further prescriptions. (Additional copies of our Medication Agreement are available upon request.) 9. Laws, Rules, & Regulations: All patients are expected to follow all Federal and State Laws, Statutes, Rules, & Regulations. Ignorance of the Laws does not constitute a valid excuse. The use of any illegal substances is prohibited. 10. Adopted CDC guidelines & recommendations: Target dosing levels will be  at or below 60 MME/day. Use of benzodiazepines** is not recommended.  Exceptions: There are only two exceptions to the rule of not   receiving pain medications from other Healthcare Providers. 1. Exception #1 (Emergencies): In the event of an emergency (i.e.: accident requiring emergency care), you are allowed to receive additional pain medication. However, you are responsible for: As soon as you are able, call our office [phone], at any time of the day or night, and leave a message stating your name, the date and nature of the emergency, and the name and dose of the medication prescribed. In the event that your call is answered by a member of our staff, make sure to document and save the date, time, and the name of the person that took your information.  2. Exception #2 (Planned Surgery): In the event that you are scheduled by another doctor or dentist to have any type of surgery or procedure, you are allowed (for a period no longer than 30 days), to receive additional pain medication, for the acute post-op pain. However, in this case, you are responsible for picking up a copy of our "Post-op Pain Management for Surgeons" handout, and giving it to your surgeon or dentist. This document is available at our office, and does not require an appointment to obtain it. Simply go to our office during business hours (Monday-Thursday from 8:00 AM to 4:00 PM) (Friday 8:00 AM to 12:00 Noon) or if you have a scheduled appointment with us, prior to your surgery, and ask for it by name. In addition, you will need to provide us with your name, name of your surgeon, type of surgery, and date of procedure or surgery.  *Opioid medications include: morphine, codeine, oxycodone, oxymorphone, hydrocodone, hydromorphone, meperidine, tramadol, tapentadol, buprenorphine, fentanyl, methadone. **Benzodiazepine medications include: diazepam (Valium), alprazolam (Xanax), clonazepam (Klonopine), lorazepam (Ativan), clorazepate  (Tranxene), chlordiazepoxide (Librium), estazolam (Prosom), oxazepam (Serax), temazepam (Restoril), triazolam (Halcion) (Last updated: 03/27/2017) ____________________________________________________________________________________________    

## 2018-06-15 NOTE — Telephone Encounter (Signed)
Patient wants to get his script for percocets to be filled on 06/18/18, filled today, he got 75 filled on 05/19/18. Please let them know if this is ok

## 2018-06-18 MED ORDER — DRONABINOL 10 MG CAPSULE
ORAL_CAPSULE | 0 refills | 0 days | Status: CP
Start: 2018-06-18 — End: 2018-07-27

## 2018-07-07 NOTE — Unmapped (Signed)
[facility] Refill Coordination Note    Specialty Medication(s) to be Shipped:   General Specialty: stelara 90mg /ml    Other medication(s) to be shipped:       Rodney Bender, DOB: 1970/11/09  Phone: [phone] (home)       All above HIPAA information was verified with patient.     Completed refill call assessment today to schedule patient's medication shipment from the [facility] [phone]).       Specialty medication(s) and dose(s) confirmed: Regimen is correct and unchanged.   Changes to medications: Rodney Bender reports no changes at this time.  Changes to insurance: No  Questions for the pharmacist: No    Confirmed patient received Welcome Packet with first shipment. The patient will receive a drug information handout for each medication shipped and additional FDA Medication Guides as required.       DISEASE/MEDICATION-SPECIFIC INFORMATION        N/A    SPECIALTY MEDICATION ADHERENCE     Medication Adherence    Patient reported X missed doses in the last month:  0  Specialty Medication:  stelara 90mg /ml                Stelara 90 mg/ml: 0 days of medicine on hand       SHIPPING     Shipping address confirmed in Epic.     Delivery Scheduled: Yes, Expected medication delivery date: 06/16.     Medication will be delivered via Same Day Courier to the home address in Epic WAM.    Rodney Bender   [facility] Specialty Technician

## 2018-07-14 MED FILL — STELARA 90 MG/ML SUBCUTANEOUS SYRINGE: 56 days supply | Qty: 1 | Fill #5 | Status: AC

## 2018-07-14 MED FILL — STELARA 90 MG/ML SUBCUTANEOUS SYRINGE: SUBCUTANEOUS | 56 days supply | Qty: 1 | Fill #5

## 2018-07-15 ENCOUNTER — Encounter: Payer: Medicare Other | Admitting: Nurse Practitioner

## 2018-07-15 ENCOUNTER — Ambulatory Visit: Payer: Medicare Other | Admitting: Pain Medicine

## 2018-07-27 MED ORDER — DRONABINOL 10 MG CAPSULE
ORAL_CAPSULE | 0 refills | 0 days | Status: CP
Start: 2018-07-27 — End: 2018-08-21

## 2018-08-11 ENCOUNTER — Encounter: Payer: Self-pay | Admitting: Pain Medicine

## 2018-08-11 ENCOUNTER — Telehealth: Payer: Self-pay | Admitting: *Deleted

## 2018-08-11 NOTE — Progress Notes (Signed)
Patient of Dr. Andree Elk, mistakenly entered into my schedule. I did not see the patient.

## 2018-08-11 NOTE — Telephone Encounter (Signed)
Attempted to call for pre appointment assessment. Message left. 

## 2018-08-12 ENCOUNTER — Ambulatory Visit: Payer: Medicare Other | Attending: Pain Medicine | Admitting: Anesthesiology

## 2018-08-12 ENCOUNTER — Other Ambulatory Visit: Payer: Self-pay

## 2018-08-12 ENCOUNTER — Encounter: Payer: Self-pay | Admitting: Anesthesiology

## 2018-08-12 ENCOUNTER — Ambulatory Visit (HOSPITAL_BASED_OUTPATIENT_CLINIC_OR_DEPARTMENT_OTHER): Payer: Medicare Other | Admitting: Pain Medicine

## 2018-08-12 DIAGNOSIS — R109 Unspecified abdominal pain: Secondary | ICD-10-CM

## 2018-08-12 DIAGNOSIS — F1190 Opioid use, unspecified, uncomplicated: Secondary | ICD-10-CM

## 2018-08-12 DIAGNOSIS — G894 Chronic pain syndrome: Secondary | ICD-10-CM | POA: Diagnosis not present

## 2018-08-12 DIAGNOSIS — M545 Low back pain, unspecified: Secondary | ICD-10-CM

## 2018-08-12 DIAGNOSIS — M47816 Spondylosis without myelopathy or radiculopathy, lumbar region: Secondary | ICD-10-CM

## 2018-08-12 DIAGNOSIS — K5090 Crohn's disease, unspecified, without complications: Secondary | ICD-10-CM

## 2018-08-12 DIAGNOSIS — M549 Dorsalgia, unspecified: Secondary | ICD-10-CM

## 2018-08-12 DIAGNOSIS — M542 Cervicalgia: Secondary | ICD-10-CM | POA: Diagnosis not present

## 2018-08-12 DIAGNOSIS — M5020 Other cervical disc displacement, unspecified cervical region: Secondary | ICD-10-CM

## 2018-08-12 DIAGNOSIS — Z79891 Long term (current) use of opiate analgesic: Secondary | ICD-10-CM

## 2018-08-12 DIAGNOSIS — G8929 Other chronic pain: Secondary | ICD-10-CM

## 2018-08-12 MED ORDER — OXYCODONE-ACETAMINOPHEN 10-325 MG PO TABS: 1.0000 | ORAL_TABLET | Freq: Three times a day (TID) | ORAL | 0 refills | Status: AC | PRN

## 2018-08-12 NOTE — Progress Notes (Signed)
Virtual Visit via Telephone Note  I connected with [name] on 08/12/18 at  2:45 PM EDT by telephone and verified that I am speaking with the correct person using two identifiers.  Location: Patient: Home Provider: Pain control center   I discussed the limitations, risks, security and privacy concerns of performing an evaluation and management service by telephone and the availability of in person appointments. I also discussed with the patient that there may be a patient responsible charge related to this service. The patient expressed understanding and agreed to proceed.   History of Present Illness: I spoke with [name] today via telephone as he was unable to do video virtual conferencing.  With the COVID crisis he has had to stay in more and has had more abdominal pain and back and neck pain.  This has been stable in nature with no significant changes.  He has been taking the Percocet 10 325 tablets approximately 2 or 3 times a day and averaging 75/month at max.  Based on the practitioner database information it appears that he has been able to go some days with fewer tablets.  He uses them only when he is in pain.  He denies any diverting or illicit use.  The quality characteristic distribution of his abdominal pain is been stable in nature.  He continues to follow-up with his GI doctors as well.  Otherwise he is in his usual state of health.    Observations/Objective:  Current Outpatient Medications:  .  amlodipine-atorvastatin (CADUET) 10-10 MG tablet, Take 1 tablet by mouth daily., Disp: , Rfl:  .  dronabinol (MARINOL) 10 MG capsule, Take 10 mg by mouth 2 (two) times daily before a meal., Disp: , Rfl:  .  famotidine (PEPCID) 20 MG tablet, Take 20 mg by mouth 2 (two) times daily., Disp: , Rfl:  .  gabapentin (NEURONTIN) 100 MG capsule, Take 1 capsule (100 mg total) by mouth at bedtime., Disp: 30 capsule, Rfl: 1 .  hyoscyamine (LEVSIN, ANASPAZ) 0.125 MG tablet, Take 0.125 mg by  mouth as needed. , Disp: , Rfl:  .  naloxone (NARCAN) nasal spray 4 mg/0.1 mL, For respiratory depression from opioids, Disp: 1 kit, Rfl: 2 .  oxyCODONE-acetaminophen (PERCOCET) 10-325 MG tablet, Take 1 tablet by mouth 3 (three) times daily as needed for pain., Disp: 75 tablet, Rfl: 0 .  [START ON 09/11/2018] oxyCODONE-acetaminophen (PERCOCET) 10-325 MG tablet, Take 1 tablet by mouth every 8 (eight) hours as needed for pain., Disp: 75 tablet, Rfl: 0 .  prochlorperazine (COMPAZINE) 5 MG tablet, Take 5 mg by mouth every 8 (eight) hours as needed., Disp: , Rfl:  .  STELARA 90 MG/ML SOSY injection, Inject 90 mg as directed., Disp: , Rfl:    Assessment and Plan: 1. Chronic abdominal pain   2. Chronic pain syndrome   3. Crohn's disease without complication, unspecified gastrointestinal tract location ([place])   4. Cervicalgia   5. HNP (herniated nucleus pulposus), cervical   6. Chronic, continuous use of opioids   7. Facet arthritis of lumbar region   8. Chronic bilateral low back pain without sciatica   9. Chronic neck and back pain   10. Long term current use of opiate analgesic   Based on our discussion today and upon review of the [place] practitioner database information I am going to refill his medications for July 15 and August 14.  On return to clinic in 2 months for standard review of medications.  At this point  he is not a candidate for any interventional therapy.  He continues to do well with his current medication management.  I want him to continue follow-up with his GI doctors and his medical doctors for his baseline medical care.  Follow Up Instructions:    I discussed the assessment and treatment plan with the patient. The patient was provided an opportunity to ask questions and all were answered. The patient agreed with the plan and demonstrated an understanding of the instructions.   The patient was advised to call back or seek an in-person evaluation if the symptoms worsen  or if the condition fails to improve as anticipated.  I provided 30 minutes of non-face-to-face time during this encounter.   [name], MD

## 2018-08-13 DIAGNOSIS — G8929 Other chronic pain: Secondary | ICD-10-CM | POA: Insufficient documentation

## 2018-08-21 MED ORDER — DRONABINOL 10 MG CAPSULE: capsule | 0 refills | 0 days

## 2018-08-21 MED ORDER — DRONABINOL 10 MG CAPSULE: 10 mg | capsule | Freq: Two times a day (BID) | 0 refills | 30 days | Status: AC

## 2018-08-21 MED ORDER — DRONABINOL 10 MG CAPSULE
ORAL_CAPSULE | Freq: Two times a day (BID) | ORAL | 0 refills | 30.00000 days | Status: CP
Start: 2018-08-21 — End: 2018-09-24

## 2018-08-21 NOTE — Unmapped (Signed)
Addended by: Hunt Oris on: 08/21/2018 11:28 AM     Modules accepted: Orders

## 2018-09-01 MED ORDER — USTEKINUMAB 90 MG/ML SUBCUTANEOUS SYRINGE
SUBCUTANEOUS | 5 refills | 56.00000 days | Status: CP
Start: 2018-09-01 — End: ?
  Filled 2018-09-07: qty 1, 56d supply, fill #0

## 2018-09-01 NOTE — Unmapped (Signed)
[facility] Clinical Assessment & Refill Coordination Note    Zev Blue [place], [place]: [birth year]  Phone: [phone] (home)     All above HIPAA information was verified with patient.     Specialty Medication(s):   Inflammatory Disorders: Stelara     Current Outpatient Medications   Medication Sig Dispense Refill   ??? busPIRone (BUSPAR) 10 MG tablet Take 10 mg by mouth Two (2) times a day. Takes as needed     ??? citalopram (CELEXA) 20 MG tablet Take 1 tablet (20 mg total) by mouth daily. (Patient taking differently: Take 20 mg by mouth daily. Takes as needed) 90 tablet 3   ??? dronabinoL (MARINOL) 10 MG capsule Take 1 capsule (10 mg total) by mouth Two (2) times a day (30 minutes before a meal). 60 capsule 0   ??? gabapentin (NEURONTIN) 300 MG capsule Take 300 mg by mouth Three (3) times a day. Takes as needed     ??? hydroxyzine (ATARAX) 25 MG tablet Take 1 tablet (25 mg total) by mouth every six (6) hours as needed. 90 capsule 3   ??? 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 oxyCODONE-acetaminophen (PERCOCET) 10-325 mg per tablet Take 1 tablet by mouth every eight (8) hours as needed for pain.      ??? prochlorperazine (COMPAZINE) 5 MG tablet Take 1-2 tablets (5-10 mg total) by mouth every eight (8) hours as needed for nausea (vomiting). 60 tablet 5   ??? sildenafil (VIAGRA) 50 MG tablet Take 50 mg by mouth daily as needed for erectile dysfunction.     ??? ustekinumab (STELARA) 90 mg/mL Syrg syringe Inject 1 mL (90 mg total) under the skin every 8 weeks. 1 mL 5   ??? vancomycin (VANCOCIN) 250 MG capsule Take according to prescribed taper (this is the remaining quantity to supplement the fill on 11/26) (Patient not taking: Reported on 04/06/2018) 23 capsule 0     No current facility-administered medications for this visit.         Changes to medications: Royale reports no changes at this time.    Allergies   Allergen Reactions   ??? Dilaudid [Hydromorphone] Anxiety   ??? Morphine Itching   ??? Opioids - Morphine Analogues Itching   ??? Tramadol Itching and Anxiety   ??? Vicodin [Hydrocodone-Acetaminophen] Anxiety       Changes to allergies: No    SPECIALTY MEDICATION ADHERENCE     Stelara 90 mg/ml: 0 days of medicine on hand     Specialty medication(s) dose(s) confirmed: Regimen is correct and unchanged.     Are there any concerns with adherence? No    Adherence counseling provided? Not needed    CLINICAL MANAGEMENT AND INTERVENTION      Clinical Benefit Assessment:    Do you feel the medicine is effective or helping your condition? Yes    Clinical Benefit counseling provided? Progress note from 03/27/18 shows evidence of clinical benefit    Adverse Effects Assessment:    Are you experiencing any side effects? No    Are you experiencing difficulty administering your medicine? No    Quality of Life Assessment:    How many days over the past month did your Crohn's disease  keep you from your normal activities? For example, brushing your teeth or getting up in the morning. 0    Have you discussed this with your provider? Not needed    Therapy Appropriateness:  Is therapy appropriate? Yes, therapy is appropriate and should be continued    DISEASE/MEDICATION-SPECIFIC INFORMATION      For patients on injectable medications: Patient currently has 0 doses left.  Next injection is scheduled for 09/10/18.    PATIENT SPECIFIC NEEDS     ? Does the patient have any physical, cognitive, or cultural barriers? No    ? Is the patient high risk? No     ? Does the patient require a Care Management Plan? No     ? Does the patient require physician intervention or other additional services (i.e. nutrition, smoking cessation, social work)? No      SHIPPING     Specialty Medication(s) to be Shipped:   Inflammatory Disorders: Stelara    Other medication(s) to be shipped: none     Changes to insurance: No    Delivery Scheduled: Yes, Expected medication delivery date: 09/07/18.  However, Rx request for refills was sent to the provider as there are none remaining.     Medication will be delivered via Same Day Courier to the confirmed home address in [place] [facility].    The patient will receive a drug information handout for each medication shipped and additional FDA Medication Guides as required.  Verified that patient has previously received a Conservation officer, historic buildings.    All of the patient's questions and concerns have been addressed.    [name] [facility] Specialty Pharmacist

## 2018-09-07 MED FILL — STELARA 90 MG/ML SUBCUTANEOUS SYRINGE: 56 days supply | Qty: 1 | Fill #0 | Status: AC

## 2018-09-16 NOTE — Unmapped (Signed)
Mr. Dyar asked if we could please help him with contact information for his ortho surgeon. He saw them a while ago and needs to follow up but does not have number. Gave info for Dr. Wilnette Kales office and ortho scheduling.     Also needs to make a f/u appt with Dr. Elizebeth Brooking. Gave scheduling number. To call with problems.

## 2018-09-21 ENCOUNTER — Ambulatory Visit: Admit: 2018-09-21 | Discharge: 2018-09-22 | Payer: MEDICARE

## 2018-09-21 DIAGNOSIS — G8929 Other chronic pain: Secondary | ICD-10-CM

## 2018-09-21 DIAGNOSIS — M25511 Pain in right shoulder: Principal | ICD-10-CM

## 2018-09-21 NOTE — Unmapped (Signed)
I saw and evaluated the patient, participating in the key portions of the service.  I reviewed the resident???s note.  I agree with the resident???s findings and plan. Cruz Condon, MD

## 2018-09-21 NOTE — Unmapped (Signed)
ORTHOPAEDIC CLINIC NOTE  Date: 09/21/2018   Attending: Esmeralda Links, MD  [facility]    ASSESSMENT:  Rodney Bender is a 48 y.o. male with the following visit diagnoses:    Chronic right shoulder pain with possible supraspinatus tear and known history cervical radiculopathy/ACDF.    PLAN:    We had an extensive discussion with patient today.  He has failed all nonoperative management and he has significant pain.  He has seen a spine surgeon who told him he is not a candidate for any spine surgery.  He tried physical therapy and injections without any relief.  He is currently on chronic pain medication.  We explained to him the problem with surgery is that he may have a small rotator cuff tear, but we are concerned that just fixing this rotator cuff tear with not lead to a full improvement of his symptoms.  He does understand this, but given his significant pain and failure of other nonoperative measures he is very interested in proceeding with surgery.  We explained to him that we will plan to do a diagnostic arthroscopy with decompression and if we see a rotator cuff tear, fix it at that time.  The major risk is that he may have persistent pain given his diffuse pain as well as prior cervical spine issues.  He understands this and would like to proceed.  Informed consent was obtained for a right shoulder diagnostic arthroscopy with subacromial decompression and possible rotator cuff repair.  Other the risks of surgery including but not limited to infection, blood loss, neurovascular injury, blood clot, re-tear, and persistent pain were discussed.    He sees a chronic pain physician and we explained to him that we would provide him with one postoperative narcotic prescription for pain control and the rest will have to be filled by his pain physician.    I discussed risks and benefits of shoulder surgery with the patient.  We discussed risks including but not limited to infection, injury to nerves or blood vessels, failure of the repair or reconstruction, the need for post-operative care including immobilization and substantial rehab and the possible need for future treatment including additional surgery. After reviewing the risks and benefits of surgical intervention the patient and/or family member gave informed consent and elected to proceed.      JTS-     SUBJECTIVE:  Chief complaint: right shoulder pain    History of Present Illness:   48 y.o. male who returns for evaluation of his right shoulder.  He has been having shoulder pain for about 2 years now.  States it is extremely severe now.  It is a combined pain that radiates from his neck down through his arm.  He does get numbness and tingling in his fingers.  He said he was evaluated and told that he is not a candidate for any specific spine surgery given the appearance of his MRI.  He describes pain with any activity including lifting or moving the arm.  He also has sharp pain at night.  He states he has tried a steroid injection to his shoulder over 6 months ago and did not help.  He has tried physical therapy with no relief.  He takes Percocet for his pain provided to him by his pain doctor. He stopped smoking 15 months ago.     Laterality: right  Pain Assessment: 0-10  0-10 Pain Scale: 9    OBJECTIVE:  EXPANDED PHYSICAL EXAM (6 Point)  General Appearance ??  well-nourished and no acute distress, Estimated body mass index is 18.27 kg/m?? as calculated from the following:  ??   Height as of 04/06/18: 180.3 cm (5' 11).  ??   Weight as of 04/06/18: 59.4 kg (131 lb).   Mood and Affect ?? alert, cooperative and pleasant   Gait and Station ?? Gait is non-antalgic   Cardiovascular ?? well-perfused distally and no swelling   Sensation ?? sensation to light touch distally normal   MUSCULOSKELETAL    Right upper extremity   ?? Diffusely tender neck, anterior, lateral, posterior shoulder  ?? Passive forward elevation to 150 degrees, painful at extreme of motion  ??  ER 30, IR T12  ?? 4+/5 supraspinatus limited by pain  ?? Positive Neer's        Test Results  Imaging  No new films today.      No orders of the defined types were placed in this encounter.    Requested Prescriptions      No prescriptions requested or ordered in this encounter

## 2018-09-21 NOTE — Unmapped (Addendum)
PRE-SURGICAL INFORMATION & CHECKLIST    Your surgery has been planned for: 10/02/2018      You will receive a phone call from one of our [facility] surgical schedulers within 1-2 weeks following your office visit. The scheduler will review and confirm the tentative surgery date. Please note that although we attempt to accommodate our patient schedules, we are also required to work within hospital scheduling guidelines.Please be sure to let us know of any special needs you may have. If you have any questions or concerns, please do not hesitate to contact Sonja directly at [phone].    THE WEEK BEFORE YOUR SURGERY    1. You will bleed less during surgery if you stop taking anti-inflammatory medications at least one week before surgery.  These include ASPIRIN, IBUPROFEN (Motrin, Advil), NAPROXYN (Naprosyn), NAPROXEN SODIUM (Aleve), INDOMETHACIN (Indocin), CELEBREX and several other prescription anti-inflammatories commonly prescribed for arthritis or other musculoskeletal conditions. Discontinue FISH OIL supplements as well.  Ask your doctor before discontinuing any medication.   2. If you normally shave the area of surgery, please stop shaving one week (or more) before surgery. Germs grow in small razor nicks from shaving and will increase the risk of infection.     THE LAST BUSINESS DAY BEFORE YOUR SURGERY    1. Surgery times are given out from 2:00-5:00 p.m. on the last business day before scheduled surgery. If your surgery is scheduled at the [facility] you may call the [facility]  Procedure Center at [phone].  If your surgery is scheduled at the [facility] ( [facility]) , you may call [phone]. For the [facility], the phone number is: [phone].  2. If you will not be staying at your home address the night before surgery, please call the Ambulatory Procedure Center to let them know where you can be reached.     THE NIGHT BEFORE YOUR SURGERY    1. DO NOT EAT OR DRINK ANYTHING AFTER MIDNIGHT, unless instructed otherwise.  2. Take your usual medications as prescribed, unless instructed otherwise.   3. Shower with antibacterial soap the night before and the morning of surgery.     THE MORNING OF YOUR SURGERY     1. Take a shower or bath and wash the surgical area with the antibacterial soap you have been given in the clinic.   2. Remember to bring any crutches, braces or other equipment that have been supplied for use immediately following your surgery.   3. You may take your usual medications as prescribed with a small sip of water, unless instructed otherwise.  Do not drink a full glass of water.      A RESPONSIBLE ADULT (OVER 18) MUST ACCOMPANY YOU ON THE DAY OF SURGERY AND BE AVAILABLE THROUGHOUT YOUR PROCEDURE IN THE EVENT THERE ARE QUESTIONS OR COMPLICATIONS.  You must have an adult available to take you home following your procedure as it will not be safe for you to drive or take public transportation alone.          [facility] [place] [facility] ? [phone] ? [place], [place] [address] Phone [phone] ? Fax [phone]

## 2018-09-21 NOTE — Unmapped (Addendum)
ORTHOPAEDIC CLINIC NOTE  Date: 09/21/2018   Attending: Esmeralda Links, MD  [facility]    ASSESSMENT:  Rodney Bender is a 48 y.o. male with the following visit diagnoses:    Chronic right shoulder pain with possible supraspinatus tear and known history cervical radiculopathy/ACDF.    PLAN:    We had an extensive discussion with patient today.  He has failed all nonoperative management and he has significant pain.  He has seen a spine surgeon who told him he is not a candidate for any spine surgery.  He tried physical therapy and injections without any relief.  He is currently on chronic pain medication.  We explained to him the problem with surgery is that he may have a small rotator cuff tear, but we are concerned that just fixing this rotator cuff tear with not lead to a full improvement of his symptoms.  He does understand this, but given his significant pain and failure of other nonoperative measures he is very interested in proceeding with surgery.  We explained to him that we will plan to do a diagnostic arthroscopy with decompression and if we see a rotator cuff tear, fix it at that time.  The major risk is that he may have persistent pain given his diffuse pain as well as prior cervical spine issues.  He understands this and would like to proceed.  Informed consent was obtained for a right shoulder diagnostic arthroscopy with subacromial decompression and possible rotator cuff repair.  Other the risks of surgery including but not limited to infection, blood loss, neurovascular injury, blood clot, re-tear, and persistent pain were discussed.    He sees a chronic pain physician and we explained to him that we would provide him with one postoperative narcotic prescription for pain control and the rest will have to be filled by his pain physician.    I discussed risks and benefits of shoulder surgery with the patient.  We discussed risks including but not limited to infection, injury to nerves or blood vessels, failure of the repair or reconstruction, the need for post-operative care including immobilization and substantial rehab and the possible need for future treatment including additional surgery. After reviewing the risks and benefits of surgical intervention the patient and/or family member gave informed consent and elected to proceed.      JTS-     SUBJECTIVE:  Chief complaint: right shoulder pain    History of Present Illness:   48 y.o. male who returns for evaluation of his right shoulder.  He has been having shoulder pain for about 2 years now.  States it is extremely severe now.  It is a combined pain that radiates from his neck down through his arm.  He does get numbness and tingling in his fingers.  He said he was evaluated and told that he is not a candidate for any specific spine surgery given the appearance of his MRI.  He describes pain with any activity including lifting or moving the arm.  He also has sharp pain at night.  He states he has tried a steroid injection to his shoulder over 6 months ago and did not help.  He has tried physical therapy with no relief.  He takes Percocet for his pain provided to him by his pain doctor. He stopped smoking 15 months ago.     Laterality: right  Pain Assessment: 0-10  0-10 Pain Scale: 9    OBJECTIVE:  EXPANDED PHYSICAL EXAM (6 Point)  General Appearance ??  well-nourished and no acute distress, Estimated body mass index is 18.27 kg/m?? as calculated from the following:  ??   Height as of 04/06/18: 180.3 cm (5' 11).  ??   Weight as of 04/06/18: 59.4 kg (131 lb).   Mood and Affect ?? alert, cooperative and pleasant   Gait and Station ?? Gait is non-antalgic   Cardiovascular ?? well-perfused distally and no swelling   Sensation ?? sensation to light touch distally normal   MUSCULOSKELETAL    Right upper extremity   ?? Diffusely tender neck, anterior, lateral, posterior shoulder  ?? Passive forward elevation to 150 degrees, painful at extreme of motion  ??  ER 30, IR T12  ?? 4+/5 supraspinatus limited by pain  ?? Positive Neer's        Test Results  Imaging  No new films today.      No orders of the defined types were placed in this encounter.    Requested Prescriptions      No prescriptions requested or ordered in this encounter

## 2018-09-24 MED ORDER — DRONABINOL 10 MG CAPSULE
ORAL_CAPSULE | 0 refills | 0 days | Status: CP
Start: 2018-09-24 — End: ?

## 2018-09-30 ENCOUNTER — Ambulatory Visit: Admit: 2018-09-30 | Discharge: 2018-10-01 | Payer: MEDICARE | Attending: Family | Primary: Family

## 2018-09-30 DIAGNOSIS — Z01812 Encounter for preprocedural laboratory examination: Secondary | ICD-10-CM

## 2018-09-30 DIAGNOSIS — Z1159 Encounter for screening for other viral diseases: Secondary | ICD-10-CM

## 2018-09-30 DIAGNOSIS — Z20828 Contact with and (suspected) exposure to other viral communicable diseases: Secondary | ICD-10-CM

## 2018-09-30 NOTE — Unmapped (Signed)
COVID Pre-Procedure Intake Form     Assessment     Rodney Bender is a 48 y.o. male presenting to [facility] for COVID testing.     Plan     If no testing performed, pt counseled on routine care for respiratory illness.  If testing performed, COVID sent.  Patient directed to Home given findings during today's visit.    Subjective     Rodney Bender is a 48 y.o. male who presents to the [facility] with complaints of the following:    Exposure History: In the last 21 days?     Have you traveled outside of [place]? No               Have you been in close contact with someone confirmed by a test to have COVID? (Close contact is within 6 feet for at least 10 minutes) No       Have you worked in a health care facility? No     Lived or worked facility like a nursing home, group home, or assisted living?    No         Are you scheduled to have surgery or a procedure in the next 3 days? Yes               Are you scheduled to receive cancer chemotherapy within the next 7 days?    No     Have you ever been tested before for COVID-19 with a swab of your nose? No   Are you a healthcare worker being tested so to return to work No     Right now,  do you have any of the following that developed over the past 7 days (as stated by patient on intake form):    Subjective fever (felt feverish) No   Chills (especially repeated shaking chills) No   Severe fatigue (felt very tired) No   Muscle aches No   Runny nose No   Sore throat No   Loss of taste or smell No   Cough (new onset or worsening of chronic cough) No   Shortness of breath No   Nausea or vomiting No   Headache No   Abdominal Pain No   Diarrhea (3 or more loose stools in last 24 hours) No       Scribe's Attestation: Paulita Fujita, FNP obtained and performed the history, physical exam and medical decision making elements that were entered into the chart.  Signed by Alleen Borne serving as Scribe, on 09/30/2018 10:54 AM      The documentation recorded by the scribe accurately reflects the service I personally performed and the decisions made by me. Aida Puffer, FNP  September 30, 2018 11:34 AM

## 2018-10-01 DIAGNOSIS — M75111 Incomplete rotator cuff tear or rupture of right shoulder, not specified as traumatic: Secondary | ICD-10-CM

## 2018-10-01 DIAGNOSIS — Z981 Arthrodesis status: Secondary | ICD-10-CM

## 2018-10-01 DIAGNOSIS — K219 Gastro-esophageal reflux disease without esophagitis: Secondary | ICD-10-CM

## 2018-10-01 DIAGNOSIS — M75101 Unspecified rotator cuff tear or rupture of right shoulder, not specified as traumatic: Secondary | ICD-10-CM

## 2018-10-01 DIAGNOSIS — K5090 Crohn's disease, unspecified, without complications: Secondary | ICD-10-CM

## 2018-10-01 DIAGNOSIS — G8929 Other chronic pain: Secondary | ICD-10-CM

## 2018-10-01 DIAGNOSIS — Z885 Allergy status to narcotic agent status: Secondary | ICD-10-CM

## 2018-10-01 DIAGNOSIS — Z79899 Other long term (current) drug therapy: Secondary | ICD-10-CM

## 2018-10-01 DIAGNOSIS — Z87891 Personal history of nicotine dependence: Secondary | ICD-10-CM

## 2018-10-01 DIAGNOSIS — M779 Enthesopathy, unspecified: Secondary | ICD-10-CM

## 2018-10-02 ENCOUNTER — Ambulatory Visit: Admit: 2018-10-02 | Discharge: 2018-10-02 | Payer: MEDICARE

## 2018-10-02 ENCOUNTER — Encounter: Admit: 2018-10-02 | Discharge: 2018-10-02 | Payer: MEDICARE

## 2018-10-02 DIAGNOSIS — Z981 Arthrodesis status: Secondary | ICD-10-CM

## 2018-10-02 DIAGNOSIS — M75111 Incomplete rotator cuff tear or rupture of right shoulder, not specified as traumatic: Secondary | ICD-10-CM

## 2018-10-02 DIAGNOSIS — M75101 Unspecified rotator cuff tear or rupture of right shoulder, not specified as traumatic: Secondary | ICD-10-CM

## 2018-10-02 DIAGNOSIS — K5090 Crohn's disease, unspecified, without complications: Secondary | ICD-10-CM

## 2018-10-02 DIAGNOSIS — M779 Enthesopathy, unspecified: Secondary | ICD-10-CM

## 2018-10-02 DIAGNOSIS — G8929 Other chronic pain: Secondary | ICD-10-CM

## 2018-10-02 DIAGNOSIS — K219 Gastro-esophageal reflux disease without esophagitis: Secondary | ICD-10-CM

## 2018-10-02 DIAGNOSIS — Z79899 Other long term (current) drug therapy: Secondary | ICD-10-CM

## 2018-10-02 DIAGNOSIS — Z87891 Personal history of nicotine dependence: Secondary | ICD-10-CM

## 2018-10-02 DIAGNOSIS — Z885 Allergy status to narcotic agent status: Secondary | ICD-10-CM

## 2018-10-02 MED ORDER — OXYCODONE 5 MG TABLET
ORAL_TABLET | Freq: Four times a day (QID) | ORAL | 0 refills | 5.00000 days | Status: CP | PRN
Start: 2018-10-02 — End: 2018-10-12
  Filled 2018-10-02: qty 40, 5d supply, fill #0

## 2018-10-02 MED ORDER — GABAPENTIN 100 MG CAPSULE
ORAL_CAPSULE | Freq: Three times a day (TID) | ORAL | 0 refills | 5.00000 days | Status: CP
Start: 2018-10-02 — End: 2018-10-07
  Filled 2018-10-02: qty 15, 5d supply, fill #0

## 2018-10-02 MED ORDER — ACETAMINOPHEN 500 MG TABLET
ORAL_TABLET | Freq: Three times a day (TID) | ORAL | 1 refills | 7.00000 days | Status: CP
Start: 2018-10-02 — End: 2018-10-09

## 2018-10-02 MED ORDER — DOCUSATE SODIUM 100 MG CAPSULE
ORAL_CAPSULE | Freq: Two times a day (BID) | ORAL | 0 refills | 14.00000 days | Status: CP
Start: 2018-10-02 — End: 2018-10-16
  Filled 2018-10-02: qty 28, 14d supply, fill #0

## 2018-10-02 MED ORDER — ONDANSETRON 4 MG DISINTEGRATING TABLET
ORAL_TABLET | Freq: Four times a day (QID) | ORAL | 0 refills | 3.00000 days | Status: CP | PRN
Start: 2018-10-02 — End: ?
  Filled 2018-10-02: qty 10, 3d supply, fill #0

## 2018-10-02 MED ORDER — ASPIRIN 81 MG TABLET,DELAYED RELEASE
ORAL_TABLET | Freq: Every day | ORAL | 0 refills | 30 days | Status: CP
Start: 2018-10-02 — End: 2018-11-01
  Filled 2018-10-02: qty 30, 30d supply, fill #0
  Filled 2018-10-02: qty 42, 7d supply, fill #0

## 2018-10-02 MED FILL — OXYCODONE 5 MG TABLET: 5 days supply | Qty: 40 | Fill #0 | Status: AC

## 2018-10-02 MED FILL — GABAPENTIN 100 MG CAPSULE: 5 days supply | Qty: 15 | Fill #0 | Status: AC

## 2018-10-02 MED FILL — ACETAMINOPHEN 500 MG TABLET: 7 days supply | Qty: 42 | Fill #0 | Status: AC

## 2018-10-02 MED FILL — ONDANSETRON 4 MG DISINTEGRATING TABLET: 3 days supply | Qty: 10 | Fill #0 | Status: AC

## 2018-10-02 MED FILL — ASPIRIN 81 MG TABLET,DELAYED RELEASE: 30 days supply | Qty: 30 | Fill #0 | Status: AC

## 2018-10-02 MED FILL — DOK 100 MG CAPSULE: 14 days supply | Qty: 28 | Fill #0 | Status: AC

## 2018-10-02 NOTE — Unmapped (Signed)
Ambulatory Surgery Nerve Block Instructions and FAQs    As part of your care today, you have received a regional anesthetic (nerve block) to assist with the management of moderate to severe pain after your procedure. As has been explained to you or your responsible person, this procedure was performed to ensure the highest possible degree of comfort and satisfaction, and toreduce the need for opioid pain medications after your surgery at [facility]. Below you will find a list of instructions to help guide you through the next few days with your nerve block, and telephone numbers that you may call at any time for further assistance.    What you need to know about your nerve block:    1. If you have pain, you may take your prescription pain medicine as directed. It will not interact with the medicine administered through your nerve block. The pain pills and the nerve block will work effectively together to reduce your pain.  2. Take your prescribed pain medicine as soon as you start feeling any discomfort in the extremity.  3. Avoid placing cold, hot, hard or sharp items on your numb body part.  Also be careful to not rest your extremity on hard or hot/cold surfaces.  4. If you received a cooling device after surgery it is okay to use it.  5. Be careful not to bang or bump the numb body part. Try to keep it in a neutral, comfortable position.  6. Elevate the limb; this will help decrease swelling and help increase comfort when the block wears off.  7. You may regain some movement or feeling, or experience some breakthrough pain, when the initial strong numbing medicine wears off. This is normal and expected.  8. If you experience any of the following symptoms, please call the number(s) listed below for your anesthesiologist:  o Ringing in the ears  o Lightheadedness  o Numbness or tingling around the lips    If you had an upper extremity nerve block:  1. Be aware of the position of your arm and protect it from compression or other injury.  2. Ensure that your entire arm is supported, including the wrist.  3. Do not let the wrist dangle over the sling.  4. There are some possible side effects that you may experience with your particular block. These side effects will resolve as your block wears off. They include:  ?? hoarse voice  ?? slight redness of the eye  ?? smaller pupil  ?? sagging eyelid  ?? mild shortness of breath (especially if lying down)    If you had a lower extremity nerve block:  1. Be aware of the position of your leg and protect it from compression or other injury.  2. Pad the knee, ankle and heel, and keep the foot and ankle elevated if possible.  3. Do NOT bear any weight on the numb extremity until you completely recover the feeling and full muscle strength.  4. Always seek assistance when getting up and moving about.    Numbers to call for help:    If you have any questiosn about your nerve block or your pain control, if you have any of the symptoms listed above, or if you are so directed by another health care provider, please call or text the physician(s) who placed your nerve block, 24 hours a day at the following numbers:    Resident:   Clair Gulling, MD Phone: [phone]  Attending: Dr. Nyra Jabs    WARNING    As part of your nerve block, you have received liposomal bupivacaine, a very long-acting local anesthetic for pain relief. You must not receive any other local anesthetics containing bupivacaine for 96 hours after administration of liposomal bupivacaine. You should also avoid using other types of lidocaine-containing topical anesthetics (ointments, lidocaine patches, gels or sprays) over the site of the nerve block for 96 hours after administration. The date and time of administration are listed below.       Surgery date: 9/4  Time of liposomal bupivacaine administration: 1415  Site of administration: Rt interscalene brachial plexus

## 2018-10-02 NOTE — Unmapped (Signed)
Orthopaedic Surgery Operative Note (CSN: [id number])  Date of Surgery: 10/02/2018  Attending Physician: Esmeralda Links, MD    Diagnoses:   Pre-Op Diagnoses: right shoulder rotator cuff tear- small M75.101    Post-Op Diagnosis: Same with a subacromial spur    Procedure(s)   Procedure:    R16 ARTHROSCOPY, SHOULDER, SURGICAL; DECOMPRESS SUBACROMIAL SPACE W/PART ACROMIOPLASTY, W/CORACOACROMIAL RELEAS  CPT(R) Code:  84132 - PR SHLDR ARTHROSCOP,PART ACROMIOPLAS    Procedure:    ARTHROSCOPY, SHOULDER, SURGICAL; WITH ROTATOR CUFF REPAIR  CPT(R) Code:  44010 - PR SHLDR ARTHROSCOP,SURG,W/ROTAT CUFF REPR       Surgeons:  Primary: Gonzella Lex, MD  Resident - Assisting: Mila Palmer, MD    Location: ASC OR [facility]  Anesthesia: General  Antibiotics: Ancef 2g  Tourniquet time: * No tourniquets in log *  Estimated Blood Loss: 10 mL  Complications: None  Specimens: None  Implants:  Implant Name Type Inv. Item Serial No. Manufacturer Lot No. LRB No. Used Action   ANCHOR HEALIX 4.5MM W/ORTHOCORD - [id number] Screws Pins Plates ANCHOR HEALIX 4.5MM W/ORTHOCORD  [employer] [id number] Right 1 Implanted       Indications for Surgery:    Rodney Bender is a 48 y.o. male with right shoulder rotator cuff tear- small M75.101 and subacromial impingement.  Benefits and risks of operative and nonoperative management were discussed prior to surgery and informed consent form was completed.       Operative Findings:  Successful completion of the above procedure(s).    Operative shoulder EUA/ROM: 45 degrees of external rotation, 160 degrees forward elevation  Glenohumeral compartment: healthy-appearing articular cartilage on the glenoid and humerus  Root of biceps/Superior Labrum: Cordlike [facility] originating from the superior labrum with absence of the anterior superior labrum, nontraumatic in appearance (Buford complex)  Biceps Tendon: Somewhat abnormal anatomy due to Buford complex but no tendinosis or intrasubstance tearing Rotator cuff: Deep partial-thickness tear of the supraspinatus at its insertion originating from the articular side with some fraying surrounding this, fraying in this region on the bursal side as well intact posterior cuff and subscapularis      Procedure:   The patient was identified in the preoperative holding area where the surgical site was marked with indelible ink. The patient was identified and brought back to the operating room, where after a preprocedural timeout identify the correct patient, site, and procedure to be performed, the patient was transferred over to the operative table in the beachchair position on a tennant table   The patient received preoperative antibiotics per protocol.  Anesthesia was induced.  The extremity was prepped and draped in the usual sterile fashion.      A final timeout was then called.      We made a posterior portal and inserted the arthroscope into the glenohumeral joint.  Findings as noted above.  The patient was noted to have a Buford complex as described above but no biceps pathology, the biceps tendon was left intact.  We used needle localization to create an anterior portal.   We probed the top of the labrum  We used a shaver to debride in the rotator interval.  Using a spinal needle we passed a PDS suture from outside in through the partial-thickness cuff tear to later identified this area on the bursal side.    We then withdrew the arthroscope and using the posterior portal inserted it into the sub-acromial space. We used needle localization to create a lateral portal.  We inserted the motorized shaver and the electrocautery to perform a bursectomy to improve visualization.     We then moved the arthroscope to the lateral position.  We used needle localization to create an anterior-lateral portal and placed a cannula there.  We also placed cannulas in the posterior and anterior portals.     Using shaver followed by electrocautery and finally motorized bur we performed a subacromial decompression and removal of a far lateral subacromial spur.    We used a shaver followed by electrocautery and a motorized burr to debride down to bleeding bone and the very small footprint of the supraspinatus tear.  Anchors were inserted a single anchor in the footprint of the supraspinatus tear.    Using a spectrum suture passer and a shuttle technique we passed two horizontal mattress and 1 simple sutures from the posterior to the anterior, taking care to get full thickness passages of the tendon.  We then tied first the horizontal mattress followed by the simple suture to achieve a modified Mason-Allen configuration.    We finally turned off the inflow and immediately noted punctate bleeding from the decorticated footprint immediately beneath our supraspinatus repair.    The wound(s) was thoroughly irrigated.  The wounds were closed with 3-0 prolene suture in the skin.   A sterile dressing was applied along with a sling and a cooling type device.  The patient was awoken from general anesthesia and taken to the PACU in stable condition without complication.     Post-op Plan/Instructions:     The patient will be discharged home today.  See discharge instructions provided by Dr. Ivin Poot for information on weight bearing status, activity restrictions, and therapy plan.  Pain will be controlled with PRN???s. Follow-up in clinic in 10-14 days for planned suture removal.      Xrays at next clinic visit: none    Surgeon Notes: I was present and active for the entire procedure.    Date: 10/02/2018  Time: 4:03 PM

## 2018-10-02 NOTE — Unmapped (Signed)
Brief Operative Note  (CSN: [id number])      Date of Surgery: 10/02/2018    Pre-op Diagnosis: right shoulder rotator cuff tear- small M75.101    Post-op Diagnosis: Rotator cuff syndrome of right shoulder [M75.101]    Procedure(s):  R16 ARTHROSCOPY, SHOULDER, SURGICAL; DECOMPRESS SUBACROMIAL SPACE W/PART ACROMIOPLASTY, W/CORACOACROMIAL RELEAS: 98119 (CPT??)  ARTHROSCOPY, SHOULDER, SURGICAL; WITH ROTATOR CUFF REPAIR: 14782 (CPT??)  Note: Revisions to procedures should be made in chart - see Procedures activity.    Performing Service: Orthopedics  Surgeon(s) and Role:     * Gonzella Lex, MD - Primary     * Mila Palmer, MD - Resident - Assisting    Assistant: None    Findings: Buford complex, supraspinatus cuff tear, subacromial impingement    Anesthesia: General    Estimated Blood Loss: 10 mL    Complications: None    Specimens: None collected    Implants:   Implant Name Type Inv. Item Serial No. Manufacturer Lot No. LRB No. Used Action   ANCHOR HEALIX 4.5MM W/ORTHOCORD - [id number] Screws Pins Plates ANCHOR HEALIX 4.5MM W/ORTHOCORD  [employer] [id number] Right 1 Implanted       Surgeon Notes: Dr. Ivin Poot was present and scrubbed for the entire procedure    Mila Palmer   Date: 10/02/2018  Time: 3:53 PM       I was present for the entirety of the procedure(s). Cruz Condon, MD

## 2018-10-03 NOTE — Unmapped (Signed)
Addendum  created 10/02/18 1753 by Rosalio Macadamia, MD    Order list changed

## 2018-10-03 NOTE — Unmapped (Signed)
Addendum  created 10/02/18 1756 by Rosalio Macadamia, MD    Clinical Note Signed

## 2018-10-06 NOTE — Unmapped (Signed)
Addendum  created 10/06/18 1637 by Clair Gulling, MD    Clinical Note Signed, Intraprocedure Event edited, Visit Navigator Flowsheet section accepted

## 2018-10-12 ENCOUNTER — Telehealth: Payer: Self-pay | Admitting: Anesthesiology

## 2018-10-12 MED ORDER — OXYCODONE 5 MG TABLET
ORAL_TABLET | Freq: Four times a day (QID) | ORAL | 0 refills | 3 days | Status: CP | PRN
Start: 2018-10-12 — End: 2018-10-16

## 2018-10-12 NOTE — Telephone Encounter (Signed)
Patient lvmail on Friday stating he needs appt. I called Monday to give him appt. 10-13-18 at 2pm, virtual visit. He states he is out of meds and has been since Sunday. I informed him he should call when he fills last script to set up next appt for meds refills. He wanted to know if Dr. Andree Elk could call in script today.

## 2018-10-12 NOTE — Telephone Encounter (Signed)
I have texted Dr. Andree Elk to see if he would e-scribe medication. Patient called and made aware and also informed Dr. Andree Elk not in the office today. I will contact patient if Dr. Andree Elk returns my text.

## 2018-10-13 ENCOUNTER — Encounter: Payer: Self-pay | Admitting: Anesthesiology

## 2018-10-13 ENCOUNTER — Other Ambulatory Visit: Payer: Self-pay

## 2018-10-13 ENCOUNTER — Ambulatory Visit: Payer: Medicare Other | Attending: Anesthesiology | Admitting: Anesthesiology

## 2018-10-13 DIAGNOSIS — R109 Unspecified abdominal pain: Secondary | ICD-10-CM

## 2018-10-13 DIAGNOSIS — G8929 Other chronic pain: Secondary | ICD-10-CM

## 2018-10-13 DIAGNOSIS — G894 Chronic pain syndrome: Secondary | ICD-10-CM | POA: Diagnosis not present

## 2018-10-13 DIAGNOSIS — F1190 Opioid use, unspecified, uncomplicated: Secondary | ICD-10-CM

## 2018-10-13 DIAGNOSIS — M542 Cervicalgia: Secondary | ICD-10-CM

## 2018-10-13 DIAGNOSIS — K5090 Crohn's disease, unspecified, without complications: Secondary | ICD-10-CM | POA: Diagnosis not present

## 2018-10-13 DIAGNOSIS — M47816 Spondylosis without myelopathy or radiculopathy, lumbar region: Secondary | ICD-10-CM

## 2018-10-13 MED ORDER — OXYCODONE-ACETAMINOPHEN 10-325 MG PO TABS: 1.0000 | ORAL_TABLET | Freq: Three times a day (TID) | ORAL | 0 refills | Status: AC | PRN

## 2018-10-13 MED ORDER — OXYCODONE-ACETAMINOPHEN 10-325 MG PO TABS: 1.0000 | ORAL_TABLET | Freq: Three times a day (TID) | ORAL | 0 refills | Status: DC | PRN

## 2018-10-13 NOTE — Progress Notes (Signed)
Virtual Visit via Telephone Note  I connected with Benjamin Murillo on 10/13/18 at  2:00 PM EDT by telephone and verified that I am speaking with the correct person using two identifiers.  Location: Patient: Home Provider: Pain control center   I discussed the limitations, risks, security and privacy concerns of performing an evaluation and management service by telephone and the availability of in person appointments. I also discussed with the patient that there may be a patient responsible charge related to this service. The patient expressed understanding and agreed to proceed.   History of Present Illness: I spoke with [name] via telephone for his medical conferencing today.  He has been doing well in regards to his pain management using his Percocet for management generally 2-3 times per day.  This regimen is worked well for him for some time now.  He did recently have a rotator cuff repair and was given some additional opioid for management of that.  He had no side effects from the medication and discover that additional pain.  Otherwise he is in his usual state of health denying any other changes in the quality characteristic or distribution of his neck shoulder low back or abdominal pain.  These have remained stable in nature and he continues to get good relief from his medications without side effect.  He states that he is continuing to derive good functional lifestyle improvements from the medications.    Observations/Objective:  Current Outpatient Medications:  .  amlodipine-atorvastatin (CADUET) 10-10 MG tablet, Take 1 tablet by mouth daily., Disp: , Rfl:  .  dronabinol (MARINOL) 10 MG capsule, Take 10 mg by mouth 2 (two) times daily before a meal., Disp: , Rfl:  .  famotidine (PEPCID) 20 MG tablet, Take 20 mg by mouth 2 (two) times daily., Disp: , Rfl:  .  gabapentin (NEURONTIN) 100 MG capsule, Take 1 capsule (100 mg total) by mouth at bedtime., Disp: 30 capsule, Rfl: 1 .   hyoscyamine (LEVSIN, ANASPAZ) 0.125 MG tablet, Take 0.125 mg by mouth as needed. , Disp: , Rfl:  .  naloxone (NARCAN) nasal spray 4 mg/0.1 mL, For respiratory depression from opioids, Disp: 1 kit, Rfl: 2 .  oxyCODONE-acetaminophen (PERCOCET) 10-325 MG tablet, Take 1 tablet by mouth every 8 (eight) hours as needed for pain., Disp: 75 tablet, Rfl: 0 .  [START ON 11/12/2018] oxyCODONE-acetaminophen (PERCOCET) 10-325 MG tablet, Take 1 tablet by mouth every 8 (eight) hours as needed for pain., Disp: 75 tablet, Rfl: 0 .  prochlorperazine (COMPAZINE) 5 MG tablet, Take 5 mg by mouth every 8 (eight) hours as needed., Disp: , Rfl:  .  STELARA 90 MG/ML SOSY injection, Inject 90 mg as directed., Disp: , Rfl:   Assessment and Plan: 1. Chronic abdominal pain   2. Chronic pain syndrome   3. Chronic, continuous use of opioids   4. Crohn's disease without complication, unspecified gastrointestinal tract location ([name])   5. Cervicalgia   6. Facet arthritis of lumbar region   Based on our discussion today I am going to refill his medications for September 15 and October 15 for 75 Percocet tablets of 10 mg strength with return to clinic in 2 months.  I have discussed issues regarding his narcotic contract and the request has been made for him to reach out to [place] to notify us when he receives any opioids from any other additional pain source.  I have reviewed the practitioner database information and it is otherwise appropriate.  I want  him to continue follow-up with his orthopedic physicians and GI physicians for his Crohn's disease.  He is instructed to contact us at the pain control center should he have any problems with pain management.   Follow Up Instructions:    I discussed the assessment and treatment plan with the patient. The patient was provided an opportunity to ask questions and all were answered. The patient agreed with the plan and demonstrated an understanding of the instructions.   The patient was  advised to call back or seek an in-person evaluation if the symptoms worsen or if the condition fails to improve as anticipated.  I provided 30 minutes of non-face-to-face time during this encounter.   [name], MD

## 2018-10-14 ENCOUNTER — Telehealth: Payer: Self-pay | Admitting: *Deleted

## 2018-10-15 ENCOUNTER — Ambulatory Visit: Admit: 2018-10-15 | Discharge: 2018-10-16 | Payer: MEDICARE

## 2018-10-15 DIAGNOSIS — S46011D Strain of muscle(s) and tendon(s) of the rotator cuff of right shoulder, subsequent encounter: Secondary | ICD-10-CM

## 2018-10-15 NOTE — Unmapped (Signed)
ORTHOPAEDIC POST-OPERATIVE CLINIC NOTE    DOS: 10/02/18  Procedure performed: R RCR (single row single anchor, supraspinatus tear), Buford complex (no biceps surgery)    ASSESSMENT:  Appropriate progress s/p above surgery    PLAN:  Will start PT at [facility]. Sling full time except showering with hands in front of his belly.    - Follow-up plan: Return in about 4 weeks (around 11/12/2018).  - X-rays at next visit: none    SUBJECTIVE:  HPI:  Adequate pain control, improving, has taken 5 narcotic tablets of his last refill. No numbness/tingling. No fevers/chills. No drainage/redness around incisions. No other concerns.    Past Medical History:  No date: Anxiety  No date: Crohn's disease (CMS-HCC)      Comment:  diagnosed in 1990  No date: GERD (gastroesophageal reflux disease)  10/08/2016: Hypertension    Past Surgical History:  No date: COLON SURGERY  11/10/2012: PR COLONOSCOPY FLX DX W/COLLJ SPEC WHEN PFRMD; Left      Comment:  Procedure: COLONOSCOPY, FLEXIBLE, PROXIMAL TO SPLENIC                FLEXURE; DIAGNOSTIC, W/WO COLLECTION SPECIMEN BY BRUSH OR               WASH;  Surgeon: Malcolm Metro, MD;  Location: GI                [facility];  Service: Gastroenterology  07/21/2013: PR COLONOSCOPY FLX DX W/COLLJ SPEC WHEN PFRMD; N/A      Comment:  Procedure: COLONOSCOPY, FLEXIBLE, PROXIMAL TO SPLENIC                FLEXURE; DIAGNOSTIC, W/WO COLLECTION SPECIMEN BY BRUSH OR               WASH;  Surgeon: Gwen Pounds, MD;  Location: GI                [facility];  Service: Gastroenterology  08/09/2016: PR COLONOSCOPY FLX DX W/COLLJ SPEC WHEN PFRMD; N/A      Comment:  Procedure: COLONOSCOPY, FLEXIBLE, PROXIMAL TO SPLENIC                FLEXURE; DIAGNOSTIC, W/WO COLLECTION SPECIMEN BY BRUSH OR               WASH;  Surgeon: Janyth Pupa, MD;  Location: GI                [facility]URES [facility];  Service: Gastroenterology  04/06/2018: PR COLONOSCOPY FLX DX W/COLLJ SPEC WHEN PFRMD; N/A      Comment: Procedure: COLONOSCOPY, FLEXIBLE, PROXIMAL TO SPLENIC                FLEXURE; DIAGNOSTIC, W/WO COLLECTION SPECIMEN BY BRUSH OR               WASH;  Surgeon: Zetta Bills, MD;  Location: GI                PROCEDURES [facility];  Service: Gastroenterology  07/23/2012: PR COLONOSCOPY W/BIOPSY SINGLE/MULTIPLE      Comment:  Procedure: COLONOSCOPY, FLEXIBLE, PROXIMAL TO SPLENIC                FLEXURE; WITH BIOPSY, SINGLE OR MULTIPLE;  Surgeon:                Vickii Chafe, MD;  Location: GI [facility]URES [facility];  Service: Gastroenterology  07/15/2014: PR COLONOSCOPY W/BIOPSY SINGLE/MULTIPLE; N/A      Comment:  Procedure: COLONOSCOPY, FLEXIBLE, PROXIMAL TO SPLENIC                FLEXURE; WITH BIOPSY, SINGLE OR MULTIPLE;  Surgeon:                Janyth Pupa, MD;  Location: GI PROCEDURES                [facility];  Service: Gastroenterology  03/21/2017: PR COLONOSCOPY W/BIOPSY SINGLE/MULTIPLE; N/A      Comment:  Procedure: COLONOSCOPY, FLEXIBLE, PROXIMAL TO SPLENIC                FLEXURE; WITH BIOPSY, SINGLE OR MULTIPLE;  Surgeon: Modena Nunnery, MD;  Location: GI PROCEDURES                [place] [facility];  Service: Gastroenterology  07/15/2014: PR COLSC FLEXIBLE W/TRANSENDOSCOPIC BALLOON DILAT; N/A      Comment:  Procedure: COLONOSCOPY, FLEXIBLE; WITH DILATION BY                BALLOON, 1 OR MORE STRICTURES;  Surgeon: Janyth Pupa, MD;  Location: GI [facility]URES [facility];                 Service: Gastroenterology  09/16/2016: PR REMVL COLON & TERM ILEUM W/ILEOCOLOSTOMY; N/A      Comment:  Procedure: COLECTOMY, PARTIAL, WITH REMOVAL OF TERMINAL                ILEUM WITH ILEOCOLOSTOMY;  Surgeon: Lady Gary,                MD;  Location: MAIN OR [place];  Service: Gastrointestinal  10/02/2018: PR SHLDR ARTHROSCOP,PART ACROMIOPLAS; Right      Comment:  Procedure: R16 ARTHROSCOPY, SHOULDER, SURGICAL;                DECOMPRESS SUBACROMIAL SPACE W/PART ACROMIOPLASTY,                Tamala Bari;  Surgeon: Gonzella Lex,                MD;  Location: ASC OR [facility];  Service: Orthopedics  10/02/2018: PR SHLDR ARTHROSCOP,SURG,W/ROTAT CUFF REPR; Right      Comment:  Procedure: ARTHROSCOPY, SHOULDER, SURGICAL; WITH ROTATOR               CUFF REPAIR;  Surgeon: Gonzella Lex, MD;                 Location: ASC OR [facility];  Service: Orthopedics  07/23/2012: PR UPPER GI ENDOSCOPY,BIOPSY; N/A      Comment:  Procedure: UGI ENDOSCOPY; WITH BIOPSY, SINGLE OR                MULTIPLE;  Surgeon: Vickii Chafe, MD;  Location: GI                [facility]URES [facility];  Service: Gastroenterology  11/10/2012: PR UPPER GI ENDOSCOPY,DIAGNOSIS; N/A      Comment:  Procedure: UGI ENDO, INCLUDE ESOPHAGUS, STOMACH, &                DUODENUM &/OR JEJUNUM; DX W/WO COLLECTION SPECIMN, BY  BRUSH OR WASH;  Surgeon: Malcolm Metro, MD;  Location:               GI PROCEDURES [facility];  Service: Gastroenterology  07/21/2013: PR UPPER GI ENDOSCOPY,DIAGNOSIS; N/A      Comment:  Procedure: UGI ENDO, INCLUDE ESOPHAGUS, STOMACH, &                DUODENUM &/OR JEJUNUM; DX W/WO COLLECTION SPECIMN, BY                BRUSH OR WASH;  Surgeon: Gwen Pounds, MD;  Location: GI               PROCEDURES [facility];  Service: Gastroenterology  07/15/2014: PR UPPER GI ENDOSCOPY,DIAGNOSIS; N/A      Comment:  Procedure: UGI ENDO, INCLUDE ESOPHAGUS, STOMACH, &                DUODENUM &/OR JEJUNUM; DX W/WO COLLECTION SPECIMN, BY                BRUSH OR WASH;  Surgeon: Janyth Pupa, MD;                 Location: GI PROCEDURES [facility];  Service:                Gastroenterology  No date: SPINE SURGERY    Current Outpatient Medications   Medication Instructions   ??? aspirin (ECOTRIN) 81 mg, Oral, Daily (standard)   ??? busPIRone (BUSPAR) 10 mg, Oral, 2 times a day (standard), Takes as needed   ??? DOK 100 mg, Oral, 2 times a day (standard)   ??? dronabinoL (MARINOL) 10 MG capsule TAKE 1 CAPSULE BY MOUTH TWICE DAILY BEFORE A MEAL   ??? gabapentin (NEURONTIN) 300 mg, Oral, 3 times a day (standard), Takes as needed   ??? gabapentin (NEURONTIN) 100 mg, Oral, 3 times a day (standard)   ??? hydrOXYzine (ATARAX) 25 mg, Oral, Every 6 hours PRN   ??? naloxone (NARCAN) 4 mg nasal spray For respiratory depression from opioids   ??? ondansetron (ZOFRAN ODT) 4 MG disintegrating tablet Dissolve 1 tablet (4 mg total) on the tongue every six (6) hours as needed for nausea for up to 10 doses.   ??? oxyCODONE (ROXICODONE) 5 MG immediate release tablet Take 1 to 2 tablets (5-10 mg total) by mouth every six (6) hours as needed for pain for up to 5 days.   ??? oxyCODONE-acetaminophen (PERCOCET) 10-325 mg per tablet 1 tablet, Oral, Every 8 hours PRN   ??? prochlorperazine (COMPAZINE) 5-10 mg, Oral, Every 8 hours PRN   ??? sildenafiL (VIAGRA) 50 mg, Oral, Daily PRN   ??? ustekinumab (STELARA) 90 mg/mL Syrg syringe Inject the contents of 1 syringe (90 mg total) under the skin every 8 weeks.       Allergies   Allergen Reactions   ??? Dilaudid [Hydromorphone] Anxiety   ??? Morphine Itching   ??? Opioids - Morphine Analogues Itching   ??? Tramadol Itching and Anxiety   ??? Vicodin [Hydrocodone-Acetaminophen] Anxiety       OBJECTIVE:    PE:  General Appearance ?? Well-nourished and no acute distress   Mood and Affect ?? Alert, cooperative and pleasant   Cardiovascular ?? Well-perfused distally   Sensation ?? Sensation intact to light touch distally   MUSCULOSKELETAL    Operative extremity ?? Wounds appear benign and healing appropriately.  ?? Tolerates gentle ROM     Imaging:  none

## 2018-10-16 MED ORDER — OXYCODONE 5 MG TABLET
ORAL_TABLET | Freq: Four times a day (QID) | ORAL | 0 refills | 7 days | Status: CP | PRN
Start: 2018-10-16 — End: 2018-10-26

## 2018-10-16 NOTE — Unmapped (Signed)
I saw and evaluated the patient, participating in the key portions of the service.  I reviewed the resident???s note.  I agree with the resident???s findings and plan. Cruz Condon, MD

## 2018-10-22 NOTE — Unmapped (Signed)
[facility] Refill Coordination Note    Specialty Medication(s) to be Shipped:   Inflammatory Disorders: Stelara    Other medication(s) to be shipped:      Rodney Bender, DOB: November 19, 1970  Phone: [phone] (home)       All above HIPAA information was verified with patient.     Completed refill call assessment today to schedule patient's medication shipment from the [facility] [phone]).       Specialty medication(s) and dose(s) confirmed: Regimen is correct and unchanged.   Changes to medications: Raider reports no changes at this time.  Changes to insurance: No  Questions for the pharmacist: No    Confirmed patient received Welcome Packet with first shipment. The patient will receive a drug information handout for each medication shipped and additional FDA Medication Guides as required.       DISEASE/MEDICATION-SPECIFIC INFORMATION        For patients on injectable medications: Patient currently has 0 doses left.  Next injection is scheduled for ABOUT 10/10 (NOT AT HOME) .    SPECIALTY MEDICATION ADHERENCE     Medication Adherence    Specialty Medication: STELARA   Patient is on additional specialty medications: No  Informant: patient  Confirmed plan for next specialty medication refill: delivery by pharmacy  Refills needed for supportive medications: not needed          Refill Coordination    Has the Patients' Contact Information Changed: No  Is the Shipping Address Different: No           STELARA 90 mg/ml: 14 days of medicine on hand          SHIPPING     Shipping address confirmed in [employer].     Delivery Scheduled: Yes, Expected medication delivery date: 10/2.     Medication will be delivered via Same Day Courier to the home address in [place].    Jolene Schimke   [facility] Specialty Technician

## 2018-10-23 ENCOUNTER — Ambulatory Visit
Admit: 2018-10-23 | Discharge: 2018-11-21 | Payer: MEDICARE | Attending: Rehabilitative and Restorative Service Providers" | Primary: Rehabilitative and Restorative Service Providers"

## 2018-10-23 DIAGNOSIS — G8929 Other chronic pain: Secondary | ICD-10-CM

## 2018-10-23 DIAGNOSIS — M25511 Pain in right shoulder: Principal | ICD-10-CM

## 2018-10-23 DIAGNOSIS — S46011D Strain of muscle(s) and tendon(s) of the rotator cuff of right shoulder, subsequent encounter: Secondary | ICD-10-CM

## 2018-10-23 NOTE — Unmapped (Addendum)
[facility] ALL[facility] PT [facility]  OUTPATIENT PHYSICAL THERAPY  10/23/2018          Patient Name: Rodney Bender [facility]  Date of Birth:September 04, 1970  Diagnosis:   Encounter Diagnoses   Name Primary?   ??? Chronic right shoulder pain Yes   ??? Traumatic tear of right rotator cuff, unspecified tear extent, subsequent encounter      Referring MD:  Gonzella Lex, MD     Plan of Care Effective Date:       Assessment & Plan     Assessment details: Pt presents with R shoulder pain, postop R RCR (10/02/2018). Pt reports intermittent R shld and R UE pain and occasional numbness in R hand. Pt tol midrange R elbow flex PROM and R wrist AROM and grip activities with no significant increase in pain. Pt will benefit from skilled PT services to address above impairments.       Impairments: decreased range of motion, decreased strength, pain and postural weakness                Clinical Presentation: stable  Clinical Decision Making: low    Prognosis: good      Negative Prognosis Rationale: Pain Status and chronicity of condition.        Therapy Goals  Goals: In 2 weeks, pt will be independent with home exercise program  In 6 weeks, pt will demonstrate 140 deg of R shld flex PROM and 80 deg of shld abd PROM  In 12 weeks, pt will demonstrate R shld AROM WFL without pain in order to return to previous functional and recreational activities      Plan  Therapy options: will be seen for skilled physical therapy services    Planned therapy interventions: manual therapy, neuromuscular re-education, postural training, body mechanics training, education - patient, therapeutic exercises and home exercise program        Frequency: 1-2x per week.    Duration in weeks: 12    Education provided to: patient.  Education provided: importance of Therapy and HEP  Education Results: verbalized good understanding.    Next visit plan: continue to progress PROM and therapeutic exercise per protocol  Total Session Time: 40  Treatment rendered today: Evaluation (20 min)    Therapeutic exercise (20 min)  R elbow flex PROM- reported increase in pain after ~70 deg; continued from 0-70 deg  R Wrist flex/ext AROM: 1x15-added to HEP  R Wrist radial/ulnar deviation: 1x15-added to HEP  R Wrist circumduction: 1x15-added to HEP  R grip with ball: 1x10-added to HEP  Plan details: Will progress per RCR protocol          Subjective     History of Present Illness  Date of Onset: 10/22/2013  Date of Surgery: 10/02/2018  Date of Evaluation: 10/23/2018  Surgical Procedure: RCR (R RCR)  Reason for Referral/Chief Complaint: R shoulder pain- postop R rotator cuff surgery      Subjective: Intermittent aching in R shoulder and R arm. Feels occasional numbness in R arm and hand. R shoulder feels a little better overall now since before surgery. Has had R shoulder pain intermittently for about 5 years. Does not recall a specific MOI.     Quality of life: good          Pain      Current pain rating: 7      At best pain rating: 3      At worst pain rating: 9  Location: R shoulder and R arm      Quality: aching, sharp and numbing (numbness in R hand)      Relieving factors: medications and ice      Exacerbated by: Pain during sleep in any position.      Pain Related Behaviors: none    Progression: improved (Does not have pressure sensation in posterior R shoulder as before surgery)              Prior Functional Status: Physical Limitation(s)-work and recreational activities    Current functional status: disturbed sleep, limited lifting and limited recreation      Precautions and Equipment  : postop protocol.  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    Postural Observations  Seated posture: fair        Palpation     Right   No palpable tenderness to the biceps and wrist extensors.   Hypertonic in the upper trapezius. Tenderness of the anterior deltoid, middle deltoid, posterior deltoid, triceps and upper trapezius.     Tenderness     Right Shoulder  Tenderness in the [place] joint.     Active Range of Motion   Left Shoulder   Flexion: WFL  Abduction: WFL    Left Elbow   Flexion: WFL  Extension: WFL    Left Wrist   Normal active range of motion    Right Wrist   Normal active range of motion    Passive Range of Motion     Right Elbow   Flexion: WFL and with pain  Extension: WFL    Right Wrist   Normal passive range of motion    Strength/Myotome Testing     Left Shoulder     Planes of Motion   Abduction: 5   External rotation at 0??: 5   Internal rotation at 0??: 5     Left Elbow   Flexion: 5  Extension: 5    Left Wrist/Hand   Wrist extension: 5  Wrist flexion: 5    Right Wrist/Hand   Wrist extension: 5  Wrist flexion: 5                            I attest that I have reviewed the above information.  Signed: Lanae Crumbly, PT  10/23/2018 12:21 PM

## 2018-10-26 MED ORDER — OXYCODONE 5 MG TABLET
ORAL_TABLET | Freq: Four times a day (QID) | ORAL | 0 refills | 5 days | Status: CP | PRN
Start: 2018-10-26 — End: ?

## 2018-10-30 DIAGNOSIS — M25511 Pain in right shoulder: Principal | ICD-10-CM

## 2018-10-30 DIAGNOSIS — S46011D Strain of muscle(s) and tendon(s) of the rotator cuff of right shoulder, subsequent encounter: Secondary | ICD-10-CM

## 2018-10-30 DIAGNOSIS — G8929 Other chronic pain: Secondary | ICD-10-CM

## 2018-10-30 MED FILL — STELARA 90 MG/ML SUBCUTANEOUS SYRINGE: 56 days supply | Qty: 1 | Fill #1 | Status: AC

## 2018-10-30 MED FILL — STELARA 90 MG/ML SUBCUTANEOUS SYRINGE: SUBCUTANEOUS | 56 days supply | Qty: 1 | Fill #1

## 2018-10-30 NOTE — Unmapped (Signed)
[facility] ALLIED HEALTH PT [facility]  OUTPATIENT PHYSICAL THERAPY  10/30/2018          Patient Name: Rodney Bender [facility]  Date of Birth:1970/04/01  Diagnosis:   Encounter Diagnoses   Name Primary?   ??? Chronic right shoulder pain Yes   ??? Traumatic tear of right rotator cuff, unspecified tear extent, subsequent encounter      Referring MD:  Gonzella Lex, MD     Plan of Care Effective Date:       Assessment & Plan     Assessment details: Pt reports similar severity of symptoms since previous session. Pt has been consistent with home exercise program. Pt tol elbow flex/ext PROM with no increase in pain, but reported pain after ~10 deg of shld flex PROM. Did not report increase in pain during therex. Will plan to continue passive and active stretching as per protocol.        Impairments: decreased range of motion, decreased strength, pain and postural weakness                Clinical Presentation: stable  Clinical Decision Making: low    Prognosis: good      Negative Prognosis Rationale: Pain Status and chronicity of condition.        Therapy Goals  Goals: In 2 weeks, pt will be independent with home exercise program  In 6 weeks, pt will demonstrate 140 deg of R shld flex PROM and 80 deg of shld abd PROM  In 12 weeks, pt will demonstrate R shld AROM WFL without pain in order to return to previous functional and recreational activities      Plan  Therapy options: will be seen for skilled physical therapy services    Planned therapy interventions: manual therapy, neuromuscular re-education, postural training, body mechanics training, education - patient, therapeutic exercises and home exercise program        Frequency: 1x week    Duration in weeks: 12    Education provided to: patient.  Education provided: importance of Therapy and HEP  Education Results: verbalized good understanding.    Next visit plan: continue to progress PROM and therapeutic exercise per protocol  Total Session Time: 33  Treatment rendered today: Manual therapy (18 min)  R elbow flex PROM- achieved full range with no increase in pain  R shld flex PROM 0-10 deg- reported increase in shld pain and mild burning sensation after ~10 deg.     Therapeutic exercise (15 min)  R wrist AROM in all planes 1x10  R shld pendulum standing: 4x30 sec    HEP:   R shld pendulum  R elbow flex/ext AROM   R Wrist flex/ext AROM  R Wrist radial/ulnar deviation  R Wrist circumduction  R grip with ball  Plan details: Will progress per RCR protocol          Subjective     History of Present Illness  Date of Onset: 10/22/2013  Date of Surgery: 10/02/2018  Date of Evaluation: 10/23/2018  Surgical Procedure: RCR (R RCR)  Reason for Referral/Chief Complaint: R shoulder pain- postop R rotator cuff surgery      Subjective: Shoulder feels about the same since last session. Most painful movements for R shoulder are taking the sling off before showering and donning/doffing shirt. Pain is sharp during movements but would relieve after a short moment. Still feels intermittent forearm pain and hand N/T, but does not believe that a specific movement or position triggers the symptoms. Has consistently  performed home exercises with no increase in pain.      Quality of life: good          Pain      Current pain rating: 6      At best pain rating: 0      At worst pain rating: 9      Location: R shoulder and R arm      Quality: aching, sharp, numbing and tingling (numbness in R hand)      Relieving factors: medications and ice      Exacerbated by: Pain during sleep in any position.      Pain Related Behaviors: none    Progression: improved (Does not have pressure sensation in posterior R shoulder as before surgery)              Prior Functional Status: Physical Limitation(s)-work and recreational activities    Current functional status: disturbed sleep, limited lifting and limited recreation      Precautions and Equipment  : postop protocol.  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    Postural Observations  Seated posture: fair        Palpation     Right   No palpable tenderness to the biceps and wrist extensors.   Hypertonic in the upper trapezius. Tenderness of the anterior deltoid, middle deltoid, posterior deltoid, triceps and upper trapezius.     Tenderness     Right Shoulder  Tenderness in the [facility] joint.     Active Range of Motion   Left Shoulder   Flexion: WFL  Abduction: WFL    Left Elbow   Flexion: WFL  Extension: WFL    Left Wrist   Normal active range of motion    Right Wrist   Normal active range of motion    Passive Range of Motion     Right Elbow   Flexion: WFL and with pain  Extension: WFL    Right Wrist   Normal passive range of motion    Strength/Myotome Testing     Left Shoulder     Planes of Motion   Abduction: 5   External rotation at 0??: 5   Internal rotation at 0??: 5     Left Elbow   Flexion: 5  Extension: 5    Left Wrist/Hand   Wrist extension: 5  Wrist flexion: 5    Right Wrist/Hand   Wrist extension: 5  Wrist flexion: 5                            I attest that I have reviewed the above information.  Signed:  Lanae Crumbly, PT  10/30/2018 2:29 PM

## 2018-11-06 ENCOUNTER — Ambulatory Visit
Admit: 2018-11-06 | Discharge: 2018-12-05 | Payer: MEDICARE | Attending: Rehabilitative and Restorative Service Providers" | Primary: Rehabilitative and Restorative Service Providers"

## 2018-11-06 NOTE — Unmapped (Signed)
[facility] ALLIED HEALTH PT [facility]  OUTPATIENT PHYSICAL THERAPY  11/06/2018          Patient Name: Rodney Bender  Date of Birth:Mar 26, 1970  Diagnosis:   Encounter Diagnoses   Name Primary?   ??? Chronic right shoulder pain Yes   ??? Traumatic tear of right rotator cuff, unspecified tear extent, subsequent encounter      Referring MD:  Gonzella Lex, MD     Plan of Care Effective Date:       Assessment & Plan     Assessment details: Pt continues to be consistent with performing home exercise program, but requires mod verbal cueing to perform pendulums with appropriate mechanics. Pt tol short range PROM in shld flex and ext (towards functional IR). Pt reported 5-6/10 shld pain and intermittent mild hand tingling before and during today's session. Will continue to gradually progress PROM during treatment as per protocol.       Impairments: decreased range of motion, decreased strength, pain and postural weakness                Clinical Presentation: stable  Clinical Decision Making: low    Prognosis: good      Negative Prognosis Rationale: Pain Status and chronicity of condition.        Therapy Goals  Goals: In 2 weeks, pt will be independent with home exercise program  In 6 weeks, pt will demonstrate 140 deg of R shld flex PROM and 80 deg of shld abd PROM  In 12 weeks, pt will demonstrate R shld AROM WFL without pain in order to return to previous functional and recreational activities      Plan  Therapy options: will be seen for skilled physical therapy services    Planned therapy interventions: manual therapy, neuromuscular re-education, postural training, body mechanics training, education - patient, therapeutic exercises and home exercise program        Frequency: 1x week    Duration in weeks: 12    Education provided to: patient.  Education provided: importance of Therapy and HEP  Education Results: verbalized good understanding.    Next visit plan: continue to progress PROM and therapeutic exercise per protocol  Total Session Time: 40  Treatment rendered today: Manual therapy (25 min)  Supine R elbow flex PROM- achieved full range with no increase in pain  Supine R shld flex PROM 0-10 deg- reported increase in shld pain after ~10 deg.  Seated functional IR PROM (began shld ext phase 0-10 deg)- reported increase in shld pain after 10 deg of shld ext     Therapeutic exercise (15 min)  R wrist AROM in all planes 1x10  R elbow flex AROM 1x15  R shld pendulum standing: 4x30 sec- provided mod verbal cueing to relax shoulder and move only body    HEP:   R shld pendulum  R elbow flex/ext AROM   R Wrist flex/ext AROM  R Wrist radial/ulnar deviation AROM  R Wrist circumduction AROM  R grip with ball  Plan details: Will progress per RCR protocol          Subjective     History of Present Illness  Date of Onset: 10/22/2013  Date of Surgery: 10/02/2018  Date of Evaluation: 10/23/2018  Surgical Procedure: RCR (R RCR)  Reason for Referral/Chief Complaint: R shoulder pain- postop R rotator cuff surgery      Subjective: Similar shld pain since previous session. Still has worst shld pain when donning/doffing shirt and doffing sling  before showering. Has performed home exercises consistently without significant increase in pain.    Quality of life: good          Pain      Current pain rating: 5      At best pain rating: 0      At worst pain rating: 9      Location: R shoulder and R arm      Quality: aching, sharp, numbing and tingling (numbness in R hand)      Relieving factors: medications and ice      Exacerbated by: Pain during sleep in any position.      Pain Related Behaviors: none    Progression: improved (Does not have pressure sensation in posterior R shoulder as before surgery)              Prior Functional Status: Physical Limitation(s)-work and recreational activities    Current functional status: disturbed sleep, limited lifting and limited recreation      Precautions and Equipment  : postop protocol.  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    Postural Observations  Seated posture: fair  Standing posture: fair        Palpation     Right   No palpable tenderness to the biceps and wrist extensors.   Hypertonic in the upper trapezius. Tenderness of the anterior deltoid, middle deltoid, posterior deltoid, triceps and upper trapezius.     Tenderness     Right Shoulder  Tenderness in the [facility] joint.     Active Range of Motion   Left Shoulder   Flexion: WFL  Abduction: WFL    Left Elbow   Flexion: WFL  Extension: WFL    Left Wrist   Normal active range of motion    Right Wrist   Normal active range of motion    Passive Range of Motion     Right Elbow   Flexion: WFL and with pain  Extension: WFL    Right Wrist   Normal passive range of motion    Strength/Myotome Testing     Left Shoulder     Planes of Motion   Abduction: 5   External rotation at 0??: 5   Internal rotation at 0??: 5     Left Elbow   Flexion: 5  Extension: 5    Left Wrist/Hand   Wrist extension: 5  Wrist flexion: 5    Right Wrist/Hand   Wrist extension: 5  Wrist flexion: 5                            I attest that I  have reviewed the above information.  Signed: Lanae Crumbly, PT  11/06/2018 12:11 PM

## 2018-11-12 ENCOUNTER — Ambulatory Visit: Admit: 2018-11-12 | Discharge: 2018-11-13 | Payer: MEDICARE

## 2018-11-12 NOTE — Unmapped (Signed)
ORTHOPAEDIC POST-OPERATIVE CLINIC NOTE    DOS: 10/02/18  Procedure performed: R RCR (single row single anchor, supraspinatus tear), Buford complex (no biceps surgery)    ASSESSMENT:  Appropriate progress s/p above surgery    PLAN:  Rodney Bender can discontinue use of the sling at this point. He will continue to work with PT - his ROM today seems very appropriate. We discussed avoiding reaching out or overhead, but to start using the arm for ADLs. Will see him again in 6 weeks.    - Follow-up plan: 6 weeks  - X-rays at next visit: none    SUBJECTIVE:  HPI:  Rodney Bender is doing very well overall. He has minimal pain and is going to PT. He is still using the brace. He was very happy with his post op block and denies any RUE paresthesias. No fevers or chills.    Past Medical History:  No date: Anxiety  No date: Crohn's disease (CMS-HCC)      Comment:  diagnosed in 1990  No date: GERD (gastroesophageal reflux disease)  10/08/2016: Hypertension    Past Surgical History:  No date: COLON SURGERY  11/10/2012: PR COLONOSCOPY FLX DX W/COLLJ SPEC WHEN PFRMD; Left      Comment:  Procedure: COLONOSCOPY, FLEXIBLE, PROXIMAL TO SPLENIC                FLEXURE; DIAGNOSTIC, W/WO COLLECTION SPECIMEN BY BRUSH OR               WASH;  Surgeon: Malcolm Metro, MD;  Location: GI                [facility];  Service: Gastroenterology  07/21/2013: PR COLONOSCOPY FLX DX W/COLLJ SPEC WHEN PFRMD; N/A      Comment:  Procedure: COLONOSCOPY, FLEXIBLE, PROXIMAL TO SPLENIC                FLEXURE; DIAGNOSTIC, W/WO COLLECTION SPECIMEN BY BRUSH OR               WASH;  Surgeon: Gwen Pounds, MD;  Location: GI                PROC[facility]URES [facility];  Service: Gastroenterology  08/09/2016: PR COLONOSCOPY FLX DX W/COLLJ SPEC WHEN PFRMD; N/A      Comment:  Procedure: COLONOSCOPY, FLEXIBLE, PROXIMAL TO SPLENIC                FLEXURE; DIAGNOSTIC, W/WO COLLECTION SPECIMEN BY BRUSH OR               WASH;  Surgeon: Janyth Pupa, MD;  Location: GI [facility]URES [facility];  Service: Gastroenterology  04/06/2018: PR COLONOSCOPY FLX DX W/COLLJ SPEC WHEN PFRMD; N/A      Comment:  Procedure: COLONOSCOPY, FLEXIBLE, PROXIMAL TO SPLENIC                FLEXURE; DIAGNOSTIC, W/WO COLLECTION SPECIMEN BY BRUSH OR               WASH;  Surgeon: Zetta Bills, MD;  Location: GI                PROCEDURES [place] [facility];  Service: Gastroenterology  07/23/2012: PR COLONOSCOPY W/BIOPSY SINGLE/MULTIPLE      Comment:  Procedure: COLONOSCOPY, FLEXIBLE, PROXIMAL TO SPLENIC                FLEXURE; WITH BIOPSY, SINGLE OR MULTIPLE;  Surgeon:  Vickii Chafe, MD;  Location: GI [facility]URES [facility]                [facility];  Service: Gastroenterology  07/15/2014: PR COLONOSCOPY W/BIOPSY SINGLE/MULTIPLE; N/A      Comment:  Procedure: COLONOSCOPY, FLEXIBLE, PROXIMAL TO SPLENIC                FLEXURE; WITH BIOPSY, SINGLE OR MULTIPLE;  Surgeon:                Janyth Pupa, MD;  Location: GI PROCEDURES                [facility];  Service: Gastroenterology  03/21/2017: PR COLONOSCOPY W/BIOPSY SINGLE/MULTIPLE; N/A      Comment:  Procedure: COLONOSCOPY, FLEXIBLE, PROXIMAL TO SPLENIC                FLEXURE; WITH BIOPSY, SINGLE OR MULTIPLE;  Surgeon: Modena Nunnery, MD;  Location: GI PROCEDURES                [place] [facility];  Service: Gastroenterology  07/15/2014: PR COLSC FLEXIBLE W/TRANSENDOSCOPIC BALLOON DILAT; N/A      Comment:  Procedure: COLONOSCOPY, FLEXIBLE; WITH DILATION BY                BALLOON, 1 OR MORE STRICTURES;  Surgeon: Janyth Pupa, MD;  Location: GI [facility]URES [facility];                 Service: Gastroenterology  09/16/2016: PR REMVL COLON & TERM ILEUM W/ILEOCOLOSTOMY; N/A      Comment:  Procedure: COLECTOMY, PARTIAL, WITH REMOVAL OF TERMINAL                ILEUM WITH ILEOCOLOSTOMY;  Surgeon: Lady Gary,                MD;  Location: MAIN OR ;  Service: Gastrointestinal 10/02/2018: PR SHLDR ARTHROSCOP,PART ACROMIOPLAS; Right      Comment:  Procedure: R16 ARTHROSCOPY, SHOULDER, SURGICAL;                DECOMPRESS SUBACROMIAL SPACE W/PART ACROMIOPLASTY,                Tamala Bari;  Surgeon: Gonzella Lex,                MD;  Location: ASC OR [facility];  Service: Orthopedics  10/02/2018: PR SHLDR ARTHROSCOP,SURG,W/ROTAT CUFF REPR; Right      Comment:  Procedure: ARTHROSCOPY, SHOULDER, SURGICAL; WITH ROTATOR               CUFF REPAIR;  Surgeon: Gonzella Lex, MD;                 Location: ASC OR [facility];  Service: Orthopedics  07/23/2012: PR UPPER GI ENDOSCOPY,BIOPSY; N/A      Comment:  Procedure: UGI ENDOSCOPY; WITH BIOPSY, SINGLE OR                MULTIPLE;  Surgeon: Vickii Chafe, MD;  Location: GI                [facility]URES [facility];  Service: Gastroenterology  11/10/2012: PR UPPER GI ENDOSCOPY,DIAGNOSIS; N/A      Comment:  Procedure: UGI ENDO, INCLUDE ESOPHAGUS, STOMACH, &  DUODENUM &/OR JEJUNUM; DX W/WO COLLECTION SPECIMN, BY                BRUSH OR WASH;  Surgeon: Malcolm Metro, MD;  Location:               GI PROCEDURES [facility];  Service: Gastroenterology  07/21/2013: PR UPPER GI ENDOSCOPY,DIAGNOSIS; N/A      Comment:  Procedure: UGI ENDO, INCLUDE ESOPHAGUS, STOMACH, &                DUODENUM &/OR JEJUNUM; DX W/WO COLLECTION SPECIMN, BY                BRUSH OR WASH;  Surgeon: Gwen Pounds, MD;  Location: GI               PROC[facility]URES [facility];  Service: Gastroenterology  07/15/2014: PR UPPER GI ENDOSCOPY,DIAGNOSIS; N/A      Comment:  Procedure: UGI ENDO, INCLUDE ESOPHAGUS, STOMACH, &                DUODENUM &/OR JEJUNUM; DX W/WO COLLECTION SPECIMN, BY                BRUSH OR WASH;  Surgeon: Janyth Pupa, MD;                 Location: GI PROCEDURES [facility];  Service:                Gastroenterology  No date: SPINE SURGERY    Current Outpatient Medications   Medication Instructions ??? aspirin (ECOTRIN) 81 mg, Oral, Daily (standard)   ??? busPIRone (BUSPAR) 10 mg, Oral, 2 times a day (standard), Takes as needed   ??? dronabinoL (MARINOL) 10 MG capsule TAKE 1 CAPSULE BY MOUTH TWICE DAILY BEFORE A MEAL   ??? gabapentin (NEURONTIN) 300 mg, Oral, 3 times a day (standard), Takes as needed   ??? gabapentin (NEURONTIN) 100 mg, Oral, 3 times a day (standard)   ??? hydrOXYzine (ATARAX) 25 mg, Oral, Every 6 hours PRN   ??? naloxone (NARCAN) 4 mg nasal spray For respiratory depression from opioids   ??? ondansetron (ZOFRAN ODT) 4 MG disintegrating tablet Dissolve 1 tablet (4 mg total) on the tongue every six (6) hours as needed for nausea for up to 10 doses.   ??? oxyCODONE (ROXICODONE) 5 mg, Oral, Every 6 hours PRN   ??? oxyCODONE-acetaminophen (PERCOCET) 10-325 mg per tablet 1 tablet, Oral, Every 8 hours PRN   ??? prochlorperazine (COMPAZINE) 5-10 mg, Oral, Every 8 hours PRN   ??? sildenafiL (VIAGRA) 50 mg, Oral, Daily PRN   ??? ustekinumab (STELARA) 90 mg/mL Syrg syringe Inject the contents of 1 syringe (90 mg total) under the skin every 8 weeks.       Allergies   Allergen Reactions   ??? Dilaudid [Hydromorphone] Anxiety   ??? Morphine Itching   ??? Opioids - Morphine Analogues Itching   ??? Tramadol Itching and Anxiety   ??? Vicodin [Hydrocodone-Acetaminophen] Anxiety       OBJECTIVE:    PE:  General Appearance ?? Well-nourished and no acute distress   Mood and Affect ?? Alert, cooperative and pleasant   Cardiovascular ?? Well-perfused distally   Sensation ?? Sensation intact to light touch distally   MUSCULOSKELETAL    Operative extremity ?? Wounds appear benign and healing appropriately.  ?? Passive FF to 80, abd to 60, ER 10     Imaging:  none

## 2018-11-13 MED ORDER — DRONABINOL 10 MG CAPSULE: capsule | 0 refills | 0 days | Status: AC

## 2018-11-13 NOTE — Unmapped (Signed)
[facility] ALLIED HEALTH PT [facility]  OUTPATIENT PHYSICAL THERAPY  11/13/2018          Patient Name: Rodney Bender [facility]  Date of Birth:22-Sep-1970  Diagnosis:   Encounter Diagnoses   Name Primary?   ??? Chronic right shoulder pain Yes   ??? Traumatic tear of right rotator cuff, unspecified tear extent, subsequent encounter      Referring MD:  Gonzella Lex, MD     Plan of Care Effective Date:       Assessment & Plan     Assessment details: Pt tol significant increase in PROM and progression of therex today. Will continue to focus on improving ROM through PROM and AAROM strategies. Reinforced importance of improving ROM during postop rehab. Pt has discontinued sling per ortho's recommendations. While pt is improving ROM, recommended that pt attends two PT sessions per week for the next few weeks to better facilitate progress. Pt verbalized understanding.         Impairments: decreased range of motion, decreased strength, pain and postural weakness                Clinical Presentation: stable  Clinical Decision Making: low    Prognosis: good      Negative Prognosis Rationale: Pain Status and chronicity of condition.        Therapy Goals  Goals: In 2 weeks, pt will be independent with home exercise program-MET  In 6 weeks, pt will demonstrate 140 deg of R shld flex PROM and 80 deg of shld abd PROM  In 12 weeks, pt will demonstrate R shld AROM WFL without pain in order to return to previous functional and recreational activities      Plan  Therapy options: will be seen for skilled physical therapy services    Planned therapy interventions: manual therapy, neuromuscular re-education, postural training, body mechanics training, education - patient, therapeutic exercises and home exercise program        Frequency: 1x week (1-2x week)    Duration in weeks: 12    Education provided to: patient.  Education provided: importance of Therapy and HEP  Education Results: verbalized good understanding. Next visit plan: continue to progress PROM and therapeutic exercise per protocol  Total Session Time: 45  Treatment rendered today: Manual therapy (30 min)  Supine R elbow flex full PROM  Supine R shld ER PROM 0-30 deg  Supine R shld flex PROM 0-90 deg  Supine R shld abd PROM 0-80 deg  Seated functional IR PROM to T12    Therapeutic exercise (15 min)  Chest press AAROM with rod: 1x15  Shld flex AAROM with rod to 90 deg: 1x15  Shld ER AAROM to 30 deg: 1x15  R shld pendulum standing: 1x30 sec    HEP:   AAROM chest press, shld flex, ER with rod  R shld pendulum  R elbow flex/ext AROM   R Wrist flex/ext AROM  R Wrist radial/ulnar deviation AROM  R Wrist circumduction AROM  Plan details: Will progress per RCR protocol          Subjective     History of Present Illness  Date of Onset: 10/22/2013  Date of Surgery: 10/02/2018  Date of Evaluation: 10/23/2018  Surgical Procedure: RCR (R RCR)  Reason for Referral/Chief Complaint: R shoulder pain- postop R rotator cuff surgery      Subjective: R shoulder pain is slightly better since last session. Has consistently performed home exercises. Had follow-up visit with ortho yesterday: pt has discontinued  sling and is encouraged to use R UE for ADLs.    Quality of life: good          Pain      Current pain rating: 4      At best pain rating: 0      At worst pain rating: 6      Location: R shoulder and R arm      Quality: aching, sharp, numbing and tingling (numbness in R hand)      Relieving factors: medications and ice      Exacerbated by: Pain during sleep in any position.      Pain Related Behaviors: none    Progression: improved (Does not have pressure sensation in posterior R shoulder as before surgery)              Prior Functional Status: Physical Limitation(s)-work and recreational activities    Current functional status: disturbed sleep, limited lifting and limited recreation      Precautions and Equipment  : postop protocol. 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    Postural Observations  Seated posture: fair  Standing posture: fair        Palpation     Right   No palpable tenderness to the biceps and wrist extensors.   Hypertonic in the upper trapezius. Tenderness of the anterior deltoid, middle deltoid, posterior deltoid, triceps and upper trapezius.     Tenderness     Right Shoulder  Tenderness in the [facility] joint.     Active Range of Motion   Left Shoulder   Flexion: WFL  Abduction: WFL    Left Elbow   Flexion: WFL  Extension: WFL    Left Wrist   Normal active range of motion    Right Wrist   Normal active range of motion    Passive Range of Motion     Right Shoulder   Flexion: 90 degrees with pain  Abduction: 80 degrees with pain  External rotation 0??: 30 degrees with pain    Right Elbow   Flexion: WFL and with pain  Extension: WFL    Right Wrist   Normal passive range of motion    Strength/Myotome Testing     Left Shoulder     Planes of Motion   Abduction: 5   External rotation at 0??: 5   Internal rotation at 0??: 5     Left Elbow   Flexion: 5  Extension: 5    Left Wrist/Hand   Wrist extension: 5  Wrist flexion: 5    Right Wrist/Hand   Wrist extension: 5  Wrist flexion: 5  I attest that I have reviewed the above information.  Signed: Lanae Crumbly, PT  11/13/2018 12:36 PM

## 2018-11-13 NOTE — Unmapped (Signed)
I saw and evaluated the patient, participating in the key portions of the service.  I reviewed the resident???s note.  I agree with the resident???s findings and plan. Cruz Condon, MD

## 2018-11-20 NOTE — Unmapped (Signed)
[facility] ALLIED HEALTH PT [facility]  OUTPATIENT PHYSICAL THERAPY  11/20/2018          Patient Name: Rodney Bender [facility]  Date of Birth:January 12, 1971  Diagnosis:   Encounter Diagnoses   Name Primary?   ??? Chronic right shoulder pain Yes   ??? Traumatic tear of right rotator cuff, unspecified tear extent, subsequent encounter      Referring MD:  Gonzella Lex, MD     Plan of Care Effective Date:       Assessment & Plan     Assessment details: Pt tol increase in shld PROM and AAROM exercises today. Additional AAROM exercises were included in HEP. Will continue to emphasize improvement of ROM to tolerance as per protocol and will plan to add R UE strengthening exercises in an upcoming session.       Impairments: decreased range of motion, decreased strength, pain and postural weakness                Clinical Presentation: stable  Clinical Decision Making: low    Prognosis: good      Negative Prognosis Rationale: Pain Status and chronicity of condition.        Therapy Goals  Goals: In 2 weeks, pt will be independent with home exercise program-MET  In 6 weeks, pt will demonstrate 140 deg of R shld flex PROM and 80 deg of shld abd PROM  In 12 weeks, pt will demonstrate R shld AROM WFL without pain in order to return to previous functional and recreational activities      Plan  Therapy options: will be seen for skilled physical therapy services    Planned therapy interventions: manual therapy, neuromuscular re-education, postural training, body mechanics training, education - patient, therapeutic exercises and home exercise program        Frequency: 1x week (1-2x week)    Duration in weeks: 12    Education provided to: patient.  Education provided: importance of Therapy and HEP  Education Results: verbalized good understanding.    Next visit plan: continue to progress PROM and therapeutic exercise per protocol  Total Session Time: 45  Treatment rendered today: Manual therapy (30 min)  Supine R elbow flex full PROM Supine R shld ER PROM 0-35 deg  Supine R shld flex PROM 0-115 deg  Supine R shld abd PROM 0-90 deg  Supine R shld IR PROM 0-45 deg with shld abd at 45  Seated functional IR PROM to T12  Seated functional ER PROM to C7    Therapeutic exercise (15 min)  Chest press AAROM with rod: 1x5  Shld flex AAROM with rod: 1x5  Shld ER with rod AAROM: 1x5  Shld abd AAROM with rod: 1x10  Seated shld flex AAROM with towel     HEP:   Seated shld flex AAROM with towel  AAROM chest press, shld flex, ER, abd with rod  R shld pendulum  R elbow flex/ext AROM   R Wrist flex/ext AROM  R Wrist radial/ulnar deviation AROM  R Wrist circumduction AROM  Plan details: Will progress per RCR protocol          Subjective     History of Present Illness  Date of Onset: 10/22/2013  Date of Surgery: 10/02/2018  Date of Evaluation: 10/23/2018  Surgical Procedure: RCR (R RCR)  Reason for Referral/Chief Complaint: R shoulder pain- postop R rotator cuff surgery      Subjective: R shoulder pain continues to slightly improve. Has continued to use R UE  for ADLs. Has performed home exercises consistently.     Quality of life: good          Pain      Current pain rating: 3      At best pain rating: 0      At worst pain rating: 6      Location: R shoulder and R arm      Quality: aching, sharp, numbing and tingling (numbness in R hand)      Relieving factors: medications and ice      Exacerbated by: Pain during sleep in any position.      Pain Related Behaviors: none    Progression: improved (Does not have pressure sensation in posterior R shoulder as before surgery)              Prior Functional Status: Physical Limitation(s)-work and recreational activities    Current functional status: disturbed sleep, limited lifting and limited recreation      Precautions and Equipment  : postop protocol.  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    Postural Observations  Seated posture: fair  Standing posture: fair        Palpation     Right   No palpable tenderness to the biceps and wrist extensors.   Hypertonic in the upper trapezius. Tenderness of the anterior deltoid, middle deltoid, posterior deltoid, triceps and upper trapezius.     Tenderness     Right Shoulder  Tenderness in the [facility] joint.     Active Range of Motion   Left Shoulder   Flexion: WFL  Abduction: WFL    Left Elbow   Flexion: WFL  Extension: WFL    Left Wrist   Normal active range of motion    Right Wrist   Normal active range of motion    Additional Active Range of Motion Details  L functional ER to T2 and functional IR to T11    Passive Range of Motion     Right Shoulder   Flexion: 115 degrees with pain  Abduction: 90 degrees with pain  External rotation 0??: 35 degrees with pain  Internal rotation 45??: 45 degrees     Right Elbow   Flexion: WFL and with pain  Extension: WFL    Right Wrist   Normal passive range of motion    Strength/Myotome Testing     Left Shoulder     Planes of Motion   Abduction: 5   External rotation at 0??: 5   Internal rotation at 0??: 5     Left Elbow   Flexion: 5  Extension: 5    Left Wrist/Hand   Wrist extension: 5  Wrist flexion: 5    Right Wrist/Hand   Wrist extension: 5  Wrist flexion: 5                            I attest that I have reviewed the above information.  Signed: Lanae Crumbly, PT  11/20/2018 8:26 PM

## 2018-11-23 NOTE — Unmapped (Signed)
Refill not appropriate at this time- needs GI appointment

## 2018-11-25 ENCOUNTER — Emergency Department: Payer: No Typology Code available for payment source

## 2018-11-25 ENCOUNTER — Other Ambulatory Visit: Payer: Self-pay

## 2018-11-25 ENCOUNTER — Emergency Department
Admission: EM | Admit: 2018-11-25 | Discharge: 2018-11-25 | Disposition: A | Discharge status: Home / Self Care | Payer: No Typology Code available for payment source | Attending: Emergency Medicine | Admitting: Emergency Medicine

## 2018-11-25 DIAGNOSIS — Z87891 Personal history of nicotine dependence: Secondary | ICD-10-CM | POA: Diagnosis not present

## 2018-11-25 DIAGNOSIS — M542 Cervicalgia: Secondary | ICD-10-CM | POA: Diagnosis not present

## 2018-11-25 DIAGNOSIS — M25511 Pain in right shoulder: Secondary | ICD-10-CM | POA: Insufficient documentation

## 2018-11-25 DIAGNOSIS — Y999 Unspecified external cause status: Secondary | ICD-10-CM | POA: Diagnosis not present

## 2018-11-25 DIAGNOSIS — R001 Bradycardia, unspecified: Secondary | ICD-10-CM | POA: Insufficient documentation

## 2018-11-25 DIAGNOSIS — Y9389 Activity, other specified: Secondary | ICD-10-CM | POA: Insufficient documentation

## 2018-11-25 DIAGNOSIS — Z79899 Other long term (current) drug therapy: Secondary | ICD-10-CM | POA: Diagnosis not present

## 2018-11-25 DIAGNOSIS — I10 Essential (primary) hypertension: Secondary | ICD-10-CM | POA: Diagnosis not present

## 2018-11-25 DIAGNOSIS — Y92410 Unspecified street and highway as the place of occurrence of the external cause: Secondary | ICD-10-CM | POA: Insufficient documentation

## 2018-11-25 MED ORDER — KETOROLAC TROMETHAMINE 30 MG/ML IJ SOLN
30.0000 mg | Freq: Once | INTRAMUSCULAR | Status: AC
  Administered 2018-11-25: 30 mg via INTRAMUSCULAR
  Filled 2018-11-25: qty 1

## 2018-11-25 MED ORDER — METHOCARBAMOL 500 MG PO TABS: 500.0000 mg | ORAL_TABLET | Freq: Three times a day (TID) | ORAL | 0 refills | Status: AC | PRN

## 2018-11-25 MED ORDER — MELOXICAM 15 MG PO TABS: 15.0000 mg | ORAL_TABLET | Freq: Every day | ORAL | 1 refills | Status: AC
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
## 2018-11-26 ENCOUNTER — Ambulatory Visit: Admit: 2018-11-26 | Discharge: 2018-11-27 | Payer: MEDICARE

## 2018-11-26 DIAGNOSIS — G8929 Other chronic pain: Principal | ICD-10-CM

## 2018-11-26 DIAGNOSIS — M25519 Pain in unspecified shoulder: Principal | ICD-10-CM

## 2018-11-26 MED ORDER — OXYCODONE-ACETAMINOPHEN 5 MG-325 MG TABLET
ORAL_TABLET | ORAL | 0 refills | 3 days | Status: CP | PRN
Start: 2018-11-26 — End: ?

## 2018-11-26 MED ORDER — METHYLPREDNISOLONE 4 MG TABLETS IN A DOSE PACK
ORAL_TABLET | 1 refills | 0 days | Status: CP
Start: 2018-11-26 — End: ?

## 2018-11-26 NOTE — Unmapped (Signed)
Thank you for coming to [facility] and our clinic today!   We aim to provide you with the highest quality, individualized care.  If you have any unanswered questions   after the visit, please do not hesitate to reach out to [place] on MyChart or leave a message on our nurse   line at [phone].    If you need to schedule future appointments, please call [phone].     We look forward to seeing you again in the future and appreciate you choosing [facility] for your care!    Thank you,    Dr. Tinnie Gens T. Ivin Poot, MD, CAQSM            We provide innovative and comprehensive patient centered care that is supported by evidence-based research                                                                                                    COMING SOON!    Please check out our current research studies to see if you or someone you know may qualify at:    [email or web address]
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
## 2018-12-04 MED ORDER — DICLOFENAC SODIUM 75 MG TABLET,DELAYED RELEASE
ORAL_TABLET | Freq: Two times a day (BID) | ORAL | 1 refills | 20 days | Status: CP
Start: 2018-12-04 — End: 2018-12-18
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
## 2018-12-09 ENCOUNTER — Telehealth: Payer: Self-pay | Admitting: *Deleted

## 2018-12-09 ENCOUNTER — Other Ambulatory Visit: Payer: Self-pay

## 2018-12-09 ENCOUNTER — Ambulatory Visit: Payer: Medicare Other | Attending: Anesthesiology | Admitting: Anesthesiology

## 2018-12-09 ENCOUNTER — Encounter: Payer: Self-pay | Admitting: Anesthesiology

## 2018-12-09 DIAGNOSIS — G894 Chronic pain syndrome: Secondary | ICD-10-CM

## 2018-12-09 DIAGNOSIS — M542 Cervicalgia: Secondary | ICD-10-CM

## 2018-12-09 DIAGNOSIS — M47816 Spondylosis without myelopathy or radiculopathy, lumbar region: Secondary | ICD-10-CM

## 2018-12-09 DIAGNOSIS — M25511 Pain in right shoulder: Secondary | ICD-10-CM

## 2018-12-09 DIAGNOSIS — F1190 Opioid use, unspecified, uncomplicated: Secondary | ICD-10-CM | POA: Diagnosis not present

## 2018-12-09 DIAGNOSIS — K5090 Crohn's disease, unspecified, without complications: Secondary | ICD-10-CM

## 2018-12-09 DIAGNOSIS — G8929 Other chronic pain: Secondary | ICD-10-CM

## 2018-12-09 DIAGNOSIS — R109 Unspecified abdominal pain: Secondary | ICD-10-CM

## 2018-12-09 MED ORDER — OXYCODONE-ACETAMINOPHEN 10-325 MG PO TABS: 1.0000 | ORAL_TABLET | Freq: Three times a day (TID) | ORAL | 0 refills | Status: DC | PRN
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
## 2018-12-09 NOTE — Addendum Note (Signed)
Addended by: [name] on: 12/09/2018 02:53 PM   Modules accepted: Orders

## 2018-12-09 NOTE — Telephone Encounter (Signed)
Spoke with patient and the oxycodone - apap 10-325 mg was sent x 2 again to [facility] where the qty is low, instead of the requested [facility].  I have communicated to Dr Andree Elk to please send again to [facility] and have taken [facility] out of the profile.

## 2018-12-09 NOTE — Telephone Encounter (Signed)
I did call patient back after communication with DR Andree Elk to let him know that all info has been given and DR Andree Elk should have already sent the medication for him.  Apologized for the confusion.

## 2018-12-09 NOTE — Telephone Encounter (Signed)
Have sent voicemail to Dr Andree Elk to resend this Rx to [facility] in [place],  I did check with them and they do have the qty needed.

## 2018-12-09 NOTE — Telephone Encounter (Signed)
The patient called back to see if Dr. Andree Elk had called it in. I told him when Dr. Andree Elk got here I would ask him and call him back.

## 2018-12-09 NOTE — Telephone Encounter (Signed)
Patient needs pain medicine sent to [facility] in [place] because [facility] does not have the medicine.  I have included [facility] in [place] in his profile.

## 2018-12-10 ENCOUNTER — Telehealth: Payer: Self-pay | Admitting: *Deleted

## 2018-12-10 ENCOUNTER — Telehealth: Payer: Self-pay | Admitting: Anesthesiology

## 2018-12-10 MED ORDER — OXYCODONE-ACETAMINOPHEN 10-325 MG PO TABS: 1.0000 | ORAL_TABLET | Freq: Three times a day (TID) | ORAL | 0 refills | Status: DC | PRN

## 2018-12-10 MED ORDER — OXYCODONE-ACETAMINOPHEN 10-325 MG PO TABS: 1.0000 | ORAL_TABLET | Freq: Three times a day (TID) | ORAL | 0 refills | Status: AC | PRN

## 2018-12-10 NOTE — Addendum Note (Signed)
Addended by: [name] on: 12/10/2018 11:44 AM   Modules accepted: Orders

## 2018-12-10 NOTE — Telephone Encounter (Signed)
[id number] Sent text to Dr. Andree Elk reminding him to send scripts.Benjamin Murillo

## 2018-12-10 NOTE — Telephone Encounter (Signed)
Patient lvmail 12-09-18 at 6:03 pm stating his pharmacy did not get the script for his meds. Please call

## 2018-12-10 NOTE — Telephone Encounter (Signed)
1000am Called Dr. Andree Elk cell and left VM for him to send scripts and call us.

## 2018-12-11 ENCOUNTER — Ambulatory Visit
Admit: 2018-12-11 | Discharge: 2018-12-21 | Payer: MEDICARE | Attending: Rehabilitative and Restorative Service Providers" | Primary: Rehabilitative and Restorative Service Providers"

## 2018-12-11 NOTE — Unmapped (Signed)
[facility]  OUTPATIENT PHYSICAL THERAPY  12/11/2018          Patient Name: Rodney Bender [facility]  Date of Birth:08/31/1970  Diagnosis:   Encounter Diagnoses   Name Primary?   ??? Chronic right shoulder pain Yes   ??? Traumatic tear of right rotator cuff, unspecified tear extent, subsequent encounter      Referring MD:  Gonzella Lex, MD     Plan of Care Effective Date:       Assessment & Plan     Assessment details: Pt reported slightly less pain during this session but continues to have significant R UE tenderness. Pt demonstrated a significant increase in R shoulder PROM, including flex PROM to 140 deg. To facilitate further progress with ROM, will plan to schedule additional appt next week. Will plan to introduce scapular and R UE strengthening exercises in next session if pt tolerates.       Impairments: decreased range of motion, decreased strength, pain and postural weakness                Clinical Presentation: stable  Clinical Decision Making: low    Prognosis: good      Negative Prognosis Rationale: Pain Status and chronicity of condition.        Therapy Goals  Goals: In 2 weeks, pt will be independent with home exercise program-MET  In 6 weeks, pt will demonstrate 140 deg of R shld flex PROM and 80 deg of shld abd PROM-MET  In 12 weeks, pt will demonstrate R shld AROM WFL without pain in order to return to previous functional and recreational activities      Plan  Therapy options: will be seen for skilled physical therapy services    Planned therapy interventions: manual therapy, neuromuscular re-education, postural training, body mechanics training, education - patient, therapeutic exercises and home exercise program        Frequency: 1x week (1-2x week)    Duration in weeks: 12    Education provided to: patient.  Education provided: importance of Therapy and HEP  Education Results: verbalized good understanding. Next visit plan: continue to progress PROM and therapeutic exercise per protocol  Total Session Time: 45  Treatment rendered today: Manual therapy (30 min)  Supine R elbow flex full PROM  Supine R shld ER PROM 0-40 deg  Supine R shld flex PROM 0-140 deg  Supine R shld abd PROM 0-90 deg      Therapeutic exercise (15 min)  Seated shld flex AAROM towel slide-3x30 sec  Seated shld abd AAROM towel slide-5x10 sec  Supine ER stretch x30 sec    HEP:   Seated shld flex/abd AAROM with towel  Supine ER stretch  AAROM chest press, shld flex, ER, abd with rod  R shld pendulum  R elbow flex/ext AROM   R Wrist AROM in all directions  Plan details: Will progress per RCR protocol          Subjective     History of Present Illness  Date of Onset: 10/22/2013  Date of Surgery: 10/02/2018  Date of Evaluation: 10/23/2018  Surgical Procedure: RCR (R RCR)  Reason for Referral/Chief Complaint: R shoulder pain- postop R rotator cuff surgery      Subjective: Shoulder pain has slightly improved overall but still experiences significant pain with shoulder movement. Pain is typically higher when sleeping at night. Has performed home exercises consistently. Using sling less during the day and doffs sling for home exercises and  some functional activities. Often uses sling at night when awaken by severe pain. Ice has generally been helpful for pain.       Quality of life: good          Pain      Current pain rating: 7      At best pain rating: 3      At worst pain rating: 9      Location: R shoulder and lateral R UE to hand      Quality: aching, sharp, numbing, tingling and throbbing (numbness in R hand)      Relieving factors: medications and ice      Exacerbated by: Pain during sleep in any position.      Pain Related Behaviors: none    Progression: improved (Does not have pressure sensation in posterior R shoulder as before surgery)              Prior Functional Status: Physical Limitation(s)-work and recreational activities Current functional status: disturbed sleep, limited lifting and limited recreation      Precautions and Equipment  : postop protocol.  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    Postural Observations  Seated posture: fair  Standing posture: fair        Palpation     Right   No palpable tenderness to the biceps and wrist extensors.   Hypertonic in the upper trapezius. Tenderness of the anterior deltoid, middle deltoid, posterior deltoid, triceps and upper trapezius.     Tenderness     Right Shoulder  Tenderness in the [facility] joint.     Additional Tenderness Details  Significant tenderness in R AC joint and R posterolateral shoulder upon light touch    Active Range of Motion   Left Shoulder   Flexion: WFL  Abduction: WFL    Left Elbow   Flexion: WFL  Extension: WFL    Right Elbow   Flexion: WFL and with pain  Extension: WFL and with pain    Left Wrist   Normal active range of motion    Right Wrist   Normal active range of motion    Passive Range of Motion     Right Shoulder   Flexion: 140 degrees with pain  Abduction: 90 degrees with pain  External rotation 0??: 40 degrees with pain  Internal rotation 45??: 45 degrees     Right Elbow   Flexion: WFL and with pain  Extension: WFL    Right Wrist   Normal passive range of motion    Strength/Myotome Testing     Left Shoulder     Planes of Motion Abduction: 5   External  rotation at 0??: 5   Internal rotation at 0??: 5     Left Elbow   Flexion: 5  Extension: 5    Left Wrist/Hand   Wrist extension: 5  Wrist flexion: 5    Right Wrist/Hand   Wrist extension: 5  Wrist flexion: 5    General Comments     Shoulder Comments   R UE skin warm to the touch                            I attest that I have reviewed the above information.  Signed: Lanae Crumbly, PT  12/11/2018 5:14 PM

## 2018-12-15 NOTE — Unmapped (Signed)
[facility] PT [facility]  OUTPATIENT PHYSICAL THERAPY  12/14/2018          Patient Name: Rodney Bender [facility]  Date of Birth:[birth year]  Diagnosis:   Encounter Diagnoses   Name Primary?   ??? Chronic right shoulder pain Yes   ??? Traumatic tear of right rotator cuff, unspecified tear extent, subsequent encounter      Referring MD:  Gonzella Lex, MD     Plan of Care Effective Date:       Assessment & Plan     Assessment details: Pt continues to report a relatively high level of pain. Demonstrated similar PROM in flex and abd due to pain. Initiated shoulder and scap strengthening exercises to pt's tolerance. Will continue to slowly progress ROM and strengthening activities.       Impairments: decreased range of motion, decreased strength, pain and postural weakness                Clinical Presentation: stable  Clinical Decision Making: low    Prognosis: good      Negative Prognosis Rationale: Pain Status and chronicity of condition.        Therapy Goals  Goals: In 2 weeks, pt will be independent with home exercise program-MET  In 6 weeks, pt will demonstrate 140 deg of R shld flex PROM and 80 deg of shld abd PROM-MET  In 12 weeks, pt will demonstrate R shld AROM WFL without pain in order to return to previous functional and recreational activities      Plan  Therapy options: will be seen for skilled physical therapy services    Planned therapy interventions: manual therapy, neuromuscular re-education, postural training, body mechanics training, education - patient, therapeutic exercises and home exercise program        Frequency: 1x week (1-2x week)    Duration in weeks: 12    Education provided to: patient.  Education provided: importance of Therapy and HEP  Education Results: verbalized good understanding.    Next visit plan: continue to progress PROM and therapeutic exercise per protocol  Total Session Time: 38  Treatment rendered today: Manual therapy (20 min)  Supine R elbow flex full PROM Supine R shld ER PROM at 0, 30, and 45 deg abd: 0-40, 0-30, 0-25 deg, respectively  Supine R shld flex PROM 0-140 deg  Supine R shld abd PROM 0-90 deg  Shoulder functional ER PROM to T1  Shoulder functional IR PROM to L3    Therapeutic exercise (18 min)   Shoulder functional IR AAROM stretch with towel  Shoulder ER/IR isometrics against wall: x5  R biceps curls 2x10  Scapular retraction: 2x15- mod verbal/tactile cueing for appropriate scap activation    HEP:   Seated shld flex/abd/functional IR AAROM with towel  Shoulder ER/IR isometrics  Scap ret  Biceps curls  Supine ER stretch  AAROM chest press, shld flex, ER, abd with rod  R shld pendulum  R elbow flex/ext AROM   R Wrist AROM in all directions  Plan details: Will progress per RCR protocol          Subjective     History of Present Illness  Date of Onset: 10/22/2013  Date of Surgery: 10/02/2018  Date of Evaluation: 10/23/2018  Surgical Procedure: RCR (R RCR)  Reason for Referral/Chief Complaint: R shoulder pain- postop R rotator cuff surgery      Subjective: Shoulder pain similar to last time. Continues to use ice on shoulder which is helpful. Still uses sling  occasionally throughout day and night but also purchased a shoulder brace, which pt notes has been helpful for pain.       Quality of life: good          Pain      Current pain rating: 7      At best pain rating: 3      At worst pain rating: 9      Location: R shoulder and lateral R UE to hand      Quality: aching, sharp, numbing, tingling and throbbing (numbness in R hand)      Relieving factors: medications and ice      Exacerbated by: Pain during sleep in any position.      Pain Related Behaviors: none    Progression: improved (Does not have pressure sensation in posterior R shoulder as before surgery)              Prior Functional Status: Physical Limitation(s)-work and recreational activities    Current functional status: disturbed sleep, limited lifting and limited recreation Precautions and Equipment  : postop protocol.  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    Postural Observations  Seated posture: fair  Standing posture: fair        Palpation     Right   No palpable tenderness to the biceps and wrist extensors.   Hypertonic in the upper trapezius. Tenderness of the anterior deltoid, middle deltoid, posterior deltoid, triceps and upper trapezius.     Tenderness     Right Shoulder  Tenderness in the [facility] joint.     Additional Tenderness Details  Significant tenderness in R AC joint and R posterolateral shoulder upon light touch    Active Range of Motion   Left Shoulder   Flexion: WFL  Abduction: WFL    Left Elbow   Flexion: WFL  Extension: WFL    Right Elbow   Flexion: WFL and with pain  Extension: WFL and with pain    Left Wrist   Normal active range of motion    Right Wrist   Normal active range of motion    Passive Range of Motion     Right Shoulder   Flexion: 140 degrees with pain  Abduction: 90 degrees with pain  External rotation 0??: 40 degrees with pain  External rotation 45??: 25 degrees with pain  Internal rotation 45??: 45 degrees     Right Elbow   Flexion: WFL and with pain  Extension: WFL    Right Wrist   Normal passive range of motion    Additional Passive Range of Motion Details  Functional ER to T1 and IR to L3  Strength/Myotome Testing Left Shoulder     Planes of Motion   Abduction: 5   External rotation at 0??: 5   Internal rotation at 0??: 5     Left Elbow   Flexion: 5  Extension: 5    Left Wrist/Hand   Wrist extension: 5  Wrist flexion: 5    Right Wrist/Hand   Wrist extension: 5  Wrist flexion: 5    General Comments     Shoulder Comments   R UE skin warm to the touch                            I attest that I have reviewed the above information.  Signed: Lanae Crumbly, PT  12/14/2018 5:21 PM

## 2018-12-17 ENCOUNTER — Ambulatory Visit: Admit: 2018-12-17 | Discharge: 2018-12-18 | Payer: MEDICARE
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
## 2018-12-17 NOTE — Unmapped (Signed)
I saw and evaluated the patient, participating in the key portions of the service.  I reviewed the resident???s note.  I agree with the resident???s findings and plan. Cruz Condon, MD

## 2018-12-18 NOTE — Unmapped (Signed)
[facility] ALLIED HEALTH PT [facility]  OUTPATIENT PHYSICAL THERAPY  12/18/2018          Patient Name: Rodney Bender [facility]  Date of Birth:Feb 21, 1970  Diagnosis:   Encounter Diagnoses   Name Primary?   ??? Chronic right shoulder pain Yes   ??? Traumatic tear of right rotator cuff, unspecified tear extent, subsequent encounter      Referring MD:  Gonzella Lex, MD     Plan of Care Effective Date:       Assessment & Plan     Assessment details: Reported similar level of pain as last session. Reviewed previous HEP and added some new AAROM stretches. Will continue to focus on gradually improving ROM.       Impairments: decreased range of motion, decreased strength, pain and postural weakness                Clinical Presentation: stable  Clinical Decision Making: low    Prognosis: good      Negative Prognosis Rationale: Pain Status and chronicity of condition.        Therapy Goals  Goals: In 2 weeks, pt will be independent with home exercise program-MET  In 6 weeks, pt will demonstrate 140 deg of R shld flex PROM and 80 deg of shld abd PROM-MET  In 12 weeks, pt will demonstrate R shld AROM WFL without pain in order to return to previous functional and recreational activities      Plan  Therapy options: will be seen for skilled physical therapy services    Planned therapy interventions: manual therapy, neuromuscular re-education, postural training, body mechanics training, education - patient, therapeutic exercises and home exercise program        Frequency: 1x week (1-2x week)    Duration in weeks: 12    Education provided to: patient.  Education provided: importance of Therapy and HEP  Education Results: verbalized good understanding.    Next visit plan: continue to progress PROM and therapeutic exercise per protocol  Total Session Time: 43  Treatment rendered today: Manual therapy (22 min)  Supine R elbow flex full PROM  Supine R shld ER PROM at 0, 45, and 60 deg abd: 0-40, 0-25, 0-20 deg, respectively Supine R shld flex PROM 0-140 deg  Supine R shld abd PROM 0-105 deg  R shld horiz add PROM    Therapeutic exercise (21 min)   Shoulder abd AAROM with rod standing x10  Shoulder functional IR AAROM stretch with towel x10  Shoulder horiz add AAROM stretch with towel x10  Shoulder ext with rod AAROM x10  Shoulder ER/IR isometrics against wall: x10  Scapular retraction: 2x10- min verbal/tactile cueing for appropriate scap activation  Supine ER stretch 2x30 sec    HEP:   Seated shld flex/abd/functional IR/horiz add AAROM with towel  Shoulder ER/IR isometrics  Scap ret  Biceps curls  Supine ER stretch  AAROM chest press, shld flex, ER, abd with rod  R shld pendulum  R elbow flex/ext, wrist AROM  Plan details: Will progress per RCR protocol          Subjective     History of Present Illness  Date of Onset: 10/22/2013  Date of Surgery: 10/02/2018  Date of Evaluation: 10/23/2018  Surgical Procedure: RCR (R RCR)  Reason for Referral/Chief Complaint: R shoulder pain- postop R rotator cuff surgery      Subjective: Continues to report relatively high level of pain. Saw Ortho yesterday and was discontinued from sling. Still often uses  ice to relieve pain. Has been consistent with HEP.     Quality of life: good          Pain      Current pain rating: 7      At best pain rating: 3      At worst pain rating: 9      Location: R shoulder and lateral R UE to hand      Quality: aching, sharp, numbing, tingling and throbbing (numbness in R hand)      Relieving factors: medications and ice      Exacerbated by: Pain during sleep in any position.      Pain Related Behaviors: none    Progression: improved (Does not have pressure sensation in posterior R shoulder as before surgery)              Prior Functional Status: Physical Limitation(s)-work and recreational activities    Current functional status: disturbed sleep, limited lifting and limited recreation      Precautions and Equipment  : postop protocol. 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    Postural Observations  Seated posture: fair  Standing posture: fair        Palpation     Right   No palpable tenderness to the biceps and wrist extensors.   Hypertonic in the upper trapezius. Tenderness of the anterior deltoid, middle deltoid, posterior deltoid, triceps and upper trapezius.     Tenderness     Right Shoulder  Tenderness in the [facility] joint.     Additional Tenderness Details  Significant tenderness in R AC joint and R posterolateral shoulder upon light touch    Active Range of Motion   Left Shoulder   Flexion: WFL  Abduction: WFL    Left Elbow   Flexion: WFL  Extension: WFL    Right Elbow   Flexion: WFL and with pain  Extension: WFL and with pain    Left Wrist   Normal active range of motion    Right Wrist   Normal active range of motion    Passive Range of Motion     Right Shoulder   Flexion: 140 degrees with pain  Abduction: 105 degrees with pain  External rotation 0??: 40 degrees with pain  External rotation 45??: 25 degrees with pain  Internal rotation 45??: 45 degrees     Right Elbow   Flexion: WFL and with pain  Extension: WFL    Right Wrist   Normal passive range of motion    Additional Passive Range of Motion Details  Functional ER to T1 and IR to L3    Strength/Myotome Testing  Left Shoulder     Planes of Motion   Abduction: 5 External rotation at 0??: 5   Internal rotation at 0??: 5     Left Elbow   Flexion: 5  Extension: 5    Left Wrist/Hand   Wrist extension: 5  Wrist flexion: 5    Right Wrist/Hand   Wrist extension: 5  Wrist flexion: 5    General Comments     Shoulder Comments   R UE skin warm to the touch                            I attest that I have reviewed the above information.  Signed: Lanae Crumbly, PT  12/18/2018 1:43 PM

## 2018-12-22 NOTE — Unmapped (Signed)
[facility] Refill Coordination Note    Specialty Medication(s) to be Shipped:   Inflammatory Disorders: Stelara    Other medication(s) to be shipped: none     Rodney Bender, DOB: Jun 11, 1970  Phone: [phone] (home)       All above HIPAA information was verified with patient.     Completed refill call assessment today to schedule patient's medication shipment from the [facility] [phone]).       Specialty medication(s) and dose(s) confirmed: Regimen is correct and unchanged.   Changes to medications: Victor reports no changes at this time.  Changes to insurance: No  Questions for the pharmacist: No    Confirmed patient received Welcome Packet with first shipment. The patient will receive a drug information handout for each medication shipped and additional FDA Medication Guides as required.       DISEASE/MEDICATION-SPECIFIC INFORMATION        For patients on injectable medications: Patient currently has 0 doses left.  Next injection is scheduled for 01/05/19.    SPECIALTY MEDICATION ADHERENCE     Medication Adherence    Patient reported X missed doses in the last month: 0  Specialty Medication: Stelara 90 mg/ml  Patient is on additional specialty medications: No                Stelara 90 mg/ml: 0 days of medicine on hand          SHIPPING     Shipping address confirmed in Epic.     Delivery Scheduled: Yes, Expected medication delivery date: 12/30/18.     Medication will be delivered via UPS to the prescription address in Epic WAM.    Unk Lightning   [facility] Specialty Technician

## 2018-12-22 NOTE — Unmapped (Signed)
[facility] ALLIED HEALTH PT [facility]  OUTPATIENT PHYSICAL THERAPY  12/21/2018          Patient Name: Rodney Bender [facility]  Date of Birth:02-02-1970  Diagnosis:   No diagnosis found.  Referring MD:  Gonzella Lex, MD     Plan of Care Effective Date:       Assessment & Plan     Assessment details: Reported slightly improved pain during session. Achieved a lower PROM and increased tightness was noted, but able to achieve 155 deg of AAROM during session. Demonstrated increased ER PROM. Will continue to emphasize ROM, particularly in flex, abd, and ER.       Impairments: decreased range of motion, decreased strength, pain and postural weakness                Clinical Presentation: stable  Clinical Decision Making: low    Prognosis: good      Negative Prognosis Rationale: Pain Status and chronicity of condition.        Therapy Goals  Goals: In 2 weeks, pt will be independent with home exercise program-MET  In 6 weeks, pt will demonstrate 140 deg of R shld flex PROM and 80 deg of shld abd PROM-MET  In 12 weeks, pt will demonstrate R shld AROM WFL without pain in order to return to previous functional and recreational activities      Plan  Therapy options: will be seen for skilled physical therapy services    Planned therapy interventions: manual therapy, neuromuscular re-education, postural training, body mechanics training, education - patient, therapeutic exercises and home exercise program        Frequency: 1x week (1-2x week)    Duration in weeks: 12    Education provided to: patient.  Education provided: importance of Therapy and HEP  Education Results: verbalized good understanding.    Next visit plan: continue to progress PROM and therapeutic exercise per protocol  Total Session Time: 39  Treatment rendered today: Manual therapy (24 min)  Supine R elbow flex full PROM  Supine R shld ER PROM at 0, 45, 60, and 90 deg abd: ~0-50, 0-40, 0-35, 0-30 deg, respectively Supine R shld flex PROM 0-125 deg; additional pain and tightness noted during flex PROM  Supine R shld abd PROM 0-105 deg  R shld horiz add PROM    Therapeutic exercise (15 min)   Shld flex AAROM with rod x10- able to achieve 155 deg  Shoulder functional IR AAROM stretch with towel x10  Supine ER stretch at 90/90 3x30 sec- able to achieve 45 deg  -reinforced importance of improving shld ROM    HEP:   Seated shld flex/abd/functional IR/horiz add AAROM with towel  Shoulder ER/IR isometrics  Scap ret  Biceps curls  Supine ER stretch above head and at 90/90  AAROM chest press, shld flex, ER, abd with rod  R shld pendulum  R elbow flex/ext, wrist AROM  Plan details: Will progress per RCR protocol          Subjective     History of Present Illness  Date of Onset: 10/22/2013  Date of Surgery: 10/02/2018  Date of Evaluation: 10/23/2018  Surgical Procedure: RCR (R RCR)  Reason for Referral/Chief Complaint: R shoulder pain- postop R rotator cuff surgery      Subjective: Slightly improved shld pain today. Has used R UE during daily activities. Shld pain worse when in motion. Still wears OTC shld brace that he purchased.     Quality of life: good  Pain      Current pain rating: 5      At best pain rating: 5      At worst pain rating: 9      Location: R shoulder and lateral R UE to hand      Quality: aching, sharp, numbing, tingling and throbbing (numbness in R hand)      Relieving factors: medications and ice      Exacerbated by: Pain during sleep in any position.      Pain Related Behaviors: none    Progression: improved (Does not have pressure sensation in posterior R shoulder as before surgery)              Prior Functional Status: Physical Limitation(s)-work and recreational activities    Current functional status: disturbed sleep, limited lifting and limited recreation      Precautions and Equipment  : postop protocol.  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    Postural Observations  Seated posture: fair  Standing posture: fair        Palpation     Right   No palpable tenderness to the biceps and wrist extensors.   Hypertonic in the upper trapezius. Tenderness of the anterior deltoid, middle deltoid, posterior deltoid, triceps and upper trapezius.     Tenderness     Right Shoulder  Tenderness in the [facility] joint.     Additional Tenderness Details  Significant tenderness in R AC joint and R posterolateral shoulder upon light touch    Active Range of Motion   Left Shoulder   Flexion: WFL  Abduction: WFL    Left Elbow   Flexion: WFL  Extension: WFL    Right Elbow   Flexion: WFL and with pain  Extension: WFL and with pain    Left Wrist   Normal active range of motion    Right Wrist   Normal active range of motion    Passive Range of Motion     Right Shoulder   Flexion: 140 degrees with pain  Abduction: 105 degrees with pain  External rotation 0??: 50 degrees with pain  External rotation 45??: 40 degrees with pain  External rotation 90??: 30 degrees   Internal rotation 45??: 60 degrees     Right Elbow   Flexion: WFL and with pain  Extension: WFL    Right Wrist   Normal passive range of motion    Additional Passive Range of Motion Details  Functional ER to T1 and IR to L3    Strength/Myotome Testing     Left Shoulder     Planes of Motion   Abduction: 5   External rotation at  0??: 5 Internal rotation at 0??: 5     Left Elbow   Flexion: 5  Extension: 5    Left Wrist/Hand   Wrist extension: 5  Wrist flexion: 5    Right Wrist/Hand   Wrist extension: 5  Wrist flexion: 5                            I attest that I have reviewed the above information.  Signed: Lanae Crumbly, PT  12/21/2018 4:33 PM

## 2018-12-26 MED ORDER — DRONABINOL 10 MG CAPSULE
ORAL_CAPSULE | 0 refills | 0 days | Status: CP
Start: 2018-12-26 — End: ?

## 2018-12-31 MED FILL — STELARA 90 MG/ML SUBCUTANEOUS SYRINGE: SUBCUTANEOUS | 56 days supply | Qty: 1 | Fill #2

## 2018-12-31 MED FILL — STELARA 90 MG/ML SUBCUTANEOUS SYRINGE: 56 days supply | Qty: 1 | Fill #2 | Status: AC

## 2019-01-01 ENCOUNTER — Ambulatory Visit
Admit: 2019-01-01 | Discharge: 2019-01-20 | Payer: MEDICARE | Attending: Rehabilitative and Restorative Service Providers" | Primary: Rehabilitative and Restorative Service Providers"

## 2019-01-01 NOTE — Unmapped (Signed)
[place] ALLIED HEALTH PT [facility]  OUTPATIENT PHYSICAL THERAPY  01/01/2019  Note Type: Re-evaluation/Progress Note  Progress Note Date Range: 10/23/2018-01/01/2019    Patient Name: Rodney Bender [facility]  Date of Birth:1971-01-10  Diagnosis:   Encounter Diagnoses   Name Primary?   ??? Chronic right shoulder pain Yes   ??? Traumatic tear of right rotator cuff, unspecified tear extent, subsequent encounter      Referring MD:  Gonzella Lex, MD     Plan of Care Effective Date:       Assessment & Plan     Assessment details: Pt demonstrated increased PROM in shld flex, abd and ER since previous session. Continues to report moderate pain (although gradually improving) and anterolateral shld tenderness and also reports recent onset of painful popping sensation intermittently when actively moving R shld. Pt demonstrated improved ROM while performing wall slide exercises. Emphasized continuing to perform active shld movement during daily activities, including overhead movement. Pt will continue to benefit from skilled PT services to improve pain, ROM, and strength. Will continue to focus on ROM therex. Will also further emphasize shld and scapular strengthening as tol.       Impairments: decreased range of motion, decreased strength, pain and postural weakness                Clinical Presentation: stable  Clinical Decision Making: low    Prognosis: good      Negative Prognosis Rationale: Pain Status and chronicity of condition.        Therapy Goals  Goals: In 2 weeks, pt will be independent with home exercise program-MET  In 6 weeks, pt will demonstrate 140 deg of R shld flex PROM and 80 deg of shld abd PROM-MET  In 12 weeks, pt will demonstrate R shld AROM WFL without pain in order to return to previous functional and recreational activities-PROGRESSING      Plan  Therapy options: will be seen for skilled physical therapy services Planned therapy interventions: manual therapy, neuromuscular re-education, postural training, body mechanics training, education - patient, therapeutic exercises and home exercise program        Frequency: 1x week (1-2x week)    Duration in weeks: 12    Education provided to: patient.  Education provided: importance of Therapy and HEP  Education Results: verbalized good understanding.    Next visit plan: continue to progress PROM and therapeutic exercise per protocol  Total Session Time: 45  Treatment rendered today: Manual therapy (20 min)  Supine R shld ER PROM at 0, 45, 60, and 90 deg abd: up to 70-80 deg  Supine R shld flex PROM 0-160 deg  Supine R shld abd PROM 0-125 deg    Therapeutic exercise (25 min)   -reassessment of symptoms and AROM  -shld flex wall slide with towel x5- able to achieve 145 deg  -shld abd wall slide with towel x10- able to achieve 135 deg  -shld isometrics flex and ext x10 each  -shld AROM in flex and abd x10 each    HEP:   Seated shld flex/abd/functional IR/horiz add AAROM with towel  Shld flex/abd wall slides  Shoulder flex, ext, ER/IR isometrics  Scap ret  Biceps curls  Supine ER stretch above head and at 90/90  AAROM chest press, shld flex, ER, abd with rod  R shld pendulum  Plan details: Will progress per RCR protocol          Subjective     History of Present Illness  Date  of Onset: 10/22/2013  Date of Surgery: 10/02/2018  Date of Evaluation: 10/23/2018  Surgical Procedure: RCR (R RCR)  Reason for Referral/Chief Complaint: R shoulder pain- postop R rotator cuff surgery      Subjective: Slightly improved shld pain overall. Uses R UE for daily activities such as washing dishes. Uses brace intermittently, mostly at night. Has felt painful popping in R shld intermittently when actively moving R UE during the past couple of weeks.      Quality of life: good          Pain      Current pain rating: 5      At best pain rating: 3      At worst pain rating: 9 Location: R shoulder and lateral R UE to hand      Quality: aching, sharp, numbing, tingling and throbbing (numbness in R hand)      Relieving factors: medications and ice      Exacerbated by: Pain during sleep in any position.      Pain Related Behaviors: none    Progression: improved (Does not have pressure sensation in posterior R shoulder as before surgery)              Prior Functional Status: Physical Limitation(s)-work and recreational activities    Current functional status: disturbed sleep, limited lifting and limited recreation      Precautions and Equipment  : postop protocol.  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    Postural Observations  Seated posture: fair  Standing posture: fair        Palpation     Right   No palpable tenderness to the biceps and wrist extensors.   Hypertonic in the upper trapezius. Tenderness of the anterior deltoid, middle deltoid, posterior deltoid, triceps and upper trapezius.     Tenderness     Right Shoulder  Tenderness in the [facility] joint.     Additional Tenderness Details  Significant tenderness in R AC joint and R posterolateral shoulder upon light touch    Active Range of Motion   Left Shoulder   Flexion: WFL  Abduction: WFL  External rotation BTH: T2 with pain  Internal rotation BTB: T10     Right Shoulder   Flexion: 90 degrees with pain Abduction: 60 degrees with pain  External rotation BTH: T2 with pain  Internal rotation BTB: L2     Left Elbow   Flexion: WFL  Extension: WFL    Right Elbow   Flexion: WFL and with pain  Extension: WFL and with pain    Left Wrist   Normal active range of motion    Right Wrist  Normal active range of motion    Passive Range of Motion     Right Shoulder   Flexion: 160 degrees with pain  Abduction: 125 degrees with pain  External rotation 0??: 80 degrees with pain  External rotation 45??: 70 degrees with pain  External rotation 90??: 70 degrees   Internal rotation 45??: 60 degrees     Right Elbow   Flexion: WFL and with pain  Extension: WFL    Right Wrist   Normal passive range of motion    Strength/Myotome Testing     Left Shoulder     Planes of Motion   Abduction: 5   External rotation at 0??: 5   Internal rotation at 0??: 5     Right Shoulder     Planes of Motion   External rotation at 0??: 4   Internal rotation at 0??: 4     Left Elbow   Flexion: 5  Extension: 5    Right Elbow   Flexion: 4  Extension: 4    Left Wrist/Hand   Wrist extension: 5  Wrist flexion: 5    Right Wrist/Hand   Wrist extension: 5  Wrist flexion: 5    Additional Strength Details  Pain with R elbow MMT                            I attest that I have reviewed the above information.  Signed: Lanae Crumbly, PT  01/01/2019 1:39 PM

## 2019-01-07 MED ORDER — DRONABINOL 10 MG CAPSULE
ORAL_CAPSULE | 0 refills | 0 days | Status: CP
Start: 2019-01-07 — End: ?

## 2019-01-11 NOTE — Unmapped (Signed)
[facility] PT [facility]  OUTPATIENT PHYSICAL THERAPY  01/11/2019          Patient Name: Rodney Bender  Date of Birth:1970-11-04  Diagnosis:   Encounter Diagnoses   Name Primary?   ??? Chronic right shoulder pain Yes   ??? Traumatic tear of right rotator cuff, unspecified tear extent, subsequent encounter      Referring MD:  Gonzella Lex, MD     Plan of Care Effective Date:       Assessment & Plan     Assessment details: Pt's pain has significantly improved since previous session. Demonstrated increase in AAROM through towel slides. Focused on strengthening exercises during session, which pt tolerated without a significant increase in pain. Pt also tolerated a low resistance on arm ergometer. Will continue to focus on improving shoulder ROM and strength to work towards pt's functional goals.      Impairments: decreased range of motion, decreased strength, pain and postural weakness                Clinical Presentation: stable  Clinical Decision Making: low    Prognosis: good      Negative Prognosis Rationale: Pain Status and chronicity of condition.        Therapy Goals  Goals: In 2 weeks, pt will be independent with home exercise program-MET  In 6 weeks, pt will demonstrate 140 deg of R shld flex PROM and 80 deg of shld abd PROM-MET  In 12 weeks, pt will demonstrate R shld AROM WFL without pain in order to return to previous functional and recreational activities-PROGRESSING      Plan  Therapy options: will be seen for skilled physical therapy services    Planned therapy interventions: manual therapy, neuromuscular re-education, postural training, body mechanics training, education - patient, therapeutic exercises and home exercise program        Frequency: 1x week (1-2x week)    Duration in weeks: 12    Education provided to: patient.  Education provided: importance of Therapy and HEP  Education Results: verbalized good understanding. Next visit plan: continue to progress PROM and therapeutic exercise per protocol  Total Session Time: 44  Treatment rendered today: Manual therapy (12 min)  Supine R shld ER PROM at 45, 60, and 90 deg abd: up to 70-80 deg  Supine R shld flex PROM 0-160 deg  Supine R shld abd PROM 0-130 deg    Therapeutic exercise (32 min)   -shld flex wall slide with towel x5- able to achieve 155 deg  -shld abd wall slide with towel x5- able to achieve 150 deg  -shoulder ER in sidelying with 3lb weight 2x10  -shoulder extension in prone with 3lb weight 2x10  -row with red theraband x10- v/t cues to maintain appropriate scapular activation  -shoulder ext with red theraband x10  -shoulder ER/IR with red theraband x10  -shoulder flex with 3lb weight x10  -shoulder abd with 3lb weight x10  -arm ergometer x8 min: 5 min at 2.0, 3 min at 3.0 resistance  advised pt to perform strengthening exercises once every other day if significant pain or soreness develops    HEP:   Seated shld flex/abd/functional IR/horiz add AAROM with towel  Shld flex/abd wall slides  Shoulder flex, ext, ER/IR isometrics  Biceps curls  Supine ER stretch above head and at 90/90  AAROM chest press, shld flex, ER, abd with rod  R shld pendulum  ER and shld ext with weight  Rows, shld  ext, ER, IR with band  shld flex, abd with weight  Plan details: Will progress per RCR protocol          Subjective     History of Present Illness  Date of Onset: 10/22/2013  Date of Surgery: 10/02/2018  Date of Evaluation: 10/23/2018  Surgical Procedure: RCR (R RCR)  Reason for Referral/Chief Complaint: R shoulder pain- postop R rotator cuff surgery      Subjective: Shoulder is feeling better than last time. Able to use shoulder more for daily activities. Still feels intermittent popping when actively moving shoulder. Using shoulder brace less frequently. Home exercises are still going well. 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    Quality of life: good          Pain Current pain rating: 3      At best pain rating: 3      At worst pain rating: 7      Location: R shoulder and lateral R UE to hand      Quality: aching, sharp, numbing, tingling and throbbing (numbness in R hand)      Relieving factors: medications and ice      Exacerbated by: Pain during sleep in any position.      Pain Related Behaviors: none    Progression: improved (Does not have pressure sensation in posterior R shoulder as before surgery)              Prior Functional Status: Physical Limitation(s)-work and recreational activities    Current functional status: disturbed sleep, limited lifting and limited recreation      Precautions and Equipment  : postop protocol.  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    Postural Observations  Seated posture: fair  Standing posture: fair        Palpation     Right   No palpable tenderness to the biceps and wrist extensors.   Hypertonic in the upper trapezius. Tenderness of the anterior deltoid, middle deltoid, posterior deltoid, triceps and upper trapezius.     Tenderness     Right Shoulder  Tenderness in the [facility] joint.     Additional Tenderness Details  Significant tenderness in R AC joint and R posterolateral shoulder upon light touch    Active Range of Motion   Left Shoulder   Flexion: WFL  Abduction: WFL  External rotation BTH: T2 with pain Internal rotation BTB: T10     Right Shoulder   Flexion: 90 degrees with pain  Abduction: 60 degrees with pain  External rotation BTH: T2 with pain  Internal rotation BTB: L2     Left Elbow   Flexion: [facility]  Extension: WFL    Right Elbow   Flexion: WFL and with pain  Extension: WFL and with pain    Left Wrist   Normal active range of motion    Right Wrist   Normal active range of motion    Passive Range of Motion     Right Shoulder   Flexion: 160 degrees with pain  Abduction: 130 degrees with pain  External rotation 0??: 80 degrees with pain  External rotation 45??: 70 degrees with pain  External rotation 90??: 70 degrees   Internal rotation 45??: 60 degrees     Right Elbow   Flexion: WFL and with pain  Extension: WFL    Right Wrist   Normal passive range of motion    Strength/Myotome Testing     Left Shoulder     Planes of Motion   Abduction: 5   External rotation at 0??: 5   Internal rotation at 0??: 5     Right Shoulder     Planes of Motion   External rotation at 0??: 4   Internal rotation at 0??: 4     Left Elbow   Flexion: 5  Extension: 5    Right Elbow   Flexion: 4  Extension: 4    Left Wrist/Hand   Wrist extension: 5  Wrist flexion: 5    Right Wrist/Hand   Wrist extension: 5  Wrist flexion: 5    Additional Strength Details  Pain with R elbow MMT                            I attest that I have reviewed the above information.  Signed: Lanae Crumbly, PT  01/11/2019 2:27 PM

## 2019-01-18 NOTE — Unmapped (Signed)
[facility] PT [facility]  OUTPATIENT PHYSICAL THERAPY  01/18/2019          Patient Name: Rodney Bender  Date of Birth:08/12/1970  Diagnosis:   Encounter Diagnoses   Name Primary?   ??? Chronic right shoulder pain Yes   ??? Traumatic tear of right rotator cuff, unspecified tear extent, subsequent encounter      Referring MD:  Gonzella Lex, MD     Plan of Care Effective Date:       Assessment & Plan     Assessment details: Pt reported significantly improved pain (0/10 at start of session). Pt demonstrated significantly improved shld flex and abd PROM and AAROM during session. Continues to report popping sensation in shld that is often coincided with pain. Will focus on achieving end-range ROM in all directions and gradually progressing strengthening exercises as tolerated. Deferred some strengthening exercises from HEP due to painful popping.       Impairments: decreased range of motion, decreased strength, pain and postural weakness                Clinical Presentation: stable  Clinical Decision Making: low    Prognosis: good      Negative Prognosis Rationale: Pain Status and chronicity of condition.        Therapy Goals  Goals: In 2 weeks, pt will be independent with home exercise program-MET  In 6 weeks, pt will demonstrate 140 deg of R shld flex PROM and 80 deg of shld abd PROM-MET  In 12 weeks, pt will demonstrate R shld AROM WFL without pain in order to return to previous functional and recreational activities-PROGRESSING      Plan  Therapy options: will be seen for skilled physical therapy services    Planned therapy interventions: manual therapy, neuromuscular re-education, postural training, body mechanics training, education - patient, therapeutic exercises and home exercise program        Frequency: 1x week (1-2x week)    Duration in weeks: 12    Education provided to: patient.  Education provided: importance of Therapy and HEP  Education Results: verbalized good understanding. Next visit plan: continue to progress PROM and therapeutic exercise per protocol  Total Session Time: 38  Treatment rendered today: Manual therapy (18 min)  Supine R shld ER PROM at 45, 60, and 90 deg abd: up to 70-80 deg  Supine R shld flex PROM 0-170 deg  Supine R shld abd PROM 0-165 deg    Therapeutic exercise (20 min)   -shld flex wall slide with towel x5- able to achieve 170 deg  -shld abd wall slide with towel x5- able to achieve 165 deg  -chest press and fly with 5lb weight x10 each- reported painful popping in shld after exercise   -shoulder ER with red theraband x5- reported painful popping  -pendulum with 5lb weight  -pt performed shld flex and elbow flex/ext with fishing pole in clinic. Denied significant pain during exercise.  -arm ergometer x8 min: 6 min at 3.0, 1 min at 2.0, 1 min at 1.0 resistance  -advised pt to stop shld ER and IR resistance exercises for now due to painful shld popping.     HEP:   Seated shld flex/abd/functional IR/horiz add AAROM with towel  Shld flex/abd wall slides  Shoulder flex, ext, ER/IR isometrics  Biceps curls  Supine ER stretch above head and at 90/90  AAROM chest press, shld flex, ER, abd with rod  R shld pendulum  ER and shld  ext with weight  Rows, shld ext, ER, IR with band  shld flex, abd with weight  Plan details: Will progress per RCR protocol          Subjective     History of Present Illness  Date of Onset: 10/22/2013  Date of Surgery: 10/02/2018  Date of Evaluation: 10/23/2018  Surgical Procedure: RCR (R RCR)  Reason for Referral/Chief Complaint: R shoulder pain- postop R rotator cuff surgery      Subjective: Shoulder is feeling much better. Able to use shld more for functional activities including overhead activities. Able to sleep on R side more often now. Continues to feel popping in R shoulder with some pain when actively moving it, particularly in rotation.    Quality of life: good          Pain      Current pain rating: 0      At best pain rating: 0 At worst pain rating: 3      Location: R shoulder and lateral R UE to hand      Quality: aching, sharp, numbing, tingling and throbbing (numbness in R hand)      Relieving factors: medications and ice      Exacerbated by: Pain during sleep in any position.      Pain Related Behaviors: none    Progression: improved (Does not have pressure sensation in posterior R shoulder as before surgery)              Prior Functional Status: Physical Limitation(s)-work and recreational activities    Current functional status: disturbed sleep, limited lifting and limited recreation      Precautions and Equipment  : postop protocol.  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    Postural Observations  Seated posture: fair  Standing posture: fair        Palpation     Right   No palpable tenderness to the biceps and wrist extensors.   Hypertonic in the upper trapezius. Tenderness of the anterior deltoid, middle deltoid, posterior deltoid, triceps and upper trapezius.     Tenderness     Right Shoulder  Tenderness in the [facility] joint.     Additional Tenderness Details  Significant tenderness in R AC joint and R posterolateral shoulder upon light touch    Active Range of Motion   Left Shoulder   Flexion: WFL  Abduction: WFL  External rotation BTH: T2 with pain  Internal rotation BTB: T10     Right Shoulder Flexion: 90 degrees with pain  Abduction: 60 degrees with pain  External rotation BTH: T2 with pain  Internal rotation BTB: L2  Left Elbow   Flexion: WFL  Extension: WFL    Right Elbow   Flexion: WFL and with pain  Extension: WFL and with pain    Left Wrist   Normal active range of motion    Right Wrist   Normal active range of motion    Passive Range of Motion     Right Shoulder   Flexion: 170 degrees with pain  Abduction: 165 degrees with pain  External rotation 0??: 80 degrees with pain  External rotation 45??: 70 degrees with pain  External rotation 90??: 70 degrees   Internal rotation 45??: 60 degrees     Right Elbow   Flexion: WFL and with pain  Extension: WFL    Right Wrist   Normal passive range of motion    Strength/Myotome Testing     Left Shoulder     Planes of Motion   Abduction: 5   External rotation at 0??: 5   Internal rotation at 0??: 5     Right Shoulder     Planes of Motion   External rotation at 0??: 4   Internal rotation at 0??: 4     Left Elbow   Flexion: 5  Extension: 5    Right Elbow   Flexion: 4  Extension: 4    Left Wrist/Hand   Wrist extension: 5  Wrist flexion: 5    Right Wrist/Hand   Wrist extension: 5  Wrist flexion: 5    Additional Strength Details  Pain with R elbow MMT                            I attest that I have reviewed the above information.  Signed: Lanae Crumbly, PT  01/18/2019 4:32 PM

## 2019-02-01 ENCOUNTER — Ambulatory Visit: Admit: 2019-02-01 | Discharge: 2019-02-02 | Payer: MEDICARE

## 2019-02-01 ENCOUNTER — Ambulatory Visit
Admit: 2019-02-01 | Discharge: 2019-02-19 | Payer: MEDICARE | Attending: Rehabilitative and Restorative Service Providers" | Primary: Rehabilitative and Restorative Service Providers"

## 2019-02-01 NOTE — Unmapped (Signed)
Addended by: Bascom Levels on: 02/01/2019 02:42 PM     Modules accepted: Orders
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
## 2019-02-01 NOTE — Unmapped (Signed)
Addended by: Kathreen Devoid on: 02/01/2019 02:41 PM     Modules accepted: Orders

## 2019-02-02 MED ORDER — OXYCODONE 5 MG TABLET
ORAL_TABLET | Freq: Three times a day (TID) | ORAL | 0 refills | 7 days | Status: CP | PRN
Start: 2019-02-02 — End: ?

## 2019-02-03 ENCOUNTER — Encounter: Payer: Self-pay | Admitting: Anesthesiology

## 2019-02-03 ENCOUNTER — Ambulatory Visit: Payer: Medicare Other | Attending: Anesthesiology | Admitting: Anesthesiology

## 2019-02-03 ENCOUNTER — Other Ambulatory Visit: Payer: Self-pay

## 2019-02-03 DIAGNOSIS — R109 Unspecified abdominal pain: Secondary | ICD-10-CM | POA: Diagnosis not present

## 2019-02-03 DIAGNOSIS — M542 Cervicalgia: Secondary | ICD-10-CM

## 2019-02-03 DIAGNOSIS — G894 Chronic pain syndrome: Secondary | ICD-10-CM

## 2019-02-03 DIAGNOSIS — K5090 Crohn's disease, unspecified, without complications: Secondary | ICD-10-CM

## 2019-02-03 DIAGNOSIS — M47816 Spondylosis without myelopathy or radiculopathy, lumbar region: Secondary | ICD-10-CM | POA: Diagnosis not present

## 2019-02-03 DIAGNOSIS — G8929 Other chronic pain: Secondary | ICD-10-CM

## 2019-02-03 DIAGNOSIS — M25511 Pain in right shoulder: Secondary | ICD-10-CM

## 2019-02-03 DIAGNOSIS — F1190 Opioid use, unspecified, uncomplicated: Secondary | ICD-10-CM

## 2019-02-03 MED ORDER — OXYCODONE-ACETAMINOPHEN 10-325 MG PO TABS: 1.0000 | ORAL_TABLET | Freq: Three times a day (TID) | ORAL | 0 refills | Status: AC | PRN

## 2019-02-03 MED ORDER — OXYCODONE-ACETAMINOPHEN 10-325 MG PO TABS: 1.0000 | ORAL_TABLET | Freq: Three times a day (TID) | ORAL | 0 refills | Status: DC | PRN

## 2019-02-04 NOTE — Unmapped (Signed)
error 

## 2019-02-05 NOTE — Progress Notes (Signed)
Virtual Visit via Telephone Note  I connected with Benjamin Murillo on 02/05/19 at 12:45 PM EST by telephone and verified that I am speaking with the correct person using two identifiers.  Location: Patient: Home Provider: Pain control center   I discussed the limitations, risks, security and privacy concerns of performing an evaluation and management service by telephone and the availability of in person appointments. I also discussed with the patient that there may be a patient responsible charge related to this service. The patient expressed understanding and agreed to proceed.   History of Present Illness: I was able to reach Benjamin Murillo via telephone today.  He was unable to do the video portion of the virtual conference.  He reports that his neck pain low back pain and abdominal pain have been relatively stable recently.  Since our last visit he has been able to take his medications as prescribed with no problems.  No side effects are reported.  He describes good relief with the medicines and better functional lifestyle.  The quality characteristic and distribution of his abdominal pain and low back pain are stable in nature.  He is doing exercises on a daily basis to help with this as well.  He is continue to follow-up with his GI doctors for his abdominal symptoms additionally.  Otherwise he is in his usual state of health with no new contributory changes mentioned today.    Observations/Objective:  Current Outpatient Medications:  .  amlodipine-atorvastatin (CADUET) 10-10 MG tablet, Take 1 tablet by mouth daily., Disp: , Rfl:  .  dronabinol (MARINOL) 10 MG capsule, Take 10 mg by mouth 2 (two) times daily before a meal., Disp: , Rfl:  .  famotidine (PEPCID) 20 MG tablet, Take 20 mg by mouth 2 (two) times daily., Disp: , Rfl:  .  gabapentin (NEURONTIN) 100 MG capsule, Take 1 capsule (100 mg total) by mouth at bedtime., Disp: 30 capsule, Rfl: 1 .  hyoscyamine (LEVSIN, ANASPAZ) 0.125 MG  tablet, Take 0.125 mg by mouth as needed. , Disp: , Rfl:  .  naloxone (NARCAN) nasal spray 4 mg/0.1 mL, For respiratory depression from opioids, Disp: 1 kit, Rfl: 2 .  oxyCODONE-acetaminophen (PERCOCET) 10-325 MG tablet, Take 1 tablet by mouth every 8 (eight) hours as needed for pain., Disp: 75 tablet, Rfl: 0 .  [START ON 03/05/2019] oxyCODONE-acetaminophen (PERCOCET) 10-325 MG tablet, Take 1 tablet by mouth every 8 (eight) hours as needed for pain., Disp: 75 tablet, Rfl: 0 .  prochlorperazine (COMPAZINE) 5 MG tablet, Take 5 mg by mouth every 8 (eight) hours as needed., Disp: , Rfl:  .  STELARA 90 MG/ML SOSY injection, Inject 90 mg as directed., Disp: , Rfl:   Assessment and Plan: 1. Chronic abdominal pain   2. Chronic pain syndrome   3. Chronic, continuous use of opioids   4. Facet arthritis of lumbar region   5. Cervicalgia   6. Crohn's disease without complication, unspecified gastrointestinal tract location ([place])   7. Acute pain of right shoulder   Based on our discussion today and upon review of the [facility] practitioner database information I am going to refill his medications.  This will be for January 6 and February 5.  I want him to continue on his current regimen with return to clinic in 2 months.  I encouraged him to continue with stretching strengthening exercises and should he have any problems with his medication management or his pain syndrome to contact us at the pain control  center.  I also want him to continue follow-up with his GI doctors and medical doctors for his baseline medical care.  Follow Up Instructions:    I discussed the assessment and treatment plan with the patient. The patient was provided an opportunity to ask questions and all were answered. The patient agreed with the plan and demonstrated an understanding of the instructions.   The patient was advised to call back or seek an in-person evaluation if the symptoms worsen or if the condition fails to improve  as anticipated.  I provided 30 minutes of non-face-to-face time during this encounter.   [name], MD

## 2019-02-08 ENCOUNTER — Ambulatory Visit
Admit: 2019-02-08 | Discharge: 2019-02-13 | Disposition: A | Discharge status: Home / Self Care | Payer: MEDICARE | Source: Other Acute Inpatient Hospital

## 2019-02-08 ENCOUNTER — Encounter
Admit: 2019-02-08 | Discharge: 2019-02-13 | Disposition: A | Discharge status: Home / Self Care | Payer: MEDICARE | Source: Other Acute Inpatient Hospital | Attending: Student in an Organized Health Care Education/Training Program

## 2019-02-08 LAB — CBC W/ AUTO DIFF
BASOPHILS RELATIVE PERCENT: 0.5 %
EOSINOPHILS ABSOLUTE COUNT: 0.1 10*9/L (ref 0.0–0.4)
EOSINOPHILS RELATIVE PERCENT: 0.6 %
HEMATOCRIT: 43.3 % (ref 41.0–53.0)
HEMOGLOBIN: 14 g/dL (ref 13.5–17.5)
LARGE UNSTAINED CELLS: 2 % (ref 0–4)
LYMPHOCYTES ABSOLUTE COUNT: 2.5 10*9/L (ref 1.5–5.0)
LYMPHOCYTES RELATIVE PERCENT: 19 %
MEAN CORPUSCULAR HEMOGLOBIN CONC: 32.3 g/dL (ref 31.0–37.0)
MEAN CORPUSCULAR VOLUME: 96 fL (ref 80.0–100.0)
MONOCYTES ABSOLUTE COUNT: 0.8 10*9/L (ref 0.2–0.8)
MONOCYTES RELATIVE PERCENT: 5.7 %
NEUTROPHILS ABSOLUTE COUNT: 9.5 10*9/L — ABNORMAL HIGH (ref 2.0–7.5)
NEUTROPHILS RELATIVE PERCENT: 72.6 %
PLATELET COUNT: 281 10*9/L (ref 150–440)
RED BLOOD CELL COUNT: 4.51 10*12/L (ref 4.50–5.90)
RED CELL DISTRIBUTION WIDTH: 14.7 % (ref 12.0–15.0)
WBC ADJUSTED: 13.1 10*9/L — ABNORMAL HIGH (ref 4.5–11.0)

## 2019-02-08 LAB — COMPREHENSIVE METABOLIC PANEL
ALBUMIN: 4 g/dL (ref 3.5–5.0)
ALKALINE PHOSPHATASE: 84 U/L (ref 38–126)
ANION GAP: 11 mmol/L (ref 7–15)
AST (SGOT): 25 U/L (ref 19–55)
BILIRUBIN TOTAL: 1.3 mg/dL — ABNORMAL HIGH (ref 0.0–1.2)
BLOOD UREA NITROGEN: 24 mg/dL — ABNORMAL HIGH (ref 7–21)
BUN / CREAT RATIO: 23
CALCIUM: 9.2 mg/dL (ref 8.5–10.2)
CHLORIDE: 93 mmol/L — ABNORMAL LOW (ref 98–107)
CO2: 24 mmol/L (ref 22.0–30.0)
CREATININE: 1.05 mg/dL (ref 0.70–1.30)
EGFR CKD-EPI AA MALE: >90 mL/min/{1.73_m2} (ref >=60)
EGFR CKD-EPI NON-AA MALE: 85 mL/min/{1.73_m2} (ref >=60)
POTASSIUM: 5 mmol/L (ref 3.5–5.0)
PROTEIN TOTAL: 7.1 g/dL (ref 6.5–8.3)
SODIUM: 128 mmol/L — ABNORMAL LOW (ref 135–145)

## 2019-02-08 LAB — MONOCYTES ABSOLUTE COUNT: Monocytes:NCnc:Pt:Bld:Qn:Automated count: 0.8

## 2019-02-08 LAB — INR: Coagulation tissue factor induced.INR:RelTime:Pt:PPP:Qn:Coag: 1.07

## 2019-02-08 LAB — MAGNESIUM: Magnesium:MCnc:Pt:Ser/Plas:Qn:: 1.9

## 2019-02-08 LAB — C-REACTIVE PROTEIN: C reactive protein:MCnc:Pt:Ser/Plas:Qn:: 22.5 — ABNORMAL HIGH

## 2019-02-08 LAB — LACTATE BLOOD VENOUS: Lactate:SCnc:Pt:BldV:Qn:: 2.1 — ABNORMAL HIGH

## 2019-02-08 LAB — ALT (SGPT): Alanine aminotransferase:CCnc:Pt:Ser/Plas:Qn:: 13

## 2019-02-08 NOTE — Unmapped (Signed)
Care Management  Initial Transition Planning Assessment              General  Care Manager assessed the patient by : In person interview with patient, In person interview with family, Telephone conversation with patient, Medical record review, Discussion with Clinical Care team(Pt's wife at bedside)  Orientation Level: Oriented X4  Functional level prior to admission: Independent  Reason for referral: Discharge Planning  CM met with patient in pt room.  Pt/visitors were not wearing hospital provided masks for the duration of the interaction with CM.   CM was wearing hospital provided surgical mask and hospital provided eye protection.  CM was not within 6 foot of the patient/visitors during this interaction.     Contact/Decision Maker  Extended Emergency Contact Information  Primary Emergency Contact: Renfrow,Bernadette   [place]  Mobile Phone: [phone]  Relation: Spouse    Legal Next of Kin / Guardian / POA / Advance Directives       Advance Directive (Medical Treatment)  Does patient have an advance directive covering medical treatment?: Patient does not have advance directive covering medical treatment.(Pt states wife, Kosta Schnitzler, would be decision maker, legal next of kin')  Reason patient does not have an advance directive covering medical treatment:: Patient does not wish to complete one at this time.    Health Care Decision Maker [HCDM] (Medical & Mental Health Treatment)  Healthcare Decision Maker: Patient needs follow-up to appoint a Health Care Decision Maker.  Information offered on HCDM, Medical & Mental Health advance directives:: Patient declined information.    Advance Directive (Mental Health Treatment)  Does patient have an advance directive covering mental health treatment?: Patient does not have advance directive covering mental health treatment. Reason patient does not have an advance directive covering mental health treatment:: Patient does not wish to complete one at this time.    Patient Information  Lives with: Spouse/significant other    Type of Residence: Private residence   Type of Residence: Mailing Address:  [address]  [place] [address]  Contacts: Accompanied by: Alone  Password: [id number]  Patient Phone Number:   Telephone Information:   Mobile [phone]             Medical Provider(s): Suanne Marker, MD  Reason for Admission: Admitting Diagnosis:  Crohn's Flare  Past Medical History:   has a past medical history of Anxiety, Crohn's disease (CMS-HCC), GERD (gastroesophageal reflux disease), and Hypertension (10/08/2016).  Past Surgical History:   has a past surgical history that includes Colon surgery; pr upper gi endoscopy,biopsy (N/A, 07/23/2012); pr colonoscopy w/biopsy single/multiple (07/23/2012); pr colonoscopy flx dx w/collj spec when pfrmd (Left, 11/10/2012); pr upper gi endoscopy,diagnosis (N/A, 11/10/2012); pr upper gi endoscopy,diagnosis (N/A, 07/21/2013); pr colonoscopy flx dx w/collj spec when pfrmd (N/A, 07/21/2013); pr upper gi endoscopy,diagnosis (N/A, 07/15/2014); pr colsc flexible w/transendoscopic balloon dilat (N/A, 07/15/2014); pr colonoscopy w/biopsy single/multiple (N/A, 07/15/2014); pr colonoscopy flx dx w/collj spec when pfrmd (N/A, 08/09/2016); 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   Previous admit date: 12/20/2017    Primary Insurance- Payor: MEDICARE / Plan: MEDICARE PART A AND PART B / Product Type: *No Product type* /   Secondary Insurance ??? Secondary Insurance  MEDICAID [place]  Prescription Coverage ??? medicare/medicaid Preferred Pharmacy - [facility] [place] ([place]), [place] - [address]    Transportation home: Private vehicle     Location/Detail: [address] [place] [address]    Support Systems/Concerns: Spouse    Responsibilities/Dependents at home?: N/A    Home Care services in place prior to admission?: No       Equipment Currently Used at Home: none       Currently receiving outpatient dialysis?: No       Financial Information       Need for financial assistance?: No(Pt receives SSDI)       Social Determinants of Health  Food insecurity        Worry: Never true       Inability: Never true         Discharge Needs Assessment  Concerns to be Addressed: no discharge needs identified    Clinical Risk Factors:      Barriers to taking medications: No    Prior overnight hospital stay or ED visit in last 90 days: No    Readmission Within the Last 30 Days: no previous admission in last 30 days      Anticipated Changes Related to Illness: none    Equipment Needed After Discharge: none    Discharge Facility/Level of Care Needs: other (see comments)(home with self care)    Readmission  Risk of Unplanned Readmission Score: UNPLANNED READMISSION SCORE: 10%  Predictive Model Details           10% (Low) Factors Contributing to Score   Calculated 02/08/2019 14:43 22% Number of active Rx orders is 18   [place] Risk of Unplanned Readmission Model 17% Active antipsychotic Rx order is present     16% ECG/EKG order is present in last 6 months     14% Latest BUN is high (24 mg/dL)     91% Imaging order is present in last 6 months     8% Active corticosteroid Rx order is present     6% Age is 48     3% Future appointment is scheduled     2% Active ulcer medication Rx order is present     1% Current length of stay is 0.534 days     Readmitted Within the Last 30 Days? (No if blank)   Patient at risk for readmission?: No    Discharge Plan Screen findings are: Care Manager reviewed the plan of the patient's care with the Multidisciplinary Team. No discharge planning needs identified at this time. Care Manager will continue to manage plan and monitor patient's progress with the team.      Expected Transfer from Critical Care:  TBD       Patient and/or family were provided with choice of facilities / services that are available and appropriate to meet post hospital care needs?: N/A       Initial Assessment complete?: Yes

## 2019-02-08 NOTE — Unmapped (Signed)
Pt free from falls. VSS. Afebrile. C/o pain and nausea. Pt reports no BM since 1/7. WCTM    Problem: Adult Inpatient Plan of Care  Goal: Plan of Care Review  Outcome: Ongoing - Unchanged  Goal: Patient-Specific Goal (Individualization)  Outcome: Ongoing - Unchanged  Goal: Absence of Hospital-Acquired Illness or Injury  Outcome: Ongoing - Unchanged  Goal: Optimal Comfort and Wellbeing  Outcome: Ongoing - Unchanged  Goal: Readiness for Transition of Care  Outcome: Ongoing - Unchanged  Goal: Rounds/Family Conference  Outcome: Ongoing - Unchanged     Problem: Wound  Goal: Optimal Wound Healing  Outcome: Ongoing - Unchanged     Problem: Infection  Goal: Infection Symptom Resolution  Outcome: Ongoing - Unchanged

## 2019-02-08 NOTE — Unmapped (Signed)
Medicine Access Physician (MAP): Transfer Center Request Note    Requesting Physician: Dr Alden Server    [facility]: [facility] in [place], [place]    Requesting Service: ED    Reason for Transfer: GI Crohns    Brief Hospital Course:   49yo male with Crohn's disease followed here at [facility]. Visiting in [place] with development of 3d abd pain, diarrhea, and new syncope. Hypotensive on presentation 86/42, improved with IVF (1L) to 100/60. Not tachy, not tachypnic. CT with evidence of Crohns.  COVID pending.  Pt in touch with his [facility] GI physician Dr Elizebeth Brooking and they recommended to have the pt transferred to [facility] for ongoing care.  Dr Alden Server relates they do have GI at [facility] and this is a transfer at rest of pt and his subspecialist physician here at [facility].     See [facility] in Care Everywhere as this is the chart for [facility] [place], [place]).      Discussed with [facility]O. We are currently at capacity for OSH transfers for COVID patients but can accept this pt if they do not have COVID.  Have relayed this to the [facility] who is communicating that to the [facility] physician.     COVID19 Concerns: No, ruled out.  [place] DHSS# -or- Lab Carrying Out COVID testing if applicable: OSH Lab    Plan Upon Arrival: GI Consult    Bed Type: Floor  Are there any clinical exclusion criteria as of the current date, as defined in the [facility], to providing the patient's clinical care at [facility]? If yes, please indicate the exclusion criteria: YES, GI Luminal      Accepting Service: MDU or Appropriate for medicine services as determined by MAO for regionalization.    Imaging Needed:  Yes.    [employer]: Asked [employer] asked that they request a disc.   (What is this?) Please check under Care Everywhere and/or External Records in the patient's Media Tab to see if a discharge summary has been previously sent electronically.  Additional helpful tools: [facility]    Patient accepted from an emergency departement and therefore back transfer was not discussed

## 2019-02-08 NOTE — Unmapped (Addendum)
Incorrectly received page from transfer center regarding this patient x 2. Spoke with physician who called and told them they need to call [facility] [phone].    This was one of several incorrect pages to GI luminal fellow over the weekend. Submitted safe report of incorrect page, which resulted in delay of care for patient.

## 2019-02-08 NOTE — Unmapped (Signed)
PHYSICAL THERAPY  Evaluation (02/08/19 1450)     Patient Name:  Rodney Bender [facility]       Medical Record Number: [id number]   Date of Birth: August 01, 1970  Sex: Male            Treatment Diagnosis: Abdominal pain, syncopal episode prior to admission    ASSESSMENT  Problem List: Decreased endurance, Pain     Assessment : Patient is a 49 y.o. male with PMH ileocolonic Crohn's disease (diagnosed in 41) status post ileocecal resection x4, anxiety being transferred from [facility] to [facility] for evaluation by GI for development of 3 days of abdominal pain, diarrhea, and new syncope (while passenger in a car). Patient presents today with deficits in endurance and will benefit from skilled acute PT intervention to address these impairments and to monitor progression of mobility while pt is in house. Patient is able to perform sit to/from stand transfers independently, however requires stand by assist to ambulate with no device but declines additional ambulation due to c/o fatigue at this time. Based on the AM-PAC six item raw score of 22/24, the patient is considered to be 25.02% impaired with basic mobility. This indicates that no PT f/u is currently recommended at this time. After a review of the personal factors, comorbidities, clinical presentation, and examination of the number of affected body systems, the patient presents as a moderate complexity case.     Today's Interventions: Patient educated on role of PT, PT POC, PT goals, importance of having increased time OOB throughout day, self-monitoring signs/symptoms with positional changes.                          PLAN  Planned Frequency of Treatment:  1x per day for: 1-2x week Planned Interventions: Balance activities, Education - Patient, Education - Family / caregiver, Endurance activities, Functional mobility, Investment banker, operational, Home exercise program, Neuromuscular re-education, Postural re-education, Self-care / Home training, Stair training, Therapeutic exercise, Therapeutic activity, Transfer training    Post-Discharge Physical Therapy Recommendations:  PT services not indicated    PT DME Recommendations: None           Goals:   Patient and Family Goals: To decrease pain and to return home    Long Term Goal #1: In 4 weeks, patient will ambulate >800 ft independently with no device in order to increase independence with community ambulation       SHORT GOAL #1: Patient will perform all functional tranfsers independently with no device              Time Frame : 1 week  SHORT GOAL #2: Patient will ambulate 150 ft independently with no device              Time Frame : 1 week                                                          Prognosis:  Good  Positive Indicators: Age, PLOF, caregiver support  Barriers to Discharge: Pain, Endurance deficits    SUBJECTIVE  Patient reports: Patient and RN agreeable to PT, I just want to sleep  Current Functional Status: Patient received supine in bed, ended session sitting EOB, lines intact, needs within reach, RN aware.  Services patient receives: PT(pt was  receiving outpatient PT)  Prior Functional Status: Prior to admission, patient reports ambulating independently with no device, denies any recent falls. Patient reports he had been receiving outpatient PT to address right shoulder ROM and strength deficits. Patient reports he and his wife share household tasks.  Equipment available at home: None     Past Medical History:   Diagnosis Date   ??? Anxiety    ??? Crohn's disease (CMS-HCC)     diagnosed in 1990   ??? GERD (gastroesophageal reflux disease)    ??? Hypertension 10/08/2016    Social History     Tobacco Use   ??? Smoking status: Former Smoker Packs/day: 1.00     Years: 18.00     Pack years: 18.00     Types: Cigarettes     Start date: 08/27/2003     Quit date: 06/06/2017     Years since quitting: 1.6   ??? Smokeless tobacco: Never Used   ??? Tobacco comment: Pt smokes 1ppd, Pt is interested in tobacco cessation    Substance Use Topics   ??? Alcohol use: No      Past Surgical History:   Procedure Laterality Date   ??? COLON SURGERY     ??? PR COLONOSCOPY FLX DX W/COLLJ SPEC WHEN PFRMD Left 11/10/2012    Procedure: COLONOSCOPY, FLEXIBLE, PROXIMAL TO SPLENIC FLEXURE; DIAGNOSTIC, W/WO COLLECTION SPECIMEN BY BRUSH OR WASH;  Surgeon: Malcolm Metro, MD;  [facility];  Service: Gastroenterology   ??? PR COLONOSCOPY FLX DX W/COLLJ SPEC WHEN PFRMD N/A 07/21/2013    Procedure: COLONOSCOPY, FLEXIBLE, PROXIMAL TO SPLENIC FLEXURE; DIAGNOSTIC, W/WO COLLECTION SPECIMEN BY BRUSH OR WASH;  Surgeon: Gwen Pounds, MD;  Location: [facility];  Service: Gastroenterology   ??? PR COLONOSCOPY FLX DX W/COLLJ SPEC WHEN PFRMD N/A 08/09/2016    Procedure: COLONOSCOPY, FLEXIBLE, PROXIMAL TO SPLENIC FLEXURE; DIAGNOSTIC, W/WO COLLECTION SPECIMEN BY BRUSH OR WASH;  Surgeon: Janyth Pupa, MD;  Location: [facility];  Service: Gastroenterology   ??? PR COLONOSCOPY FLX DX W/COLLJ SPEC WHEN PFRMD N/A 04/06/2018    Procedure: COLONOSCOPY, FLEXIBLE, PROXIMAL TO SPLENIC FLEXURE; DIAGNOSTIC, W/WO COLLECTION SPECIMEN BY BRUSH OR WASH;  Surgeon: Zetta Bills, MD;  Location: [facility];  Service: Gastroenterology   ??? PR COLONOSCOPY W/BIOPSY SINGLE/MULTIPLE  07/23/2012    Procedure: COLONOSCOPY, FLEXIBLE, PROXIMAL TO SPLENIC FLEXURE; WITH BIOPSY, SINGLE OR MULTIPLE;  Surgeon: Vickii Chafe, MD;  Location: [facility];  Service: Gastroenterology   ??? PR COLONOSCOPY W/BIOPSY SINGLE/MULTIPLE N/A 07/15/2014 Procedure: COLONOSCOPY, FLEXIBLE, PROXIMAL TO SPLENIC FLEXURE; WITH BIOPSY, SINGLE OR MULTIPLE;  Surgeon: Janyth Pupa, MD;  [facility]: [facility];  Service: Gastroenterology   ??? PR COLONOSCOPY W/BIOPSY SINGLE/MULTIPLE N/A 03/21/2017    Procedure: COLONOSCOPY, FLEXIBLE, PROXIMAL TO SPLENIC FLEXURE; WITH BIOPSY, SINGLE OR MULTIPLE;  Surgeon: Modena Nunnery, MD;  [facility]: [facility];  Service: Gastroenterology   ??? PR COLSC FLEXIBLE W/TRANSENDOSCOPIC BALLOON DILAT N/A 07/15/2014    Procedure: COLONOSCOPY, FLEXIBLE; WITH DILATION BY BALLOON, 1 OR MORE STRICTURES;  Surgeon: Janyth Pupa, MD;  Location: [facility];  Service: Gastroenterology   ??? PR REMVL COLON & TERM ILEUM W/ILEOCOLOSTOMY N/A 09/16/2016    Procedure: COLECTOMY, PARTIAL, WITH REMOVAL OF TERMINAL ILEUM WITH ILEOCOLOSTOMY;  Surgeon: Lady Gary, MD;  Location: MAIN OR [place];  Service: Gastrointestinal   ??? PR SHLDR ARTHROSCOP,PART ACROMIOPLAS Right 10/02/2018    Procedure: R16 ARTHROSCOPY, SHOULDER,  SURGICAL; DECOMPRESS SUBACROMIAL SPACE W/PART ACROMIOPLASTY, Tamala Bari;  Surgeon: Gonzella Lex, MD;  Location: ASC OR [facility];  Service: Orthopedics   ??? PR SHLDR ARTHROSCOP,SURG,W/ROTAT CUFF REPR Right 10/02/2018    Procedure: ARTHROSCOPY, SHOULDER, SURGICAL; WITH ROTATOR CUFF REPAIR;  Surgeon: Gonzella Lex, MD;  Location: ASC OR [facility];  Service: Orthopedics   ??? PR UPPER GI ENDOSCOPY,BIOPSY N/A 07/23/2012    Procedure: UGI ENDOSCOPY; WITH BIOPSY, SINGLE OR MULTIPLE;  Surgeon: Vickii Chafe, MD;  [facility];  Service: Gastroenterology   ??? PR UPPER GI ENDOSCOPY,DIAGNOSIS N/A 11/10/2012    Procedure: UGI ENDO, INCLUDE ESOPHAGUS, STOMACH, & DUODENUM &/OR JEJUNUM; DX W/WO COLLECTION SPECIMN, BY BRUSH OR WASH;  Surgeon: Malcolm Metro, MD;  Location: [facility];  Service: Gastroenterology ??? PR UPPER GI ENDOSCOPY,DIAGNOSIS N/A 07/21/2013    Procedure: UGI ENDO, INCLUDE ESOPHAGUS, STOMACH, & DUODENUM &/OR JEJUNUM; DX W/WO COLLECTION SPECIMN, BY BRUSH OR WASH;  Surgeon: Gwen Pounds, MD;  Location: [facility];  Service: Gastroenterology   ??? PR UPPER GI ENDOSCOPY,DIAGNOSIS N/A 07/15/2014    Procedure: UGI ENDO, INCLUDE ESOPHAGUS, STOMACH, & DUODENUM &/OR JEJUNUM; DX W/WO COLLECTION SPECIMN, BY BRUSH OR WASH;  Surgeon: Janyth Pupa, MD;  Location: [facility];  Service: Gastroenterology   ??? SPINE SURGERY      Family History   Problem Relation Age of Onset   ??? Hyperlipidemia Father    ??? Cancer Father    ??? Cancer Maternal Aunt    ??? Stroke Mother    ??? No Known Problems Sister    ??? No Known Problems Brother    ??? No Known Problems Maternal Uncle    ??? No Known Problems Paternal Aunt    ??? No Known Problems Paternal Uncle    ??? No Known Problems Maternal Grandmother    ??? No Known Problems Maternal Grandfather    ??? No Known Problems Paternal Grandmother    ??? No Known Problems Paternal Grandfather    ??? Anesthesia problems Neg Hx    ??? Broken bones Neg Hx    ??? Clotting disorder Neg Hx    ??? Collagen disease Neg Hx    ??? Diabetes Neg Hx    ??? Dislocations Neg Hx    ??? Fibromyalgia Neg Hx    ??? Gout Neg Hx    ??? Hemophilia Neg Hx    ??? Osteoporosis Neg Hx    ??? Rheumatologic disease Neg Hx    ??? Scoliosis Neg Hx    ??? Severe sprains Neg Hx    ??? Sickle cell anemia Neg Hx    ??? Spinal Compression Fracture Neg Hx         Allergies: Hydrocodone-acetaminophen, Tramadol, Dilaudid [hydromorphone], Morphine, and Opioids - morphine analogues                Objective Findings  Precautions / Restrictions  Precautions: Isolation precautions(enteric precautions)  Weight Bearing Status: Non-applicable  Required Braces or Orthoses: Non-applicable    Communication Preference: Verbal   Pain Comments: Patient reports 8/10 to abdomen, also reports ongoing nausea, RN made aware Medical Tests / Procedures: Labs, orders, and imaging reviewed in Epic.  Equipment / Environment: Vascular access (PIV, TLC, Port-a-cath, PICC), Telemetry, Patient wearing mask for full session    At Rest: VSS per Epic     Orthostatics: Supine BP: 129/67, HR 60 bpm; Seated BP: 156/99, HR 61 bpm; Standing BP: 139/94, HR 77 bpm, pt  asymptomatic       Living Situation  Living Environment: House  Lives With: Spouse  Home Living: One level home, Stairs to enter without rails  Number of Stairs: 2     Cognition: Patient follows commands and answers questions appropriately  Visual / Perception Status: Patient reports he needs new glasses, reports wearing glasses all the time however not donned during session  Skin Inspection: Abdominal distention    UE ROM: WFL  UE Strength: WFL  LE ROM: WFL  LE Strength: WFL                       Sensation: Patient reports occasional numbness/tingling to right UE  Balance: Patient sits unsupported EOB independently; patient stands with no device with good standing balance during dynamic tasks         Bed Mobility: Patient transitions supine to sit independently from flat hospital bed  Transfers: Patient transfers sit to/from stand independently with no device   Gait  Level of Assistance: Standby assist, set-up cues, supervision of patient - no hands on  Assistive Device: None  Distance Ambulated (ft): 3 ft  Gait: Patient ambulates 3 ft with no device, SBA provided for safety due to pt's impulsitivity however pt declines additional ambulation at this time due to desire to rest  Stairs: Not assessed      Endurance: Impaired, patient reports generalized fatigue due to lack of sleep    Physical Therapy Session Duration  PT Individual - Duration: 12    Medical Staff Made Aware: RN Ellyn Hack aware of session outcome    I attest that I have reviewed the above information.  Signed: Fernanda Drum, PT  Filed 02/08/2019

## 2019-02-08 NOTE — Unmapped (Signed)
LUMINAL GASTROENTEROLOGY INPATIENT CONSULTATION H&P      Requesting Attending Physician:  Mauri [place], MD  Requesting Consult Service: MDU     Reason for Consult:    Rodney Bender is a 48 y.o. male seen in consultation at the request of Dr. Mauri [place], MD for 3 days of abdominal pain concerning for infection vs. Crohn's flare.     ASSESSMENT / PLAN     49 y.o. male with PMHx of stricturing ileocolonic Crohn's (dx 1990), s/p ileocecal resection (x4, last in 2018) currently on Stelara, who presents with abdominal pain and new onset syncope concerning for infection vs. Crohn's flare, though sx could also be c/w an obstructive (or ileus-like) picture given the pt reports obstipation.    Patient has been having GI symptoms for 3 days, which would be concerning for stricture given his IBD history. After speaking with radiology, they do not believe there is any narrowing or obstruction present and report only mild stool burden. Therefore, unlikely constipation or stricture are causing his symptoms. Agree with primary team in obtaining GIPP and C. Diff. Although will be difficult to obtain since patient has not had a bowel movement since 1/7. Would hold off on starting any steroids until infection is ruled out for now. Most recent colonoscopy unremarkable from 03/2018 without inflammation and Rutgeerts score i0 (no lesions in distal ileum) but no biopsies collected. Patient could be having a crohn's flare but we would recommend first ruling out any infection if possible. Repeat CRP elevated at 22.5. We anticipate he will have difficulty with bowel prep with his nausea and abdominal pain. Would obtain a KUB given the more recent history we obtained about obstipation during rounds. Will continue to closely monitor patient and symptoms.     RECOMMENDATIONS:  - Order KUB for rule out obstruction or ileus   - Pending C. Diff and GI pathogen panel collections - No new medications or procedures as of yet.    Thank you for this consult. The patient was Discussed with and seen by Dr. Jessee Avers. We will continue to follow along. Please page luminal fellow on call with questions.     Shawna Orleans, MD   [facility]        I saw and evaluated the patient, participating in the key portions of the service.?? I reviewed the resident???s note.?? I agree with the resident???s findings and plan. Epifania Gore, MD            SUBJECTIVE:     Chief Complaint/Reason for Consult: Abdominal pain and diarrhea x 3 days     History of Present Illness:   This is a 49 y.o. male with PMHx of stricturing ileocolonic Crohn's (dx 1990), s/p ileocecal resection (x4, last in 2018) currently on Stelara, who presents with abdominal pain, diarrhea and new onset syncope. Follows with Dr. Elizebeth Brooking.    Patient has been having three days of abdominal pain and syncopal episodes. Happened most recently while she was a passenger in a car and presented to [place] ED. He was hypotensive on presentation 86/42, which improved after 1 L of IVFs. He was not tachycardic or tachypneic at this time. CT at the OSH demonstrated questionable mild wall thickening of small bowel loops in the lower abdomen/pelvis (concerning for enteritis), no evidence of bowel obstruction, small amount of abdominopelvic ascites with mild mesenteric edema; wedge-shaped area of diminished enhancement in the lower pole of the left kidney, correlation with urinalysis recommended to  exclude pyelonephritis. Since he had bowel wall thickening and leukocytosis of 20,000, primary team concerned for Crohn's flare. Patient advised by Dr. Elizebeth Brooking to come to [facility] for further care. At [facility], patient in significant abdominal pain unable to provide history. He reports 10/10 constant abdominal cramping pain, has not had a bowel movement in two days. He reports some blood streaked stools in his bowel movement. He has not been on any antibiotics. He did have C. Difficile couple years ago. Has been unable to sleep for the past couple of nights. He denies fevers, chills, lightheadedness or dizziness, changes in urination. No changes in diet. He does report decreased PO intake. No sick contacts. ESR 5 (normal) but CRP 12.7 (elevated).    Crohn's Disease History:     1. Stricturing ileocolonic Crohn's disease diagnosed 1990  2. Status post ileocecal resection 1998 due to stricture, with segmental ileocolectomy 2009 due to severe recurrent stricturing Crohn's disease.  3. On Remicade for a short time in the past, however discontinued due to noncompliance and transportation problems.  4. Previous he followed by Dr. Gwenith Spitz here at [facility] but terminated due to repeated no-shows and medical noncompliance  5. Previously on 6-MP, however this medication was discontinued after patient had undetectable drug levels on a recent admission  6. Recalls being on Humira at one point, but unclear exactly when.  7. He had additional ileocolonic resection by [facility] 09/16/2016, doing clinically much better for about six months after that, started on Stelara getting injections on site to insure adherence.  8. Symptoms improved and gained weight after surgery, did very well for several months, then developed nausea and vomiting and recurrent chronic abdominal pain. Imaging and labs were reassuring against flare and colonoscopy was without active disease.    Review of Systems:  Patient with nausea.     MEDICAL HISTORY:     Past Medical History:  Past Medical History:   Diagnosis Date   ??? Anxiety    ??? Crohn's disease (CMS-HCC)     diagnosed in 1990   ??? GERD (gastroesophageal reflux disease)    ??? Hypertension 10/08/2016 Surgical History:  Past Surgical History:   Procedure Laterality Date   ??? COLON SURGERY     ??? PR COLONOSCOPY FLX DX W/COLLJ SPEC WHEN PFRMD Left 11/10/2012    Procedure: COLONOSCOPY, FLEXIBLE, PROXIMAL TO SPLENIC FLEXURE; DIAGNOSTIC, W/WO COLLECTION SPECIMEN BY BRUSH OR WASH;  Surgeon: Malcolm Metro, MD;  [facility];  Service: Gastroenterology   ??? PR COLONOSCOPY FLX DX W/COLLJ SPEC WHEN PFRMD N/A 07/21/2013    Procedure: COLONOSCOPY, FLEXIBLE, PROXIMAL TO SPLENIC FLEXURE; DIAGNOSTIC, W/WO COLLECTION SPECIMEN BY BRUSH OR WASH;  Surgeon: Gwen Pounds, MD;  Location: [facility]URES [facility];  Service: Gastroenterology   ??? PR COLONOSCOPY FLX DX W/COLLJ SPEC WHEN PFRMD N/A 08/09/2016    Procedure: COLONOSCOPY, FLEXIBLE, PROXIMAL TO SPLENIC FLEXURE; DIAGNOSTIC, W/WO COLLECTION SPECIMEN BY BRUSH OR WASH;  Surgeon: Janyth Pupa, MD;  Location: [facility];  Service: Gastroenterology   ??? PR COLONOSCOPY FLX DX W/COLLJ SPEC WHEN PFRMD N/A 04/06/2018    Procedure: COLONOSCOPY, FLEXIBLE, PROXIMAL TO SPLENIC FLEXURE; DIAGNOSTIC, W/WO COLLECTION SPECIMEN BY BRUSH OR WASH;  Surgeon: Zetta Bills, MD;  Location: [facility] PROCEDURES [place] [facility];  Service: Gastroenterology   ??? PR COLONOSCOPY W/BIOPSY SINGLE/MULTIPLE  07/23/2012    Procedure: COLONOSCOPY, FLEXIBLE, PROXIMAL TO SPLENIC FLEXURE; WITH BIOPSY, SINGLE OR MULTIPLE;  Surgeon: Vickii Chafe, MD;  Location: [facility];  Service: Gastroenterology   ??? PR COLONOSCOPY W/BIOPSY SINGLE/MULTIPLE N/A 07/15/2014    Procedure: COLONOSCOPY, FLEXIBLE, PROXIMAL TO SPLENIC FLEXURE; WITH BIOPSY, SINGLE OR MULTIPLE;  Surgeon: Janyth Pupa, MD;  Location: GI [facility]URES [facility];  Service: Gastroenterology   ??? PR COLONOSCOPY W/BIOPSY SINGLE/MULTIPLE N/A 03/21/2017 Procedure: COLONOSCOPY, FLEXIBLE, PROXIMAL TO SPLENIC FLEXURE; WITH BIOPSY, SINGLE OR MULTIPLE;  Surgeon: Modena Nunnery, MD;  Location: [facility] [place] [facility];  Service: Gastroenterology   ??? PR COLSC FLEXIBLE W/TRANSENDOSCOPIC BALLOON DILAT N/A 07/15/2014    Procedure: COLONOSCOPY, FLEXIBLE; WITH DILATION BY BALLOON, 1 OR MORE STRICTURES;  Surgeon: Janyth Pupa, MD;  Location: GI [facility]URES [facility];  Service: Gastroenterology   ??? PR REMVL COLON & TERM ILEUM W/ILEOCOLOSTOMY N/A 09/16/2016    Procedure: COLECTOMY, PARTIAL, WITH REMOVAL OF TERMINAL ILEUM WITH ILEOCOLOSTOMY;  Surgeon: Lady Gary, MD;  Location: MAIN OR [place];  Service: Gastrointestinal   ??? PR SHLDR ARTHROSCOP,PART ACROMIOPLAS Right 10/02/2018    Procedure: R16 ARTHROSCOPY, SHOULDER, SURGICAL; DECOMPRESS SUBACROMIAL SPACE W/PART ACROMIOPLASTY, Tamala Bari;  Surgeon: Gonzella Lex, MD;  Location: ASC OR [facility];  Service: Orthopedics   ??? PR SHLDR ARTHROSCOP,SURG,W/ROTAT CUFF REPR Right 10/02/2018    Procedure: ARTHROSCOPY, SHOULDER, SURGICAL; WITH ROTATOR CUFF REPAIR;  Surgeon: Gonzella Lex, MD;  Location: ASC OR [facility];  Service: Orthopedics   ??? PR UPPER GI ENDOSCOPY,BIOPSY N/A 07/23/2012    Procedure: UGI ENDOSCOPY; WITH BIOPSY, SINGLE OR MULTIPLE;  Surgeon: Vickii Chafe, MD;  Location: GI [facility]URES [facility];  Service: Gastroenterology   ??? PR UPPER GI ENDOSCOPY,DIAGNOSIS N/A 11/10/2012    Procedure: UGI ENDO, INCLUDE ESOPHAGUS, STOMACH, & DUODENUM &/OR JEJUNUM; DX W/WO COLLECTION SPECIMN, BY BRUSH OR WASH;  Surgeon: Malcolm Metro, MD;  Location: [facility]URES [facility];  Service: Gastroenterology   ??? PR UPPER GI ENDOSCOPY,DIAGNOSIS N/A 07/21/2013    Procedure: UGI ENDO, INCLUDE ESOPHAGUS, STOMACH, & DUODENUM &/OR JEJUNUM; DX W/WO COLLECTION SPECIMN, BY BRUSH OR WASH;  Surgeon: Gwen Pounds, MD;  Location: [facility]URES [facility];  Service: Gastroenterology ??? PR UPPER GI ENDOSCOPY,DIAGNOSIS N/A 07/15/2014    Procedure: UGI ENDO, INCLUDE ESOPHAGUS, STOMACH, & DUODENUM &/OR JEJUNUM; DX W/WO COLLECTION SPECIMN, BY BRUSH OR WASH;  Surgeon: Janyth Pupa, MD;  Location: [facility];  Service: Gastroenterology   ??? SPINE SURGERY         Family History:  - Father and maternal aunt with cancers, unclear which type.    Medications:   Current Facility-Administered Medications   Medication Dose Route Frequency Provider Last Rate Last Admin   ??? acetaminophen (TYLENOL) solution 650 mg  650 mg Oral Q4H PRN De-Vaughn Sima Matas, MD       ??? dicyclomine (BENTYL) capsule 10 mg  10 mg Oral 4x Daily PRN De-Vaughn Sima Matas, MD       ??? melatonin tablet 3 mg  3 mg Oral Nightly PRN De-Vaughn Sima Matas, MD       ??? MORPhine injection 2 mg  2 mg Intravenous Q6H PRN De-Vaughn Sima Matas, MD   2 mg at 02/08/19 0646   ??? oxyCODONE (ROXICODONE) immediate release tablet 10 mg  10 mg Oral Q6H PRN De-Vaughn Sima Matas, MD   10 mg at 02/08/19 0256       Allergies:   Hydrocodone-acetaminophen, Tramadol, Dilaudid [hydromorphone], Morphine, and Opioids - morphine analogues    Social History:  - Former smoker. Would spoke 1 ppd for 18 years. Quit 06/06/2017. Denies any alcohol  use. Used to use marijuana.     Objective:      Vital Signs/Weight:  Temp:  [36.7 ??C] 36.7 ??C  Heart Rate:  [56-66] 57  Resp:  [16] 16  BP: (124-132)/(70-78) 124/70  MAP (mmHg):  [97] 97  SpO2:  [97 %-100 %] 97 %  Wt Readings from Last 3 Encounters:   10/02/18 61.1 kg (134 lb 11.2 oz)   04/06/18 59.4 kg (131 lb)   03/27/18 59.6 kg (131 lb 4.8 oz)     Physical Exam:  Constitutional: Chronically ill-appearing, thin appearing   HEENT: anicteric sclerae  CV: regular  Lung: normal WOB  Abdomen: soft, non-distended, +diffusely tender to palpation, +guarding  Extremities: No edema  Skin: No jaundice  Neuro: Alert, Oriented x 3, No focal deficits. Mental Status: Thought organized, appropriate affect, pleasantly interactive, not anxious appearing      DIAGNOSTIC STUDIES     I reviewed all pertinent diagnostic studies, including:      Labs:    Recent Labs     02/08/19  0617   WBC 13.1*   HGB 14.0   HCT 43.3   PLT 281     Recent Labs     02/08/19  0617   NA 128*   K 5.0   CL 93*   BUN 24*   CREATININE 1.05   GLU 59*     Recent Labs     02/08/19  0617   PROT 7.1   ALBUMIN 4.0   AST 25   ALT 13   ALKPHOS 84   BILITOT 1.3*     Recent Labs     02/08/19  0617   INR 1.07     No results for input(s): CRP in the last 72 hours.  No results for input(s): IRON, TIBC, FERRITIN in the last 72 hours.    Imaging:   CT A/P from OSH:  IMPRESSION:  ??  --Diffuse small bowel wall thickening is seen with surrounding free fluid consistent with enteritis.  ??  --Tiny wedge-shaped hypoenhancing area in the lower pole of the left kidney with no perinephric inflammatory stranding, nonspecific but suggestive of focal scarring.  Focal pyelonephritis and renal infarct are felt less likely given tiny nature of findings.    Abdominal US:  N/A    Microbiology:  N/A    GI Procedures:   Colonoscopy 04/06/2018:   Findings:       The perianal and digital rectal examinations were normal. Pertinent        negatives include no palpable rectal lesions.       A moderate amount of semi-solid stool was found in the entire colon,        interfering with visualization.       There was evidence of a prior end-to-side ileo-colonic anastomosis at        the ileocecal valve. This was patent and was characterized by healthy        appearing mucosa. The anastomosis was traversed.       The neo-terminal ileum was evaluated for 10+ cm and appeared normal.       Inflammation was not found based on the endoscopic appearance of the        mucosa. This was graded as Rutgeerts Score i0 (no lesions in the distal        ileum), and when compared to previous examinations, the findings are in        remission. Impression:          -  Preparation of the colon was poor.                       - Stool in the entire examined colon.                       - Patent end-to-side ileo-colonic anastomosis,                        characterized by healthy appearing mucosa.                       - The examined portion of the neo-terminal ileum was                        normal (Rutgeert's i0). .                       - No specimens collected.    EGD 07/15/2014:  Findings:       The examined esophagus was normal.       The entire examined stomach was normal.       The examined duodenum was normal.                                                                                   Impression:        - Normal esophagus.                     - Normal stomach.                     - Normal examined duodenum.                     - No specimens collected.

## 2019-02-08 NOTE — Unmapped (Signed)
Internal Medicine (MDU) History & Physical    Assessment & Plan:     Rodney Bender is a 49 y.o. male with PMH ileocolonic Crohn's disease (diagnosed in 39) status post ileocecal resection x4, anxiety being transferred from [facility] to [facility] for evaluation by GI for development of 3 days of abdominal pain, diarrhea, and new syncope.     Principal Problem:    Abdominal pain  Active Problems:    Intestinal bypass or anastomosis status    Crohn's disease of both small and large intestine with other complication (CMS-HCC)    Chronic abdominal pain  Resolved Problems:    * No resolved hospital problems. *    Abdominal pain, diarrhea, syncope - concern for Crohn's flare: Differential diagnosis Crohn's flare versus gastroenteritis versus small bowel obstruction versus peritonitis.  Afebrile, vitals within normal limits.  CBC notable for white count 20.5.  Chemistries notable for sodium 129, BUN 23, creatinine 1.66, T bili 1.5.  Negative lipase.  CRP elevated to 12.7.  ESR within normal limits at 5.  Normal lactate.  CT demonstrated questionable mild wall thickening of small bowel loops in the lower abdomen/pelvis, recent suspicion for enteritis, no evidence of bowel obstruction, small amount of abdominopelvic ascites with mild mesenteric edema.  Highest on my differential is enteritis as patient reports his Crohn's flares are usually defined by increased loose stools and bloody diarrhea he denies bowel movements for the past 2 days. Patient reports he is only on Stelara and although he is unable to state exactly when his next dose is due, he reports should be in the next 2 to 3 weeks as he receives injections every 8 weeks.  With respect to syncopal episode, I question if this is due to vagal stimulus from abdominal pain.  He denies cardiac history or arrhythmias.  Denies chest pain.  Patient was also notably hypotensive on admission to outside hospital, so question if component of orthostasis.  Less concern for seizure as patient has no prior history of seizures.  No focal deficits on exam.  Feel comfortable holding off on CT head at this time however low threshold to image if patient has another episode.  ???Consult GI in the morning  ???Telemetry  ???TTE for syncope work-up  ???ESR/CRP  ???Fecal calprotectin  ???C. difficile and GI pathogen panel  ???Clear liquids  ???Bentyl 4 times daily prn for cramping, tylenol prn,  Oxycodone prn, morphine for breakthrough pain   ???If C. difficile and GI pathogen panel come back negative anticipate steroids if this is in fact consistent with Crohn's flare     Ileocolonic Crohn's disease: Followed by Dr. Gildardo Griffes of [facility].  Diagnosed in 1990.  Status post 4 ileocolonic resection.  Colonoscopy 04/06/2018 limited due to poor preparation with stool in the entire examined colon, however patient end-to-side ileocolonic anastomosis, characterized by healthy-appearing mucosa and neoterminal ileum was normal.  ???GI consult in the morning as above  ???Stelara every 8 weeks as an outpatient    Daily Checklist:  Diet: Clear Liquid Diet  DVT PPx: SCDs   GI PPx: Not Indicated  Code Status: Full Code  Dispo: Admit to Med U    Chief Concern:   Abdominal pain    Subjective:   HPI:  Rodney Bender is a 49 y.o. male with PMH ileocolonic Crohn's disease (diagnosed in 44) status post ileocecal resection x4, anxiety being transferred from [facility] to [facility] for evaluation by GI for development of 3 days of abdominal  pain, diarrhea, and new syncope.    Patient endorses 3 days of abdominal pain and diarrhea and syncopal episode while passenger in a car.  Presented to [facility] ED.  Hypotensive on presentation 86/42, that improved with 1 L IV fluids to 100/60.  Not tachycardic, or tachypneic.  CT demonstrated questionable mild wall thickening of small bowel loops in the lower abdomen/pelvis, recent suspicion for enteritis, no evidence of bowel obstruction, small amount of abdominopelvic ascites with mild mesenteric edema; wedge-shaped area of diminished enhancement in the lower pole of the left kidney, correlation with urinalysis recommended to exclude pyelonephritis.  Given his bowel wall thickening and leukocytosis to 20,000, presentation was concerning for possible Crohn's flare.  Patient was in touch with his [facility] GI physician Dr. Elizebeth Brooking and they recommended to have the patient transferred to [facility] for ongoing care.     On arrival to [facility] patient was unable to provide much more to history due to pain.  He denies prodromal symptoms prior to syncopal episode.  Reports waking up and initially being confused for a couple minutes.  He is unsure of how long he passed out, but reports it was in the passenger seat of a car.  Denies prior episode of syncope.  With regards to Crohn's symptoms, he endorses constant 10/10 cramping abdominal pain, has not had a bowel movement in approximately 2 days, reports some blood-streaked stools with his last bowel movement.  Was not recently on antibiotics.  He does report history of C. difficile couple years ago.  Says he does not wake up in the middle of the night with diarrhea as he does not sleep at night, as he has been unable to sleep for days.  He denies fevers, chills, lightheadedness or dizziness, changes in urination.  Denies any changes in diet, however, however has had decreased p.o. intake.  Denies eating new restaurants.  Denies similar symptoms in family members or friends.      Crohn's disease history:  1. Stricturing ileocolonic Crohn's disease diagnosed 1990  2. Status post ileocecal resection 1998 due to stricture, with segmental ileocolectomy 2009 due to severe recurrent stricturing Crohn's disease.  3. On Remicade for a short time in the past, however discontinued due to noncompliance and transportation problems.  4. Previous he followed by Dr. Gwenith Spitz here at [facility] but terminated due to repeated no-shows and medical noncompliance  5. Previously on 6-MP, however this medication was discontinued after patient had undetectable drug levels on a recent admission  6. Recalls being on Humira at one point, but unclear exactly when.  7. He had additional ileocolonic resection by Koruda 09/16/2016, doing clinically much better for about six months after that, started on Stelara getting injections on site to insure adherence.  8. Stricturing ileocolonic Crohn's disease diagnosed 1990  9. Status post ileocecal resection 1998 due to stricture, with segmental ileocolectomy 2009 due to severe recurrent stricturing Crohn's disease.  10. On Remicade for a short time in the past, however discontinued due to noncompliance and transportation problems.  11. Previous he followed by Dr. Gwenith Spitz here at [facility] but terminated due to repeated no-shows and medical noncompliance  12. Previously on 6-MP, however this medication was discontinued after patient had undetectable drug levels on a recent admission  13. Recalls being on Humira at one point, but unclear exactly when.  14. He had additional ileocolonic resection by Koruda 09/16/2016, doing clinically much better for about six months after that, started on Stelara getting injections on site to insure adherence.  15. Symptoms improved and gained weight after surgery, did very well for several months, then developed nausea and vomiting and recurrent chronic abdominal pain. Imaging and labs were reassuring against flare and colonoscopy was without active disease.        Designated Environmental health practitioner:  Mr. Cardiff currently has decisional capacity for healthcare decision-making and is able to designate a surrogate healthcare decision maker. Mr. Barrick designated healthcare decision maker(s) is/are Damarkus Balis (the patient's spouse) as denoted by stated patient preference.    Allergies:  Hydrocodone-acetaminophen, Tramadol, Dilaudid [hydromorphone], Morphine, and Opioids - morphine analogues    Medications:   Prior to Admission medications    Medication Dose, Route, Frequency   busPIRone (BUSPAR) 10 MG tablet 10 mg, Oral, 2 times a day (standard), Takes as needed   dronabinoL (MARINOL) 10 MG capsule TAKE 1 CAPSULE BY MOUTH TWICE DAILY BEFORE A MEAL   gabapentin (NEURONTIN) 100 MG capsule 100 mg, Oral, 3 times a day (standard)   gabapentin (NEURONTIN) 300 MG capsule 300 mg, Oral, 3 times a day (standard), Takes as needed   hydroxyzine (ATARAX) 25 MG tablet 25 mg, Oral, Every 6 hours PRN   methylPREDNISolone (MEDROL, PAK,) 4 mg tablet follow package directions   naloxone (NARCAN) 4 mg nasal spray For respiratory depression from opioids   ondansetron (ZOFRAN ODT) 4 MG disintegrating tablet Dissolve 1 tablet (4 mg total) on the tongue every six (6) hours as needed for nausea for up to 10 doses.   oxyCODONE (ROXICODONE) 5 MG immediate release tablet 5 mg, Oral, Every 8 hours PRN   oxyCODONE-acetaminophen (PERCOCET) 10-325 mg per tablet 1 tablet, Oral, Every 8 hours PRN   prochlorperazine (COMPAZINE) 5 MG tablet 5-10 mg, Oral, Every 8 hours PRN   sildenafil (VIAGRA) 50 MG tablet 50 mg, Oral, Daily PRN   ustekinumab (STELARA) 90 mg/mL Syrg syringe Inject the contents of 1 syringe (90 mg total) under the skin every 8 weeks.   dronabinoL (MARINOL) 10 MG capsule TAKE 1 CAPSULE BY MOUTH TWICE DAILY BEFORE A MEAL       Medical History:  Past Medical History:   Diagnosis Date   ??? Anxiety    ??? Crohn's disease (CMS-HCC)     diagnosed in 1990   ??? GERD (gastroesophageal reflux disease)    ??? Hypertension 10/08/2016       Surgical History:  Past Surgical History:   Procedure Laterality Date   ??? COLON SURGERY     ??? PR COLONOSCOPY FLX DX W/COLLJ SPEC WHEN PFRMD Left 11/10/2012    Procedure: COLONOSCOPY, FLEXIBLE, PROXIMAL TO SPLENIC FLEXURE; DIAGNOSTIC, W/WO COLLECTION SPECIMEN BY BRUSH OR WASH;  Surgeon: Malcolm Metro, MD;  Location: [facility];  Service: Gastroenterology   ??? PR COLONOSCOPY FLX DX W/COLLJ SPEC WHEN PFRMD N/A 07/21/2013    Procedure: COLONOSCOPY, FLEXIBLE, PROXIMAL TO SPLENIC FLEXURE; DIAGNOSTIC, W/WO COLLECTION SPECIMEN BY BRUSH OR WASH;  Surgeon: Gwen Pounds, MD;  Location: GI [facility];  Service: Gastroenterology   ??? PR COLONOSCOPY FLX DX W/COLLJ SPEC WHEN PFRMD N/A 08/09/2016    Procedure: COLONOSCOPY, FLEXIBLE, PROXIMAL TO SPLENIC FLEXURE; DIAGNOSTIC, W/WO COLLECTION SPECIMEN BY BRUSH OR WASH;  Surgeon: Janyth Pupa, MD;  Location: GI PROCEDURES [facility];  Service: Gastroenterology   ??? PR COLONOSCOPY FLX DX W/COLLJ SPEC WHEN PFRMD N/A 04/06/2018    Procedure: COLONOSCOPY, FLEXIBLE, PROXIMAL TO SPLENIC FLEXURE; DIAGNOSTIC, W/WO COLLECTION SPECIMEN BY BRUSH OR WASH;  Surgeon: Zetta Bills, MD;  Location: GI  PROCEDURES [facility];  Service: Gastroenterology   ??? PR COLONOSCOPY W/BIOPSY SINGLE/MULTIPLE  07/23/2012    Procedure: COLONOSCOPY, FLEXIBLE, PROXIMAL TO SPLENIC FLEXURE; WITH BIOPSY, SINGLE OR MULTIPLE;  Surgeon: Vickii Chafe, MD;  Location: GI PROC[facility]URES [facility];  Service: Gastroenterology   ??? PR COLONOSCOPY W/BIOPSY SINGLE/MULTIPLE N/A 07/15/2014    Procedure: COLONOSCOPY, FLEXIBLE, PROXIMAL TO SPLENIC FLEXURE; WITH BIOPSY, SINGLE OR MULTIPLE;  Surgeon: Janyth Pupa, MD;  Location: GI [facility]URES [facility];  Service: Gastroenterology   ??? PR COLONOSCOPY W/BIOPSY SINGLE/MULTIPLE N/A 03/21/2017    Procedure: COLONOSCOPY, FLEXIBLE, PROXIMAL TO SPLENIC FLEXURE; WITH BIOPSY, SINGLE OR MULTIPLE;  Surgeon: Modena Nunnery, MD;  Location: GI PROCEDURES [facility];  Service: Gastroenterology   ??? PR COLSC FLEXIBLE W/TRANSENDOSCOPIC BALLOON DILAT N/A 07/15/2014    Procedure: COLONOSCOPY, FLEXIBLE; WITH DILATION BY BALLOON, 1 OR MORE STRICTURES;  Surgeon: Janyth Pupa, MD;  Location: GI PROC[facility]URES [facility];  Service: Gastroenterology   ??? PR REMVL COLON & TERM ILEUM W/ILEOCOLOSTOMY N/A 09/16/2016    Procedure: COLECTOMY, PARTIAL, WITH REMOVAL OF TERMINAL ILEUM WITH ILEOCOLOSTOMY;  Surgeon: Lady Gary, MD;  Location: MAIN OR [place];  Service: Gastrointestinal   ??? PR SHLDR ARTHROSCOP,PART ACROMIOPLAS Right 10/02/2018    Procedure: R16 ARTHROSCOPY, SHOULDER, SURGICAL; DECOMPRESS SUBACROMIAL SPACE W/PART ACROMIOPLASTY, Tamala Bari;  Surgeon: Gonzella Lex, MD;  Location: ASC OR [facility];  Service: Orthopedics   ??? PR SHLDR ARTHROSCOP,SURG,W/ROTAT CUFF REPR Right 10/02/2018    Procedure: ARTHROSCOPY, SHOULDER, SURGICAL; WITH ROTATOR CUFF REPAIR;  Surgeon: Gonzella Lex, MD;  Location: ASC OR [facility];  Service: Orthopedics   ??? PR UPPER GI ENDOSCOPY,BIOPSY N/A 07/23/2012    Procedure: UGI ENDOSCOPY; WITH BIOPSY, SINGLE OR MULTIPLE;  Surgeon: Vickii Chafe, MD;  Location: GI [facility];  Service: Gastroenterology   ??? PR UPPER GI ENDOSCOPY,DIAGNOSIS N/A 11/10/2012    Procedure: UGI ENDO, INCLUDE ESOPHAGUS, STOMACH, & DUODENUM &/OR JEJUNUM; DX W/WO COLLECTION SPECIMN, BY BRUSH OR WASH;  Surgeon: Malcolm Metro, MD;  Location: GI [facility];  Service: Gastroenterology   ??? PR UPPER GI ENDOSCOPY,DIAGNOSIS N/A 07/21/2013    Procedure: UGI ENDO, INCLUDE ESOPHAGUS, STOMACH, & DUODENUM &/OR JEJUNUM; DX W/WO COLLECTION SPECIMN, BY BRUSH OR WASH;  Surgeon: Gwen Pounds, MD;  Location: GI PROCEDURES [facility];  Service: Gastroenterology   ??? PR UPPER GI ENDOSCOPY,DIAGNOSIS N/A 07/15/2014    Procedure: UGI ENDO, INCLUDE ESOPHAGUS, STOMACH, & DUODENUM &/OR JEJUNUM; DX W/WO COLLECTION SPECIMN, BY BRUSH OR WASH;  Surgeon: Janyth Pupa, MD;  Location: GI PROCEDURES [facility];  Service: Gastroenterology   ??? SPINE SURGERY         Social History:  Social History     Socioeconomic History   ??? Marital status: Single     Spouse name: Not on file   ??? Number of children: Not on file   ??? Years of education: Not on file   ??? Highest education level: Not on file   Occupational History     Comment: not working   Social Needs   ??? Financial resource strain: Not hard at all   ??? Food insecurity     Worry: Never true     Inability: Never true   ??? Transportation needs     Medical: No     Non-medical: No   Tobacco Use   ??? Smoking status: Former Smoker     Packs/day: 1.00     Years: 18.00     Pack  years: 18.00     Types: Cigarettes     Start date: 08/27/2003     Quit date: 06/06/2017     Years since quitting: 1.6   ??? Smokeless tobacco: Never Used   ??? Tobacco comment: Pt smokes 1ppd, Pt is interested in tobacco cessation    Substance and Sexual Activity   ??? Alcohol use: No   ??? Drug use: Not Currently     Types: Marijuana   ??? Sexual activity: Yes     Partners: Female     Birth control/protection: Condom   Lifestyle   ??? Physical activity     Days per week: 3 days     Minutes per session: 30 min   ??? Stress: Very much   Relationships   ??? Social Wellsite geologist on phone: More than three times a week     Gets together: More than three times a week     Attends religious service: 1 to 4 times per year     Active member of club or organization: Yes     Attends meetings of clubs or organizations: 1 to 4 times per year     Relationship status: Married   Other Topics Concern   ??? Exercise Not Asked   ??? Living Situation No     Comment: lives with wife   Social History Narrative   ??? Not on file       Family History:  Family History   Problem Relation Age of Onset   ??? Hyperlipidemia Father    ??? Cancer Father    ??? Cancer Maternal Aunt    ??? Stroke Mother    ??? No Known Problems Sister    ??? No Known Problems Brother    ??? No Known Problems Maternal Uncle    ??? No Known Problems Paternal Aunt    ??? No Known Problems Paternal Uncle    ??? No Known Problems Maternal Grandmother    ??? No Known Problems Maternal Grandfather    ??? No Known Problems Paternal Grandmother    ??? No Known Problems Paternal Grandfather    ??? Anesthesia problems Neg Hx    ??? Broken bones Neg Hx    ??? Clotting disorder Neg Hx    ??? Collagen disease Neg Hx    ??? Diabetes Neg Hx    ??? Dislocations Neg Hx    ??? Fibromyalgia Neg Hx    ??? Gout Neg Hx    ??? Hemophilia Neg Hx    ??? Osteoporosis Neg Hx    ??? Rheumatologic disease Neg Hx    ??? Scoliosis Neg Hx    ??? Severe sprains Neg Hx    ??? Sickle cell anemia Neg Hx    ??? Spinal Compression Fracture Neg Hx        Review of Systems:  ROS Negative unless otherwise mentioned in the HPI above     Objective:   Physical Exam:  Temp:  [36.7 ??C] 36.7 ??C  Heart Rate:  [66] 66  Resp:  [16] 16  BP: (132)/(78) 132/78  SpO2:  [100 %] 100 %    Gen: Chronically ill-appearing, middle-aged man, thin, alert and oriented x3, nontoxic-appearing  HEENT: atraumatic, normocephalic, sclera anicteric, EOMI, PERRLA, MMM. OP w/o erythema or exudate   Neck: no cervical lymphadenopathy or thyromegaly, no JVD  Heart: RRR, S1, S2, no M/R/G, no chest wall tenderness  Lungs: CTAB, no crackles or wheezes, no use of accessory muscles  Abdomen: Soft, nondistended, diffusely tender to superficial palpation, no rebound tenderness, positive guarding, normal bowel sounds  Extremities: no clubbing, cyanosis, or edema: pulses are +2 in bilateral upper and lower extremities  Neuro: CN II-XI grossly intact. No focal deficits.  Skin:  No rashes, lesions  Psych: Irritable    Labs/Studies:  Labs and Studies from the last 24hrs per EMR and Reviewed    Imaging: Radiology studies were personally reviewed

## 2019-02-09 LAB — TOXICOLOGY SCREEN, URINE
COCAINE(METAB.)SCREEN, URINE: <150
METHADONE SCREEN, URINE: <300

## 2019-02-09 LAB — BARBITURATE SCREEN URINE: Lab: <200

## 2019-02-09 NOTE — Unmapped (Addendum)
Rodney Bender is a 49 y.o. male with PMH ileocolonic Crohn's disease (diagnosed in 62) status post ileocecal resection x4, anxiety being transferred from [facility] to [facility] for evaluation by GI for development of 3 days of abdominal pain, diarrhea, and episode of syncope.     Abdominal pain, diarrhea, syncope - concern for Crohn's flare:   Admitted for 3 days of abdominal pain and syncopal episode in car. OSH CT a/p c/f bowel wall thickening. Differential diagnosis Crohn's flare versus gastroenteritis versus small bowel obstruction. Peritonitis unlikely given stability.??TTE WNL and telemetry negative at 24 hours; syncope likely vasovagal in response to pain. Admitted with no bowel movements for multiple days, so C diff and infectious gastroenteritis unlikely. Pain improved. GI Surg also consulted for options, but recommended non-operative mgmt with aggressive bowel regimen. Zyprexa trialed and now scheduled nightly for nausea (should also help with underlying anxiety). Miralax bowel prep started previous night with goal for c-scope before long weekend. C-scope completed, largely normal. GI rec'd to stop steroids, continue Stelara, and anticipate discharge if tolerates soft residue diet. If continued pain, consider functional overlay. Tolerated diet before discharge.  -- Discharged with scripts for nightly zyprexa/compazine for nausea.  -- Per GI, no steroids, continue stelara q8 weeks.  -- Daily miralax on discharge  -- Follow-up path from cscope  -- Emphasized importance of refraining from narcotics and to take tylenol for pain if tolerated.   ??  Ileocolonic Crohn's disease: Followed by Dr. Elizebeth Brooking of [facility].  Diagnosed in 1990. Status post 4 ileocolonic resections. Colonoscopy 04/06/2018 limited due to poor preparation with stool in the entire examined colon, however patient end-to-side ileocolonic anastomosis, characterized by healthy-appearing mucosa and neoterminal ileum was normal. ???Stelara every 8 weeks as an outpatient  --Outpatient follow-up to be arranged with Dr. Elizebeth Brooking    Syncope. Patient with reported episode of syncope in the setting of severe pain. No arrhythmias noted on telemetry. Orthostatics negative. Likely vagal response to severe pain.

## 2019-02-09 NOTE — Unmapped (Signed)
Adult Nutrition Assessment Note    Visit Type: MD Consult  Reason for Visit: Per Admission Nutrition Screen (Adult), Have you had a decrease in food intake or appetite?    ASSESSMENT:  HPI & PMH: Patient is a 49 y.o. male admitted for abdominal pain, diarrhea, syncope, concern for crohn's flare, Ileocolonic Crohn's disease    PMH pertinent for Anxiety, Crohn's disease, GERD, HTN    Per H&P,  denies fevers, chills, lightheadedness or dizziness, changes in urination.  Denies any changes in diet, however, however has had decreased p.o. intake.  Denies eating new restaurants.  Denies similar symptoms in family members or friends.Marland KitchenMarland KitchenHe does report history of C. difficile couple years ago.    Pertinent Events Since Admission: Radiology ruled out bowel obstruction or narrowing as cause of pt symptoms per MD note.    Nutrition Hx: Pt is currently on a clear liquid diet and is tolerating it well.  He has currently been admitted for 1 days. Patient reports poor PO x6 days PTA due to poor appetite, eating basically nothing.    Nutritionally Pertinent Meds: Reviewed.   Senokot    Labs: Na 128, Glucose 59    Abd/GI: Last documented BM 02/04/19    Skin:   Patient Lines/Drains/Airways Status    Active Wounds     Name:   Placement date:   Placement time:   Site:   Days:    Surgical Site 10/02/18 Shoulder Right   10/02/18    1525     130               Current nutrition therapy order:   Nutrition Orders          Nutrition Therapy Clear Liquid starting at 01/11 0211           Anthropometric Data:  -- Height: 180.3 cm (5' 10.98)   -- Last recorded weight: 59.7 kg (131 lb 9.8 oz)  -- Admission weight: 59.7 kg  -- Weight changes this admission: No changes since admission.   -- IBW: 78.03 kg  -- Percent IBW: 76.5%  -- BMI: Body mass index is 18.36 kg/m??.     Last 5 Recorded Weights    02/08/19 1554 02/09/19 1417 02/09/19 1433   Weight: 59.7 kg (131 lb 11.2 oz) 59.7 kg (131 lb 9.8 oz) 59.7 kg (131 lb 9.8 oz) -- Weight history PTA: Pt is unsure of UBW, around 64 kg question accuracy of given wt history documented. 2% wt loss in 5 months per documented wts.    Wt Readings from Last 10 Encounters:   02/09/19 59.7 kg (131 lb 9.8 oz)   10/02/18 61.1 kg (134 lb 11.2 oz)   04/06/18 59.4 kg (131 lb)   03/27/18 59.6 kg (131 lb 4.8 oz)   03/06/18 63 kg (138 lb 14.4 oz)   02/27/18 63.8 kg (140 lb 11.2 oz)   01/13/18 63 kg (138 lb 12.8 oz)   12/30/17 58.5 kg (129 lb)   12/20/17 59.2 kg (130 lb 8 oz)   12/09/17 57.8 kg (127 lb 6.8 oz)        Daily Estimated Nutrient Needs:   Energy: 1492-1791 kcals [25-30 kcal/kg using admission body weight, 59.7 kg (02/09/19 1433)]  Protein: 72-90 gm [1.2-1.5 gm/kg using last recorded weight, 59.7 kg (02/09/19 1433)]  Carbohydrate:   [no restriction]  Fluid: 1610-9604 [1 mL/kcal (maintenance)]     Nutrition Focused Physical Exam:  Fat Areas Examined  Orbital: Moderate loss  Upper Arm: Moderate  loss      Muscle Areas Examined  Temple: Moderate loss  Clavicle: Moderate loss  Acromion: Moderate loss  Dorsal Hand: Moderate loss  Patellar: Moderate loss  Anterior Thigh: Moderate loss  Posterior Calf: Moderate loss    DIAGNOSIS:  Malnutrition Assessment using AND/ASPEN Clinical Characteristics:    Non-severe (Moderate) Protein-Calorie Malnutrition in the context of acute illness or injury (02/09/19 1425)  Energy Intake: < or equal to 50% of estimated energy requirement for > or equal to 5 days  Fat Loss: Mild  Muscle Loss: Mild     Overall nutrition impression:   Pt is likely not meeting estimated nutrition needs due to Clear liquid diet, Poor PO intake PTA, and Crohn's flare vs enteric infection. Pt would benefit from diet progression as tolerated. Pt would benefit from a clear liquid supplement due to clear liquid diet order to increase intake.    RECOMMENDATIONS AND INTERVENTIONS:  -Advance diet as tolerated  -Consider high protein, high kcal diet  -Consider adding Ensure clear supplement -Consider adding snacks when PO diet is tolerated    RD Follow Up Parameters:  1-2 times per week (and more frequent as indicated)     Thank you for allowing me to participate in the care of this patient. Please feel free to page me with any questions.     Jenny Reichmann, Dietetic Intern    Karie Chimera, MS, RD, LDN, CNSC  Pager: [phone]

## 2019-02-09 NOTE — Unmapped (Signed)
VSS, pt A+Ox4. Pt had one episode of emesis last night relieved by PRN zofran. Pt had complaints of pain tx with prn oxy and morphine. Pt slept through most of the night. Pt expressed concerns related to treatment plan and diagnosis he also explained that he frequently has trouble with anxiety. No falls or acute injuries through out shift. Will CTM.     Problem: Adult Inpatient Plan of Care  Goal: Plan of Care Review  Outcome: Ongoing - Unchanged  Goal: Patient-Specific Goal (Individualization)  Outcome: Ongoing - Unchanged  Goal: Absence of Hospital-Acquired Illness or Injury  Outcome: Ongoing - Unchanged  Goal: Optimal Comfort and Wellbeing  Outcome: Ongoing - Unchanged  Goal: Readiness for Transition of Care  Outcome: Ongoing - Unchanged  Goal: Rounds/Family Conference  Outcome: Ongoing - Unchanged     Problem: Wound  Goal: Optimal Wound Healing  Outcome: Ongoing - Unchanged     Problem: Infection  Goal: Infection Symptom Resolution  Outcome: Ongoing - Unchanged

## 2019-02-09 NOTE — Unmapped (Signed)
Rough start this am; pt was nauseated and waited 4 hours for relief....Marland KitchenMarland KitchenDoing better now: taking Zofran, oxycodone, morphine q 6 hours.  Also tried the bentyl and the simethicone.  Wife visited this afternoon; wondering about CT results from outside hospital. Also wondering if pt has obstruction since he has not had a bowel movement in 4 days.  Continuing enteric precautions for now. Pt on  clear liquids still; trying coffee to promote having a BM.... CTM

## 2019-02-09 NOTE — Unmapped (Signed)
OT consult received and appreciated. Per d/w pt, he remains independent with baseline ADL, and reports no needs. OT will discharge pt from caseload, at this time. Please re-consult if needs arise.

## 2019-02-10 LAB — MAGNESIUM: Magnesium:MCnc:Pt:Ser/Plas:Qn:: 1.9

## 2019-02-10 LAB — COMPREHENSIVE METABOLIC PANEL
ALBUMIN: 4.7 g/dL (ref 3.5–5.0)
ALKALINE PHOSPHATASE: 88 U/L (ref 38–126)
ALT (SGPT): 15 U/L (ref <50)
ANION GAP: 12 mmol/L (ref 7–15)
AST (SGOT): 26 U/L (ref 19–55)
BILIRUBIN TOTAL: 1.2 mg/dL (ref 0.0–1.2)
BLOOD UREA NITROGEN: 20 mg/dL (ref 7–21)
CALCIUM: 9.9 mg/dL (ref 8.5–10.2)
CHLORIDE: 92 mmol/L — ABNORMAL LOW (ref 98–107)
CO2: 25 mmol/L (ref 22.0–30.0)
CREATININE: 0.87 mg/dL (ref 0.70–1.30)
EGFR CKD-EPI AA MALE: >90 mL/min/{1.73_m2} (ref >=60)
EGFR CKD-EPI NON-AA MALE: >90 mL/min/{1.73_m2} (ref >=60)
GLUCOSE RANDOM: 90 mg/dL (ref 70–179)
POTASSIUM: 5.1 mmol/L — ABNORMAL HIGH (ref 3.5–5.0)
PROTEIN TOTAL: 8 g/dL (ref 6.5–8.3)
SODIUM: 129 mmol/L — ABNORMAL LOW (ref 135–145)

## 2019-02-10 LAB — CBC W/ AUTO DIFF
BASOPHILS ABSOLUTE COUNT: 0 10*9/L (ref 0.0–0.1)
BASOPHILS RELATIVE PERCENT: 0.1 %
EOSINOPHILS ABSOLUTE COUNT: 0 10*9/L (ref 0.0–0.4)
EOSINOPHILS RELATIVE PERCENT: 0.1 %
HEMATOCRIT: 39.3 % — ABNORMAL LOW (ref 41.0–53.0)
HEMOGLOBIN: 13.8 g/dL (ref 13.5–17.5)
LARGE UNSTAINED CELLS: 1 % (ref 0–4)
LYMPHOCYTES ABSOLUTE COUNT: 0.8 10*9/L — ABNORMAL LOW (ref 1.5–5.0)
MEAN CORPUSCULAR HEMOGLOBIN CONC: 35.1 g/dL (ref 31.0–37.0)
MEAN CORPUSCULAR HEMOGLOBIN: 33 pg (ref 26.0–34.0)
MEAN CORPUSCULAR VOLUME: 94 fL (ref 80.0–100.0)
MEAN PLATELET VOLUME: 8.4 fL (ref 7.0–10.0)
MONOCYTES ABSOLUTE COUNT: 0.3 10*9/L (ref 0.2–0.8)
MONOCYTES RELATIVE PERCENT: 2.8 %
NEUTROPHILS ABSOLUTE COUNT: 10 10*9/L — ABNORMAL HIGH (ref 2.0–7.5)
NEUTROPHILS RELATIVE PERCENT: 89.6 %
RED BLOOD CELL COUNT: 4.18 10*12/L — ABNORMAL LOW (ref 4.50–5.90)
WBC ADJUSTED: 11.2 10*9/L — ABNORMAL HIGH (ref 4.5–11.0)

## 2019-02-10 LAB — HEMATOCRIT: Hematocrit:VFr:Pt:Bld:Qn:: 39.3 — ABNORMAL LOW

## 2019-02-10 LAB — C-REACTIVE PROTEIN: C reactive protein:MCnc:Pt:Ser/Plas:Qn:: 14.5 — ABNORMAL HIGH

## 2019-02-10 LAB — INR: Coagulation tissue factor induced.INR:RelTime:Pt:PPP:Qn:Coag: 1.1

## 2019-02-10 LAB — ERYTHROCYTE SEDIMENTATION RATE: Lab: 11

## 2019-02-10 LAB — PROTEIN TOTAL: Protein:MCnc:Pt:Ser/Plas:Qn:: 8

## 2019-02-10 NOTE — Unmapped (Signed)
Abdominal pain and nausea continue.  Using all prn's for pain/nausea.  Bentyl ineffective.  Starting Solu-Medrol 20 mg every 8 hours.  No BM yet. No urine, per pt, not since mid morning; waiting for sample to send to lab...Marland KitchenMarland KitchenLikely colonoscopy when pt can tolerate prep.  Can order 6 bottles of Gatorade at a time.  CTM.

## 2019-02-10 NOTE — Unmapped (Signed)
VSS, pt A+Ox4. No episodes of emesis overnight, also no BM. Pain and nausea relieved by oxy and zofran. Pt slept through most of the night. No falls or acute injuries. Wife at bedside. Will CTM.     Problem: Adult Inpatient Plan of Care  Goal: Plan of Care Review  Outcome: Ongoing - Unchanged  Goal: Patient-Specific Goal (Individualization)  Outcome: Ongoing - Unchanged  Goal: Absence of Hospital-Acquired Illness or Injury  Outcome: Ongoing - Unchanged  Goal: Optimal Comfort and Wellbeing  Outcome: Ongoing - Unchanged  Goal: Readiness for Transition of Care  Outcome: Ongoing - Unchanged  Goal: Rounds/Family Conference  Outcome: Ongoing - Unchanged     Problem: Wound  Goal: Optimal Wound Healing  Outcome: Ongoing - Unchanged     Problem: Infection  Goal: Infection Symptom Resolution  Outcome: Ongoing - Unchanged

## 2019-02-10 NOTE — Unmapped (Signed)
LUMINAL GASTROENTEROLOGY INPATIENT FOLLOW UP NOTE     Requesting Attending Physician:  Mauri Brooklyn, MD    Reason for Consult:  Rodney Bender is a 49 y.o. male seen in consultation at the request of Dr. Mauri Brooklyn, MD for abdominal pain concerning for Crohn's flare.     Interval History:  Patient started on IV steroids yesterday. He reports not feeling any better although looks clinically improved. Patient up and walking in room but does appear anxious about illness.     ASSESSMENT / PLAN:   This is a??48 y.o.??male??with PMHx of??stricturing ileocolonic Crohn's (dx 1990), s/p ileocecal resection (x4, last in 2018) currently on Stelara, who presents with new onset abdominal pain and syncope since 1/8 concerning for Crohn's flare.     Patient now passing gas since started on IV steroids. Patient likely responding and improving slowly. Anticipate patient may have been obstructed from inflammation obstructing the lumen. Our hope is that the patient will begin having bowel movements and be able to tolerate the bowel prep tomorrow. If this happens, then we could start bowel prep tomorrow and plan for colonoscopy on 1/15. Also, recommend keeping patient on DVT prophylaxis while in the hospital given IBD patients normally have a 2-3 fold risk of VTE compared to non-IBD patients, which increases up to 8 fold during an IBD flare due to their immobilization while in the hospital and active systemic inflammation.     Recommendations:  - Continue IV Solumedrol 20 mg q8h   - If patient has bowel movements, then would start bowel prep tomorrow and plan for colonoscopy on 1/15.  - Continue DVT ppx given increased risk during flare     Thank you for this consult. The patient was Discussed with and seen by Dr. Shawnie Pons. We will continue to follow along. Please page luminal fellow on call with questions.     Shawna Orleans, MD  [employer] [facility]           Medications: Current Facility-Administered Medications   Medication Dose Route Frequency Provider Last Rate Last Admin   ??? acetaminophen (TYLENOL) solution 650 mg  650 mg Oral Q4H PRN De-Vaughn Sima Matas, MD       ??? busPIRone (BUSPAR) tablet 10 mg  10 mg Oral BID Oludamilola A Aladesanmi, MD   10 mg at 02/10/19 1028   ??? dicyclomine (BENTYL) capsule 10 mg  10 mg Oral 4x Daily PRN De-Vaughn Sima Matas, MD   10 mg at 02/09/19 1423   ??? enoxaparin (LOVENOX) syringe 40 mg  40 mg Subcutaneous Q24H SCH Oludamilola A Aladesanmi, MD       ??? melatonin tablet 3 mg  3 mg Oral Nightly PRN De-Vaughn Sima Matas, MD   3 mg at 02/09/19 2139   ??? methylPREDNISolone sodium succinate (PF) (Solu-MEDROL) injection 20 mg  20 mg Intravenous Q8H SCH Oludamilola A Aladesanmi, MD   20 mg at 02/10/19 1407   ??? MORPhine injection 2 mg  2 mg Intravenous Q6H PRN De-Vaughn Sima Matas, MD   2 mg at 02/09/19 1713   ??? OLANZapine (ZYPREXA) tablet 5 mg  5 mg Oral Nightly Oludamilola A Aladesanmi, MD   5 mg at 02/10/19 1408   ??? ondansetron (ZOFRAN) injection 4 mg  4 mg Intravenous Q6H PRN Sallyanne Havers, MD   4 mg at 02/10/19 1203   ??? oxyCODONE (ROXICODONE) immediate release tablet 10 mg  10 mg Oral Q6H PRN De-Vaughn Sima Matas, MD   10 mg  at 02/10/19 1030   ??? prochlorperazine (COMPAZINE) injection 5 mg  5 mg Intravenous Q8H PRN Sallyanne Havers, MD   5 mg at 02/10/19 0813   ??? senna (SENOKOT) tablet 1 tablet  1 tablet Oral BID Gracelyn Nurse, MD   1 tablet at 02/10/19 0813   ??? simethicone (MYLICON) chewable tablet 80 mg  80 mg Oral Q6H PRN Waymon Amato, MD   80 mg at 02/08/19 1809       Vital Signs:  Temp:  [36.1 ??C-37.1 ??C] 36.4 ??C  Heart Rate:  [53-60] (P) 53  Resp:  [12-16] (P) 14  BP: (120-132)/(72-76) (P) 112/71  MAP (mmHg):  [87-94] (P) 87  SpO2:  [96 %-99 %] (P) 98 %    Intake/Output last 3 shifts:  No intake/output data recorded.    Physical Exam:  Constitutional: In mild distress, pacing across room, anxious appearing HEENT: no sleral icterus  CV: Regular  Pulmonary: normal WOB  Abdomen: firm, non-distended, diffusely tender to superficial palpation  Skin: No jaundice   Extremities: warm, no LE edema  Neuro: Alert, Oriented x 3, No focal deficits.    Diagnostic Studies:   Labs:  Recent Labs     02/08/19  0617 02/10/19  0905   WBC 13.1* 11.2*   HGB 14.0 13.8   HCT 43.3 39.3*   PLT 281 327     Recent Labs     02/08/19  0617 02/10/19  0905   NA 128* 129*   K 5.0 5.1*   CL 93* 92*   BUN 24* 20   CREATININE 1.05 0.87   GLU 59* 90     Recent Labs     02/08/19  0617 02/10/19  0905   PROT 7.1 8.0   ALBUMIN 4.0 4.7   AST 25 26   ALT 13 15   ALKPHOS 84 88   BILITOT 1.3* 1.2     Recent Labs     02/08/19  0617 02/10/19  0905   INR 1.07 1.10       Imaging/Procedures:  KUB from 02/08/2019:  ??  IMPRESSION:  Nonobstructive bowel gas pattern.    CT abdomen and pelvis:  IMPRESSION:  ??  --Diffuse small bowel wall thickening is seen with surrounding free fluid consistent with enteritis.  ??  --Tiny wedge-shaped hypoenhancing area in the lower pole of the left kidney with no perinephric inflammatory stranding, nonspecific but suggestive of focal scarring.  Focal pyelonephritis and renal infarct are felt less likely given tiny nature of findings.

## 2019-02-10 NOTE — Unmapped (Signed)
LUMINAL GASTROENTEROLOGY INPATIENT FOLLOW UP NOTE     Requesting Attending Physician:  Mauri Brooklyn, MD    Reason for Consult:  Rodney Bender is a 49 y.o. male seen in consultation at the request of Dr. Mauri Brooklyn, MD for acute onset of abdominal pain concerning for infection vs. Crohn's flare.     Interval History:  Patient without improvement from yesterday. Episode of emesis overnight that was non-bloody. Still not passing gas or stool. Patient tearful during interaction.    ASSESSMENT / PLAN:   This is a 49 y.o. male with PMHx of stricturing ileocolonic Crohn's (dx 1990), s/p ileocecal resection (x4, last in 2018) currently on Stelara, who presents with new onset abdominal pain and syncope since 1/8.     Patient with obstipation concerning for possible obstruction. Patient not improving clinically and in significant amount of pain. Patient could have an adhesion causing obstruction, which would be more likely given his acute onset. However, not seen on CT and would expect patient to be significantly worse by now if still present. Patient could also have a luminal obstruction from inflammation, although this tends to be a more slow process. After discussion with Dr. Gwenith Spitz and Dr. Shawnie Pons, team in agreement to start IV Solumedrol 20 mg q8h even though infectious workup not collected. Patient has not had a bowel movement since 1/7 and unlikely to be obtained at this point. Our hope is that the steroids will decrease the inflammation and patient will begin passing gas and stool. At that point, patient would prep for a colonoscopy so we can re-stage his Crohn's disease.     Recommendations:  - Start IV Solumedrol 20 mg q8h   - Hold Miralax   - Once patient improves clinically from obstruction and begins having bowel movements, would start bowel prep in preparation for colonoscopy inpatient. Thank you for this consult. The patient was Discussed with and seen by Dr. Shawnie Pons as well as discussed with Dr. Gwenith Spitz. We will continue to follow along. Please page luminal fellow on call with questions.     Shawna Orleans, MD  [employer] [facility]           Medications:  Current Facility-Administered Medications   Medication Dose Route Frequency Provider Last Rate Last Admin   ??? acetaminophen (TYLENOL) solution 650 mg  650 mg Oral Q4H PRN De-Vaughn Sima Matas, MD       ??? dicyclomine (BENTYL) capsule 10 mg  10 mg Oral 4x Daily PRN De-Vaughn Sima Matas, MD   10 mg at 02/09/19 1423   ??? melatonin tablet 3 mg  3 mg Oral Nightly PRN De-Vaughn Sima Matas, MD   3 mg at 02/08/19 2152   ??? methylPREDNISolone sodium succinate (PF) (Solu-MEDROL) injection 20 mg  20 mg Intravenous Q8H SCH Oludamilola A Aladesanmi, MD   20 mg at 02/09/19 1605   ??? MORPhine injection 2 mg  2 mg Intravenous Q6H PRN De-Vaughn Sima Matas, MD   2 mg at 02/09/19 1111   ??? ondansetron (ZOFRAN) injection 4 mg  4 mg Intravenous Q6H PRN Sallyanne Havers, MD   4 mg at 02/09/19 1117   ??? oxyCODONE (ROXICODONE) immediate release tablet 10 mg  10 mg Oral Q6H PRN De-Vaughn Sima Matas, MD   10 mg at 02/09/19 1423   ??? prochlorperazine (COMPAZINE) injection 5 mg  5 mg Intravenous Q8H PRN Sallyanne Havers, MD   5 mg at 02/09/19 0905   ??? senna (SENOKOT) tablet 1 tablet  1 tablet Oral BID Gracelyn Nurse, MD   1 tablet at 02/09/19 1108   ??? simethicone (MYLICON) chewable tablet 80 mg  80 mg Oral Q6H PRN Waymon Amato, MD   80 mg at 02/08/19 1809       Vital Signs:  Temp:  [35.8 ??C-36.8 ??C] 36.4 ??C  Heart Rate:  [51-68] 57  Resp:  [16] 16  BP: (115-157)/(72-92) 115/72  MAP (mmHg):  [89-105] 89  SpO2:  [98 %-99 %] 99 %  BMI (Calculated):  [18.36] 18.36    Intake/Output last 3 shifts:  I/O last 3 completed shifts:  In: -   Out: 2 [Urine:2]    Physical Exam:  Constitutional: In mild-moderate distress, chronically ill appearing, thin. HEENT: no sleral icterus  CV: Regular  Pulmonary: normal WOB  Abdomen: firm, hypoactive bowel sounds, non-distended, +diffusely tender to superficial palpation with guarding throughout.  Skin: No jaundice   Extremities: warm, no LE edema  Neuro: Alert, Oriented x 3, No focal deficits.    Diagnostic Studies:   Labs:  Recent Labs     02/08/19  0617   WBC 13.1*   HGB 14.0   HCT 43.3   PLT 281     Recent Labs     02/08/19  0617   NA 128*   K 5.0   CL 93*   BUN 24*   CREATININE 1.05   GLU 59*     Recent Labs     02/08/19  0617   PROT 7.1   ALBUMIN 4.0   AST 25   ALT 13   ALKPHOS 84   BILITOT 1.3*     Recent Labs     02/08/19  0617   INR 1.07       Imaging/Procedures:  CT A/P from OSH:  IMPRESSION:  ??  --Diffuse small bowel wall thickening is seen with surrounding free fluid consistent with enteritis.  ??  --Tiny wedge-shaped hypoenhancing area in the lower pole of the left kidney with no perinephric inflammatory stranding, nonspecific but suggestive of focal scarring. ??Focal pyelonephritis and renal infarct are felt less likely given tiny nature of findings.  ??  Abdominal US:  N/A  ??  Microbiology:  COVID-19 negative on 1/10  ??  GI Procedures:   Colonoscopy 04/06/2018:   Findings:  ??????????The perianal and digital rectal examinations were normal. Pertinent   ??????????negatives include no palpable rectal lesions.  ??????????A moderate amount of semi-solid stool was found in the entire colon,   ??????????interfering with visualization.  ??????????There was evidence of a prior end-to-side ileo-colonic anastomosis at   ??????????the ileocecal valve. This was patent and was characterized by healthy   ??????????appearing mucosa. The anastomosis was traversed.  ??????????The neo-terminal ileum was evaluated for 10+ cm and appeared normal.  ??????????Inflammation was not found based on the endoscopic appearance of the   ??????????mucosa. This was graded as Rutgeerts Score i0 (no lesions in the distal ??????????ileum), and when compared to previous examinations, the findings are in   ??????????remission.  ????????????????????????????????????????????????????????????????????????????????????????????????????????????????????????????????????????????????????????????  Impression: ??????????????????- Preparation of the colon was poor.  ??????????????????????????????????????????- Stool in the entire examined colon.  ??????????????????????????????????????????- Patent end-to-side ileo-colonic anastomosis,   ??????????????????????????????????????????characterized by healthy appearing mucosa.  ??????????????????????????????????????????- The examined portion of the neo-terminal ileum was   ??????????????????????????????????????????normal (Rutgeert's i0). .  ??????????????????????????????????????????- No specimens collected.  ??  EGD 07/15/2014:  Findings:  ??????????The examined esophagus was normal.  ??????????The entire examined stomach was normal.  ??????????The examined duodenum was normal.  ??????????????????????????????????????????????????????????????????????????????????????????????????????????????????????????????????????????????????????????????  Impression: ??????????????- Normal esophagus.  ??????????????????????????????????????- Normal stomach.  ??????????????????????????????????????- Normal examined duodenum.  ??????????????????????????????????????- No specimens collected.

## 2019-02-10 NOTE — Unmapped (Signed)
Internal Medicine ([facility]) Progress Note    Assessment & Plan:   Rodney Bender is a 49 y.o. male with a PMHx of ileocolonic Crohn's disease (diagnosed in 76) status post ileocecal resection x4, transferred from [facility]  to [facility] with 3 days of abdominal pain and new syncope.    Principal Problem:    Abdominal pain  Active Problems:    Intestinal bypass or anastomosis status    Crohn's disease of both small and large intestine with other complication (CMS-HCC)    Chronic abdominal pain  Resolved Problems:    * No resolved hospital problems. *      Abdominal pain, syncope - concern for Crohn's flare: Admitted for 3 days of abdominal pain and syncopal episode in car. OSH CT a/p c/f bowel wall thickening. Differential diagnosis Crohn's flare versus gastroenteritis versus small bowel obstruction versus peritonitis. TTE WNL and telemetry negative at 24 hours; syncope likely vasovagal in response to pain. Currently no bowel movements for multiple days. Very agitated, complains of nausea as well as pain. Considered ileus or obstipations, but overnight KUB reassuring as well as low stool burden on CT. C diff and GIPP sent, but no bowel movement for >24 hours since admission, so enteric precautions discontinued. Would consider further investigation with colonoscopy. Concern for Crohn's flare. Will observe response to steroids; if making BMs, will start prep for c-scope.  --GI following, appreciate recs  --f/u C diff and GIPP  --Bentyl QID PRN for cramping  --Compazine for nausea (Zofran trialed, patient reported not effective)  --Senna every day given opioids  --holding Miralax  --start solumedrol 20mg  q8h   --if BMs, start bowel prep for c-scope TM  --Tylenol for mild pain, oxy 10 q6h PRN for mod, IV morphine 2 q6h PRN for severe    Ileocolonic Crohn's disease: Followed by Dr. Elizebeth Brooking of [facility].  Diagnosed in 1990.  Status post 4 ileocolonic resection. Colonoscopy 04/06/2018 limited due to poor preparation with stool in the entire examined colon, however patient end-to-side ileocolonic anastomosis, characterized by healthy-appearing mucosa and neoterminal ileum was normal.  --GI as above  --Stelara q8w as outpatient    Daily Checklist:  Diet: Clear Liquid Diet  DVT PPx: Not Indicated - Padua Score <4  Electrolytes: Replete Potassium to >/= 3.6 and Magnesium to >/= 1.8  Code Status: Full Code  Dispo: Home    Subjective:   No acute events overnight.    Objective:   Temp:  [35.8 ??C-36.8 ??C] 36.4 ??C  Heart Rate:  [51-68] 57  Resp:  [16] 16  BP: (115-157)/(72-92) 115/72  SpO2:  [98 %-99 %] 99 %    Gen: alert, oriented, answers questions appropriately  HEENT: atraumatic, sclera anicteric, MMM. OP w/o erythema or exudate   Heart: RRR, S1, S2, no M/R/G, no chest wall tenderness  Lungs: CTAB, no crackles or wheezes, no use of accessory muscles  Abdomen: modest bowel sounds, soft, diffusely TTP, non-distended, no rebound/guarding  Extremities: no clubbing, cyanosis, or edema  Psych: Appropriate mood and affect    Labs/Studies: Labs and Studies from the last 24hrs per EMR and Reviewed

## 2019-02-10 NOTE — Unmapped (Signed)
Internal Medicine (MDU) Progress Note    Assessment & Plan:   Rodney Bender is a 49 y.o. male with a PMHx of ileocolonic Crohn's disease (diagnosed in 58) status post ileocecal resection x4, transferred from [facility]  to [facility] with 3 days of abdominal pain and new syncope.    Principal Problem:    Abdominal pain  Active Problems:    Intestinal bypass or anastomosis status    Crohn's disease of both small and large intestine with other complication (CMS-HCC)    Chronic abdominal pain  Resolved Problems:    * No resolved hospital problems. *      Abdominal pain, syncope - concern for Crohn's flare: Admitted for 3 days of abdominal pain and syncopal episode in car. OSH CT a/p c/f bowel wall thickening. Differential diagnosis Crohn's flare versus gastroenteritis versus small bowel obstruction versus peritonitis. TTE WNL and telemetry negative at 24 hours; syncope likely vasovagal in response to pain. Currently no bowel movements for multiple days, so infectious etiology ruled out. Appears in pain. GI concerned for obstipation. On Solumedrol for c/f Crohn's flare. Per GI, will observe response to steroids; if making BMs, will start prep for c-scope. Will start Zyprexa at bedtime for nausea - should also help with anxiety.  --GI following, appreciate recs  --start Zyprexa at bedtime   --Bentyl QID PRN for cramping  --Compazine for nausea (Zofran trialed, patient reported not effective)  --Senna every day given opioids  --holding Miralax  --start solumedrol 20mg  q8h   --if BMs, start bowel prep for c-scope TM  --Tylenol for mild pain, oxy 10 q6h PRN for mod, IV morphine 2 q6h PRN for severe    Anxiety: Document outpatient history, takes Buspar 10 BID PRN (unclear why PRN).  - start Buspar 10 BID  - Zyprexa as above    Ileocolonic Crohn's disease: Followed by Dr. Elizebeth Brooking of [facility].  Diagnosed in 1990.  Status post 4 ileocolonic resection. Colonoscopy 04/06/2018 limited due to poor preparation with stool in the entire examined colon, however patient end-to-side ileocolonic anastomosis, characterized by healthy-appearing mucosa and neoterminal ileum was normal.  --GI as above  --Stelara q8w as outpatient    Daily Checklist:  Diet: Clear Liquid Diet  DVT PPx: Not Indicated - Padua Score <4  Electrolytes: Replete Potassium to >/= 3.6 and Magnesium to >/= 1.8  Code Status: Full Code  Dispo: Home    Subjective:   No acute events overnight.    Objective:   Temp:  [36.1 ??C-37.1 ??C] 36.4 ??C  Heart Rate:  [53-60] (P) 53  Resp:  [12-16] (P) 14  BP: (120-132)/(72-76) (P) 112/71  SpO2:  [96 %-99 %] (P) 98 %    Gen: alert, oriented, answers questions appropriately  HEENT: atraumatic, sclera anicteric, MMM. OP w/o erythema or exudate   Heart: RRR, S1, S2, no M/R/G, no chest wall tenderness  Lungs: CTAB, no crackles or wheezes, no use of accessory muscles  Abdomen: improved bowel sounds, soft, diffusely TTP, non-distended, no rebound/guarding  Extremities: no clubbing, cyanosis, or edema  Psych: Appropriate mood and affect    Labs/Studies: Labs and Studies from the last 24hrs per EMR and Reviewed

## 2019-02-11 LAB — RED BLOOD CELL COUNT: Lab: 4.21 — ABNORMAL LOW

## 2019-02-11 LAB — CBC W/ AUTO DIFF
BASOPHILS ABSOLUTE COUNT: 0 10*9/L (ref 0.0–0.1)
BASOPHILS RELATIVE PERCENT: 0.1 %
EOSINOPHILS ABSOLUTE COUNT: 0 10*9/L (ref 0.0–0.4)
EOSINOPHILS RELATIVE PERCENT: 0.1 %
HEMATOCRIT: 39.4 % — ABNORMAL LOW (ref 41.0–53.0)
HEMOGLOBIN: 13.2 g/dL — ABNORMAL LOW (ref 13.5–17.5)
LARGE UNSTAINED CELLS: 1 % (ref 0–4)
LYMPHOCYTES ABSOLUTE COUNT: 1.2 10*9/L — ABNORMAL LOW (ref 1.5–5.0)
MEAN CORPUSCULAR HEMOGLOBIN CONC: 33.6 g/dL (ref 31.0–37.0)
MEAN CORPUSCULAR HEMOGLOBIN: 31.5 pg (ref 26.0–34.0)
MEAN CORPUSCULAR VOLUME: 93.7 fL (ref 80.0–100.0)
MEAN PLATELET VOLUME: 8.2 fL (ref 7.0–10.0)
MONOCYTES ABSOLUTE COUNT: 0.7 10*9/L (ref 0.2–0.8)
MONOCYTES RELATIVE PERCENT: 5.3 %
NEUTROPHILS ABSOLUTE COUNT: 10.4 10*9/L — ABNORMAL HIGH (ref 2.0–7.5)
PLATELET COUNT: 316 10*9/L (ref 150–440)
RED BLOOD CELL COUNT: 4.21 10*12/L — ABNORMAL LOW (ref 4.50–5.90)
RED CELL DISTRIBUTION WIDTH: 13.8 % (ref 12.0–15.0)
WBC ADJUSTED: 12.4 10*9/L — ABNORMAL HIGH (ref 4.5–11.0)

## 2019-02-11 LAB — ALBUMIN: Albumin:MCnc:Pt:Ser/Plas:Qn:: 4.4

## 2019-02-11 LAB — COMPREHENSIVE METABOLIC PANEL
ALKALINE PHOSPHATASE: 77 U/L (ref 38–126)
ALT (SGPT): 13 U/L (ref <50)
AST (SGOT): 25 U/L (ref 19–55)
BILIRUBIN TOTAL: 1 mg/dL (ref 0.0–1.2)
BLOOD UREA NITROGEN: 23 mg/dL — ABNORMAL HIGH (ref 7–21)
BUN / CREAT RATIO: 26
CALCIUM: 9.7 mg/dL (ref 8.5–10.2)
CHLORIDE: 94 mmol/L — ABNORMAL LOW (ref 98–107)
CO2: 24 mmol/L (ref 22.0–30.0)
CREATININE: 0.9 mg/dL (ref 0.70–1.30)
EGFR CKD-EPI AA MALE: >90 mL/min/{1.73_m2} (ref >=60)
EGFR CKD-EPI NON-AA MALE: >90 mL/min/{1.73_m2} (ref >=60)
GLUCOSE RANDOM: 89 mg/dL (ref 70–179)
POTASSIUM: 5.1 mmol/L — ABNORMAL HIGH (ref 3.5–5.0)
PROTEIN TOTAL: 7.6 g/dL (ref 6.5–8.3)
SODIUM: 129 mmol/L — ABNORMAL LOW (ref 135–145)

## 2019-02-11 LAB — ERYTHROCYTE SEDIMENTATION RATE: Lab: 14

## 2019-02-11 LAB — MAGNESIUM: Magnesium:MCnc:Pt:Ser/Plas:Qn:: 2.1

## 2019-02-11 LAB — C-REACTIVE PROTEIN: C reactive protein:MCnc:Pt:Ser/Plas:Qn:: 9.5

## 2019-02-11 LAB — INR: Coagulation tissue factor induced.INR:RelTime:Pt:PPP:Qn:Coag: 1.05

## 2019-02-11 NOTE — Unmapped (Signed)
VSS, pt A+Ox4. Pt had complaints of pain and nausea through out shift, tx with PRN's, see MAR. Pt says he's ready to attempt to drink bowel prep and feels his nausea is more controlled. Pt slept through most of the night. No falls or acute injuries. Wife at bedside. Will CTM.     Problem: Adult Inpatient Plan of Care  Goal: Plan of Care Review  Outcome: Ongoing - Unchanged  Goal: Patient-Specific Goal (Individualization)  Outcome: Ongoing - Unchanged  Goal: Absence of Hospital-Acquired Illness or Injury  Outcome: Ongoing - Unchanged  Goal: Optimal Comfort and Wellbeing  Outcome: Ongoing - Unchanged  Goal: Readiness for Transition of Care  Outcome: Ongoing - Unchanged  Goal: Rounds/Family Conference  Outcome: Ongoing - Unchanged     Problem: Wound  Goal: Optimal Wound Healing  Outcome: Ongoing - Unchanged     Problem: Infection  Goal: Infection Symptom Resolution  Outcome: Ongoing - Unchanged

## 2019-02-11 NOTE — Unmapped (Deleted)
New Consult Note      Requesting Attending Physician:  Mauri Brooklyn, MD  Service Requesting Consult:  Med General Doristine Counter (MDU)  Service Providing Consult: SRG2  Consulting Attending: Dr. Epifania Gore    Assessment:  Patient is a 49 y.o. male with history of stricturing Crohn's (diagnosed 1990). He is s/p sbr x3 and most recently ileocolectomy 08/2016 with ileocolonic anastomosis (5-cm of ileum and 5-cm of colon resected). Presented to [facility] ED with syncope and abdominal pain found to have leukocytosis and small bowel wall thickening on CT. His last BM was reportedly 7-days ago.       Recommendations:   - Recommend non-operative management for partial bowel obstruction in setting of active Crohn's flare. Endoscopy when clinically ready to assess for stricture which may be amenable to non-operative intervention.   - Given patient is passing flatus with benign abdominal exam (non-distended and soft) and CT scan without significant bowel dilation, may trial bowel regimen in preparation for colonoscopy. If becomes distended or abdominal pain worsens please contact SRG-consult pager.    We will continue to follow.     History of Present Illness:   Patient is a 49 y.o. male with history of stricturing Crohn's (diagnosed 80). He is s/p sbr x3 and most recently ileocolectomy 08/2016 with ileocolonic anastomosis (5-cm of ileum and 5-cm of colon resected). Presented to [facility] ED with syncope and abdominal pain found to have leukocytosis and small bowel wall thickening on CT. His last BM was 7-days go. Imaging from OSH shows diffuse small bowel thickening with surrounding free fluid c/w enteritis.   He was started on IV steroids 1/12 with minimal improvement in pain but passing gas. GI hoping for return of bowel function to prep and scope. His most recent colonoscopy 03/2018 showed healthy mucosa at the site of anastomosis and able to be traversed. The small bowel proximal to the anastomosis appeared normal. No signs of inflammation..    He has a history of medical noncompliance. He has failed therapy with Remicade, 6-MP, Humira. Currently on Stelara      Past Medical History:   Diagnosis Date   ??? Anxiety    ??? Crohn's disease (CMS-HCC)     diagnosed in 1990   ??? GERD (gastroesophageal reflux disease)    ??? Hypertension 10/08/2016       Past Surgical History:   Procedure Laterality Date   ??? COLON SURGERY     ??? PR COLONOSCOPY FLX DX W/COLLJ SPEC WHEN PFRMD Left 11/10/2012    Procedure: COLONOSCOPY, FLEXIBLE, PROXIMAL TO SPLENIC FLEXURE; DIAGNOSTIC, W/WO COLLECTION SPECIMEN BY BRUSH OR WASH;  Surgeon: Malcolm Metro, MD;  Location: [facility];  Service: Gastroenterology   ??? PR COLONOSCOPY FLX DX W/COLLJ SPEC WHEN PFRMD N/A 07/21/2013    Procedure: COLONOSCOPY, FLEXIBLE, PROXIMAL TO SPLENIC FLEXURE; DIAGNOSTIC, W/WO COLLECTION SPECIMEN BY BRUSH OR WASH;  Surgeon: Gwen Pounds, MD;  Location: GI [facility]URES [facility];  Service: Gastroenterology   ??? PR COLONOSCOPY FLX DX W/COLLJ SPEC WHEN PFRMD N/A 08/09/2016    Procedure: COLONOSCOPY, FLEXIBLE, PROXIMAL TO SPLENIC FLEXURE; DIAGNOSTIC, W/WO COLLECTION SPECIMEN BY BRUSH OR WASH;  Surgeon: Janyth Pupa, MD;  Location: GI [facility]URES [facility];  Service: Gastroenterology   ??? PR COLONOSCOPY FLX DX W/COLLJ SPEC WHEN PFRMD N/A 04/06/2018    Procedure: COLONOSCOPY, FLEXIBLE, PROXIMAL TO SPLENIC FLEXURE; DIAGNOSTIC, W/WO COLLECTION SPECIMEN BY BRUSH OR WASH;  Surgeon: Zetta Bills, MD;  Location: GI PROCEDURES [facility] [facility];  Service: Gastroenterology   ???  PR COLONOSCOPY W/BIOPSY SINGLE/MULTIPLE  07/23/2012 Procedure: COLONOSCOPY, FLEXIBLE, PROXIMAL TO SPLENIC FLEXURE; WITH BIOPSY, SINGLE OR MULTIPLE;  Surgeon: Vickii Chafe, MD;  Location: [facility];  Service: Gastroenterology   ??? PR COLONOSCOPY W/BIOPSY SINGLE/MULTIPLE N/A 07/15/2014    Procedure: COLONOSCOPY, FLEXIBLE, PROXIMAL TO SPLENIC FLEXURE; WITH BIOPSY, SINGLE OR MULTIPLE;  Surgeon: Janyth Pupa, MD;  Location: [facility];  Service: Gastroenterology   ??? PR COLONOSCOPY W/BIOPSY SINGLE/MULTIPLE N/A 03/21/2017    Procedure: COLONOSCOPY, FLEXIBLE, PROXIMAL TO SPLENIC FLEXURE; WITH BIOPSY, SINGLE OR MULTIPLE;  Surgeon: Modena Nunnery, MD;  Location: [facility] PROCEDURES [place] [facility];  Service: Gastroenterology   ??? PR COLSC FLEXIBLE W/TRANSENDOSCOPIC BALLOON DILAT N/A 07/15/2014    Procedure: COLONOSCOPY, FLEXIBLE; WITH DILATION BY BALLOON, 1 OR MORE STRICTURES;  Surgeon: Janyth Pupa, MD;  [facility]: [facility];  Service: Gastroenterology   ??? PR REMVL COLON & TERM ILEUM W/ILEOCOLOSTOMY N/A 09/16/2016    Procedure: COLECTOMY, PARTIAL, WITH REMOVAL OF TERMINAL ILEUM WITH ILEOCOLOSTOMY;  Surgeon: Lady Gary, MD;  Location: MAIN OR [place];  Service: Gastrointestinal   ??? PR SHLDR ARTHROSCOP,PART ACROMIOPLAS Right 10/02/2018    Procedure: R16 ARTHROSCOPY, SHOULDER, SURGICAL; DECOMPRESS SUBACROMIAL SPACE W/PART ACROMIOPLASTY, Tamala Bari;  Surgeon: Gonzella Lex, MD;  Location: ASC OR [facility];  Service: Orthopedics   ??? PR SHLDR ARTHROSCOP,SURG,W/ROTAT CUFF REPR Right 10/02/2018    Procedure: ARTHROSCOPY, SHOULDER, SURGICAL; WITH ROTATOR CUFF REPAIR;  Surgeon: Gonzella Lex, MD;  Location: ASC OR [facility];  Service: Orthopedics   ??? PR UPPER GI ENDOSCOPY,BIOPSY N/A 07/23/2012    Procedure: UGI ENDOSCOPY; WITH BIOPSY, SINGLE OR MULTIPLE;  Surgeon: Vickii Chafe, MD;  [facility]: [facility];  Service: Gastroenterology ??? PR UPPER GI ENDOSCOPY,DIAGNOSIS N/A 11/10/2012    Procedure: UGI ENDO, INCLUDE ESOPHAGUS, STOMACH, & DUODENUM &/OR JEJUNUM; DX W/WO COLLECTION SPECIMN, BY BRUSH OR WASH;  Surgeon: Malcolm Metro, MD;  Location: GI [facility]URES [facility];  Service: Gastroenterology   ??? PR UPPER GI ENDOSCOPY,DIAGNOSIS N/A 07/21/2013    Procedure: UGI ENDO, INCLUDE ESOPHAGUS, STOMACH, & DUODENUM &/OR JEJUNUM; DX W/WO COLLECTION SPECIMN, BY BRUSH OR WASH;  Surgeon: Gwen Pounds, MD;  Location: GI [facility];  Service: Gastroenterology   ??? PR UPPER GI ENDOSCOPY,DIAGNOSIS N/A 07/15/2014    Procedure: UGI ENDO, INCLUDE ESOPHAGUS, STOMACH, & DUODENUM &/OR JEJUNUM; DX W/WO COLLECTION SPECIMN, BY BRUSH OR WASH;  Surgeon: Janyth Pupa, MD;  Location: GI [facility]EDURES [facility];  Service: Gastroenterology   ??? SPINE SURGERY         Medication:  Current Facility-Administered Medications   Medication Dose Route Frequency Provider Last Rate Last Admin   ??? acetaminophen (TYLENOL) solution 650 mg  650 mg Oral Q4H PRN De-Vaughn Sima Matas, MD       ??? busPIRone (BUSPAR) tablet 10 mg  10 mg Oral BID Waymon Amato, MD   10 mg at 02/11/19 2956   ??? dicyclomine (BENTYL) capsule 10 mg  10 mg Oral 4x Daily PRN De-Vaughn Sima Matas, MD   10 mg at 02/11/19 2130   ??? enoxaparin (LOVENOX) syringe 40 mg  40 mg Subcutaneous Q24H SCH Oludamilola A Aladesanmi, MD       ??? melatonin tablet 3 mg  3 mg Oral Nightly PRN De-Vaughn Sima Matas, MD   3 mg at 02/10/19 2149   ??? methylPREDNISolone sodium succinate (PF) (Solu-MEDROL) injection 20 mg  20 mg Intravenous Q8H SCH Waymon Amato, MD  20 mg at 02/11/19 0657   ??? MORPhine injection 2 mg  2 mg Intravenous Q6H PRN De-Vaughn Sima Matas, MD   2 mg at 02/09/19 1713   ??? OLANZapine (ZYPREXA) tablet 2.5 mg  2.5 mg Oral Nightly Oludamilola A Aladesanmi, MD       ??? OLANZapine (ZYPREXA) tablet 2.5 mg  2.5 mg Oral Once PRN Oludamilola A Aladesanmi, MD ??? ondansetron (ZOFRAN) injection 4 mg  4 mg Intravenous Q6H PRN Sallyanne Havers, MD   4 mg at 02/11/19 0233   ??? oxyCODONE (ROXICODONE) immediate release tablet 10 mg  10 mg Oral Q6H PRN De-Vaughn Sima Matas, MD   10 mg at 02/11/19 0852   ??? prochlorperazine (COMPAZINE) injection 5 mg  5 mg Intravenous Q8H PRN Sallyanne Havers, MD   5 mg at 02/10/19 0813   ??? senna (SENOKOT) tablet 1 tablet  1 tablet Oral BID Gracelyn Nurse, MD   1 tablet at 02/11/19 0852   ??? simethicone (MYLICON) chewable tablet 80 mg  80 mg Oral Q6H PRN Waymon Amato, MD   80 mg at 02/08/19 1809       Allergies   Allergen Reactions   ??? Hydrocodone-Acetaminophen Anxiety     Other reaction(s): Other (See Comments)   ??? Tramadol Itching and Anxiety     Other reaction(s): Other (See Comments)   ??? Dilaudid [Hydromorphone] Anxiety   ??? Morphine Itching   ??? Opioids - Morphine Analogues Itching       Social History:  Social History     Tobacco Use   ??? Smoking status: Former Smoker     Packs/day: 1.00     Years: 18.00     Pack years: 18.00     Types: Cigarettes     Start date: 08/27/2003     Quit date: 06/06/2017     Years since quitting: 1.6   ??? Smokeless tobacco: Never Used   ??? Tobacco comment: Pt smokes 1ppd, Pt is interested in tobacco cessation    Substance Use Topics   ??? Alcohol use: No   ??? Drug use: Not Currently     Types: Marijuana       Family History   Problem Relation Age of Onset   ??? Hyperlipidemia Father    ??? Cancer Father    ??? Cancer Maternal Aunt    ??? Stroke Mother    ??? No Known Problems Sister    ??? No Known Problems Brother    ??? No Known Problems Maternal Uncle    ??? No Known Problems Paternal Aunt    ??? No Known Problems Paternal Uncle    ??? No Known Problems Maternal Grandmother    ??? No Known Problems Maternal Grandfather    ??? No Known Problems Paternal Grandmother    ??? No Known Problems Paternal Grandfather    ??? Anesthesia problems Neg Hx    ??? Broken bones Neg Hx    ??? Clotting disorder Neg Hx    ??? Collagen disease Neg Hx ??? Diabetes Neg Hx    ??? Dislocations Neg Hx    ??? Fibromyalgia Neg Hx    ??? Gout Neg Hx    ??? Hemophilia Neg Hx    ??? Osteoporosis Neg Hx    ??? Rheumatologic disease Neg Hx    ??? Scoliosis Neg Hx    ??? Severe sprains Neg Hx    ??? Sickle cell anemia Neg Hx    ??? Spinal Compression Fracture Neg Hx  Review of Systems  10 systems were reviewed and are negative except as noted specifically in the HPI.    Objective:   BP 115/62  - Pulse 54  - Temp 36.5 ??C (Oral)  - Resp 16  - Ht 180.3 cm (5' 10.98)  - Wt 59.7 kg (131 lb 9.8 oz)  - SpO2 95%  - BMI 18.36 kg/m??     Intake/Output last 3 shifts:  I/O last 3 completed shifts:  In: 237 [P.O.:237]  Out: -     Physical Exam:  Pertinent physical findings include: Abdomen is soft, non-distended. Patient reports discomfort with     The remainder of the physical exam was  unremarkable.  Findings included:  Head: Normocephalic, symmetric facies, atraumatic.  Eyes: Pupils equal and round, sclera anicteric, conjuctiva not injected.  Ears: Overall appearance normal with no lesions, drainage, or masses.  Hearing is grossly normal. TMs not examined.  Nose: Nares grossly normal, no drainage.  Neck: Supple, symmetrical, trachea midline, no adenopathy, no nuchal rigidity, no stridor.  Lymph: Shotty or no nodes palp in posterior auricular, cervical, supraclavicular. Inguinal, axillary and popliteal not specifically examined.  Pulmonary: Normal breath sounds, no respiratory distress, no wheezing, no retractions.   Cardiovascular: Regular rate and rhythm, no murmur noted.  Musculoskeletal:  Normal gait. Symmetric extremities with grossly normal strength.  Good range of motion in all major joints. No tenderness to palpation or major deformities noted. No edema.  Skin:  No jaundice; no rashes, erythema, or lesions. Skin color, texture, turgor normal, although patient was not fully disrobed.    Most Recent Labs:  Lab Results   Component Value Date    WBC 12.4 (H) 02/11/2019    HGB 13.2 (L) 02/11/2019 HCT 39.4 (L) 02/11/2019    PLT 316 02/11/2019       Lab Results   Component Value Date    NA 129 (L) 02/11/2019    K 5.1 (H) 02/11/2019    CL 94 (L) 02/11/2019    CO2 24.0 02/11/2019    BUN 23 (H) 02/11/2019    CREATININE 0.90 02/11/2019    CALCIUM 9.7 02/11/2019    MG 2.1 02/11/2019    PHOS 3.0 10/23/2016       IMAGING:  Ecg 12 Lead    Result Date: 02/11/2019  NORMAL SINUS RHYTHM MODERATE VOLTAGE CRITERIA FOR LVH, MAY BE NORMAL VARIANT EARLY REPOLARIZATION PATTERN BORDERLINE ECG WHEN COMPARED WITH ECG OF 10-Feb-2019 08:45, NO SIGNIFICANT CHANGE WAS FOUND    Ecg 12 Lead    Result Date: 02/10/2019  SINUS BRADYCARDIA EARLY REPOLARIZATION PATTERN OTHERWISE NORMAL ECG WHEN COMPARED WITH ECG OF 08-Feb-2019 10:39, NO SIGNIFICANT CHANGE WAS FOUND    Ecg 12 Lead    Result Date: 02/08/2019  NORMAL SINUS RHYTHM MINIMAL VOLTAGE CRITERIA FOR LVH, MAY BE NORMAL VARIANT ( [employer] product ) BORDERLINE ECG WHEN COMPARED WITH ECG OF 21-Oct-2016 20:23, QRS DURATION HAS INCREASED Confirmed by Freeman Caldron ([id number]) on 02/08/2019 2:59:39 PM    Xr Abdomen 1 View    Result Date: 02/09/2019  EXAM: XR ABDOMEN 1 VIEW DATE: 02/08/2019 6:06 PM ACCESSION: [id number] UN DICTATED: 02/09/2019 7:47 AM INTERPRETATION LOCATION: [facility] CLINICAL INDICATION: 49 years old Male with ABDOMINAL PAIN  -  UNSPECIFIED SITE  COMPARISON: CT 02/07/2019 TECHNIQUE: Supine portable views of the abdomen FINDINGS: Lung bases are clear. Nonobstructive bowel gas pattern. Oral contrast transiting to the sigmoid colon. Scattered pelvic phleboliths. Unremarkable bones.     Nonobstructive bowel gas pattern.    Ct  Body Interpretation Of Outside Film Corky Sox)    Result Date: 02/08/2019 EXAM: CT BODY INTERPRETATION OF OUTSIDE FILM [facility]) DATE: 02/08/2019 3:34 AM ACCESSION: [id number] UN DICTATED: 02/08/2019 8:12 AM INTERPRETATION LOCATION: [facility] CLINICAL INDICATION: R10.9 - Abdominal pain, unspecified abdominal location - K50.919 - Crohn's disease with complication, unspecified gastrointestinal tract location (CMS - HCC)  COMPARISON: CT abdomen pelvis dated 12/20/2017. TECHNIQUE: A request was received from Gerri Lins M.D. to interpret a CT of the abdomen and pelvis performed at [facility] on 02/07/2019. Images consist of contrast-enhanced axial and coronal images. Images were loaded into the [facility] PACS. FINDINGS: LINES AND TUBES: None. LOWER THORAX: Minimal dependent atelectasis HEPATOBILIARY: 0.6 cm hypoattenuating hepatic lesion is too small to characterize on CT (2:32). The gallbladder is present and otherwise unremarkable. No biliary dilatation.  SPLEEN: Unremarkable. PANCREAS: Unremarkable. ADRENALS: Unremarkable. KIDNEYS/URETERS: Numerous subcentimeter hypoattenuating lesions are seen bilaterally, the largest of which measures 0.8 cm (2:35). These are too small to characterize on CT. There is a tiny wedge-shaped hypoenhancing area in the lower pole of the left kidney with no perinephric inflammatory stranding, suggestive of focal scarring. BLADDER: Unremarkable. PELVIC ORGANS: Unremarkable. GI TRACT: Sequela of ileocecectomy with right lower quadrant ileocolic anastomosis, intact. Diffuse small bowel wall thickening is seen with surrounding free fluid suggestive of active inflammation. PERITONEUM/RETROPERITONEUM AND MESENTERY: No free air. Small volume free fluid seen surrounding numerous small bowel loops as above. LYMPH NODES: No enlarged lymph nodes. VESSELS: Normal in caliber. BONES AND SOFT TISSUES: No aggressive osseous lesions. Tiny bilateral fat-containing inguinal hernias. Small fat-containing umbilical hernia. --Diffuse small bowel wall thickening is seen with surrounding free fluid consistent with enteritis. --Tiny wedge-shaped hypoenhancing area in the lower pole of the left kidney with no perinephric inflammatory stranding, nonspecific but suggestive of focal scarring.  Focal pyelonephritis and renal infarct are felt less likely given tiny nature of findings. PLEASE NOTE:  Our interpretation of studies performed at an outside institution is limited by factors including the absence of technical specifics of the image, undisclosed clinical information and the unavailability of the original interpretation.  Specialists at the institution that performed the study may have access to information not available to [place] that could make a difference in this interpretation.  We suggest that you obtain the original interpretation from the site where the study was performed.        This case was discussed with Dr. Gregary Signs Ebon Ketchum  General Surgery, pgy-2

## 2019-02-11 NOTE — Unmapped (Signed)
LUMINAL GASTROENTEROLOGY INPATIENT FOLLOW UP NOTE     Requesting Attending Physician:  Mauri Brooklyn, MD    Reason for Consult:  Rodney Bender is a 49 y.o. male seen in consultation at the request of Dr. Mauri Brooklyn, MD for abdominal pain concerning for Crohn's flare.     Interval History:  Patient on IV Solumedrol since 1/12. Patient reportedly passing more gas but has not yet had a bowel movement. Continues to ambulate in his room throughout the day. Patient reports not feeling any better today. States that his nausea is unchanged. Patient seen by GI surgery on 1/14 who recommend non-operative management for his partial bowel obstruction. Patient and wife updated in joint meeting with primary team and GI this afternoon.     ASSESSMENT / PLAN:   This is a??48 y.o.??male??with PMHx of??stricturing ileocolonic Crohn's (dx 1990), s/p ileocecal resection (x4, last in 2018) currently on Stelara, who presents with??new onset abdominal pain and syncope since 1/8 concerning for Crohn's flare.     Patient this morning reportedly able to pass more gas but has not had a bowel movement yet. He is understandingly frustrated with the situation. Patient is ok to attempt to drink the bowel prep slowly today with the hope that he begins having bowel movements and can have a colonoscopy tomorrow. However, would have a low threshold to stop the bowel prep if his abdominal pain worsens or he begins vomiting. Abdominal XR from 1/14 showing a nonobstructive bowel gas pattern again with a focal air-fluid loop of bowel right upper quadrant nonspecific and nonobstructive. Patient evaluated for GI surgery to see if there are any surgical options available for him since he is improving very slowly. GI surgery recommending non-operative management at this time given his abdominal exam is benign and CT scan without significant bowel dilation.     Recommendations:  - Continue IV Solumedrol 20 mg q8h - Ok to start Miralax bowel prep today. However, low threshold to stop if patient has severe abdominal pain and/or develops worsening nausea/vomiting.   - Clear liquid diet today. NPO at midnight for Colonoscopy on 1/15.     Thank you for this consult. The patient was Discussed with and seen by Dr. Shawnie Pons. We will continue to follow along. Please page luminal fellow on call with questions.     Shawna Orleans, MD  [facility]           Medications:  Current Facility-Administered Medications   Medication Dose Route Frequency Provider Last Rate Last Admin   ??? acetaminophen (TYLENOL) solution 650 mg  650 mg Oral Q4H PRN De-Vaughn Sima Matas, MD       ??? busPIRone (BUSPAR) tablet 10 mg  10 mg Oral BID Waymon Amato, MD   10 mg at 02/11/19 7829   ??? dicyclomine (BENTYL) capsule 10 mg  10 mg Oral 4x Daily PRN De-Vaughn Sima Matas, MD   10 mg at 02/11/19 5621   ??? enoxaparin (LOVENOX) syringe 40 mg  40 mg Subcutaneous Q24H SCH Waymon Amato, MD   Stopped at 02/11/19 1132   ??? melatonin tablet 3 mg  3 mg Oral Nightly PRN De-Vaughn Sima Matas, MD   3 mg at 02/10/19 2149   ??? methylPREDNISolone sodium succinate (PF) (Solu-MEDROL) injection 20 mg  20 mg Intravenous Q8H SCH Oludamilola A Aladesanmi, MD   20 mg at 02/11/19 1333   ??? MORPhine injection 2 mg  2 mg Intravenous Q6H PRN De-Vaughn Sima Matas,  MD   2 mg at 02/11/19 1343   ??? OLANZapine (ZYPREXA) tablet 2.5 mg  2.5 mg Oral Nightly Oludamilola A Aladesanmi, MD       ??? ondansetron (ZOFRAN) injection 4 mg  4 mg Intravenous Q6H PRN Sallyanne Havers, MD   4 mg at 02/11/19 0900   ??? oxyCODONE (ROXICODONE) immediate release tablet 10 mg  10 mg Oral Q6H PRN De-Vaughn Sima Matas, MD   10 mg at 02/11/19 0852   ??? polyethylene glycol (MIRALAX) packet 17 g  17 g Oral BID Oludamilola A Aladesanmi, MD       ??? prochlorperazine (COMPAZINE) injection 5 mg  5 mg Intravenous Q8H PRN Sallyanne Havers, MD   5 mg at 02/10/19 0813 ??? senna (SENOKOT) tablet 1 tablet  1 tablet Oral BID Gracelyn Nurse, MD   1 tablet at 02/11/19 0852   ??? simethicone (MYLICON) chewable tablet 80 mg  80 mg Oral Q6H PRN Waymon Amato, MD   80 mg at 02/08/19 1809       Vital Signs:  Temp:  [36.2 ??C-36.5 ??C] 36.2 ??C  Heart Rate:  [51-60] 60  Resp:  [16] 16  BP: (115-148)/(62-84) 148/84  MAP (mmHg):  [81-109] 109  SpO2:  [95 %-98 %] 98 %    Intake/Output last 3 shifts:  I/O last 3 completed shifts:  In: 237 [P.O.:237]  Out: -     Physical Exam:  Constitutional: NAD  HEENT: no sleral icterus  CV: Regular  Pulmonary: normal WOB  Abdomen: firm, non-distended, +diffusely tender to superficial palpation with guarding   Skin: No jaundice   Extremities: warm, no LE edema  Neuro: Alert, Oriented x 3, No focal deficits.    Diagnostic Studies:   Labs:  Recent Labs     02/10/19  0905 02/11/19  0547   WBC 11.2* 12.4*   HGB 13.8 13.2*   HCT 39.3* 39.4*   PLT 327 316     Recent Labs     02/10/19  0905 02/11/19  0547   NA 129* 129*   K 5.1* 5.1*   CL 92* 94*   BUN 20 23*   CREATININE 0.87 0.90   GLU 90 89     Recent Labs     02/10/19  0905 02/11/19  0547   PROT 8.0 7.6   ALBUMIN 4.7 4.4   AST 26 25   ALT 15 13   ALKPHOS 88 77   BILITOT 1.2 1.0     Recent Labs     02/10/19  0905 02/11/19  0547   INR 1.10 1.05       Imaging/Procedures:  XR Abdomen 2 Views 02/11/2019:   ??  IMPRESSION:  Nonobstructed bowel gas pattern with a focal air-fluid loop of bowel right upper quadrant nonspecific, nonobstructive.

## 2019-02-11 NOTE — Unmapped (Signed)
Internal Medicine ([facility]) Progress Note    Assessment & Plan:   Rodney Bender is a 49 y.o. 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    Principal Problem:    Abdominal pain  Active Problems:    Intestinal bypass or anastomosis status    Crohn's disease of both small and large intestine with other complication (CMS-HCC)    Chronic abdominal pain  Resolved Problems:    * No resolved hospital problems. *      Abdominal pain, syncope - concern for Crohn's flare: Admitted for 3 days of abdominal pain and syncopal episode in car. OSH CT a/p c/f bowel wall thickening. Differential diagnosis Crohn's flare versus gastroenteritis versus small bowel obstruction. Peritonitis unlikely given stability. TTE WNL and telemetry negative at 24 hours; syncope likely vasovagal in response to pain. Currently no bowel movements for multiple days, so C diff and infectious gastroenteritis unlikely. Pain improved. GI concerned for obstipation; on Solumedrol for c/f Crohn's flare - improved to flatus, but still no bowel movements. Goal for c-scope to re-stage Crohn's. Zyprexa trialed and now scheduled nightly for nausea (should also help with underlying anxiety). GI requested repeat KUB and GI Surg consult for additional options. GI Surg recommending non-operative management with aggressive bowel regimen given patient is now passing flatus and abd appears soft and non-distended.  --GI following, appreciate recs  --start Miralax BID   --Zyprexa at bedtime   --Bentyl QID PRN for cramping  --Compazine for nausea (Zofran trialed, patient reported not effective)  --solumedrol 20mg  q8h   --if BMs, start bowel prep for c-scope TM  --Tylenol for mild pain, oxy 10 q6h PRN for mod, IV morphine 2 q6h PRN for severe    Anxiety: Document outpatient history, takes Buspar 10 BID PRN (unclear why PRN).  - start Buspar 10 BID  - Zyprexa as above    Ileocolonic Crohn's disease: Followed by Dr. Elizebeth Brooking of [facility].  Diagnosed in 1990.  Status post 4 ileocolonic resection. Colonoscopy 04/06/2018 limited due to poor preparation with stool in the entire examined colon, however patient end-to-side ileocolonic anastomosis, characterized by healthy-appearing mucosa and neoterminal ileum was normal.  --GI as above  --Stelara q8w as outpatient    Daily Checklist:  Diet: Clear Liquid Diet  DVT PPx: Not Indicated - Padua Score <4  Electrolytes: Replete Potassium to >/= 3.6 and Magnesium to >/= 1.8  Code Status: Full Code  Dispo: Home    Subjective:   No acute events overnight. Still no BM.    Objective:   Temp:  [36.2 ??C-36.5 ??C] 36.2 ??C  Heart Rate:  [51-60] 60  Resp:  [16] 16  BP: (115-148)/(62-84) 148/84  SpO2:  [95 %-98 %] 98 %    Gen: alert, oriented, answers questions appropriately  HEENT: atraumatic, sclera anicteric, MMM. OP w/o erythema or exudate   Heart: RRR, S1, S2, no M/R/G, no chest wall tenderness  Lungs: CTAB, no crackles or wheezes, no use of accessory muscles  Abdomen: improved bowel sounds, soft, diffusely TTP, non-distended, no rebound/guarding  Extremities: no clubbing, cyanosis, or edema  Psych: Appropriate mood and affect    Labs/Studies: Labs and Studies from the last 24hrs per EMR and Reviewed

## 2019-02-11 NOTE — Unmapped (Signed)
AxO x 4. VSS, remained afebrile and free from falls. C/O pain and nausea. Received PRN oxycodone once and PRN zofran and compazine, and scheduled zyprexa. Pt resting with no complaints after zyprexa admin. Will continue to monitor.    Problem: Adult Inpatient Plan of Care  Goal: Plan of Care Review  Outcome: Ongoing - Unchanged  Goal: Patient-Specific Goal (Individualization)  Outcome: Ongoing - Unchanged  Goal: Absence of Hospital-Acquired Illness or Injury  Outcome: Ongoing - Unchanged  Goal: Optimal Comfort and Wellbeing  Outcome: Ongoing - Unchanged  Goal: Readiness for Transition of Care  Outcome: Ongoing - Unchanged  Goal: Rounds/Family Conference  Outcome: Ongoing - Unchanged     Problem: Wound  Goal: Optimal Wound Healing  Outcome: Ongoing - Unchanged     Problem: Infection  Goal: Infection Symptom Resolution  Outcome: Ongoing - Unchanged

## 2019-02-11 NOTE — Unmapped (Signed)
New Consult Note      Requesting Attending Physician:  Mauri Brooklyn, MD  Service Requesting Consult:  Med General Doristine Counter (MDU)  Service Providing Consult: SRG2  Consulting Attending: Dr. Epifania Gore    Assessment:  Patient is a 49 y.o. male with history of stricturing Crohn's (diagnosed 1990). He is s/p sbr x3 and most recently ileocolectomy 08/2016 with ileocolonic anastomosis (5-cm of ileum and 5-cm of colon resected). Presented to [facility] ED with syncope and abdominal pain found to have leukocytosis and small bowel wall thickening on CT. His last BM was reportedly 7-days ago.       Recommendations:   - Recommend non-operative management for partial bowel obstruction in setting of active Crohn's flare. Endoscopy when clinically ready to assess for stricture which may be amenable to non-operative intervention.   - Given patient is passing flatus with benign abdominal exam (non-distended and soft) and CT scan without significant bowel dilation, may trial bowel regimen in preparation for colonoscopy. If becomes distended or abdominal pain worsens please contact SRG-consult pager.    We will continue to follow.     History of Present Illness:   Patient is a 49 y.o. male with history of stricturing Crohn's (diagnosed 71). He is s/p sbr x3 and most recently ileocolectomy 08/2016 with ileocolonic anastomosis (5-cm of ileum and 5-cm of colon resected). Presented to [facility] ED with syncope and abdominal pain found to have leukocytosis and small bowel wall thickening on CT. His last BM was 7-days go. Imaging from OSH shows diffuse small bowel thickening with surrounding free fluid c/w enteritis.   He was started on IV steroids 1/12 with minimal improvement in pain but passing gas. GI hoping for return of bowel function to prep and scope. His most recent colonoscopy 03/2018 showed healthy mucosa at the site of anastomosis and able to be traversed. The small bowel proximal to the anastomosis appeared normal. No signs of inflammation..    He has a history of medical noncompliance. He has failed therapy with Remicade, 6-MP, Humira. Currently on Stelara      Past Medical History:   Diagnosis Date   ??? Anxiety    ??? Crohn's disease (CMS-HCC)     diagnosed in 1990   ??? GERD (gastroesophageal reflux disease)    ??? Hypertension 10/08/2016       Past Surgical History:   Procedure Laterality Date   ??? COLON SURGERY     ??? PR COLONOSCOPY FLX DX W/COLLJ SPEC WHEN PFRMD Left 11/10/2012    Procedure: COLONOSCOPY, FLEXIBLE, PROXIMAL TO SPLENIC FLEXURE; DIAGNOSTIC, W/WO COLLECTION SPECIMEN BY BRUSH OR WASH;  Surgeon: Malcolm Metro, MD;  Location: [facility];  Service: Gastroenterology   ??? PR COLONOSCOPY FLX DX W/COLLJ SPEC WHEN PFRMD N/A 07/21/2013    Procedure: COLONOSCOPY, FLEXIBLE, PROXIMAL TO SPLENIC FLEXURE; DIAGNOSTIC, W/WO COLLECTION SPECIMEN BY BRUSH OR WASH;  Surgeon: Gwen Pounds, MD;  Location: [facility];  Service: Gastroenterology   ??? PR COLONOSCOPY FLX DX W/COLLJ SPEC WHEN PFRMD N/A 08/09/2016    Procedure: COLONOSCOPY, FLEXIBLE, PROXIMAL TO SPLENIC FLEXURE; DIAGNOSTIC, W/WO COLLECTION SPECIMEN BY BRUSH OR WASH;  Surgeon: Janyth Pupa, MD;  Location: [facility];  Service: Gastroenterology   ??? PR COLONOSCOPY FLX DX W/COLLJ SPEC WHEN PFRMD N/A 04/06/2018    Procedure: COLONOSCOPY, FLEXIBLE, PROXIMAL TO SPLENIC FLEXURE; DIAGNOSTIC, W/WO COLLECTION SPECIMEN BY BRUSH OR WASH;  Surgeon: Zetta Bills, MD;  Location: [facility];  Service: Gastroenterology   ???  PR COLONOSCOPY W/BIOPSY SINGLE/MULTIPLE  07/23/2012 Procedure: COLONOSCOPY, FLEXIBLE, PROXIMAL TO SPLENIC FLEXURE; WITH BIOPSY, SINGLE OR MULTIPLE;  Surgeon: Vickii Chafe, MD;  Location: [facility];  Service: Gastroenterology   ??? PR COLONOSCOPY W/BIOPSY SINGLE/MULTIPLE N/A 07/15/2014    Procedure: COLONOSCOPY, FLEXIBLE, PROXIMAL TO SPLENIC FLEXURE; WITH BIOPSY, SINGLE OR MULTIPLE;  Surgeon: Janyth Pupa, MD;  Location: [facility];  Service: Gastroenterology   ??? PR COLONOSCOPY W/BIOPSY SINGLE/MULTIPLE N/A 03/21/2017    Procedure: COLONOSCOPY, FLEXIBLE, PROXIMAL TO SPLENIC FLEXURE; WITH BIOPSY, SINGLE OR MULTIPLE;  Surgeon: Modena Nunnery, MD;  [facility];  Service: Gastroenterology   ??? PR COLSC FLEXIBLE W/TRANSENDOSCOPIC BALLOON DILAT N/A 07/15/2014    Procedure: COLONOSCOPY, FLEXIBLE; WITH DILATION BY BALLOON, 1 OR MORE STRICTURES;  Surgeon: Janyth Pupa, MD;  Location: [facility]URES [facility];  Service: Gastroenterology   ??? PR REMVL COLON & TERM ILEUM W/ILEOCOLOSTOMY N/A 09/16/2016    Procedure: COLECTOMY, PARTIAL, WITH REMOVAL OF TERMINAL ILEUM WITH ILEOCOLOSTOMY;  Surgeon: Lady Gary, MD;  Location: MAIN OR [place];  Service: Gastrointestinal   ??? PR SHLDR ARTHROSCOP,PART ACROMIOPLAS Right 10/02/2018    Procedure: R16 ARTHROSCOPY, SHOULDER, SURGICAL; DECOMPRESS SUBACROMIAL SPACE W/PART ACROMIOPLASTY, Tamala Bari;  Surgeon: Gonzella Lex, MD;  Location: ASC [facility];  Service: Orthopedics   ??? PR SHLDR ARTHROSCOP,SURG,W/ROTAT CUFF REPR Right 10/02/2018    Procedure: ARTHROSCOPY, SHOULDER, SURGICAL; WITH ROTATOR CUFF REPAIR;  Surgeon: Gonzella Lex, MD;  Location: ASC OR [facility];  Service: Orthopedics   ??? PR UPPER GI ENDOSCOPY,BIOPSY N/A 07/23/2012    Procedure: UGI ENDOSCOPY; WITH BIOPSY, SINGLE OR MULTIPLE;  Surgeon: Vickii Chafe, MD;  Location: GI [facility]URES [facility];  Service: Gastroenterology ??? PR UPPER GI ENDOSCOPY,DIAGNOSIS N/A 11/10/2012    Procedure: UGI ENDO, INCLUDE ESOPHAGUS, STOMACH, & DUODENUM &/OR JEJUNUM; DX W/WO COLLECTION SPECIMN, BY BRUSH OR WASH;  Surgeon: Malcolm Metro, MD;  [facility]: [facility];  Service: Gastroenterology   ??? PR UPPER GI ENDOSCOPY,DIAGNOSIS N/A 07/21/2013    Procedure: UGI ENDO, INCLUDE ESOPHAGUS, STOMACH, & DUODENUM &/OR JEJUNUM; DX W/WO COLLECTION SPECIMN, BY BRUSH OR WASH;  Surgeon: Gwen Pounds, MD;  Location: [facility]URES [facility];  Service: Gastroenterology   ??? PR UPPER GI ENDOSCOPY,DIAGNOSIS N/A 07/15/2014    Procedure: UGI ENDO, INCLUDE ESOPHAGUS, STOMACH, & DUODENUM &/OR JEJUNUM; DX W/WO COLLECTION SPECIMN, BY BRUSH OR WASH;  Surgeon: Janyth Pupa, MD;  [facility]: [facility];  Service: Gastroenterology   ??? SPINE SURGERY         Medication:  Current Facility-Administered Medications   Medication Dose Route Frequency Provider Last Rate Last Admin   ??? acetaminophen (TYLENOL) solution 650 mg  650 mg Oral Q4H PRN De-Vaughn Sima Matas, MD       ??? busPIRone (BUSPAR) tablet 10 mg  10 mg Oral BID Waymon Amato, MD   10 mg at 02/11/19 1610   ??? dicyclomine (BENTYL) capsule 10 mg  10 mg Oral 4x Daily PRN De-Vaughn Sima Matas, MD   10 mg at 02/11/19 9604   ??? enoxaparin (LOVENOX) syringe 40 mg  40 mg Subcutaneous Q24H SCH Waymon Amato, MD   Stopped at 02/11/19 1132   ??? melatonin tablet 3 mg  3 mg Oral Nightly PRN De-Vaughn Sima Matas, MD   3 mg at 02/10/19 2149   ??? methylPREDNISolone sodium succinate (PF) (Solu-MEDROL) injection 20 mg  20 mg Intravenous Q8H SCH Oludamilola A Aladesanmi,  MD   20 mg at 02/11/19 0657   ??? MORPhine injection 2 mg  2 mg Intravenous Q6H PRN De-Vaughn Sima Matas, MD   2 mg at 02/11/19 1102   ??? OLANZapine (ZYPREXA) tablet 2.5 mg  2.5 mg Oral Nightly Oludamilola A Aladesanmi, MD       ??? OLANZapine (ZYPREXA) tablet 2.5 mg  2.5 mg Oral Once PRN Oludamilola A Aladesanmi, MD ??? ondansetron (ZOFRAN) injection 4 mg  4 mg Intravenous Q6H PRN Sallyanne Havers, MD   4 mg at 02/11/19 0900   ??? oxyCODONE (ROXICODONE) immediate release tablet 10 mg  10 mg Oral Q6H PRN De-Vaughn Sima Matas, MD   10 mg at 02/11/19 0852   ??? prochlorperazine (COMPAZINE) injection 5 mg  5 mg Intravenous Q8H PRN Sallyanne Havers, MD   5 mg at 02/10/19 0813   ??? senna (SENOKOT) tablet 1 tablet  1 tablet Oral BID Gracelyn Nurse, MD   1 tablet at 02/11/19 0852   ??? simethicone (MYLICON) chewable tablet 80 mg  80 mg Oral Q6H PRN Waymon Amato, MD   80 mg at 02/08/19 1809       Allergies   Allergen Reactions   ??? Hydrocodone-Acetaminophen Anxiety     Other reaction(s): Other (See Comments)   ??? Tramadol Itching and Anxiety     Other reaction(s): Other (See Comments)   ??? Dilaudid [Hydromorphone] Anxiety   ??? Morphine Itching   ??? Opioids - Morphine Analogues Itching       Social History:  Social History     Tobacco Use   ??? Smoking status: Former Smoker     Packs/day: 1.00     Years: 18.00     Pack years: 18.00     Types: Cigarettes     Start date: 08/27/2003     Quit date: 06/06/2017     Years since quitting: 1.6   ??? Smokeless tobacco: Never Used   ??? Tobacco comment: Pt smokes 1ppd, Pt is interested in tobacco cessation    Substance Use Topics   ??? Alcohol use: No   ??? Drug use: Not Currently     Types: Marijuana       Family History   Problem Relation Age of Onset   ??? Hyperlipidemia Father    ??? Cancer Father    ??? Cancer Maternal Aunt    ??? Stroke Mother    ??? No Known Problems Sister    ??? No Known Problems Brother    ??? No Known Problems Maternal Uncle    ??? No Known Problems Paternal Aunt    ??? No Known Problems Paternal Uncle    ??? No Known Problems Maternal Grandmother    ??? No Known Problems Maternal Grandfather    ??? No Known Problems Paternal Grandmother    ??? No Known Problems Paternal Grandfather    ??? Anesthesia problems Neg Hx    ??? Broken bones Neg Hx    ??? Clotting disorder Neg Hx    ??? Collagen disease Neg Hx ??? Diabetes Neg Hx    ??? Dislocations Neg Hx    ??? Fibromyalgia Neg Hx    ??? Gout Neg Hx    ??? Hemophilia Neg Hx    ??? Osteoporosis Neg Hx    ??? Rheumatologic disease Neg Hx    ??? Scoliosis Neg Hx    ??? Severe sprains Neg Hx    ??? Sickle cell anemia Neg Hx    ??? Spinal Compression Fracture  Neg Hx        Review of Systems  10 systems were reviewed and are negative except as noted specifically in the HPI.    Objective:   BP 115/62  - Pulse 54  - Temp 36.5 ??C (Oral)  - Resp 16  - Ht 180.3 cm (5' 10.98)  - Wt 59.7 kg (131 lb 9.8 oz)  - SpO2 95%  - BMI 18.36 kg/m??     Intake/Output last 3 shifts:  I/O last 3 completed shifts:  In: 237 [P.O.:237]  Out: -     Physical Exam:  Pertinent physical findings include: Abdomen is soft, non-distended. Patient reports discomfort with     The remainder of the physical exam was  unremarkable.  Findings included:  Head: Normocephalic, symmetric facies, atraumatic.  Eyes: Pupils equal and round, sclera anicteric, conjuctiva not injected.  Ears: Overall appearance normal with no lesions, drainage, or masses.  Hearing is grossly normal. TMs not examined.  Nose: Nares grossly normal, no drainage.  Neck: Supple, symmetrical, trachea midline, no adenopathy, no nuchal rigidity, no stridor.  Lymph: Shotty or no nodes palp in posterior auricular, cervical, supraclavicular. Inguinal, axillary and popliteal not specifically examined.  Pulmonary: Normal breath sounds, no respiratory distress, no wheezing, no retractions.   Cardiovascular: Regular rate and rhythm, no murmur noted.  Musculoskeletal:  Normal gait. Symmetric extremities with grossly normal strength.  Good range of motion in all major joints. No tenderness to palpation or major deformities noted. No edema.  Skin:  No jaundice; no rashes, erythema, or lesions. Skin color, texture, turgor normal, although patient was not fully disrobed.    Most Recent Labs:  Lab Results   Component Value Date    WBC 12.4 (H) 02/11/2019    HGB 13.2 (L) 02/11/2019 HCT 39.4 (L) 02/11/2019    PLT 316 02/11/2019       Lab Results   Component Value Date    NA 129 (L) 02/11/2019    K 5.1 (H) 02/11/2019    CL 94 (L) 02/11/2019    CO2 24.0 02/11/2019    BUN 23 (H) 02/11/2019    CREATININE 0.90 02/11/2019    CALCIUM 9.7 02/11/2019    MG 2.1 02/11/2019    PHOS 3.0 10/23/2016       IMAGING:  Ecg 12 Lead    Result Date: 02/11/2019  NORMAL SINUS RHYTHM MODERATE VOLTAGE CRITERIA FOR LVH, MAY BE NORMAL VARIANT EARLY REPOLARIZATION PATTERN BORDERLINE ECG WHEN COMPARED WITH ECG OF 10-Feb-2019 08:45, NO SIGNIFICANT CHANGE WAS FOUND    Ecg 12 Lead    Result Date: 02/10/2019  SINUS BRADYCARDIA EARLY REPOLARIZATION PATTERN OTHERWISE NORMAL ECG WHEN COMPARED WITH ECG OF 08-Feb-2019 10:39, NO SIGNIFICANT CHANGE WAS FOUND    Ecg 12 Lead    Result Date: 02/08/2019  NORMAL SINUS RHYTHM MINIMAL VOLTAGE CRITERIA FOR LVH, MAY BE NORMAL VARIANT ( [employer] product ) BORDERLINE ECG WHEN COMPARED WITH ECG OF 21-Oct-2016 20:23, QRS DURATION HAS INCREASED Confirmed by Freeman Caldron ([id number]) on 02/08/2019 2:59:39 PM    Xr Abdomen 1 View    Result Date: 02/09/2019  EXAM: XR ABDOMEN 1 VIEW DATE: 02/08/2019 6:06 PM ACCESSION: [id number] UN DICTATED: 02/09/2019 7:47 AM INTERPRETATION LOCATION: [facility] CLINICAL INDICATION: 49 years old Male with ABDOMINAL PAIN  -  UNSPECIFIED SITE  COMPARISON: CT 02/07/2019 TECHNIQUE: Supine portable views of the abdomen FINDINGS: Lung bases are clear. Nonobstructive bowel gas pattern. Oral contrast transiting to the sigmoid colon. Scattered pelvic phleboliths. Unremarkable bones.  Nonobstructive bowel gas pattern.    Ct Body Interpretation Of Outside Film Corky Sox)    Result Date: 02/08/2019 EXAM: CT BODY INTERPRETATION OF OUTSIDE FILM [facility]) DATE: 02/08/2019 3:34 AM ACCESSION: [id number] UN DICTATED: 02/08/2019 8:12 AM INTERPRETATION LOCATION: [facility] CLINICAL INDICATION: R10.9 - Abdominal pain, unspecified abdominal location - K50.919 - Crohn's disease with complication, unspecified gastrointestinal tract location (CMS - HCC)  COMPARISON: CT abdomen pelvis dated 12/20/2017. TECHNIQUE: A request was received from Gerri Lins M.D. to interpret a CT of the abdomen and pelvis performed at [facility] on 02/07/2019. Images consist of contrast-enhanced axial and coronal images. Images were loaded into the [facility] PACS. FINDINGS: LINES AND TUBES: None. LOWER THORAX: Minimal dependent atelectasis HEPATOBILIARY: 0.6 cm hypoattenuating hepatic lesion is too small to characterize on CT (2:32). The gallbladder is present and otherwise unremarkable. No biliary dilatation.  SPLEEN: Unremarkable. PANCREAS: Unremarkable. ADRENALS: Unremarkable. KIDNEYS/URETERS: Numerous subcentimeter hypoattenuating lesions are seen bilaterally, the largest of which measures 0.8 cm (2:35). These are too small to characterize on CT. There is a tiny wedge-shaped hypoenhancing area in the lower pole of the left kidney with no perinephric inflammatory stranding, suggestive of focal scarring. BLADDER: Unremarkable. PELVIC ORGANS: Unremarkable. GI TRACT: Sequela of ileocecectomy with right lower quadrant ileocolic anastomosis, intact. Diffuse small bowel wall thickening is seen with surrounding free fluid suggestive of active inflammation. PERITONEUM/RETROPERITONEUM AND MESENTERY: No free air. Small volume free fluid seen surrounding numerous small bowel loops as above. LYMPH NODES: No enlarged lymph nodes. VESSELS: Normal in caliber. BONES AND SOFT TISSUES: No aggressive osseous lesions. Tiny bilateral fat-containing inguinal hernias. Small fat-containing umbilical hernia. --Diffuse small bowel wall thickening is seen with surrounding free fluid consistent with enteritis. --Tiny wedge-shaped hypoenhancing area in the lower pole of the left kidney with no perinephric inflammatory stranding, nonspecific but suggestive of focal scarring.  Focal pyelonephritis and renal infarct are felt less likely given tiny nature of findings. PLEASE NOTE:  Our interpretation of studies performed at an outside institution is limited by factors including the absence of technical specifics of the image, undisclosed clinical information and the unavailability of the original interpretation.  Specialists at the institution that performed the study may have access to information not available to [place] that could make a difference in this interpretation.  We suggest that you obtain the original interpretation from the site where the study was performed.        This case was discussed with Dr. Gregary Signs Tashan Kreitzer  General Surgery, pgy-2

## 2019-02-12 LAB — COMPREHENSIVE METABOLIC PANEL
ALBUMIN: 4.6 g/dL (ref 3.5–5.0)
ALKALINE PHOSPHATASE: 67 U/L (ref 38–126)
ALT (SGPT): 14 U/L (ref <50)
AST (SGOT): 29 U/L (ref 19–55)
BILIRUBIN TOTAL: 0.8 mg/dL (ref 0.0–1.2)
BLOOD UREA NITROGEN: 15 mg/dL (ref 7–21)
BUN / CREAT RATIO: 19
CALCIUM: 9.6 mg/dL (ref 8.5–10.2)
CHLORIDE: 93 mmol/L — ABNORMAL LOW (ref 98–107)
CO2: 21 mmol/L — ABNORMAL LOW (ref 22.0–30.0)
CREATININE: 0.79 mg/dL (ref 0.70–1.30)
EGFR CKD-EPI AA MALE: >90 mL/min/{1.73_m2} (ref >=60)
EGFR CKD-EPI NON-AA MALE: >90 mL/min/{1.73_m2} (ref >=60)
GLUCOSE RANDOM: 125 mg/dL (ref 70–179)
POTASSIUM: 4.2 mmol/L (ref 3.5–5.0)
PROTEIN TOTAL: 8 g/dL (ref 6.5–8.3)
SODIUM: 128 mmol/L — ABNORMAL LOW (ref 135–145)

## 2019-02-12 LAB — CBC W/ AUTO DIFF
BASOPHILS ABSOLUTE COUNT: 0 10*9/L (ref 0.0–0.1)
BASOPHILS RELATIVE PERCENT: 0 %
EOSINOPHILS ABSOLUTE COUNT: 0 10*9/L (ref 0.0–0.4)
EOSINOPHILS RELATIVE PERCENT: 0.1 %
HEMATOCRIT: 39.3 % — ABNORMAL LOW (ref 41.0–53.0)
HEMOGLOBIN: 13.2 g/dL — ABNORMAL LOW (ref 13.5–17.5)
LARGE UNSTAINED CELLS: 1 % (ref 0–4)
LYMPHOCYTES ABSOLUTE COUNT: 0.8 10*9/L — ABNORMAL LOW (ref 1.5–5.0)
LYMPHOCYTES RELATIVE PERCENT: 5.2 %
MEAN CORPUSCULAR HEMOGLOBIN CONC: 33.7 g/dL (ref 31.0–37.0)
MEAN PLATELET VOLUME: 7.9 fL (ref 7.0–10.0)
MONOCYTES ABSOLUTE COUNT: 0.9 10*9/L — ABNORMAL HIGH (ref 0.2–0.8)
MONOCYTES RELATIVE PERCENT: 5.9 %
NEUTROPHILS ABSOLUTE COUNT: 12.9 10*9/L — ABNORMAL HIGH (ref 2.0–7.5)
NEUTROPHILS RELATIVE PERCENT: 88.1 %
PLATELET COUNT: 374 10*9/L (ref 150–440)
RED BLOOD CELL COUNT: 4.21 10*12/L — ABNORMAL LOW (ref 4.50–5.90)
RED CELL DISTRIBUTION WIDTH: 13.9 % (ref 12.0–15.0)
WBC ADJUSTED: 14.6 10*9/L — ABNORMAL HIGH (ref 4.5–11.0)

## 2019-02-12 LAB — MAGNESIUM: Magnesium:MCnc:Pt:Ser/Plas:Qn:: 1.9

## 2019-02-12 LAB — LARGE UNSTAINED CELLS: Lab: 1

## 2019-02-12 LAB — PROTIME: Coagulation tissue factor induced:Time:Pt:PPP:Qn:Coag: 13

## 2019-02-12 LAB — PROTIME-INR: PROTIME: 13 s (ref 10.5–13.5)

## 2019-02-12 LAB — ALT (SGPT): Alanine aminotransferase:CCnc:Pt:Ser/Plas:Qn:: 14

## 2019-02-12 LAB — ERYTHROCYTE SEDIMENTATION RATE: Lab: 15

## 2019-02-12 LAB — C-REACTIVE PROTEIN: C reactive protein:MCnc:Pt:Ser/Plas:Qn:: 6.6

## 2019-02-12 NOTE — Unmapped (Signed)
VSS, pt free from injury/falls, AOx4. No bowel movement at the time of writing, pt has begun miralax bowel prep for colonoscopy tomorrow. Continuous abdominal pain/cramping with nausea which waxes and wanes, generally well-controlled on current regimen. WCTM.

## 2019-02-12 NOTE — Unmapped (Addendum)
Pt A&O x4, stable VS, and afebrile. Pt continue on initial bowel prep order of 2L gatorade with 10 packets of Miralax. At 0400, attempted to start 2nd bowel prep regimen of 2L gatorade with 10 packets of Miralax. Pt refused 2nd regimen. He said the day nurse and doctors had told him he only had to drink a total of 2L & 10 packets. I paged the Med U MD who confirmed 2nd regimen was ordered. Updated pt, the continued to refused. Paged Med U MD again. MD called pt.    ** 0700 addendum**  At around 0600, pt started taking 2nd bowel regimen order  Pt more compliant with plans after speaking with MED U MD.  Jarold Motto   Problem: Adult Inpatient Plan of Care  Goal: Plan of Care Review  Outcome: Ongoing - Unchanged  Goal: Patient-Specific Goal (Individualization)  Outcome: Ongoing - Unchanged  Goal: Absence of Hospital-Acquired Illness or Injury  Outcome: Ongoing - Unchanged  Goal: Optimal Comfort and Wellbeing  Outcome: Ongoing - Unchanged  Goal: Readiness for Transition of Care  Outcome: Ongoing - Unchanged  Goal: Rounds/Family Conference  Outcome: Ongoing - Unchanged     Problem: Wound  Goal: Optimal Wound Healing  Outcome: Ongoing - Unchanged     Problem: Infection  Goal: Infection Symptom Resolution  Outcome: Ongoing - Unchanged

## 2019-02-12 NOTE — Unmapped (Signed)
We will follow-up GI findings and provide additional recommendations 02/13/2019.     Thank you,  Pamelyn Bancroft

## 2019-02-12 NOTE — Unmapped (Signed)
LUMINAL GASTROENTEROLOGY INPATIENT FOLLOW UP NOTE     Requesting Attending Physician:  Mauri Brooklyn, MD    Reason for Consult:  Rodney Bender is a 49 y.o. male seen in consultation at the request of Dr. Mauri Brooklyn, MD for abdominal pain concerning for Crohn's flare.     Interval History:  Patient was able to drink 2 L of Gatorade with 10 packets of the Miralax in preparation for a colonoscopy today. Patient agreeable after some coaching from overnight resident to begin a second round of bowel prep since he has still not had any bowel movement. This morning notified that patient has had bowel movements, which are now clear. Patient reports feeling much better bowel movements but that he did have abdominal pain. Patient eager to get colonoscopy done this afternoon.     ASSESSMENT / PLAN:   This is a??48 y.o.??male??with PMHx of??stricturing ileocolonic Crohn's (dx 1990), s/p ileocecal resection (x4, last in 2018) currently on Stelara, who presents with??new onset abdominal pain and syncope since 1/8??concerning for Crohn's flare.     Patient's presentation concerning for obstruction given the limited PO intake, nausea, and obstipation on presentation. Patient's history clinically fits an adhesion causing an obstruction. However, he would be significantly worse or have improved by now. He could also have significant luminal inflammation causing an obstruction. Therefore, patient started on IV Solumedrol 20 mg q8h on 1/13 for likely Crohn's flare and has been passing gas since. Patient wanting to try to attempt to drink bowel prep yesterday with the hope that he will begin having bowel movements and can have a colonoscopy today to re-stage his Crohn's disease. He was able to tolerate the prep and is having bowel movements as of this morning. Plan is to perform a colonoscopy today to restage his Crohn's disease.     Recommendations:  - Continue IV Solumedrol q8h  - Continue DVT ppx - Colonoscopy on 1/15 for restaging of Crohn's disease.   - Will provide further recs pending colonoscopy findings.     Thank you for this consult. The patient was Discussed with and seen by Dr. Shawnie Pons. We will continue to follow along. Please page luminal fellow on call with questions.     Shawna Orleans, MD   [facility] [employer] [facility]           Medications:  Current Facility-Administered Medications   Medication Dose Route Frequency Provider Last Rate Last Admin   ??? acetaminophen (TYLENOL) solution 650 mg  650 mg Oral Q4H PRN De-Vaughn Sima Matas, MD       ??? busPIRone (BUSPAR) tablet 10 mg  10 mg Oral BID Waymon Amato, MD   10 mg at 02/12/19 0835   ??? dicyclomine (BENTYL) capsule 10 mg  10 mg Oral 4x Daily PRN De-Vaughn Sima Matas, MD   10 mg at 02/11/19 9604   ??? enoxaparin (LOVENOX) syringe 40 mg  40 mg Subcutaneous Q24H SCH Waymon Amato, MD   Stopped at 02/11/19 1132   ??? melatonin tablet 3 mg  3 mg Oral Nightly PRN De-Vaughn Sima Matas, MD   3 mg at 02/11/19 2154   ??? methylPREDNISolone sodium succinate (PF) (Solu-MEDROL) injection 20 mg  20 mg Intravenous Q8H SCH Oludamilola A Aladesanmi, MD   20 mg at 02/12/19 0600   ??? MORPhine injection 2 mg  2 mg Intravenous Q6H PRN De-Vaughn Sima Matas, MD   2 mg at 02/12/19 0846   ??? OLANZapine (ZYPREXA) tablet 2.5 mg  2.5 mg Oral Nightly Oludamilola A Aladesanmi, MD   2.5 mg at 02/11/19 2155   ??? ondansetron (ZOFRAN) injection 4 mg  4 mg Intravenous Q6H PRN Sallyanne Havers, MD   4 mg at 02/12/19 0341   ??? oxyCODONE (ROXICODONE) immediate release tablet 10 mg  10 mg Oral Q6H PRN De-Vaughn Sima Matas, MD   10 mg at 02/12/19 0342   ??? prochlorperazine (COMPAZINE) injection 5 mg  5 mg Intravenous Q8H PRN Sallyanne Havers, MD   5 mg at 02/12/19 2956   ??? senna (SENOKOT) tablet 1 tablet  1 tablet Oral BID Gracelyn Nurse, MD   1 tablet at 02/12/19 0835 ??? simethicone (MYLICON) chewable tablet 80 mg  80 mg Oral Q6H PRN Waymon Amato, MD   80 mg at 02/08/19 1809       Vital Signs:  Temp:  [36.2 ??C-36.9 ??C] 36.2 ??C  Heart Rate:  [59-60] 59  Resp:  [16] 16  BP: (125-153)/(78-88) 153/88  MAP (mmHg):  [109] 109  SpO2:  [96 %-98 %] 96 %    Intake/Output last 3 shifts:  No intake/output data recorded.    Physical Exam:  Constitutional: NAD  HEENT: no sleral icterus  CV: Regular  Pulmonary: normal WOB  Abdomen: soft, non-distended, +diffusely tender to palpation   Skin: No jaundice   Extremities: warm, no LE edema  Neuro: Alert, Oriented x 3, No focal deficits.    Diagnostic Studies:   Labs:  Recent Labs     02/10/19  0905 02/11/19  0547 02/12/19  0703   WBC 11.2* 12.4* 14.6*   HGB 13.8 13.2* 13.2*   HCT 39.3* 39.4* 39.3*   PLT 327 316 374     Recent Labs     02/10/19  0905 02/11/19  0547 02/12/19  0703   NA 129* 129* 128*   K 5.1* 5.1* 4.2   CL 92* 94* 93*   BUN 20 23* 15   CREATININE 0.87 0.90 0.79   GLU 90 89 125     Recent Labs     02/10/19  0905 02/11/19  0547 02/12/19  0703   PROT 8.0 7.6 8.0   ALBUMIN 4.7 4.4 4.6   AST 26 25 29    ALT 15 13 14    ALKPHOS 88 77 67   BILITOT 1.2 1.0 0.8     Recent Labs     02/10/19  0905 02/11/19  0547 02/12/19  0703   INR 1.10 1.05 1.10       Imaging/Procedures:  No new imaging to review.

## 2019-02-13 LAB — COMPREHENSIVE METABOLIC PANEL
ALBUMIN: 4.3 g/dL (ref 3.5–5.0)
ALT (SGPT): 12 U/L (ref <50)
ANION GAP: 10 mmol/L (ref 7–15)
AST (SGOT): 22 U/L (ref 19–55)
BILIRUBIN TOTAL: 0.7 mg/dL (ref 0.0–1.2)
BLOOD UREA NITROGEN: 15 mg/dL (ref 7–21)
CALCIUM: 9.4 mg/dL (ref 8.5–10.2)
CHLORIDE: 92 mmol/L — ABNORMAL LOW (ref 98–107)
CO2: 27 mmol/L (ref 22.0–30.0)
CREATININE: 0.91 mg/dL (ref 0.70–1.30)
EGFR CKD-EPI AA MALE: >90 mL/min/{1.73_m2} (ref >=60)
EGFR CKD-EPI NON-AA MALE: >90 mL/min/{1.73_m2} (ref >=60)
GLUCOSE RANDOM: 80 mg/dL (ref 70–179)
POTASSIUM: 4.4 mmol/L (ref 3.5–5.0)
PROTEIN TOTAL: 7.3 g/dL (ref 6.5–8.3)
SODIUM: 129 mmol/L — ABNORMAL LOW (ref 135–145)

## 2019-02-13 LAB — C-REACTIVE PROTEIN: C reactive protein:MCnc:Pt:Ser/Plas:Qn:: 5.7

## 2019-02-13 LAB — CBC W/ AUTO DIFF
BASOPHILS ABSOLUTE COUNT: 0 10*9/L (ref 0.0–0.1)
EOSINOPHILS ABSOLUTE COUNT: 0.1 10*9/L (ref 0.0–0.4)
EOSINOPHILS RELATIVE PERCENT: 0.7 %
HEMATOCRIT: 40.1 % — ABNORMAL LOW (ref 41.0–53.0)
HEMOGLOBIN: 13.5 g/dL (ref 13.5–17.5)
LARGE UNSTAINED CELLS: 2 % (ref 0–4)
LYMPHOCYTES ABSOLUTE COUNT: 3.3 10*9/L (ref 1.5–5.0)
MEAN CORPUSCULAR HEMOGLOBIN CONC: 33.6 g/dL (ref 31.0–37.0)
MEAN PLATELET VOLUME: 8.2 fL (ref 7.0–10.0)
MONOCYTES ABSOLUTE COUNT: 0.9 10*9/L — ABNORMAL HIGH (ref 0.2–0.8)
MONOCYTES RELATIVE PERCENT: 6.9 %
NEUTROPHILS ABSOLUTE COUNT: 7.8 10*9/L — ABNORMAL HIGH (ref 2.0–7.5)
NEUTROPHILS RELATIVE PERCENT: 63.8 %
PLATELET COUNT: 331 10*9/L (ref 150–440)
RED BLOOD CELL COUNT: 4.26 10*12/L — ABNORMAL LOW (ref 4.50–5.90)
RED CELL DISTRIBUTION WIDTH: 13.8 % (ref 12.0–15.0)
WBC ADJUSTED: 12.3 10*9/L — ABNORMAL HIGH (ref 4.5–11.0)

## 2019-02-13 LAB — BASOPHILS ABSOLUTE COUNT: Basophils:NCnc:Pt:Bld:Qn:Automated count: 0

## 2019-02-13 LAB — MAGNESIUM: Magnesium:MCnc:Pt:Ser/Plas:Qn:: 2

## 2019-02-13 LAB — ERYTHROCYTE SEDIMENTATION RATE: Lab: 7

## 2019-02-13 LAB — INR: Coagulation tissue factor induced.INR:RelTime:Pt:PPP:Qn:Coag: 1.04

## 2019-02-13 LAB — PROTIME-INR: PROTIME: 12.3 s (ref 10.5–13.5)

## 2019-02-13 LAB — BILIRUBIN TOTAL: Bilirubin:MCnc:Pt:Ser/Plas:Qn:: 0.7

## 2019-02-13 MED ORDER — DICYCLOMINE 10 MG CAPSULE
ORAL_CAPSULE | Freq: Four times a day (QID) | ORAL | 0 refills | 8.00000 days | Status: CP | PRN
Start: 2019-02-13 — End: 2019-03-15

## 2019-02-13 MED ORDER — ACETAMINOPHEN 325 MG TABLET
Freq: Four times a day (QID) | ORAL | 0 refills | 0.00000 days | PRN
Start: 2019-02-13 — End: ?

## 2019-02-13 MED ORDER — PROCHLORPERAZINE MALEATE 5 MG TABLET
ORAL_TABLET | Freq: Three times a day (TID) | ORAL | 0 refills | 10 days | Status: CP | PRN
Start: 2019-02-13 — End: 2019-02-20

## 2019-02-13 MED ORDER — OLANZAPINE 2.5 MG TABLET
ORAL_TABLET | Freq: Every evening | ORAL | 0 refills | 30.00000 days | Status: CP
Start: 2019-02-13 — End: 2019-03-15

## 2019-02-13 NOTE — Unmapped (Signed)
GASTROENTEROLOGY TREATMENT PLAN NOTE     Requesting Attending Physician :  Mauri Brooklyn, MD    Reason for Consult:    Rodney Bender is a 49 y.o. male seen in consultation at the request of Dr. Mauri Brooklyn, MD for Crohns disease.    ASSESSMENT & RECOMMENDATIONS    49 y.o.??male??with PMHx of??stricturing ileocolonic Crohn's (dx 1990), s/p ileocecal resection (x4, last in 2018) currently on Stelara, who presents with??new onset abdominal pain and syncope since 1/8??concerning for Crohn's flare.??Treated with IV steroids from 1/13-1/15 with some clinical improvement and ability to prep for colonoscopy.  Colonoscopy 1/15 demonstrated prior end to side ileocolonic anastomosis that was patent and characterized by healthy mucosa. ??Few, small apthae notes in neoterminal ileum, otherwise normal mucosa throughout and appeared similar to prior colonoscopy.  His steroids were discontinued and recommend continued outpatient Stelara therapy.  Etiology of sudden onset abdominal pain and imaging changes remains unclear, however not due to active Crohns disease.    ??  At this time, recommend discontinuation of steroids and would continue on outpatient Stelara therapy. 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  Recommend daily miralax and avoidance of narcotics.  He was able to tolerate a soft diet overnight.  ??  Recommendations:  - f/u path  - continue miralax 17g daily  - avoid narcotics  -??continue DVT ppx??  - advance diet as tolerated  - we will arrange outpatient follow up with Dr. Elizebeth Brooking      Thank you for including [place] in the care of your patient. Patient discussed with Dr. Shawnie Pons.  Please page the GI Luminal consult pager at [phone] with any questions.    Jackelyn Hoehn, MD  Gastroenterology and Hepatology Fellow, PGY-6  [employer], [place]

## 2019-02-13 NOTE — Unmapped (Signed)
Patient alert and oriented, VSS. Pain and nausea meds given overnight PRN. No acute events, wctm.         Problem: Adult Inpatient Plan of Care  Goal: Plan of Care Review  Outcome: Ongoing - Unchanged  Goal: Patient-Specific Goal (Individualization)  Outcome: Ongoing - Unchanged  Goal: Absence of Hospital-Acquired Illness or Injury  Outcome: Ongoing - Unchanged  Goal: Optimal Comfort and Wellbeing  Outcome: Ongoing - Unchanged  Goal: Readiness for Transition of Care  Outcome: Ongoing - Unchanged  Goal: Rounds/Family Conference  Outcome: Ongoing - Unchanged     Problem: Wound  Goal: Optimal Wound Healing  Outcome: Ongoing - Unchanged     Problem: Infection  Goal: Infection Symptom Resolution  Outcome: Ongoing - Unchanged

## 2019-02-13 NOTE — Unmapped (Signed)
Pt alert and oriented x4. Pt has been afebrile with stable VS. Pt with persistent pain in abd, tx with prn oxy and morphine, with good results noted. Pt with active bowel sounds and sufficient good urine output. Pt NPO for most of shift for colonoscopy, pt currently off the floor after having completed colonoscopy. No new skin breakdown this shift. No s/s infection this shift. Fall precautions and pt safety maintained. Will continue to monitor.     Problem: Adult Inpatient Plan of Care  Goal: Plan of Care Review  Outcome: Ongoing - Unchanged  Goal: Patient-Specific Goal (Individualization)  Outcome: Ongoing - Unchanged  Goal: Absence of Hospital-Acquired Illness or Injury  Outcome: Ongoing - Unchanged  Goal: Optimal Comfort and Wellbeing  Outcome: Ongoing - Unchanged  Goal: Readiness for Transition of Care  Outcome: Ongoing - Unchanged  Goal: Rounds/Family Conference  Outcome: Ongoing - Unchanged

## 2019-02-13 NOTE — Unmapped (Addendum)
GASTROENTEROLOGY TREATMENT PLAN NOTE     Requesting Attending Physician :  Mauri Brooklyn, MD    Reason for Consult:    Rodney Bender is a 49 y.o. male seen in consultation at the request of Dr. Mauri Brooklyn, MD for Crohns disease.    ASSESSMENT & RECOMMENDATIONS    49 y.o.??male??with PMHx of??stricturing ileocolonic Crohn's (dx 1990), s/p ileocecal resection (x4, last in 2018) currently on Stelara, who presents with??new onset abdominal pain and syncope since 1/8??concerning for Crohn's flare.??  ??  Patient's presentation concerning for obstruction given the limited PO intake, nausea, and obstipation on presentation. Patient's history clinically fits an adhesion causing an obstruction. However, he would be significantly worse or have improved by now. He could also have significant luminal inflammation causing an obstruction. Therefore, patient started on IV Solumedrol 20 mg q8h on 1/13 for likely Crohn's flare and has been passing gas since. Patient wanting to try to attempt to drink bowel prep yesterday with the hope that he will begin having bowel movements and can have a colonoscopy today to re-stage his Crohn's disease. He was able to tolerate the prep and is having bowel movements as of this morning.   ??  Colonoscopy was completed today in GI procedures.  Full report will be available in the procedures tab under chart review in Epic.??Noted to have a prior end to side ileocolonic anastomosis that was patent and characterized by healthy mucosa.  Few, small apthae notes in neoterminal ileum, otherwise normal mucosa throughout.    ??  At this time, recommend discontinuation of steroids and would continue on outpatient Stelara therapy.  Will monitor symptoms overnight, if tolerating a soft diet can continue outpatient follow up for chronic abdominal pain, likely functional overlay.    ??  Recommendations:  - f/u path  - discontinue solumedrol  - start miralax 17g daily  - continue DVT ppx - start a soft, low fiber diet      Thank you for including [place] in the care of your patient. Patient discussed with Dr. Shawnie Pons.  Please page the GI Luminal consult pager at [phone] with any questions.    Rodney Hoehn, MD  Gastroenterology and Hepatology Fellow, PGY-6  [employer], [place]

## 2019-02-13 NOTE — Unmapped (Signed)
Progress Note      Assessment:  Patient is a 49 y.o. male with history of stricturing Crohn's (diagnosed 60). He is s/p sbr x3 and most recently ileocolectomy 08/2016 with ileocolonic anastomosis (5-cm of ileum and 5-cm of colon resected). Presented to [facility] ED with syncope and abdominal pain found to have leukocytosis and small bowel wall thickening on CT.     Interval: Colonoscopy shows several ulcers but no areas of stricture. Anastomosis able to be traversed. Patient having BM after taking prep.     Recommendations:   - No surgical intervention at this time. Continue non-operative management. We will sign off at this time. Please contact [facility] consult pager for any questions.    History of Present Illness:   Patient is a 49 y.o. male with history of stricturing Crohn's (diagnosed 53). He is s/p sbr x3 and most recently ileocolectomy 08/2016 with ileocolonic anastomosis (5-cm of ileum and 5-cm of colon resected). Presented to [facility] ED with syncope and abdominal pain found to have leukocytosis and small bowel wall thickening on CT. His last BM was 7-days go. Imaging from OSH shows diffuse small bowel thickening with surrounding free fluid c/w enteritis.   He was started on IV steroids 1/12 with minimal improvement in pain but passing gas. GI hoping for return of bowel function to prep and scope.     His most recent colonoscopy 03/2018 showed healthy mucosa at the site of anastomosis and able to be traversed. The small bowel proximal to the anastomosis appeared normal. No signs of inflammation..    He has a history of medical noncompliance. He has failed therapy with Remicade, 6-MP, Humira. Currently on Stelara      Review of Systems  10 systems were reviewed and are negative except as noted specifically in the HPI.    Objective:   BP 123/78  - Pulse 64  - Temp 36.3 ??C (Oral)  - Resp 18  - Ht 180.3 cm (5' 11)  - Wt 59.4 kg (131 lb)  - SpO2 98%  - BMI 18.27 kg/m??     Intake/Output last 3 shifts: I/O last 3 completed shifts:  In: 500 [I.V.:500]  Out: -     Physical Exam:  Pertinent physical findings include: Abdomen is soft, non-distended. Patient reports discomfort with

## 2019-02-13 NOTE — Unmapped (Signed)
Pt discharge to home, gave and reviewed discharge instructions and medicattion education scripts to pt. Patient verbalized understanding. IV site removed. Pt requested pain med before going home, refused hospital transport.

## 2019-02-13 NOTE — Unmapped (Signed)
Internal Medicine ([facility]) Progress Note    Assessment & Plan:   Rodney Bender is a 49 y.o. male with a PMHx of ileocolonic Crohn's disease (diagnosed in 68) status post ileocecal resection x4, transferred from [facility]  to [facility] with 3 days of abdominal pain and new syncope.    Principal Problem:    Abdominal pain  Active Problems:    Intestinal bypass or anastomosis status    Crohn's disease of both small and large intestine with other complication (CMS-HCC)    Chronic abdominal pain  Resolved Problems:    * No resolved hospital problems. *      Abdominal pain, syncope - concern for Crohn's flare: Admitted for 3 days of abdominal pain and syncopal episode in car. OSH CT a/p c/f bowel wall thickening. Differential diagnosis Crohn's flare versus gastroenteritis versus small bowel obstruction. Peritonitis unlikely given stability. TTE WNL and telemetry negative at 24 hours; syncope likely vasovagal in response to pain. Admitted with no bowel movements for multiple days, so C diff and infectious gastroenteritis unlikely. Pain improved. GI Surg also consulted for options, but recommended non-operative mgmt with aggressive bowel regimen. Zyprexa trialed and now scheduled nightly for nausea (should also help with underlying anxiety). Miralax bowel prep started previous night with goal for c-scope before long weekend. C-scope completed, largely normal. GI rec'd to stop steroids, continue Stelara, and anticipate discharge if tolerates soft residue diet. If continued pain, consider functional overlay.  --GI following, appreciate recs  --s/p c-scope  --Miralax QD  --Zyprexa at bedtime   --Bentyl QID PRN for cramping  --Compazine for nausea (Zofran trialed, patient reported not effective)  --stop solumedrol   --Tylenol for mild pain, oxy 10 q6h PRN for mod, IV morphine 2 q6h PRN for severe    Anxiety: Documented outpatient history, takes Buspar 10 BID PRN (unclear why PRN).  - start Buspar 10 BID  - Zyprexa as above    Ileocolonic Crohn's disease: Followed by Dr. Elizebeth Brooking of [facility].  Diagnosed in 1990.  Status post 4 ileocolonic resection. Colonoscopy 04/06/2018 limited due to poor preparation with stool in the entire examined colon, however patient end-to-side ileocolonic anastomosis, characterized by healthy-appearing mucosa and neoterminal ileum was normal.  --GI as above  --Stelara q8w as outpatient    Daily Checklist:  Diet: Clear Liquid Diet  DVT PPx: Not Indicated - Padua Score <4  Electrolytes: Replete Potassium to >/= 3.6 and Magnesium to >/= 1.8  Code Status: Full Code  Dispo: Home    Subjective:   No acute events overnight. Colonoscopy today.    Objective:   Temp:  [35.6 ??C-36.9 ??C] 36.1 ??C  Heart Rate:  [58-75] 67  Resp:  [12-24] 18  BP: (121-173)/(78-105) 165/105  SpO2:  [32 %-100 %] 97 %    Gen: alert, oriented, answers questions appropriately  HEENT: atraumatic, sclera anicteric, MMM. OP w/o erythema or exudate   Heart: RRR, S1, S2, no M/R/G, no chest wall tenderness  Lungs: CTAB, no crackles or wheezes, no use of accessory muscles  Abdomen: improved bowel sounds, more soft, diffusely TTP, non-distended, no rebound/guarding  Extremities: no clubbing, cyanosis, or edema  Psych: Appropriate mood and affect    Labs/Studies: Labs and Studies from the last 24hrs per EMR and Reviewed

## 2019-02-14 MED ORDER — POLYETHYLENE GLYCOL 3350 17 GRAM ORAL POWDER PACKET
PACK | Freq: Every day | ORAL | 0 refills | 30.00000 days | Status: CP
Start: 2019-02-14 — End: 2019-03-16

## 2019-02-14 NOTE — Unmapped (Signed)
Physician Discharge Summary [facility]  4 ONC [facility]  [address]  [place] [address]  Dept: [phone]  Loc: [phone]     Identifying Information:   Cadin Luka [facility]  1970-12-06  [id number]    Primary Care Physician: Suanne Marker, MD   Code Status: Full Code    Admit Date: 02/08/2019    Discharge Date: 02/13/2019     Discharge To: Home    Discharge Service: [facility] - GEN Med U Teaching (MDU)     Discharge Attending Physician: No att. providers found    Discharge Diagnoses:  Principal Problem:    Abdominal pain POA: Unknown  Active Problems:    Intestinal bypass or anastomosis status POA: Not Applicable    Crohn's disease of both small and large intestine with other complication (CMS-HCC) POA: Yes    Chronic abdominal pain POA: Yes  Resolved Problems:    * No resolved hospital problems. *      Outpatient Provider Follow Up Issues:   -- Continue to emphasize avoidance of narcotics  -- Follow-up path from cscope on 1/15    Hospital Course:   Consuelo Thayne is a 49 y.o. 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     Abdominal pain, diarrhea, syncope - concern for Crohn's flare:   Admitted for 3 days of abdominal pain and syncopal episode in car. OSH CT a/p c/f bowel wall thickening. Differential diagnosis Crohn's flare versus gastroenteritis versus small bowel obstruction. Peritonitis unlikely given stability.??TTE WNL and telemetry negative at 24 hours; syncope likely vasovagal in response to pain. Admitted with no bowel movements for multiple days, so C diff and infectious gastroenteritis unlikely. Pain improved. GI Surg also consulted for options, but recommended non-operative mgmt with aggressive bowel regimen. Zyprexa trialed and now scheduled nightly for nausea (should also help with underlying anxiety). Miralax bowel prep started previous night with goal for c-scope before long weekend. C-scope completed, largely normal. GI rec'd to stop steroids, continue Stelara, and anticipate discharge if tolerates soft residue diet. If continued pain, consider functional overlay. Tolerated diet before discharge.  -- Discharged with scripts for nightly zyprexa/compazine for nausea.  -- Per GI, no steroids, continue stelara q8 weeks.  -- Daily miralax on discharge  -- Follow-up path from cscope  -- Emphasized importance of refraining from narcotics and to take tylenol for pain if tolerated.   ??  Ileocolonic Crohn's disease: Followed by Dr. Elizebeth Brooking of [facility].  Diagnosed in 1990. Status post 4 ileocolonic resections. Colonoscopy 04/06/2018 limited due to poor preparation with stool in the entire examined colon, however patient end-to-side ileocolonic anastomosis, characterized by healthy-appearing mucosa and neoterminal ileum was normal.  ???Stelara every 8 weeks as an outpatient  --Outpatient follow-up to be arranged with Dr. Elizebeth Brooking    Syncope. Patient with reported episode of syncope in the setting of severe pain. No arrhythmias noted on telemetry. Orthostatics negative. Likely vagal response to severe pain.      Procedures:  Colonoscopy 1/15  ______________________________________________________________________  Discharge Medications:     Your Medication List      STOP taking these medications    hydrOXYzine 25 MG tablet  Commonly known as: ATARAX     ondansetron 4 MG disintegrating tablet  Commonly known as: ZOFRAN ODT  START taking these medications    acetaminophen 325 MG tablet  Commonly known as: TYLENOL  Take 2 tablets (650 mg total) by mouth every six (6) hours as needed for pain. Obtain over the counter     dicyclomine 10 mg capsule  Commonly known as: BENTYL  Take 1 capsule (10 mg total) by mouth 4 (four) times a day as needed (abdominal pain).     OLANZapine 2.5 MG tablet  Commonly known as: ZYPREXA  Take 1 tablet (2.5 mg total) by mouth nightly. Take nightly for nausea. Can stop as nausea improves.     polyethylene glycol 17 gram packet  Commonly known as: MIRALAX  Take 17 g by mouth daily.  Start taking on: February 14, 2019        CHANGE how you take these medications    prochlorperazine 5 MG tablet  Commonly known as: COMPAZINE  Take 1 tablet (5 mg total) by mouth every eight (8) hours as needed for up to 7 days.  What changed:   ?? how much to take  ?? reasons to take this        CONTINUE taking these medications    busPIRone 10 MG tablet  Commonly known as: BUSPAR  Take 10 mg by mouth Two (2) times a day. Takes as needed     dronabinoL 10 MG capsule  Commonly known as: MARINOL  TAKE 1 CAPSULE BY MOUTH TWICE DAILY BEFORE A MEAL     gabapentin 300 MG capsule  Commonly known as: NEURONTIN  Take 300 mg by mouth Three (3) times a day. Takes as needed     naloxone 4 mg/actuation nasal spray  Commonly known as: NARCAN  For respiratory depression from opioids     oxyCODONE 5 MG immediate release tablet  Commonly known as: ROXICODONE  Take 1 tablet (5 mg total) by mouth every eight (8) hours as needed for pain.     oxyCODONE-acetaminophen 10-325 mg per tablet  Commonly known as: PERCOCET  Take 1 tablet by mouth every eight (8) hours as needed.     sildenafiL 50 MG tablet  Commonly known as: VIAGRA  Take 50 mg by mouth daily as needed for erectile dysfunction.     STELARA 90 mg/mL Syrg syringe  Generic drug: ustekinumab  Inject the contents of 1 syringe (90 mg total) under the skin every 8 weeks.     therapeutic multivitamin tablet  Commonly known as: THERAGRAN  Take 1 tablet by mouth daily.            Allergies:  Hydrocodone-acetaminophen, Tramadol, Dilaudid [hydromorphone], Morphine, and Opioids - morphine analogues  ______________________________________________________________________  Pending Test Results (if blank, then none):  Pending Labs     Order Current Status    Surgical pathology exam Collected (02/12/19 1652)          Most Recent Labs:  All lab results last 24 hours -   Recent Results (from the past 24 hour(s))   Comprehensive Metabolic Panel    Collection Time: 02/13/19  7:38 AM   Result Value Ref Range    Sodium 129 (L) 135 - 145 mmol/L    Potassium 4.4 3.5 - 5.0 mmol/L    Chloride 92 (L) 98 - 107 mmol/L    Anion Gap 10 7 - 15 mmol/L    CO2 27.0 22.0 - 30.0 mmol/L    BUN 15 7 - 21 mg/dL    Creatinine 1.61 0.96 - 1.30 mg/dL    BUN/Creatinine Ratio  16     EGFR CKD-EPI Non-African American, Male >90 >=60 mL/min/1.73m2    EGFR CKD-EPI African American, Male >90 >=60 mL/min/1.28m2    Glucose 80 70 - 179 mg/dL    Calcium 9.4 8.5 - 13.0 mg/dL    Albumin 4.3 3.5 - 5.0 g/dL    Total Protein 7.3 6.5 - 8.3 g/dL    Total Bilirubin 0.7 0.0 - 1.2 mg/dL    AST 22 19 - 55 U/L    ALT 12 <50 U/L    Alkaline Phosphatase 86 38 - 126 U/L   Magnesium Level    Collection Time: 02/13/19  7:38 AM   Result Value Ref Range    Magnesium 2.0 1.6 - 2.2 mg/dL   PT-INR    Collection Time: 02/13/19  7:38 AM   Result Value Ref Range    PT 12.3 10.5 - 13.5 sec    INR 1.04    C-reactive protein    Collection Time: 02/13/19  7:38 AM   Result Value Ref Range    CRP 5.7 <10.0 mg/L   Sedimentation Rate    Collection Time: 02/13/19  7:38 AM   Result Value Ref Range    Sed Rate 7 0 - 15 mm/h   CBC w/ Differential    Collection Time: 02/13/19  7:38 AM   Result Value Ref Range    WBC 12.3 (H) 4.5 - 11.0 10*9/L    RBC 4.26 (L) 4.50 - 5.90 10*12/L    HGB 13.5 13.5 - 17.5 g/dL    HCT 86.5 (L) 78.4 - 53.0 %    MCV 94.2 80.0 - 100.0 fL    MCH 31.7 26.0 - 34.0 pg    MCHC 33.6 31.0 - 37.0 g/dL    RDW 69.6 29.5 - 28.4 %    MPV 8.2 7.0 - 10.0 fL    Platelet 331 150 - 440 10*9/L    Neutrophils % 63.8 %    Lymphocytes % 26.9 %    Monocytes % 6.9 %    Eosinophils % 0.7 %    Basophils % 0.1 %    Absolute Neutrophils 7.8 (H) 2.0 - 7.5 10*9/L    Absolute Lymphocytes 3.3 1.5 - 5.0 10*9/L    Absolute Monocytes 0.9 (H) 0.2 - 0.8 10*9/L    Absolute Eosinophils 0.1 0.0 - 0.4 10*9/L    Absolute Basophils 0.0 0.0 - 0.1 10*9/L    Large Unstained Cells 2 0 - 4 %     Microbiology -   Microbiology Results (last day)     ** No results found for the last 24 hours. **          Relevant Studies/Radiology (if blank, then none):  Ecg 12 Lead    Result Date: 02/13/2019  NORMAL SINUS RHYTHM MODERATE VOLTAGE CRITERIA FOR LVH, MAY BE NORMAL VARIANT EARLY REPOLARIZATION PATTERN BORDERLINE ECG WHEN COMPARED WITH ECG OF 10-Feb-2019 08:45, NO SIGNIFICANT CHANGE WAS FOUND Confirmed by Huel Coventry [phone]) on 02/13/2019 9:59:38 AM    Ecg 12 Lead    Result Date: 02/11/2019  SINUS BRADYCARDIA EARLY REPOLARIZATION PATTERN OTHERWISE NORMAL ECG WHEN COMPARED WITH ECG OF 08-Feb-2019 10:39, NO SIGNIFICANT CHANGE WAS FOUND Confirmed by Mariane Baumgarten ([id number]) on 02/11/2019 4:56:31 PM    Ecg 12 Lead    Result Date: 02/08/2019  NORMAL SINUS RHYTHM MINIMAL VOLTAGE CRITERIA FOR LVH, MAY BE NORMAL VARIANT ( Cornell product ) BORDERLINE ECG WHEN COMPARED WITH ECG OF 21-Oct-2016 20:23, QRS DURATION HAS INCREASED Confirmed by  Freeman Caldron ([id number]) on 02/08/2019 2:59:39 PM    Xr Abdomen 1 View    Result Date: 02/09/2019  EXAM: XR ABDOMEN 1 VIEW DATE: 02/08/2019 6:06 PM ACCESSION: [id number] UN DICTATED: 02/09/2019 7:47 AM INTERPRETATION LOCATION: [facility] CLINICAL INDICATION: 49 years old Male with ABDOMINAL PAIN  -  UNSPECIFIED SITE  COMPARISON: CT 02/07/2019 TECHNIQUE: Supine portable views of the abdomen FINDINGS: Lung bases are clear. Nonobstructive bowel gas pattern. Oral contrast transiting to the sigmoid colon. Scattered pelvic phleboliths. Unremarkable bones.     Nonobstructive bowel gas pattern.    Xr Abdomen 2 Views    Result Date: 02/11/2019  EXAM: XR ABDOMEN 2 VIEWS DATE: 02/11/2019 11:23 AM ACCESSION: [id number] UN DICTATED: 02/11/2019 1:25 PM INTERPRETATION LOCATION: [facility] CLINICAL INDICATION: 49 years old Male with CONSTIPATION  COMPARISON: Abdominal radiograph dated 02/08/2019 and prior. TECHNIQUE: Supine and upright views of the abdomen. FINDINGS: Nonobstructed bowel gas pattern. Surgical sutures in the right lower quadrant. Bowel is partially contrast opacified. Visualized portions of the lungs are clear. No acute osseous abnormality.     Nonobstructed bowel gas pattern with a focal air-fluid loop of bowel right upper quadrant nonspecific, nonobstructive.    Ct Body Interpretation Of Outside Film Corky Sox)    Result Date: 02/08/2019  EXAM: CT BODY INTERPRETATION OF OUTSIDE FILM [facility]) DATE: 02/08/2019 3:34 AM ACCESSION: [id number] UN DICTATED: 02/08/2019 8:12 AM INTERPRETATION LOCATION: [facility] CLINICAL INDICATION: R10.9 - Abdominal pain, unspecified abdominal location - K50.919 - Crohn's disease with complication, unspecified gastrointestinal tract location (CMS - HCC)  COMPARISON: CT abdomen pelvis dated 12/20/2017. TECHNIQUE: A request was received from Gerri Lins M.D. to interpret a CT of the abdomen and pelvis performed at [facility] on 02/07/2019. Images consist of contrast-enhanced axial and coronal images. Images were loaded into the [facility] PACS. FINDINGS: LINES AND TUBES: None. LOWER THORAX: Minimal dependent atelectasis HEPATOBILIARY: 0.6 cm hypoattenuating hepatic lesion is too small to characterize on CT (2:32). The gallbladder is present and otherwise unremarkable. No biliary dilatation.  SPLEEN: Unremarkable. PANCREAS: Unremarkable. ADRENALS: Unremarkable. KIDNEYS/URETERS: Numerous subcentimeter hypoattenuating lesions are seen bilaterally, the largest of which measures 0.8 cm (2:35). These are too small to characterize on CT. There is a tiny wedge-shaped hypoenhancing area in the lower pole of the left kidney with no perinephric inflammatory stranding, suggestive of focal scarring. BLADDER: Unremarkable. PELVIC ORGANS: Unremarkable. GI TRACT: Sequela of ileocecectomy with right lower quadrant ileocolic anastomosis, intact. Diffuse small bowel wall thickening is seen with surrounding free fluid suggestive of active inflammation. PERITONEUM/RETROPERITONEUM AND MESENTERY: No free air. Small volume free fluid seen surrounding numerous small bowel loops as above. LYMPH NODES: No enlarged lymph nodes. VESSELS: Normal in caliber. BONES AND SOFT TISSUES: No aggressive osseous lesions. Tiny bilateral fat-containing inguinal hernias. Small fat-containing umbilical hernia.     --Diffuse small bowel wall thickening is seen with surrounding free fluid consistent with enteritis. --Tiny wedge-shaped hypoenhancing area in the lower pole of the left kidney with no perinephric inflammatory stranding, nonspecific but suggestive of focal scarring.  Focal pyelonephritis and renal infarct are felt less likely given tiny nature of findings. PLEASE NOTE:  Our interpretation of studies performed at an outside institution is limited by factors including the absence of technical specifics of the image, undisclosed clinical information and the unavailability of the original interpretation.  Specialists at the institution that performed the study may have access to information not available to [place] that could make a difference in this interpretation.  We suggest that you obtain the original interpretation  from the site where the study was performed.    ______________________________________________________________________  Discharge Instructions:           Other Instructions     Discharge instructions      You were admitted to [facility] for abdominal pain. The colonoscopy GI did on 1/15 showed good healthy tissue in your gut. Steroids were discontinued and you were able to eat.   For nausea: Continue the nightly zyprexa and the oral compazine as needed. Stop the zofran you had at home while you are taking the compazine.  For pain: Take tylenol as needed. Try to hold off on any narcotics as this will make the abdominal pain worse in the long run. Consider also taking the bentyl as needed for abdominal cramping. Stop the atarax as that can cause worsening constipation.  Keep taking miralax daily.     Appointments: The GI team will arrange follow-up with Dr. Elizebeth Brooking. You will receive a call about this next week.     Please carefully read and follow these instructions below upon your discharge:    1) Please take your medications as prescribed and note the changes listed on your discharge. At future follow-up appointments, please be sure to take all of your medications with you so your provider can better guide your care.     2) Seek medical care with your primary care doctor or local Emergency Room or Urgent Care if you develop any changes in your mental status, worsening abdominal pain, fevers greater than 101.5, any unexplained/unrelieved shortness of breath, uncontrolled nausea and vomiting that keeps you from remaining hydrated or taking your medication, or any other concerning symptoms.     3) Please go to your follow-up appointments. Some of your follow-up appointments have been listed below. If you do not see an appointment listed below with your primary care doctor, please call your doctor's office as soon as possible to schedule an appointment to be seen within 7-10 days of discharge.     4) If you have any concerns before you are able to follow-up with your primary care doctor, you can reach [place] by calling [phone] and asking to page the General Medicine Team U (or Med U) resident on call.               Follow Up instructions and Outpatient Referrals     Discharge instructions            Appointments which have been scheduled for you    Feb 17, 2019  3:00 PM  (Arrive by 2:50 PM)  PT TREATMENT with Lanae Crumbly, PT  [facility] PT [facility]) [address]  [place] [address]  [phone]      Feb 25, 2019  2:00 PM  (Arrive by 1:30 PM)  FL ARTHROGRAM SHOULDER RIGHT with Onnie Boer RM 8  IMG [facility]) [address] [place] [address]  [phone]   Steele  Lovenox/enoxaparin - stop 12 hours prior to procedure     Feb 25, 2019  3:00 PM  (Arrive by 2:30 PM)  MRI JOING ARTHROGRAM with [facility] MRI RM 4  IMG MRI [facility]) [address]  [place] [address]  [phone]   On appt date:  Bring recent lab work  Bring documentation of any metal object implants  Take meds as usual  Check w/physician if diabetic  You will be asked to change into a  gown for your safety    On appt date do not:  Consume anything 2 hrs  Wear metallic items including jewelry (we are not responsible for lost items)    Let us know if pt:  Claustrophobic  Metal object implant  Pregnant  Prescribed a sedative  On dialysis  Allergic to MRI dye/contrast  Kidney Failure    (Title:[id number])     Mar 08, 2019  1:45 PM  RETURN  SPORTS with Gonzella Lex, MD  [facility] [address] [place] [facility] REGION) [phone] [address]  [place] [address]  [phone]           ______________________________________________________________________  Discharge Day Services:  BP 141/87  - Pulse 78  - Temp 36.6 ??C  - Resp 18  - Ht 180.3 cm (5' 11)  - Wt 59.4 kg (131 lb)  - SpO2 98%  - BMI 18.27 kg/m??   Pt seen on the day of discharge and determined appropriate for discharge.    Condition at Discharge: good    Length of Discharge: I spent greater than 30 mins in the discharge of this patient.

## 2019-02-15 DIAGNOSIS — Z09 Encounter for follow-up examination after completed treatment for conditions other than malignant neoplasm: Principal | ICD-10-CM

## 2019-02-20 NOTE — Unmapped (Signed)
[facility] Clinical Assessment & Refill Coordination Note    Rodney Bender, Rodney Bender: [birth year]  Phone: [phone] (home)     All above HIPAA information was verified with patient.     Was a Nurse, learning disability used for this call? No    Specialty Medication(s):   Inflammatory Disorders: Stelara     Current Outpatient Medications   Medication Sig Dispense Refill   ??? acetaminophen (TYLENOL) 325 MG tablet Take 2 tablets (650 mg total) by mouth every six (6) hours as needed for pain. Obtain over the counter  0   ??? busPIRone (BUSPAR) 10 MG tablet Take 10 mg by mouth Two (2) times a day. Takes as needed     ??? dicyclomine (BENTYL) 10 mg capsule Take 1 capsule (10 mg total) by mouth 4 (four) times a day as needed (abdominal pain). 30 capsule 0   ??? dronabinoL (MARINOL) 10 MG capsule TAKE 1 CAPSULE BY MOUTH TWICE DAILY BEFORE A MEAL 60 capsule 0   ??? gabapentin (NEURONTIN) 300 MG capsule Take 300 mg by mouth Three (3) times a day. Takes as needed     ??? 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 OLANZapine (ZYPREXA) 2.5 MG tablet Take 1 tablet (2.5 mg total) by mouth nightly. Take nightly for nausea. Can stop as nausea improves. 30 tablet 0   ??? oxyCODONE (ROXICODONE) 5 MG immediate release tablet Take 1 tablet (5 mg total) by mouth every eight (8) hours as needed for pain. 21 tablet 0   ??? oxyCODONE-acetaminophen (PERCOCET) 10-325 mg per tablet Take 1 tablet by mouth every eight (8) hours as needed.     ??? polyethylene glycol (MIRALAX) 17 gram packet Take 17 g by mouth daily. 30 packet 0   ??? prochlorperazine (COMPAZINE) 5 MG tablet Take 1 tablet (5 mg total) by mouth every eight (8) hours as needed for up to 7 days. 30 tablet 0   ??? sildenafil (VIAGRA) 50 MG tablet Take 50 mg by mouth daily as needed for erectile dysfunction.     ??? therapeutic multivitamin (THERAGRAN) tablet Take 1 tablet by mouth daily. ??? ustekinumab (STELARA) 90 mg/mL Syrg syringe Inject the contents of 1 syringe (90 mg total) under the skin every 8 weeks. 1 mL 5     No current facility-administered medications for this visit.         Changes to medications: Rodney Bender reports no changes at this time.    Allergies   Allergen Reactions   ??? Hydrocodone-Acetaminophen Anxiety     Other reaction(s): Other (See Comments)  Pt reports he takes this medication at home and tolerate it well   ??? Tramadol Itching and Anxiety     Other reaction(s): Other (See Comments)   ??? Dilaudid [Hydromorphone] Anxiety   ??? Morphine Itching   ??? Opioids - Morphine Analogues Itching       Changes to allergies: No    SPECIALTY MEDICATION ADHERENCE     Stelara 90 mg/ml: 0 days of medicine on hand     Medication Adherence    Patient reported X missed doses in the last month: 0  Specialty Medication: Stelara  Informant: patient  Confirmed plan for next specialty medication refill: delivery by pharmacy          Specialty medication(s) dose(s) confirmed: Regimen is correct and unchanged.     Are there any concerns with adherence? No    Adherence counseling provided? Not  needed    CLINICAL MANAGEMENT AND INTERVENTION      Clinical Benefit Assessment:    Do you feel the medicine is effective or helping your condition? Yes    Clinical Benefit counseling provided? Not needed    Adverse Effects Assessment:    Are you experiencing any side effects? No    Are you experiencing difficulty administering your medicine? No    Quality of Life Assessment:    How many days over the past month did your Crohn's disease  keep you from your normal activities? For example, brushing your teeth or getting up in the morning. 0    Have you discussed this with your provider? Not needed    Therapy Appropriateness:    Is therapy appropriate? Yes, therapy is appropriate and should be continued    DISEASE/MEDICATION-SPECIFIC INFORMATION For patients on injectable medications: Patient currently has 0 doses left.  Next injection is scheduled for 03/02/19.    PATIENT SPECIFIC NEEDS     ? Does the patient have any physical, cognitive, or cultural barriers? No    ? Is the patient high risk? No     ? Does the patient require a Care Management Plan? No     ? Does the patient require physician intervention or other additional services (i.e. nutrition, smoking cessation, social work)? No      SHIPPING     Specialty Medication(s) to be Shipped:   Inflammatory Disorders: Stelara    Other medication(s) to be shipped: none     Changes to insurance: No    Delivery Scheduled: Yes, Expected medication delivery date: 02/24/19.     Medication will be delivered via Same Day Courier to the confirmed prescription address in [facility].    The patient will receive a drug information handout for each medication shipped and additional FDA Medication Guides as required.  Verified that patient has previously received a Conservation officer, historic buildings.    All of the patient's questions and concerns have been addressed.    [facility] Specialty Pharmacist
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
## 2019-02-22 ENCOUNTER — Ambulatory Visit: Admit: 2019-02-22 | Discharge: 2019-02-22 | Payer: MEDICARE

## 2019-02-22 DIAGNOSIS — E871 Hypo-osmolality and hyponatremia: Principal | ICD-10-CM

## 2019-02-22 DIAGNOSIS — Z09 Encounter for follow-up examination after completed treatment for conditions other than malignant neoplasm: Principal | ICD-10-CM

## 2019-02-22 LAB — SODIUM: Sodium:SCnc:Pt:Ser/Plas:Qn:: 131 — ABNORMAL LOW

## 2019-02-22 LAB — BASIC METABOLIC PANEL
ANION GAP: 7 mmol/L (ref 7–15)
BLOOD UREA NITROGEN: 16 mg/dL (ref 7–21)
BUN / CREAT RATIO: 18
CHLORIDE: 97 mmol/L — ABNORMAL LOW (ref 98–107)
CO2: 27 mmol/L (ref 22.0–30.0)
CREATININE: 0.87 mg/dL (ref 0.70–1.30)
EGFR CKD-EPI AA MALE: >90 mL/min/{1.73_m2} (ref >=60)
POTASSIUM: 5.2 mmol/L — ABNORMAL HIGH (ref 3.5–5.0)
SODIUM: 131 mmol/L — ABNORMAL LOW (ref 135–145)

## 2019-02-22 MED ORDER — PROCHLORPERAZINE MALEATE 5 MG TABLET
ORAL_TABLET | Freq: Four times a day (QID) | ORAL | 0 refills | 8 days | Status: CP | PRN
Start: 2019-02-22 — End: 2019-03-01

## 2019-02-22 MED ORDER — DRONABINOL 10 MG CAPSULE
ORAL_CAPSULE | Freq: Two times a day (BID) | ORAL | 0 refills | 30.00000 days | Status: CP
Start: 2019-02-22 — End: ?

## 2019-02-22 NOTE — Unmapped (Addendum)
Extra strength sleep by a company named [employer].  Chew two gummies at bedtime.      Increase buspar to one and a half tablets in the morning and one at night

## 2019-02-22 NOTE — Unmapped (Signed)
[facility] BPs  BP#1 123/72  BP#2    114/74  BP#3    111/76    Average BP   116/74  P 67

## 2019-02-22 NOTE — Unmapped (Signed)
REASON FOR VISIT: Hospital Follow-up Medication Management    ASSESSMENT AND PLAN:  #Medication Management  - Medications prescribed or ordered upon discharge were reviewed today and reconciled with the most recent outpatient medication list.  Medication reconciliation was conducted by a prescribing practitioner, or clinical pharmacist.   - Reviewed the indication, dose, and frequency of each medication with patient    Recommendations and medication-related problems were discussed directly with Rodney Bender who will see [name] immediately following this visit. Updated medication list and medication changes will be provided to the patient as a printed after visit summary.    HISTORY OF PRESENT ILLNESS:   Rodney Bender is a 49 y.o. year old male with ileocolonic Crohn's disease (diagnosed in 40) status post ileocecal resection x4, anxiety  who was recently hospitalized for abdominal pain, presents to the Internal Medicine clinic for hospital follow up.     Per discharge summary:  -- Discharged with scripts for nightly zyprexa/compazine for nausea.  -- Per GI, no steroids, continue stelara q8 weeks.  -- Daily miralax on discharge  -- Follow-up path from cscope  -- Emphasized importance of refraining from narcotics and to take tylenol for pain if tolerated.         Today, he reports doing okay. No med questions or concerns.  Buspirone makes him feel jittery. Taking it only if he really needs it. Last took it about 1 week ago.    [name] did not bring medication bottles. Requests refills of Marinol and compazine.    Patient manages medications at home.     Patient does not have to do without their medications because of cost.     DISCREPANCIES NOTED DURING MED REVIEW:  1.   Miralax - none since discharge using prn only    He is not able to tell me how often he uses any prn meds. No taking buspirone scheduled (rarely using)  Still taking oxycodone-apap 10-325mg  prn (took yesterday)    MEDICATIONS AND ALLERGIES:   Reviewed and updated in EPIC      ___________________________________________________   Jeanella Flattery, PharmD, CPP

## 2019-02-24 MED FILL — STELARA 90 MG/ML SUBCUTANEOUS SYRINGE: 56 days supply | Qty: 1 | Fill #3 | Status: AC

## 2019-02-24 MED FILL — STELARA 90 MG/ML SUBCUTANEOUS SYRINGE: SUBCUTANEOUS | 56 days supply | Qty: 1 | Fill #3

## 2019-02-25 ENCOUNTER — Ambulatory Visit: Admit: 2019-02-25 | Discharge: 2019-02-26 | Payer: MEDICARE

## 2019-03-03 ENCOUNTER — Ambulatory Visit
Admit: 2019-03-03 | Discharge: 2019-04-01 | Payer: MEDICARE | Attending: Rehabilitative and Restorative Service Providers" | Primary: Rehabilitative and Restorative Service Providers"

## 2019-03-03 NOTE — Unmapped (Signed)
[facility] PT [facility]  OUTPATIENT PHYSICAL THERAPY  03/03/2019          Patient Name: Rodney Bender [facility]  Date of Birth:[birth year]  Diagnosis:   Encounter Diagnoses   Name Primary?   ??? Chronic right shoulder pain Yes   ??? Traumatic tear of right rotator cuff, unspecified tear extent, subsequent encounter      Referring MD:  Gonzella Lex, MD     Plan of Care Effective Date:       Assessment & Plan     Assessment details: Pt arrives for first PT visit in about a month due to recent hospitalization for abdominal pain. Pt reports abdominal symptoms feeling better since hospitalization, but continues to report significant R shoulder pain that varies in intensity throughout the day and reports consistent, painful shoulder popping that feels like the muscle is moving around the bone. Focused on gentle manual therapy for pain control and stretching today. Pt continues to report significant pain during PROM at moderate ranges but decreased stiffness was noted during PROM in all planes. Will hold HEP and PT for now due to patient's symptoms and limited overall progress with PT. Pt will see ortho next Monday to discuss imaging and other steps.       Impairments: decreased range of motion, decreased strength, pain and postural weakness                Clinical Presentation: stable  Clinical Decision Making: low    Prognosis: good      Negative Prognosis Rationale: Pain Status and chronicity of condition.        Therapy Goals  Goals: In 2 weeks, pt will be independent with home exercise program-MET  In 6 weeks, pt will demonstrate 140 deg of R shld flex PROM and 80 deg of shld abd PROM-MET  In 12 weeks, pt will demonstrate R shld AROM WFL without pain in order to return to previous functional and recreational activities-PROGRESSING      Plan  Therapy options: will be seen for skilled physical therapy services    Planned therapy interventions: manual therapy, neuromuscular re-education, postural training, body mechanics training, education - patient, therapeutic exercises and home exercise program        Frequency: 1x week (1-2x week)    Duration in weeks: 12    Education provided to: patient.  Education provided: importance of Therapy and HEP  Education Results: verbalized good understanding.    Next visit plan: continue to progress PROM and therapeutic exercise per protocol  Total Session Time: 30  Treatment rendered today: Manual therapy (25 min)  Supine R shld ER PROM at 0, 45, 60, and 90 deg abd: up to 70 deg  Supine R shld flex PROM 0-135 deg  Supine R shld abd PROM 0-105 deg  Supine R shld horiz add PROM  Supine R elbow flex/ext PROM  STM to R biceps  Gentle oscillations at R shoulder   -did not continue further PROM due to pain. Less stiffness noted in all planes during PROM    -reassessed shoulder AROM: pt demonstrated 125 deg of shld flex and 110 deg of shld abd  -advised pt to hold on all home exercises for now. Use R UE for functional activities as tolerated.  -advised pt to continue using heat as needed for pain    Previous HEP:   Seated shld flex/abd/functional IR/horiz add AAROM with towel  Shld flex/abd wall slides  Shoulder flex, ext, ER/IR isometrics  Biceps curls  Supine ER stretch above head and at 90/90  AAROM chest press, shld flex, ER, abd with rod  R shld pendulum  ER and shld ext with weight  Rows, shld ext, ER, IR with band  shld flex, abd with weight  Plan details: Will progress per RCR protocol          Subjective     History of Present Illness  Date of Onset: 10/22/2013  Date of Surgery: 10/02/2018  Date of Evaluation: 10/23/2018  Surgical Procedure: RCR (R RCR)  Reason for Referral/Chief Complaint: R shoulder pain- postop R rotator cuff surgery      Subjective: Shoulder has its good days and bad days. Received injection in January which has been helpful for pain. Was hospitalized for abdominal pain in January for several days. Reports that he is continuing to feel better (abdominal symptoms) since hospitalization. Has not performed specific exercises at home, but has been using R UE for functional activities such as taking out trash and reaching for cabinet. Hot shower has been particularly helpful for shoulder and reports that he can move his shoulder much better after a hot shower. Still has painful popping in shoulder that appears to be more consistent when he moves his shoulder. Had an MRI on 1/28 (summarized below).    Quality of life: good          Pain      Current pain rating: 6      At best pain rating: 4      At worst pain rating: 9      Location: R anterior and lateral shoulder      Quality: aching, sharp, numbing, tingling and throbbing (numbness in R hand)      Relieving factors: medications and ice      Exacerbated by: Pain during sleep in any position.      Pain Related Behaviors: none    Progression: improved (Does not have pressure sensation in posterior R shoulder as before surgery)              Prior Functional Status: Physical Limitation(s)-work and recreational activities    Current functional status: disturbed sleep, limited lifting and limited recreation      Precautions and Equipment  : postop protocol.  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  1. Interval postsurgical changes of supraspinatus tendon repair. Low-grade partial-thickness interstitial tear of the supraspinatus tendon at the footprint. High-grade tear of the midsubstance tendon with bursal surface fraying and contrast within subacromial space, compatible with a communicating tear.  2. No evidence of glenoid labral tear.  ??    Treatments      Previous treatment: physical therapy                      Patient Goals  Patient goals for therapy: improved sleep, increased ROM, increased strength, decreased pain, return to  recreational activites and return to work      Patient goal: return to maintenance work, sleeping in any position without pain; playing basketball          Objective     Static Posture     Head  Forward.    Postural Observations  Seated posture: fair  Standing posture: fair        Palpation     Right   No palpable tenderness to the biceps and wrist extensors.   Hypertonic in the upper trapezius. Tenderness of the anterior deltoid, middle deltoid, posterior deltoid, triceps and upper trapezius.     Tenderness     Right Shoulder  Tenderness in the [facility] joint.     Additional Tenderness Details  Significant tenderness in R AC joint and R posterolateral shoulder upon light touch    Active Range of Motion   Left Shoulder   Flexion: WFL  Abduction: WFL  External rotation BTH: T2 with pain  Internal rotation BTB: T10     Right Shoulder   Flexion: 125 degrees with pain  Abduction: 110 degrees with pain  External rotation BTH: T2 with pain  Internal rotation BTB: L1 with pain    Left Elbow   Flexion: WFL  Extension: WFL    Right Elbow   Flexion: WFL and with pain  Extension: WFL and with pain    Left Wrist   Normal active range of motion    Right Wrist   Normal active range of motion    Passive Range of Motion     Right Shoulder   Flexion: 170 degrees with pain  Abduction: 165 degrees with pain  External rotation 0??: 80 degrees with pain  External rotation 45??: 70 degrees with pain  External rotation 90??: 70 degrees   Internal rotation 45??: 60 degrees     Right Elbow   Flexion: WFL and with pain  Extension: WFL    Right Wrist   Normal passive range of motion    Strength/Myotome Testing     Left Shoulder     Planes of Motion   Abduction: 5   External rotation at 0??: 5   Internal rotation at 0??: 5 Right Shoulder     Planes of Motion   External rotation at 0??: 4   Internal rotation at 0??: 4     Left Elbow   Flexion: 5  Extension: 5    Right Elbow   Flexion: 4  Extension: 4    Left Wrist/Hand   Wrist extension: 5  Wrist flexion: 5    Right Wrist/Hand   Wrist extension: 5  Wrist flexion: 5    Additional Strength Details  Pain with R elbow MMT                            I attest that I have reviewed the above information.  Signed: Lanae Crumbly, PT  03/03/2019 2:33 PM

## 2019-03-08 ENCOUNTER — Ambulatory Visit: Admit: 2019-03-08 | Discharge: 2019-03-09 | Payer: MEDICARE

## 2019-03-08 NOTE — Unmapped (Signed)
PRE-SURGICAL INFORMATION & CHECKLIST   SCHEDULING     One of our departmental surgery schedulers will contact you to confirm the date of your surgery and to set up a COVID test which is required prior to surgery.      THE WEEK BEFORE YOUR SURGERY    1. You will bleed less during surgery if you stop taking anti-inflammatory medications at least one week before surgery.  These include ASPIRIN, IBUPROFEN (Motrin, Advil), NAPROXYN (Naprosyn), NAPROXEN SODIUM (Aleve), INDOMETHACIN (Indocin), CELEBREX and several other prescription anti-inflammatories commonly prescribed for arthritis or other musculoskeletal conditions. Discontinue FISH OIL supplements as well.  Ask your doctor before discontinuing any medication.   2. If you normally shave the area of surgery, please stop shaving one week (or more) before surgery. Germs grow in small razor nicks from shaving and will increase the risk of infecti    THE LAST BUSINESS DAY BEFORE YOUR SURGERY    1. Surgery times are given out from 2:00-5:00 p.m. on the last business day before scheduled surgery. A preoperative nurse will call you during this time to confirm when you should report for your surgery. If you have not been called by 5:00 pm, you may call the [facility] at [phone].   2. If you will not be staying at your home address the night before surgery, please call the Ambulatory Procedure Center to let them know where you can be reached.     THE NIGHT BEFORE YOUR SURGERY    1. DO NOT EAT OR DRINK ANYTHING AFTER MIDNIGHT, unless instructed otherwise.  2. Take your usual medications as prescribed, unless instructed otherwise.   3. Shower with antibacterial soap the night before and the morning of surgery.     THE MORNING OF YOUR SURGERY     1. Take a shower or bath and wash the surgical area with the antibacterial soap you have been given in the clinic.  2.  Remember to bring any crutches, braces or other equipment that have been supplied for use immediately following your surgery.   3.    You may take your usual medications as prescribed with a small sip of water, unless instructed otherwise.  Do not drink a full glass of water.      A RESPONSIBLE ADULT (OVER 18) MUST ACCOMPANY YOU ON THE DAY OF SURGERY AND BE AVAILABLE THROUGHOUT YOUR PROCEDURE IN THE EVENT THERE ARE QUESTIONS OR COMPLICATIONS.  You must have an adult available to take you home following your procedure as it will not be safe for you to drive or take public transportation alone.          The [place] [facility] [place] [facility] ? [phone] ? [place], [place] [address] Phone [phone] ? Fax [phone]
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
## 2019-03-10 DIAGNOSIS — S46211A Strain of muscle, fascia and tendon of other parts of biceps, right arm, initial encounter: Principal | ICD-10-CM

## 2019-03-10 DIAGNOSIS — M75101 Unspecified rotator cuff tear or rupture of right shoulder, not specified as traumatic: Principal | ICD-10-CM

## 2019-03-11 ENCOUNTER — Ambulatory Visit: Admit: 2019-03-11 | Discharge: 2019-03-11 | Payer: MEDICARE

## 2019-03-11 DIAGNOSIS — Z0000 Encounter for general adult medical examination without abnormal findings: Principal | ICD-10-CM

## 2019-03-11 DIAGNOSIS — E875 Hyperkalemia: Principal | ICD-10-CM

## 2019-03-11 DIAGNOSIS — E871 Hypo-osmolality and hyponatremia: Principal | ICD-10-CM

## 2019-03-11 DIAGNOSIS — K50818 Crohn's disease of both small and large intestine with other complication: Principal | ICD-10-CM

## 2019-03-11 DIAGNOSIS — M75101 Unspecified rotator cuff tear or rupture of right shoulder, not specified as traumatic: Principal | ICD-10-CM

## 2019-03-11 DIAGNOSIS — F419 Anxiety disorder, unspecified: Principal | ICD-10-CM

## 2019-03-11 DIAGNOSIS — E785 Hyperlipidemia, unspecified: Principal | ICD-10-CM

## 2019-03-11 LAB — LIPID PANEL
CHOLESTEROL/HDL RATIO SCREEN: 3 (ref <5.0)
CHOLESTEROL: 151 mg/dL (ref 100–199)
HDL CHOLESTEROL: 51 mg/dL (ref 40–59)
NON-HDL CHOLESTEROL: 100 mg/dL
TRIGLYCERIDES: 129 mg/dL (ref 1–149)
VLDL CHOLESTEROL CAL: 25.8 mg/dL (ref 11–50)

## 2019-03-11 LAB — BASIC METABOLIC PANEL
BLOOD UREA NITROGEN: 13 mg/dL (ref 7–21)
BUN / CREAT RATIO: 16
CALCIUM: 9.1 mg/dL (ref 8.5–10.2)
CHLORIDE: 100 mmol/L (ref 98–107)
CO2: 29 mmol/L (ref 22.0–30.0)
CREATININE: 0.82 mg/dL (ref 0.70–1.30)
EGFR CKD-EPI AA MALE: >90 mL/min/{1.73_m2} (ref >=60)
EGFR CKD-EPI NON-AA MALE: >90 mL/min/{1.73_m2} (ref >=60)
GLUCOSE RANDOM: 62 mg/dL — ABNORMAL LOW (ref 70–179)
POTASSIUM: 4.5 mmol/L (ref 3.5–5.0)
SODIUM: 133 mmol/L — ABNORMAL LOW (ref 135–145)

## 2019-03-11 LAB — CHLORIDE: Chloride:SCnc:Pt:Ser/Plas:Qn:: 100

## 2019-03-11 LAB — THYROID STIMULATING HORMONE: Thyrotropin:ACnc:Pt:Ser/Plas:Qn:: 0.476 — ABNORMAL LOW

## 2019-03-11 LAB — CHOLESTEROL/HDL RATIO SCREEN: Lab: 3

## 2019-03-11 MED ORDER — CITALOPRAM 10 MG TABLET
ORAL_TABLET | Freq: Every day | ORAL | 3 refills | 90.00000 days | Status: CP
Start: 2019-03-11 — End: 2020-03-10

## 2019-03-11 NOTE — Unmapped (Signed)
Internal Medicine Clinic Visit    Reason for visit: Follow-up    A/P: Patient is a 49 year old male with a history of Crohn's disease s/p ileocecal resection x4 and anxiety who presents for follow-up.    Anxiety: GAD and PHQ both 14 today.  Says his anxiety has been worsening.  Interfering with his sleep.  Main triggers all seem to be related to his Crohn's disease.  His IBD causes him significant discomfort, has led to recent hospitalizations, and prevents him from being able to work.  Was seen in clinic on 1/25 and had BuSpar dose increased.  He says that it is not working and he does not want to continue taking it.  A melatonin +L-theonine supplement was also added to his regimen at that time since he was reporting poor sleep, but he says this is also not helping.  Has previously tried numerous medications including zoloft, trazadone, remeron, seroquel, and melatonin, all of which he has already taken and didn't want to try again.  Has previously seen psychiatry and doesn't want to go back, but he is willing to discuss a referral to the counselors here at the internal medicine clinic.  Previous providers have also wondered if he could have restless leg syndrome contributing to poor sleep.  Ferritin on 2/7 was 90, but in setting of underlying inflammation could still represent some iron deficiency.  ???Discontinue BuSpar since he is not taking this  --He agreed to start celexa 10 mg daily. Will increase by 10 mg weekly until reaches target dose of 40 mg.    ???Referral sent for counseling at the internal medicine clinic  ???Continue Zyprexa at night (also being used to help with nausea)  ???Recheck ferritin  ???Check TSH  ???Will have a virtual enhanced care visit in 1 week and then follow-up in clinic in 1 month.    Crohn's disease: Follows with [facility].  Recently admitted for abdominal pain that was initially concerning for an IBD flare.  However, underwent fairly extensive work-up, including colonoscopy with biopsy, that was all very reassuring.  Pathology showed superficial fragments of mucosa with moderately active enteritis with reactive atypia (from small bowel) and chronic quiescent Crohn's disease (from colon).  Has been doing much better since leaving the hospital.  ???Continue to follow up with GI  ???Continue Stelara every 8 weeks  ???Continue daily MiraLAX    Hyperkalemia and hyponatremia: Noted to have hyperkalemia and hyponatremia on previous labs in January.  Trends not consistent with adrenal insufficiency.  Did receive short course of steroids during hospitalization but has not been on steroids chronically.  Will recheck today.     Low blood pressure: Asymptomatic with BP 98/59 today.  All other vitals within normal limits.  Encouraged him to continue to take in fluids.    Torn right rotator cuff: Tentatively scheduled for surgery on 2/19 with orthopedic surgery.    Healthcare maintenance: Due for lipid screening (ordered) and flu vaccine (refused)    Rodney was seen today for anxiety.    Diagnoses and all orders for this visit:    Rotator cuff syndrome of right shoulder    Crohn's disease of both small and large intestine with other complication (CMS-HCC)    Hyponatremia    Hyperkalemia  -     Basic metabolic panel; Future    Anxiety  -     TSH; Future  -     Ambulatory referral to Counseling; Future    Healthcare maintenance    Health  care maintenance    Hyperlipidemia, unspecified hyperlipidemia type  -     Lipid Panel; Future    Other orders  -     citalopram (CELEXA) 10 MG tablet; Take 1 tablet (10 mg total) by mouth daily.          __________________________________________________________    HPI:    Mr. Bender feels very anxious today.  Anxiety has been a longstanding problem for him but he says that it is only getting worse.  He was in clinic on 1/25, and at that time his BuSpar dose was increased.  He says this has not helped.  He also was started on melatonin to help with the sleep, but he says that his sleep is only worsening.  He reports not sleeping for the last 3 to 4 days.  When pressed, he says that he probably sleeps around 3 hours a night.  He reports frequent awakenings, and he often gets up with racing thoughts and paces in his room.  He describes occasionally having shortness of breath when he lies down which he says is similar to a panic attack.  He has not had any chest pain and no paroxysmal nocturnal dyspnea.  No swelling in his feet or ankles.    He does not endorse any fevers, but he does say that he has had sensations of warmth which he describes as being like hot flashes.  He says that these are worse at night and have made him very sweaty.  He has had to change his shirt and pillowcases before because of sweating.  He denies any significant weight loss recently and has not noticed any lymphadenopathy.    He continues to endorse chronic abdominal pain related to his Crohn's disease.  He does not have any nausea, vomiting, or diarrhea today.  __________________________________________________________    Problem List:  Patient Active Problem List   Diagnosis   ??? Tobacco use disorder   ??? Chronic nausea   ??? Intestinal bypass or anastomosis status   ??? Mixed, or nondependent drug abuse, in remission (CMS-HCC)   ??? Crohn's disease of both small and large intestine with other complication (CMS-HCC)   ??? Chronic abdominal pain   ??? Rotator cuff syndrome of right shoulder   ??? Abdominal pain   ??? Traumatic partial tear of right biceps tendon   ??? Hyponatremia   ??? Hyperkalemia   ??? Anxiety       Medications:  Reviewed in EPIC  __________________________________________________________    Physical Exam:   Vital Signs:  Vitals:    03/11/19 1522   BP: 98/59   Pulse: 58   Weight: 67.1 kg (148 lb)   Height: 180.3 cm (5' 11)       Gen: Anxious appearing adult male, NAD  CV: RRR, no murmurs  Pulm: CTA bilaterally, no crackles or wheezes  Abd: Soft, NTND, normal BS. No HSM.  Ext: No edema

## 2019-03-11 NOTE — Unmapped (Signed)
[facility] BPs  BP#1 98/58   BP#2 98/59  BP#3 98/59    Average BP 98/58  (please note this as a comment in vitals)

## 2019-03-11 NOTE — Unmapped (Signed)
Stop buspirone. Start celexa (Citalopram) 10 mg daily. Increase by 10 mg weekly until you get to 40 mg daily.     I will check labs today and call you with the results.    I will also send a referral to our counselors here at the clinic. You should receive a call to schedule an appointment within the next week.

## 2019-03-11 NOTE — Unmapped (Deleted)
Internal Medicine Clinic Visit    Reason for visit: Follow-up    A/P: Patient is a 49 year old male with a history of Crohn's disease s/p ileocecal resection x4 and anxiety who presents for follow-up.    Crohn's disease: Follows with [facility].  Recently admitted for abdominal pain that was initially concerning for an IBD flare.  However, underwent fairly extensive work-up, including colonoscopy with biopsy, that was all very reassuring.  Pathology showed superficial fragments of mucosa with moderately active enteritis with reactive atypia (from small bowel) and chronic quiescent Crohn's disease (from colon).  Has been doing much better since leaving the hospital.  ???Continue to follow up with GI  ???Continue Stelara every 8 weeks  ???Continue daily MiraLAX    Anxiety: GAD today ***.  Was seen in clinic on 1/25 and had BuSpar dose increased.  A melatonin +L-theonine supplement was also added to his regimen at that time since he was reporting poor sleep.  Has previously tried numerous medications including zoloft, trazadone, remeron, seroquel, and melatonin, all of which he has already taken and didn't want to try again.  Has previously seen psychiatry and doesn't want to go back.  Previous providers have also wondered if he could have restless leg syndrome contributing to poor sleep.  Ferritin on 2/7 was 90, but in setting of underlying inflammation could still represent some iron deficiency.  ???Continue BuSpar  ???Continue Zyprexa at night (also being used to help with nausea)  ???Recheck ferritin     Hyperkalemia: 5.2 on 1/25.  ???Recheck potassium today    Hyponatremia: 131 on 1/25.  ???Recheck sodium today    Torn right rotator cuff: Tentatively scheduled for surgery on 2/19 with orthopedic surgery.    Healthcare maintenance: Due for lipid screening and flu vaccine    There are no diagnoses linked to this encounter.      __________________________________________________________    HPI:    *** __________________________________________________________    Problem List:  Patient Active Problem List   Diagnosis   ??? Tobacco use disorder   ??? Chronic nausea   ??? Intestinal bypass or anastomosis status   ??? Mixed, or nondependent drug abuse, in remission (CMS-HCC)   ??? Crohn's disease of both small and large intestine with other complication (CMS-HCC)   ??? Chronic abdominal pain   ??? Rotator cuff syndrome of right shoulder   ??? Abdominal pain   ??? Traumatic partial tear of right biceps tendon       Medications:  Reviewed in EPIC  __________________________________________________________    Physical Exam:   Vital Signs:  There were no vitals filed for this visit.    Gen: Well appearing, NAD  CV: RRR, no murmurs  Pulm: CTA bilaterally, no crackles or wheezes  Abd: Soft, NTND, normal BS. No HSM.  Ext: No edema  ***

## 2019-03-12 DIAGNOSIS — R7989 Other specified abnormal findings of blood chemistry: Principal | ICD-10-CM

## 2019-03-12 DIAGNOSIS — E079 Disorder of thyroid, unspecified: Principal | ICD-10-CM

## 2019-03-12 LAB — FREE T4: Thyroxine.free:MCnc:Pt:Ser/Plas:Qn:: 1.63 — ABNORMAL HIGH

## 2019-03-12 NOTE — Unmapped (Signed)
Immediately after or during the visit, I reviewed with the resident the medical history and the resident's findings on physical examination.  I discussed with the resident the patient's diagnosis and concur with the treatment plan as documented in the resident note

## 2019-03-16 DIAGNOSIS — E0590 Thyrotoxicosis, unspecified without thyrotoxic crisis or storm: Principal | ICD-10-CM

## 2019-03-17 ENCOUNTER — Ambulatory Visit: Admit: 2019-03-17 | Discharge: 2019-03-18 | Payer: MEDICARE | Attending: Family | Primary: Family

## 2019-03-17 NOTE — Unmapped (Signed)
COVID Pre-Procedure Intake Form     Assessment     Rodney Bender is a 49 y.o. male presenting to [facility] for COVID testing.     Plan     If no testing performed, pt counseled on routine care for respiratory illness.  If testing performed, COVID sent.  Patient directed to Home given findings during today's visit.    Subjective     Rodney Bender is a 49 y.o. male who [facility] with complaints of the following:    Exposure History: In the last 21 days?     Have you traveled outside of [place]? No               Have you been in close contact with someone confirmed by a test to have COVID? (Close contact is within 6 feet for at least 10 minutes) No       Have you worked in a health care facility? No     Lived or worked facility like a nursing home, group home, or assisted living?    No         Are you scheduled to have surgery or a procedure in the next 3 days? Yes               Are you scheduled to receive cancer chemotherapy within the next 7 days?    No     Have you ever been tested before for COVID-19 with a swab of your nose? Yes: When: unknown, Where: [facility]   Are you a healthcare worker being tested so to return to work No     Right now,  do you have any of the following that developed over the past 7 days (as stated by patient on intake form):    Subjective fever (felt feverish) No   Chills (especially repeated shaking chills) No   Severe fatigue (felt very tired) No   Muscle aches No   Runny nose No   Sore throat No   Loss of taste or smell No   Cough (new onset or worsening of chronic cough) No   Shortness of breath No   Nausea or vomiting No   Headache No   Abdominal Pain No   Diarrhea (3 or more loose stools in last 24 hours) No       Scribe's Attestation: Omya Winfield L. Lorin Picket, FNP obtained and performed the history, physical exam and medical decision making elements that were entered into the chart.  Signed by Victorino Sparrow serving as Scribe, on 03/17/2019 10:17 AM      The documentation recorded by the scribe accurately reflects the service I personally performed and the decisions made by me. Aida Puffer, FNP  March 19, 2019 11:02 AM

## 2019-03-19 ENCOUNTER — Ambulatory Visit: Admit: 2019-03-19 | Discharge: 2019-03-19 | Payer: MEDICARE

## 2019-03-19 ENCOUNTER — Encounter
Admit: 2019-03-19 | Discharge: 2019-03-19 | Payer: MEDICARE | Attending: Student in an Organized Health Care Education/Training Program | Primary: Student in an Organized Health Care Education/Training Program

## 2019-03-19 ENCOUNTER — Ambulatory Visit: Admit: 2019-03-19 | Discharge: 2019-03-20 | Payer: MEDICARE

## 2019-03-19 MED ORDER — POLYETHYLENE GLYCOL 3350 17 GRAM/DOSE ORAL POWDER
Freq: Every day | ORAL | 0 refills | 14.00000 days | Status: CP | PRN
Start: 2019-03-19 — End: 2019-04-02
  Filled 2019-03-19: qty 238, 14d supply, fill #0

## 2019-03-19 MED ORDER — ACETAMINOPHEN 500 MG TABLET
ORAL_TABLET | Freq: Three times a day (TID) | ORAL | 0 refills | 14.00000 days | Status: CP | PRN
Start: 2019-03-19 — End: 2019-04-02

## 2019-03-19 MED ORDER — GABAPENTIN 100 MG CAPSULE
ORAL_CAPSULE | Freq: Three times a day (TID) | ORAL | 0 refills | 5.00000 days | Status: CP
Start: 2019-03-19 — End: 2019-03-24
  Filled 2019-03-19: qty 15, 5d supply, fill #0

## 2019-03-19 MED ORDER — SENNOSIDES 8.6 MG TABLET
ORAL_TABLET | Freq: Every day | ORAL | 0 refills | 28.00000 days | Status: CP | PRN
Start: 2019-03-19 — End: 2019-04-16
  Filled 2019-03-19: qty 28, 28d supply, fill #0

## 2019-03-19 MED ORDER — ONDANSETRON HCL 4 MG TABLET
ORAL_TABLET | Freq: Three times a day (TID) | ORAL | 0 refills | 4.00000 days | Status: CP | PRN
Start: 2019-03-19 — End: 2019-03-24
  Filled 2019-03-19: qty 10, 3d supply, fill #0

## 2019-03-19 MED ORDER — ASPIRIN 81 MG CHEWABLE TABLET
ORAL_TABLET | Freq: Every day | ORAL | 0 refills | 30.00000 days | Status: CP
Start: 2019-03-19 — End: 2019-04-18
  Filled 2019-03-19: qty 30, 30d supply, fill #0
  Filled 2019-03-19: qty 84, 14d supply, fill #0

## 2019-03-19 MED ORDER — OXYCODONE 5 MG TABLET
ORAL_TABLET | ORAL | 0 refills | 7 days | Status: CP | PRN
Start: 2019-03-19 — End: 2019-03-26
  Filled 2019-03-19: qty 40, 7d supply, fill #0

## 2019-03-19 MED FILL — GABAPENTIN 100 MG CAPSULE: 5 days supply | Qty: 15 | Fill #0 | Status: AC

## 2019-03-19 MED FILL — POLYETHYLENE GLYCOL 3350 17 GRAM/DOSE ORAL POWDER: 14 days supply | Qty: 238 | Fill #0 | Status: AC

## 2019-03-19 MED FILL — GERI-KOT 8.6 MG TABLET: 28 days supply | Qty: 28 | Fill #0 | Status: AC

## 2019-03-19 MED FILL — ASPIRIN 81 MG CHEWABLE TABLET: 30 days supply | Qty: 30 | Fill #0 | Status: AC

## 2019-03-19 MED FILL — ACETAMINOPHEN 500 MG TABLET: 14 days supply | Qty: 84 | Fill #0 | Status: AC

## 2019-03-19 MED FILL — ONDANSETRON HCL 4 MG TABLET: 3 days supply | Qty: 10 | Fill #0 | Status: AC

## 2019-03-19 MED FILL — OXYCODONE 5 MG TABLET: 7 days supply | Qty: 40 | Fill #0 | Status: AC

## 2019-03-19 NOTE — Unmapped (Signed)
Orthopaedic Surgery Operative Note (CSN: [id number])  Date of Surgery: 03/19/2019  Attending Physician: Esmeralda Links, MD    Diagnoses:   Pre-Op Diagnoses: Right shoulder rotator cuff tear - M75.101; Right shoulder biceps tendinitis  S46.211A    Post-Op Diagnosis: Same    Procedure(s)   Procedure:    R16 ARTHROSCOPY, SHOULDER, SURGICAL; WITH ROTATOR CUFF REPAIR  CPT(R) Code:  14782 - PR SHLDR ARTHROSCOP,SURG,W/ROTAT CUFF REPR    Procedure:    TENODESIS LONG TENDON BICEPS  CPT(R) Code:  95621 - PR REPAIR BICEPS LONG TENDON       Surgeons:  Primary: Gonzella Lex, MD  Resident - Assisting: Vela Prose, MD    Location: [facility] OR [facility] [place]  Anesthesia: Preop Block + General Anesthesia  Antibiotics: Ancef 2g  Tourniquet time: * No tourniquets in log *  Estimated Blood Loss: 10 mL  Complications: None  Specimens: None  Implants:  Implant Name Type Inv. Item Serial No. Manufacturer Lot No. LRB No. Used Action   ANCHOR HEALIX 4.5MM W/ORTHOCORD - [id number] Screws Pins Plates ANCHOR HEALIX 4.5MM W/ORTHOCORD  [employer] [id number] Right 1 Implanted   ANCHOR BIO-SWIVELOCK 4.75X19.1MM CLOSED EYELET VENTED - [id number] Ortho Implant ANCHOR BIO-SWIVELOCK 4.75X19.1MM CLOSED EYELET VENTED  [employer] [id number] Right 1 Implanted   Fast Thread BioComposite Interference Screw    [employer] [id number] Right 1 Implanted       Indications for Surgery:    Rodney Bender is a 49 y.o. male with Right shoulder rotator cuff tear following a rotator cuff tear repair - M75.101; Right shoulder biceps tendinitis  S46.211A.  Benefits and risks of operative and nonoperative management were discussed prior to surgery and informed consent form was completed.       Operative Findings:  Successful completion of the above procedure(s).    Operative shoulder EUA/ROM: 80 degrees abduction, 70 ER.   Glenohumeral compartment: No significant GH joint wear.   Root of biceps/Superior Labrum: Erythema of distal biceps tendon.   Biceps Tendon: Erythema of distal biceps tendon.   Rotator cuff: failure of prior rotator cuff repair with extension of tear laterally.     Procedure:   The patient was identified in the preoperative holding area where the surgical site was marked with indelible ink. The patient was identified and brought back to the operating room, where after a preprocedural timeout identify the correct patient, site, and procedure to be performed, the patient was transferred over to thebeachchair position on a tennant table   The patient received preoperative antibiotics per protocol.  Anesthesia was induced.  The extremity was prepped and draped in the usual sterile fashion.      A final timeout was then called.      We made a posterior portal through his prior incision and inserted the arthroscope into the glenohumeral joint.  Findings as noted above.   We used needle localization to create an anterior portal.   We probed the top of the labrum  We used a shaver to debride in the rotator interval    We noted the biceps was significantly inflamed and a tenotomy was performed.     We noted a supraspinatus articular sided tear. This was debrided.     We then withdrew the arthroscope and using the posterior portal inserted it into the sub-acromial space. We used needle localization to create a lateral portal.  We inserted the motorized shaver and the electrocautery to perform a bursectomy to improve visualization.  We then moved the arthroscope to the lateral position.  We used needle localization to create an anterior-lateral portal and placed a cannula there.  We also placed cannulas in the posterior and anterior portals.     We debrided the bursal side of the supraspinatus tear. We evaluated the footprint. The prior anchor was still in place. We then debrided down to fresh bone using a burr. We placed one anchor through the prior anchor using a tap. Using a spectrum suture passer and scorpion we  shuttled  multiple sutures from the posterior to the anterior, taking care to get full thickness passages of the tendon.  We tied the suture ends then placed a lateral swivel lock.     We made a 4 cm incision just lateral to the axilla.  We then dissected out the inferior border of the pectoralis, releasing fascia bluntly to mobilize the tendon.  We placed a retractor laterally and superior to pull the pectoralis out of the way.  We could see the biceps tendon. We released some fascia and pulled it out the anterior incision.  We used a suture, beginning about 25 mm proximal to the muscle tendon junction and placing a running stitch inferior to the muscle tendon junction, then superior.  We cut the superior portion of the biceps and resected it.  We placed a Hohman retractor medial and lateral, allowing [place] to see and feel the biceps groove.  We used a Cobb elevator to clear tissue.  We then placed a guide pin through the near cortex, over reaming with a 7 mm reamer.  We placed an Ar[employer] bio-tenodesis screw, locking the biceps in place.      The incisions were thoroughly irrigated.  The incisions were closed with 3-0 prolene suture in the skin.   A sterile dressing was applied along with a sling and a cooling type device.  The patient was awoken from general anesthesia and taken to the PACU in stable condition without complication.     Post-op Plan/Instructions:     The patient will be discharged home today.  See discharge instructions provided by Dr. Ivin Poot for information on weight bearing status, activity restrictions, and therapy plan.  Pain will be controlled with PRN???s. Follow-up in clinic in 10-14 days for planned suture removal.      Xrays at next clinic visit: none    Surgeon Notes: I was present and active for the entire procedure.    Date: 03/19/2019  Time: 11:53 AM       I was present for the entirety of the procedure(s). Cruz Condon, MD

## 2019-03-19 NOTE — Unmapped (Signed)
Nerve Block Discharge Instructions    General Considerations  ? Avoid placing cold, hot, hard or sharp items on your numb body part.  Also be careful to not rest your extremity on hard or hot/cold surfaces.  ? If you received a cooling device after surgery it is okay to use it.  ? Be careful not to bang or bump the numb body part.  Try to keep it in a neutral, comfortable position.  ? Elevate the limb; this will help decrease swelling and help increase comfort when the block wears off.  ? You may regain some movement or feeling, or experience some breakthrough pain, when the initial strong numbing medicine wears off.  This is normal.  ? Take your prescribed pain medicine as soon as you start feeling any discomfort in the extremity.  ? You should take your prescribed pain medicine BEFORE you go to bed, even if you are not experiencing discomfort.  If you have any questions or issues regarding the nerve block please call the anesthesia acute pain resident on call at [phone].    Upper Extremity Nerve Blocks  ? Be aware of the position of your arm and protect it from compression or other injury.  ? Ensure that your entire arm is supported, including the wrist.  ? Do not let the wrist dangle over the sling.  ? There are some possible side effects that you may experience with your particular block.  These side effects will resolve as your block wears off.   - hoarse voice - slight redness of the eye   - sagging eyelid - some mild shortness of breath (especially if lying down)   - smaller pupil    Lower Extremity Nerve Blocks  ? Be aware of the position of your leg and protect it from compression or other injury.  ? Pad the knee, ankle and heel.  ? Do NOT bear any weight on the numb extremity until you completely recover the feeling and full muscle strength.  ? Always have assistance when getting up and moving about.    Follow up Questionnaire:  A member of the regional anesthesia team will be contacting you in a day or two for follow up on your block.  Listed below are the questions they will be asking you.  Please use this sheet to help you remember the time sequence of the following events.    1. What time did you start having pain as the block wore off? _______________  2. What time were you first able to move your fingers/toes? ________________  3. Are you still experiencing any numbness in your blocked extremity? ________

## 2019-03-22 ENCOUNTER — Telehealth: Payer: Self-pay | Admitting: *Deleted

## 2019-03-22 NOTE — Telephone Encounter (Signed)
Noted  

## 2019-03-22 NOTE — Unmapped (Signed)
Addendum  created 03/22/19 1451 by Paulla Fore, MD    Clinical Note Signed

## 2019-03-24 MED ORDER — OXYCODONE 5 MG TABLET
ORAL_TABLET | Freq: Four times a day (QID) | ORAL | 0 refills | 8.00000 days | Status: CP | PRN
Start: 2019-03-24 — End: 2019-03-29

## 2019-03-29 MED ORDER — OXYCODONE 5 MG TABLET
ORAL_TABLET | Freq: Four times a day (QID) | ORAL | 0 refills | 8.00000 days | Status: CP | PRN
Start: 2019-03-29 — End: 2019-04-03

## 2019-03-31 ENCOUNTER — Telehealth: Admit: 2019-03-31 | Discharge: 2019-04-01 | Payer: MEDICARE | Attending: Clinical | Primary: Clinical

## 2019-03-31 MED ORDER — OXYCODONE 5 MG TABLET
ORAL_TABLET | Freq: Four times a day (QID) | ORAL | 0 refills | 4 days | Status: CP | PRN
Start: 2019-03-31 — End: 2019-04-05
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
## 2019-03-31 NOTE — Unmapped (Signed)
Internal Medicine Counseling Program      Self-Care Prescription Goals    Goals until next visit:     Today we discussed your treatment goal of:  trying to see if this is going to work sleep better, stress management and self-care management       Today you set step(s) below for yourself to work towards this goal.      Pleasant Activity: to think about an activity    Physical Activity: to think about an activity        Problem Solving weekly goal reminder:  ?? Focus on the present and what's within your control   oTasks and problems - pick the smallest thing you can do that will make you feel good for the effort. Focus only on what's within your control  ?? Pleasant activity 1-2 times a week  ?? Physical activity 3 times a week, within what's physically comfortable      Jennell Corner, LCSW  Direct line:   [phone]  To schedule call the main clinic number at [phone] and ask for Rockney Ghee  [facility] after hours advice nurse consult #:  [phone]      If you have thoughts about hurting yourself, get help immediately:   Call 911 or go to the emergency room of your local hospital   [facility] local 24 hour / mobile crisis #:  1 (800) L7555294. See below for a listing of other county's 24 hour numbers.  [facility] Crisis Line  ([phone] emergency) #:  [phone]  Call the National Suicide Hotline #: 269-528-5586  [facility]:  249 675 4945         Linkage to Local Services and 24 Hour Crisis / Mobile Crisis Numbers by [employer] will serve anyone who is having a non-911 emergency. Phone numbers can also be used to locate counseling and psychiatric services for people who have Medicaid or for those with low or no income.     [facility] 24 hour Crisis Line: [phone]  Counties Served: [place], [place], [place], [place]    [employer] 24 hour Crisis Line:1-7097312593  [place] Served: [place], [place], [place], [place], [place], [place], [place], [place], [place], [place], [place], [place], [place], [place], [place], [place], [place], [place], [place] and [employer] [facility] 24 hour Crisis Line: [phone]   [place] Served: Lyn Hollingshead, Scientist, physiological, [place], [place], [place], [place], [place], [place], [place], [place], [place], [place], [place], [place], [place], [place], [place], [place], [place], [place], [place], Sherlon Handing    [facility] 24 hour Crisis Line: [phone]  [place]: Ebbie Latus, [place], [place], [place], [place], Ena Dawley, [facility]    [facility] 24 hour Crisis Line: [phone]  Counties Served: [place], [place], [place], [place], [place], [place]att [place], [employer] 24 hour Crisis Line: [phone]  [place]: [place], [place], [place], [place], [place], [place], [place], [place], [place], [place], [place], [place], [place], [place], [place], [place], [place], [place], [place], [place], [place], [place], [place], [place], [place]    [facility] 24 hour Crisis Line: [phone]  [place] Served: [place], [place], [place], [place], [place], [place], [place], [place], [place], [place], [place]rey Camp[place]

## 2019-04-01 ENCOUNTER — Other Ambulatory Visit: Payer: Self-pay

## 2019-04-01 ENCOUNTER — Ambulatory Visit: Payer: Medicare Other | Attending: Anesthesiology | Admitting: Anesthesiology

## 2019-04-01 ENCOUNTER — Encounter: Payer: Self-pay | Admitting: Anesthesiology

## 2019-04-01 DIAGNOSIS — G8929 Other chronic pain: Secondary | ICD-10-CM

## 2019-04-01 DIAGNOSIS — G894 Chronic pain syndrome: Secondary | ICD-10-CM | POA: Diagnosis not present

## 2019-04-01 DIAGNOSIS — K5090 Crohn's disease, unspecified, without complications: Secondary | ICD-10-CM

## 2019-04-01 DIAGNOSIS — M25511 Pain in right shoulder: Secondary | ICD-10-CM

## 2019-04-01 DIAGNOSIS — R109 Unspecified abdominal pain: Secondary | ICD-10-CM | POA: Diagnosis not present

## 2019-04-01 DIAGNOSIS — M542 Cervicalgia: Secondary | ICD-10-CM

## 2019-04-01 DIAGNOSIS — F1190 Opioid use, unspecified, uncomplicated: Secondary | ICD-10-CM | POA: Diagnosis not present

## 2019-04-01 DIAGNOSIS — M47816 Spondylosis without myelopathy or radiculopathy, lumbar region: Secondary | ICD-10-CM

## 2019-04-01 MED ORDER — GABAPENTIN 100 MG PO CAPS: 100.0000 mg | ORAL_CAPSULE | Freq: Every day | ORAL | 3 refills | Status: AC

## 2019-04-01 MED ORDER — OXYCODONE-ACETAMINOPHEN 10-325 MG PO TABS: 1.0000 | ORAL_TABLET | Freq: Three times a day (TID) | ORAL | 0 refills | Status: AC | PRN

## 2019-04-01 MED ORDER — GABAPENTIN 100 MG CAPSULE
ORAL | 0 days
Start: 2019-04-01 — End: ?

## 2019-04-02 ENCOUNTER — Encounter: Payer: Self-pay | Admitting: Anesthesiology

## 2019-04-02 NOTE — Progress Notes (Signed)
Virtual Visit via Telephone Note  I connected with AMAD MAU on 04/02/19 at  1:45 PM EST by telephone and verified that I am speaking with the correct person using two identifiers.  Location: Patient: Home Provider: Pain control center   I discussed the limitations, risks, security and privacy concerns of performing an evaluation and management service by telephone and the availability of in person appointments. I also discussed with the patient that there may be a patient responsible charge related to this service. The patient expressed understanding and agreed to proceed.   History of Present Illness: I spoke with Mr. Dombek via telephone today he was unable to do the video portion of the virtual conference.  He reports that his low back pain and intermittent neck pain have been stable in nature.  He is staying active and doing his physical therapy and taking his medications for his low back pain and abdominal pain.  He is medicines continue work effectively for him.  No changes are reported and his medications continue to enable him to stay functional sleep well at night and stay active.  Otherwise he is in his usual state of health with no new changes reported today.    Observations/Objective:  Current Outpatient Medications:  .  amlodipine-atorvastatin (CADUET) 10-10 MG tablet, Take 1 tablet by mouth daily., Disp: , Rfl:  .  dronabinol (MARINOL) 10 MG capsule, Take 10 mg by mouth 2 (two) times daily before a meal., Disp: , Rfl:  .  famotidine (PEPCID) 20 MG tablet, Take 20 mg by mouth 2 (two) times daily., Disp: , Rfl:  .  gabapentin (NEURONTIN) 100 MG capsule, Take 1 capsule (100 mg total) by mouth at bedtime., Disp: 30 capsule, Rfl: 3 .  hyoscyamine (LEVSIN, ANASPAZ) 0.125 MG tablet, Take 0.125 mg by mouth as needed. , Disp: , Rfl:  .  naloxone (NARCAN) nasal spray 4 mg/0.1 mL, For respiratory depression from opioids, Disp: 1 kit, Rfl: 2 .  oxyCODONE-acetaminophen (PERCOCET)  10-325 MG tablet, Take 1 tablet by mouth every 8 (eight) hours as needed for pain., Disp: 75 tablet, Rfl: 0 .  [START ON 05/01/2019] oxyCODONE-acetaminophen (PERCOCET) 10-325 MG tablet, Take 1 tablet by mouth every 8 (eight) hours as needed for pain., Disp: 75 tablet, Rfl: 0 .  prochlorperazine (COMPAZINE) 5 MG tablet, Take 5 mg by mouth every 8 (eight) hours as needed., Disp: , Rfl:  .  STELARA 90 MG/ML SOSY injection, Inject 90 mg as directed., Disp: , Rfl:   Assessment and Plan: 1. Chronic abdominal pain   2. Chronic pain syndrome   3. Chronic, continuous use of opioids   4. Facet arthritis of lumbar region   5. Cervicalgia   6. Crohn's disease without complication, unspecified gastrointestinal tract location ([place])   7. Acute pain of right shoulder   Based on our discussion today I think it is appropriate to refill his medications for the next 2 months.  We will also schedule him for a routine urine drug screen as requested today.  Want him to continue with his current exercise activity with stretching strengthening modalities.  We will schedule him for 101-monthreturn and I want him to continue follow-up with his primary care physicians and his gastroenterologist.  If he should have any problems with his pain management in the meantime he is instructed to contact [place] control [place].   Follow Up Instructions:    I discussed the assessment and treatment plan with the patient. The  patient was provided an opportunity to ask questions and all were answered. The patient agreed with the plan and demonstrated an understanding of the instructions.   The patient was advised to call back or seek an in-person evaluation if the symptoms worsen or if the condition fails to improve as anticipated.  I provided 30 minutes of non-face-to-face time during this encounter.   [name], MD

## 2019-04-08 ENCOUNTER — Ambulatory Visit: Admit: 2019-04-08 | Discharge: 2019-04-09 | Payer: MEDICARE

## 2019-04-08 MED ORDER — OXYCODONE 5 MG TABLET
ORAL_TABLET | Freq: Four times a day (QID) | ORAL | 0 refills | 4 days | Status: CP | PRN
Start: 2019-04-08 — End: 2019-04-13

## 2019-04-08 NOTE — Unmapped (Signed)
ORTHOPAEDIC POSTOPERATIVE CLINIC NOTE     ASSESSMENT:  Rodney Bender is a 49 y.o. male status post right shoulder revision rotator cuff repair, biceps tenodesis on 03/19/2019.      PLAN:  Sutures were removed and steri strips placed.  Operative findings were discussed with the patient and the expected postoperative course was outlined. He was provided a prescription for physical therapy with appropriate protocol for him to begin in approximately one week-internal referral was sent to [facility] where he was working with therapy preoperatively.  Additionally we have refilled his oxycodone.. All questions were answered and they were amenable with this plan.    - Follow-up plan: 4 weeks.  - X-rays/plan at next visit: none.    SUBJECTIVE:  No complaints.  Overall patient is doing all right after surgery.  He has significant soreness in the right shoulder.  He denies any new injuries.  He has no issues with the incisions including drainage, erythema or swelling.    OBJECTIVE:  General Appearance ?? well-nourished and no acute distress   Mood and Affect ?? alert, cooperative and pleasant   Cardiovascular ?? well-perfused distally   Sensation ?? Sensation intact to light touch distally   MUSCULOSKELETAL    Shoulder (right) ?? Incisions appear benign and healing appropriately.  ?? Tolerates gentle range of motion in forward extension, external rotation, abduction       Past Surgical History:   Procedure Laterality Date   ??? COLON SURGERY     ??? PR COLONOSCOPY FLX DX W/COLLJ SPEC WHEN PFRMD Left 11/10/2012    Procedure: COLONOSCOPY, FLEXIBLE, PROXIMAL TO SPLENIC FLEXURE; DIAGNOSTIC, W/WO COLLECTION SPECIMEN BY BRUSH OR WASH;  Surgeon: Malcolm Metro, MD;  [facility]: [facility]URES [facility];  Service: Gastroenterology   ??? PR COLONOSCOPY FLX DX W/COLLJ SPEC WHEN PFRMD N/A 07/21/2013    Procedure: COLONOSCOPY, FLEXIBLE, PROXIMAL TO SPLENIC FLEXURE; DIAGNOSTIC, W/WO COLLECTION SPECIMEN BY BRUSH OR WASH;  Surgeon: Gwen Pounds, MD;  Location: GI [facility]URES [facility];  Service: Gastroenterology   ??? PR COLONOSCOPY FLX DX W/COLLJ SPEC WHEN PFRMD N/A 08/09/2016    Procedure: COLONOSCOPY, FLEXIBLE, PROXIMAL TO SPLENIC FLEXURE; DIAGNOSTIC, W/WO COLLECTION SPECIMEN BY BRUSH OR WASH;  Surgeon: Janyth Pupa, MD;  Location: GI PROC[facility]URES [facility];  Service: Gastroenterology   ??? PR COLONOSCOPY FLX DX W/COLLJ SPEC WHEN PFRMD N/A 04/06/2018    Procedure: COLONOSCOPY, FLEXIBLE, PROXIMAL TO SPLENIC FLEXURE; DIAGNOSTIC, W/WO COLLECTION SPECIMEN BY BRUSH OR WASH;  Surgeon: Zetta Bills, MD;  [facility];  Service: Gastroenterology   ??? PR COLONOSCOPY W/BIOPSY SINGLE/MULTIPLE  07/23/2012    Procedure: COLONOSCOPY, FLEXIBLE, PROXIMAL TO SPLENIC FLEXURE; WITH BIOPSY, SINGLE OR MULTIPLE;  Surgeon: Vickii Chafe, MD;  Location: [facility]URES [facility];  Service: Gastroenterology   ??? PR COLONOSCOPY W/BIOPSY SINGLE/MULTIPLE N/A 07/15/2014    Procedure: COLONOSCOPY, FLEXIBLE, PROXIMAL TO SPLENIC FLEXURE; WITH BIOPSY, SINGLE OR MULTIPLE;  Surgeon: Janyth Pupa, MD;  Location: GI PROC[facility]URES [facility];  Service: Gastroenterology   ??? PR COLONOSCOPY W/BIOPSY SINGLE/MULTIPLE N/A 03/21/2017    Procedure: COLONOSCOPY, FLEXIBLE, PROXIMAL TO SPLENIC FLEXURE; WITH BIOPSY, SINGLE OR MULTIPLE;  Surgeon: Modena Nunnery, MD;  [facility];  Service: Gastroenterology   ??? PR COLONOSCOPY W/BIOPSY SINGLE/MULTIPLE N/A 02/12/2019    Procedure: COLONOSCOPY, FLEXIBLE, PROXIMAL TO SPLENIC FLEXURE; WITH BIOPSY, SINGLE OR MULTIPLE;  Surgeon: Jules Husbands, MD;  [facility]: [facility]URES [facility];  Service: Gastroenterology   ??? PR COLSC FLEXIBLE  W/TRANSENDOSCOPIC BALLOON DILAT N/A 07/15/2014    Procedure: COLONOSCOPY, FLEXIBLE; WITH DILATION BY BALLOON, 1 OR MORE STRICTURES;  Surgeon: Janyth Pupa, MD;  Location: [facility]  Service: Gastroenterology   ??? PR REMVL COLON & TERM ILEUM W/ILEOCOLOSTOMY N/A 09/16/2016    Procedure: COLECTOMY, PARTIAL, WITH REMOVAL OF TERMINAL ILEUM WITH ILEOCOLOSTOMY;  Surgeon: Lady Gary, MD;  Location: MAIN OR [facility];  Service: Gastrointestinal   ??? PR REPAIR BICEPS LONG TENDON Right 03/19/2019    Procedure: TENODESIS LONG TENDON BICEPS;  Surgeon: Gonzella Lex, MD;  Location: ASC OR [facility];  Service: Orthopedics   ??? PR SHLDR ARTHROSCOP,PART ACROMIOPLAS Right 10/02/2018    Procedure: R16 ARTHROSCOPY, SHOULDER, SURGICAL; DECOMPRESS SUBACROMIAL SPACE W/PART ACROMIOPLASTY, Tamala Bari;  Surgeon: Gonzella Lex, MD;  Location: ASC OR [facility];  Service: Orthopedics   ??? PR SHLDR ARTHROSCOP,SURG,W/ROTAT CUFF REPR Right 10/02/2018    Procedure: ARTHROSCOPY, SHOULDER, SURGICAL; WITH ROTATOR CUFF REPAIR;  Surgeon: Gonzella Lex, MD;  Location: ASC OR [place];  Service: Orthopedics   ??? PR SHLDR ARTHROSCOP,SURG,W/ROTAT CUFF REPR Right 03/19/2019    Procedure: R16 ARTHROSCOPY, SHOULDER, SURGICAL; WITH ROTATOR CUFF REPAIR;  Surgeon: Gonzella Lex, MD;  Location: ASC OR [facility];  Service: Orthopedics   ??? PR UPPER GI ENDOSCOPY,BIOPSY N/A 07/23/2012    Procedure: UGI ENDOSCOPY; WITH BIOPSY, SINGLE OR MULTIPLE;  Surgeon: Vickii Chafe, MD;  Location: GI PROCEDURES [facility];  Service: Gastroenterology   ??? PR UPPER GI ENDOSCOPY,DIAGNOSIS N/A 11/10/2012    Procedure: UGI ENDO, INCLUDE ESOPHAGUS, STOMACH, & DUODENUM &/OR JEJUNUM; DX W/WO COLLECTION SPECIMN, BY BRUSH OR WASH;  Surgeon: Malcolm Metro, MD;  Location: GI PROCEDURES [facility];  Service: Gastroenterology   ??? PR UPPER GI ENDOSCOPY,DIAGNOSIS N/A 07/21/2013    Procedure: UGI ENDO, INCLUDE ESOPHAGUS, STOMACH, & DUODENUM &/OR JEJUNUM; DX W/WO COLLECTION SPECIMN, BY BRUSH OR WASH;  Surgeon: Gwen Pounds, MD;  Location: GI PROCEDURES [facility];  Service: Gastroenterology   ??? PR UPPER GI ENDOSCOPY,DIAGNOSIS N/A 07/15/2014    Procedure: UGI ENDO, INCLUDE ESOPHAGUS, STOMACH, & DUODENUM &/OR JEJUNUM; DX W/WO COLLECTION SPECIMN, BY BRUSH OR WASH;  Surgeon: Janyth Pupa, MD;  [facility]: [facility]URES [facility];  Service: Gastroenterology   ??? ROTATOR CUFF REPAIR Right 2020   ??? SPINE SURGERY

## 2019-04-08 NOTE — Unmapped (Signed)
I saw and evaluated the patient, participating in the key portions of the service.  I reviewed the resident???s note.  I agree with the resident???s findings and plan. Cruz Condon, MD

## 2019-04-12 DIAGNOSIS — S46011D Strain of muscle(s) and tendon(s) of the rotator cuff of right shoulder, subsequent encounter: Principal | ICD-10-CM

## 2019-04-13 NOTE — Unmapped (Deleted)
Internal Medicine Clinic Visit    Reason for visit: ***    A/P:    ***    There are no diagnoses linked to this encounter.    No follow-ups on file.    Staffed with ***    __________________________________________________________    HPI:    Anxiety  GAD7  PHQ9    Crohns    Rotator Cuff  __________________________________________________________    Problem List:  Patient Active Problem List   Diagnosis   ??? Tobacco use disorder   ??? Chronic nausea   ??? Intestinal bypass or anastomosis status   ??? Mixed, or nondependent drug abuse, in remission (CMS-HCC)   ??? Crohn's disease of both small and large intestine with other complication (CMS-HCC)   ??? Chronic abdominal pain   ??? Rotator cuff syndrome of right shoulder   ??? Abdominal pain   ??? Traumatic partial tear of right biceps tendon   ??? Hyponatremia   ??? Hyperkalemia   ??? Anxiety       Medications:  Reviewed in EPIC  __________________________________________________________    Physical Exam:   Vital Signs:  There were no vitals filed for this visit.    Gen: Well appearing, NAD  CV: RRR, no murmurs  Pulm: CTA bilaterally, no crackles or wheezes  Abd: Soft, NTND, normal BS. No HSM.  Ext: No edema  ***      Medication adherence and barriers to the treatment plan have been addressed. Opportunities to optimize healthy behaviors have been discussed. Patient / caregiver voiced understanding.

## 2019-04-14 ENCOUNTER — Ambulatory Visit
Admit: 2019-04-14 | Discharge: 2019-04-20 | Payer: MEDICARE | Attending: Rehabilitative and Restorative Service Providers" | Primary: Rehabilitative and Restorative Service Providers"

## 2019-04-14 NOTE — Unmapped (Deleted)
Erroneous note.  Norwood Levo, PT, DPT
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
## 2019-04-15 NOTE — Unmapped (Signed)
[facility] Refill Coordination Note    Specialty Medication(s) to be Shipped:   Inflammatory Disorders: Stelara    Other medication(s) to be shipped:      Rodney Bender [place], DOB: 05-May-1970  Phone: [phone] (home)       All above HIPAA information was verified with patient.     Was a Nurse, learning disability used for this call? No    Completed refill call assessment today to schedule patient's medication shipment from the [facility] [phone]).       Specialty medication(s) and dose(s) confirmed: Regimen is correct and unchanged.   Changes to medications: Mando reports no changes at this time.  Changes to insurance: No  Questions for the pharmacist: No    Confirmed patient received Welcome Packet with first shipment. The patient will receive a drug information handout for each medication shipped and additional FDA Medication Guides as required.       DISEASE/MEDICATION-SPECIFIC INFORMATION        For patients on injectable medications: Patient currently has 0 doses left.  Next injection is scheduled for 3/27.    SPECIALTY MEDICATION ADHERENCE     Medication Adherence    Patient reported X missed doses in the last month: 0  Specialty Medication: STELARA 90MG /ML  Patient is on additional specialty medications: No  Informant: patient  Confirmed plan for next specialty medication refill: delivery by pharmacy  Refills needed for supportive medications: not needed          Refill Coordination    Has the Patients' Contact Information Changed: No  Is the Shipping Address Different: No           STELARA 90 mg/ml: 8 days of medicine on hand          SHIPPING     Shipping address confirmed in Epic.     Delivery Scheduled: Yes, Expected medication delivery date: 3/23.     Medication will be delivered via Same Day Courier to the prescription address in Epic WAM.    Jolene Schimke   [facility] Specialty Technician

## 2019-04-16 MED ORDER — OXYCODONE-ACETAMINOPHEN 10 MG-325 MG TABLET
ORAL_TABLET | Freq: Three times a day (TID) | ORAL | 0 refills | 4.00000 days | Status: CP | PRN
Start: 2019-04-16 — End: 2019-06-15

## 2019-04-16 MED ORDER — NAPROXEN 500 MG TABLET
ORAL_TABLET | Freq: Two times a day (BID) | ORAL | 1 refills | 20.00000 days | Status: CP
Start: 2019-04-16 — End: 2019-04-30

## 2019-04-20 ENCOUNTER — Telehealth: Admit: 2019-04-20 | Discharge: 2019-04-21 | Payer: MEDICARE | Attending: Clinical | Primary: Clinical

## 2019-04-20 DIAGNOSIS — K50812 Crohn's disease of both small and large intestine with intestinal obstruction: Principal | ICD-10-CM

## 2019-04-20 MED FILL — STELARA 90 MG/ML SUBCUTANEOUS SYRINGE: 56 days supply | Qty: 1 | Fill #4 | Status: AC

## 2019-04-20 MED FILL — STELARA 90 MG/ML SUBCUTANEOUS SYRINGE: SUBCUTANEOUS | 56 days supply | Qty: 1 | Fill #4

## 2019-04-20 NOTE — Unmapped (Addendum)
Grandville Silos,    I hope this message finds you well. I wanted to reach out to you and share some information regarding counseling services available to you. As discussed in our session today, you would benefit from a different mental health support setting than the one [facility] program can provide.      I wanted to let you know you can find options for counseling services that accept your health insurance at [email or web address]. I have included a few options from this website below      ?? [employer], [facility]  Telephone: [phone]   First appointment is in person, subsequent appointments can be virtual  [place], [place]    ?? Ival Bible, MA, MSW, LCSW, CEAP, DAPA  Telephone: [phone]  Location: [place], [place]      ?? [facility], [facility]  Telephone: [phone]  Location: [place], [place]        Please let me know if you have questions about the above or if you have trouble getting scheduled with them. Feel free to reply to this message or call me and leave a voicemail.    Regards,    Jennell Corner, MSW, LCSW  [facility] Manager- [facility]  Phone: [phone]

## 2019-04-20 NOTE — Unmapped (Signed)
INTERNAL MEDICINE COUNSELING SESSION NOTE    LENGTH OF SESSION:  Psychotherapy Session 15 minutes    TYPE OF PSYCHOTHERAPY: Behavioral, Supportive    REASON FOR TREATMENT: Problem Solving Treatment and Mind Body Stress Reduction skill training for anxiety; link impact of effort to mood.         I spent 15 minutes on the real-time audio and video with the patient on the date of service. I spent an additional 3 minutes on pre- and post-visit activities on the date of service.     The patient was physically located in [place] or a state in which I am permitted to provide care. The patient and/or parent/guardian understood that s/he may incur co-pays and cost sharing, and agreed to the telemedicine visit. The visit was reasonable and appropriate under the circumstances given the patient's presentation at the time.    The patient and/or parent/guardian has been advised of the potential risks and limitations of this mode of treatment (including, but not limited to, the absence of in-person examination) and has agreed to be treated using telemedicine. The patient's/patient's family's questions regarding telemedicine have been answered.     If the visit was completed in an ambulatory setting, the patient and/or parent/guardian has also been advised to contact their provider???s office for worsening conditions, and seek emergency medical treatment and/or call 911 if the patient deems either necessary.             REVIEW OF FUNCTIONING SINCE LAST VISIT    MOOD (0-10) =  OK  6 of 10 with 10 being the  best.     SATISFACTION WITH EFFORT IN CARING FOR MOOD (0-10) =   10 of 10 with 10 being the  best.     PAIN:     8  of 10 with 10 being the worst.  At last visit was    5    PHQ-9 SCORE:     8   At last visit was  10    GAD-7 SCORE:     7  At last visit was    13      CURRENT SUICIDAL/HOMICIDAL IDEATION:   Denies SI / HI      PROBLEM LIST:    Patient Active Problem List   Diagnosis   ??? Tobacco use disorder   ??? Chronic nausea   ??? Intestinal bypass or anastomosis status   ??? Mixed, or nondependent drug abuse, in remission (CMS-HCC)   ??? Crohn's disease of both small and large intestine with other complication (CMS-HCC)   ??? Chronic abdominal pain   ??? Rotator cuff syndrome of right shoulder   ??? Abdominal pain   ??? Traumatic partial tear of right biceps tendon   ??? Hyponatremia   ??? Hyperkalemia   ??? Anxiety         MEDICATION COMPLIANCE:    Taking all medications regularly?  yes     Any changes in medication since last visit?  no     Any missed doses? no       Current Outpatient Medications on File Prior to Visit   Medication Sig Dispense Refill   ??? [EXPIRED] acetaminophen (TYLENOL) 500 MG tablet Take 2 tablets (1,000 mg total) by mouth every eight (8) hours as needed for pain for up to 5 days. 84 tablet 0   ??? aspirin 81 MG chewable tablet Chew 1 tablet (81 mg total) daily. 30 tablet 0   ??? citalopram (CELEXA) 10 MG tablet  Take 1 tablet (10 mg total) by mouth daily. (Patient not taking: Reported on 03/19/2019) 90 tablet 3   ??? dronabinoL (MARINOL) 10 MG capsule Take 1 capsule (10 mg total) by mouth Two (2) times a day (30 minutes before a meal). 60 capsule 0   ??? gabapentin (NEURONTIN) 100 MG capsule Take 1 capsule (100 mg total) by mouth Three (3) times a day for 5 days. 15 capsule 0   ??? gabapentin (NEURONTIN) 100 MG capsule Take 100 mg by mouth.     ??? gabapentin (NEURONTIN) 300 MG capsule Take 300 mg by mouth Three (3) times a day. Takes as needed     ??? 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 naproxen (NAPROSYN) 500 MG tablet Take 1 tablet (500 mg total) by mouth 2 (two) times a day with meals for 14 days. Take one pill 2x day by mouth for 14 days.  Then convert to one pill 2x day on an as needed basis.  Discontinue all other NSAID medications. 40 tablet 1   ??? OLANZapine (ZYPREXA) 2.5 MG tablet Take 1 tablet (2.5 mg total) by mouth nightly. Take nightly for nausea. Can stop as nausea improves. 30 tablet 0   ??? [EXPIRED] ondansetron (ZOFRAN) 4 MG tablet Dissolve 1 tablet (4 mg total) on the tongue every eight (8) hours as needed for nausea for up to 5 days. 10 tablet 0   ??? [EXPIRED] oxyCODONE (ROXICODONE) 5 MG immediate release tablet Take 1-2 tablets (5-10 mg total) by mouth every six (6) hours as needed for pain for up to 5 days. 30 tablet 0   ??? [EXPIRED] oxyCODONE (ROXICODONE) 5 MG immediate release tablet Take 1-2 tablets (5-10 mg total) by mouth every six (6) hours as needed for pain for up to 5 days. 30 tablet 0   ??? oxyCODONE-acetaminophen (PERCOCET) 10-325 mg per tablet Take 1 tablet by mouth every eight (8) hours as needed for pain. 10 tablet 0   ??? [EXPIRED] polyethylene glycol (MIRALAX) 17 gram/dose powder Mix 1 capful (17 g) in 4 to 8 ounces of water,tea,soda,coffee, or juice and drink daily as needed for up to 5 days. 238 g 0   ??? [EXPIRED] senna (SENNA) 8.6 mg tablet Take 1 tablet by mouth daily as needed for constipation for up to 5 days. 28 tablet 0   ??? sildenafil (VIAGRA) 50 MG tablet Take 50 mg by mouth daily as needed for erectile dysfunction.     ??? therapeutic multivitamin (THERAGRAN) tablet Take 1 tablet by mouth daily.     ??? ustekinumab (STELARA) 90 mg/mL Syrg syringe Inject the contents of 1 syringe (90 mg total) under the skin every 8 weeks. 1 mL 5   ??? [DISCONTINUED] dronabinoL (MARINOL) 10 MG capsule TAKE 1 CAPSULE BY MOUTH TWICE DAILY BEFORE A MEAL 60 capsule 0     No current facility-administered medications on file prior to visit.            SLEEPING:     4hrs at night, not sleeping during the day    At assessment:  EARLY MORNING AWAKENING, FREQUENT AWAKENINGS, DIFFICULTY WITH INITATION, ENDORSES CAFFEINE INTAKE CLOSE TO BEDTIME, DENIES DAYTIME NAPS, ENDORSES DAYTIME NAPS and DENIES SLEEP AID USE  # of hours of sleep: last night slept about 4 hrs  # of hours in bed: 3 or 4  Watches TV, uses electronics other activities in bed: yes, watches TV and uses phone  APPETITE:    it's alright 1-2 meals per day    At assessment:  NO CHANGE  Caffeine: drinks a soda around noon with lunch  Interests:  Likes to play video games. On a normal day he describes he gets up around 11am, I hang out, plays with dogs, sometimes takes dogs out for a walk  ??        SUBSTANCE USE:  no     At assessment:  ETOH:   Denies  Illicit:   DENIES  Licit:   DENIES  Denies Cigarette smoking   Denies  Use of electronic or  other tobacco products        SESSION FOCUS:  Addressed:  1. Anxiety        First visit since assessment. I provided orientation to counseling session format and week review.    Discussed efforts since last contact.      living day by day                Today's Problem Solving addressed:  I provided information about and discussed three components of problem solving including: a focus on the present ??? setting goals to do the smallest thing that patient will feel good about for the effort; pleasant and physical activity as able.      Reviewed patient's efforts and linked impact of effort to   1. Anxiety        Addressed:    Discussed PST intervention with pt and educated about intervention being goal focused, encouraged pt to identify a pleasant activity and/or physical activity but pt declined to talk or identify any activities    Discussed about expectations for counseling, pt stated I want to feel better but declined to talk or share anything about himself, answered with short yes or no answers.    Talked about other counseling settings that he might benefit from as he declined to identify any goals, pt receptive to receiving in the mail counseling options for continuity counseling in the community. Pt confirmed address on file is up to date.                      Additional:       Medication adherence and barriers to the treatment plan have been addressed. Opportunities to optimize healthy behaviors have been discussed. Patient / caregiver voiced understanding.        ASSESSMENT ENGAGEMENT: Patient presented resistance to participate in treatment, declined to talk or answer questions with more than a yes or no  PARTICIPATION QUALITY:    Resistant and Responsive Only  MOOD:  UPSET   Pt rolled his eyes when PHQ9 and GAD7 questionnaires were started, was laying on his bed during session, declined to sit up or go to a chair or sit on his bed  AFFECT:  ANGRY   MENTAL STATUS:     alert, oriented and depressed  MODES OF INTERVENTION used in this session:    Clarification, Education, Linkage facilitation, Week review and Treatment review      TREATMENT PLAN    Recommended counseling in the community as current goal-focused PST intervention doesn't meet pt's counseling needs at this time    MyChart message is being sent and letter is being mailed including information for counseling options accepting Medicare patients.    Note to be copied to PCP with update about treatment plan

## 2019-04-21 ENCOUNTER — Ambulatory Visit
Admit: 2019-04-21 | Discharge: 2019-05-20 | Payer: MEDICARE | Attending: Rehabilitative and Restorative Service Providers" | Primary: Rehabilitative and Restorative Service Providers"

## 2019-04-21 NOTE — Unmapped (Signed)
[facility] PT [facility] [place]  OUTPATIENT PHYSICAL THERAPY  04/21/2019  Note Type: Treatment Note       Patient Name: Rodney [place]  Date of Birth:06-08-1970  Diagnosis:   Encounter Diagnoses   Name Primary?   ??? Traumatic tear of right rotator cuff, unspecified tear extent, subsequent encounter Yes   ??? Chronic right shoulder pain    ??? Post-operative state      Referring MD:  Gonzella Lex, MD   Plan of Care Effective Date: 04/14/19 - 08/14/19  Visit #: 2      Assessment & Plan     Assessment details: Pt presents for first follow-up s/p right shoulder revision rotator cuff repair and biceps tenodesis on 03/19/2019.  Original rotator cuff repair surgery was on 10/02/18. He remains pain dominant but was able to tolerate today's treatment session notably better than initial eval.  PROM into ER and flexion today improved to 20 and 90 degrees respectively.  Pt will benefit from skilled PT services to address above impairments.       Impairments: decreased range of motion, decreased strength, pain and postural weakness        Personal Factors/Comorbidities: 3+  Specific Comorbidities: hx of previous R RTC repair, COVID-19 pandemic, anxiety, Crohn's  Examination of Body Systems: 4+ elements  Body System: posture, ROM, sensation, activity limitations, participation restrictions   Clinical Presentation: stable  Clinical Decision Making: low    Prognosis: fair      Negative Prognosis Rationale: Pain Status, chronicity of condition and severity of symptoms.        Therapy Goals  Goals: In 16 weeks:  1. Pt will be independent with home exercise program  2. Pt will demonstrate R shld AROM WFL in all planes to allow full mobility for bathing and dressing tasks.  3. Pt will be able to return to fishing without being limited by shoulder pain.  4. Pt will report 4/10 pain or less at all times to enable performance of daily activities with less pain.    Plan  Therapy options: will be seen for skilled physical therapy services Planned therapy interventions: manual therapy, neuromuscular re-education, postural training, body mechanics training, education - patient, therapeutic exercises and home exercise program        Frequency: 1-2x per week.    Duration in weeks: up to 16 weeks    Education provided to: patient.  Education provided: importance of Therapy and HEP  Education Results: verbalized good understanding and needs reinforcement.  Communication/Consultation: Medicare Cert/POC sent to Referring Provider.  Next visit plan: continue to progress PROM and therapeutic exercise per protocol  Total Session Time: 91    Plan details: Will progress per massive RCR protocol due to being a revision          Subjective     History of Present Illness  Date of Onset: 10/22/2013  Date of Surgery: 03/19/2019  Date of Evaluation: 10/23/2018  Surgical Procedure: RCR (s/p R RCR revision with biceps tenodesis)  Reason for Referral/Chief Complaint: S/p right shoulder revision rotator cuff repair, biceps tenodesis on 03/19/2019.      Subjective: Pain is getting a little better.    Quality of life: good          Pain      Current pain rating: 6      At best pain rating: 6      At worst pain rating: 10      Location: R shoulder constantly and  shooting pains down R arm intermittently      Quality: aching, sharp and numbing (numbness in R hand)      Relieving factors: medications and ice      Aggravating factors: lying and performance of arm dominant activites (Pain during sleep in any position)      Pain Related Behaviors: none    Symptom course: Does not have pressure sensation in posterior R shoulder as before surgery.                  Current functional status: disturbed sleep, limited lifting and limited recreation      Precautions and Equipment  : follow massive RTC protocol.  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    Right shoulder PASSIVE ROM (adhering to protocol limits):  90 degrees flexion  20 degrees ER    Full elbow extension     Treatment Rendered Today:    Manual Therapy: 20 minutes  PROM into elbow flexion/extension and forearm supination/pronation  PROM into shoulder ER  PROM into shoulder flexion    Therapeutic exercise: 18 minutes  Re-assessment of symptoms  Re-iteration of precautions per protocol  Short axis pendulums: x20 (HEP, 2-3x/day)  Scap squeezes: x10 (HEP, 2-3x/day)  AROM wrist flexion/extension/gripping: x10 (HEP, 2-3x/day)  PROM shoulder flexion in supine with use of L UE: x5  PROM shoulder flexion in standing with use of table step backs: x5 (+HEP)      Total treatment time: 38 minutes      Patient wore a mask for the entire therapy session., Therapist wore a mask for the entire session.  and Therapist wore a face shield during the entire session.               Therapeutic Interventions  $$ Therapeutic Exercise [mins]: 18  $$ Manual Therapy [mins]: 20             I attest that I have reviewed the above information.  Signed: Ashok Cordia, PT  04/21/2019 4:31 PM

## 2019-04-22 ENCOUNTER — Ambulatory Visit: Admit: 2019-04-22 | Discharge: 2019-04-23 | Payer: MEDICARE

## 2019-04-22 MED ORDER — HYDROCODONE 5 MG-ACETAMINOPHEN 325 MG TABLET
ORAL_TABLET | Freq: Four times a day (QID) | ORAL | 0 refills | 8.00000 days | Status: CP | PRN
Start: 2019-04-22 — End: ?

## 2019-04-22 NOTE — Unmapped (Signed)
Date of surgery: 03/19/2019.    Procedure performed: Right shoulder revision rotator cuff repair.     Prior procedure was 10/02/2018.      HPI: This patient has received more oxycodone but also has been more comfortable with physical therapy.  He has been compliant with the sling.    Physical examination: Passive abduction is 60, passive external Tatian to 35.    Internal rotation abdomen.    Flexion-extension passive 20 degrees in each direction.    Assessment: This represents an improvement from last visit with me.    Clinically improved.    Plan: #1 discontinue sling.    2.  Transition down to hydrocodone as narcotic pain treatment.    3.  Continue physical therapy 1 or 2 days a week.    Follow-up with me in approximately 5 weeks.

## 2019-04-26 NOTE — Unmapped (Signed)
[facility] PT [facility] [place]  OUTPATIENT PHYSICAL THERAPY  04/26/2019  Note Type: Treatment Note       Patient Name: Rodney Bender [facility]  Date of Birth:1970-12-03  Diagnosis:   Encounter Diagnoses   Name Primary?   ??? Traumatic tear of right rotator cuff, unspecified tear extent, subsequent encounter Yes   ??? Chronic right shoulder pain    ??? Post-operative state      Referring MD:  Gonzella Lex, MD   Plan of Care Effective Date: 04/14/19 - 08/14/19  Visit #: 3      Assessment & Plan     Assessment details: Pt presents for second follow-up s/p right shoulder revision rotator cuff repair and biceps tenodesis on 03/19/2019.  Original rotator cuff repair surgery was on 10/02/18.  His pain seems to be decreasing and he is tolerating our PT sessions much better now than at initial eval.  He is currently 5 weeks post-op and is meeting PROM goals for this point post-op.  Pt will benefit from skilled PT services to address above impairments.       Impairments: decreased range of motion, decreased strength, pain and postural weakness        Personal Factors/Comorbidities: 3+  Specific Comorbidities: hx of previous R RTC repair, COVID-19 pandemic, anxiety, Crohn's  Examination of Body Systems: 4+ elements  Body System: posture, ROM, sensation, activity limitations, participation restrictions   Clinical Presentation: stable  Clinical Decision Making: low    Prognosis: fair      Negative Prognosis Rationale: Pain Status, chronicity of condition and severity of symptoms.        Therapy Goals  Goals: In 16 weeks:  1. Pt will be independent with home exercise program  2. Pt will demonstrate R shld AROM WFL in all planes to allow full mobility for bathing and dressing tasks.  3. Pt will be able to return to fishing without being limited by shoulder pain.  4. Pt will report 4/10 pain or less at all times to enable performance of daily activities with less pain.    Plan  Therapy options: will be seen for skilled physical therapy services Planned therapy interventions: manual therapy, neuromuscular re-education, postural training, body mechanics training, education - patient, therapeutic exercises and home exercise program        Frequency: 1-2x per week.    Duration in weeks: up to 16 weeks    Education provided to: patient.  Education provided: importance of Therapy and HEP  Education Results: verbalized good understanding and needs reinforcement.  Communication/Consultation: Medicare Cert/POC sent to Referring Provider.  Next visit plan: continue to progress PROM and therapeutic exercise per protocol  Total Session Time: 76    Plan details: Will progress per massive RCR protocol due to being a revision          Subjective     History of Present Illness  Date of Onset: 10/22/2013  Date of Surgery: 03/19/2019  Date of Evaluation: 10/23/2018  Surgical Procedure: RCR (s/p R RCR revision with biceps tenodesis)  Reason for Referral/Chief Complaint: S/p right shoulder revision rotator cuff repair, biceps tenodesis on 03/19/2019.      Subjective: Pain is getting a little better.  Saw Dr. Ivin Poot last week and he said I'm doing better than I was.    Quality of life: good          Pain      Current pain rating: 5      At best pain rating:  6      At worst pain rating: 10      Location: R shoulder constantly and shooting pains down R arm intermittently      Quality: aching, sharp and numbing (numbness in R hand)      Relieving factors: medications and ice      Aggravating factors: lying and performance of arm dominant activites (Pain during sleep in any position)      Pain Related Behaviors: none    Symptom course: Does not have pressure sensation in posterior R shoulder as before surgery.                  Current functional status: disturbed sleep, limited lifting and limited recreation      Precautions and Equipment  : follow massive RTC protocol.  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 Ivin Poot told him he didn't have to wear it anymore).    Right shoulder PASSIVE ROM (adhering to protocol limits):  90 degrees flexion  20 degrees ER    Full elbow extension     Treatment Rendered Today:    Manual Therapy: 20 minutes  PROM into elbow flexion/extension and forearm supination/pronation  PROM into shoulder ER  PROM into shoulder flexion    Therapeutic exercise: 18 minutes  Re-assessment of symptoms  Re-iteration of precautions per protocol  Long axis pendulums: x20 (HEP, 2-3x/day)  Scap squeezes (retraction and depression): x10 (HEP, 2-3x/day)  PROM shoulder flexion in standing with use of table step backs to 90 deg: x5 (HEP)  PROM shoulder ER in supine with use of stick to 20 deg: x10 (+HEP)    Total treatment time: 38 minutes      Patient wore a mask for the entire therapy session., Therapist wore a mask for the entire session.  and Therapist wore a face shield during the entire session.               Therapeutic Interventions  $$ Therapeutic Exercise [mins]: 18  $$ Manual Therapy [mins]: 20             I attest that I have reviewed the above information.  Signed: Ashok Cordia, PT  04/26/2019 11:32 AM

## 2019-05-05 MED ORDER — NAPROXEN 500 MG TABLET
0 days
Start: 2019-05-05 — End: ?

## 2019-05-06 MED ORDER — DRONABINOL 10 MG CAPSULE
ORAL_CAPSULE | 0 refills | 0 days | Status: CP
Start: 2019-05-06 — End: ?

## 2019-05-10 MED ORDER — DICLOFENAC SODIUM 75 MG TABLET,DELAYED RELEASE
0 days
Start: 2019-05-10 — End: ?

## 2019-05-10 NOTE — Unmapped (Signed)
[facility]  OUTPATIENT PHYSICAL THERAPY  05/10/2019  Note Type: Treatment Note       Patient Name: Rodney Bender [facility]  Date of Birth:02-16-1970  Diagnosis:   Encounter Diagnoses   Name Primary?   ??? Traumatic tear of right rotator cuff, unspecified tear extent, subsequent encounter Yes   ??? Chronic right shoulder pain    ??? Post-operative state      Referring MD:  Gonzella Lex, MD   Plan of Care Effective Date: 04/14/19 - 08/14/19  Visit #: 4      Assessment & Plan     Assessment details: Pt presents for third follow-up s/p right shoulder revision rotator cuff repair and biceps tenodesis on 03/19/2019.  Original rotator cuff repair surgery was on 10/02/18.  He is currently 7 weeks post-op.  His pain seems to be decreasing and he is tolerating our PT sessions much better now than at initial eval.  He was once again able to easily meet all PROM goals for this point post-op today.  Pt will benefit from skilled PT services to address above impairments.       Impairments: decreased range of motion, decreased strength, pain and postural weakness        Personal Factors/Comorbidities: 3+  Specific Comorbidities: hx of previous R RTC repair, COVID-19 pandemic, anxiety, Crohn's  Examination of Body Systems: 4+ elements  Body System: posture, ROM, sensation, activity limitations, participation restrictions   Clinical Presentation: stable  Clinical Decision Making: low    Prognosis: fair      Negative Prognosis Rationale: Pain Status, chronicity of condition and severity of symptoms.        Therapy Goals  Goals: In 16 weeks:  1. Pt will be independent with home exercise program  2. Pt will demonstrate R shld AROM WFL in all planes to allow full mobility for bathing and dressing tasks.  3. Pt will be able to return to fishing without being limited by shoulder pain.  4. Pt will report 4/10 pain or less at all times to enable performance of daily activities with less pain.    Plan  Therapy options: will be seen for skilled physical therapy services    Planned therapy interventions: manual therapy, neuromuscular re-education, postural training, body mechanics training, education - patient, therapeutic exercises and home exercise program        Frequency: 1-2x per week.    Duration in weeks: up to 16 weeks    Education provided to: patient.  Education provided: importance of Therapy and HEP  Education Results: verbalized good understanding and needs reinforcement.  Communication/Consultation: Medicare Cert/POC sent to Referring Provider.  Next visit plan: continue to progress PROM and therapeutic exercise per protocol  Total Session Time: 30    Plan details: Will progress per massive RCR protocol due to being a revision          Subjective     History of Present Illness  Date of Onset: 10/22/2013  Date of Surgery: 03/19/2019  Date of Evaluation: 10/23/2018  Surgical Procedure: RCR (s/p R RCR revision with biceps tenodesis)  Reason for Referral/Chief Complaint: S/p right shoulder revision rotator cuff repair, biceps tenodesis on 03/19/2019.      Subjective: Shoulder is doing alright.    Quality of life: good          Pain      Current pain rating: 6      At best pain rating: 6  At worst pain rating: 10      Location: R shoulder constantly and shooting pains down R arm intermittently      Quality: aching, sharp and numbing (numbness in R hand)      Relieving factors: medications and ice      Aggravating factors: lying and performance of arm dominant activites (Pain during sleep in any position)      Pain Related Behaviors: none    Symptom course: Does not have pressure sensation in posterior R shoulder as before surgery.                  Current functional status: disturbed sleep, limited lifting and limited recreation      Precautions and Equipment  : follow massive RTC protocol.  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 Ivin Poot told him he didn't have to wear it anymore).    Right shoulder PASSIVE ROM (adhering to protocol limits):  120 degrees flexion  30 degrees ER    Full elbow extension     Treatment Rendered Today:    Manual Therapy: 15 minutes  PROM into elbow flexion/extension and forearm supination/pronation  PROM into shoulder ER  PROM into shoulder flexion  Grade II AP and inferior joint mobs: 2x30 each    Therapeutic exercise: 15 minutes  Re-assessment of symptoms  Re-iteration of precautions per protocol  ?? Lightweight activity below shoulder height  ?? No treadmill running, elliptical, stairmaster, swimming, jumping, or running until 12 weeks at least  ?? Continue to use pillow under affected arm PRN  ?? Try to avoid sleeping on affected side  ?? Avoid sudden jerking, pushing, or pulling, avoid heavy lifting  ?? No lifting greater than 5 lbs over shoulder height  Long axis pendulums: x20 (HEP, 2-3x/day)  Scap squeezes (retraction and depression): x10 (HEP, 2-3x/day)  PROM shoulder flexion in standing with use of table step backs to 120 deg: x5 (HEP)  PROM shoulder ER in supine with use of stick to 30 deg: x10 (HEP)  Scapular clock in L SL: x10  Manual resistance of scapular retraction and depression:5x10 each  AAROM flexion table slides to 120    Total treatment time: 30 minutes      Patient wore a mask for the entire therapy session., Therapist wore a mask for the entire session.  and Therapist wore a face shield during the entire session.               Therapeutic Interventions  $$ Therapeutic Exercise [mins]:  15  $$ Manual Therapy [mins]: 15             I attest that I have reviewed the above information.  Signed: Ashok Cordia, PT  05/10/2019 1:08 PM

## 2019-05-11 NOTE — Unmapped (Signed)
Patient triage stated his medications were only taken as needed. Chose not to give a date for any of them. The rest of triage is complete

## 2019-05-13 ENCOUNTER — Ambulatory Visit: Admit: 2019-05-13 | Discharge: 2019-05-14 | Payer: MEDICARE

## 2019-05-13 LAB — COMPREHENSIVE METABOLIC PANEL
ALBUMIN: 3.9 g/dL (ref 3.5–5.0)
ALKALINE PHOSPHATASE: 103 U/L (ref 38–126)
ALT (SGPT): 27 U/L (ref <50)
ANION GAP: 6 mmol/L — ABNORMAL LOW (ref 7–15)
AST (SGOT): 25 U/L (ref 19–55)
BILIRUBIN TOTAL: 0.4 mg/dL (ref 0.0–1.2)
BLOOD UREA NITROGEN: 10 mg/dL (ref 7–21)
BUN / CREAT RATIO: 13
CALCIUM: 9.2 mg/dL (ref 8.5–10.2)
CHLORIDE: 96 mmol/L — ABNORMAL LOW (ref 98–107)
CO2: 33 mmol/L — ABNORMAL HIGH (ref 22.0–30.0)
CREATININE: 0.8 mg/dL (ref 0.70–1.30)
EGFR CKD-EPI AA MALE: >90 mL/min/{1.73_m2} (ref >=60)
EGFR CKD-EPI NON-AA MALE: >90 mL/min/{1.73_m2} (ref >=60)
POTASSIUM: 4.4 mmol/L (ref 3.5–5.0)
PROTEIN TOTAL: 6.6 g/dL (ref 6.5–8.3)
SODIUM: 135 mmol/L (ref 135–145)

## 2019-05-13 LAB — FREE T4: Thyroxine.free:MCnc:Pt:Ser/Plas:Qn:: 1.33

## 2019-05-13 LAB — CBC W/ AUTO DIFF
BASOPHILS ABSOLUTE COUNT: 0 10*9/L (ref 0.0–0.1)
BASOPHILS RELATIVE PERCENT: 0.3 %
EOSINOPHILS ABSOLUTE COUNT: 0.1 10*9/L (ref 0.0–0.7)
HEMATOCRIT: 33 % — ABNORMAL LOW (ref 38.0–50.0)
HEMOGLOBIN: 11.5 g/dL — ABNORMAL LOW (ref 13.5–17.5)
LYMPHOCYTES ABSOLUTE COUNT: 2.9 10*9/L (ref 0.7–4.0)
LYMPHOCYTES RELATIVE PERCENT: 20.8 %
MEAN CORPUSCULAR HEMOGLOBIN CONC: 34.9 g/dL (ref 30.0–36.0)
MEAN CORPUSCULAR HEMOGLOBIN: 32.7 pg (ref 26.0–34.0)
MEAN CORPUSCULAR VOLUME: 93.5 fL (ref 81.0–95.0)
MEAN PLATELET VOLUME: 6.7 fL — ABNORMAL LOW (ref 7.0–10.0)
MONOCYTES ABSOLUTE COUNT: 1.2 10*9/L — ABNORMAL HIGH (ref 0.1–1.0)
MONOCYTES RELATIVE PERCENT: 8.6 %
NEUTROPHILS ABSOLUTE COUNT: 9.6 10*9/L — ABNORMAL HIGH (ref 1.7–7.7)
NUCLEATED RED BLOOD CELLS: 0 /100{WBCs} (ref <=4)
PLATELET COUNT: 325 10*9/L (ref 150–450)
RED BLOOD CELL COUNT: 3.53 10*12/L — ABNORMAL LOW (ref 4.32–5.72)
RED CELL DISTRIBUTION WIDTH: 14.3 % (ref 12.0–15.0)

## 2019-05-13 LAB — WBC ADJUSTED: Leukocytes:NCnc:Pt:Bld:Qn:: 13.8 — ABNORMAL HIGH

## 2019-05-13 LAB — VITAMIN B-12: Cobalamins:MCnc:Pt:Ser/Plas:Qn:: 268

## 2019-05-13 LAB — THYROID STIMULATING HORMONE: Thyrotropin:ACnc:Pt:Ser/Plas:Qn:: 0.952

## 2019-05-13 LAB — SMEAR REVIEW

## 2019-05-13 LAB — AST (SGOT): Aspartate aminotransferase:CCnc:Pt:Ser/Plas:Qn:: 25

## 2019-05-13 MED ORDER — OLANZAPINE 2.5 MG TABLET
ORAL_TABLET | Freq: Every evening | ORAL | 0 refills | 30 days | Status: CP
Start: 2019-05-13 — End: 2019-06-12

## 2019-05-13 MED ORDER — GABAPENTIN 300 MG CAPSULE
ORAL_CAPSULE | Freq: Three times a day (TID) | ORAL | 1 refills | 90 days | Status: CP
Start: 2019-05-13 — End: 2019-11-09

## 2019-05-13 NOTE — Unmapped (Signed)
1. Return in about 3 months (around 08/12/2019). Schedule at the check-out desk or call [phone] to schedule follow-up.  2. Reach out to [place] at my e-mail or by calling Robin at the numbers below if you have any questions or problems in the interim.  3. If you do not receive a call to schedule any tests or visits with doctors we refer you to, please call the scheduling numbers below or reach out to my e-mail or Robin for help scheduling.  4. Continue stellara.  5. Continue dronabinol at the current dose.  6. We'll check labs and urine for Crohn's disease and STI screening.    -------------------------------------------------------------------------------    If you have any questions or concerns please contact us as below:    My email is [email or web address]  For appointments, please call [phone].  For emergencies after hours call [phone] and ask for the GI medicine fellow on call.    For scheduling radiology appointments [phone].  For scheduling GI procedures (e.g,. Colonoscopy), please call [phone].    ==============================================

## 2019-05-13 NOTE — Unmapped (Deleted)
He is having formed to loose bm about once or twice per day, no blood. He continues to have constant steady epigastric pain. Not worse after he eats. Weight up to 150 eating well. Still doing Stellara every 8 weeks, no side effects with injection.     Wants sti screen

## 2019-05-13 NOTE — Unmapped (Signed)
[facility] VISIT    REFERRING PROVIDER: Suanne Marker, MD  [address]  [place],  [place] [address]    PRIMARY CARE PROVIDER: Suanne Marker, MD    PATIENT PROFILE: Rodney Bender is a 49 y.o. man who is seen in consultation at the request of Dr. Domingo Madeira for evaluation of Ileal Crohn's disease.    ASSESSMENT AND PLAN:     Rodney Bender is a 49 y.o. man with history significant for ileal stricturing Crohn's disease s/p four ileocecal resections with a long history of adherence issues and fractured care across institutions who presents for routine follow-up.    Ileal stricturing Crohn's disease: I think very well controlled at this time, symptoms minimal and at chronic baseline, gaining some weight though still quite thin. We should recheck B-12 and TSH. He has a recent staging scope and doesn't need screening.  - Continue Stellara  - Continue Dronabinol for appetite support    Health maintenance in Crohn's disease: has had pneumococcal immunizations, declines influenza vaccination.   - Declines hep a and b vaccine  - Declines influenza  - Declines covid, discussed in detail    Patient Instructions 1.   Return in about 3 months (around 08/12/2019). Schedule at the check-out desk or call [phone] to schedule follow-up.  2. Reach out to [place] at my e-mail or by calling Robin at the numbers below if you have any questions or problems in the interim.  3. If you do not receive a call to schedule any tests or visits with doctors we refer you to, please call the scheduling numbers below or reach out to my e-mail or Robin for help scheduling.  4. Continue stellara.  5. Continue dronabinol at the current dose.  6. We'll check labs and urine for Crohn's disease and STI screening.    -------------------------------------------------------------------------------    If you have any questions or concerns please contact us as below:    My email is [email or web address]  For appointments, please call [phone].  For emergencies after hours call [phone] and ask for the GI medicine fellow on call.    For scheduling radiology appointments [phone].  For scheduling GI procedures (e.g,. Colonoscopy), please call [phone].    ==============================================        Return in about 3 months (around 08/12/2019).    Rodney Oris, MD  Gastroenterology and Hepatology Fellow (PGY-4)  [email or web address] (patient's providers should feel free to reach out with any questions whatsoever)    The patient was seen and discussed with Dr. Marcella Dubs, who agreed with the above assessment and plan.    SUBJECTIVE:     CHIEF COMPLAINT: Follow-up    HISTORY OF PRESENT ILLNESS:      Rodney Bender is a 49 y.o. man with history significant for ileal stricturing Crohn's disease s/p four ileocecal resections with a long history of adherence issues and fractured care across institutions who presents for routine follow-up.    Doing overall very well. Still mild persistent upper abdominal pain. Appetite poor without but improved with dronabinol. His weight is improving a bit. Significant acute issues with his shoulder, sounds like rotator cuff, low suspicion for arthritis from Crohn's disease. 1-2 formed to loose stools per day improved from baseline of 3-4, maybe opioids having an influence.    REVIEW OF SYSTEMS:   The balance of 12 systems reviewed is negative except as noted in the history of present illness.    Past Medical History:  Diagnosis Date   ??? Anxiety    ??? Crohn's disease (CMS-HCC)     diagnosed in 1990   ??? GERD (gastroesophageal reflux disease)    ??? Hypertension 10/08/2016     Past Surgical History:   Procedure Laterality Date   ??? COLON SURGERY     ??? PR COLONOSCOPY FLX DX W/COLLJ SPEC WHEN PFRMD Left 11/10/2012    Procedure: COLONOSCOPY, FLEXIBLE, PROXIMAL TO SPLENIC FLEXURE; DIAGNOSTIC, W/WO COLLECTION SPECIMEN BY BRUSH OR WASH;  Surgeon: Malcolm Metro, MD; Location: [facility];  Service: Gastroenterology   ??? PR COLONOSCOPY FLX DX W/COLLJ SPEC WHEN PFRMD N/A 07/21/2013    Procedure: COLONOSCOPY, FLEXIBLE, PROXIMAL TO SPLENIC FLEXURE; DIAGNOSTIC, W/WO COLLECTION SPECIMEN BY BRUSH OR WASH;  Surgeon: Gwen Pounds, MD;  Location: [facility] PROC[facility]URES [facility];  Service: Gastroenterology   ??? PR COLONOSCOPY FLX DX W/COLLJ SPEC WHEN PFRMD N/A 08/09/2016    Procedure: COLONOSCOPY, FLEXIBLE, PROXIMAL TO SPLENIC FLEXURE; DIAGNOSTIC, W/WO COLLECTION SPECIMEN BY BRUSH OR WASH;  Surgeon: Janyth Pupa, MD;  Location: GI PROCEDURES [facility];  Service: Gastroenterology   ??? PR COLONOSCOPY FLX DX W/COLLJ SPEC WHEN PFRMD N/A 04/06/2018    Procedure: COLONOSCOPY, FLEXIBLE, PROXIMAL TO SPLENIC FLEXURE; DIAGNOSTIC, W/WO COLLECTION SPECIMEN BY BRUSH OR WASH;  Surgeon: Zetta Bills, MD;  [facility];  Service: Gastroenterology   ??? PR COLONOSCOPY W/BIOPSY SINGLE/MULTIPLE  07/23/2012    Procedure: COLONOSCOPY, FLEXIBLE, PROXIMAL TO SPLENIC FLEXURE; WITH BIOPSY, SINGLE OR MULTIPLE;  Surgeon: Vickii Chafe, MD;  Location: [facility] PROC[facility]URES [facility];  Service: Gastroenterology   ??? PR COLONOSCOPY W/BIOPSY SINGLE/MULTIPLE N/A 07/15/2014    Procedure: COLONOSCOPY, FLEXIBLE, PROXIMAL TO SPLENIC FLEXURE; WITH BIOPSY, SINGLE OR MULTIPLE;  Surgeon: Janyth Pupa, MD;  Location: [facility]URES [facility];  Service: Gastroenterology   ??? PR COLONOSCOPY W/BIOPSY SINGLE/MULTIPLE N/A 03/21/2017    Procedure: COLONOSCOPY, FLEXIBLE, PROXIMAL TO SPLENIC FLEXURE; WITH BIOPSY, SINGLE OR MULTIPLE;  Surgeon: Modena Nunnery, MD;  Location: [facility];  Service: Gastroenterology   ??? PR COLONOSCOPY W/BIOPSY SINGLE/MULTIPLE N/A 02/12/2019    Procedure: COLONOSCOPY, FLEXIBLE, PROXIMAL TO SPLENIC FLEXURE; WITH BIOPSY, SINGLE OR MULTIPLE;  Surgeon: Jules Husbands, MD;  Location: [facility];  Service: Gastroenterology   ??? PR COLSC FLEXIBLE W/TRANSENDOSCOPIC BALLOON DILAT N/A 07/15/2014    Procedure: COLONOSCOPY, FLEXIBLE; WITH DILATION BY BALLOON, 1 OR MORE STRICTURES;  Surgeon: Janyth Pupa, MD;  Location: [facility];  Service: Gastroenterology   ??? PR REMVL COLON & TERM ILEUM W/ILEOCOLOSTOMY N/A 09/16/2016    Procedure: COLECTOMY, PARTIAL, WITH REMOVAL OF TERMINAL ILEUM WITH ILEOCOLOSTOMY;  Surgeon: Lady Gary, MD;  Location: MAIN OR [facility];  Service: Gastrointestinal   ??? PR REPAIR BICEPS LONG TENDON Right 03/19/2019    Procedure: TENODESIS LONG TENDON BICEPS;  Surgeon: Gonzella Lex, MD;  Location: ASC OR [facility];  Service: Orthopedics   ??? PR SHLDR ARTHROSCOP,PART ACROMIOPLAS Right 10/02/2018    Procedure: R16 ARTHROSCOPY, SHOULDER, SURGICAL; DECOMPRESS SUBACROMIAL SPACE W/PART ACROMIOPLASTY, Tamala Bari;  Surgeon: Gonzella Lex, MD;  Location: ASC OR [facility];  Service: Orthopedics   ??? PR SHLDR ARTHROSCOP,SURG,W/ROTAT CUFF REPR Right 10/02/2018    Procedure: ARTHROSCOPY, SHOULDER, SURGICAL; WITH ROTATOR CUFF REPAIR;  Surgeon: Gonzella Lex, MD;  Location: ASC OR [facility];  Service: Orthopedics   ??? PR SHLDR ARTHROSCOP,SURG,W/ROTAT CUFF REPR Right 03/19/2019    Procedure: R16 ARTHROSCOPY, SHOULDER, SURGICAL; WITH ROTATOR CUFF REPAIR;  Surgeon: Curly Rim  Ivin Poot, MD;  Location: ASC OR [facility];  Service: Orthopedics   ??? PR UPPER GI ENDOSCOPY,BIOPSY N/A 07/23/2012    Procedure: UGI ENDOSCOPY; WITH BIOPSY, SINGLE OR MULTIPLE;  Surgeon: Vickii Chafe, MD;  Location: [facility]URES [facility];  Service: Gastroenterology   ??? PR UPPER GI ENDOSCOPY,DIAGNOSIS N/A 11/10/2012    Procedure: UGI ENDO, INCLUDE ESOPHAGUS, STOMACH, & DUODENUM &/OR JEJUNUM; DX W/WO COLLECTION SPECIMN, BY BRUSH OR WASH;  Surgeon: Malcolm Metro, MD;  Location: [facility] PROC[facility]URES [facility];  Service: Gastroenterology   ??? PR UPPER GI ENDOSCOPY,DIAGNOSIS N/A 07/21/2013    Procedure: UGI ENDO, INCLUDE ESOPHAGUS, STOMACH, & DUODENUM &/OR JEJUNUM; DX W/WO COLLECTION SPECIMN, BY BRUSH OR WASH;  Surgeon: Gwen Pounds, MD;  Location: [facility]URES [facility];  Service: Gastroenterology   ??? PR UPPER GI ENDOSCOPY,DIAGNOSIS N/A 07/15/2014    Procedure: UGI ENDO, INCLUDE ESOPHAGUS, STOMACH, & DUODENUM &/OR JEJUNUM; DX W/WO COLLECTION SPECIMN, BY BRUSH OR WASH;  Surgeon: Janyth Pupa, MD;  Location: GI PROCEDURES [facility];  Service: Gastroenterology   ??? ROTATOR CUFF REPAIR Right 2020   ??? SPINE SURGERY       Current Outpatient Medications   Medication Sig Dispense Refill   ??? dronabinoL (MARINOL) 10 MG capsule TAKE 1 CAPSULE BY MOUTH TWICE DAILY 30  MINUTES  BEFORE  A  MEAL 60 capsule 0   ??? gabapentin (NEURONTIN) 300 MG capsule Take 1 capsule (300 mg total) by mouth Three (3) times a day. Takes as needed 270 capsule 1   ??? HYDROcodone-acetaminophen (NORCO) 5-325 mg per tablet Take 1 tablet by mouth every six (6) hours as needed for pain. 30 tablet 0   ??? 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 OLANZapine (ZYPREXA) 2.5 MG tablet Take 1 tablet (2.5 mg total) by mouth nightly. Take nightly for nausea. Can stop as nausea improves. 30 tablet 0   ??? oxyCODONE-acetaminophen (PERCOCET) 10-325 mg per tablet Take 1 tablet by mouth every eight (8) hours as needed for pain. 10 tablet 0   ??? sildenafil (VIAGRA) 50 MG tablet Take 50 mg by mouth daily as needed for erectile dysfunction.     ??? therapeutic multivitamin (THERAGRAN) tablet Take 1 tablet by mouth daily.     ??? ustekinumab (STELARA) 90 mg/mL Syrg syringe Inject the contents of 1 syringe (90 mg total) under the skin every 8 weeks. 1 mL 5     No current facility-administered medications for this visit.     Allergies  Reviewed on 05/11/2019      Reactions Comment    Tramadol Itching, Anxiety Other reaction(s): Other (See Comments)    Penicillins  Other reaction(s): Itching-Allergy    Dilaudid [hydromorphone] Anxiety     Morphine Itching     Opioids - Morphine Analogues Itching         Family History   Problem Relation Age of Onset   ??? Hyperlipidemia Father    ??? Cancer Father    ??? Cancer Maternal Aunt    ??? Stroke Mother    ??? No Known Problems Sister    ??? No Known Problems Brother    ??? No Known Problems Maternal Uncle    ??? No Known Problems Paternal Aunt    ??? No Known Problems Paternal Uncle    ??? No Known Problems Maternal Grandmother    ??? No Known Problems Maternal Grandfather    ??? No Known Problems Paternal Grandmother    ???  No Known Problems Paternal Grandfather    ??? Anesthesia problems Neg Hx    ??? Broken bones Neg Hx    ??? Clotting disorder Neg Hx    ??? Collagen disease Neg Hx    ??? Diabetes Neg Hx    ??? Dislocations Neg Hx    ??? Fibromyalgia Neg Hx    ??? Gout Neg Hx    ??? Hemophilia Neg Hx    ??? Osteoporosis Neg Hx    ??? Rheumatologic disease Neg Hx    ??? Scoliosis Neg Hx    ??? Severe sprains Neg Hx    ??? Sickle cell anemia Neg Hx    ??? Spinal Compression Fracture Neg Hx      Social History     Tobacco Use   ??? Smoking status: Former Smoker     Packs/day: 1.00     Years: 18.00     Pack years: 18.00     Types: Cigarettes     Start date: 08/27/2003     Quit date: 06/06/2017     Years since quitting: 1.9   ??? Smokeless tobacco: Never Used   ??? Tobacco comment: Pt smokes 1ppd, Pt is interested in tobacco cessation    Vaping Use   ??? Vaping Use: Never used   Substance Use Topics   ??? Alcohol use: No   ??? Drug use: Not Currently     Types: Marijuana       OBJECTIVE:   VITAL SIGNS: BP 128/91  - Pulse 75  - Temp 36.8 ??C (Temporal)  - Ht 180.3 cm (5' 11)  - Wt 68 kg (150 lb)  - BMI 20.92 kg/m??     Wt Readings from Last 6 Encounters:   05/11/19 68 kg (150 lb)   03/19/19 63 kg (138 lb 14.2 oz)   03/11/19 67.1 kg (148 lb)   02/22/19 64 kg (141 lb 1.6 oz)   02/12/19 59.4 kg (131 lb)   10/02/18 61.1 kg (134 lb 11.2 oz)       PHYSICAL EXAM:  General appearance: Appears well, no distress. Very thin.  Eyes: Anicteric sclera. No erythema.  ENT: No oral ulcers. Posterior oropharynx unremarkable.  Cardiovascular: RRR without murmurs, heaves, or thrills. No lower extremity edema.  Pulmonary: Normal work of breathing. Acyanotic.  Abdominal: soft, mild epigastric tenderness at baseline, nondistended, no masses or organomegaly.  Musculoskeletal: No temporal wasting. Normal joints of the hand.  Skin: No jaundice. No rashes.  Neurologic: Alert, oriented, and appropriate.  Psychiatric: Appropriate.

## 2019-05-14 LAB — THYROTROPIN RECEPTOR ANTIBODY: Thyrotropin receptor Ab:ACnc:Pt:Ser:Qn:: <1.1

## 2019-05-14 LAB — SYPHILIS RPR SCREEN: Reagin Ab:PrThr:Pt:Ser:Ord:RPR: NONREACTIVE

## 2019-05-17 NOTE — Unmapped (Signed)
[facility] PT [facility]  OUTPATIENT PHYSICAL THERAPY  05/17/2019  Note Type: Treatment Note       Patient Name: Rodney Bender [facility]  Date of Birth:Sep 11, 1970  Diagnosis:   Encounter Diagnoses   Name Primary?   ??? Traumatic tear of right rotator cuff, unspecified tear extent, subsequent encounter Yes   ??? Chronic right shoulder pain    ??? Post-operative state      Referring MD:  Gonzella Lex, MD   Plan of Care Effective Date: 04/14/19 - 08/14/19  Visit #: 5      Assessment & Plan     Assessment details: Pt presents for follow-up s/p right shoulder revision rotator cuff repair and biceps tenodesis on 03/19/2019.  Original rotator cuff repair surgery was on 10/02/18.  He is currently 8 weeks post-op.  He reported 0/10 pain at onset and throughout treatment session today which is a huge improvement.  He was once again able to easily meet all PROM goals for this point post-op today and was able to tolerate progression of exercises (as outlined by his protocol) without complaint.  Pt will benefit from skilled PT services to address above impairments.       Impairments: decreased range of motion, decreased strength, pain and postural weakness        Personal Factors/Comorbidities: 3+  Specific Comorbidities: hx of previous R RTC repair, COVID-19 pandemic, anxiety, Crohn's  Examination of Body Systems: 4+ elements  Body System: posture, ROM, sensation, activity limitations, participation restrictions   Clinical Presentation: stable  Clinical Decision Making: low    Prognosis: fair      Negative Prognosis Rationale: Pain Status, chronicity of condition and severity of symptoms.        Therapy Goals  Goals: In 16 weeks:  1. Pt will be independent with home exercise program  2. Pt will demonstrate R shld AROM WFL in all planes to allow full mobility for bathing and dressing tasks.  3. Pt will be able to return to fishing without being limited by shoulder pain.  4. Pt will report 4/10 pain or less at all times to enable performance of daily activities with less pain.    Plan  Therapy options: will be seen for skilled physical therapy services    Planned therapy interventions: manual therapy, neuromuscular re-education, postural training, body mechanics training, education - patient, therapeutic exercises and home exercise program        Frequency: 1-2x per week.    Duration in weeks: up to 16 weeks    Education provided to: patient.  Education provided: importance of Therapy and HEP  Education Results: verbalized good understanding and needs reinforcement.  Communication/Consultation: Medicare Cert/POC sent to Referring Provider.  Next visit plan: continue to progress PROM and therapeutic exercise per protocol  Total Session Time: 40    Plan details: Will progress per massive RCR protocol due to being a revision          Subjective     History of Present Illness  Date of Onset: 10/22/2013  Date of Surgery: 03/19/2019  Date of Evaluation: 10/23/2018  Surgical Procedure: RCR (s/p R RCR revision with biceps tenodesis)  Reason for Referral/Chief Complaint: S/p right shoulder revision rotator cuff repair, biceps tenodesis on 03/19/2019.      Subjective: Shoulder is doing better.  0/10 pain right now.    Quality of life: good          Pain      Current pain rating: 0  At best pain rating: 0      At worst pain rating: 10      Location: R shoulder constantly and shooting pains down R arm intermittently      Quality: aching, sharp and numbing (numbness in R hand)      Relieving factors: medications and ice      Aggravating factors: lying and performance of arm dominant activites (Pain during sleep in any position)      Pain Related Behaviors: none    Symptom course: Does not have pressure sensation in posterior R shoulder as before surgery.                  Current functional status: disturbed sleep, limited lifting and limited recreation      Precautions and Equipment  : follow massive RTC protocol.  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 Ivin Poot told him he didn't have to wear it anymore).    Right shoulder PASSIVE ROM (adhering to protocol limits):  120 degrees flexion  30 degrees ER    Full elbow extension     Treatment Rendered Today:    Manual Therapy: 20 minutes  PROM into elbow flexion/extension and forearm supination/pronation  PROM into shoulder ER  PROM into shoulder flexion  Grade II AP and inferior joint mobs: 2x30 each    Therapeutic exercise: 20 minutes  Re-assessment of symptoms  AAROM shoulder ER in supine with use of stick to 30 deg: x10 (HEP)  AAROM shoulder flexion with press up + flexion in supine with use of stick: 2x10     Total treatment time: 40 minutes      Patient wore a mask for the entire therapy session., Therapist wore a mask for the entire session.  and Therapist wore a face shield during the entire session.               Therapeutic Interventions  $$ Therapeutic Exercise [mins]: 20  $$ Manual Therapy [mins]: 20             I attest that I have reviewed the above information.  Signed: Ashok Cordia, PT  05/17/2019 1:42 PM

## 2019-05-27 ENCOUNTER — Ambulatory Visit: Admit: 2019-05-27 | Discharge: 2019-05-28 | Payer: MEDICARE

## 2019-05-27 NOTE — Unmapped (Signed)
Date of surgery: 03/19/2019.    Procedure performed: Revision right shoulder rotator cuff repair.    Prior procedure was 10/02/2018.    HPI: The patient says that he has limited to no pain.    This represents a substantial improvement over his last visit.    He has been attending PT visits and feels like he is making progress.    Physical examination: Active forward flexion to 140, left side active forward flexion to 170,    Internal rotation right to approximately T12.  Internal rotation left to T10.    Strength: With the scapula stabilized and the elbow extended he has 4/5 strength in the supraspinatus.    Assessment: Definite progress described by the patient with less pain and more function.    Plan: Continue therapy both with the PT and on his own.    Follow-up with me clinically in about 2 months.

## 2019-05-28 ENCOUNTER — Ambulatory Visit
Admit: 2019-05-28 | Discharge: 2019-06-19 | Payer: MEDICARE | Attending: Rehabilitative and Restorative Service Providers" | Primary: Rehabilitative and Restorative Service Providers"

## 2019-05-28 NOTE — Unmapped (Signed)
[facility] PT [facility]  OUTPATIENT PHYSICAL THERAPY  05/28/2019  Note Type: Treatment Note       Patient Name: Rodney Bender  Date of Birth:January 24, 1971  Diagnosis:   Encounter Diagnoses   Name Primary?   ??? Traumatic tear of right rotator cuff, unspecified tear extent, subsequent encounter Yes   ??? Chronic right shoulder pain    ??? Post-operative state      Referring MD:  Gonzella Lex, MD   Plan of Care Effective Date: 04/14/19 - 08/14/19  Visit #: 6      Assessment & Plan     Assessment details: Pt presents for follow-up s/p right shoulder revision rotator cuff repair and biceps tenodesis on 03/19/2019.  Original rotator cuff repair surgery was on 10/02/18.  He is currently 10 weeks post-op.  He continues to do so much better, reporting 0/10 pain at onset and throughout today's treatment session. He was once again able to easily meet all PROM goals for this point post-op today and was able to tolerate progression of exercises (as outlined by his protocol) without complaint.  Pt will benefit from skilled PT services to address above impairments.       Impairments: decreased range of motion, decreased strength, pain and postural weakness        Personal Factors/Comorbidities: 3+  Specific Comorbidities: hx of previous R RTC repair, COVID-19 pandemic, anxiety, Crohn's  Examination of Body Systems: 4+ elements  Body System: posture, ROM, sensation, activity limitations, participation restrictions   Clinical Presentation: stable  Clinical Decision Making: low    Prognosis: fair      Negative Prognosis Rationale: Pain Status, chronicity of condition and severity of symptoms.        Therapy Goals  Goals: In 16 weeks:  1. Pt will be independent with home exercise program  2. Pt will demonstrate R shld AROM WFL in all planes to allow full mobility for bathing and dressing tasks.  3. Pt will be able to return to fishing without being limited by shoulder pain.  4. Pt will report 4/10 pain or less at all times to enable performance of daily activities with less pain.    Plan  Therapy options: will be seen for skilled physical therapy services    Planned therapy interventions: manual therapy, neuromuscular re-education, postural training, body mechanics training, education - patient, therapeutic exercises and home exercise program        Frequency: 1-2x per week.    Duration in weeks: up to 16 weeks    Education provided to: patient.  Education provided: importance of Therapy and HEP  Education Results: verbalized good understanding and needs reinforcement.  Communication/Consultation: Medicare Cert/POC sent to Referring Provider.  Next visit plan: continue to progress PROM and therapeutic exercise per protocol  Total Session Time: 5    Plan details: Will progress per massive RCR protocol due to being a revision          Subjective     History of Present Illness  Date of Onset: 10/22/2013  Date of Surgery: 03/19/2019  Date of Evaluation: 10/23/2018  Surgical Procedure: RCR (s/p R RCR revision with biceps tenodesis)  Reason for Referral/Chief Complaint: S/p right shoulder revision rotator cuff repair, biceps tenodesis on 03/19/2019.      Subjective: No pain.  0/10 pain since last session.      Quality of life: good          Pain      Current pain rating:  0      At best pain rating: 0      At worst pain rating: 10      Location: R shoulder constantly and shooting pains down R arm intermittently      Quality: aching, sharp and numbing (numbness in R hand)      Relieving factors: medications and ice      Aggravating factors: lying and performance of arm dominant activites (Pain during sleep in any position)      Pain Related Behaviors: none    Symptom course: Does not have pressure sensation in posterior R shoulder as before surgery.                  Current functional status: disturbed sleep, limited lifting and limited recreation      Precautions and Equipment  : follow massive RTC protocol.  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 Ivin Poot told him he didn't have to wear it anymore).    Right shoulder PASSIVE ROM (adhering to protocol limits):  155 degrees flexion  45 degrees ER    Full elbow extension     Treatment Rendered Today:    Manual Therapy: 15 minutes  PROM into elbow flexion/extension and forearm supination/pronation  PROM into shoulder ER  PROM into shoulder flexion  Grade II AP and inferior joint mobs: 2x30 each    Therapeutic exercise: 23 minutes  Re-assessment of symptoms  AAROM shoulder ER in supine with use of stick to 45 deg: x10 (HEP)  AAROM shoulder flexion with press up + flexion in supine with use of stick: 2x10 (HEP)  AROM shoulder flexion in supine: 2x5  AROM shoulder flexion on an incline: 2x5  50% submax ER isometric against PT: 3x5x5  50% submax IR isometric against PT: 3x5x5  AAROM flexion wall slide: 2x5 (+HEP)  Ball on wall with 1# med ball: x20 circles x 2 sets  Quadruped positioning with small lateral weight shifts: 2x10  Scap squeezes no weight (HEP)      Total treatment time: 38 minutes      Patient wore a mask for the entire therapy session., Therapist wore a mask for the entire session.  and Therapist wore a face shield during the entire session.               Therapeutic Interventions  $$ Therapeutic Exercise [mins]: 23  $$ Manual Therapy [mins]: 15             I attest that I have reviewed  the above information.  Signed: Ashok Cordia, PT  05/28/2019 2:47 PM

## 2019-06-01 ENCOUNTER — Other Ambulatory Visit: Payer: Self-pay

## 2019-06-01 ENCOUNTER — Encounter: Payer: Self-pay | Admitting: Anesthesiology

## 2019-06-01 ENCOUNTER — Ambulatory Visit: Payer: Medicare Other | Attending: Anesthesiology | Admitting: Anesthesiology

## 2019-06-01 DIAGNOSIS — R109 Unspecified abdominal pain: Secondary | ICD-10-CM

## 2019-06-01 DIAGNOSIS — M542 Cervicalgia: Secondary | ICD-10-CM

## 2019-06-01 DIAGNOSIS — K5090 Crohn's disease, unspecified, without complications: Secondary | ICD-10-CM

## 2019-06-01 DIAGNOSIS — M549 Dorsalgia, unspecified: Secondary | ICD-10-CM

## 2019-06-01 DIAGNOSIS — F1190 Opioid use, unspecified, uncomplicated: Secondary | ICD-10-CM | POA: Diagnosis not present

## 2019-06-01 DIAGNOSIS — G8929 Other chronic pain: Secondary | ICD-10-CM

## 2019-06-01 DIAGNOSIS — Z79891 Long term (current) use of opiate analgesic: Secondary | ICD-10-CM

## 2019-06-01 DIAGNOSIS — G894 Chronic pain syndrome: Secondary | ICD-10-CM

## 2019-06-01 DIAGNOSIS — M47816 Spondylosis without myelopathy or radiculopathy, lumbar region: Secondary | ICD-10-CM

## 2019-06-01 MED ORDER — OXYCODONE-ACETAMINOPHEN 10-325 MG PO TABS: 1.0000 | ORAL_TABLET | Freq: Three times a day (TID) | ORAL | 0 refills | Status: AC | PRN

## 2019-06-01 MED ORDER — OXYCODONE-ACETAMINOPHEN 10-325 MG PO TABS: 1.0000 | ORAL_TABLET | Freq: Three times a day (TID) | ORAL | 0 refills | Status: DC | PRN

## 2019-06-01 NOTE — Progress Notes (Signed)
Virtual Visit via Telephone Note  I connected with Benjamin Murillo on 06/01/19 at  2:00 PM EDT by telephone and verified that I am speaking with the correct person using two identifiers.  Location: Patient: Home Provider: Pain control center   I discussed the limitations, risks, security and privacy concerns of performing an evaluation and management service by telephone and the availability of in person appointments. I also discussed with the patient that there may be a patient responsible charge related to this service. The patient expressed understanding and agreed to proceed.   History of Present Illness: I spoke with Benjamin Murillo via telephone as he was unable to do the video portion of the virtual conference.  He reports that he has been doing well with his low back and neck pain.  His abdominal pain has been waxing and waning as far severity but he continues to take his medications for pain control.  This is consistent with his baseline long-term historical pain syndrome.  He reports good relief with the medications and that he continues to derive good functional benefit with them.  No side effects reported.  Unfortunately he has failed more conservative therapy.  At this point he feels that he is doing reasonably well and managing well with his medications.  Otherwise the quality characteristic and distribution of his pain syndrome has been stable in nature.    Observations/Objective:  Current Outpatient Medications:  .  amlodipine-atorvastatin (CADUET) 10-10 MG tablet, Take 1 tablet by mouth daily., Disp: , Rfl:  .  dronabinol (MARINOL) 10 MG capsule, Take 10 mg by mouth 2 (two) times daily before a meal., Disp: , Rfl:  .  famotidine (PEPCID) 20 MG tablet, Take 20 mg by mouth 2 (two) times daily., Disp: , Rfl:  .  gabapentin (NEURONTIN) 100 MG capsule, Take 1 capsule (100 mg total) by mouth at bedtime., Disp: 30 capsule, Rfl: 3 .  hyoscyamine (LEVSIN, ANASPAZ) 0.125 MG tablet, Take 0.125  mg by mouth as needed. , Disp: , Rfl:  .  naloxone (NARCAN) nasal spray 4 mg/0.1 mL, For respiratory depression from opioids, Disp: 1 kit, Rfl: 2 .  oxyCODONE-acetaminophen (PERCOCET) 10-325 MG tablet, Take 1 tablet by mouth every 8 (eight) hours as needed for pain., Disp: 75 tablet, Rfl: 0 .  [START ON 07/01/2019] oxyCODONE-acetaminophen (PERCOCET) 10-325 MG tablet, Take 1 tablet by mouth every 8 (eight) hours as needed for pain., Disp: 75 tablet, Rfl: 0 .  prochlorperazine (COMPAZINE) 5 MG tablet, Take 5 mg by mouth every 8 (eight) hours as needed., Disp: , Rfl:  .  STELARA 90 MG/ML SOSY injection, Inject 90 mg as directed., Disp: , Rfl:   Assessment and Plan:  1. Chronic abdominal pain   2. Chronic pain syndrome   3. Chronic, continuous use of opioids   4. Crohn's disease without complication, unspecified gastrointestinal tract location ([place])   5. Cervicalgia   6. Chronic neck and back pain   7. Long term current use of opiate analgesic   8. Facet arthritis of lumbar region   Based on our discussion today and upon review of the [facility] practitioner database information when to refill his medicines for the next 2 months dated for May 5 and June 3.  I have requested that he present to the pain control center for urine drug screen for routine evaluation.  I will have him return to clinic in 2 months and have encouraged him to continue follow-up with his primary care physicians for  his baseline medical care.  He is to continue his stretching strengthening regimen. Follow Up Instructions:    I discussed the assessment and treatment plan with the patient. The patient was provided an opportunity to ask questions and all were answered. The patient agreed with the plan and demonstrated an understanding of the instructions.   The patient was advised to call back or seek an in-person evaluation if the symptoms worsen or if the condition fails to improve as anticipated.  I provided 25 minutes of  non-face-to-face time during this encounter.   [name], MD

## 2019-06-02 NOTE — Unmapped (Signed)
This patient is currently Under Review for Intensive Case Management services through the [facility]. For progress, care plan changes, updates or recent discharges please contact CM.        Deliah Goody - High Risk Care Coordinator   [facility]  [address] [place], [place] [address]  P: [phone] F: [phone]  [email or web address]

## 2019-06-02 NOTE — Unmapped (Signed)
Complex Case Management Pre-Assessment Note  Pre-Assessment  NOTE      Summary:  Care Coordinator spoke with patient and verified correct patient using two identifiers today for enrollment in Complex Case Management. Informed patient of Complex Case Management services. Pt has agreed to participate in the Complex Case Management program. Care Coordinator contact information was provided to patient. Care Coordinator scheduled a time for Case Manager to call patient to complete initial Assessment. Assessment Scheduled for : 06/09/19 at 12:30.     General Case management Questions:     General Care Management - Patient Level    Assessment completed with: patient[KH1.1]  Patient lives with: spouse[KH1.1]  Support system: spouse or partner (Comment: emotional support;transportation )[KH1.1]  Type of residence: private residence[KH1.1]  Transportation means: personal vehicle[KH1.1]  Exercise: yes[KH1.1]  Times per week: 7[KH1.1]  Type of exercise: walking [KH1.1]  Follow special diet?: regular[KH1.1]  Interested in seeing dietician?: No[KH1.1]  Experiencing side effects from current medications: No[KH1.1]  Interested in seeing pharmacist?: No[KH1.1]  Difficulty keeping appointments: No[KH1.1]  Need assistance with community resources?: No[KH1.1]  Other significant issues impacting care?: None [KH1.1]     Attribution     KH1.1 Steffanie Rainwater 06/02/19 11:48           History Review:     Past Medical History:   Diagnosis Date   ??? Anxiety    ??? Crohn's disease (CMS-HCC)     diagnosed in 1990   ??? GERD (gastroesophageal reflux disease)    ??? Hypertension 10/08/2016     Caregiver burden Unanswered   Cognitive Impairment Unanswered   Falls Risk Unanswered   Financial difficulty Unanswered   Frail Elderly Unanswered   Hearing impairment/loss Unanswered   Homeless Unanswered   Impaired mobility Unanswered   Inadequate social/family support Unanswered   Ineffective family coping Unanswered   Low Literacy Unanswered   Nonadherence to medication Unanswered   Non-english speaking Unanswered   Terminal Illness/Hospice Unanswered   Transportation barriers Unanswered   Visual impairment Unanswered     Past Surgical History:   Procedure Laterality Date   ??? COLON SURGERY     ??? PR COLONOSCOPY FLX DX W/COLLJ SPEC WHEN PFRMD Left 11/10/2012    Procedure: COLONOSCOPY, FLEXIBLE, PROXIMAL TO SPLENIC FLEXURE; DIAGNOSTIC, W/WO COLLECTION SPECIMEN BY BRUSH OR WASH;  Surgeon: Malcolm Metro, MD;  [facility]: [facility];  Service: Gastroenterology   ??? PR COLONOSCOPY FLX DX W/COLLJ SPEC WHEN PFRMD N/A 07/21/2013    Procedure: COLONOSCOPY, FLEXIBLE, PROXIMAL TO SPLENIC FLEXURE; DIAGNOSTIC, W/WO COLLECTION SPECIMEN BY BRUSH OR WASH;  Surgeon: Gwen Pounds, MD;  Location: [facility]URES [facility];  Service: Gastroenterology   ??? PR COLONOSCOPY FLX DX W/COLLJ SPEC WHEN PFRMD N/A 08/09/2016    Procedure: COLONOSCOPY, FLEXIBLE, PROXIMAL TO SPLENIC FLEXURE; DIAGNOSTIC, W/WO COLLECTION SPECIMEN BY BRUSH OR WASH;  Surgeon: Janyth Pupa, MD;  Location: [facility]URES [facility];  Service: Gastroenterology   ??? PR COLONOSCOPY FLX DX W/COLLJ SPEC WHEN PFRMD N/A 04/06/2018    Procedure: COLONOSCOPY, FLEXIBLE, PROXIMAL TO SPLENIC FLEXURE; DIAGNOSTIC, W/WO COLLECTION SPECIMEN BY BRUSH OR WASH;  Surgeon: Zetta Bills, MD;  [facility] [place] [facility];  Service: Gastroenterology   ??? PR COLONOSCOPY W/BIOPSY SINGLE/MULTIPLE  07/23/2012    Procedure: COLONOSCOPY, FLEXIBLE, PROXIMAL TO SPLENIC FLEXURE; WITH BIOPSY, SINGLE OR MULTIPLE;  Surgeon: Vickii Chafe, MD;  Location: [facility]URES [facility];  Service: Gastroenterology   ??? PR COLONOSCOPY W/BIOPSY SINGLE/MULTIPLE N/A 07/15/2014    Procedure:  COLONOSCOPY, FLEXIBLE, PROXIMAL TO SPLENIC FLEXURE; WITH BIOPSY, SINGLE OR MULTIPLE;  Surgeon: Janyth Pupa, MD;  Location: [facility];  Service: Gastroenterology   ??? PR COLONOSCOPY W/BIOPSY SINGLE/MULTIPLE N/A 03/21/2017 Procedure: COLONOSCOPY, FLEXIBLE, PROXIMAL TO SPLENIC FLEXURE; WITH BIOPSY, SINGLE OR MULTIPLE;  Surgeon: Modena Nunnery, MD;  Location: [facility] [place] [facility];  Service: Gastroenterology   ??? PR COLONOSCOPY W/BIOPSY SINGLE/MULTIPLE N/A 02/12/2019    Procedure: COLONOSCOPY, FLEXIBLE, PROXIMAL TO SPLENIC FLEXURE; WITH BIOPSY, SINGLE OR MULTIPLE;  Surgeon: Jules Husbands, MD;  Location: [facility]URES [facility];  Service: Gastroenterology   ??? PR COLSC FLEXIBLE W/TRANSENDOSCOPIC BALLOON DILAT N/A 07/15/2014    Procedure: COLONOSCOPY, FLEXIBLE; WITH DILATION BY BALLOON, 1 OR MORE STRICTURES;  Surgeon: Janyth Pupa, MD;  Location: [facility]URES [facility];  Service: Gastroenterology   ??? PR REMVL COLON & TERM ILEUM W/ILEOCOLOSTOMY N/A 09/16/2016    Procedure: COLECTOMY, PARTIAL, WITH REMOVAL OF TERMINAL ILEUM WITH ILEOCOLOSTOMY;  Surgeon: Lady Gary, MD;  Location: MAIN OR [facility];  Service: Gastrointestinal   ??? PR REPAIR BICEPS LONG TENDON Right 03/19/2019    Procedure: TENODESIS LONG TENDON BICEPS;  Surgeon: Gonzella Lex, MD;  Location: ASC OR [facility];  Service: Orthopedics   ??? PR SHLDR ARTHROSCOP,PART ACROMIOPLAS Right 10/02/2018    Procedure: R16 ARTHROSCOPY, SHOULDER, SURGICAL; DECOMPRESS SUBACROMIAL SPACE W/PART ACROMIOPLASTY, Tamala Bari;  Surgeon: Gonzella Lex, MD;  Location: ASC OR [facility];  Service: Orthopedics   ??? PR SHLDR ARTHROSCOP,SURG,W/ROTAT CUFF REPR Right 10/02/2018    Procedure: ARTHROSCOPY, SHOULDER, SURGICAL; WITH ROTATOR CUFF REPAIR;  Surgeon: Gonzella Lex, MD;  Location: ASC OR [facility];  Service: Orthopedics   ??? PR SHLDR ARTHROSCOP,SURG,W/ROTAT CUFF REPR Right 03/19/2019    Procedure: R16 ARTHROSCOPY, SHOULDER, SURGICAL; WITH ROTATOR CUFF REPAIR;  Surgeon: Gonzella Lex, MD;  Location: ASC OR [facility];  Service: Orthopedics   ??? PR UPPER GI ENDOSCOPY,BIOPSY N/A 07/23/2012    Procedure: UGI ENDOSCOPY; WITH BIOPSY, SINGLE OR MULTIPLE;  Surgeon: Vickii Chafe, MD;  Location: GI PROC[facility]URES [facility];  Service: Gastroenterology   ??? PR UPPER GI ENDOSCOPY,DIAGNOSIS N/A 11/10/2012    Procedure: UGI ENDO, INCLUDE ESOPHAGUS, STOMACH, & DUODENUM &/OR JEJUNUM; DX W/WO COLLECTION SPECIMN, BY BRUSH OR WASH;  Surgeon: Malcolm Metro, MD;  Location: [facility];  Service: Gastroenterology   ??? PR UPPER GI ENDOSCOPY,DIAGNOSIS N/A 07/21/2013    Procedure: UGI ENDO, INCLUDE ESOPHAGUS, STOMACH, & DUODENUM &/OR JEJUNUM; DX W/WO COLLECTION SPECIMN, BY BRUSH OR WASH;  Surgeon: Gwen Pounds, MD;  [facility]: [facility];  Service: Gastroenterology   ??? PR UPPER GI ENDOSCOPY,DIAGNOSIS N/A 07/15/2014    Procedure: UGI ENDO, INCLUDE ESOPHAGUS, STOMACH, & DUODENUM &/OR JEJUNUM; DX W/WO COLLECTION SPECIMN, BY BRUSH OR WASH;  Surgeon: Janyth Pupa, MD;  Location: GI PROC[facility]URES [facility];  Service: Gastroenterology   ??? ROTATOR CUFF REPAIR Right 2020   ??? SPINE SURGERY       Family History   Problem Relation Age of Onset   ??? Hyperlipidemia Father    ??? Cancer Father    ??? Cancer Maternal Aunt    ??? Stroke Mother    ??? No Known Problems Sister    ??? No Known Problems Brother    ??? No Known Problems Maternal Uncle    ??? No Known Problems Paternal Aunt    ??? No Known Problems Paternal Uncle    ??? No Known Problems Maternal Grandmother    ??? No  Known Problems Maternal Grandfather    ??? No Known Problems Paternal Grandmother    ??? No Known Problems Paternal Grandfather    ??? Anesthesia problems Neg Hx    ??? Broken bones Neg Hx    ??? Clotting disorder Neg Hx    ??? Collagen disease Neg Hx    ??? Diabetes Neg Hx    ??? Dislocations Neg Hx    ??? Fibromyalgia Neg Hx    ??? Gout Neg Hx    ??? Hemophilia Neg Hx    ??? Osteoporosis Neg Hx    ??? Rheumatologic disease Neg Hx    ??? Scoliosis Neg Hx    ??? Severe sprains Neg Hx    ??? Sickle cell anemia Neg Hx    ??? Spinal Compression Fracture Neg Hx      Counseling given: Not Answered  Comment: Pt smokes 1ppd, Pt is interested in tobacco cessation     Counseling given: Not Answered  Comment: Pt smokes 1ppd, Pt is interested in tobacco cessation     Do you sometimes drink beer, wine, or other alcoholic beverages?: No (03/11/2019  3:00 PM)                  Social History     Substance and Sexual Activity   Drug Use Not Currently   ??? Types: Marijuana     Social History     Substance and Sexual Activity   Sexual Activity Yes   ??? Partners: Female   ??? Birth control/protection: Condom     Self-Efficacy Score:  SCORE: 6.67 (06/02/2019 11:52 AM)      Medications:   Outpatient Encounter Medications as of 06/02/2019   Medication Sig Dispense Refill   ??? diclofenac (VOLTAREN) 75 MG EC tablet TAKE 1 TABLET BY MOUTH TWICE DAILY FOR 14 DAYS. THEN TAKE ON AN AS NEEDED BASIS FOR PAIN     ??? dronabinoL (MARINOL) 10 MG capsule TAKE 1 CAPSULE BY MOUTH TWICE DAILY 30  MINUTES  BEFORE  A  MEAL 60 capsule 0   ??? gabapentin (NEURONTIN) 100 MG capsule Take 100 mg by mouth.     ??? gabapentin (NEURONTIN) 300 MG capsule Take 1 capsule (300 mg total) by mouth Three (3) times a day. Takes as needed 270 capsule 1   ??? HYDROcodone-acetaminophen (NORCO) 5-325 mg per tablet Take 1 tablet by mouth every six (6) hours as needed for pain. 30 tablet 0   ??? 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 naproxen (NAPROSYN) 500 MG tablet      ??? OLANZapine (ZYPREXA) 2.5 MG tablet Take 1 tablet (2.5 mg total) by mouth nightly. Take nightly for nausea. Can stop as nausea improves. 30 tablet 0   ??? oxyCODONE-acetaminophen (PERCOCET) 10-325 mg per tablet Take 1 tablet by mouth every eight (8) hours as needed for pain. 10 tablet 0   ??? sildenafil (VIAGRA) 50 MG tablet Take 50 mg by mouth daily as needed for erectile dysfunction.     ??? therapeutic multivitamin (THERAGRAN) tablet Take 1 tablet by mouth daily.     ??? ustekinumab (STELARA) 90 mg/mL Syrg syringe Inject the contents of 1 syringe (90 mg total) under the skin every 8 weeks. 1 mL 5   ??? [DISCONTINUED] dronabinoL (MARINOL) 10 MG capsule TAKE 1 CAPSULE BY MOUTH TWICE DAILY BEFORE A MEAL 60 capsule 0     No facility-administered encounter medications on file as of 06/02/2019.  Social History Review:        Programmer, applications: Low Risk    ??? Difficulty of Paying Living Expenses: Not hard at all         Food Insecurity: No Food Insecurity   ??? Worried About Programme researcher, broadcasting/film/video in the Last Year: Never true   ??? Ran Out of Food in the Last Year: Never true         Transportation Needs: No Transportation Needs   ??? Lack of Transportation (Medical): No   ??? Lack of Transportation (Non-Medical): No         Physical Activity:    ??? Days of Exercise per Week:    ??? Minutes of Exercise per Session:          Stress: Stress Concern Present   ??? Feeling of Stress : To some extent         Intimate Partner Violence: Not At Risk   ??? Fear of Current or Ex-Partner: No   ??? Emotionally Abused: No   ??? Physically Abused: No   ??? Sexually Abused: No         Alcohol Use: Not At Risk   ??? How often do you have 5 or more drinks on one occasion?: 0   ??? How many drinks containing alcohol do you have on a typical day when you are drinking?: 0   ??? How often do you have a drink containing alcohol?: 0         Tobacco Use: Medium Risk   ??? Smoking Tobacco Use: Former Smoker   ??? Smokeless Tobacco Use: Never Used         Depression: Not at risk   ??? PHQ-2 Score: 0        Upcoming Appointment (s):   Future Appointments   Date Time Provider Department Center   06/07/2019 12:30 PM Isaac Laud, PT [facility]   06/14/2019 12:30 PM Isaac Laud, PT [facility]   07/29/2019 12:30 PM Gonzella Lex, MD [facility]         This patient is currently Under Review for Intensive Case Management services through the [facility]. For progress, care plan changes, updates or recent discharges please contact CM.         Steffanie Rainwater

## 2019-06-03 NOTE — Unmapped (Signed)
I saw and evaluated the patient, participating in the key portions of the service.  I reviewed the resident’s note.  I agree with the resident’s findings and plan. Maribelle Hopple B Rehanna Oloughlin, MD

## 2019-06-07 NOTE — Unmapped (Signed)
[facility] PT [facility]  OUTPATIENT PHYSICAL THERAPY  06/07/2019  Note Type: Treatment Note       Patient Name: Rodney Bender [facility]  Date of Birth:May 07, 1970  Diagnosis:   Encounter Diagnoses   Name Primary?   ??? Traumatic tear of right rotator cuff, unspecified tear extent, subsequent encounter Yes   ??? Chronic right shoulder pain    ??? Post-operative state      Referring MD:  Gonzella Lex, MD   Plan of Care Effective Date: 04/14/19 - 08/14/19  Visit #: 7      Assessment & Plan     Assessment details: Pt presents for follow-up s/p right shoulder revision rotator cuff repair and biceps tenodesis on 03/19/2019.  Original rotator cuff repair surgery was on 10/02/18.  He is currently 11 weeks post-op.  He continues to do so much better, reporting 0/10 pain at onset and throughout today's treatment session. He was once again able to easily meet all PROM goals for this point post-op today and was able to tolerate progression of exercises (as outlined by his protocol) without complaint.  Pt will benefit from skilled PT services to address above impairments.       Impairments: decreased range of motion, decreased strength, pain and postural weakness        Personal Factors/Comorbidities: 3+  Specific Comorbidities: hx of previous R RTC repair, COVID-19 pandemic, anxiety, Crohn's  Examination of Body Systems: 4+ elements  Body System: posture, ROM, sensation, activity limitations, participation restrictions   Clinical Presentation: stable  Clinical Decision Making: low    Prognosis: fair      Negative Prognosis Rationale: Pain Status, chronicity of condition and severity of symptoms.        Therapy Goals  Goals: In 16 weeks:  1. Pt will be independent with home exercise program  2. Pt will demonstrate R shld AROM WFL in all planes to allow full mobility for bathing and dressing tasks.  3. Pt will be able to return to fishing without being limited by shoulder pain.  4. Pt will report 4/10 pain or less at all times to enable performance of daily activities with less pain.    Plan  Therapy options: will be seen for skilled physical therapy services    Planned therapy interventions: manual therapy, neuromuscular re-education, postural training, body mechanics training, education - patient, therapeutic exercises and home exercise program        Frequency: 1-2x per week.    Duration in weeks: up to 16 weeks    Education provided to: patient.  Education provided: importance of Therapy and HEP  Education Results: verbalized good understanding and needs reinforcement.  Communication/Consultation: Medicare Cert/POC sent to Referring Provider.  Next visit plan: continue to progress PROM and therapeutic exercise per protocol  Total Session Time: 27    Plan details: Will progress per massive RCR protocol due to being a revision          Subjective     History of Present Illness  Date of Onset: 10/22/2013  Date of Surgery: 03/19/2019  Date of Evaluation: 10/23/2018  Surgical Procedure: RCR (s/p R RCR revision with biceps tenodesis)  Reason for Referral/Chief Complaint: S/p right shoulder revision rotator cuff repair, biceps tenodesis on 03/19/2019.      Subjective: Doing alright No pain.  0/10 pain since last session.      Quality of life: good          Pain      Current  pain rating: 0      At best pain rating: 0      At worst pain rating: 10      Location: R shoulder constantly and shooting pains down R arm intermittently      Quality: aching, sharp and numbing (numbness in R hand)      Relieving factors: medications and ice      Aggravating factors: lying and performance of arm dominant activites (Pain during sleep in any position)      Pain Related Behaviors: none    Symptom course: Does not have pressure sensation in posterior R shoulder as before surgery.                  Current functional status: disturbed sleep, limited lifting and limited recreation      Precautions and Equipment  : follow massive RTC protocol.  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 Ivin Poot told him he didn't have to wear it anymore).    Right shoulder PASSIVE ROM (adhering to protocol limits):  155 degrees flexion  45 degrees ER    Full elbow extension     Treatment Rendered Today:    Manual Therapy: 15 minutes  PROM into elbow flexion/extension and forearm supination/pronation  PROM into shoulder ER  PROM into shoulder flexion  Grade II AP and inferior joint mobs: 2x30 each  Rhythmic stabilization at 90 deg flexion in supine: 3x30    Therapeutic exercise: 23 minutes  Re-assessment of symptoms  AAROM shoulder ER in supine with use of stick to 45 deg: x10 (HEP)  AAROM shoulder flexion with press up + flexion in supine with use of stick: 2x10 (HEP)  AROM shoulder flexion in supine: 2x5  AROM shoulder flexion on an incline: 2x5  50% submax ER isometric against PT: 3x5x5  50% submax IR isometric against PT: 3x5x5  AAROM flexion wall slide: 2x5 (HEP)  Ball on wall with 1# med ball: x20 circles x 2 sets  Quadruped positioning with small lateral weight shifts: 2x10  Scap squeezes no weight (HEP)      Total treatment time: 38 minutes      Patient wore a mask for the entire therapy session., Therapist wore a mask for the entire session.  and Therapist wore a face shield during the entire session.               Therapeutic Interventions  $$ Therapeutic Exercise [mins]: 23  $$ Manual Therapy [mins]: 15  I attest that I have reviewed the above information.  Signed: Ashok Cordia, PT  06/07/2019 1:20 PM

## 2019-06-09 NOTE — Unmapped (Signed)
[facility] Intensive Case Management  Assessment  NOTE  06/09/2019      Summary:  [facility] Case Manager spoke with patient and verified correct patient using two identifiers today for enrollment in Complex Case Management. Informed patient of Complex Case Management services. Pt has agreed to participate in the Complex Case Management program. Case Manager contact information was provided to patient.     General Case management Questions:     General Care Management - Patient Level    Assessment completed with: patient[KH1.1]  Patient lives with: spouse[KH1.1]  Support system: spouse or partner (Comment: emotional support;transportation )[KH1.1]  Type of residence: private residence[KH1.1]  Transportation means: personal vehicle[KH1.1]  Exercise: yes[KH1.1]  Times per week: 7[KH1.1]  Type of exercise: walking [KH1.1]  Follow special diet?: regular[KH1.1]  Interested in seeing dietician?: No[KH1.1]  Experiencing side effects from current medications: No[KH1.1]  Interested in seeing pharmacist?: No[KH1.1]  Difficulty keeping appointments: No[KH1.1]  Need assistance with community resources?: No[KH1.1]  Other significant issues impacting care?: None [KH1.1]     Attribution     KH1.1 Steffanie Rainwater 06/02/19 11:48             Primary Health Concerns:  What is your primary health concern?: Chron's    Assessment:  Completed By: Patient    Self-Health:  How would you describe your current physical health?: Good  How would you describe your current mental health?: Good     What things would cause you to go to the ED rather than your PCP/other provider?: patien't did not know     Oral Health - How would you describe the condition of your mouth and teeth including false teeth or dentures?: (!) Fair (patient has not gone to dentist, does not know if he has dental coverage)  Have you had a dental or oral exam in the last year?: (!) No    ACS Disability Status:   Are you deaf or do you have serious difficulty hearing?: No  Do you wear hearing aids?: No  Do you use any assistive devices to communicate?: No  Summary of Patients Health Status, Medication, and ACS disability status: Patient states that he is in good health. Patient has not visited a dentist in years, does not know what condition his teeth are in and does not know if he has dental coverage. Patient states he takes his medications as  prescribed. Patient prescribed 2 different narcotics.    Self Efficacy Assessment:                         Childhood Experiences:  Did you have any exposure to Adverse Childhood experiences or child trauma?: No    Pediatric ACE:                                     Support Summary:  Summary of Caregiver/Social Support: Patient has supportive spouse.    ADL/IADL:  Dressing - Ability to perform: Independent  Grooming - Ability to perform: Independent  Feeding - Ability to perform: Independent  Bathing - Ability to perform: Independent  Toileting - Ability to perform: Independent  Transfering- Ability to perform: Independent  Continence: Continent       Vision:  Patient's Vision Adequate to Safely Complete Daily Activities: (!) Yes (patient states he needs new glasses)    Driving Concerns:  Do you drive?: Yes   Mode of Transportation:  Car    Reading/Writing:  Are you able to read?: Yes   Are you able to write?: Yes  Oriented to person, place, time, situation: Oriented x 4    Patient/Family Memory Concerns:  Do you have concerns about your memory?: No   Does anyone in your family have concerns about your memory?: No    Learning and Disability:  History of attendance at a Special Education program or setting?: Yes (patient uncertain of which classes/subjects)   Intellectual Disability (formally Mental Retardation)?: No  Learning Disability?: Yes (patient unable to describe learning difficulty)    CM Assessment:  Describe the patient's cognitive status:: 1  Summary of ADL Initial Assessment and Cognitive Status: Patient is independent for ADLS/IADLS. Patient states he has no concerns with reading/writing/understanding instructions but stated he had some learning difficulty as a child. Patient states he needs glasses,    Barriers to Care:  What are the patient's barriers? (Select all that apply): (!) Teacher, music, Health Literacy    ACP Review:  ACP Reviewed: Yes    Chronic Pain:  Do you have chronic pain?: (!) Yes (abdomen-Gas pains)    Community Resources:         Dietary Needs:   Do you have any special dietary needs or restrictions?: No    Home Health:  Any Home Health?: PT/OT    Insurance Benefits:  What is your current understanding of your insurance benefits?: Good    Culture and Beliefs:  Is there anything I should know about your culture, beliefs, or religious practices that would help me take better care of you?: No    Life Planing Summary:  Summary of life planning, other identified patient care needs, and SDOH: Patient states his spouse would be his HCDM. Patient does not currently have ACP documents. Patient states he has no SDOH needs. Patient states he understands his Medicare/Medicaid coverage for his medications but does not know if he has resources for dental or vision.    CM Summary:  Patient needs/concerns addressed today:: HCDM-documented in EPIC, patient interested in ACP forms.  Care Plan items covered today:: mail ACP documents  Care plan items planned for next conversation:: f/u diet/weight/medication-Crohns, ACP documents        History Review:     Past Medical History:   Diagnosis Date   ??? Anxiety    ??? Crohn's disease (CMS-HCC)     diagnosed in 1990   ??? GERD (gastroesophageal reflux disease)    ??? Hypertension 10/08/2016     Caregiver burden Unanswered   Cognitive Impairment Unanswered   Falls Risk Unanswered   Financial difficulty Unanswered   Frail Elderly Unanswered   Hearing impairment/loss Unanswered   Homeless Unanswered   Impaired mobility Unanswered   Inadequate social/family support Unanswered   Ineffective family coping Unanswered   Low Literacy Unanswered   Nonadherence to medication Unanswered   Non-english speaking Unanswered   Terminal Illness/Hospice Unanswered   Transportation barriers Unanswered   Visual impairment Unanswered     Past Surgical History:   Procedure Laterality Date   ??? COLON SURGERY     ??? PR COLONOSCOPY FLX DX W/COLLJ SPEC WHEN PFRMD Left 11/10/2012    Procedure: COLONOSCOPY, FLEXIBLE, PROXIMAL TO SPLENIC FLEXURE; DIAGNOSTIC, W/WO COLLECTION SPECIMEN BY BRUSH OR WASH;  Surgeon: Malcolm Metro, MD;  [facility];  Service: Gastroenterology   ??? PR COLONOSCOPY FLX DX W/COLLJ SPEC WHEN PFRMD N/A 07/21/2013    Procedure: COLONOSCOPY, FLEXIBLE, PROXIMAL TO SPLENIC  FLEXURE; DIAGNOSTIC, W/WO COLLECTION SPECIMEN BY BRUSH OR WASH;  Surgeon: Gwen Pounds, MD;  Location: GI PROC[facility]URES [facility];  Service: Gastroenterology   ??? PR COLONOSCOPY FLX DX W/COLLJ SPEC WHEN PFRMD N/A 08/09/2016    Procedure: COLONOSCOPY, FLEXIBLE, PROXIMAL TO SPLENIC FLEXURE; DIAGNOSTIC, W/WO COLLECTION SPECIMEN BY BRUSH OR WASH;  Surgeon: Janyth Pupa, MD;  Location: GI PROCEDURES [facility];  Service: Gastroenterology   ??? PR COLONOSCOPY FLX DX W/COLLJ SPEC WHEN PFRMD N/A 04/06/2018    Procedure: COLONOSCOPY, FLEXIBLE, PROXIMAL TO SPLENIC FLEXURE; DIAGNOSTIC, W/WO COLLECTION SPECIMEN BY BRUSH OR WASH;  Surgeon: Zetta Bills, MD;  Location: [facility] [place] [facility];  Service: Gastroenterology   ??? PR COLONOSCOPY W/BIOPSY SINGLE/MULTIPLE  07/23/2012    Procedure: COLONOSCOPY, FLEXIBLE, PROXIMAL TO SPLENIC FLEXURE; WITH BIOPSY, SINGLE OR MULTIPLE;  Surgeon: Vickii Chafe, MD;  Location: GI PROCEDURES [facility];  Service: Gastroenterology   ??? PR COLONOSCOPY W/BIOPSY SINGLE/MULTIPLE N/A 07/15/2014    Procedure: COLONOSCOPY, FLEXIBLE, PROXIMAL TO SPLENIC FLEXURE; WITH BIOPSY, SINGLE OR MULTIPLE;  Surgeon: Janyth Pupa, MD;  Location: GI PROCEDURES [facility];  Service: Gastroenterology   ??? PR COLONOSCOPY W/BIOPSY SINGLE/MULTIPLE N/A 03/21/2017    Procedure: COLONOSCOPY, FLEXIBLE, PROXIMAL TO SPLENIC FLEXURE; WITH BIOPSY, SINGLE OR MULTIPLE;  Surgeon: Modena Nunnery, MD;  Location: [facility] [place] [facility];  Service: Gastroenterology   ??? PR COLONOSCOPY W/BIOPSY SINGLE/MULTIPLE N/A 02/12/2019    Procedure: COLONOSCOPY, FLEXIBLE, PROXIMAL TO SPLENIC FLEXURE; WITH BIOPSY, SINGLE OR MULTIPLE;  Surgeon: Jules Husbands, MD;  Location: [facility]URES [facility];  Service: Gastroenterology   ??? PR COLSC FLEXIBLE W/TRANSENDOSCOPIC BALLOON DILAT N/A 07/15/2014    Procedure: COLONOSCOPY, FLEXIBLE; WITH DILATION BY BALLOON, 1 OR MORE STRICTURES;  Surgeon: Janyth Pupa, MD;  Location: [facility]URES [facility];  Service: Gastroenterology   ??? PR REMVL COLON & TERM ILEUM W/ILEOCOLOSTOMY N/A 09/16/2016    Procedure: COLECTOMY, PARTIAL, WITH REMOVAL OF TERMINAL ILEUM WITH ILEOCOLOSTOMY;  Surgeon: Lady Gary, MD;  Location: MAIN OR [facility];  Service: Gastrointestinal   ??? PR REPAIR BICEPS LONG TENDON Right 03/19/2019    Procedure: TENODESIS LONG TENDON BICEPS;  Surgeon: Gonzella Lex, MD;  Location: ASC OR [facility];  Service: Orthopedics   ??? PR SHLDR ARTHROSCOP,PART ACROMIOPLAS Right 10/02/2018    Procedure: R16 ARTHROSCOPY, SHOULDER, SURGICAL; DECOMPRESS SUBACROMIAL SPACE W/PART ACROMIOPLASTY, Tamala Bari;  Surgeon: Gonzella Lex, MD;  Location: ASC OR [facility];  Service: Orthopedics   ??? PR SHLDR ARTHROSCOP,SURG,W/ROTAT CUFF REPR Right 10/02/2018    Procedure: ARTHROSCOPY, SHOULDER, SURGICAL; WITH ROTATOR CUFF REPAIR;  Surgeon: Gonzella Lex, MD;  Location: ASC OR [facility];  Service: Orthopedics   ??? PR SHLDR ARTHROSCOP,SURG,W/ROTAT CUFF REPR Right 03/19/2019    Procedure: R16 ARTHROSCOPY, SHOULDER, SURGICAL; WITH ROTATOR CUFF REPAIR;  Surgeon: Gonzella Lex, MD;  Location: ASC OR [facility]  Service: Orthopedics   ??? PR UPPER GI ENDOSCOPY,BIOPSY N/A 07/23/2012    Procedure: UGI ENDOSCOPY; WITH BIOPSY, SINGLE OR MULTIPLE;  Surgeon: Vickii Chafe, MD;  Location: GI PROCEDURES [facility];  Service: Gastroenterology   ??? PR UPPER GI ENDOSCOPY,DIAGNOSIS N/A 11/10/2012    Procedure: UGI ENDO, INCLUDE ESOPHAGUS, STOMACH, & DUODENUM &/OR JEJUNUM; DX W/WO COLLECTION SPECIMN, BY BRUSH OR WASH;  Surgeon: Malcolm Metro, MD;  Location: GI PROCEDURES [facility];  Service: Gastroenterology   ??? PR UPPER GI ENDOSCOPY,DIAGNOSIS N/A 07/21/2013    Procedure: UGI ENDO, INCLUDE ESOPHAGUS, STOMACH, & DUODENUM &/OR JEJUNUM; DX W/WO COLLECTION SPECIMN, BY BRUSH OR  [facility];  Surgeon: Gwen Pounds, MD;  Location: GI PROCEDURES [facility];  Service: Gastroenterology   ??? PR UPPER GI ENDOSCOPY,DIAGNOSIS N/A 07/15/2014    Procedure: UGI ENDO, INCLUDE ESOPHAGUS, STOMACH, & DUODENUM &/OR JEJUNUM; DX W/WO COLLECTION SPECIMN, BY BRUSH OR WASH;  Surgeon: Janyth Pupa, MD;  Location: GI PROCEDURES [facility];  Service: Gastroenterology   ??? ROTATOR CUFF REPAIR Right 2020   ??? SPINE SURGERY       Family History   Problem Relation Age of Onset   ??? Hyperlipidemia Father    ??? Cancer Father    ??? Cancer Maternal Aunt    ??? Stroke Mother    ??? No Known Problems Sister    ??? No Known Problems Brother    ??? No Known Problems Maternal Uncle    ??? No Known Problems Paternal Aunt    ??? No Known Problems Paternal Uncle    ??? No Known Problems Maternal Grandmother    ??? No Known Problems Maternal Grandfather    ??? No Known Problems Paternal Grandmother    ??? No Known Problems Paternal Grandfather    ??? Anesthesia problems Neg Hx    ??? Broken bones Neg Hx    ??? Clotting disorder Neg Hx    ??? Collagen disease Neg Hx    ??? Diabetes Neg Hx    ??? Dislocations Neg Hx    ??? Fibromyalgia Neg Hx    ??? Gout Neg Hx    ??? Hemophilia Neg Hx    ??? Osteoporosis Neg Hx    ??? Rheumatologic disease Neg Hx    ??? Scoliosis Neg Hx    ??? Severe sprains Neg Hx    ??? Sickle cell anemia Neg Hx    ??? Spinal Compression Fracture Neg Hx      Counseling given: Not Answered Comment: Pt smokes 1ppd, Pt is interested in tobacco cessation     Counseling given: Not Answered  Comment: Pt smokes 1ppd, Pt is interested in tobacco cessation     Do you sometimes drink beer, wine, or other alcoholic beverages?: No (03/11/2019  3:00 PM)                  Social History     Substance and Sexual Activity   Drug Use Not Currently   ??? Types: Marijuana     Social History     Substance and Sexual Activity   Sexual Activity Yes   ??? Partners: Female   ??? Birth control/protection: Condom       Medications:   Outpatient Encounter Medications as of 06/09/2019   Medication Sig Dispense Refill   ??? dronabinoL (MARINOL) 10 MG capsule TAKE 1 CAPSULE BY MOUTH TWICE DAILY 30  MINUTES  BEFORE  A  MEAL 60 capsule 0   ??? gabapentin (NEURONTIN) 300 MG capsule Take 1 capsule (300 mg total) by mouth Three (3) times a day. Takes as needed 270 capsule 1   ??? HYDROcodone-acetaminophen (NORCO) 5-325 mg per tablet Take 1 tablet by mouth every six (6) hours as needed for pain. 30 tablet 0   ??? 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 naproxen (NAPROSYN) 500 MG tablet      ??? OLANZapine (ZYPREXA) 2.5 MG tablet Take 1 tablet (2.5 mg total) by mouth nightly. Take nightly for nausea. Can stop as nausea improves. 30 tablet 0   ??? oxyCODONE-acetaminophen (PERCOCET) 10-325 mg per tablet Take 1 tablet  by mouth every eight (8) hours as needed for pain. 10 tablet 0   ??? ustekinumab (STELARA) 90 mg/mL Syrg syringe Inject the contents of 1 syringe (90 mg total) under the skin every 8 weeks. 1 mL 5   ??? diclofenac (VOLTAREN) 75 MG EC tablet TAKE 1 TABLET BY MOUTH TWICE DAILY FOR 14 DAYS. THEN TAKE ON AN AS NEEDED BASIS FOR PAIN (Patient not taking: Reported on 06/09/2019)     ??? gabapentin (NEURONTIN) 100 MG capsule Take 100 mg by mouth. (Patient not taking: Reported on 06/09/2019)     ??? sildenafil (VIAGRA) 50 MG tablet Take 50 mg by mouth daily as needed for erectile dysfunction.     ??? therapeutic multivitamin (THERAGRAN) tablet Take 1 tablet by mouth daily. (Patient not taking: Reported on 06/09/2019)     ??? [DISCONTINUED] dronabinoL (MARINOL) 10 MG capsule TAKE 1 CAPSULE BY MOUTH TWICE DAILY BEFORE A MEAL 60 capsule 0     No facility-administered encounter medications on file as of 06/09/2019.        Social History Review:        Programmer, applications: Low Risk    ??? Difficulty of Paying Living Expenses: Not hard at all         Food Insecurity: No Food Insecurity   ??? Worried About Programme researcher, broadcasting/film/video in the Last Year: Never true   ??? Ran Out of Food in the Last Year: Never true         Transportation Needs: No Transportation Needs   ??? Lack of Transportation (Medical): No   ??? Lack of Transportation (Non-Medical): No         Physical Activity:    ??? Days of Exercise per Week:    ??? Minutes of Exercise per Session:          Stress: Stress Concern Present   ??? Feeling of Stress : To some extent         Intimate Partner Violence: Not At Risk   ??? Fear of Current or Ex-Partner: No   ??? Emotionally Abused: No   ??? Physically Abused: No   ??? Sexually Abused: No         Alcohol Use: Not At Risk   ??? How often do you have 5 or more drinks on one occasion?: 0   ??? How many drinks containing alcohol do you have on a typical day when you are drinking?: 0   ??? How often do you have a drink containing alcohol?: 0         Tobacco Use: Medium Risk   ??? Smoking Tobacco Use: Former Smoker   ??? Smokeless Tobacco Use: Never Used         Depression: Not at risk   ??? PHQ-2 Score: 0         Patient Rights shared with patient: Yes    Upcoming Appointment (s):   Future Appointments   Date Time Provider Department Center   06/14/2019 12:30 PM Isaac Laud, PT [facility]   07/12/2019 12:30 PM Isaac Laud, PT [facility]   07/26/2019 12:30 PM Isaac Laud, PT [facility]   07/29/2019 12:30 PM Gonzella Lex, MD [facility]       This patient is currently receiving Intensive Case Management services through the [facility].      Primary Case Manager: Reynaldo Minium, RN CCM [phone]  Please contact CM for care plan changes, updates or  recent discharges.    High Risk Drivers: Complex Diagnosis  Primary Disease Process: Crohn's disease s/p intestinal bypass, chronic abdominal pain, rotator cuff injury right shoulder  Current Residence: Home with spouse  Primary Medical Home: Suanne Marker, MD???s office  [facility] [place]  Current services: PCP, GI, Ortho  Patient's Primary Concern is/goals are: Maintain Health and Improve chronic condition management  Barriers: Social Isolation and Health Literacy  Strengths: Self-advocacy and Family connection  Supports: Spouse/Partner  Interventions provided: Solicitor Information Provided, Introduction to ICM, Medication Reconciliation, Advanced Care Planning Education and Supportive Listening   Follow up with ICM Team Member: 2 weeks          Barnie Del, RN

## 2019-06-10 MED ORDER — DRONABINOL 10 MG CAPSULE
ORAL_CAPSULE | 0 refills | 0 days | Status: CP
Start: 2019-06-10 — End: ?

## 2019-06-10 NOTE — Unmapped (Signed)
[facility] Refill Coordination Note    Specialty Medication(s) to be Shipped:   Inflammatory Disorders: Stelara    Other medication(s) to be shipped:       Rodney Bender, DOB: [birth year]  Phone: [phone] (home)       All above HIPAA information was verified with patient.     Was a Nurse, learning disability used for this call? No    Completed refill call assessment today to schedule patient's medication shipment from the [facility] [phone]).       Specialty medication(s) and dose(s) confirmed: Regimen is correct and unchanged.   Changes to medications: [name] reports no changes at this time.  Changes to insurance: No  Questions for the pharmacist: No    Confirmed patient received Welcome Packet with first shipment. The patient will receive a drug information handout for each medication shipped and additional FDA Medication Guides as required.       DISEASE/MEDICATION-SPECIFIC INFORMATION        For patients on injectable medications: Patient currently has 0 doses left.  Next injection is scheduled for 05/17.    SPECIALTY MEDICATION ADHERENCE     Medication Adherence    Patient reported X missed doses in the last month: 0  Specialty Medication: stelara 90mg /ml  Patient is on additional specialty medications: No  Informant: patient  Reliability of informant: reliable  Patient is at risk for Non-Adherence: No                  stelara 90 mg/ml: 0 days of medicine on hand       SHIPPING     Shipping address confirmed in Epic.     Delivery Scheduled: Yes, Expected medication delivery date: 05/14.     Medication will be delivered via Same Day Courier to the prescription address in Epic WAM.    Antonietta Barcelona   [facility] Specialty Technician

## 2019-06-11 MED ORDER — DICLOFENAC SODIUM 75 MG TABLET,DELAYED RELEASE
ORAL_TABLET | 0 refills | 0 days
Start: 2019-06-11 — End: ?

## 2019-06-11 MED FILL — STELARA 90 MG/ML SUBCUTANEOUS SYRINGE: 56 days supply | Qty: 1 | Fill #5 | Status: AC

## 2019-06-11 MED FILL — STELARA 90 MG/ML SUBCUTANEOUS SYRINGE: SUBCUTANEOUS | 56 days supply | Qty: 1 | Fill #5

## 2019-06-14 NOTE — Unmapped (Signed)
[facility] PT [facility]  OUTPATIENT PHYSICAL THERAPY  06/14/2019  Note Type: Treatment Note       Patient Name: Rodney Bender [facility]  Date of Birth:05-30-1970  Diagnosis:   Encounter Diagnoses   Name Primary?   ??? Traumatic tear of right rotator cuff, unspecified tear extent, subsequent encounter Yes   ??? Chronic right shoulder pain    ??? Post-operative state      Referring MD:  Gonzella Lex, MD   Plan of Care Effective Date: 04/14/19 - 08/14/19  Visit #: 8      Assessment & Plan     Assessment details: Pt presents for follow-up s/p right shoulder revision rotator cuff repair and biceps tenodesis on 03/19/2019.  Original rotator cuff repair surgery was on 10/02/18.  He is currently 12 weeks post-op.  PROM and AROM are nearing WNL in all planes.  Pt reports occasional popping in shoulder but it is my hope that this will decrease now that we are able to start strengthening.  We initiated various theraband strengthening exercises for the rotator cuff and periscapular muscles, as outlined by the protocol, today and pt tolerated them well.  Pt will benefit from skilled PT services to address above impairments.       Impairments: decreased range of motion, decreased strength, pain and postural weakness        Personal Factors/Comorbidities: 3+  Specific Comorbidities: hx of previous R RTC repair, COVID-19 pandemic, anxiety, Crohn's  Examination of Body Systems: 4+ elements  Body System: posture, ROM, sensation, activity limitations, participation restrictions   Clinical Presentation: stable  Clinical Decision Making: low    Prognosis: fair      Negative Prognosis Rationale: Pain Status, chronicity of condition and severity of symptoms.        Therapy Goals  Goals: In 16 weeks:  1. Pt will be independent with home exercise program  2. Pt will demonstrate R shld AROM WFL in all planes to allow full mobility for bathing and dressing tasks.  3. Pt will be able to return to fishing without being limited by shoulder pain.  4. Pt will report 4/10 pain or less at all times to enable performance of daily activities with less pain.    Plan  Therapy options: will be seen for skilled physical therapy services    Planned therapy interventions: manual therapy, neuromuscular re-education, postural training, body mechanics training, education - patient, therapeutic exercises and home exercise program        Frequency: 1-2x per week.    Duration in weeks: up to 16 weeks    Education provided to: patient.  Education provided: importance of Therapy and HEP  Education Results: verbalized good understanding and needs reinforcement.  Communication/Consultation: Medicare Cert/POC sent to Referring Provider.  Next visit plan: continue to progress PROM and therapeutic exercise per protocol  Total Session Time: 45    Plan details: Will progress per massive RCR protocol due to being a revision          Subjective     History of Present Illness  Date of Onset: 10/22/2013  Date of Surgery: 03/19/2019  Date of Evaluation: 10/23/2018  Surgical Procedure: RCR (s/p R RCR revision with biceps tenodesis)  Reason for Referral/Chief Complaint: S/p right shoulder revision rotator cuff repair, biceps tenodesis on 03/19/2019.      Subjective: Doing alright.  Pt reports some popping in his shoulder with various movements then it aches for a little while afterwards.  Quality of life: good          Pain      Current pain rating: 0      At best pain rating: 0      At worst pain rating: 10      Location: R shoulder constantly and shooting pains down R arm intermittently      Quality: aching, sharp and numbing (numbness in R hand)      Relieving factors: medications and ice      Aggravating factors: lying and performance of arm dominant activites (Pain during sleep in any position)      Pain Related Behaviors: none    Symptom course: Does not have pressure sensation in posterior R shoulder as before surgery.                  Current functional status: disturbed sleep, limited lifting and limited recreation      Precautions and Equipment  : follow massive RTC protocol.  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 Ivin Poot told him he didn't have to wear it anymore).    Right shoulder PASSIVE ROM (adhering to protocol limits):  170 degrees flexion  70 degrees ER    Full elbow extension     Right shoulder ACTIVE ROM:  160 degrees flexion  65 degrees ER    Treatment Rendered Today:    Manual Therapy: 15 minutes  PROM into elbow flexion/extension and forearm supination/pronation  PROM into shoulder ER  PROM into shoulder flexion  Grade II AP and inferior joint mobs: 2x30 each  Rhythmic stabilization at 90 deg flexion in supine: 3x30    Therapeutic exercise: 30 minutes  Re-assessment of symptoms  Resisted FE in supine with light blue TB: 2x10 (+HEP)  SL ER with 1# weight: 3x10  ER with light blue TB: 2x10 (+HEP)  IR with light blue TB: 2x10 (+HEP)  Mid rowing with light TB: 2x10 (+HEP)  Low rowing with light TB - reproduced popping so discontinued      Total treatment time: 45 minutes      Patient wore a mask for the entire therapy session., Therapist wore a mask for the entire session.  and Therapist wore a face shield during the entire session.               Therapeutic Interventions  $$ Therapeutic Exercise [mins]: 30  $$ Manual Therapy [mins]: 15 I attest that I have reviewed the above information.  Signed: Ashok Cordia, PT  06/14/2019 4:31 PM

## 2019-06-21 MED ORDER — NAPROXEN 500 MG TABLET
ORAL_TABLET | 1 refills | 0 days
Start: 2019-06-21 — End: ?

## 2019-06-21 MED ORDER — DICLOFENAC SODIUM 75 MG TABLET,DELAYED RELEASE
ORAL_TABLET | 0 refills | 0 days
Start: 2019-06-21 — End: ?

## 2019-06-23 DIAGNOSIS — K50818 Crohn's disease of both small and large intestine with other complication: Principal | ICD-10-CM

## 2019-06-23 NOTE — Unmapped (Signed)
Addended by: Reynaldo Minium A on: 06/23/2019 04:52 PM     Modules accepted: Orders

## 2019-06-23 NOTE — Unmapped (Signed)
COMPLEX CASE MANAGEMENT   FOLLOW UP NOTE  Summary:  [facility] Case Manager spoke with patient and verified correct patient using two identifiers today for Complex Case Management follow up. Patient currently resides at Home. Primary concern is managing Crohn's disease and gaining weight.     Subjective:    Patient reported that he is trying to avoid greasy foods and minimize eating things that cause gi distress. Patient states that his goal is to gain 25 lbs.  Review dietary recommendations for Crohn's disease and high calorie/protein foods. Encouraged patient to keep a food journal to review calories, habits, and gi symptoms. Patient agreed to speak with a dietician. Discussed setting small behavioral change goals.    Patient interested in obtaining new glasses. Patient understands that he has some coverage with his Medicaid benefits. Will task [facility] to follow up with local vision centers.     Objective:     Screenings Completed during visit: None completed at this visit   Barriers to care: Cost of Care and Health Literacy    Interventions provided: RD Referral, Supportive Listening  and Nutrition education, goal setting education    Progress towards Crohn's management goal : On Track -placed RD referral, provided dietary education    Progress towards healthcare access goal: no change: [facility] to follow up with patient on community resources for vision screen for glasses    Plan:     1. Case Management to: Follow up in 2 weeks Place Care Coordinator referral for resources    2. Patient/caregiver to: Contact PCP/ICM as needs arise    Discuss at next outreach: Referral Follow-Up and dietary choices, dietary goal    Care Coordination Note updated in [facility]: Yes    Future Appointments   Date Time Provider Department Center   07/12/2019 12:30 PM Isaac Laud, PT [facility]   07/26/2019 12:30 PM Isaac Laud, PT [facility]   07/29/2019 12:30 PM Gonzella Lex, MD [facility]       Problem List     ??? Nausea    ??? Status post intestinal bypass or anastomosis (Chronic)    ??? Crohn's disease of both small and large intestine with other complication (Chronic)    ??? Rotator cuff syndrome of right shoulder    ??? ABDOMINAL PAIN    ??? Traumatic partial tear of right biceps tendon    ??? Hyponatremia    ??? High potassium levels          Allergies:   Allergies   Allergen Reactions   ??? Tramadol Itching and Anxiety     Other reaction(s): Other (See Comments)   ??? Penicillins      Other reaction(s): Itching-Allergy   ??? Dilaudid [Hydromorphone] Anxiety   ??? Morphine Itching   ??? Opioids - Morphine Analogues Itching       Medications:  Prior to Admission medications    Medication Dose, Route, Frequency   diclofenac (VOLTAREN) 75 MG EC tablet TAKE 1 TABLET BY MOUTH TWICE DAILY FOR 14 DAYS. 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   OLANZapine (ZYPREXA) 2.5 MG tablet 2.5 mg, Oral, Nightly, Take nightly for nausea. Can stop as nausea improves.   sildenafil (VIAGRA) 50 MG tablet 50 mg, Oral, Daily PRN   therapeutic multivitamin (THERAGRAN) tablet 1 tablet, Oral, Daily (standard)   ustekinumab (STELARA) 90 mg/mL Syrg syringe Inject the contents of 1 syringe (90 mg total) under the skin every 8 weeks.   dronabinoL (MARINOL) 10 MG capsule TAKE 1 CAPSULE BY MOUTH TWICE DAILY BEFORE A MEAL          Barnie Del, RN  06/23/2019

## 2019-06-24 NOTE — Unmapped (Signed)
Call received from Rodney Bender requesting his lab test results.

## 2019-06-28 LAB — TOXASSURE SELECT 13 (MW), URINE

## 2019-06-29 ENCOUNTER — Ambulatory Visit: Payer: Medicare Other | Admitting: Pain Medicine

## 2019-06-29 DIAGNOSIS — Z139 Encounter for screening, unspecified: Principal | ICD-10-CM

## 2019-06-29 NOTE — Unmapped (Signed)
I discussed lab results with patient, resent gc/chlam to [facility].

## 2019-06-30 NOTE — Unmapped (Signed)
Opened in error

## 2019-06-30 NOTE — Unmapped (Signed)
COMPLEX CASE MANAGEMENT   Brief Note    Care Coordinator contacted [facility] who states that they do not accept Medicare or Medicaid their office did provide information about possible Dental offices locally that may accept patients insurance. Care Coordinator voiced understanding and will outreach to these possible offices.     [facility]  [facility]  [facility]        Deliah Goody - High Risk Care Coordinator   [facility]  [address] [place], [place] [address]  P: [phone] F: [phone]  [email or web address]

## 2019-06-30 NOTE — Unmapped (Signed)
COMPLEX CASE MANAGEMENT   Brief Note    Care Coordinator left a message with [facility] at [phone] to determine if patient would be eligible for dental services at their location.       Deliah Goody - High Risk Care Coordinator   [facility]  [address], [address] [place], [place] [address]  P: [phone] F: [phone]  [email or web address] [email or web address]

## 2019-06-30 NOTE — Unmapped (Signed)
COMPLEX CASE MANAGEMENT   Brief Note    Care Coordinator spoke with patient today via telephone to advise on some of the dental and vision resources. Care Coordinator has advised patient that after speaking with multiple local resources the recommendation from their offices would be for patient to go onto [email or web address] and for patient to put in his Medicaid ID as well as zip code and it will pull up all local providers in his area that accept Medicaid. Patient has voiced understanding and has asked for Care Coordinator to mail him his ID # along with their website so he is able to look on the website for local providers. Care Coordinator has voiced understanding and will mail this information to patient. Patient has also verified mailing address with Care Coordinator on where he would like this information mailed.       Deliah Goody - High Risk Care Coordinator   [facility]  [address], [address] [place], [place] [address]  P: [phone] F: [phone]  [email or web address]

## 2019-06-30 NOTE — Unmapped (Signed)
COMPLEX CASE MANAGEMENT   Brief Note    Care Coordinator received a call from Crystal at [facility] who states that they are not currently in network with patients insurance as they are willing to see patient but she states that patient will not receive full insurance benefits. She has recommended that patient try the [employer] website to determine which providers are in network with his insurance company as Care Coordinator has voiced understanding and will provide this website to patient.     [email or web address]       Deliah Goody - High Risk Care Coordinator   [facility]  [address], [address] [place], [place] [address]  P: [phone] F: [phone]  [email or web address] [email or web address]

## 2019-07-01 MED ORDER — OXYCODONE-ACETAMINOPHEN 10 MG-325 MG TABLET
Freq: Three times a day (TID) | ORAL | 0 days | PRN
Start: 2019-07-01 — End: ?

## 2019-07-05 ENCOUNTER — Ambulatory Visit
Admit: 2019-07-05 | Discharge: 2019-07-19 | Payer: MEDICARE | Attending: Rehabilitative and Restorative Service Providers" | Primary: Rehabilitative and Restorative Service Providers"

## 2019-07-05 ENCOUNTER — Ambulatory Visit: Admit: 2019-07-05 | Discharge: 2019-07-06 | Payer: MEDICARE | Attending: Nutritionist | Primary: Nutritionist

## 2019-07-05 DIAGNOSIS — K50818 Crohn's disease of both small and large intestine with other complication: Principal | ICD-10-CM

## 2019-07-05 NOTE — Unmapped (Signed)
[facility] COMPLEX CASE MANAGEMENT  Patient Nutrition Referral    Date: 07/05/2019     Summary:  Registered Dietitian from Complex Case Management program spoke with patient.    Barriers to Care:  Multiple Complex Diagnoses, Cost of Care, Social Isolation, Health Literacy.    Problem List:  Crohn's Disease, s/p intestinal bypass, chronic abdominal pain, rotator cuff right shoulder injury.    Notes:  Referral from [place].    Called patient on mobile number.  Patient answered phone and stated that he didn't need to talk to the RD at this time.  Message sent to Case manager.  No follow up scheduled at this time.    Valerie Salts, MPH RD LDN [facility]

## 2019-07-06 NOTE — Unmapped (Signed)
[facility] [place]  OUTPATIENT PHYSICAL THERAPY  07/05/2019  Note Type: Treatment Note       Patient Name: Rodney Bender [facility]  Date of Birth:03/08/1970  Diagnosis:   Encounter Diagnoses   Name Primary?   ??? Traumatic tear of right rotator cuff, unspecified tear extent, subsequent encounter Yes   ??? Chronic right shoulder pain    ??? Post-operative state      Referring MD:  Gonzella Lex, MD   Plan of Care Effective Date: 04/14/19 - 08/14/19  Visit #: 8      Assessment & Plan     Assessment details: Pt returns to clinic for first time in 3 weeks due to scheduling difficulties.  Unfortunately, he reports that over the past month he has had increasing pain and difficulty raising his arm in supine.  In supine today, he demonstrates inability to lift right arm without assistance from left arm.  Interestingly, he could independently raise his right arm to full forward elevation in standing though.  It is overall difficult to assess what is causing pt's symptoms today.  Pt reports no known MOI for his increased pain except for possibly sleeping on his shoulder too much.  He is in obviously more pain than he was 3-4 weeks ago though with concern for possible re-tear, so I have advised the pt to follow-up with Dr. Ivin Poot.  Pt called Dr. Wilnette Kales office while in our clinic today and got an appointment for this Thursday.      Impairments: decreased range of motion, decreased strength, pain and postural weakness        Personal Factors/Comorbidities: 3+  Specific Comorbidities: hx of previous R RTC repair, COVID-19 pandemic, anxiety, Crohn's  Examination of Body Systems: 4+ elements  Body System: posture, ROM, sensation, activity limitations, participation restrictions   Clinical Presentation: stable  Clinical Decision Making: low    Prognosis: fair      Negative Prognosis Rationale: Pain Status, chronicity of condition and severity of symptoms.        Therapy Goals  Goals: In 16 weeks:  1. Pt will be independent with home exercise program  2. Pt will demonstrate R shld AROM WFL in all planes to allow full mobility for bathing and dressing tasks.  3. Pt will be able to return to fishing without being limited by shoulder pain.  4. Pt will report 4/10 pain or less at all times to enable performance of daily activities with less pain.    Plan  Therapy options: will be seen for skilled physical therapy services    Planned therapy interventions: manual therapy, neuromuscular re-education, postural training, body mechanics training, education - patient, therapeutic exercises and home exercise program        Frequency: 1-2x per week.    Duration in weeks: up to 16 weeks    Education provided to: patient.  Education provided: importance of Therapy and HEP  Education Results: verbalized good understanding and needs reinforcement.  Communication/Consultation: Medicare Cert/POC sent to Referring Provider.  Next visit plan: continue to progress PROM and therapeutic exercise per protocol  Total Session Time: 50    Plan details: Will progress per massive RCR protocol due to being a revision          Subjective     History of Present Illness  Date of Onset: 10/22/2013  Date of Surgery: 03/19/2019  Date of Evaluation: 10/23/2018  Surgical Procedure: RCR (s/p R RCR revision with biceps tenodesis)  Reason for Referral/Chief  Complaint: S/p right shoulder revision rotator cuff repair, biceps tenodesis on 03/19/2019.      Subjective: I'm having a problem with my shoulder.  Has had increasing pain and aching over the past month as well as difficulty/inability to raise his arm up while in supine position.  Denies any specific MOI but feels like it might be related to sleeping on his shoulder a lot.    Quality of life: good          Pain      Current pain rating: 9      At best pain rating: 0      At worst pain rating: 10      Location: R shoulder constantly and shooting pains down R arm intermittently      Quality: aching, sharp and numbing (numbness in R hand)      Relieving factors: medications and ice      Aggravating factors: lying and performance of arm dominant activites (Pain during sleep in any position)      Pain Related Behaviors: none    Symptom course: Does not have pressure sensation in posterior R shoulder as before surgery.                  Current functional status: disturbed sleep, limited lifting and limited recreation      Precautions and Equipment  : follow massive RTC protocol.  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 Ivin Poot told him he didn't have to wear it anymore).    Right shoulder PASSIVE ROM:  WNL in all planes      Right shoulder ACTIVE ROM in standing:  160 degrees flexion  65 degrees ER      Right shoulder ACTIVE ROM in supine:  Unable to perform flexion/forward elevation without assistance from L UE        Treatment Rendered Today:    Manual Therapy: 15 minutes  PROM into elbow flexion/extension and forearm supination/pronation  PROM into shoulder ER  PROM into shoulder flexion      Therapeutic exercise: 20 minutes  Extensive re-assessment of symptoms  Attempted exercises including scap squeezes and AAROM flexion with cane in supine but too painful      E-stim (Unattended): 15 minutes  IFC to R shoulder at pre-set settings and 8.8 mA with ice       Total treatment time: 50 minutes      Patient wore a mask for the entire therapy session., Therapist wore a mask for the entire  session.  and Therapist wore a face shield during the entire session.               Therapeutic Interventions  $$ Therapeutic Exercise [mins]: 20  $$ Manual Therapy [mins]: 15  Physical Agent Modalities  $$ Electrical Stimulation- Unattended [mins]: 15          I attest that I have reviewed the above information.  Signed: Ashok Cordia, PT  07/05/2019 10:35 PM

## 2019-07-07 NOTE — Unmapped (Signed)
COMPLEX CASE MANAGEMENT   FOLLOW UP NOTE  Summary:  Advocate Case Manager spoke with patient and verified correct patient using two identifiers today for Complex Case Management follow up. Patient currently resides at Home. Primary concern is managing Crohns disease and receiving routine dental care/eye exam.      Subjective:    Patient  reported that he is not interested in speaking with the dietician at this time. Patient states he is managing his Crohn's well and has had no gi distress or flare up symptoms.   Patient states he has not yet reviewed the resource information for dental and vision. Encouraged the patient to go ahead and review resources and to schedule an appointment.     Objective:     Screenings Completed during visit: None completed at this visit    Barriers to care: Cost of Care, Social Isolation and Health Literacy    Interventions provided: Supportive Listening , Community resources [employer]    Progress towards healthcare access goal : On Track -patient provided with community resources and [employer] ID. Patient encouraged to schedule appointments    Progress towards Crohn's management goal-No change-patient declined RD consult, states no symptoms at this time or concerns with treatment plan    Plan:     1. Case Management to: Follow up in 2 weeks    2. Patient/caregiver to: Contact PCP/ICM as needs arise      Discuss at next outreach: follow up GI, scheduling of dental /vision appts     Care Coordination Note updated in [facility]: Yes    Future Appointments   Date Time Provider Department Center   07/08/2019  9:45 AM Gonzella Lex, MD [facility]   07/12/2019 12:30 PM Isaac Laud, PT [facility]   07/26/2019 12:30 PM Isaac Laud, PT [facility]   07/29/2019 12:30 PM Gonzella Lex, MD [facility]       Problem List     ??? Nausea    ??? Status post intestinal bypass or anastomosis (Chronic)    ??? Crohn's disease of both small and large intestine with other complication (Chronic)    ??? Rotator cuff syndrome of right shoulder    ??? ABDOMINAL PAIN    ??? Traumatic partial tear of right biceps tendon    ??? Hyponatremia    ??? High potassium levels          Allergies:   Allergies   Allergen Reactions   ??? Tramadol Itching and Anxiety     Other reaction(s): Other (See Comments)   ??? Penicillins      Other reaction(s): Itching-Allergy   ??? Dilaudid [Hydromorphone] Anxiety   ??? Morphine Itching   ??? Opioids - Morphine Analogues Itching       Medications:  Prior to Admission medications    Medication Dose, Route, Frequency   diclofenac (VOLTAREN) 75 MG EC tablet TAKE 1 TABLET BY MOUTH TWICE DAILY FOR 14 DAYS. 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   OLANZapine (ZYPREXA) 2.5 MG tablet 2.5 mg, Oral, Nightly, Take nightly for nausea. Can stop as nausea improves.   sildenafil (VIAGRA) 50 MG tablet 50 mg, Oral, Daily PRN   therapeutic multivitamin (THERAGRAN) tablet 1 tablet, Oral, Daily (standard)   ustekinumab (STELARA) 90 mg/mL Syrg syringe Inject the contents of 1 syringe (90 mg total) under the skin every 8 weeks.   dronabinoL (MARINOL) 10 MG capsule TAKE 1 CAPSULE BY MOUTH TWICE DAILY BEFORE A MEAL          Barnie Del, RN  07/07/2019

## 2019-07-08 ENCOUNTER — Ambulatory Visit: Admit: 2019-07-08 | Discharge: 2019-07-09 | Payer: MEDICARE

## 2019-07-08 MED ADMIN — ropivacaine (NAROPIN) 5 mg/mL (0.5 %) injection 5 mL: 5 mL | SUBCUTANEOUS | @ 14:00:00 | Stop: 2019-07-08

## 2019-07-08 MED ADMIN — triamcinolone acetonide (KENALOG-40) injection 40 mg: 40 mg | INTRA_ARTICULAR | @ 14:00:00 | Stop: 2019-07-08

## 2019-07-08 NOTE — Unmapped (Signed)
I saw and evaluated the patient, participating in the key elements of the service.  I discussed the findings, assessment and plan with the resident and agree with resident???s findings and plan as documented in the resident's note.  I was immediately available for the entirety of the procedure(s) and present for the key and critical portions. Laxmi Choung T Enedelia Martorelli, MD

## 2019-07-08 NOTE — Unmapped (Signed)
Date of surgery: 03/19/2019.    Procedure performed: Revision right shoulder rotator cuff repair.     Prior procedure was 10/02/2018.    Return to clinic after aforementioned surgery.  Seems to be progressing okay with therapy.  Continues to endorse persistent pain and issues with forward elevation past 140.  Denies any trauma or injury.    Physical examination: Active forward flexion to 140, left side active forward flexion to 170,    Internal rotation right to approximately T12.  Internal rotation left to T10.    Strength: With the scapula stabilized and the elbow extended he has 4+/5 strength in the supraspinatus.    Assessment: Definite progress described by the patient with less pain and more function.    Plan: Reassured patient that overall range of motion and strength are congruent with injury and repair.  Encouraged continued therapy as well.  We spine with respect to his overall strength and mobility we expect him to essentially be able to advance as tolerated at this time. For pain relief we provided him with a subacromial injection today.  All his questions were answered.     Follow-up September 02, 2019.    A steroid injection was performed at right shoulder using 1% plain Lidocaine and 40 mg of Kenalog. This was well tolerated.

## 2019-07-08 NOTE — Unmapped (Signed)
Addended by: Erven Colla on: 07/08/2019 09:58 AM     Modules accepted: Orders

## 2019-07-12 NOTE — Unmapped (Signed)
[facility] PT [facility]   OUTPATIENT PHYSICAL THERAPY  07/12/2019  Note Type: Treatment Note       Patient Name: Rodney Bender [facility]  Date of Birth:1970-05-29  Diagnosis:   Encounter Diagnoses   Name Primary?   ??? Traumatic tear of right rotator cuff, unspecified tear extent, subsequent encounter Yes   ??? Chronic right shoulder pain    ??? Post-operative state      Referring MD:  Gonzella Lex, MD   Plan of Care Effective Date: 04/14/19 - 08/14/19  Visit #: 9      Assessment & Plan     Assessment details: Pt presents for follow-up s/p right shoulder revision rotator cuff repair and biceps tenodesis on 03/19/2019.  Original rotator cuff repair surgery was on 10/02/18.  He is currently 16 weeks post-op.  Pt followed-up with surgeon last week who feels everything is progressing as expected.  Received cortisone shot at appt last week which has helped to decrease patient's pain and improve his ability to lift his arm in supine.  Pt tolerated today's treatment session well, without complaints.  Pt will benefit from skilled PT services to address above impairments.       Impairments: decreased range of motion, decreased strength, pain and postural weakness        Personal Factors/Comorbidities: 3+  Specific Comorbidities: hx of previous R RTC repair, COVID-19 pandemic, anxiety, Crohn's  Examination of Body Systems: 4+ elements  Body System: posture, ROM, sensation, activity limitations, participation restrictions   Clinical Presentation: stable  Clinical Decision Making: low    Prognosis: fair      Negative Prognosis Rationale: Pain Status, chronicity of condition and severity of symptoms.        Therapy Goals  Goals: In 16 weeks:  1. Pt will be independent with home exercise program  2. Pt will demonstrate R shld AROM WFL in all planes to allow full mobility for bathing and dressing tasks.  3. Pt will be able to return to fishing without being limited by shoulder pain.  4. Pt will report 4/10 pain or less at all times to enable performance of daily activities with less pain.    Plan  Therapy options: will be seen for skilled physical therapy services    Planned therapy interventions: manual therapy, neuromuscular re-education, postural training, body mechanics training, education - patient, therapeutic exercises and home exercise program        Frequency: 1-2x per week.    Duration in weeks: up to 16 weeks    Education provided to: patient.  Education provided: importance of Therapy and HEP  Education Results: verbalized good understanding and needs reinforcement.  Communication/Consultation: Medicare Cert/POC sent to Referring Provider.  Next visit plan: continue to progress PROM and therapeutic exercise per protocol  Total Session Time: 40    Plan details: Will progress per massive RCR protocol due to being a revision          Subjective     History of Present Illness  Date of Onset: 10/22/2013  Date of Surgery: 03/19/2019  Date of Evaluation: 10/23/2018  Surgical Procedure: RCR (s/p R RCR revision with biceps tenodesis)  Reason for Referral/Chief Complaint: S/p right shoulder revision rotator cuff repair, biceps tenodesis on 03/19/2019.      Subjective: Doing alright.  Chart review shows that patient got a cortisone injection last week. Asked pt if that helped and he reports yes.      Quality of life: good  Pain      Current pain rating: 5      At best pain rating: 0      At worst pain rating: 10      Location: R shoulder constantly and shooting pains down R arm intermittently      Quality: aching, sharp and numbing (numbness in R hand)      Relieving factors: medications and ice      Aggravating factors: lying and performance of arm dominant activites (Pain during sleep in any position)      Pain Related Behaviors: none    Symptom course: Does not have pressure sensation in posterior R shoulder as before surgery.                  Current functional status: disturbed sleep, limited lifting and limited recreation Precautions and Equipment  : follow massive RTC protocol.  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    Right shoulder PASSIVE ROM (adhering to protocol limits):  170 degrees flexion  70 degrees ER    Full elbow extension     Right shoulder ACTIVE ROM:  160 degrees flexion  65 degrees ER    Treatment Rendered Today:    Manual Therapy: 15 minutes  PROM into elbow flexion/extension and forearm supination/pronation  PROM into shoulder ER  PROM into shoulder flexion  Grade II AP and inferior joint mobs: 2x30 each      Therapeutic exercise: 25 minutes  Re-assessment of symptoms  Rhythmic stabilization at 90 deg flexion in supine: 3x30  SL ER with 2# weight: 3x10 (+HEP)  ER with orange TB: 3x12 (+HEP)  Mid rowing with orange TB: 3x12 (+HEP)        Total treatment time: 40 minutes      Patient wore a mask for the entire therapy session., Therapist wore a mask for the entire session.  and Therapist wore a face shield during the entire session.               Therapeutic Interventions  $$ Therapeutic Exercise [mins]: 25  $$ Manual Therapy [mins]: 15             I attest that I have reviewed the above information.  Signed: Ashok Cordia, PT  07/12/2019 1:11 PM

## 2019-07-15 ENCOUNTER — Other Ambulatory Visit: Payer: Self-pay

## 2019-07-15 ENCOUNTER — Encounter: Payer: Self-pay | Admitting: Anesthesiology

## 2019-07-15 ENCOUNTER — Ambulatory Visit: Payer: Medicare Other | Attending: Anesthesiology | Admitting: Anesthesiology

## 2019-07-15 VITALS — BP 124/95 | HR 100 | Temp 99.1°F | Resp 16 | Ht 71.0 in | Wt 150.0 lb

## 2019-07-15 DIAGNOSIS — G8929 Other chronic pain: Secondary | ICD-10-CM

## 2019-07-15 DIAGNOSIS — F1190 Opioid use, unspecified, uncomplicated: Secondary | ICD-10-CM

## 2019-07-15 DIAGNOSIS — K5090 Crohn's disease, unspecified, without complications: Secondary | ICD-10-CM | POA: Diagnosis not present

## 2019-07-15 DIAGNOSIS — M545 Low back pain: Secondary | ICD-10-CM

## 2019-07-15 DIAGNOSIS — M542 Cervicalgia: Secondary | ICD-10-CM

## 2019-07-15 DIAGNOSIS — G894 Chronic pain syndrome: Secondary | ICD-10-CM | POA: Diagnosis not present

## 2019-07-15 DIAGNOSIS — M549 Dorsalgia, unspecified: Secondary | ICD-10-CM

## 2019-07-15 DIAGNOSIS — M47816 Spondylosis without myelopathy or radiculopathy, lumbar region: Secondary | ICD-10-CM

## 2019-07-15 DIAGNOSIS — R109 Unspecified abdominal pain: Secondary | ICD-10-CM | POA: Diagnosis not present

## 2019-07-15 MED ORDER — OXYCODONE-ACETAMINOPHEN 10-325 MG PO TABS: 1.0000 | ORAL_TABLET | Freq: Three times a day (TID) | ORAL | 0 refills | Status: DC | PRN

## 2019-07-15 MED ORDER — OXYCODONE-ACETAMINOPHEN 10-325 MG PO TABS: 1.0000 | ORAL_TABLET | Freq: Three times a day (TID) | ORAL | 0 refills | Status: AC | PRN

## 2019-07-15 NOTE — Progress Notes (Signed)
Nursing Pain Medication Assessment:  Safety precautions to be maintained throughout the outpatient stay will include: orient to surroundings, keep bed in low position, maintain call bell within reach at all times, provide assistance with transfer out of bed and ambulation.  Medication Inspection Compliance: Pill count conducted under aseptic conditions, in front of the patient. Neither the pills nor the bottle was removed from the patient's sight at any time. Once count was completed pills were immediately returned to the patient in their original bottle.  Medication: Oxycodone/APAP Pill/Patch Count: 24 of 75 pills remain Pill/Patch Appearance: Markings consistent with prescribed medication Bottle Appearance: Standard pharmacy container. Clearly labeled. Filled Date: 5 / 30 / 21 Last Medication intake:  Today
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
## 2019-07-21 NOTE — Unmapped (Signed)
COMPLEX CASE MANAGEMENT   FOLLOW UP NOTE  Summary:  [facility] Case Manager spoke with patient and verified correct patient using two identifiers today for Complex Case Management follow up. Patient currently resides at Home. Primary concern is managing Crohn's disease and scheduling dental/eye exams.       Subjective:    Patient  reported to not have scheduled his dental or eye appointment at this time. Encouraged patient to call and schedule, offered support if necessary.     Patient states he has no concerns with his Crohn's management at this time with diet or medication, no s/s flare ups . Patient states he knows the symptoms of when to contact his provider.     Objective:     Screenings Completed during visit: None completed at this visit    Barriers to care: Cost of Care and Health Literacy    Interventions provided: Supportive Listening  and Crohn's education    Progress towards Crohn's management goal : On Track -patient understands diet, medication, high risk symptoms    Progress towards Healthcare Access goal: No change-patient has resources, need to schedule, offered assistance if necessary    Plan:     1. Case Management to: Follow up in 4 weeks    2. Patient/caregiver [id number] PCP/ICM as needs arise, Patient to schedule appts-eye, dental    Discuss at next outreach: self monitoring, appointments    Care Coordination Note updated in [facility]: Yes    Future Appointments   Date Time Provider Department Center   07/26/2019 12:30 PM Isaac Laud, PT [facility]   09/02/2019  9:45 AM Gonzella Lex, MD [facility]       Problem List     ??? Nausea    ??? Status post intestinal bypass or anastomosis (Chronic)    ??? Crohn's disease of both small and large intestine with other complication (Chronic)    ??? Rotator cuff syndrome of right shoulder    ??? ABDOMINAL PAIN    ??? Traumatic partial tear of right biceps tendon    ??? Hyponatremia    ??? High potassium levels          Allergies:   Allergies Allergen Reactions   ??? Tramadol Itching and Anxiety     Other reaction(s): Other (See Comments)   ??? Penicillins      Other reaction(s): Itching-Allergy   ??? Dilaudid [Hydromorphone] Anxiety   ??? Morphine Itching   ??? Opioids - Morphine Analogues Itching       Medications:  Prior to Admission medications    Medication Dose, Route, Frequency   diclofenac (VOLTAREN) 75 MG EC tablet TAKE 1 TABLET BY MOUTH TWICE DAILY FOR 14 DAYS. THEN TAKE ON AN AS NEEDED BASIS FOR PAIN  Patient not taking: Reported on 06/09/2019   dronabinoL (MARINOL) 10 MG capsule TAKE 1 CAPSULE BY MOUTH TWICE DAILY 30  MINUTES  BEFORE  MEALS   gabapentin (NEURONTIN) 100 MG capsule 100 mg  Patient not taking: Reported on 06/09/2019   gabapentin (NEURONTIN) 300 MG capsule 300 mg, Oral, 3 times a day (standard), Takes as needed   naloxone (NARCAN) 4 mg nasal spray For respiratory depression from opioids   naproxen (NAPROSYN) 500 MG tablet No dose, route, or frequency recorded.   OLANZapine (ZYPREXA) 2.5 MG tablet 2.5 mg, Oral, Nightly, Take nightly for nausea. Can stop as nausea improves.   oxyCODONE-acetaminophen (PERCOCET) 10-325 mg per tablet 1 tablet, Oral, Every 8 hours PRN   sildenafil (  VIAGRA) 50 MG tablet 50 mg, Oral, Daily PRN   therapeutic multivitamin (THERAGRAN) tablet 1 tablet, Oral, Daily (standard)   ustekinumab (STELARA) 90 mg/mL Syrg syringe Inject the contents of 1 syringe (90 mg total) under the skin every 8 weeks.   dronabinoL (MARINOL) 10 MG capsule TAKE 1 CAPSULE BY MOUTH TWICE DAILY BEFORE A MEAL          Barnie Del, RN  07/21/2019

## 2019-07-26 ENCOUNTER — Telehealth: Payer: Self-pay | Admitting: Anesthesiology

## 2019-07-26 ENCOUNTER — Ambulatory Visit
Admit: 2019-07-26 | Discharge: 2019-08-18 | Payer: MEDICARE | Attending: Rehabilitative and Restorative Service Providers" | Primary: Rehabilitative and Restorative Service Providers"

## 2019-07-26 NOTE — Telephone Encounter (Signed)
Ok per Dr. Andree Elk for patient to fill script on 07/29/2019. Patient and pharmacy called. Patient understands that script must last until 08/30/19- when his next script is due.

## 2019-07-26 NOTE — Telephone Encounter (Signed)
Patient's last fill was 06/03. Benjamin Murillo he has taken a few extra because he had shoulder surgery. Will ask Dr. Andree Elk if he can fill a few days early and call patient back.

## 2019-07-26 NOTE — Telephone Encounter (Signed)
Pt called stating Benjamin Murillo in [place] said that he doesn't have a prescription for this month and he is out of meds

## 2019-07-26 NOTE — Unmapped (Signed)
[facility] PT [facility]  OUTPATIENT PHYSICAL THERAPY  07/26/2019  Note Type: Discharge Note       Patient Name: Rodney Bender [facility]  Date of Birth:Apr 25, 1970  Diagnosis:   Encounter Diagnoses   Name Primary?   ??? Traumatic tear of right rotator cuff, unspecified tear extent, subsequent encounter Yes   ??? Chronic right shoulder pain    ??? Post-operative state      Referring MD:  Gonzella Lex, MD   Plan of Care Effective Date: 04/14/19 - 08/14/19  Visit #: [id number]      Assessment & Plan     Assessment details: Pt has been seen in physical therapy for 10 visits s/p right shoulder revision rotator cuff repair and biceps tenodesis on 03/19/2019.  Original rotator cuff repair surgery was on 10/02/18.  He is currently 18 weeks post-op.  His R shoulder ROM is now equal and symmetric to his L shoulder ROM in all planes.  Unfortunately, however, he continues to report episodes of 7/10 pain in his R shoulder which he thinks is from laying on his shoulder at night.  Despite this pain, pt desired to be done with physical therapy so he was discharged today, and I have advised him to continue his HEP which he is compliant and independent with.      Impairments: pain and postural weakness        Personal Factors/Comorbidities: 3+  Specific Comorbidities: hx of previous R RTC repair, COVID-19 pandemic, anxiety, Crohn's  Examination of Body Systems: 4+ elements  Body System: posture, ROM, sensation, activity limitations, participation restrictions   Clinical Presentation: stable  Clinical Decision Making: low    Prognosis: fair      Negative Prognosis Rationale: Pain Status, chronicity of condition and severity of symptoms.        Therapy Goals  Goals: In 16 weeks:  1. Pt will be independent with home exercise program - goal met  2. Pt will demonstrate R shld AROM WFL in all planes to allow full mobility for bathing and dressing tasks. - goal met  3. Pt will be able to return to fishing without being limited by shoulder pain. - goal not attempted  4. Pt will report 4/10 pain or less at all times to enable performance of daily activities with less pain. - goal not met    Plan              Education provided to: patient.  Education provided: importance of Therapy and HEP  Education Results: verbalized good understanding and demonstrates understanding.  Communication/Consultation: n/a.  Next visit plan: Pt will be discharged from PT at this time.  He has been instructed to contact our office if any additional questions or problems arise.  Total Session Time: 40    Plan details: Will progress per massive RCR protocol due to being a revision          Subjective     History of Present Illness  Date of Onset: 10/22/2013  Date of Surgery: 03/19/2019  Date of Evaluation: 10/23/2018  Surgical Procedure: RCR (s/p R RCR revision with biceps tenodesis)  Reason for Referral/Chief Complaint: S/p right shoulder revision rotator cuff repair, biceps tenodesis on 03/19/2019.      Subjective: Not feeling well today.  Stomach very upset and nauseous.  Shoulder has been a little tender but when asked to rate his pain he rates it a 7/10.    Quality of life: good  Pain      Current pain rating: 7      At best pain rating: 0      At worst pain rating: 10      Location: R shoulder constantly and shooting pains down R arm intermittently      Quality: aching, sharp and numbing (numbness in R hand)      Relieving factors: medications and ice      Aggravating factors: lying and performance of arm dominant activites (Pain during sleep in any position)      Pain Related Behaviors: none    Symptom course: Does not have pressure sensation in posterior R shoulder as before surgery.                  Current functional status: disturbed sleep, limited lifting and limited recreation      Precautions and Equipment  : follow massive RTC protocol.  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    Right shoulder ACTIVE ROM in supine:  160 degrees flexion  65 degrees ER    Right shoulder ACTIVE ROM in standing:  140 degrees flexion vs 140 degrees flexion on L  130 degrees abduction vs 130 degrees abduction on L  Functional IR L1 bilaterally    ER lag sign: negative  Drop arm test: negative    MMT:  4/5 (B) in shoulder ER  5/5 (B) in shoulder IR  4/5 (B) in shoulder abduction    Treatment Rendered Today:    Manual Therapy: 15 minutes  PROM into elbow flexion/extension and forearm supination/pronation  PROM into shoulder ER  PROM into shoulder flexion  Grade II AP and inferior joint mobs: 2x30 each      Therapeutic exercise: 25 minutes  Discharge assessment  ER with orange TB: 3x12 (HEP)  Mid rowing with orange TB: 3x12 (HEP)  Unilateral low rowing with orange TB: 3x10  ER with 4# weight in standing: 2x10  1# med ball on wall with CW and CCW circles: x20  2# med ball on wall with CW and CCW circles: x2j0  2# med ball on wall drawing lower case ABCs: x1 time through  Serratus anterior push ups on raised mat table: x10 (+HEP)  Pulley into shoulder flexion: x10          Total treatment time: 40 minutes      Patient wore a mask for the entire therapy session., Therapist wore a mask for the entire session.  and  Therapist wore a face shield during the entire session.               Therapeutic Interventions  $$ Therapeutic Exercise [mins]: 25  $$ Manual Therapy [mins]: 15             I attest that I have reviewed the above information.  Signed: Ashok Cordia, PT  07/26/2019 4:41 PM

## 2019-07-27 ENCOUNTER — Telehealth: Payer: Self-pay | Admitting: Anesthesiology

## 2019-07-27 NOTE — Telephone Encounter (Signed)
Pt called and stated that the pharmacy states they haven't gotten the ok from [place] to fill rx early.

## 2019-07-28 ENCOUNTER — Other Ambulatory Visit: Payer: Self-pay | Admitting: Anesthesiology

## 2019-07-28 DIAGNOSIS — K5000 Crohn's disease of small intestine without complications: Principal | ICD-10-CM

## 2019-07-28 MED ORDER — STELARA 90 MG/ML SUBCUTANEOUS SYRINGE
SUBCUTANEOUS | 5 refills | 56 days
Start: 2019-07-28 — End: ?

## 2019-07-28 NOTE — Telephone Encounter (Signed)
Spoke with patient and he states [facility] does not have approval to fill his medication early.  Checked phone messages and Dr Andree Elk has approved an early fill for July 29, 2019.  Called to [facility], ended up leaving voicemail with them that it is okay to fill on July 29, 2019 and if they have any questions to please call our office.

## 2019-07-28 NOTE — Unmapped (Signed)
[facility] [place] [facility] Clinical Assessment & Refill Coordination Note    Rodney Bender [place], [place]: 1970/08/01  Phone: [phone] (home)     All above HIPAA information was verified with patient.     Was a Nurse, learning disability used for this call? No    Specialty Medication(s):   Inflammatory Disorders: Stelara     Current Outpatient Medications   Medication Sig Dispense Refill   ??? diclofenac (VOLTAREN) 75 MG EC tablet TAKE 1 TABLET BY MOUTH TWICE DAILY FOR 14 DAYS. THEN TAKE ON AN AS NEEDED BASIS FOR PAIN (Patient not taking: Reported on 06/09/2019)     ??? dronabinoL (MARINOL) 10 MG capsule TAKE 1 CAPSULE BY MOUTH TWICE DAILY 30  MINUTES  BEFORE  MEALS 60 capsule 0   ??? gabapentin (NEURONTIN) 100 MG capsule Take 100 mg by mouth. (Patient not taking: Reported on 06/09/2019)     ??? gabapentin (NEURONTIN) 300 MG capsule Take 1 capsule (300 mg total) by mouth Three (3) times a day. Takes as needed 270 capsule 1   ??? 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 naproxen (NAPROSYN) 500 MG tablet      ??? OLANZapine (ZYPREXA) 2.5 MG tablet Take 1 tablet (2.5 mg total) by mouth nightly. Take nightly for nausea. Can stop as nausea improves. 30 tablet 0   ??? oxyCODONE-acetaminophen (PERCOCET) 10-325 mg per tablet Take 1 tablet by mouth every eight (8) hours as needed.     ??? sildenafil (VIAGRA) 50 MG tablet Take 50 mg by mouth daily as needed for erectile dysfunction.     ??? therapeutic multivitamin (THERAGRAN) tablet Take 1 tablet by mouth daily.      ??? ustekinumab (STELARA) 90 mg/mL Syrg syringe Inject the contents of 1 syringe (90 mg total) under the skin every 8 weeks. 1 mL 5     No current facility-administered medications for this visit.        Changes to medications: Rodney Bender reports no changes at this time.    Allergies   Allergen Reactions   ??? Tramadol Itching and Anxiety     Other reaction(s): Other (See Comments)   ??? Penicillins      Other reaction(s): Itching-Allergy   ??? Dilaudid [Hydromorphone] Anxiety   ??? Morphine Itching   ??? Opioids - Morphine Analogues Itching       Changes to allergies: No    SPECIALTY MEDICATION ADHERENCE     Stelara 90 mg/ml: 0 days of medicine on hand       Medication Adherence    Patient reported X missed doses in the last month: 0  Specialty Medication: Stelara  Patient is on additional specialty medications: No  Informant: patient          Specialty medication(s) dose(s) confirmed: Regimen is correct and unchanged.     Are there any concerns with adherence? No    Adherence counseling provided? Not needed    CLINICAL MANAGEMENT AND INTERVENTION      Clinical Benefit Assessment:    Do you feel the medicine is effective or helping your condition? Yes    Clinical Benefit counseling provided? Not needed    Adverse Effects Assessment:    Are you experiencing any side effects? No    Are you experiencing difficulty administering your medicine? No    Quality of Life Assessment:    How many days over the past month did your Crohn's  keep you from your normal activities?  For example, brushing your teeth or getting up in the morning. 0    Have you discussed this with your provider? Not needed    Therapy Appropriateness:    Is therapy appropriate? Yes, therapy is appropriate and should be continued    DISEASE/MEDICATION-SPECIFIC INFORMATION      For patients on injectable medications: Patient currently has 0 doses left.  Next injection is scheduled for 08/07/19.    PATIENT SPECIFIC NEEDS     - Does the patient have any physical, cognitive, or cultural barriers? No    - Is the patient high risk? No     - Does the patient require a Care Management Plan? No     - Does the patient require physician intervention or other additional services (i.e. nutrition, smoking cessation, social work)? No      SHIPPING     Specialty Medication(s) to be Shipped:   Inflammatory Disorders: Stelara    Other medication(s) to be shipped: none     Changes to insurance: No    Delivery Scheduled: Yes, Expected medication delivery date: 08/04/19.  However, Rx request for refills was sent to the provider as there are none remaining.     Medication will be delivered via Same Day Courier to the confirmed prescription address in [facility].    The patient will receive a drug information handout for each medication shipped and additional FDA Medication Guides as required.  Verified that patient has previously received a Conservation officer, historic buildings.    All of the patient's questions and concerns have been addressed.    Rodney Bender   [facility] Specialty Pharmacist

## 2019-07-29 MED ORDER — STELARA 90 MG/ML SUBCUTANEOUS SYRINGE
SUBCUTANEOUS | 5 refills | 56.00000 days | Status: CP
Start: 2019-07-29 — End: ?
  Filled 2019-08-04: qty 1, 56d supply, fill #0

## 2019-07-29 NOTE — Telephone Encounter (Signed)
Anderson Malta took care of letting pharmacy know about early fill and refill for Gabapentin.

## 2019-07-30 MED ORDER — DRONABINOL 10 MG CAPSULE
ORAL_CAPSULE | 0 refills | 0 days | Status: CP
Start: 2019-07-30 — End: ?

## 2019-08-04 MED FILL — STELARA 90 MG/ML SUBCUTANEOUS SYRINGE: 56 days supply | Qty: 1 | Fill #0 | Status: AC

## 2019-08-11 NOTE — Unmapped (Signed)
Patient triage complete for appt

## 2019-08-12 ENCOUNTER — Ambulatory Visit: Admit: 2019-08-12 | Discharge: 2019-08-13 | Payer: MEDICARE

## 2019-08-12 MED ORDER — SCOPOLAMINE 1 MG OVER 3 DAYS TRANSDERMAL PATCH
MEDICATED_PATCH | TRANSDERMAL | 12 refills | 30 days | Status: CP
Start: 2019-08-12 — End: 2020-08-11

## 2019-08-12 MED ORDER — LOPERAMIDE 2 MG TABLET: 2 mg | tablet | Freq: Two times a day (BID) | 0 refills | 15 days | Status: AC

## 2019-08-12 MED ORDER — GABAPENTIN 300 MG CAPSULE
ORAL_CAPSULE | Freq: Three times a day (TID) | ORAL | 1 refills | 90.00000 days | Status: CP
Start: 2019-08-12 — End: 2020-02-08

## 2019-08-12 MED ORDER — LOPERAMIDE 2 MG TABLET
ORAL_TABLET | Freq: Two times a day (BID) | ORAL | 0 refills | 0.00000 days | Status: CP | PRN
Start: 2019-08-12 — End: ?

## 2019-08-12 MED ORDER — CITALOPRAM 10 MG TABLET
ORAL_TABLET | Freq: Every day | ORAL | 3 refills | 0 days | Status: CP | PRN
Start: 2019-08-12 — End: 2020-08-11

## 2019-08-12 MED ORDER — CITALOPRAM 10 MG TABLET: 10 mg | tablet | Freq: Every day | 3 refills | 90 days | Status: AC

## 2019-08-12 NOTE — Unmapped (Signed)
[facility] CL[facility] VISIT    REFERRING PROVIDER: Suanne Marker, MD  [address]  [place],  [place] [address]    PRIMARY CARE PROVIDER: Suanne Marker, MD    PATIENT PROFILE: Rodney Bender is a 49 y.o. man who is seen in consultation at the request of Dr. Domingo Madeira for evaluation of Ileal Crohn's disease.    ASSESSMENT AND PLAN:     Rodney Bender is a 49 y.o. man with history significant for ileal stricturing Crohn's disease s/p four ileocecal resections with a long history of adherence issues and fractured care across institutions who presents for routine follow-up.    Ileal stricturing Crohn's disease: I think very well controlled at this time, symptoms minimal and at chronic baseline, gaining some weight though still quite thin. We should recheck B-12 and TSH. He has a fairly recent staging scope and doesn't need screening for absence of colonic disease.  I wonder if his chronic symptoms though somewhat his baseline could be suggestive of recurrent Crohn's disease disease about a year after surgery.  Given we did not find any thing on a fairly recent colonoscopy we will proceed with CT enterography.  -CT enterography  -Continue Stellara  -Continue Dronabinol for appetite support  -Counseled on risks of chronic opioids    Nausea with opioid withdrawal: He gets nausea when he skips a dose of his chronic opioids but is trying to decrease doses over time and would like to avoid taking the opioid to resolve the nausea, which it does.  He has tried typical antiemetics at home.  We will try scopolamine patch for nausea and also try Imodium which acts as an opioid in the gut but does not cross the blood-brain barrier.  -Scopolamine patch prn    Routine STI screening: patient wants GC/Chlamydia screening, no symptoms, denies any high risk encounters.    Health maintenance in Crohn's disease: has had pneumococcal immunizations, declines influenza vaccination.   - Declines hep a and b vaccine  - Declines influenza  - Declines covid, discussed in detail    Patient Instructions 1.   Return in about 3 months (around 11/12/2019). Schedule at the check-out desk or call [phone] to schedule follow-up.  2. Reach out to [place] at my e-mail or by calling Robin at the numbers below if you have any questions or problems in the interim.  3. If you do not receive a call to schedule any tests or visits with doctors we refer you to, please call the scheduling numbers below or reach out to my e-mail or Robin for help scheduling.  4. Try loperamide 2 mg twice daily on days you skip the opioids.  5. Try the scopolamine patch on those days as well.  6. We will refill Celexa and gabapentin.  7. We will do routine screening for gonorrhea and chlamydia.    -------------------------------------------------------------------------------    If you have any questions or concerns please contact us on MyChart or as below:    For appointments, please call [phone].  For scheduling radiology appointments [phone].  For scheduling GI procedures (e.g,. Colonoscopy), please call [phone].  For emergencies after hours call [phone] and ask for the GI medicine fellow on call.    ==============================================      Return in about 3 months (around 11/12/2019).    Hunt Oris, MD  Gastroenterology and Hepatology Fellow (PGY-4)  [email or web address] (patient's providers should feel free to reach out with any questions whatsoever)    The  patient was seen and discussed with Dr. Marcella Dubs, who agreed with the above assessment and plan.    SUBJECTIVE:     CHIEF COMPLAINT: Follow-up    HISTORY OF PRESENT ILLNESS:      Rodney Bender is a 49 y.o. man with history significant for ileal stricturing Crohn's disease s/p four ileocecal resections with a long history of adherence issues and fractured care across institutions who presents for routine follow-up.    He takes oxycodone 1-2 times a day, started getting nausea/vomiting when he skips them. He has tried typical antiemetics at home.     REVIEW OF SYSTEMS:   The balance of 12 systems reviewed is negative except as noted in the history of present illness.    Past Medical History:   Diagnosis Date   ??? Anxiety    ??? Crohn's disease (CMS-HCC)     diagnosed in 1990   ??? GERD (gastroesophageal reflux disease)    ??? Hypertension 10/08/2016     Past Surgical History:   Procedure Laterality Date   ??? COLON SURGERY     ??? PR COLONOSCOPY FLX DX W/COLLJ SPEC WHEN PFRMD Left 11/10/2012    Procedure: COLONOSCOPY, FLEXIBLE, PROXIMAL TO SPLENIC FLEXURE; DIAGNOSTIC, W/WO COLLECTION SPECIMEN BY BRUSH OR WASH;  Surgeon: Malcolm Metro, MD;  [facility];  Service: Gastroenterology   ??? PR COLONOSCOPY FLX DX W/COLLJ SPEC WHEN PFRMD N/A 07/21/2013    Procedure: COLONOSCOPY, FLEXIBLE, PROXIMAL TO SPLENIC FLEXURE; DIAGNOSTIC, W/WO COLLECTION SPECIMEN BY BRUSH OR WASH;  Surgeon: Gwen Pounds, MD;  [facility]: [facility];  Service: Gastroenterology   ??? PR COLONOSCOPY FLX DX W/COLLJ SPEC WHEN PFRMD N/A 08/09/2016    Procedure: COLONOSCOPY, FLEXIBLE, PROXIMAL TO SPLENIC FLEXURE; DIAGNOSTIC, W/WO COLLECTION SPECIMEN BY BRUSH OR WASH;  Surgeon: Janyth Pupa, MD;  Location: [facility];  Service: Gastroenterology   ??? PR COLONOSCOPY FLX DX W/COLLJ SPEC WHEN PFRMD N/A 04/06/2018    Procedure: COLONOSCOPY, FLEXIBLE, PROXIMAL TO SPLENIC FLEXURE; DIAGNOSTIC, W/WO COLLECTION SPECIMEN BY BRUSH OR WASH;  Surgeon: Zetta Bills, MD;  [facility];  Service: Gastroenterology   ??? PR COLONOSCOPY W/BIOPSY SINGLE/MULTIPLE  07/23/2012    Procedure: COLONOSCOPY, FLEXIBLE, PROXIMAL TO SPLENIC FLEXURE; WITH BIOPSY, SINGLE OR MULTIPLE;  Surgeon: Vickii Chafe, MD;  Location: [facility];  Service: Gastroenterology   ??? PR COLONOSCOPY W/BIOPSY SINGLE/MULTIPLE N/A 07/15/2014    Procedure: COLONOSCOPY, FLEXIBLE, PROXIMAL TO SPLENIC FLEXURE; WITH BIOPSY, SINGLE OR MULTIPLE;  Surgeon: Janyth Pupa, MD;  Location: [facility];  Service: Gastroenterology   ??? PR COLONOSCOPY W/BIOPSY SINGLE/MULTIPLE N/A 03/21/2017    Procedure: COLONOSCOPY, FLEXIBLE, PROXIMAL TO SPLENIC FLEXURE; WITH BIOPSY, SINGLE OR MULTIPLE;  Surgeon: Modena Nunnery, MD;  [facility];  Service: Gastroenterology   ??? PR COLONOSCOPY W/BIOPSY SINGLE/MULTIPLE N/A 02/12/2019    Procedure: COLONOSCOPY, FLEXIBLE, PROXIMAL TO SPLENIC FLEXURE; WITH BIOPSY, SINGLE OR MULTIPLE;  Surgeon: Jules Husbands, MD;  Location: [facility];  Service: Gastroenterology   ??? PR COLSC FLEXIBLE W/TRANSENDOSCOPIC BALLOON DILAT N/A 07/15/2014    Procedure: COLONOSCOPY, FLEXIBLE; WITH DILATION BY BALLOON, 1 OR MORE STRICTURES;  Surgeon: Janyth Pupa, MD;  Location: [facility];  Service: Gastroenterology   ??? PR REMVL COLON & TERM ILEUM W/ILEOCOLOSTOMY N/A 09/16/2016    Procedure: COLECTOMY, PARTIAL, WITH REMOVAL OF TERMINAL ILEUM WITH ILEOCOLOSTOMY;  Surgeon: Lady Gary, MD;  Location: MAIN OR  [facility];  Service: Gastrointestinal   ??? PR REPAIR BICEPS LONG TENDON Right 03/19/2019    Procedure: TENODESIS LONG TENDON BICEPS;  Surgeon: Gonzella Lex, MD;  Location: ASC OR [facility];  Service: Orthopedics   ??? PR SHLDR ARTHROSCOP,PART ACROMIOPLAS Right 10/02/2018    Procedure: R16 ARTHROSCOPY, SHOULDER, SURGICAL; DECOMPRESS SUBACROMIAL SPACE W/PART ACROMIOPLASTY, Tamala Bari;  Surgeon: Gonzella Lex, MD;  Location: ASC OR [facility];  Service: Orthopedics   ??? PR SHLDR ARTHROSCOP,SURG,W/ROTAT CUFF REPR Right 10/02/2018    Procedure: ARTHROSCOPY, SHOULDER, SURGICAL; WITH ROTATOR CUFF REPAIR;  Surgeon: Gonzella Lex, MD;  Location: ASC OR [facility];  Service: Orthopedics   ??? PR SHLDR ARTHROSCOP,SURG,W/ROTAT CUFF REPR Right 03/19/2019    Procedure: R16 ARTHROSCOPY, SHOULDER, SURGICAL; WITH ROTATOR CUFF REPAIR;  Surgeon: Gonzella Lex, MD;  Location: ASC OR [facility];  Service: Orthopedics   ??? PR UPPER GI ENDOSCOPY,BIOPSY N/A 07/23/2012    Procedure: UGI ENDOSCOPY; WITH BIOPSY, SINGLE OR MULTIPLE;  Surgeon: Vickii Chafe, MD;  Location: [facility];  Service: Gastroenterology   ??? PR UPPER GI ENDOSCOPY,DIAGNOSIS N/A 11/10/2012    Procedure: UGI ENDO, INCLUDE ESOPHAGUS, STOMACH, & DUODENUM &/OR JEJUNUM; DX W/WO COLLECTION SPECIMN, BY BRUSH OR WASH;  Surgeon: Malcolm Metro, MD;  Location: [facility];  Service: Gastroenterology   ??? PR UPPER GI ENDOSCOPY,DIAGNOSIS N/A 07/21/2013    Procedure: UGI ENDO, INCLUDE ESOPHAGUS, STOMACH, & DUODENUM &/OR JEJUNUM; DX W/WO COLLECTION SPECIMN, BY BRUSH OR WASH;  Surgeon: Gwen Pounds, MD;  Location: [facility];  Service: Gastroenterology   ??? PR UPPER GI ENDOSCOPY,DIAGNOSIS N/A 07/15/2014    Procedure: UGI ENDO, INCLUDE ESOPHAGUS, STOMACH, & DUODENUM &/OR JEJUNUM; DX W/WO COLLECTION SPECIMN, BY BRUSH OR WASH;  Surgeon: Janyth Pupa, MD;  Location: [facility];  Service: Gastroenterology   ??? ROTATOR CUFF REPAIR Right 2020   ??? SPINE SURGERY       Current Outpatient Medications   Medication Sig Dispense Refill   ??? dronabinoL (MARINOL) 10 MG capsule TAKE 1 CAPSULE BY MOUTH TWICE DAILY 30 MINUTES BEFORE MEALS 60 capsule 0   ??? gabapentin (NEURONTIN) 300 MG capsule Take 1 capsule (300 mg total) by mouth Three (3) times a day. Takes as needed 270 capsule 1   ??? 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 oxyCODONE-acetaminophen (PERCOCET) 10-325 mg per tablet Take 1 tablet by mouth every eight (8) hours as needed.     ??? sildenafil (VIAGRA) 50 MG tablet Take 50 mg by mouth daily as needed for erectile dysfunction.     ??? therapeutic multivitamin (THERAGRAN) tablet Take 1 tablet by mouth daily.      ??? ustekinumab (STELARA) 90 mg/mL Syrg syringe Inject the contents of 1 syringe (90 mg total) under the skin every 8 weeks. 1 mL 5   ??? citalopram (CELEXA) 10 MG tablet Take 1 tablet (10 mg total) by mouth daily. 90 tablet 3   ??? loperamide (IMODIUM A-D) 2 mg tablet Take 1 tablet (2 mg total) by mouth two (2) times a day as needed (nausea when skipping oxycodone). 30 tablet 0   ??? scopolamine (TRANSDERM-SCOP) 1 mg over 3 days Place 1 patch (1.5 mg total) on the skin every third day. 10 patch 12     No current facility-administered medications for this visit.     Allergies  Reviewed on 08/11/2019      Reactions Comment  Tramadol Itching, Anxiety Other reaction(s): Other (See Comments)    Penicillins  Other reaction(s): Itching-Allergy    Dilaudid [hydromorphone] Anxiety     Morphine Itching         Family History   Problem Relation Age of Onset   ??? Hyperlipidemia Father    ??? Cancer Father         kidney   ??? Cancer Maternal Aunt    ??? Stroke Mother    ??? No Known Problems Sister    ??? No Known Problems Brother    ??? No Known Problems Maternal Uncle    ??? No Known Problems Paternal Aunt    ??? No Known Problems Paternal Uncle    ??? No Known Problems Maternal Grandmother    ??? No Known Problems Maternal Grandfather    ??? No Known Problems Paternal Grandmother    ??? No Known Problems Paternal Grandfather    ??? Anesthesia problems Neg Hx    ??? Broken bones Neg Hx    ??? Clotting disorder Neg Hx    ??? Collagen disease Neg Hx    ??? Diabetes Neg Hx    ??? Dislocations Neg Hx    ??? Fibromyalgia Neg Hx    ??? Gout Neg Hx    ??? Hemophilia Neg Hx    ??? Osteoporosis Neg Hx    ??? Rheumatologic disease Neg Hx    ??? Scoliosis Neg Hx    ??? Severe sprains Neg Hx    ??? Sickle cell anemia Neg Hx    ??? Spinal Compression Fracture Neg Hx      Social History     Tobacco Use   ??? Smoking status: Former Smoker     Packs/day: 1.00     Years: 18.00     Pack years: 18.00     Types: Cigarettes     Start date: 08/27/2003     Quit date: 06/06/2017     Years since quitting: 2.1   ??? Smokeless tobacco: Never Used   ??? Tobacco comment: Pt smokes 1ppd, Pt is interested in tobacco cessation    Vaping Use   ??? Vaping Use: Never used   Substance Use Topics   ??? Alcohol use: No   ??? Drug use: Not Currently     Types: Marijuana       OBJECTIVE:   VITAL SIGNS: BP 149/84  - Pulse 65  - Ht 180.3 cm (5' 11)  - Wt 66.7 kg (147 lb)  - BMI 20.50 kg/m??     Wt Readings from Last 6 Encounters:   08/11/19 66.7 kg (147 lb)   05/11/19 68 kg (150 lb)   03/19/19 63 kg (138 lb 14.2 oz)   03/11/19 67.1 kg (148 lb)   02/22/19 64 kg (141 lb 1.6 oz)   02/12/19 59.4 kg (131 lb)       PHYSICAL EXAM:  General appearance: Appears well, no distress. Very thin.  Eyes: Anicteric sclera. No erythema.  ENT: No oral ulcers. Posterior oropharynx unremarkable.  Cardiovascular: RRR without murmurs, heaves, or thrills. No lower extremity edema.  Pulmonary: Normal work of breathing. Acyanotic.  Abdominal: soft, mild epigastric tenderness at baseline, nondistended, no masses or organomegaly.  Musculoskeletal: No temporal wasting. Normal joints of the hand.  Skin: No jaundice. No rashes.  Neurologic: Alert, oriented, and appropriate.  Psychiatric: Appropriate.

## 2019-08-12 NOTE — Unmapped (Addendum)
1. Return in about 3 months (around 11/12/2019). Schedule at the check-out desk or call [phone] to schedule follow-up.  2. Reach out to [place] at my e-mail or by calling Robin at the numbers below if you have any questions or problems in the interim.  3. If you do not receive a call to schedule any tests or visits with doctors we refer you to, please call the scheduling numbers below or reach out to my e-mail or Robin for help scheduling.  4. Try loperamide 2 mg twice daily on days you skip the opioids.  5. Try the scopolamine patch on those days as well.  6. We will refill Celexa and gabapentin.  7. We will do routine screening for gonorrhea and chlamydia.    -------------------------------------------------------------------------------    If you have any questions or concerns please contact us on MyChart or as below:    For appointments, please call [phone].  For scheduling radiology appointments [phone].  For scheduling GI procedures (e.g,. Colonoscopy), please call [phone].  For emergencies after hours call [phone] and ask for the GI medicine fellow on call.    ==============================================

## 2019-08-13 DIAGNOSIS — Z113 Encounter for screening for infections with a predominantly sexual mode of transmission: Principal | ICD-10-CM

## 2019-08-13 NOTE — Unmapped (Signed)
I informed patient about gc/chlam testing issue, he will have collected at [facility].

## 2019-08-18 ENCOUNTER — Ambulatory Visit: Admit: 2019-08-18 | Payer: MEDICARE

## 2019-08-18 NOTE — Unmapped (Signed)
COMPLEX CASE MANAGEMENT   FOLLOW UP NOTE  Summary:  [facility] Care Coordinator spoke with patient and verified correct patient using two identifiers today for Complex Case Management follow up. Patient currently resides at Home. Primary concern is none at this time.      Subjective:    Patient/caregiver reported that he was able to follow up with the vision resources that Care Coordinator sent to him at our last outreach encounter. Patient states I am just waiting on my glasses to come in now. He states that he has not had a chance to follow up with the dental resources but plans to do so soon. Patient states that at this time he has no other questions or concerns at this time.    Objective:     Screenings Completed during visit: None completed at this visit    Barriers to care: None Identified    Interventions provided: Resource follow up     Progress towards Heathcare Access goal : On Track as he has already followed up with vision resource and will also follow up with dental resources as well.     Plan:     1. Case Management to: Follow up in 2 weeks    2. Patient/caregiver to: follow up with PCP/Complex Case Management if needs arise.     Discuss at next outreach: Advanced Care Planning    Care Coordination Note updated in [facility]: Yes    Future Appointments   Date Time Provider Department Center   08/18/2019  2:00 PM [facility] CT RM 1 [facility]   09/02/2019  9:45 AM Gonzella Lex, MD [facility]       Problem List     ??? Nausea    ??? Status post intestinal bypass or anastomosis (Chronic)    ??? Crohn's disease of both small and large intestine with other complication (Chronic)    ??? Rotator cuff syndrome of right shoulder    ??? ABDOMINAL PAIN    ??? Traumatic partial tear of right biceps tendon    ??? Hyponatremia    ??? High potassium levels          Allergies:   Allergies   Allergen Reactions   ??? Tramadol Itching and Anxiety     Other reaction(s): Other (See Comments)   ??? Penicillins      Other reaction(s): Itching-Allergy   ??? Dilaudid [Hydromorphone] Anxiety   ??? Morphine Itching       Medications:  Prior to Admission medications    Medication Dose, Route, Frequency   citalopram (CELEXA) 10 MG tablet 10 mg, Oral, Daily (standard)   dronabinoL (MARINOL) 10 MG capsule TAKE 1 CAPSULE BY MOUTH TWICE DAILY 30 MINUTES BEFORE MEALS   gabapentin (NEURONTIN) 300 MG capsule 300 mg, Oral, 3 times a day (standard), Takes as needed   loperamide (IMODIUM A-D) 2 mg tablet 2 mg, Oral, 2 times a day PRN   naloxone (NARCAN) 4 mg nasal spray For respiratory depression from opioids   oxyCODONE-acetaminophen (PERCOCET) 10-325 mg per tablet 1 tablet, Oral, Every 8 hours PRN   scopolamine (TRANSDERM-SCOP) 1 mg over 3 days 1 patch, Transdermal, Every 72 hours   sildenafil (VIAGRA) 50 MG tablet 50 mg, Oral, Daily PRN   therapeutic multivitamin (THERAGRAN) tablet 1 tablet, Oral, Daily (standard)   ustekinumab (STELARA) 90 mg/mL Syrg syringe Inject the contents of 1 syringe (90 mg total) under the skin every 8 weeks.   dronabinoL (MARINOL) 10 MG capsule TAKE  1 CAPSULE BY MOUTH TWICE DAILY BEFORE A MEAL          Steffanie Rainwater  08/18/2019

## 2019-08-19 NOTE — Unmapped (Signed)
I saw and evaluated the patient, participating in the key portions of the service.  I reviewed the resident’s note.  I agree with the resident’s findings and plan. Mercedes [place] B Jaxten Brosh, MD

## 2019-08-26 MED ORDER — GABAPENTIN 100 MG CAPSULE
0 days
Start: 2019-08-26 — End: ?

## 2019-08-27 ENCOUNTER — Ambulatory Visit: Admit: 2019-08-27 | Discharge: 2019-08-28 | Payer: MEDICARE

## 2019-08-30 NOTE — Unmapped (Signed)
Complex Case Management   Final Call- Program Completion  Summary:   Case Manager spoke with patient today to resolve Complex Case Management services. Patient currently resides at Home.    Patient  reported to have no concerns with his Crohn's management at this time. Patient has yet to schedule dental care but has resources to contact. Patient states he has ACP documents but has not completed them. Patient denies any questions regarding forms at this time.       Plan:   Provided PCP office number and after-hours nurse line information for future care needs     Care Coordination Note updated in [facility]: Yes    Pt Care Coordination Note:  This patient completed Complex Case Management services on 08/30/2019.     The following barriers were addressed: Cost of Care and Health Literacy    The following interventions were provided:  Contact Information Provided, Introduction to ICM, Medication Reconciliation, Advanced Care Planning Education and Supportive Listening,Crohn's diet Education , High Calorie/Protein diet education, Goal setting education, RD referral, Care Coordinator mailed local resource location information to patient. Crohn's education, Care Coordinator followed up on vision and dental resources with patient.    If this patient develops new chronic conditions, new opportunities to resolve barriers, or other areas of need, please send an AMB Referral to Case Management to the Personal Health [facility] Department.    Reynaldo Minium RN, CCM - Intensive Case Manager  [facility] Clinical Services  [address] [place], [place] [address]  p. [phone]  [email or web address]

## 2019-09-02 ENCOUNTER — Ambulatory Visit: Admit: 2019-09-02 | Discharge: 2019-09-03 | Payer: MEDICARE

## 2019-09-02 NOTE — Unmapped (Signed)
Date of surgery: 03/19/2019.    Procedure performed: Revision right shoulder rotator cuff repair.     Prior procedure was 10/02/2018.    HPI: Patient s/p intra-articular steroid injection 07/08/19 for persistent pain and decreased forward flexion. Today he reports full resolution of pain and normal ROM.  Denies any trauma or injury.    Physical examination: Active forward flexion to 180, left side active forward flexion to 1800,    Internal rotation right to approximately T8.  Internal rotation left to T8.    Strength: With the scapula stabilized and the elbow extended he has 5/5 strength in the supraspinatus, improved from last appointment.     Assessment: Has return of full function and no pain.     Plan: Patient reassured by progress with physical therapy and following CSI. He has no complaints or concerns. Encouraged patient to continue with home exercises prescribed by PT.  All his questions were answered.     Follow-up: as needed    Ventura Sellers, DO  Primary Care Sports Medicine Fellow      Attending: Dr. Ivin Poot, MD

## 2019-09-02 NOTE — Unmapped (Signed)
I saw and evaluated the patient, participating in the key portions of the service.  I reviewed the resident???s note.  I agree with the resident???s findings and plan. Cruz Condon, MD

## 2019-09-14 ENCOUNTER — Ambulatory Visit: Payer: Medicare Other | Attending: Anesthesiology | Admitting: Anesthesiology

## 2019-09-14 ENCOUNTER — Other Ambulatory Visit: Payer: Self-pay

## 2019-09-14 ENCOUNTER — Encounter: Payer: Self-pay | Admitting: Anesthesiology

## 2019-09-14 DIAGNOSIS — M549 Dorsalgia, unspecified: Secondary | ICD-10-CM

## 2019-09-14 DIAGNOSIS — K5090 Crohn's disease, unspecified, without complications: Secondary | ICD-10-CM | POA: Diagnosis not present

## 2019-09-14 DIAGNOSIS — G894 Chronic pain syndrome: Secondary | ICD-10-CM | POA: Diagnosis not present

## 2019-09-14 DIAGNOSIS — F1190 Opioid use, unspecified, uncomplicated: Secondary | ICD-10-CM

## 2019-09-14 DIAGNOSIS — R109 Unspecified abdominal pain: Secondary | ICD-10-CM | POA: Diagnosis not present

## 2019-09-14 DIAGNOSIS — M47816 Spondylosis without myelopathy or radiculopathy, lumbar region: Secondary | ICD-10-CM

## 2019-09-14 DIAGNOSIS — M545 Low back pain, unspecified: Secondary | ICD-10-CM

## 2019-09-14 DIAGNOSIS — G8929 Other chronic pain: Secondary | ICD-10-CM

## 2019-09-14 DIAGNOSIS — M542 Cervicalgia: Secondary | ICD-10-CM

## 2019-09-14 MED ORDER — OXYCODONE-ACETAMINOPHEN 10-325 MG PO TABS: 1.0000 | ORAL_TABLET | Freq: Three times a day (TID) | ORAL | 0 refills | Status: AC | PRN

## 2019-09-14 MED ORDER — OXYCODONE-ACETAMINOPHEN 10-325 MG PO TABS: 1.0000 | ORAL_TABLET | Freq: Three times a day (TID) | ORAL | 0 refills | Status: DC | PRN

## 2019-09-15 NOTE — Progress Notes (Signed)
Virtual Visit via Telephone Note  I connected with Benjamin Murillo on 09/15/19 at  1:15 PM EDT by telephone and verified that I am speaking with the correct person using two identifiers.  Location: Patient: Home Provider: Pain control center   I discussed the limitations, risks, security and privacy concerns of performing an evaluation and management service by telephone and the availability of in person appointments. I also discussed with the patient that there may be a patient responsible charge related to this service. The patient expressed understanding and agreed to proceed.   History of Present Illness: I spoke with Benjamin Murillo via telephone today.  We were unable to link up for the video portion of the virtual conference but he reports that he is doing reasonably well with his abdominal pain.  No changes are noted in the quality characteristic or distribution of this.  He still take his medications as prescribed.  He continues take his Marinol as prescribed as well.  He is tolerating the opioid regimen well with no side effects reported.  Based on his discussion today and upon review he continues to derive good functional benefit from chronic opioid maintenance therapy.  He continues to follow-up with his GI doctors periodically as requested.  His low back pain and neck pain have been stable in nature.  No changes in upper or lower extremity strength or mention today.  Otherwise he is in his usual state of health.    Observations/Objective:  Current Outpatient Medications:  .  amlodipine-atorvastatin (CADUET) 10-10 MG tablet, Take 1 tablet by mouth daily. (Patient not taking: Reported on 07/15/2019), Disp: , Rfl:  .  dronabinol (MARINOL) 10 MG capsule, Take 10 mg by mouth 2 (two) times daily before a meal., Disp: , Rfl:  .  famotidine (PEPCID) 20 MG tablet, Take 20 mg by mouth 2 (two) times daily., Disp: , Rfl:  .  gabapentin (NEURONTIN) 100 MG capsule, Take 1 capsule (100 mg total) by  mouth at bedtime., Disp: 30 capsule, Rfl: 3 .  hyoscyamine (LEVSIN, ANASPAZ) 0.125 MG tablet, Take 0.125 mg by mouth as needed. , Disp: , Rfl:  .  naloxone (NARCAN) nasal spray 4 mg/0.1 mL, For respiratory depression from opioids, Disp: 1 kit, Rfl: 2 .  [START ON 09/29/2019] oxyCODONE-acetaminophen (PERCOCET) 10-325 MG tablet, Take 1 tablet by mouth every 8 (eight) hours as needed for pain., Disp: 75 tablet, Rfl: 0 .  [START ON 10/29/2019] oxyCODONE-acetaminophen (PERCOCET) 10-325 MG tablet, Take 1 tablet by mouth every 8 (eight) hours as needed for pain., Disp: 75 tablet, Rfl: 0 .  prochlorperazine (COMPAZINE) 5 MG tablet, Take 5 mg by mouth every 8 (eight) hours as needed., Disp: , Rfl:  .  STELARA 90 MG/ML SOSY injection, Inject 90 mg as directed., Disp: , Rfl:   Assessment and Plan: 1. Chronic abdominal pain   2. Chronic pain syndrome   3. Chronic, continuous use of opioids   4. Crohn's disease without complication, unspecified gastrointestinal tract location ([place])   5. Cervicalgia   6. Chronic neck and back pain   7. Facet arthritis of lumbar region   8. Chronic bilateral low back pain without sciatica   Based on our discussion today and upon review of the [facility] practitioner database information when to refill his medications for September 1 October 1.  I will have him continue on his current regimen.  No other changes are made today.  I have encouraged him to continue follow-up with his primary care  physicians for his baseline medical care.  Continue doing physical therapy exercises for his neck and low back pain as reviewed today.  Schedule follow-up will be in 2 months.  Follow Up Instructions:    I discussed the assessment and treatment plan with the patient. The patient was provided an opportunity to ask questions and all were answered. The patient agreed with the plan and demonstrated an understanding of the instructions.   The patient was advised to call back or seek an  in-person evaluation if the symptoms worsen or if the condition fails to improve as anticipated.  I provided 30 minutes of non-face-to-face time during this encounter.   [name], MD

## 2019-09-16 NOTE — Unmapped (Signed)
[facility] Refill Coordination Note    Specialty Medication(s) to be Shipped:   Inflammatory Disorders: Stelara    Other medication(s) to be shipped: No additional medications requested for fill at this time     Rodney Bender, DOB: 1970/02/15  Phone: [phone] (home)       All above HIPAA information was verified with patient.     Was a Nurse, learning disability used for this call? No    Completed refill call assessment today to schedule patient's medication shipment from the [facility] [phone]).       Specialty medication(s) and dose(s) confirmed: Regimen is correct and unchanged.   Changes to medications: Srinivas reports no changes at this time.  Changes to insurance: No  Questions for the pharmacist: No    Confirmed patient received Welcome Packet with first shipment. The patient will receive a drug information handout for each medication shipped and additional FDA Medication Guides as required.       DISEASE/MEDICATION-SPECIFIC INFORMATION        For patients on injectable medications: Patient currently has 0 doses left.  Next injection is scheduled for 10/02/19.    SPECIALTY MEDICATION ADHERENCE     Medication Adherence    Patient reported X missed doses in the last month: 0  Specialty Medication: Stelara 90mg /ml  Patient is on additional specialty medications: No  Informant: patient                    SHIPPING     Shipping address confirmed in Epic.     Delivery Scheduled: Yes, Expected medication delivery date: 09/23/19.     Medication will be delivered via Same Day Courier to the prescription address in Epic WAM.    Jasper Loser   [facility] Specialty Technician

## 2019-09-23 ENCOUNTER — Other Ambulatory Visit: Payer: Self-pay | Admitting: Anesthesiology

## 2019-09-23 MED FILL — STELARA 90 MG/ML SUBCUTANEOUS SYRINGE: SUBCUTANEOUS | 56 days supply | Qty: 1 | Fill #1

## 2019-09-23 MED FILL — STELARA 90 MG/ML SUBCUTANEOUS SYRINGE: 56 days supply | Qty: 1 | Fill #1 | Status: AC

## 2019-09-24 MED ORDER — DRONABINOL 10 MG CAPSULE
ORAL_CAPSULE | 0 refills | 0 days
Start: 2019-09-24 — End: ?

## 2019-09-27 MED ORDER — DRONABINOL 10 MG CAPSULE
ORAL_CAPSULE | 0 refills | 0 days | Status: CP
Start: 2019-09-27 — End: ?

## 2019-10-22 NOTE — Unmapped (Signed)
[place] Assessment of Medications Program (CAMP)                        RECRUITMENT SUMMARY NOTE     Patient was identified for CAMP Services.  A letter has been sent explaining program services.

## 2019-10-31 MED ORDER — DRONABINOL 10 MG CAPSULE
ORAL_CAPSULE | 0 refills | 0 days
Start: 2019-10-31 — End: ?

## 2019-11-01 MED ORDER — DRONABINOL 10 MG CAPSULE
ORAL_CAPSULE | 0 refills | 0 days | Status: CP
Start: 2019-11-01 — End: ?

## 2019-11-02 NOTE — Unmapped (Signed)
[place] Assessment of Medications Program (CAMP)                   RECRUITMENT SUMMARY NOTE     Patient was outreached for CAMP Services. Patient declined.      Jerolyn Center  Clinical Operations Specialist/CPhT  [employer] of [facility]ication [facility] (CAMP)  (P) [phone]

## 2019-11-11 NOTE — Unmapped (Signed)
[facility] Refill Coordination Note    Specialty Medication(s) to be Shipped:   Inflammatory Disorders: Stelara    Other medication(s) to be shipped: No additional medications requested for fill at this time     Rodney Bender, DOB: 04-Dec-1970  Phone: [phone] (home)       All above HIPAA information was verified with patient.     Was a Nurse, learning disability used for this call? No    Completed refill call assessment today to schedule patient's medication shipment from the [facility] [phone]).       Specialty medication(s) and dose(s) confirmed: Regimen is correct and unchanged.   Changes to medications: Rodney Bender reports no changes at this time.  Changes to insurance: No  Questions for the pharmacist: No    Confirmed patient received Welcome Packet with first shipment. The patient will receive a drug information handout for each medication shipped and additional FDA Medication Guides as required.       DISEASE/MEDICATION-SPECIFIC INFORMATION        For patients on injectable medications: Patient currently has 0 doses left.  Next injection is scheduled for 09/23.    SPECIALTY MEDICATION ADHERENCE     Medication Adherence    Patient reported X missed doses in the last month: 0  Specialty Medication: stelara 90mg /ml  Patient is on additional specialty medications: No  Patient is on more than two specialty medications: No  Any gaps in refill history greater than 2 weeks in the last 3 months: no  Demonstrates understanding of importance of adherence: yes  Informant: patient  Reliability of informant: reliable  Provider-estimated medication adherence level: good  Patient is at risk for Non-Adherence: No                stelara 90 mg/ml: 0 days of medicine on hand          SHIPPING     Shipping address confirmed in Epic.     Delivery Scheduled: Yes, Expected medication delivery date: 10/19.     Medication will be delivered via Same Day Courier to the prescription address in Epic WAM.    Antonietta Barcelona   [facility] Specialty Technician

## 2019-11-16 MED FILL — STELARA 90 MG/ML SUBCUTANEOUS SYRINGE: SUBCUTANEOUS | 56 days supply | Qty: 1 | Fill #2

## 2019-11-16 MED FILL — STELARA 90 MG/ML SUBCUTANEOUS SYRINGE: 56 days supply | Qty: 1 | Fill #2 | Status: AC

## 2019-11-23 ENCOUNTER — Telehealth: Payer: Self-pay

## 2019-11-23 MED ORDER — OXYCODONE-ACETAMINOPHEN 10-325 MG PO TABS: 1.0000 | ORAL_TABLET | Freq: Three times a day (TID) | ORAL | 0 refills | Status: DC | PRN

## 2019-11-23 NOTE — Addendum Note (Signed)
Addended by: [name] on: 11/23/2019 02:37 PM   Modules accepted: Orders

## 2019-11-23 NOTE — Telephone Encounter (Signed)
He runs out Oct. 30 and Dr. Andree Elk isnt here anymore this week. What can we do.

## 2019-11-23 NOTE — Telephone Encounter (Signed)
Please call patient and inform that Dr. Andree Elk has sent a 1 month supply in to his pharmacy. He MUST make an appointment before this supply runs out. NO MORE PHONE REFILLS WITHOUT AN APPOINTMENT. Thank you- Anderson Malta

## 2019-12-09 MED ORDER — DRONABINOL 10 MG CAPSULE
ORAL_CAPSULE | 0 refills | 0 days | Status: CP
Start: 2019-12-09 — End: 2020-01-10

## 2019-12-21 ENCOUNTER — Other Ambulatory Visit: Payer: Self-pay

## 2019-12-21 ENCOUNTER — Ambulatory Visit (HOSPITAL_BASED_OUTPATIENT_CLINIC_OR_DEPARTMENT_OTHER): Payer: No Typology Code available for payment source | Admitting: Anesthesiology

## 2019-12-21 ENCOUNTER — Ambulatory Visit: Payer: Medicare Other | Attending: Anesthesiology | Admitting: Anesthesiology

## 2019-12-21 ENCOUNTER — Encounter: Payer: Self-pay | Admitting: Anesthesiology

## 2019-12-21 VITALS — BP 128/85 | HR 69 | Temp 98.1°F | Resp 16 | Ht 71.0 in | Wt 159.0 lb

## 2019-12-21 DIAGNOSIS — G8929 Other chronic pain: Secondary | ICD-10-CM

## 2019-12-21 DIAGNOSIS — F1190 Opioid use, unspecified, uncomplicated: Secondary | ICD-10-CM | POA: Diagnosis not present

## 2019-12-21 DIAGNOSIS — M549 Dorsalgia, unspecified: Secondary | ICD-10-CM

## 2019-12-21 DIAGNOSIS — K5090 Crohn's disease, unspecified, without complications: Secondary | ICD-10-CM | POA: Diagnosis not present

## 2019-12-21 DIAGNOSIS — M5412 Radiculopathy, cervical region: Secondary | ICD-10-CM

## 2019-12-21 DIAGNOSIS — R109 Unspecified abdominal pain: Secondary | ICD-10-CM

## 2019-12-21 DIAGNOSIS — M545 Low back pain, unspecified: Secondary | ICD-10-CM

## 2019-12-21 DIAGNOSIS — M542 Cervicalgia: Secondary | ICD-10-CM

## 2019-12-21 DIAGNOSIS — M47816 Spondylosis without myelopathy or radiculopathy, lumbar region: Secondary | ICD-10-CM

## 2019-12-21 DIAGNOSIS — G894 Chronic pain syndrome: Secondary | ICD-10-CM

## 2019-12-21 MED ORDER — OXYCODONE-ACETAMINOPHEN 10-325 MG PO TABS: 1.0000 | ORAL_TABLET | Freq: Three times a day (TID) | ORAL | 0 refills | Status: AC | PRN

## 2019-12-21 MED ORDER — OXYCODONE-ACETAMINOPHEN 10-325 MG PO TABS
1.0000 | ORAL_TABLET | Freq: Three times a day (TID) | ORAL | 0 refills | Status: DC | PRN
Start: 2020-01-23 — End: 2020-02-21

## 2019-12-21 NOTE — Progress Notes (Signed)
Nursing Pain Medication Assessment:  Safety precautions to be maintained throughout the outpatient stay will include: orient to surroundings, keep bed in low position, maintain call bell within reach at all times, provide assistance with transfer out of bed and ambulation.  Medication Inspection Compliance: Pill count conducted under aseptic conditions, in front of the patient. Neither the pills nor the bottle was removed from the patient's sight at any time. Once count was completed pills were immediately returned to the patient in their original bottle.  Medication: Oxycodone/APAP Pill/Patch Count: 10 of 75 pills remain Pill/Patch Appearance: Markings consistent with prescribed medication Bottle Appearance: Standard pharmacy container. Clearly labeled. Filled Date: 42 / 26 / 2021 Last Medication intake:  Today

## 2019-12-22 ENCOUNTER — Encounter: Payer: Self-pay | Admitting: Anesthesiology

## 2019-12-22 ENCOUNTER — Telehealth: Payer: Medicare Other | Admitting: Anesthesiology

## 2019-12-22 DIAGNOSIS — M5412 Radiculopathy, cervical region: Secondary | ICD-10-CM

## 2019-12-22 HISTORY — DX: Radiculopathy, cervical region: M54.12

## 2019-12-22 NOTE — Progress Notes (Signed)
Virtual Visit via Telephone Note  I connected with Benjamin Murillo on 12/22/19 at  1:20 PM EST by telephone and verified that I am speaking with the correct person using two identifiers.  Location: Patient: Home and clinic Provider: Pain control center   I discussed the limitations, risks, security and privacy concerns of performing an evaluation and management service by telephone and the availability of in person appointments. I also discussed with the patient that there may be a patient responsible charge related to this service. The patient expressed understanding and agreed to proceed.   History of Present Illness: Benjamin Murillo presented to the pain control center today for evaluation.  Secondary to some schedule complications I was unable to see him upon presentation and ultimately he had to leave.  I was able to catch up with him via telephone for the encounter.  He states that he is doing reasonably well with his current medication management regimen.  He is taking his medications as prescribed approximately 2-3 times per day to keep his abdominal pain neck pain and low back pain under control and this continues to work well for him.  No side effects reported.  Unfortunately has failed more conservative therapy but this is working well and he is status quo he reports.  Otherwise no changes noted in the quality characteristic or distribution of his abdominal pain or neck pain.  He has had a slight flareup with some upper extremity referred pain and this has been discussed with his orthopedic surgeon.   Observations/Objective:  Current Outpatient Medications:  .  amlodipine-atorvastatin (CADUET) 10-10 MG tablet, Take 1 tablet by mouth daily. (Patient not taking: Reported on 12/21/2019), Disp: , Rfl:  .  citalopram (CELEXA) 10 MG tablet, Take 10 mg by mouth daily as needed., Disp: , Rfl:  .  dronabinol (MARINOL) 10 MG capsule, Take 10 mg by mouth 2 (two) times daily before a meal., Disp: , Rfl:  .   famotidine (PEPCID) 20 MG tablet, Take 20 mg by mouth 2 (two) times daily., Disp: , Rfl:  .  gabapentin (NEURONTIN) 100 MG capsule, Take 1 capsule (100 mg total) by mouth at bedtime., Disp: 30 capsule, Rfl: 3 .  gabapentin (NEURONTIN) 300 MG capsule, Take 300 mg by mouth daily., Disp: , Rfl:  .  hyoscyamine (LEVSIN, ANASPAZ) 0.125 MG tablet, Take 0.125 mg by mouth as needed. , Disp: , Rfl:  .  loperamide (IMODIUM A-D) 2 MG tablet, Take 2 mg by mouth as needed., Disp: , Rfl:  .  naloxone (NARCAN) nasal spray 4 mg/0.1 mL, For respiratory depression from opioids, Disp: 1 kit, Rfl: 2 .  [START ON 12/24/2019] oxyCODONE-acetaminophen (PERCOCET) 10-325 MG tablet, Take 1 tablet by mouth every 8 (eight) hours as needed for pain., Disp: 75 tablet, Rfl: 0 .  [START ON 01/23/2020] oxyCODONE-acetaminophen (PERCOCET) 10-325 MG tablet, Take 1 tablet by mouth every 8 (eight) hours as needed for pain., Disp: 75 tablet, Rfl: 0 .  prochlorperazine (COMPAZINE) 5 MG tablet, Take 5 mg by mouth every 8 (eight) hours as needed., Disp: , Rfl:  .  STELARA 90 MG/ML SOSY injection, Inject 90 mg as directed., Disp: , Rfl:   Assessment and Plan: 1. Chronic abdominal pain   2. Chronic pain syndrome   3. Chronic, continuous use of opioids   4. Crohn's disease without complication, unspecified gastrointestinal tract location ([place])   5. Cervicalgia   6. Chronic neck and back pain   7. Facet arthritis of lumbar region  8. Chronic bilateral low back pain without sciatica   9. Cervical radiculitis   I have reviewed the [facility] practitioner database information and is appropriate for refill.  Refills will be given for November 26 and December 26 with follow-up scheduled for 2 months.  No other changes will be made in his pain management protocol at this stage.  He stable with this regimen and continue to get good relief he reports no side effects reported he reports.  Want him to continue follow-up with his orthopedic  physician for the cervical radicular symptoms he is described.  We did talk about some options regarding using an orthotic pillow and avoidance of certain activities and continued physical therapy.  Follow Up Instructions:    I discussed the assessment and treatment plan with the patient. The patient was provided an opportunity to ask questions and all were answered. The patient agreed with the plan and demonstrated an understanding of the instructions.   The patient was advised to call back or seek an in-person evaluation if the symptoms worsen or if the condition fails to improve as anticipated.  I provided 30 minutes of non-face-to-face time during this encounter.   [name], MD

## 2019-12-30 NOTE — Unmapped (Signed)
[facility] Clinical Assessment & Refill Coordination Note    Errol Ala [place], [place]: 08-07-1970  Phone: [phone] (home)     All above HIPAA information was verified with patient.     Was a Nurse, learning disability used for this call? No    Specialty Medication(s):   Inflammatory Disorders: Stelara     Current Outpatient Medications   Medication Sig Dispense Refill   ??? citalopram (CELEXA) 10 MG tablet Take 1 tablet (10 mg total) by mouth daily. 90 tablet 3   ??? dronabinoL (MARINOL) 10 MG capsule TAKE 1 CAPSULE BY MOUTH TWICE DAILY 30  MINUTES  BEFORE  A  MEAL 60 capsule 0   ??? gabapentin (NEURONTIN) 100 MG capsule      ??? gabapentin (NEURONTIN) 300 MG capsule Take 1 capsule (300 mg total) by mouth Three (3) times a day. Takes as needed 270 capsule 1   ??? loperamide (IMODIUM A-D) 2 mg tablet Take 1 tablet (2 mg total) by mouth two (2) times a day as needed (nausea when skipping oxycodone). 30 tablet 0   ??? 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 oxyCODONE-acetaminophen (PERCOCET) 10-325 mg per tablet Take 1 tablet by mouth every eight (8) hours as needed.     ??? scopolamine (TRANSDERM-SCOP) 1 mg over 3 days Place 1 patch (1.5 mg total) on the skin every third day. 10 patch 12   ??? sildenafil (VIAGRA) 50 MG tablet Take 50 mg by mouth daily as needed for erectile dysfunction.     ??? therapeutic multivitamin (THERAGRAN) tablet Take 1 tablet by mouth daily.      ??? ustekinumab (STELARA) 90 mg/mL Syrg syringe Inject the contents of 1 syringe (90 mg total) under the skin every 8 weeks. 1 mL 5     No current facility-administered medications for this visit.        Changes to medications: Gleen reports no changes at this time.    Allergies   Allergen Reactions   ??? Tramadol Itching and Anxiety     Other reaction(s): Other (See Comments)   ??? Penicillins      Other reaction(s): Itching-Allergy   ??? Dilaudid [Hydromorphone] Anxiety   ??? Morphine Itching       Changes to allergies: No    SPECIALTY MEDICATION ADHERENCE     Stelara 90 mg/ml: 0 days of medicine on hand     Medication Adherence    Patient reported X missed doses in the last month: 0  Specialty Medication: Stelara  Patient is on additional specialty medications: No  Informant: patient          Specialty medication(s) dose(s) confirmed: Regimen is correct and unchanged.     Are there any concerns with adherence? No    Adherence counseling provided? Not needed    CLINICAL MANAGEMENT AND INTERVENTION      Clinical Benefit Assessment:    Do you feel the medicine is effective or helping your condition? Yes    Clinical Benefit counseling provided? Not needed    Adverse Effects Assessment:    Are you experiencing any side effects? No    Are you experiencing difficulty administering your medicine? No    Quality of Life Assessment:    How many days over the past month did your Crohn;s  keep you from your normal activities? For example, brushing your teeth or getting up in the morning. 0    Have you discussed  this with your provider? Not needed    Therapy Appropriateness:    Is therapy appropriate? Yes, therapy is appropriate and should be continued    DISEASE/MEDICATION-SPECIFIC INFORMATION      For patients on injectable medications: Patient currently has 0 doses left.  Next injection is scheduled for 01/08/20.    PATIENT SPECIFIC NEEDS     - Does the patient have any physical, cognitive, or cultural barriers? No    - Is the patient high risk? No    - Does the patient require a Care Management Plan? No     - Does the patient require physician intervention or other additional services (i.e. nutrition, smoking cessation, social work)? No      SHIPPING     Specialty Medication(s) to be Shipped:   Inflammatory Disorders: Stelara    Other medication(s) to be shipped: No additional medications requested for fill at this time     Changes to insurance: No    Delivery Scheduled: Yes, Expected medication delivery date: 01/05/20. Medication will be delivered via Same Day Courier to the confirmed prescription address in [facility].    The patient will receive a drug information handout for each medication shipped and additional FDA Medication Guides as required.  Verified that patient has previously received a Conservation officer, historic buildings.    All of the patient's questions and concerns have been addressed.    Arnold Long   [facility] Specialty Pharmacist

## 2020-01-05 MED FILL — STELARA 90 MG/ML SUBCUTANEOUS SYRINGE: SUBCUTANEOUS | 56 days supply | Qty: 1 | Fill #3

## 2020-01-05 MED FILL — STELARA 90 MG/ML SUBCUTANEOUS SYRINGE: 56 days supply | Qty: 1 | Fill #3 | Status: AC

## 2020-01-10 MED ORDER — DRONABINOL 10 MG CAPSULE
ORAL_CAPSULE | 0 refills | 0 days | Status: CP
Start: 2020-01-10 — End: 2020-02-15

## 2020-01-17 ENCOUNTER — Ambulatory Visit: Admit: 2020-01-17 | Discharge: 2020-01-18 | Payer: MEDICARE

## 2020-01-17 DIAGNOSIS — K5090 Crohn's disease, unspecified, without complications: Principal | ICD-10-CM

## 2020-01-17 DIAGNOSIS — M25511 Pain in right shoulder: Principal | ICD-10-CM

## 2020-01-17 DIAGNOSIS — Z981 Arthrodesis status: Principal | ICD-10-CM

## 2020-01-17 DIAGNOSIS — F419 Anxiety disorder, unspecified: Principal | ICD-10-CM

## 2020-01-17 DIAGNOSIS — Z885 Allergy status to narcotic agent status: Principal | ICD-10-CM

## 2020-01-17 DIAGNOSIS — Z880 Allergy status to penicillin: Principal | ICD-10-CM

## 2020-01-17 DIAGNOSIS — I10 Essential (primary) hypertension: Principal | ICD-10-CM

## 2020-01-17 DIAGNOSIS — M542 Cervicalgia: Principal | ICD-10-CM

## 2020-01-17 DIAGNOSIS — M5412 Radiculopathy, cervical region: Principal | ICD-10-CM

## 2020-01-17 MED ORDER — CELECOXIB 200 MG CAPSULE
ORAL_CAPSULE | Freq: Every day | ORAL | 0 refills | 60.00000 days | Status: CP
Start: 2020-01-17 — End: 2020-02-07

## 2020-01-17 MED ORDER — CELECOXIB 200 MG CAPSULE: 200 mg | capsule | Freq: Every day | 0 refills | 60 days | Status: AC

## 2020-01-17 MED ADMIN — triamcinolone acetonide (KENALOG-40) injection 40 mg: 40 mg | INTRA_ARTICULAR | @ 16:00:00 | Stop: 2020-01-17

## 2020-01-17 NOTE — Unmapped (Signed)
ORTHOPAEDIC NOTE     Rodney Weisgerber L. Kenyatta Keidel, Rodney Bender        Rodney Bender    MRN: [id number]  DOB: 07-May-1970    Date of visit: 01/17/2020    Clinic location: [place]     ASSESSMENT:     Right cervical radiculopathy with shoulder pain     PLAN:     I discussed the patient I think most of his problem is coming from his neck but he feels it is from his shoulder and his shoulder is painful and popping and he would like to try corticosteroid injection in his shoulder  Prescription for Celebrex was provided  Procedure Note: Right shoulder subacromial space injection    Consent   After discussing the various treatment options for the condition, It was agreed that a corticosteroid injection would be the next step.  The risks of injection including infection, bleeding, allergic reaction, increased pain, incomplete relief of symptoms, alterations of blood glucose levels requiring monitoring and treatment as neded, tendon, ligament or articular cartilage rupture or degeneration, nerve injury, skin depigmentation, and/or fatty atrophy were discussed.   Procedure   After the risks and benefits of the procedure were explained,verbal consent was given, and a procedural time-out was performed.  The injection site marked and prepped with Chlorhexadine solution and anesthetized with ethyl chloride.  Right shoulder subacromial space was injected with 40 mg of Kenalog, and 4 ml of 0.5% Ropivicaine using sterile technique through the posterior lateral approach. During the injection, there was unrestricted flow and care was taken not to inject corticosteroid into the subcutaneous tissues.  Sterile band-aide applied. The patient tolerated the injection well and was discharged without complication.  Patient understands intra-articular injections can be repeated every 3 to 4 months as needed and tendon/bursa injections should only be performed 3 times no sooner than 3 months apart    -Advised OTC analgesic PRN pain  -Discussed treatment options and patient was amenable to the above plan and was instructed to call and be seen if there is any increasing pain or concerns.     Follow up: Dr. Ivin Poot per his request       Chief Complaint:     Right shoulder pain     SUBJECTIVE:     HPI: Rodney Bender is a RHD 49 y.o. with a PMHx Crohn's under the care of pain management taking Percocet and gabapentin daily, tobacco abuse, anxiety status post revision right rotator cuff repair on 03/19/2019 by Dr. Ivin Poot status post C6-7 ACDF presenting to [facility] complaining of right shoulder pain present 1 month.  He does not recall an injury and states that it was of slow onset.  Pain sometimes improved with putting his hand in his pocket and raising his arm above his head.  He feels that his shoulder is popping in and out on him.       Allergies  Allergies   Allergen Reactions   ??? Tramadol Itching and Anxiety     Other reaction(s): Other (See Comments)   ??? Penicillins      Other reaction(s): Itching-Allergy   ??? Dilaudid [Hydromorphone] Anxiety   ??? Morphine Itching          Past Medical History  Past Medical History:   Diagnosis Date   ??? Anxiety    ??? Crohn's disease (CMS-HCC)     diagnosed in 1990   ??? GERD (gastroesophageal reflux disease)    ??? Hypertension 10/08/2016  PHYSICAL EXAM:     MSK: Right shoulder and cervical spine  Inspection: No edema, no erythema, skin intact  Palpation: Globally tender throughout the shoulder as well as base of cervical spine  ROM: Full range of motion of the shoulder mild stiffness with range of motion of his neck pain with all planes of range of motion of the neck and shoulder  Positive Hawkins, positive Spurling's, negative drop arm  Strength: Full strength each plane range of motion right upper extremity  normal sensation RUE  radial pulses easily palpable     Imaging   5 views right shoulder obtained in clinic today independently interpreted by myself show postoperative changes along the proximal humerus with mild glenohumeral and acromioclavicular degenerative changes partially imaged ACDF  2 view cervical spine obtained in clinic today independently viewed interpreted by myself show C6-7 ACDF with C5-6 degenerative changes, no acute fractures, lucencies      MEDICAL DECISION MAKING (level of service defined by 2/3 elements)     Number/Complexity of Problems Addressed 1 acute, uncomplicated illness or injury (99203/99213)   Amount/Complexity of Data to be Reviewed/Analyzed Independent interpretation of a test performed by another physician/other qualified health care professional (99204/99214)   Risk of Complications/Morbidity/Mortality of Management Injectable Medication (99204/99214)       cc:  Suanne Marker, MD  *Patient note was created using Dragon Dictation sotware. Errors in syntax or grammar may not have been identified and edited on initial review.

## 2020-01-17 NOTE — Unmapped (Signed)
Addended by: Effie Shy A on: 01/17/2020 11:32 AM     Modules accepted: Orders

## 2020-01-18 ENCOUNTER — Ambulatory Visit: Admit: 2020-01-18 | Discharge status: Against Medical Advice | Payer: MEDICARE

## 2020-01-19 ENCOUNTER — Ambulatory Visit: Admit: 2020-01-19 | Discharge: 2020-01-19 | Disposition: A | Discharge status: Home / Self Care | Payer: MEDICARE

## 2020-01-19 ENCOUNTER — Emergency Department: Admit: 2020-01-19 | Discharge: 2020-01-19 | Disposition: A | Discharge status: Home / Self Care | Payer: MEDICARE

## 2020-01-19 DIAGNOSIS — M7989 Other specified soft tissue disorders: Principal | ICD-10-CM

## 2020-01-19 DIAGNOSIS — K219 Gastro-esophageal reflux disease without esophagitis: Principal | ICD-10-CM

## 2020-01-19 DIAGNOSIS — Z791 Long term (current) use of non-steroidal anti-inflammatories (NSAID): Principal | ICD-10-CM

## 2020-01-19 DIAGNOSIS — F419 Anxiety disorder, unspecified: Principal | ICD-10-CM

## 2020-01-19 DIAGNOSIS — I10 Essential (primary) hypertension: Principal | ICD-10-CM

## 2020-01-19 DIAGNOSIS — M25511 Pain in right shoulder: Principal | ICD-10-CM

## 2020-01-19 DIAGNOSIS — Z79891 Long term (current) use of opiate analgesic: Principal | ICD-10-CM

## 2020-01-19 DIAGNOSIS — Z885 Allergy status to narcotic agent status: Principal | ICD-10-CM

## 2020-01-19 DIAGNOSIS — F17210 Nicotine dependence, cigarettes, uncomplicated: Principal | ICD-10-CM

## 2020-01-19 DIAGNOSIS — K5090 Crohn's disease, unspecified, without complications: Principal | ICD-10-CM

## 2020-01-19 DIAGNOSIS — M542 Cervicalgia: Principal | ICD-10-CM

## 2020-01-19 DIAGNOSIS — Z880 Allergy status to penicillin: Principal | ICD-10-CM

## 2020-01-19 MED ORDER — OXYCODONE-ACETAMINOPHEN 5 MG-325 MG TABLET
ORAL_TABLET | ORAL | 0 refills | 2.00000 days | Status: CP | PRN
Start: 2020-01-19 — End: 2020-01-24

## 2020-01-19 MED ADMIN — oxyCODONE-acetaminophen (PERCOCET) 5-325 mg tablet 1 tablet: 1 | ORAL | @ 15:00:00 | Stop: 2020-01-19
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
## 2020-01-23 MED ORDER — OXYCODONE-ACETAMINOPHEN 10 MG-325 MG TABLET
Freq: Three times a day (TID) | ORAL | 0.00000 days | PRN
Start: 2020-01-23 — End: ?

## 2020-01-25 NOTE — Unmapped (Signed)
Complex Case Management  SUMMARY NOTE    Advocate Case Manager spoke with patient and verified correct patient using two identifiers today to introduce the Complex Case Management program.     Discussed the following:  Program Services    Program status: Interested     Additional items discussed: Patient reports he is doing better but still having significant pain in shoulder. Patient reports he has been icing shoulder. Discussed RICE, rest, ice, compression, and elevation. Patient has run out of Percocet. Discussed using Ibuprofen and Tylenol to help with pain/discomfort. Also notifying PCP Dr. Domingo Madeira of pian/discomfort to see if Percocet could be ordered. Patient verbalized understanding. Patient is scheduled to see orthopedist on 02/28/20. Patient is interested in enrollment to CCM program will contact patient for assessment call 01/26/20 at 1500.         CC Note     This patient is currently Under Review for Complex Case Management services. For progress, care plan changes, updates or recent discharges please contact CM.

## 2020-01-26 DIAGNOSIS — M75101 Unspecified rotator cuff tear or rupture of right shoulder, not specified as traumatic: Principal | ICD-10-CM

## 2020-01-26 DIAGNOSIS — K50818 Crohn's disease of both small and large intestine with other complication: Principal | ICD-10-CM

## 2020-01-27 NOTE — Unmapped (Signed)
Writer called patient during scheduled nutrition appointment. Patient was unable to speak at this time and asked to reschedule. Appointment rescheduled for 1.13.22 at 1:00 pm.      Tretha Sciara, RD, LDN, CDCES  Registered Dietitian Nutritionist  Certified Diabetes Care and Education Specialist
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
## 2020-02-02 ENCOUNTER — Ambulatory Visit: Admit: 2020-02-02 | Discharge: 2020-02-03 | Payer: MEDICARE | Attending: Registered" | Primary: Registered"

## 2020-02-02 NOTE — Unmapped (Signed)
COMPLEX CASE MANAGEMENT   Brief Note      Confirmed with patient that Dr. Domingo Madeira is PCP. Advised working on care plan and will send to patient shortly. Patient verbalized understanding.

## 2020-02-02 NOTE — Unmapped (Deleted)
Nutrition Consult Note    Weight History:  Wt Readings from Last 6 Encounters:   08/11/19 66.7 kg (147 lb)   05/11/19 68 kg (150 lb)   03/19/19 63 kg (138 lb 14.2 oz)   03/11/19 67.1 kg (148 lb)   02/22/19 64 kg (141 lb 1.6 oz)   02/12/19 59.4 kg (131 lb)       Relevant Medications, Herbs, Supplements:    Current Outpatient Medications:   ???  celecoxib (CELEBREX) 200 MG capsule, Take 1 capsule (200 mg total) by mouth daily. Contact your PCP for refills if > 90 days is needed., Disp: 60 capsule, Rfl: 0  ???  citalopram (CELEXA) 10 MG tablet, Take 1 tablet (10 mg total) by mouth daily., Disp: 90 tablet, Rfl: 3  ???  dronabinoL (MARINOL) 10 MG capsule, TAKE 1 CAPSULE BY MOUTH TWICE DAILY 30 MINUTES BEFORE A MEAL, Disp: 60 capsule, Rfl: 0  ???  gabapentin (NEURONTIN) 100 MG capsule, , Disp: , Rfl:   ???  gabapentin (NEURONTIN) 300 MG capsule, Take 1 capsule (300 mg total) by mouth Three (3) times a day. Takes as needed, Disp: 270 capsule, Rfl: 1  ???  loperamide (IMODIUM A-D) 2 mg tablet, Take 1 tablet (2 mg total) by mouth two (2) times a day as needed (nausea when skipping oxycodone)., Disp: 30 tablet, Rfl: 0  ???  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  scopolamine (TRANSDERM-SCOP) 1 mg over 3 days, Place 1 patch (1.5 mg total) on the skin every third day. (Patient not taking: Reported on 01/26/2020), Disp: 10 patch, Rfl: 12  ???  sildenafil (VIAGRA) 50 MG tablet, Take 50 mg by mouth daily as needed for erectile dysfunction., Disp: , Rfl:   ???  therapeutic multivitamin (THERAGRAN) tablet, Take 1 tablet by mouth daily. , Disp: , Rfl:   ???  ustekinumab (STELARA) 90 mg/mL Syrg syringe, Inject the contents of 1 syringe (90 mg total) under the skin every 8 weeks., Disp: 1 mL, Rfl: 5    Relevant Labs:  No results found for: A1C   No components found for: LDLCALC   BP Readings from Last 3 Encounters:   01/19/20 150/87   08/11/19 149/84   05/11/19 128/91     Lab Results   Component Value Date    CHOL 151 03/11/2019     Lab Results   Component Value Date    HDL 51 03/11/2019     Lab Results   Component Value Date    LDL 74 03/11/2019     Lab Results   Component Value Date    VLDL 25.8 03/11/2019     Lab Results   Component Value Date    CHOLHDLRATIO 3.0 03/11/2019     Lab Results   Component Value Date    TRIG 129 03/11/2019    TRIG 158 (H) 10/23/2016    TRIG 131 10/11/2016     Lab Results   Component Value Date    VITDTOTAL 27.3 05/09/2017     Lab Results   Component Value Date    VITAMINB12 268 05/13/2019     24-Hour recall/usual intake:    Time Intake   Breakfast ***   Snack (AM) ***   Lunch ***   Snack (PM) ***   Dinner ***   Snack (HS) ***     Additional Pertinent Nutrition/Lifestyle-Related Information:  Water: ***  Alcohol intake: ***  Other Beverages: ***  Dining out: ***  Snacks: ***  Nighttime snacking: ***  Eating pace: ***  Emotional eating: ***  Food preparation: ***  Likes: ***  Dislikes: ***    Edema: ***   Gastrointestinal: ***   Abdominal pain: ***  Steatorrhea: ***  Nausea/vomiting: ***  Reflux: ***  Gastrointestinal surgical history: ***  Chewing/swallowing: ***  Food allergies/intolerances/sensitivities: ***    Patient stated reason for nutrition changes: ***    Physical Activity: {misc; exercise types:16438}   Barriers to physical activity: ***     Other: *** edentulous     Estimated Daily Needs:  {Energy Needs:57594}  *** g protein  {Nutrient Needs:58116:p}    Nutrition Intervention:  {Nutrition Interventions:3047301}    Materials Provided:  ***    Patient Stated Nutrition Goals:  ***    Follow-up:  ***    Tretha Sciara, RD, LDN, CDCES  Registered Dietitian Nutritionist   Certified Diabetes Care and Education Specialist

## 2020-02-04 NOTE — Unmapped (Signed)
Addended by: Ross Ludwig D on: 02/04/2020 11:23 AM     Modules accepted: Orders
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
## 2020-02-04 NOTE — Unmapped (Signed)
COMPLEX CASE MANAGEMENT   Brief Note        Message sent to Dr. Charlotta Newton via Epic in basket on 01/31/20 with no response. Contacted office today and spoke to Essence. Essence states patient has not been seen by Dr. Domingo Madeira in over 1 year. Advised Dr. Domingo Madeira listed in Epic as PCP and patient states he is his PCP but will follow up with Dr. Clyda Hurdle and Dr. Domingo Madeira. Epic message sent to both providers and awaiting response.

## 2020-02-07 ENCOUNTER — Ambulatory Visit: Admit: 2020-02-07 | Discharge: 2020-02-08 | Payer: MEDICARE

## 2020-02-07 DIAGNOSIS — M25511 Pain in right shoulder: Principal | ICD-10-CM

## 2020-02-07 DIAGNOSIS — S143XXA Injury of brachial plexus, initial encounter: Principal | ICD-10-CM

## 2020-02-07 MED ORDER — DICLOFENAC SODIUM 75 MG TABLET,DELAYED RELEASE
ORAL_TABLET | Freq: Two times a day (BID) | ORAL | 1 refills | 20.00000 days | Status: CP
Start: 2020-02-07 — End: 2020-02-21

## 2020-02-07 MED ORDER — KETOROLAC 10 MG TABLET
ORAL_TABLET | Freq: Three times a day (TID) | ORAL | 0 refills | 4.00000 days | Status: CP | PRN
Start: 2020-02-07 — End: 2020-02-18
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
## 2020-02-07 NOTE — Unmapped (Signed)
COMPLEX CASE MANAGEMENT   FOLLOW UP NOTE  Summary:  Advocate Case Manager spoke with patient and verified correct patient using two identifiers today for Complex Case Management follow up. Patient currently resides at Home. Primary concern is medication refills, dental partials.      Subjective:    Patient/caregiver reported he is doing ok. Inquired if CM has heard from PCP regarding pai medication refills. Advised after several messages confirmed from Dr. Rose Fillers patient will need to be seen by Dr. Domingo Madeira in clinic practice to reestablish care with Dr. Domingo Madeira. Contacted patient and provided Dr. Domingo Madeira phone number and explained he will need to call directly in order to arrange appointment. Patient verbalized understanding. Patient reports he heard back from [facility] regarding request for new partials. Patient states he was told he should get a new dentist and establish care to assess if new partials are possible. Advised will send patient some cost effective dentist in area via mail. Advised will follow up in 2 weeks.    2. Patient also seen by orthopedic clinic today and prescribed Toradol 10 mg po 8 hours for pain up to 4 days and Voltaren 75 mg po BID for 14 days. Patient expressed concern that pain medication is just inflammatory and won't help. Advised both medications work really well to help relieve pain and inflammation and to give it time. Also reiterated importance of ice/heat and following discharge instructions. Understanding verbalized.   Patient reports he has made appointment with Dr Domingo Madeira but unable to get in till 03/03/20.   Advised will follow up in 2 weeks.     Objective:     Screenings Completed during visit: None completed at this visit    Barriers to care: Cost of Care, Financial Stress and Difficulty Accessing Healthcare    Interventions provided: Contact Information Provided, Care Coordination, Referral Facilitation, Supportive Listening       Plan:     1. Case Management to: Follow up in 2 weeks send dental resources via mail    2. Patient/caregiver to: contact Dr. Mylo Red office for appointment and new dentist appt.     Discuss at next outreach: Referral Follow-Up    Care Coordination Note updated in [facility]: Yes    Future Appointments   Date Time Provider Department Center   02/10/2020  1:00 PM Alani Avery, RD/LDN [place] [facility]   03/06/2020  1:00 PM Gonzella Lex, MD [facility]       Problem List     ??? Nausea    ??? Status post intestinal bypass or anastomosis (Chronic)    ??? Crohn's disease of both small and large intestine with other complication (Chronic)    ??? Rotator cuff syndrome    ??? ABDOMINAL PAIN    ??? Traumatic partial tear of right biceps tendon    ??? Hyponatremia    ??? High potassium levels          Allergies:   Allergies   Allergen Reactions   ??? Tramadol Itching and Anxiety     Other reaction(s): Other (See Comments)   ??? Penicillins      Other reaction(s): Itching-Allergy   ??? Dilaudid [Hydromorphone] Anxiety   ??? Morphine Itching       Medications:  Prior to Admission medications    Medication Dose, Route, Frequency   citalopram (CELEXA) 10 MG tablet 10 mg, Oral, Daily PRN   diclofenac (VOLTAREN) 75 MG EC tablet 75 mg, Oral, 2 times a day (standard),  Then take on an as needed basis for pain.   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          Mailey Landstrom D Adiah Guereca  02/07/2020

## 2020-02-07 NOTE — Unmapped (Signed)
I saw and evaluated the patient, participating in the key portions of the service.  I reviewed the resident???s note.  I agree with the resident???s findings and plan. Cruz Condon, MD

## 2020-02-07 NOTE — Unmapped (Signed)
Patient/Caregiver called into to the main line of the Personal Health Advocate Program.   Care Coordinator transferred phone call to another team member .    Laurene Footman - High Risk Care Coordinator   [facility] Clinical Services  [address] [place], [place] [address]  p. [phone]  [email or web address]

## 2020-02-08 NOTE — Unmapped (Signed)
Patient called Clinical research associate yesterday at 9:35 am and left a message Primary school teacher to call back. Writer returned patient's phone call today. Patient stated he was attempting to get in touch with his doctors and no longer requires assistance. Writer confirmed nutrition appointment on 1.13.22 at 1:00 pm.     Tretha Sciara, RD, LDN, CDCES  Registered Dietitian Nutritionist  Certified Diabetes Care and Education Specialist

## 2020-02-10 NOTE — Unmapped (Signed)
Writer called patient during scheduled nutrition appointment. Patient was unable to speak at this time and asked to reschedule. Appointment rescheduled for 02/28/2020 at 11:00 am.      Tretha Sciara, RD, LDN, CDCES  Registered Dietitian Nutritionist  Certified Diabetes Care and Education Specialist

## 2020-02-14 MED ORDER — DRONABINOL 10 MG CAPSULE
ORAL_CAPSULE | 0 refills | 0 days
Start: 2020-02-14 — End: ?

## 2020-02-15 MED ORDER — DRONABINOL 10 MG CAPSULE
ORAL_CAPSULE | 0 refills | 0 days | Status: CP
Start: 2020-02-15 — End: 2020-03-20

## 2020-02-17 ENCOUNTER — Telehealth: Payer: Self-pay | Admitting: Anesthesiology

## 2020-02-17 NOTE — Telephone Encounter (Signed)
Please advise 

## 2020-02-17 NOTE — Unmapped (Signed)
[facility] Refill Coordination Note    Specialty Medication(s) to be Shipped:   Inflammatory Disorders: Stelara    Other medication(s) to be shipped: No additional medications requested for fill at this time     Rodney Bender, DOB: 08-06-1970  Phone: [phone] (home)       All above HIPAA information was verified with patient.     Was a Nurse, learning disability used for this call? No    Completed refill call assessment today to schedule patient's medication shipment from the [facility] [phone]).       Specialty medication(s) and dose(s) confirmed: Regimen is correct and unchanged.   Changes to medications: Rodney Bender reports no changes at this time.  Changes to insurance: No  Questions for the pharmacist: No    Confirmed patient received Welcome Packet with first shipment. The patient will receive a drug information handout for each medication shipped and additional FDA Medication Guides as required.       DISEASE/MEDICATION-SPECIFIC INFORMATION        For patients on injectable medications: Patient currently has 0 doses left.  Next injection is scheduled for 1/29.    SPECIALTY MEDICATION ADHERENCE     Medication Adherence    Patient reported X missed doses in the last month: 0  Specialty Medication: STELARA 90 MG/ML   Patient is on additional specialty medications: No  Informant: patient  Confirmed plan for next specialty medication refill: delivery by pharmacy  Refills needed for supportive medications: not needed          Refill Coordination    Has the Patients' Contact Information Changed: No  Is the Shipping Address Different: No           STELARA 90 mg/ml: 10 days of medicine on hand         SHIPPING     Shipping address confirmed in Epic.     Delivery Scheduled: Yes, Expected medication delivery date: 1/25.     Medication will be delivered via UPS to the prescription address in Epic WAM.    Jolene Schimke   [facility] Specialty Technician

## 2020-02-17 NOTE — Telephone Encounter (Signed)
Patient will be out of meds on Monday. He forgot to call for refill appt with Dr. Andree Elk. Please ask Dr. Holley Raring if he will do virtual visit with patient and cover 1 month scripts then I will make sure patient has appt with Dr. Andree Elk for Feb. Please advise

## 2020-02-18 MED ORDER — CELECOXIB 200 MG CAPSULE
ORAL_CAPSULE | Freq: Every day | ORAL | 3 refills | 60.00000 days | Status: CP
Start: 2020-02-18 — End: 2020-03-06

## 2020-02-20 DIAGNOSIS — K5000 Crohn's disease of small intestine without complications: Principal | ICD-10-CM

## 2020-02-21 ENCOUNTER — Encounter: Payer: Self-pay | Admitting: Student in an Organized Health Care Education/Training Program

## 2020-02-21 ENCOUNTER — Ambulatory Visit
Payer: Medicare Other | Attending: Student in an Organized Health Care Education/Training Program | Admitting: Student in an Organized Health Care Education/Training Program

## 2020-02-21 ENCOUNTER — Other Ambulatory Visit: Payer: Self-pay

## 2020-02-21 ENCOUNTER — Ambulatory Visit: Admit: 2020-02-21 | Discharge: 2020-02-22 | Payer: MEDICARE | Attending: Registered" | Primary: Registered"

## 2020-02-21 DIAGNOSIS — G8929 Other chronic pain: Secondary | ICD-10-CM

## 2020-02-21 DIAGNOSIS — M542 Cervicalgia: Secondary | ICD-10-CM

## 2020-02-21 DIAGNOSIS — F1190 Opioid use, unspecified, uncomplicated: Secondary | ICD-10-CM | POA: Diagnosis not present

## 2020-02-21 DIAGNOSIS — G894 Chronic pain syndrome: Secondary | ICD-10-CM

## 2020-02-21 DIAGNOSIS — K5090 Crohn's disease, unspecified, without complications: Secondary | ICD-10-CM | POA: Diagnosis not present

## 2020-02-21 DIAGNOSIS — M549 Dorsalgia, unspecified: Secondary | ICD-10-CM

## 2020-02-21 DIAGNOSIS — Z79891 Long term (current) use of opiate analgesic: Secondary | ICD-10-CM

## 2020-02-21 DIAGNOSIS — M47816 Spondylosis without myelopathy or radiculopathy, lumbar region: Secondary | ICD-10-CM

## 2020-02-21 MED ORDER — OXYCODONE-ACETAMINOPHEN 10-325 MG PO TABS: 1.0000 | ORAL_TABLET | Freq: Three times a day (TID) | ORAL | 0 refills | Status: DC | PRN

## 2020-02-21 MED FILL — STELARA 90 MG/ML SUBCUTANEOUS SYRINGE: SUBCUTANEOUS | 30 days supply | Qty: 1 | Fill #4
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
## 2020-02-21 NOTE — Unmapped (Signed)
Writer called patient during scheduled nutrition appointment. Patient was unable to speak at this time and asked to reschedule. Appointment rescheduled for 03/08/2020 at 3:00 pm.      Tretha Sciara, RD, LDN, CDCES  Registered Dietitian Nutritionist  Certified Diabetes Care and Education Specialist

## 2020-02-24 NOTE — Unmapped (Signed)
COMPLEX CASE MANAGEMENT   FOLLOW UP NOTE  Summary:  Advocate Case Manager spoke with patient and verified correct patient using two identifiers today for Complex Case Management follow up. Patient currently resides at Home. Primary concern is pain control     Subjective:    Patient/caregiver reported pain has been the same since last conversation. Reports Toradol and Voltaren have not really helped but he is managing pain level by applying ice/heat. Also, discussed with patient trying topical pain control ointments such as Stopain spray or Salon pas. Patient verbalized understanding. States he will discuss with MD Dr. Domingo Madeira on 03/10/20 and follow up with Ortho MD on 03/06/20. Advised patient he will be transferred to [facility]. Patient verbalized understanding. Provided contact information. Patient agreeable to follow up call in 2 weeks.     Objective:     Screenings Completed during visit: None completed at this visit    Barriers to care: Cost of Care and Fractured Care    Interventions provided: RD Referral, Care Coordination, Referral Facilitation and Supportive Listening         PDiscuss at next outreach: Referral Follow-Up    Care Coordination Note updated in [facility]: No    Future Appointments   Date Time Provider Department Center   02/28/2020 11:00 AM Alani Forestine Na, RD/LDN [facility]   03/06/2020  1:00 PM Gonzella Lex, MD [facility]   03/10/2020  2:00 PM Suanne Marker, MD [facility]       Problem List     ??? Nausea    ??? Status post intestinal bypass or anastomosis (Chronic)    ??? Crohn's disease of both small and large intestine with other complication (Chronic)    ??? Rotator cuff syndrome    ??? ABDOMINAL PAIN    ??? Traumatic partial tear of right biceps tendon    ??? Hyponatremia    ??? High potassium levels          Allergies:   Allergies   Allergen Reactions   ??? Tramadol Itching and Anxiety     Other reaction(s): Other (See Comments)   ??? Penicillins      Other reaction(s): Itching-Allergy   ??? Dilaudid [Hydromorphone] Anxiety   ??? Morphine Itching       Medications:  Prior to Admission medications    Medication Dose, Route, Frequency   celecoxib (CELEBREX) 200 MG capsule 200 mg, Oral, Daily (standard), Take one pill daily for 14 days.  Then convert to one pill daily on an as needed basis for pain.  Discontinue all other NSAID medications.   citalopram (CELEXA) 10 MG tablet 10 mg, Oral, Daily PRN   dronabinoL (MARINOL) 10 MG capsule TAKE 1 CAPSULE BY MOUTH TWICE DAILY 30  MINUTES  BEFORE  A  MEAL   gabapentin (NEURONTIN) 300 MG capsule 300 mg, Oral, 3 times a day (standard), Takes as needed   loperamide (IMODIUM A-D) 2 mg tablet 2 mg, Oral   naloxone (NARCAN) 4 mg nasal spray For respiratory depression from opioids   oxyCODONE-acetaminophen (PERCOCET) 10-325 mg per tablet 1 tablet, Oral, Every 8 hours PRN   scopolamine (TRANSDERM-SCOP) 1 mg over 3 days 1 patch, Transdermal, Every 72 hours  Patient not taking: Reported on 01/26/2020   sildenafil (VIAGRA) 50 MG tablet 50 mg, Oral, Daily PRN   therapeutic multivitamin (THERAGRAN) tablet 1 tablet, Oral, Daily (standard)   ustekinumab (STELARA) 90 mg/mL Syrg syringe Inject the contents of 1 syringe (90  mg total) under the skin every 8 weeks.          Tarissa Kerin D Rockford Leinen  02/24/2020

## 2020-02-28 ENCOUNTER — Ambulatory Visit: Admit: 2020-02-28 | Discharge: 2020-02-29 | Payer: MEDICARE | Attending: Registered" | Primary: Registered"

## 2020-02-28 NOTE — Unmapped (Signed)
Writer called patient during scheduled nutrition appointment. Patient was unable to speak at this time and asked to reschedule. Appointment rescheduled for 03/14/2020 at 2:00 pm.      Tretha Sciara, RD, LDN, CDCES  Registered Dietitian Nutritionist  Certified Diabetes Care and Education Specialist

## 2020-03-06 ENCOUNTER — Ambulatory Visit: Admit: 2020-03-06 | Discharge: 2020-03-07 | Payer: MEDICARE

## 2020-03-06 DIAGNOSIS — M75101 Unspecified rotator cuff tear or rupture of right shoulder, not specified as traumatic: Principal | ICD-10-CM
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
## 2020-03-06 NOTE — Unmapped (Signed)
MRI Scheduling and follow up instructions:    Your [facility] Provider has ordered an MRI test for you. The Radiology Department should call you directly in 2-3 business days to get this test scheduled. If you do not hear from the Radiology Department by this time, please feel free to call them , by calling: [phone], option 1. Once you have the MRI scheduled, please call the [facility] Call center to make a follow up appointment with your provider to get the results of the test. The call center number is: [phone].     We  DO NOT give the results over the phone.    PLEASE NOTE:    For MRI to be done outside of [place]:  Please call our financial counselor to obtain insurance authorization at the facility where the MRI will be performed.  Phone number:  [phone]    Thank you ,    [facility]

## 2020-03-08 ENCOUNTER — Ambulatory Visit: Admit: 2020-03-08 | Discharge: 2020-03-09 | Payer: MEDICARE | Attending: Registered" | Primary: Registered"

## 2020-03-08 NOTE — Unmapped (Signed)
COMPLEX CASE MANAGEMENT   FOLLOW UP NOTE  Summary:  Advocate Case Manager spoke with patient and verified correct patient using two identifiers today for Complex Case Management follow up. Patient currently resides at Home. Primary concern is pain control.      Subjective:    Patient/caregiver reported he has an appointment to see his PCP and is hoping he will address the pain in his right shoulder. Patient has a MRI scheduled for 04/06/20.  Patient also has appt with Alani (RD) 03/08/20. CM discussed that RD would be beneficial to help him with his Crohn's. Patient voiced understanding.    Objective:     Screenings Completed during visit: None completed at this visit    Barriers to care: Health Literacy    Interventions provided: Supportive Listening     Progress towards Pain goal : No Change 03/08/20    Plan:     1. Case Management to: Follow up in 2 weeks    2. Patient/caregiver to: Call PCP if questions/concerns arise.    Discuss at next outreach: R shoulder pain    Care Coordination Note updated in [facility]: Yes    Future Appointments   Date Time Provider Department Center   03/08/2020  3:00 PM Alani Denton, RD/LDN [place] [facility]   03/10/2020  2:00 PM Suanne Marker, MD [facility]   04/06/2020  2:00 PM [facility] RM 8 IFLUOUW [place]   04/06/2020  3:15 PM [facility] MRI RM 1 IMRIUCH [place]       Problem List     ??? Nausea    ??? Status post intestinal bypass or anastomosis (Chronic)    ??? Crohn's disease of both small and large intestine with other complication (Chronic)    ??? Rotator cuff syndrome    ??? ABDOMINAL PAIN    ??? Traumatic partial tear of right biceps tendon    ??? Hyponatremia    ??? High potassium levels          Allergies:   Allergies   Allergen Reactions   ??? Tramadol Itching and Anxiety     Other reaction(s): Other (See Comments)   ??? Penicillins      Other reaction(s): Itching-Allergy   ??? Dilaudid [Hydromorphone] Anxiety   ??? Morphine Itching       Medications:  Prior to Admission medications Medication Dose, Route, Frequency   citalopram (CELEXA) 10 MG tablet 10 mg, Oral, Daily PRN   dronabinoL (MARINOL) 10 MG capsule TAKE 1 CAPSULE BY MOUTH TWICE DAILY 30  MINUTES  BEFORE  A  MEAL   loperamide (IMODIUM A-D) 2 mg tablet 2 mg, Oral   naloxone (NARCAN) 4 mg nasal spray For respiratory depression from opioids   oxyCODONE-acetaminophen (PERCOCET) 10-325 mg per tablet 1 tablet, Oral, Every 8 hours PRN   scopolamine (TRANSDERM-SCOP) 1 mg over 3 days 1 patch, Transdermal, Every 72 hours  Patient not taking: Reported on 01/26/2020   sildenafil (VIAGRA) 50 MG tablet 50 mg, Oral, Daily PRN   therapeutic multivitamin (THERAGRAN) tablet 1 tablet, Oral, Daily (standard)   ustekinumab (STELARA) 90 mg/mL Syrg syringe Inject the contents of 1 syringe (90 mg total) under the skin every 8 weeks.          Sherlyn Hay, RN  03/08/2020

## 2020-03-08 NOTE — Unmapped (Unsigned)
Nutrition Consult Note    Weight History:  Wt Readings from Last 6 Encounters:   08/11/19 66.7 kg (147 lb)   05/11/19 68 kg (150 lb)   03/19/19 63 kg (138 lb 14.2 oz)   03/11/19 67.1 kg (148 lb)   02/22/19 64 kg (141 lb 1.6 oz)   02/12/19 59.4 kg (131 lb)       Relevant Medications, Herbs, Supplements:    Current Outpatient Medications:   ???  citalopram (CELEXA) 10 MG tablet, Take 10 mg by mouth daily as needed., Disp: , Rfl:   ???  dronabinoL (MARINOL) 10 MG capsule, TAKE 1 CAPSULE BY MOUTH TWICE DAILY 30  MINUTES  BEFORE  A  MEAL, Disp: 60 capsule, Rfl: 0  ???  loperamide (IMODIUM A-D) 2 mg tablet, Take 2 mg by mouth., Disp: , Rfl:   ???  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  oxyCODONE-acetaminophen (PERCOCET) 10-325 mg per tablet, Take 1 tablet by mouth every eight (8) hours as needed., Disp: , Rfl:   ???  scopolamine (TRANSDERM-SCOP) 1 mg over 3 days, Place 1 patch (1.5 mg total) on the skin every third day. (Patient not taking: Reported on 01/26/2020), Disp: 10 patch, Rfl: 12  ???  sildenafil (VIAGRA) 50 MG tablet, Take 50 mg by mouth daily as needed for erectile dysfunction., Disp: , Rfl:   ???  therapeutic multivitamin (THERAGRAN) tablet, Take 1 tablet by mouth daily. , Disp: , Rfl:   ???  ustekinumab (STELARA) 90 mg/mL Syrg syringe, Inject the contents of 1 syringe (90 mg total) under the skin every 8 weeks., Disp: 1 mL, Rfl: 5    Relevant Labs:  No results found for: A1C   No components found for: LDLCALC   BP Readings from Last 3 Encounters:   01/19/20 150/87   08/11/19 149/84   05/11/19 128/91     Lab Results   Component Value Date    CHOL 151 03/11/2019     Lab Results   Component Value Date    HDL 51 03/11/2019     Lab Results   Component Value Date    LDL 74 03/11/2019     Lab Results   Component Value Date    VLDL 25.8 03/11/2019     Lab Results   Component Value Date    CHOLHDLRATIO 3.0 03/11/2019     Lab Results   Component Value Date    TRIG 129 03/11/2019 TRIG 158 (H) 10/23/2016    TRIG 131 10/11/2016     Lab Results   Component Value Date    VITDTOTAL 27.3 05/09/2017     Lab Results   Component Value Date    VITAMINB12 268 05/13/2019     24-Hour recall/usual intake:    Time Intake   Breakfast ***   Snack (AM) ***   Lunch ***   Snack (PM) ***   Dinner ***   Snack (HS) ***     Additional Pertinent Nutrition/Lifestyle-Related Information:  Water: ***  Alcohol intake: ***  Other Beverages: ***    Dining out: ***  Snacks: ***  Nighttime snacking: ***  Eating pace: ***  Emotional eating: ***  Food preparation: ***  Likes: ***  Dislikes: ***    Edema: ***   Gastrointestinal: ***   Abdominal pain: ***  Steatorrhea: ***  Nausea/vomiting: ***  Reflux: ***  Gastrointestinal surgical history: ***  Chewing/swallowing: ***  Food allergies/intolerances/sensitivities: ***  Patient stated reason for nutrition changes: ***    Physical Activity: {misc; exercise types:16438}   Barriers to physical activity: ***     Other: *** edentulous     Estimated Daily Needs:  {Energy Needs:57594}  *** g protein  {Nutrient Needs:58116:p}    Nutrition Intervention:  {Nutrition Interventions:3047301}    Materials Provided:  ***    Discuss at next outreach:   ***    Patient Stated Nutrition Goals:  ***    Follow-up:  ***    Tretha Sciara, RD, LDN, CDCES  Registered Dietitian Nutritionist   Certified Diabetes Care and Education Specialist

## 2020-03-10 ENCOUNTER — Ambulatory Visit: Admit: 2020-03-10 | Discharge: 2020-03-11 | Payer: MEDICARE

## 2020-03-10 DIAGNOSIS — G8929 Other chronic pain: Principal | ICD-10-CM
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
## 2020-03-10 NOTE — Unmapped (Deleted)
Internal Medicine Clinic Visit    Reason for visit: Anxiety/insomnia     A/P:    {TIP - HCC- RAFF Pilot- Clinical Documentation Specialist Recommendations-  No specialty comments available.   This text will self delete upon signing note:75688}       No diagnosis found.    1. ***    2. ***    3. ***    No follow-ups on file.    Staffed with Dr. ***, {seen/discussed:75519}    __________________________________________________________    HPI:    ***  __________________________________________________________    Problem List:  Patient Active Problem List   Diagnosis   ??? Tobacco use disorder   ??? Chronic nausea   ??? Intestinal bypass or anastomosis status   ??? Mixed, or nondependent drug abuse, in remission (CMS-HCC)   ??? Crohn's disease of both small and large intestine with other complication (CMS-HCC)   ??? Chronic abdominal pain   ??? Rotator cuff syndrome of right shoulder   ??? Abdominal pain   ??? Traumatic partial tear of right biceps tendon   ??? Hyponatremia   ??? Hyperkalemia   ??? Anxiety       Medications:  Reviewed in EPIC  __________________________________________________________    Physical Exam:   Vital Signs:  Vitals:    03/10/20 1417   BP: 136/80   BP Site: L Arm   BP Position: Sitting   BP Cuff Size: Medium   Pulse: 62   Temp: 36.3 ??C   TempSrc: Temporal   SpO2: 96%   Weight: 63.4 kg (139 lb 12.8 oz)   Height: 180.3 cm (5' 11)       Gen: Well appearing, NAD  CV: RRR, no murmurs  Pulm: CTA bilaterally, no crackles or wheezes  Abd: Soft, NTND, normal BS. No HSM.  Ext: No edema  ***    PHQ-9 Score:     GAD-7 Score:       Medication adherence and barriers to the treatment plan have been addressed. Opportunities to optimize healthy behaviors have been discussed. Patient / caregiver voiced understanding.
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
## 2020-03-14 NOTE — Unmapped (Signed)
Complex Case Management  SUMMARY NOTE    Attempted to contact pt today at Cell number to complete initial nutrition assessment. Left message to return call. 2nd attempt, letter sent. (passive decline - 5th reschedule/no show attempt)    Discuss at next visit: n/a     Tretha Sciara, RD, LDN, CDCES  Registered Dietitian Nutritionist  Certified Diabetes Care and Education Specialist

## 2020-03-15 ENCOUNTER — Other Ambulatory Visit: Payer: Self-pay

## 2020-03-15 ENCOUNTER — Ambulatory Visit: Payer: Medicare Other | Attending: Anesthesiology | Admitting: Anesthesiology

## 2020-03-15 DIAGNOSIS — K5090 Crohn's disease, unspecified, without complications: Secondary | ICD-10-CM | POA: Diagnosis not present

## 2020-03-15 DIAGNOSIS — M545 Low back pain, unspecified: Secondary | ICD-10-CM

## 2020-03-15 DIAGNOSIS — G894 Chronic pain syndrome: Secondary | ICD-10-CM

## 2020-03-15 DIAGNOSIS — F1190 Opioid use, unspecified, uncomplicated: Secondary | ICD-10-CM

## 2020-03-15 DIAGNOSIS — M542 Cervicalgia: Secondary | ICD-10-CM

## 2020-03-15 DIAGNOSIS — Z79891 Long term (current) use of opiate analgesic: Secondary | ICD-10-CM

## 2020-03-15 DIAGNOSIS — M549 Dorsalgia, unspecified: Secondary | ICD-10-CM

## 2020-03-15 DIAGNOSIS — M5412 Radiculopathy, cervical region: Secondary | ICD-10-CM

## 2020-03-15 DIAGNOSIS — R109 Unspecified abdominal pain: Secondary | ICD-10-CM

## 2020-03-15 DIAGNOSIS — M47816 Spondylosis without myelopathy or radiculopathy, lumbar region: Secondary | ICD-10-CM

## 2020-03-15 DIAGNOSIS — G8929 Other chronic pain: Secondary | ICD-10-CM

## 2020-03-15 MED ORDER — OXYCODONE-ACETAMINOPHEN 10-325 MG PO TABS
1.0000 | ORAL_TABLET | Freq: Three times a day (TID) | ORAL | 0 refills | Status: DC | PRN
Start: 2020-04-14 — End: 2020-05-11

## 2020-03-15 MED ORDER — OXYCODONE-ACETAMINOPHEN 10-325 MG PO TABS: 1.0000 | ORAL_TABLET | Freq: Three times a day (TID) | ORAL | 0 refills | Status: AC | PRN
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
## 2020-03-18 MED ORDER — DRONABINOL 10 MG CAPSULE
ORAL_CAPSULE | 0 refills | 0 days
Start: 2020-03-18 — End: ?

## 2020-03-20 MED ORDER — DRONABINOL 10 MG CAPSULE
ORAL_CAPSULE | 0 refills | 0 days | Status: CP
Start: 2020-03-20 — End: ?

## 2020-03-22 NOTE — Unmapped (Signed)
COMPLEX CASE MANAGEMENT   FOLLOW UP NOTE  Summary:  Advocate Case Manager spoke with patient and verified correct patient using two identifiers today for Complex Case Management follow up. Patient currently resides at Home. Primary concern is Pain in right shoulder.      Subjective:    Patient/caregiver reported he did go to PCP office to establish care, but reported he did not receive pain medicine, so he left. CM explained that with his issue being the shoulder he needs to follow up with Ortho and discuss next steps. Patient reports he has an MRI on 04/06/20 and did not know where or what time. CM where and what time, CM also gave patient the address and phone number. CM reiterated following up with Ortho after his MRI. Patient reports his pain is a 10 and cannot describe the pain, only that it hurts. CM asked patient if alternative positions/medications help and patient stated No. CM    Objective:     Screenings Completed during visit: None completed at this visit    Barriers to care: Financial Stress    Interventions provided: Pain assessment and alternatives    Progress towards Pain goal : No Change 03/23/20    Plan:     1. Case Management to: Follow up in 2 weeks    2. Patient/caregiver to: call Provider if concerns arise.     Discuss at next outreach: Assess Crohn's and Pain    Care Coordination Note updated in [facility]: Yes    Future Appointments   Date Time Provider Department Center   04/06/2020  2:00 PM [facility] RM 8 [facility]   04/06/2020  3:15 PM [facility] MRI RM 1 [facility] [place]       Problem List     ??? Nausea    ??? Status post intestinal bypass or anastomosis (Chronic)    ??? Crohn's disease of both small and large intestine with other complication (Chronic)    ??? Rotator cuff syndrome    ??? ABDOMINAL PAIN    ??? Traumatic partial tear of right biceps tendon    ??? Hyponatremia    ??? High potassium levels          Allergies:   Allergies   Allergen Reactions   ??? Tramadol Itching and Anxiety     Other reaction(s): Other (See Comments)   ??? Penicillins      Other reaction(s): Itching-Allergy   ??? Dilaudid [Hydromorphone] Anxiety   ??? Morphine Itching       Medications:  Prior to Admission medications    Medication Dose, Route, Frequency   citalopram (CELEXA) 10 MG tablet 10 mg, Oral, Daily PRN   dronabinoL (MARINOL) 10 MG capsule TAKE 1 CAPSULE BY MOUTH TWICE DAILY 30 MINUTES BEFORE A MEAL   loperamide (IMODIUM A-D) 2 mg tablet 2 mg, Oral   naloxone (NARCAN) 4 mg nasal spray For respiratory depression from opioids   oxyCODONE-acetaminophen (PERCOCET) 10-325 mg per tablet 1 tablet, Oral, Every 8 hours PRN   scopolamine (TRANSDERM-SCOP) 1 mg over 3 days 1 patch, Transdermal, Every 72 hours  Patient not taking: Reported on 01/26/2020   sildenafil (VIAGRA) 50 MG tablet 50 mg, Oral, Daily PRN   therapeutic multivitamin (THERAGRAN) tablet 1 tablet, Oral, Daily (standard)   ustekinumab (STELARA) 90 mg/mL Syrg syringe Inject the contents of 1 syringe (90 mg total) under the skin every 8 weeks.          Sherlyn Hay, RN  03/22/2020

## 2020-03-23 MED ORDER — GABAPENTIN 300 MG CAPSULE
ORAL_CAPSULE | 0 refills | 0 days
Start: 2020-03-23 — End: ?

## 2020-03-28 MED ORDER — GABAPENTIN 300 MG CAPSULE
ORAL_CAPSULE | 0 refills | 0 days | Status: CP
Start: 2020-03-28 — End: ?

## 2020-03-29 MED ORDER — GABAPENTIN 300 MG CAPSULE
ORAL_CAPSULE | Freq: Three times a day (TID) | ORAL | 4 refills | 30.00000 days | Status: CP
Start: 2020-03-29 — End: 2021-03-29

## 2020-04-06 ENCOUNTER — Ambulatory Visit: Admit: 2020-04-06 | Discharge: 2020-04-06 | Disposition: A | Discharge status: Home / Self Care | Payer: MEDICARE

## 2020-04-06 ENCOUNTER — Emergency Department
Admit: 2020-04-06 | Discharge: 2020-04-07 | Disposition: A | Discharge status: Home / Self Care | Payer: MEDICARE | Attending: Emergency Medicine

## 2020-04-06 ENCOUNTER — Ambulatory Visit
Admit: 2020-04-06 | Discharge: 2020-04-07 | Disposition: A | Discharge status: Home / Self Care | Payer: MEDICARE | Attending: Emergency Medicine

## 2020-04-06 LAB — COMPREHENSIVE METABOLIC PANEL
ALBUMIN: 3.9 g/dL (ref 3.4–5.0)
ALKALINE PHOSPHATASE: 102 U/L (ref 46–116)
ALT (SGPT): 23 U/L (ref 10–49)
ANION GAP: 7 mmol/L (ref 5–14)
BILIRUBIN TOTAL: 0.8 mg/dL (ref 0.3–1.2)
BLOOD UREA NITROGEN: <5 mg/dL — ABNORMAL LOW (ref 9–23)
CALCIUM: 9.2 mg/dL (ref 8.7–10.4)
CHLORIDE: 102 mmol/L (ref 98–107)
CO2: 19 mmol/L — ABNORMAL LOW (ref 20.0–31.0)
CREATININE: 0.93 mg/dL
EGFR CKD-EPI AA MALE: >90 mL/min/{1.73_m2} (ref >=60)
EGFR CKD-EPI NON-AA MALE: >90 mL/min/{1.73_m2} (ref >=60)
GLUCOSE RANDOM: 72 mg/dL (ref 70–179)
PROTEIN TOTAL: 7.6 g/dL (ref 5.7–8.2)
SODIUM: 128 mmol/L — ABNORMAL LOW (ref 135–145)

## 2020-04-06 LAB — CBC W/ AUTO DIFF
BASOPHILS ABSOLUTE COUNT: 0.1 10*9/L (ref 0.0–0.1)
BASOPHILS RELATIVE PERCENT: 0.6 %
EOSINOPHILS ABSOLUTE COUNT: 0.1 10*9/L (ref 0.0–0.5)
EOSINOPHILS RELATIVE PERCENT: 0.9 %
HEMATOCRIT: 34.3 % — ABNORMAL LOW (ref 39.0–48.0)
HEMOGLOBIN: 12.1 g/dL — ABNORMAL LOW (ref 12.9–16.5)
LYMPHOCYTES ABSOLUTE COUNT: 2.2 10*9/L (ref 1.1–3.6)
LYMPHOCYTES RELATIVE PERCENT: 25.3 %
MEAN CORPUSCULAR HEMOGLOBIN CONC: 35.3 g/dL (ref 32.0–36.0)
MEAN CORPUSCULAR HEMOGLOBIN: 32.2 pg (ref 25.9–32.4)
MEAN CORPUSCULAR VOLUME: 91.4 fL (ref 77.6–95.7)
MEAN PLATELET VOLUME: 7.7 fL (ref 6.8–10.7)
MONOCYTES ABSOLUTE COUNT: 0.8 10*9/L (ref 0.3–0.8)
MONOCYTES RELATIVE PERCENT: 9 %
NEUTROPHILS ABSOLUTE COUNT: 5.5 10*9/L (ref 1.8–7.8)
NEUTROPHILS RELATIVE PERCENT: 64.2 %
PLATELET COUNT: 288 10*9/L (ref 150–450)
RED BLOOD CELL COUNT: 3.76 10*12/L — ABNORMAL LOW (ref 4.26–5.60)
RED CELL DISTRIBUTION WIDTH: 14 % (ref 12.2–15.2)
WBC ADJUSTED: 8.7 10*9/L (ref 3.6–11.2)

## 2020-04-06 LAB — LIPASE: LIPASE: 28 U/L (ref 12–53)

## 2020-04-06 LAB — C-REACTIVE PROTEIN: C-REACTIVE PROTEIN: 4 mg/L (ref <=10.0)

## 2020-04-06 LAB — SEDIMENTATION RATE: ERYTHROCYTE SEDIMENTATION RATE: 11 mm/h (ref 0–15)

## 2020-04-06 MED ADMIN — lidocaine (XYLOCAINE) 10 mg/mL (1 %) injection 5 mL: 5 mL | @ 22:00:00 | Stop: 2020-04-06

## 2020-04-06 MED ADMIN — iohexoL (OMNIPAQUE) 180 mg iodine/mL solution 0-1,000 mL: 0-1000 mL | INTRAVENOUS | @ 22:00:00 | Stop: 2020-04-06

## 2020-04-06 MED ADMIN — gadobenate dimeglumine (MULTIHANCE) 529 mg/mL (0.1mmol/0.2mL) solution 12.68 mL: .1 mmol/kg | INTRAVENOUS | @ 22:00:00 | Stop: 2020-04-06

## 2020-04-07 LAB — TOXASSURE SELECT 13 (MW), URINE

## 2020-04-07 LAB — POTASSIUM: POTASSIUM: 3.6 mmol/L (ref 3.4–4.8)

## 2020-04-07 LAB — AST: AST (SGOT): 25 U/L (ref <=34)

## 2020-04-07 LAB — SYPHILIS SCREEN: SYPHILIS RPR SCREEN: NONREACTIVE

## 2020-04-07 LAB — DIAGNOSTIC HIV AG/AB: HIV ANTIGEN/ANTIBODY COMBO: NONREACTIVE

## 2020-04-07 MED ORDER — DOXYCYCLINE HYCLATE 100 MG CAPSULE
ORAL_CAPSULE | Freq: Two times a day (BID) | ORAL | 0 refills | 10 days | Status: CP
Start: 2020-04-07 — End: 2020-04-17

## 2020-04-07 MED ADMIN — ondansetron (ZOFRAN) injection 4 mg: 4 mg | INTRAVENOUS | @ 05:00:00 | Stop: 2020-04-06

## 2020-04-07 MED ADMIN — fentaNYL (PF) (SUBLIMAZE) injection 100 mcg: 100 ug | INTRAVENOUS | @ 06:00:00 | Stop: 2020-04-07

## 2020-04-07 MED ADMIN — cefTRIAXone (ROCEPHIN) intramuscular injection 500 mg: 500 mg | INTRAMUSCULAR | @ 09:00:00 | Stop: 2020-04-07

## 2020-04-07 MED ADMIN — fentaNYL (PF) (SUBLIMAZE) injection 100 mcg: 100 ug | INTRAVENOUS | @ 03:00:00 | Stop: 2020-04-06

## 2020-04-07 MED ADMIN — ondansetron (ZOFRAN) injection 4 mg: 4 mg | INTRAVENOUS | @ 03:00:00 | Stop: 2020-04-06

## 2020-04-07 MED ADMIN — lactated ringers bolus 1,000 mL: 1000 mL | INTRAVENOUS | @ 03:00:00 | Stop: 2020-04-06

## 2020-04-07 MED ADMIN — doxycycline (VIBRA-TABS) tablet/capsule 100 mg: 100 mg | ORAL | @ 09:00:00 | Stop: 2020-04-07

## 2020-04-07 MED ADMIN — iohexoL (OMNIPAQUE) 350 mg iodine/mL solution 100 mL: 100 mL | INTRAVENOUS | @ 07:00:00 | Stop: 2020-04-07

## 2020-04-07 NOTE — Unmapped (Addendum)
Emergency Department Provider Note        ED Clinical Impression     Final diagnoses:   Generalized abdominal pain (Primary)       ED Assessment/Plan   Rodney Bender is a 50 year old male with a past medical history of Crohn's disease and gastroesophageal GL reflux disease who was seen in the emergency department for 2 weeks of diffuse abdominal pain, nausea with vomiting, and loose stools up to 9 a day, most likely a crohn's flare.    My differential diagnosis includes a crohn's flare, appendicitis, diverticulitis, sbo. Overall given history favor crohn's flare but need cross sectional imaging to make a diagnosis.    I will be signing this case out to the oncoming provider.    Plan is as follows:  - CBC, ESR/CRP  - CT abd/pelvis w/ contrast  - Pain control, anti emetics  - Anticipate admission  - STI testing at patient's request    History     Chief Complaint   Patient presents with   ??? Abdominal Pain     Rodney Bender is a 50 year old male with a past medical history of Crohn's disease and gastroesophageal GL reflux disease who was seen in the emergency department for 2 weeks of diffuse abdominal pain, nausea with vomiting, and loose stools up to 9 a day.  He he reports this feels like the last time he had a Crohn's flare which he estimates was about a year and a half ago.  [name] also tells me that he has had multiple surgeries for Crohn's disease in the past.  Describes his stools as full of mucus and dark sometimes with blood.  No symptoms of anemia specifically no lightheadedness, changes in vision, syncope.  His vomiting has been nonbloody and nonbilious.  [name] has also been taking approximately 2 Percocet tablets a day and was unable to keep the tablet down yesterday or today.  Reports that he did have a fever to 101 Fahrenheit a couple of days ago.          Past Medical History:   Diagnosis Date   ??? Anxiety    ??? Crohn's disease (CMS-HCC)     diagnosed in 1990   ??? GERD (gastroesophageal reflux disease)    ??? Hypertension 10/08/2016       Past Surgical History:   Procedure Laterality Date   ??? COLON SURGERY     ??? PR COLONOSCOPY FLX DX W/COLLJ SPEC WHEN PFRMD Left 11/10/2012    Procedure: COLONOSCOPY, FLEXIBLE, PROXIMAL TO SPLENIC FLEXURE; DIAGNOSTIC, W/WO COLLECTION SPECIMEN BY BRUSH OR WASH;  Surgeon: Malcolm Metro, MD;  [facility];  Service: Gastroenterology   ??? PR COLONOSCOPY FLX DX W/COLLJ SPEC WHEN PFRMD N/A 07/21/2013    Procedure: COLONOSCOPY, FLEXIBLE, PROXIMAL TO SPLENIC FLEXURE; DIAGNOSTIC, W/WO COLLECTION SPECIMEN BY BRUSH OR WASH;  Surgeon: Gwen Pounds, MD;  [facility]: [facility];  Service: Gastroenterology   ??? PR COLONOSCOPY FLX DX W/COLLJ SPEC WHEN PFRMD N/A 08/09/2016    Procedure: COLONOSCOPY, FLEXIBLE, PROXIMAL TO SPLENIC FLEXURE; DIAGNOSTIC, W/WO COLLECTION SPECIMEN BY BRUSH OR WASH;  Surgeon: Janyth Pupa, MD;  Location: [facility];  Service: Gastroenterology   ??? PR COLONOSCOPY FLX DX W/COLLJ SPEC WHEN PFRMD N/A 04/06/2018    Procedure: COLONOSCOPY, FLEXIBLE, PROXIMAL TO SPLENIC FLEXURE; DIAGNOSTIC, W/WO COLLECTION SPECIMEN BY BRUSH OR WASH;  Surgeon: Zetta Bills, MD;  [facility];  Service: Gastroenterology   ???  PR COLONOSCOPY W/BIOPSY SINGLE/MULTIPLE  07/23/2012    Procedure: COLONOSCOPY, FLEXIBLE, PROXIMAL TO SPLENIC FLEXURE; WITH BIOPSY, SINGLE OR MULTIPLE;  Surgeon: Vickii Chafe, MD;  Location: [facility]URES [facility];  Service: Gastroenterology   ??? PR COLONOSCOPY W/BIOPSY SINGLE/MULTIPLE N/A 07/15/2014    Procedure: COLONOSCOPY, FLEXIBLE, PROXIMAL TO SPLENIC FLEXURE; WITH BIOPSY, SINGLE OR MULTIPLE;  Surgeon: Janyth Pupa, MD;  Location: [facility];  Service: Gastroenterology   ??? PR COLONOSCOPY W/BIOPSY SINGLE/MULTIPLE N/A 03/21/2017    Procedure: COLONOSCOPY, FLEXIBLE, PROXIMAL TO SPLENIC FLEXURE; WITH BIOPSY, SINGLE OR MULTIPLE;  Surgeon: Modena Nunnery, MD;  [facility];  Service: Gastroenterology   ??? PR COLONOSCOPY W/BIOPSY SINGLE/MULTIPLE N/A 02/12/2019    Procedure: COLONOSCOPY, FLEXIBLE, PROXIMAL TO SPLENIC FLEXURE; WITH BIOPSY, SINGLE OR MULTIPLE;  Surgeon: Jules Husbands, MD;  Location: [facility];  Service: Gastroenterology   ??? PR COLSC FLEXIBLE W/TRANSENDOSCOPIC BALLOON DILAT N/A 07/15/2014    Procedure: COLONOSCOPY, FLEXIBLE; WITH DILATION BY BALLOON, 1 OR MORE STRICTURES;  Surgeon: Janyth Pupa, MD;  Location: [facility];  Service: Gastroenterology   ??? PR REMVL COLON & TERM ILEUM W/ILEOCOLOSTOMY N/A 09/16/2016    Procedure: COLECTOMY, PARTIAL, WITH REMOVAL OF TERMINAL ILEUM WITH ILEOCOLOSTOMY;  Surgeon: Lady Gary, MD;  Location: MAIN OR [facility];  Service: Gastrointestinal   ??? PR REPAIR BICEPS LONG TENDON Right 03/19/2019    Procedure: TENODESIS LONG TENDON BICEPS;  Surgeon: Gonzella Lex, MD;  Location: ASC OR [facility];  Service: Orthopedics   ??? PR SHLDR ARTHROSCOP,PART ACROMIOPLAS Right 10/02/2018    Procedure: R16 ARTHROSCOPY, SHOULDER, SURGICAL; DECOMPRESS SUBACROMIAL SPACE W/PART ACROMIOPLASTY, Tamala Bari;  Surgeon: Gonzella Lex, MD;  Location: ASC OR [facility];  Service: Orthopedics   ??? PR SHLDR ARTHROSCOP,SURG,W/ROTAT CUFF REPR Right 10/02/2018    Procedure: ARTHROSCOPY, SHOULDER, SURGICAL; WITH ROTATOR CUFF REPAIR;  Surgeon: Gonzella Lex, MD;  Location: ASC OR [facility];  Service: Orthopedics   ??? PR SHLDR ARTHROSCOP,SURG,W/ROTAT CUFF REPR Right 03/19/2019    Procedure: R16 ARTHROSCOPY, SHOULDER, SURGICAL; WITH ROTATOR CUFF REPAIR;  Surgeon: Gonzella Lex, MD;  Location: ASC [facility];  Service: Orthopedics   ??? PR UPPER GI ENDOSCOPY,BIOPSY N/A 07/23/2012    Procedure: UGI ENDOSCOPY; WITH BIOPSY, SINGLE OR MULTIPLE;  Surgeon: Vickii Chafe, MD;  [facility]: [facility];  Service: Gastroenterology   ??? PR UPPER GI ENDOSCOPY,DIAGNOSIS N/A 11/10/2012    Procedure: UGI ENDO, INCLUDE ESOPHAGUS, STOMACH, & DUODENUM &/OR JEJUNUM; DX W/WO COLLECTION SPECIMN, BY BRUSH OR WASH;  Surgeon: Malcolm Metro, MD;  Location: [facility]URES [facility];  Service: Gastroenterology   ??? PR UPPER GI ENDOSCOPY,DIAGNOSIS N/A 07/21/2013    Procedure: UGI ENDO, INCLUDE ESOPHAGUS, STOMACH, & DUODENUM &/OR JEJUNUM; DX W/WO COLLECTION SPECIMN, BY BRUSH OR WASH;  Surgeon: Gwen Pounds, MD;  Location: [facility];  Service: Gastroenterology   ??? PR UPPER GI ENDOSCOPY,DIAGNOSIS N/A 07/15/2014    Procedure: UGI ENDO, INCLUDE ESOPHAGUS, STOMACH, & DUODENUM &/OR JEJUNUM; DX W/WO COLLECTION SPECIMN, BY BRUSH OR WASH;  Surgeon: Janyth Pupa, MD;  Location: [facility]URES [facility];  Service: Gastroenterology   ??? ROTATOR CUFF REPAIR Right 2020   ??? SHOULDER SURGERY     ??? SPINE SURGERY         Family History   Problem Relation Age of Onset   ??? Hyperlipidemia Father    ??? Cancer Father         kidney   ???  Cancer Maternal Aunt    ??? Stroke Mother    ??? No Known Problems Sister    ??? No Known Problems Brother    ??? No Known Problems Maternal Uncle    ??? No Known Problems Paternal Aunt    ??? No Known Problems Paternal Uncle    ??? No Known Problems Maternal Grandmother    ??? No Known Problems Maternal Grandfather    ??? No Known Problems Paternal Grandmother    ??? No Known Problems Paternal Grandfather    ??? Anesthesia problems Neg Hx    ??? Broken bones Neg Hx    ??? Clotting disorder Neg Hx    ??? Collagen disease Neg Hx    ??? Diabetes Neg Hx    ??? Dislocations Neg Hx    ??? Fibromyalgia Neg Hx    ??? Gout Neg Hx    ??? Hemophilia Neg Hx    ??? Osteoporosis Neg Hx    ??? Rheumatologic disease Neg Hx    ??? Scoliosis Neg Hx    ??? Severe sprains Neg Hx    ??? Sickle cell anemia Neg Hx    ??? Spinal Compression Fracture Neg Hx        Social History     Socioeconomic History   ??? Marital status: Married     Spouse name: None   ??? Number of children: None   ??? Years of education: None ??? Highest education level: None   Occupational History     Comment: not working   Tobacco Use   ??? Smoking status: Former Smoker     Packs/day: 1.00     Years: 18.00     Pack years: 18.00     Types: Cigarettes     Start date: 08/27/2003     Quit date: 06/06/2017     Years since quitting: 2.8   ??? Smokeless tobacco: Never Used   ??? Tobacco comment: Pt smokes 1ppd, Pt is interested in tobacco cessation    Vaping Use   ??? Vaping Use: Never used   Substance and Sexual Activity   ??? Alcohol use: No   ??? Drug use: Not Currently     Types: Marijuana   ??? Sexual activity: Yes     Partners: Female     Birth control/protection: Condom   Other Topics Concern   ??? Exercise Not Asked   ??? Living Situation No     Comment: lives with wife   Social History Narrative    Information taken:  03/31/19        Born [place], [place]        Raised with grandmother        2 siblings- pt is the youngest    Brother lives in [place], [place]    Sister lives in [place], [place]        Married for 22yrs to [employer]        No children         Education - completed through 11th grade I got kicked out at 12th grade        Not working, on disability benefits- Crohn's disease     Social Determinants of Health     Financial Resource Strain: Low Risk    ??? Difficulty of Paying Living Expenses: Not hard at all   Food Insecurity: No Food Insecurity   ??? Worried About Running Out of Food in the Last Year: Never true   ??? Ran Out of Food in the  Last Year: Never true   Transportation Needs: No Transportation Needs   ??? Lack of Transportation (Medical): No   ??? Lack of Transportation (Non-Medical): No   Physical Activity: Not on file   Stress: Stress Concern Present   ??? Feeling of Stress : To some extent   Social Connections: Moderately Isolated   ??? Frequency of Communication with Friends and Family: More than three times a week   ??? Frequency of Social Gatherings with Friends and Family: Never   ??? Attends Religious Services: Never   ??? Active Member of Clubs or Organizations: No   ??? Attends Banker Meetings: Never   ??? Marital Status: Married       Review of Systems   Constitutional: Positive for chills and fever.   HENT: Negative.    Eyes: Negative.    Respiratory: Negative.    Cardiovascular: Negative.    Gastrointestinal: Positive for abdominal pain, blood in stool, diarrhea, nausea and vomiting.   Endocrine: Negative.    Genitourinary: Negative.    Musculoskeletal: Negative.    Skin: Negative.    Allergic/Immunologic: Negative.    Neurological: Negative.    Hematological: Negative.    Psychiatric/Behavioral: Negative.        Physical Exam     BP 156/100  - Pulse 63  - Temp 37.2 ??C (98.9 ??F) (Oral)  - Resp 18  - Ht 180.3 cm (5' 11)  - Wt 63.5 kg (140 lb)  - SpO2 100%  - BMI 19.53 kg/m??     Physical Exam    Gen: Well appearing male, appears uncomfortable  Eyes: No scleral icterus, conjugate gaze. Dry mucous membranes  Pulm: CTAB, strong respiratory effort  CVS: RRR no murmurs gallops rubs  Abd: Soft, diffusely tender, not distended. No r/g  Ext: Wwp no pretibial pitting edema  Neuro: CN II-XII intact, a/ox3  Psych: Affect appropriate, cooperative    ED Course     9:42 PM: Patient seen and evaluated. Appears uncomfortable. Difficulty completing laboratory work up as pt will need an ultrasound IV.    10:44 PM: Patient reports having pain at the tip of his penis w/o drainage. He wants to be tested for all STIs. Will order testing           MDM  Reviewed: previous chart, nursing note and vitals         Al Decant, MD  Resident  04/06/20 [id number]       Al Decant, MD  Resident  04/06/20 [id number]       Al Decant, MD  Resident  04/06/20 [phone]

## 2020-04-07 NOTE — Unmapped (Signed)
ED Progress Note    50 year old male with a past medical history of Crohn's disease and gastroesophageal GL reflux disease who was seen in the emergency department for 2 weeks of diffuse abdominal pain, nausea with vomiting, and loose stools up to 9 a day, most likely a crohn's flare.  At time of signout, patient labs and CT scan were pending.  Labs show mild hyponatremia 128.  Inflammatory markers within normal limits.  Patient requested to have STD screening.  These labs are still in process.  CT abdomen pelvis showed no acute Crohn's flares.  No stranding.  Patient had improvement in her abdominal pain.  He tolerated a p.o. challenge without difficulty.  He requested to be treated for STDs given his dysuria.  Will treat with Rocephin 500 mg and discharged on doxycycline.  Patient be discharged with return precautions.

## 2020-04-07 NOTE — Unmapped (Signed)
Pt presenting for abdominal pain, N/V/D, for about 1.5 weeks. Pt states these symptoms are consistent with their Crohn's flare ups. Pt endorses poor PO intake, haven't eaten anything in about 5 days. Pt came down following an MRI for their shoulder d/t their symptoms.

## 2020-04-10 ENCOUNTER — Ambulatory Visit: Admit: 2020-04-10 | Discharge: 2020-04-11 | Payer: MEDICARE
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
## 2020-04-11 NOTE — Unmapped (Signed)
COMPLEX CASE MANAGEMENT   FOLLOW UP NOTE  Summary:  Advocate Case Manager spoke with patient and verified correct patient using two identifiers today for Complex Case Management follow up. Patient currently resides at Home. Primary concern is lab results.      Subjective:    Patient/caregiver reported he is doing better as far as abdominal pain. Patient has been able to eat w/o issues and is back eating as before ED visit. Patient did want to find out results from Urine tests and could not understand Mychart results. CM discussed results with patient and patient stopped antibiotics as asked by ER doctor, if patient was negative of STD's.      Objective:     Screenings Completed during visit: None completed at this visit    Barriers to care: Health Literacy    Interventions provided: Supportive Listening     Progress towards Abdominal pain goal : Completed 04/11/20    Plan:     1. Case Management to: Follow up in 2 weeks    2. Patient/caregiver to: call PCP if questions/concerns arise.    Discuss at next outreach: Graduation from Complex Case Management- STD teaching.    Care Coordination Note updated in [facility]: Yes    Future Appointments   Date Time Provider Department Center   04/20/2020 11:00 AM Ovidio Hanger, MD [facility]   05/22/2020  1:00 PM Gonzella Lex, MD [facility]       Problem List     ??? Nausea    ??? Status post intestinal bypass or anastomosis (Chronic)    ??? Crohn's disease of both small and large intestine with other complication (Chronic)    ??? Rotator cuff syndrome    ??? ABDOMINAL PAIN    ??? Traumatic partial tear of right biceps tendon    ??? Hyponatremia    ??? High potassium levels          Allergies:   Allergies   Allergen Reactions   ??? Tramadol Itching and Anxiety     Other reaction(s): Other (See Comments)   ??? Penicillins      Other reaction(s): Itching-Allergy   ??? Dilaudid [Hydromorphone] Anxiety   ??? Morphine Itching       Medications:  Prior to Admission medications Medication Dose, Route, Frequency   citalopram (CELEXA) 10 MG tablet 10 mg, Oral, Daily PRN   doxycycline (VIBRAMYCIN) 100 MG capsule 100 mg, Oral, 2 times a day (standard)   dronabinoL (MARINOL) 10 MG capsule TAKE 1 CAPSULE BY MOUTH TWICE DAILY 30 MINUTES BEFORE A MEAL   gabapentin (NEURONTIN) 300 MG capsule 300 mg, Oral, 3 times a day (standard)   loperamide (IMODIUM A-D) 2 mg tablet 2 mg, Oral   naloxone (NARCAN) 4 mg nasal spray For respiratory depression from opioids   oxyCODONE-acetaminophen (PERCOCET) 10-325 mg per tablet 1 tablet, Oral, Every 8 hours PRN   scopolamine (TRANSDERM-SCOP) 1 mg over 3 days 1 patch, Transdermal, Every 72 hours  Patient not taking: Reported on 01/26/2020   sildenafil (VIAGRA) 50 MG tablet 50 mg, Oral, Daily PRN   therapeutic multivitamin (THERAGRAN) tablet 1 tablet, Oral, Daily (standard)   ustekinumab (STELARA) 90 mg/mL Syrg syringe Inject the contents of 1 syringe (90 mg total) under the skin every 8 weeks.          Sherlyn Hay, RN  04/11/2020

## 2020-04-13 NOTE — Unmapped (Signed)
[facility] Refill Coordination Note    Specialty Medication(s) to be Shipped:   Inflammatory Disorders: Stelara    Other medication(s) to be shipped: No additional medications requested for fill at this time     Rodney Bender, DOB: Jun 08, 1970  Phone: [phone] (home)       All above HIPAA information was verified with patient.     Was a Nurse, learning disability used for this call? No    Completed refill call assessment today to schedule patient's medication shipment from the [facility] [phone]).       Specialty medication(s) and dose(s) confirmed: Regimen is correct and unchanged.   Changes to medications: Rodney Bender reports no changes at this time.  Changes to insurance: No  Questions for the pharmacist: No    Confirmed patient received Welcome Packet with first shipment. The patient will receive a drug information handout for each medication shipped and additional FDA Medication Guides as required.       DISEASE/MEDICATION-SPECIFIC INFORMATION        For patients on injectable medications: Patient currently has 0 doses left.  Next injection is scheduled for 3/20.    SPECIALTY MEDICATION ADHERENCE     Medication Adherence    Patient reported X missed doses in the last month: 0  Specialty Medication: STELARA 90 MG/ML   Patient is on additional specialty medications: No  Informant: patient  Confirmed plan for next specialty medication refill: delivery by pharmacy  Refills needed for supportive medications: not needed          Refill Coordination    Has the Patients' Contact Information Changed: No  Is the Shipping Address Different: No           STELARA 90 mg/ml: 3 days of medicine on hand         SHIPPING     Shipping address confirmed in Epic.     Delivery Scheduled: Yes, Expected medication delivery date: 3/18.     Medication will be delivered via Same Day Courier to the prescription address in Epic WAM.    Rodney Bender   [facility] Specialty Technician

## 2020-04-14 MED FILL — STELARA 90 MG/ML SUBCUTANEOUS SYRINGE: SUBCUTANEOUS | 30 days supply | Qty: 1 | Fill #5

## 2020-04-19 NOTE — Unmapped (Signed)
Pt pre-called and triaged.

## 2020-04-20 ENCOUNTER — Ambulatory Visit: Admit: 2020-04-20 | Discharge: 2020-04-21 | Payer: MEDICARE

## 2020-04-20 MED ORDER — SIMETHICONE 125 MG CHEWABLE TABLET
ORAL_TABLET | 0 refills | 0 days | Status: CP
Start: 2020-04-20 — End: ?

## 2020-04-20 MED ORDER — PROMETHAZINE 25 MG TABLET
ORAL_TABLET | Freq: Four times a day (QID) | ORAL | 0 refills | 4 days | Status: CP | PRN
Start: 2020-04-20 — End: 2020-04-27

## 2020-04-20 MED ORDER — POLYETHYLENE GLYCOL 3350 17 GRAM/DOSE ORAL POWDER
Freq: Once | ORAL | 0 refills | 1.00000 days | Status: CP
Start: 2020-04-20 — End: 2020-04-20

## 2020-04-20 MED ORDER — DRONABINOL 10 MG CAPSULE
ORAL_CAPSULE | Freq: Two times a day (BID) | ORAL | 0 refills | 30 days | Status: CP
Start: 2020-04-20 — End: ?

## 2020-04-20 MED ORDER — BISACODYL 5 MG TABLET,DELAYED RELEASE
ORAL_TABLET | Freq: Once | ORAL | 0 refills | 1.00000 days | Status: CP
Start: 2020-04-20 — End: 2020-04-20

## 2020-04-20 NOTE — Unmapped (Signed)
[facility] VISIT    REFERRING PROVIDER: Suanne Marker, MD  [address]  [place],  [place] [address]    PRIMARY CARE PROVIDER: Suanne Marker, MD    PATIENT PROFILE: Rodney Bender is a 50 y.o. man who is seen in consultation at the request of Dr. Domingo Madeira for evaluation of Ileal Crohn's disease.    ASSESSMENT AND PLAN:     Rodney Bender is a 50 y.o. man with history significant for ileal stricturing Crohn's disease s/p four ileocecal resections with a long history of adherence issues and fractured care across institutions who presents for routine follow-up.    Ileal stricturing Crohn's disease: I'm concerned he may have inflammatory recurrence of disease, though labs were ok, the sweats and vomiting are not his baseline. CT enterography suggestive of some small bowel inflammation, but colonoscopy is required to confirm active disease. His surgery was 3.5 years ago and he's been on Stellara since then, failed TNF-inhibitors prior to that.  -Expedite outpatient colonoscopy  -Phenergan prn for nausea  -Continue Stellara  -Continue Dronabinol for appetite support    Routine STI screening: patient wants GC/Chlamydia screening, no symptoms, denies any high risk encounters.    Health maintenance in Crohn's disease: has had pneumococcal immunizations, historically declined other immunizations, did not address today with his acute concerns.  - Declines hep a and b vaccine  - Declines influenza  - Declines covid, discussed in detail    Patient Instructions 1.   Call the scheduling number below to arrange an expedited colonoscopy.  2. Continue Dronabinol 10 mg (1 tablet) twice daily.  3. Start Phenergan 25-50 mg (1-2 tablets) every six hours as needed for nausea.  4. Return in about 3 months (around 07/21/2020). (Schedule at the check-out desk or call [phone] to schedule your follow-up.)    -------------------------------------------------------------------------------    If you have any questions or concerns please contact us on MyChart or as below:    For appointments, please call [phone].  For clinical questions use MyChart or call my nurse, Zella Ball, at [phone]  For scheduling radiology exams call [phone].  For scheduling GI procedures (e.g,. Colonoscopy), please call [phone].  For emergencies after hours call [phone] and ask for the GI medicine fellow on call.    ==============================================      Return in about 3 months (around 07/21/2020).    Hunt Oris, MD  Gastroenterology and Hepatology Fellow (PGY-4)  [email or web address] (patient's providers should feel free to reach out with any questions whatsoever)    The patient was seen and discussed with Dr. Marcella Dubs, who agreed with the above assessment and plan.    SUBJECTIVE:     CHIEF COMPLAINT: Follow-up    HISTORY OF PRESENT ILLNESS:      Rodney Bender is a 50 y.o. man with history significant for ileal stricturing Crohn's disease s/p four ileocecal resections with a long history of adherence issues and fractured care across institutions who presents for routine follow-up.    He is getting episodes of abdominal pain, especially at night, accompanied by night sweats, nausea, and vomiting. This seems progressively worsening. He is having bowel movements 4-5 bowel movements per day, loose with mucus, sometimes melena appearing or maybe tinted with red blood. These symptoms seem very consistent with his prior flare or abscess symptoms.    REVIEW OF SYSTEMS:   The balance of 12 systems reviewed is negative except as noted in the history of present illness.  Past Medical History:   Diagnosis Date   ??? Anxiety    ??? Crohn's disease (CMS-HCC)     diagnosed in 1990   ??? GERD (gastroesophageal reflux disease)    ??? Hypertension 10/08/2016     Past Surgical History:   Procedure Laterality Date   ??? COLON SURGERY     ??? PR COLONOSCOPY FLX DX W/COLLJ SPEC WHEN PFRMD Left 11/10/2012    Procedure: COLONOSCOPY, FLEXIBLE, PROXIMAL TO SPLENIC FLEXURE; DIAGNOSTIC, W/WO COLLECTION SPECIMEN BY BRUSH OR WASH;  Surgeon: Malcolm Metro, MD;  [facility];  Service: Gastroenterology   ??? PR COLONOSCOPY FLX DX W/COLLJ SPEC WHEN PFRMD N/A 07/21/2013    Procedure: COLONOSCOPY, FLEXIBLE, PROXIMAL TO SPLENIC FLEXURE; DIAGNOSTIC, W/WO COLLECTION SPECIMEN BY BRUSH OR WASH;  Surgeon: Gwen Pounds, MD;  [facility]: [facility];  Service: Gastroenterology   ??? PR COLONOSCOPY FLX DX W/COLLJ SPEC WHEN PFRMD N/A 08/09/2016    Procedure: COLONOSCOPY, FLEXIBLE, PROXIMAL TO SPLENIC FLEXURE; DIAGNOSTIC, W/WO COLLECTION SPECIMEN BY BRUSH OR WASH;  Surgeon: Janyth Pupa, MD;  Location: [facility];  Service: Gastroenterology   ??? PR COLONOSCOPY FLX DX W/COLLJ SPEC WHEN PFRMD N/A 04/06/2018    Procedure: COLONOSCOPY, FLEXIBLE, PROXIMAL TO SPLENIC FLEXURE; DIAGNOSTIC, W/WO COLLECTION SPECIMEN BY BRUSH OR WASH;  Surgeon: Zetta Bills, MD;  [facility]: [facility];  Service: Gastroenterology   ??? PR COLONOSCOPY W/BIOPSY SINGLE/MULTIPLE  07/23/2012    Procedure: COLONOSCOPY, FLEXIBLE, PROXIMAL TO SPLENIC FLEXURE; WITH BIOPSY, SINGLE OR MULTIPLE;  Surgeon: Vickii Chafe, MD;  Location: [facility];  Service: Gastroenterology   ??? PR COLONOSCOPY W/BIOPSY SINGLE/MULTIPLE N/A 07/15/2014    Procedure: COLONOSCOPY, FLEXIBLE, PROXIMAL TO SPLENIC FLEXURE; WITH BIOPSY, SINGLE OR MULTIPLE;  Surgeon: Janyth Pupa, MD;  Location: [facility];  Service: Gastroenterology   ??? PR COLONOSCOPY W/BIOPSY SINGLE/MULTIPLE N/A 03/21/2017    Procedure: COLONOSCOPY, FLEXIBLE, PROXIMAL TO SPLENIC FLEXURE; WITH BIOPSY, SINGLE OR MULTIPLE;  Surgeon: Modena Nunnery, MD;  Location: [facility];  Service: Gastroenterology   ??? PR COLONOSCOPY W/BIOPSY SINGLE/MULTIPLE N/A 02/12/2019    Procedure: COLONOSCOPY, FLEXIBLE, PROXIMAL TO SPLENIC FLEXURE; WITH BIOPSY, SINGLE OR MULTIPLE;  Surgeon: Jules Husbands, MD;  Location: [facility];  Service: Gastroenterology   ??? PR COLSC FLEXIBLE W/TRANSENDOSCOPIC BALLOON DILAT N/A 07/15/2014    Procedure: COLONOSCOPY, FLEXIBLE; WITH DILATION BY BALLOON, 1 OR MORE STRICTURES;  Surgeon: Janyth Pupa, MD;  [facility]: [facility];  Service: Gastroenterology   ??? PR REMVL COLON & TERM ILEUM W/ILEOCOLOSTOMY N/A 09/16/2016    Procedure: COLECTOMY, PARTIAL, WITH REMOVAL OF TERMINAL ILEUM WITH ILEOCOLOSTOMY;  Surgeon: Lady Gary, MD;  Location: MAIN OR [facility];  Service: Gastrointestinal   ??? PR REPAIR BICEPS LONG TENDON Right 03/19/2019    Procedure: TENODESIS LONG TENDON BICEPS;  Surgeon: Gonzella Lex, MD;  Location: [facility];  Service: Orthopedics   ??? PR SHLDR ARTHROSCOP,PART ACROMIOPLAS Right 10/02/2018    Procedure: R16 ARTHROSCOPY, SHOULDER, SURGICAL; DECOMPRESS SUBACROMIAL SPACE W/PART ACROMIOPLASTY, Tamala Bari;  Surgeon: Gonzella Lex, MD;  Location: ASC OR [facility];  Service: Orthopedics   ??? PR SHLDR ARTHROSCOP,SURG,W/ROTAT CUFF REPR Right 10/02/2018    Procedure: ARTHROSCOPY, SHOULDER, SURGICAL; WITH ROTATOR CUFF REPAIR;  Surgeon: Gonzella Lex, MD;  Location: ASC OR [facility];  Service: Orthopedics   ??? PR SHLDR ARTHROSCOP,SURG,W/ROTAT CUFF REPR Right 03/19/2019    Procedure: R16 ARTHROSCOPY, SHOULDER, SURGICAL; WITH ROTATOR  CUFF REPAIR;  Surgeon: Gonzella Lex, MD;  Location: ASC OR [facility];  Service: Orthopedics   ??? PR UPPER GI ENDOSCOPY,BIOPSY N/A 07/23/2012    Procedure: UGI ENDOSCOPY; WITH BIOPSY, SINGLE OR MULTIPLE;  Surgeon: Vickii Chafe, MD;  [facility]: [facility];  Service: Gastroenterology   ??? PR UPPER GI ENDOSCOPY,DIAGNOSIS N/A 11/10/2012    Procedure: UGI ENDO, INCLUDE ESOPHAGUS, STOMACH, & DUODENUM &/OR JEJUNUM; DX W/WO COLLECTION SPECIMN, BY BRUSH OR WASH;  Surgeon: Malcolm Metro, MD;  [facility]: [facility];  Service: Gastroenterology   ??? PR UPPER GI ENDOSCOPY,DIAGNOSIS N/A 07/21/2013    Procedure: UGI ENDO, INCLUDE ESOPHAGUS, STOMACH, & DUODENUM &/OR JEJUNUM; DX W/WO COLLECTION SPECIMN, BY BRUSH OR WASH;  Surgeon: Gwen Pounds, MD;  [facility];  Service: Gastroenterology   ??? PR UPPER GI ENDOSCOPY,DIAGNOSIS N/A 07/15/2014    Procedure: UGI ENDO, INCLUDE ESOPHAGUS, STOMACH, & DUODENUM &/OR JEJUNUM; DX W/WO COLLECTION SPECIMN, BY BRUSH OR WASH;  Surgeon: Janyth Pupa, MD;  [facility]: [facility];  Service: Gastroenterology   ??? ROTATOR CUFF REPAIR Right 2020   ??? SHOULDER SURGERY     ??? SPINE SURGERY       Current Outpatient Medications   Medication Sig Dispense Refill   ??? citalopram (CELEXA) 10 MG tablet Take 10 mg by mouth daily as needed.     ??? dronabinoL (MARINOL) 10 MG capsule Take 1 capsule (10 mg total) by mouth Two (2) times a day (30 minutes before a meal). 60 capsule 0   ??? gabapentin (NEURONTIN) 300 MG capsule Take 1 capsule (300 mg total) by mouth Three (3) times a day. 90 capsule 4   ??? loperamide (IMODIUM A-D) 2 mg tablet Take 2 mg by mouth.     ??? 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 oxyCODONE-acetaminophen (PERCOCET) 10-325 mg per tablet Take 1 tablet by mouth every eight (8) hours as needed.     ??? sildenafil (VIAGRA) 50 MG tablet Take 50 mg by mouth daily as needed for erectile dysfunction.     ??? therapeutic multivitamin (THERAGRAN) tablet Take 1 tablet by mouth daily.      ??? ustekinumab (STELARA) 90 mg/mL Syrg syringe Inject the contents of 1 syringe (90 mg total) under the skin every 8 weeks. 1 mL 5   ??? bisacodyL (DULCOLAX) 5 mg EC tablet Take 2 tablets (10 mg total) by mouth once for 1 dose. Take at 6:00 pm 2 days before your procedure. 2 tablet 0   ??? polyethylene glycol (MIRALAX) 17 gram/dose powder Take 238 g by mouth once for 1 dose. Starting at 5:00 pm the day before your procedure, mix the 8.3-ounce (238 g) bottle of polyethylene glycol oral powder (Miralax) with 64 ounces of your Gatorade.  Drink one 8-ounce glass every 15-20 minutes until you have finished the 64 ounces. 238 g 0   ??? polyethylene glycol (MIRALAX) 17 gram/dose powder Take 119 g by mouth once for 1 dose. Starting at least 4 hours before your procedure time, mix the 4.1-ounce (119 g) bottle of polyethylene glycol oral powder (Miralax) with 32 ounces of Gatorade. Mix well. Drink one 8-ounce glass every 15-20 minutes. Finish it NO LATER than 2 hours before your procedure time. 119 g 0   ??? promethazine (PHENERGAN) 25 MG tablet Take 1-2 tablets (25-50 mg total) by mouth every six (6) hours as needed for nausea for up to  7 days. 30 tablet 0   ??? simethicone (MYLICON) 125 MG chewable tablet Take 2 tablets (250 mg) at 5:00 pm the day before your procedure. Take remaining 2 tablets (250 mg) at least 4 hours before your procedure time. 4 tablet 0     No current facility-administered medications for this visit.     Allergies  Reviewed on 04/19/2020      Reactions Comment    Tramadol Itching, Anxiety Other reaction(s): Other (See Comments)    Penicillins  Other reaction(s): Itching-Allergy    Dilaudid [hydromorphone] Anxiety     Morphine Itching         Family History   Problem Relation Age of Onset   ??? Hyperlipidemia Father    ??? Cancer Father         kidney   ??? Cancer Maternal Aunt    ??? Stroke Mother    ??? No Known Problems Sister    ??? No Known Problems Brother    ??? No Known Problems Maternal Uncle    ??? No Known Problems Paternal Aunt    ??? No Known Problems Paternal Uncle    ??? No Known Problems Maternal Grandmother    ??? No Known Problems Maternal Grandfather    ??? No Known Problems Paternal Grandmother    ??? No Known Problems Paternal Grandfather    ??? Anesthesia problems Neg Hx    ??? Broken bones Neg Hx    ??? Clotting disorder Neg Hx    ??? Collagen disease Neg Hx    ??? Diabetes Neg Hx    ??? Dislocations Neg Hx    ??? Fibromyalgia Neg Hx    ??? Gout Neg Hx    ??? Hemophilia Neg Hx    ??? Osteoporosis Neg Hx    ??? Rheumatologic disease Neg Hx    ??? Scoliosis Neg Hx    ??? Severe sprains Neg Hx    ??? Sickle cell anemia Neg Hx    ??? Spinal Compression Fracture Neg Hx      Social History     Tobacco Use   ??? Smoking status: Former Smoker     Packs/day: 1.00     Years: 18.00     Pack years: 18.00     Types: Cigarettes     Start date: 08/27/2003     Quit date: 06/06/2017     Years since quitting: 2.8   ??? Smokeless tobacco: Never Used   ??? Tobacco comment: Pt smokes 1ppd, Pt is interested in tobacco cessation    Vaping Use   ??? Vaping Use: Never used   Substance Use Topics   ??? Alcohol use: No   ??? Drug use: Not Currently     Types: Marijuana       OBJECTIVE:   VITAL SIGNS: BP 110/74  - Pulse 61  - Ht 180.3 cm (5' 11)  - Wt 62.1 kg (137 lb)  - BMI 19.11 kg/m??     Wt Readings from Last 6 Encounters:   04/20/20 62.1 kg (137 lb)   04/06/20 63.5 kg (140 lb)   03/10/20 63.4 kg (139 lb 12.8 oz)   08/11/19 66.7 kg (147 lb)   05/11/19 68 kg (150 lb)   03/19/19 63 kg (138 lb 14.2 oz)       PHYSICAL EXAM:  General appearance: Appears well, no distress. Very thin.  Eyes: Anicteric sclera. No erythema.  ENT: No oral ulcers. Posterior oropharynx unremarkable.  Cardiovascular: RRR without murmurs, heaves, or thrills.  No lower extremity edema.  Pulmonary: Normal work of breathing. Acyanotic.  Abdominal: soft, mild epigastric tenderness at baseline, nondistended, no masses or organomegaly.  Musculoskeletal: No temporal wasting. Normal joints of the hand.  Skin: No jaundice. No rashes.  Neurologic: Alert, oriented, and appropriate.  Psychiatric: Appropriate.

## 2020-04-20 NOTE — Unmapped (Addendum)
1. Call the scheduling number below to arrange an expedited colonoscopy.  2. Continue Dronabinol 10 mg (1 tablet) twice daily.  3. Start Phenergan 25-50 mg (1-2 tablets) every six hours as needed for nausea.  4. Return in about 3 months (around 07/21/2020). (Schedule at the check-out desk or call [phone] to schedule your follow-up.)    -------------------------------------------------------------------------------    If you have any questions or concerns please contact us on MyChart or as below:    For appointments, please call [phone].  For clinical questions use MyChart or call my nurse, Zella Ball, at [phone]  For scheduling radiology exams call [phone].  For scheduling GI procedures (e.g,. Colonoscopy), please call [phone].  For emergencies after hours call [phone] and ask for the GI medicine fellow on call.    ==============================================

## 2020-04-24 NOTE — Unmapped (Signed)
Complex Case Management   Final Call- Program Completion  Summary:   Case Manager spoke with patient today to resolve Complex Case Management services. Patient currently resides at Home.    Patient/caregiver reported he is suppose to have a colonoscopy in a few weeks. CM looked back and patient is scheduled for 05/15/20. Patient has not been given a time. Patient also stated that he is suppose to get his prep via [employer]. CM gave patient number to scheduled office, for follow up ([facility]). CM did discuss with patient the importance of safe sex and use of protection. Patient voiced understanding. Patient never followed up with RD. RD tried outreach 3 times with no return call after letter. CM discussed with patient importance of follow up with specialty and PCP. Patient voiced understanding.    How would you rate your overall health right now?  (Scale of 1-5) 3- Good     Health Literacy: How confident are you that you understand your health issues/concerns, can participate in your care, and manage your care along with your physician (Scale of 1-3): 3- Confident    Plan:   Provided PCP office number and after-hours nurse line information for future care needs     Care Coordination Note updated in [facility]: Yes    Pt Care Coordination Note:  This patient completed Complex Case Management services on 04/24/2020.     The following barriers were addressed: Health Literacy    The following interventions were provided: Supportive Listening     If this patient develops new chronic conditions, new opportunities to resolve barriers, or other areas of need, please send an AMB Referral to Case Management to the Personal Health Advocate Department.

## 2020-05-04 NOTE — Unmapped (Signed)
I saw and evaluated the patient, participating in the key portions of the service.  I reviewed the resident’s note.  I agree with the resident’s findings and plan. Aleta Manternach B Sula Fetterly, MD

## 2020-05-07 DIAGNOSIS — K5000 Crohn's disease of small intestine without complications: Principal | ICD-10-CM

## 2020-05-07 MED ORDER — STELARA 90 MG/ML SUBCUTANEOUS SYRINGE
SUBCUTANEOUS | 5 refills | 56.00000 days
Start: 2020-05-07 — End: ?

## 2020-05-08 MED ORDER — BISACODYL 5 MG TABLET,DELAYED RELEASE
ORAL_TABLET | Freq: Once | ORAL | 0 refills | 1 days | Status: CP
Start: 2020-05-08 — End: 2020-05-08

## 2020-05-08 MED ORDER — SIMETHICONE 125 MG CHEWABLE TABLET
ORAL_TABLET | 0 refills | 0 days | Status: CP
Start: 2020-05-08 — End: ?
  Filled 2020-05-12: qty 4, 2d supply, fill #0

## 2020-05-08 MED ORDER — POLYETHYLENE GLYCOL 3350 17 GRAM/DOSE ORAL POWDER
Freq: Once | ORAL | 0 refills | 1.00000 days | Status: CP
Start: 2020-05-08 — End: 2020-05-08
  Filled 2020-05-12: qty 238, 1d supply, fill #0

## 2020-05-10 DIAGNOSIS — K5000 Crohn's disease of small intestine without complications: Principal | ICD-10-CM

## 2020-05-10 MED ORDER — STELARA 90 MG/ML SUBCUTANEOUS SYRINGE
SUBCUTANEOUS | 5 refills | 56.00000 days
Start: 2020-05-10 — End: ?

## 2020-05-11 ENCOUNTER — Encounter: Payer: Self-pay | Admitting: Anesthesiology

## 2020-05-11 ENCOUNTER — Ambulatory Visit: Payer: Medicare Other | Attending: Anesthesiology | Admitting: Anesthesiology

## 2020-05-11 ENCOUNTER — Other Ambulatory Visit: Payer: Self-pay

## 2020-05-11 DIAGNOSIS — M47816 Spondylosis without myelopathy or radiculopathy, lumbar region: Secondary | ICD-10-CM

## 2020-05-11 DIAGNOSIS — F1190 Opioid use, unspecified, uncomplicated: Secondary | ICD-10-CM

## 2020-05-11 DIAGNOSIS — M5020 Other cervical disc displacement, unspecified cervical region: Secondary | ICD-10-CM

## 2020-05-11 DIAGNOSIS — G894 Chronic pain syndrome: Secondary | ICD-10-CM

## 2020-05-11 DIAGNOSIS — M549 Dorsalgia, unspecified: Secondary | ICD-10-CM

## 2020-05-11 DIAGNOSIS — M5412 Radiculopathy, cervical region: Secondary | ICD-10-CM

## 2020-05-11 DIAGNOSIS — G8929 Other chronic pain: Secondary | ICD-10-CM

## 2020-05-11 DIAGNOSIS — M542 Cervicalgia: Secondary | ICD-10-CM

## 2020-05-11 DIAGNOSIS — R109 Unspecified abdominal pain: Secondary | ICD-10-CM

## 2020-05-11 DIAGNOSIS — K5090 Crohn's disease, unspecified, without complications: Secondary | ICD-10-CM

## 2020-05-11 MED ORDER — OXYCODONE-ACETAMINOPHEN 10-325 MG PO TABS: 1.0000 | ORAL_TABLET | Freq: Three times a day (TID) | ORAL | 0 refills | Status: AC | PRN

## 2020-05-11 MED ORDER — OXYCODONE-ACETAMINOPHEN 10-325 MG PO TABS: 1.0000 | ORAL_TABLET | Freq: Three times a day (TID) | ORAL | 0 refills | Status: DC | PRN

## 2020-05-11 NOTE — Progress Notes (Signed)
Virtual Visit via Telephone Note  I connected with Benjamin Murillo on 05/11/20 at 10:00 AM EDT by telephone and verified that I am speaking with the correct person using two identifiers.  Location: Patient: Home Provider: [place]   I discussed the limitations, risks, security and privacy concerns of performing an evaluation and management service by telephone and the availability of in person appointments. I also discussed with the patient that there may be a patient responsible charge related to this service. The patient expressed understanding and agreed to proceed.   History of Present Illness: Spoke with Benjamin Murillo today via telephone as we were unable to link for the video portion of the virtual conference and he reports that he has been doing reasonably well with his neck and low back pain.  He is taking his oxycodone with acetaminophen 2 or 3 times a day to keep his pain under control and this also helps control his chronic abdominal pain.  He reports that the quality characteristic and distribution of his pain are stable without recent change except for the abdominal pain.  He is scheduled for a colonoscopy within the next few weeks secondary to a mild to moderate exacerbation of abdominal pain per his GI doctors.  Otherwise he is doing well with his medication management with no side effects reported today.  He continues to describe improved productivity and lifestyle function with the medications as opposed to nonopioid medications which have been ineffective for him in the past.  Otherwise he is doing well with no change in lower or upper extremity strength or function.   Observations/Objective:  Current Outpatient Medications:  .  [START ON 06/12/2020] oxyCODONE-acetaminophen (PERCOCET) 10-325 MG tablet, Take 1 tablet by mouth every 8 (eight) hours as needed for pain., Disp: 75 tablet, Rfl: 0 .  amlodipine-atorvastatin (CADUET) 10-10 MG tablet, Take 1 tablet by mouth daily., Disp:  , Rfl:  .  citalopram (CELEXA) 10 MG tablet, Take 10 mg by mouth daily as needed., Disp: , Rfl:  .  dronabinol (MARINOL) 10 MG capsule, Take 10 mg by mouth 2 (two) times daily before a meal., Disp: , Rfl:  .  famotidine (PEPCID) 20 MG tablet, Take 20 mg by mouth 2 (two) times daily., Disp: , Rfl:  .  gabapentin (NEURONTIN) 100 MG capsule, Take 1 capsule (100 mg total) by mouth at bedtime., Disp: 30 capsule, Rfl: 3 .  gabapentin (NEURONTIN) 300 MG capsule, Take 300 mg by mouth daily., Disp: , Rfl:  .  hyoscyamine (LEVSIN, ANASPAZ) 0.125 MG tablet, Take 0.125 mg by mouth as needed. , Disp: , Rfl:  .  loperamide (IMODIUM A-D) 2 MG tablet, Take 2 mg by mouth as needed., Disp: , Rfl:  .  naloxone (NARCAN) nasal spray 4 mg/0.1 mL, For respiratory depression from opioids, Disp: 1 kit, Rfl: 2 .  [START ON 05/13/2020] oxyCODONE-acetaminophen (PERCOCET) 10-325 MG tablet, Take 1 tablet by mouth every 8 (eight) hours as needed for pain., Disp: 75 tablet, Rfl: 0 .  prochlorperazine (COMPAZINE) 5 MG tablet, Take 5 mg by mouth every 8 (eight) hours as needed., Disp: , Rfl:  .  STELARA 90 MG/ML SOSY injection, Inject 90 mg as directed., Disp: , Rfl:   Assessment and Plan: 1. Chronic pain syndrome   2. Chronic, continuous use of opioids   3. Crohn's disease without complication, unspecified gastrointestinal tract location ([place])   4. Cervicalgia   5. Chronic neck and back pain   6. Facet arthritis of lumbar region  7. Chronic abdominal pain   8. Cervical radiculitis   9. HNP (herniated nucleus pulposus), cervical   Based on our discussion today and upon review of the [facility] practitioner base information where to refill his medications for the next 2 months.  This will be for Percocet 10/31/2023 to be taken twice daily to 3 times daily dosing as needed.  Is to be scheduled for April 16 and May 16.  I have reviewed his most recent urine screen.  It is positive for THC and he is on Marinol and this is  recorded.  No other changes are to be made in his pain management protocol.  I have encouraged him to continue follow-up with his GI physicians for baseline management and primary care physicians as well with return to clinic scheduled in 2 months.  Follow Up Instructions:    I discussed the assessment and treatment plan with the patient. The patient was provided an opportunity to ask questions and all were answered. The patient agreed with the plan and demonstrated an understanding of the instructions.   The patient was advised to call back or seek an in-person evaluation if the symptoms worsen or if the condition fails to improve as anticipated.  I provided 30 minutes of non-face-to-face time during this encounter.   [name], MD Home

## 2020-05-12 DIAGNOSIS — K5000 Crohn's disease of small intestine without complications: Principal | ICD-10-CM

## 2020-05-12 MED FILL — CLEARLAX 17 GRAM/DOSE ORAL POWDER: ORAL | 1 days supply | Qty: 119 | Fill #0

## 2020-05-12 MED FILL — BISACODYL 5 MG TABLET,DELAYED RELEASE: ORAL | 1 days supply | Qty: 2 | Fill #0

## 2020-05-15 ENCOUNTER — Ambulatory Visit: Admit: 2020-05-15 | Discharge: 2020-05-15 | Payer: MEDICARE

## 2020-05-15 ENCOUNTER — Encounter: Admit: 2020-05-15 | Discharge: 2020-05-15 | Payer: MEDICARE

## 2020-05-15 MED ADMIN — propofoL (DIPRIVAN) injection: INTRAVENOUS | @ 18:00:00 | Stop: 2020-05-15

## 2020-05-15 MED ADMIN — propofol (DIPRIVAN) infusion 10 mg/mL: INTRAVENOUS | @ 18:00:00 | Stop: 2020-05-15

## 2020-05-15 MED ADMIN — propofoL (DIPRIVAN) injection: INTRAVENOUS | @ 19:00:00 | Stop: 2020-05-15

## 2020-05-16 DIAGNOSIS — K5000 Crohn's disease of small intestine without complications: Principal | ICD-10-CM

## 2020-05-18 DIAGNOSIS — K5000 Crohn's disease of small intestine without complications: Principal | ICD-10-CM

## 2020-05-22 NOTE — Unmapped (Signed)
[facility] Clinical Assessment & Refill Coordination Note    Rodney Bender [place], [place]: 01-12-1971  Phone: [phone] (home)     All above HIPAA information was verified with patient.     Was a Nurse, learning disability used for this call? No    Specialty Medication(s):   Inflammatory Disorders: Stelara     Current Outpatient Medications   Medication Sig Dispense Refill   ??? citalopram (CELEXA) 10 MG tablet Take 10 mg by mouth daily as needed.     ??? dronabinoL (MARINOL) 10 MG capsule Take 1 capsule (10 mg total) by mouth Two (2) times a day (30 minutes before a meal). 60 capsule 0   ??? gabapentin (NEURONTIN) 300 MG capsule Take 1 capsule (300 mg total) by mouth Three (3) times a day. 90 capsule 4   ??? loperamide (IMODIUM A-D) 2 mg tablet Take 2 mg by mouth.     ??? 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 oxyCODONE-acetaminophen (PERCOCET) 10-325 mg per tablet Take 1 tablet by mouth every eight (8) hours as needed.     ??? sildenafil (VIAGRA) 50 MG tablet Take 50 mg by mouth daily as needed for erectile dysfunction.     ??? simethicone (MYLICON) 125 MG chewable tablet Take 2 tablets (250 mg) at 5:00 pm the day before your procedure. Take remaining 2 tablets (250 mg) at least 4 hours before your procedure time. 4 tablet 0   ??? therapeutic multivitamin (THERAGRAN) tablet Take 1 tablet by mouth daily.      ??? ustekinumab (STELARA) 90 mg/mL Syrg syringe Inject the contents of 1 syringe (90 mg total) under the skin every 8 weeks. 1 mL 5     No current facility-administered medications for this visit.        Changes to medications: Dillyn reports no changes at this time.    Allergies   Allergen Reactions   ??? Tramadol Itching and Anxiety     Other reaction(s): Other (See Comments)   ??? Penicillins      Other reaction(s): Itching-Allergy   ??? Dilaudid [Hydromorphone] Anxiety   ??? Morphine Itching       Changes to allergies: No    SPECIALTY MEDICATION ADHERENCE     Stelara 90 mg/ml: 0 days of medicine on hand       Medication Adherence    Patient reported X missed doses in the last month: 0  Specialty Medication: Stelara  Patient is on additional specialty medications: No  Informant: patient          Specialty medication(s) dose(s) confirmed: Regimen is correct and unchanged.     Are there any concerns with adherence? No    Adherence counseling provided? Not needed    CLINICAL MANAGEMENT AND INTERVENTION      Clinical Benefit Assessment:    Do you feel the medicine is effective or helping your condition? Yes    Clinical Benefit counseling provided? Not needed    Adverse Effects Assessment:    Are you experiencing any side effects? No    Are you experiencing difficulty administering your medicine? No    Quality of Life Assessment:    How many days over the past month did your Crohn's  keep you from your normal activities? For example, brushing your teeth or getting up in the morning. ~14 days    Have you discussed this with your provider? Yes    Acute Infection Status:  Acute infections noted within Epic:  No active infections  Patient reported infection: None    Therapy Appropriateness:    Is therapy appropriate? Yes, therapy is appropriate and should be continued    DISEASE/MEDICATION-SPECIFIC INFORMATION      For patients on injectable medications: Patient currently has 0 doses left.  Next injection is scheduled for 05/24/20.    PATIENT SPECIFIC NEEDS     - Does the patient have any physical, cognitive, or cultural barriers? No    - Is the patient high risk? No    - Does the patient require a Care Management Plan? No     - Does the patient require physician intervention or other additional services (i.e. nutrition, smoking cessation, social work)? No      SHIPPING     Specialty Medication(s) to be Shipped:   Inflammatory Disorders: Stelara    Other medication(s) to be shipped: No additional medications requested for fill at this time     Changes to insurance: No    Delivery Scheduled: Yes, Expected medication delivery date: 05/23/20.     Medication will be delivered via Same Day Courier to the confirmed prescription address in [facility]    The patient will receive a drug information handout for each medication shipped and additional FDA Medication Guides as required.  Verified that patient has previously received a Conservation officer, historic buildings and a Surveyor, mining.    All of the patient's questions and concerns have been addressed.    Arnold Long   [facility] Specialty Pharmacist

## 2020-05-23 MED FILL — STELARA 90 MG/ML SUBCUTANEOUS SYRINGE: SUBCUTANEOUS | 56 days supply | Qty: 1 | Fill #0

## 2020-05-24 MED ORDER — DRONABINOL 10 MG CAPSULE
ORAL_CAPSULE | 0 refills | 0 days | Status: CP
Start: 2020-05-24 — End: ?

## 2020-05-31 MED ORDER — PROMETHAZINE 25 MG TABLET
ORAL_TABLET | 0 refills | 0 days | Status: CP
Start: 2020-05-31 — End: ?

## 2020-06-24 MED ORDER — DRONABINOL 10 MG CAPSULE
ORAL_CAPSULE | 0 refills | 0 days
Start: 2020-06-24 — End: ?

## 2020-06-29 MED ORDER — PROMETHAZINE 25 MG TABLET
ORAL_TABLET | 0 refills | 0 days
Start: 2020-06-29 — End: ?

## 2020-07-06 NOTE — Unmapped (Signed)
[facility] Refill Coordination Note    Specialty Medication(s) to be Shipped:   Inflammatory Disorders: Stelara    Other medication(s) to be shipped: No additional medications requested for fill at this time     Rodney Bender, DOB: May 25, 1970  Phone: [phone] (home)       All above HIPAA information was verified with patient.     Was a Nurse, learning disability used for this call? No    Completed refill call assessment today to schedule patient's medication shipment from the [facility] [phone]).  All relevant notes have been reviewed.     Specialty medication(s) and dose(s) confirmed: Regimen is correct and unchanged.   Changes to medications: [name] reports no changes at this time.  Changes to insurance: No  New side effects reported not previously addressed with a pharmacist or physician: None reported  Questions for the pharmacist: No    Confirmed patient received a Conservation officer, historic buildings and a Surveyor, mining with first shipment. The patient will receive a drug information handout for each medication shipped and additional FDA Medication Guides as required.       DISEASE/MEDICATION-SPECIFIC INFORMATION        For patients on injectable medications: Patient currently has 0 doses left.  Next injection is scheduled for 07/19/20.    SPECIALTY MEDICATION ADHERENCE     Medication Adherence    Patient reported X missed doses in the last month: 0  Specialty Medication: Stelara  Patient is on additional specialty medications: No  Patient is on more than two specialty medications: No  Any gaps in refill history greater than 2 weeks in the last 3 months: no  Demonstrates understanding of importance of adherence: yes  Informant: patient  Reliability of informant: reliable  Confirmed plan for next specialty medication refill: delivery by pharmacy  Refills needed for supportive medications: not needed              Were doses missed due to medication being on hold? No    STELARA 90 mg/mL: 0 days of medicine on hand      REFERRAL TO PHARMACIST     Referral to the pharmacist: Not needed      [facility]     Shipping address confirmed in Epic.     Delivery Scheduled: Yes, Expected medication delivery date: 07/14/20.     Medication will be delivered via Same Day Courier to the prescription address in Epic [facility].    [name]   [facility] Specialty Technician

## 2020-07-10 ENCOUNTER — Telehealth: Payer: Self-pay | Admitting: Anesthesiology

## 2020-07-10 NOTE — Telephone Encounter (Signed)
IF he doesn't have an appointment with Dr Andree Elk, please make him a virtual appointment to discuss.

## 2020-07-12 ENCOUNTER — Encounter: Payer: Self-pay | Admitting: Anesthesiology

## 2020-07-12 ENCOUNTER — Other Ambulatory Visit: Payer: Self-pay

## 2020-07-12 ENCOUNTER — Ambulatory Visit: Payer: Medicare Other | Attending: Anesthesiology | Admitting: Anesthesiology

## 2020-07-12 DIAGNOSIS — G8929 Other chronic pain: Secondary | ICD-10-CM

## 2020-07-12 DIAGNOSIS — G894 Chronic pain syndrome: Secondary | ICD-10-CM

## 2020-07-12 DIAGNOSIS — M542 Cervicalgia: Secondary | ICD-10-CM | POA: Diagnosis not present

## 2020-07-12 DIAGNOSIS — M5412 Radiculopathy, cervical region: Secondary | ICD-10-CM

## 2020-07-12 DIAGNOSIS — F1190 Opioid use, unspecified, uncomplicated: Secondary | ICD-10-CM

## 2020-07-12 DIAGNOSIS — M5020 Other cervical disc displacement, unspecified cervical region: Secondary | ICD-10-CM

## 2020-07-12 DIAGNOSIS — M47816 Spondylosis without myelopathy or radiculopathy, lumbar region: Secondary | ICD-10-CM

## 2020-07-12 DIAGNOSIS — K5090 Crohn's disease, unspecified, without complications: Secondary | ICD-10-CM

## 2020-07-12 DIAGNOSIS — R109 Unspecified abdominal pain: Secondary | ICD-10-CM

## 2020-07-12 DIAGNOSIS — M549 Dorsalgia, unspecified: Secondary | ICD-10-CM

## 2020-07-12 MED ORDER — OXYCODONE-ACETAMINOPHEN 10-325 MG PO TABS: 1.0000 | ORAL_TABLET | Freq: Three times a day (TID) | ORAL | 0 refills | Status: DC | PRN

## 2020-07-12 MED ORDER — OXYCODONE-ACETAMINOPHEN 10-325 MG PO TABS: 1.0000 | ORAL_TABLET | Freq: Three times a day (TID) | ORAL | 0 refills | Status: AC | PRN

## 2020-07-14 MED FILL — STELARA 90 MG/ML SUBCUTANEOUS SYRINGE: SUBCUTANEOUS | 56 days supply | Qty: 1 | Fill #1

## 2020-07-17 NOTE — Progress Notes (Signed)
Virtual Visit via Telephone Note  I connected with Benjamin Murillo on 07/17/20 at 10:45 AM EDT by telephone and verified that I am speaking with the correct person using two identifiers.  Location: Patient: Home Provider: Pain control center   I discussed the limitations, risks, security and privacy concerns of performing an evaluation and management service by telephone and the availability of in person appointments. I also discussed with the patient that there may be a patient responsible charge related to this service. The patient expressed understanding and agreed to proceed.   History of Present Illness:  I spoke with Benjamin Murillo today via telephone as we were unable to link with the video portion of the virtual conference.  He reports that he is been doing reasonably well with his abdominal pain and neck pain.  Quality characteristic and distribution of these have been stable.  He has been following with GI secondary to some recent problems with his bowel function.  This is likely viral but has since stabilized.  He still taking his medications as prescribed and these continue to give him good relief when he is taking them.  Otherwise no significant changes are reported in the quality characteristic or distribution of his neck pain low back pain or abdominal pain.  He takes his medications as prescribed and generally gets about 50 to 75% relief lasting about 6 to 8 hours.  He averages this 2-3 times a day.  No side effects are noted.  Review of systems: General: No fevers chills nausea GI: No constipation or diarrhea at present. Pulmonary: No shortness of breath or dyspnea Cardiac: No angina or palpitations Observations/Objective:  Current Outpatient Medications:    [START ON 08/11/2020] oxyCODONE-acetaminophen (PERCOCET) 10-325 MG tablet, Take 1 tablet by mouth every 8 (eight) hours as needed for pain., Disp: 75 tablet, Rfl: 0   amlodipine-atorvastatin (CADUET) 10-10 MG tablet, Take  1 tablet by mouth daily., Disp: , Rfl:    citalopram (CELEXA) 10 MG tablet, Take 10 mg by mouth daily as needed., Disp: , Rfl:    dronabinol (MARINOL) 10 MG capsule, Take 10 mg by mouth 2 (two) times daily before a meal., Disp: , Rfl:    famotidine (PEPCID) 20 MG tablet, Take 20 mg by mouth 2 (two) times daily., Disp: , Rfl:    gabapentin (NEURONTIN) 100 MG capsule, Take 1 capsule (100 mg total) by mouth at bedtime., Disp: 30 capsule, Rfl: 3   gabapentin (NEURONTIN) 300 MG capsule, Take 300 mg by mouth daily., Disp: , Rfl:    hyoscyamine (LEVSIN, ANASPAZ) 0.125 MG tablet, Take 0.125 mg by mouth as needed. , Disp: , Rfl:    loperamide (IMODIUM A-D) 2 MG tablet, Take 2 mg by mouth as needed., Disp: , Rfl:    naloxone (NARCAN) nasal spray 4 mg/0.1 mL, For respiratory depression from opioids, Disp: 1 kit, Rfl: 2   oxyCODONE-acetaminophen (PERCOCET) 10-325 MG tablet, Take 1 tablet by mouth every 8 (eight) hours as needed for pain., Disp: 75 tablet, Rfl: 0   prochlorperazine (COMPAZINE) 5 MG tablet, Take 5 mg by mouth every 8 (eight) hours as needed., Disp: , Rfl:    STELARA 90 MG/ML SOSY injection, Inject 90 mg as directed., Disp: , Rfl:    Assessment and Plan: 1. Chronic pain syndrome   2. Chronic, continuous use of opioids   3. Crohn's disease without complication, unspecified gastrointestinal tract location ([place])   4. Cervicalgia   5. Chronic neck and back pain   6.  Facet arthritis of lumbar region   7. Chronic abdominal pain   8. Cervical radiculitis   9. HNP (herniated nucleus pulposus), cervical   Based on her discussion today I think is appropriate to refill his medications.  I have reviewed the [facility] practitioner database information is appropriate for refill based on this with scheduled refills for June 15 and July 15.  No changes will be made in his medication management.  I want him to continue with stretching and strengthening exercises as reviewed for his neck and back and  continue follow-up with his GI team for his abdominal pain.  We will continue his medication management for this as well.  He seems to be doing well with this regimen with no side effects reported.  Scheduled for follow-up in 2 months.  Follow Up Instructions:    I discussed the assessment and treatment plan with the patient. The patient was provided an opportunity to ask questions and all were answered. The patient agreed with the plan and demonstrated an understanding of the instructions.   The patient was advised to call back or seek an in-person evaluation if the symptoms worsen or if the condition fails to improve as anticipated.  I provided 30 minutes of non-face-to-face time during this encounter.   [name], MD

## 2020-08-09 MED ORDER — DRONABINOL 10 MG CAPSULE
ORAL_CAPSULE | 0 refills | 0 days | Status: CP
Start: 2020-08-09 — End: ?

## 2020-08-15 MED ORDER — PROMETHAZINE 25 MG TABLET
ORAL_TABLET | 0 refills | 0 days
Start: 2020-08-15 — End: ?

## 2020-08-16 MED ORDER — PROMETHAZINE 25 MG TABLET
ORAL_TABLET | Freq: Four times a day (QID) | ORAL | 2 refills | 8.00000 days | Status: CP | PRN
Start: 2020-08-16 — End: ?

## 2020-08-31 NOTE — Unmapped (Signed)
[facility] Refill Coordination Note    Specialty Medication(s) to be Shipped:   Inflammatory Disorders: Stelara    Other medication(s) to be shipped: No additional medications requested for fill at this time     Rodney Bender, DOB: 19-Jan-1971  Phone: [phone] (home)       All above HIPAA information was verified with patient.     Was a Nurse, learning disability used for this call? No    Completed refill call assessment today to schedule patient's medication shipment from the [facility] [phone]).  All relevant notes have been reviewed.     Specialty medication(s) and dose(s) confirmed: Regimen is correct and unchanged.   Changes to medications: Harun reports no changes at this time.  Changes to insurance: No  New side effects reported not previously addressed with a pharmacist or physician: None reported  Questions for the pharmacist: No    Confirmed patient received a Conservation officer, historic buildings and a Surveyor, mining with first shipment. The patient will receive a drug information handout for each medication shipped and additional FDA Medication Guides as required.       DISEASE/MEDICATION-SPECIFIC INFORMATION        For patients on injectable medications: Patient currently has 0 doses left.  Next injection is scheduled for 09/13/20.    SPECIALTY MEDICATION ADHERENCE     Medication Adherence    Patient reported X missed doses in the last month: 0  Specialty Medication: STELARA 90 mg/mL  Patient is on additional specialty medications: No  Patient is on more than two specialty medications: No  Any gaps in refill history greater than 2 weeks in the last 3 months: no  Demonstrates understanding of importance of adherence: yes  Informant: patient  Reliability of informant: reliable  Confirmed plan for next specialty medication refill: delivery by pharmacy  Refills needed for supportive medications: not needed              Were doses missed due to medication being on hold? No    STELARA 90 mg/mL: 0 days of medicine on hand       REFERRAL TO PHARMACIST     Referral to the pharmacist: Not needed      [facility]     Shipping address confirmed in Epic.     Delivery Scheduled: Yes, Expected medication delivery date: 09/11/20.     Medication will be delivered via Same Day Courier to the prescription address in Epic [facility].    [name]   [facility] Specialty Technician

## 2020-09-08 ENCOUNTER — Ambulatory Visit: Payer: Medicare Other | Attending: Anesthesiology | Admitting: Anesthesiology

## 2020-09-08 ENCOUNTER — Encounter: Payer: Self-pay | Admitting: Anesthesiology

## 2020-09-08 ENCOUNTER — Other Ambulatory Visit: Payer: Self-pay

## 2020-09-08 DIAGNOSIS — M5412 Radiculopathy, cervical region: Secondary | ICD-10-CM

## 2020-09-08 DIAGNOSIS — M542 Cervicalgia: Secondary | ICD-10-CM

## 2020-09-08 DIAGNOSIS — F1190 Opioid use, unspecified, uncomplicated: Secondary | ICD-10-CM

## 2020-09-08 DIAGNOSIS — G8929 Other chronic pain: Secondary | ICD-10-CM

## 2020-09-08 DIAGNOSIS — M47816 Spondylosis without myelopathy or radiculopathy, lumbar region: Secondary | ICD-10-CM

## 2020-09-08 DIAGNOSIS — R109 Unspecified abdominal pain: Secondary | ICD-10-CM

## 2020-09-08 DIAGNOSIS — G894 Chronic pain syndrome: Secondary | ICD-10-CM

## 2020-09-08 DIAGNOSIS — M549 Dorsalgia, unspecified: Secondary | ICD-10-CM

## 2020-09-08 MED ORDER — OXYCODONE-ACETAMINOPHEN 10-325 MG PO TABS: 1.0000 | ORAL_TABLET | Freq: Three times a day (TID) | ORAL | 0 refills | Status: AC | PRN

## 2020-09-08 MED ORDER — PREDNISONE 10 MG (21) PO TBPK: ORAL_TABLET | ORAL | 0 refills | Status: AC

## 2020-09-08 MED ORDER — OXYCODONE-ACETAMINOPHEN 10-325 MG PO TABS: 1.0000 | ORAL_TABLET | Freq: Three times a day (TID) | ORAL | 0 refills | Status: DC | PRN

## 2020-09-08 NOTE — Progress Notes (Signed)
Virtual Visit via Telephone Note  I connected with Benjamin Murillo on 09/08/20 at  1:15 PM EDT by telephone and verified that I am speaking with the correct person using two identifiers.  Location: Patient: Home Provider: Pain control center   I discussed the limitations, risks, security and privacy concerns of performing an evaluation and management service by telephone and the availability of in person appointments. I also discussed with the patient that there may be a patient responsible charge related to this service. The patient expressed understanding and agreed to proceed.   History of Present Illness: I spoke with Benjamin Murillo via telephone as we were unable to link for the video portion of the virtual conference he reports that he is having a lot of neck pain and shoulder pain.  He has had this in the past and it has been more severe over the past month.  He is unsure of what might of prompted this.  No weakness in the upper extremities reported.  No numbness or tingling affecting his hands is noted either.  The pain mainly radiates from his neck into the right shoulder and is involving the right side of his neck.  He has had this in the past.  He feels that the pain intensifies with certain activity.  He has been taking his medications for his chronic abdominal pain approximately 2-3 times a day.  These continue to work well for that.  He does get some mild relief with the neck pain from his opioids.  Otherwise he is in his usual state of health.  The abdominal pain is been stable in nature with no recent changes.  This is followed by his gastrointestinal doctors.  In regards to the neck pain, this is been more of a gnawing aching pain worse after laying down for any extended period of time.  It is slightly better with ice application or heat.  He denies recent fevers chills cough or headaches.  Review of systems: General: No fevers or chills Pulmonary: No shortness of breath or  dyspnea Cardiac: No angina or palpitations or lightheadedness GI: No abdominal pain or constipation Psych: No depression    Observations/Objective:  Current Outpatient Medications:    [START ON 10/08/2020] oxyCODONE-acetaminophen (PERCOCET) 10-325 MG tablet, Take 1 tablet by mouth every 8 (eight) hours as needed for pain., Disp: 75 tablet, Rfl: 0   predniSONE (STERAPRED UNI-PAK 21 TAB) 10 MG (21) TBPK tablet, 6 tablets day one and day two, five tablets day three and day four, and daily taper over 12days as directed, Disp: 42 tablet, Rfl: 0   amlodipine-atorvastatin (CADUET) 10-10 MG tablet, Take 1 tablet by mouth daily., Disp: , Rfl:    citalopram (CELEXA) 10 MG tablet, Take 10 mg by mouth daily as needed., Disp: , Rfl:    dronabinol (MARINOL) 10 MG capsule, Take 10 mg by mouth 2 (two) times daily before a meal., Disp: , Rfl:    famotidine (PEPCID) 20 MG tablet, Take 20 mg by mouth 2 (two) times daily., Disp: , Rfl:    gabapentin (NEURONTIN) 100 MG capsule, Take 1 capsule (100 mg total) by mouth at bedtime., Disp: 30 capsule, Rfl: 3   gabapentin (NEURONTIN) 300 MG capsule, Take 300 mg by mouth daily., Disp: , Rfl:    hyoscyamine (LEVSIN, ANASPAZ) 0.125 MG tablet, Take 0.125 mg by mouth as needed. , Disp: , Rfl:    loperamide (IMODIUM A-D) 2 MG tablet, Take 2 mg by mouth as needed., Disp: , Rfl:  naloxone (NARCAN) nasal spray 4 mg/0.1 mL, For respiratory depression from opioids, Disp: 1 kit, Rfl: 2   oxyCODONE-acetaminophen (PERCOCET) 10-325 MG tablet, Take 1 tablet by mouth every 8 (eight) hours as needed for pain., Disp: 75 tablet, Rfl: 0   prochlorperazine (COMPAZINE) 5 MG tablet, Take 5 mg by mouth every 8 (eight) hours as needed., Disp: , Rfl:    STELARA 90 MG/ML SOSY injection, Inject 90 mg as directed., Disp: , Rfl:    Assessment and Plan: 1. Chronic pain syndrome   2. Chronic, continuous use of opioids   3. Cervicalgia   4. Chronic neck and back pain   5. Facet arthritis of lumbar  region   6. Chronic abdominal pain   7. Cervical radiculitis   Based on her discussion today and upon review of the [place] practitioner database information where to refill his medicines for the next 2 months dated for August 14 and September 13.  His recent urine screen back in March was appropriate.  He she has no evidence of problems with the medications or illicit or diverting use.  He continues to derive good functional benefit from the medicines.  Also going to start him on an oral steroid Dosepak with a 6060 [id number] 12-day descending stop.  We gone over the risks and benefits of oral steroid medications.  He is to contact us should he have any problems with this regimen and we will schedule him for a 1 month reevaluation.  We did talk about possible trigger point injections for his neck as well.  He is to continue follow-up with his primary care physicians and GI doctors for his baseline medical care.  Follow Up Instructions:    I discussed the assessment and treatment plan with the patient. The patient was provided an opportunity to ask questions and all were answered. The patient agreed with the plan and demonstrated an understanding of the instructions.   The patient was advised to call back or seek an in-person evaluation if the symptoms worsen or if the condition fails to improve as anticipated.  I provided 30 minutes of non-face-to-face time during this encounter.   [name], MD

## 2020-09-11 MED FILL — STELARA 90 MG/ML SUBCUTANEOUS SYRINGE: SUBCUTANEOUS | 56 days supply | Qty: 1 | Fill #2

## 2020-09-14 MED ORDER — DRONABINOL 10 MG CAPSULE
ORAL_CAPSULE | 0 refills | 0 days | Status: CP
Start: 2020-09-14 — End: ?

## 2020-09-23 MED ORDER — PROMETHAZINE 25 MG TABLET
ORAL_TABLET | Freq: Four times a day (QID) | ORAL | 2 refills | 8.00000 days | PRN
Start: 2020-09-23 — End: ?

## 2020-09-23 MED ORDER — GABAPENTIN 300 MG CAPSULE
ORAL_CAPSULE | Freq: Three times a day (TID) | ORAL | 4 refills | 30.00000 days
Start: 2020-09-23 — End: 2021-09-23

## 2020-09-25 MED ORDER — GABAPENTIN 300 MG CAPSULE
ORAL_CAPSULE | Freq: Three times a day (TID) | ORAL | 4 refills | 30.00000 days | Status: CP
Start: 2020-09-25 — End: 2021-09-25

## 2020-09-25 MED ORDER — PROMETHAZINE 25 MG TABLET
ORAL_TABLET | Freq: Four times a day (QID) | ORAL | 2 refills | 8 days | Status: CP | PRN
Start: 2020-09-25 — End: ?

## 2020-10-14 MED ORDER — DRONABINOL 10 MG CAPSULE
ORAL_CAPSULE | 0 refills | 0 days
Start: 2020-10-14 — End: ?

## 2020-10-16 MED ORDER — DRONABINOL 10 MG CAPSULE
ORAL_CAPSULE | 0 refills | 0 days | Status: CP
Start: 2020-10-16 — End: ?

## 2020-10-25 NOTE — Unmapped (Signed)
[facility] Clinical Assessment & Refill Coordination Note    Rodney Bender [place], [place]: [birth year]  Phone: [phone] (home)     All above HIPAA information was verified with patient.     Was a Nurse, learning disability used for this call? No    Specialty Medication(s):   Inflammatory Disorders: Stelara     Current Outpatient Medications   Medication Sig Dispense Refill   ??? citalopram (CELEXA) 10 MG tablet Take 10 mg by mouth daily as needed.     ??? dronabinoL (MARINOL) 10 MG capsule TAKE 1 CAPSULE BY MOUTH TWICE DAILY 30  MINUTES  BEFORE  A  MEAL 60 capsule 0   ??? gabapentin (NEURONTIN) 300 MG capsule Take 1 capsule (300 mg total) by mouth Three (3) times a day. 90 capsule 4   ??? loperamide (IMODIUM A-D) 2 mg tablet Take 2 mg by mouth.     ??? 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 oxyCODONE-acetaminophen (PERCOCET) 10-325 mg per tablet Take 1 tablet by mouth every eight (8) hours as needed.     ??? promethazine (PHENERGAN) 25 MG tablet Take 1 tablet (25 mg total) by mouth every six (6) hours as needed for nausea. 30 tablet 2   ??? sildenafil (VIAGRA) 50 MG tablet Take 50 mg by mouth daily as needed for erectile dysfunction.     ??? simethicone (MYLICON) 125 MG chewable tablet Take 2 tablets (250 mg) at 5:00 pm the day before your procedure. Take remaining 2 tablets (250 mg) at least 4 hours before your procedure time. 4 tablet 0   ??? therapeutic multivitamin (THERAGRAN) tablet Take 1 tablet by mouth daily.      ??? ustekinumab (STELARA) 90 mg/mL Syrg syringe Inject the contents of 1 syringe (90 mg total) under the skin every 8 weeks. 1 mL 5     No current facility-administered medications for this visit.        Changes to medications: Marquice reports no changes at this time.    Allergies   Allergen Reactions   ??? Tramadol Itching and Anxiety     Other reaction(s): Other (See Comments)   ??? Penicillins      Other reaction(s): Itching-Allergy   ??? Dilaudid [Hydromorphone] Anxiety   ??? Morphine Itching       Changes to allergies: No    SPECIALTY MEDICATION ADHERENCE     Stelara 90 mg/ml: 4 days of medicine on hand       Medication Adherence    Patient reported X missed doses in the last month: 0  Specialty Medication: Stelara 90mg /ml  Patient is on additional specialty medications: No  Patient is on more than two specialty medications: No  Any gaps in refill history greater than 2 weeks in the last 3 months: no  Demonstrates understanding of importance of adherence: yes  Reliability of informant: reliable  Patient is at risk for Non-Adherence: No  Reasons for non-adherence: no problems identified  Confirmed plan for next specialty medication refill: delivery by pharmacy          Specialty medication(s) dose(s) confirmed: Regimen is correct and unchanged.     Are there any concerns with adherence? No    Adherence counseling provided? Not needed    CLINICAL MANAGEMENT AND INTERVENTION      Clinical Benefit Assessment:    Do you feel the medicine is effective or helping your condition? Yes    Clinical Benefit counseling  provided? Not needed    Adverse Effects Assessment:    Are you experiencing any side effects? No    Are you experiencing difficulty administering your medicine? No    Quality of Life Assessment:    Quality of Life    Rheumatology  Oncology  Dermatology  Cystic Fibrosis          How many days over the past month did your crohn's disease  keep you from your normal activities? For example, brushing your teeth or getting up in the morning. Patient declined to answer    Have you discussed this with your provider? Not needed    Acute Infection Status:    Acute infections noted within Epic:  No active infections  Patient reported infection: None    Therapy Appropriateness:    Is therapy appropriate and patient progressing towards therapeutic goals? Yes, therapy is appropriate and should be continued    DISEASE/MEDICATION-SPECIFIC INFORMATION      For patients on injectable medications: Patient currently has 0 doses left.  Next injection is scheduled for 10/30/2020.    PATIENT SPECIFIC NEEDS     - Does the patient have any physical, cognitive, or cultural barriers? No    - Is the patient high risk? No    - Does the patient require a Care Management Plan? No     - Does the patient require physician intervention or other additional services (i.e. nutrition, smoking cessation, social work)? No      SHIPPING     Specialty Medication(s) to be Shipped:   Inflammatory Disorders: Stelara    Other medication(s) to be shipped: No additional medications requested for fill at this time     Changes to insurance: No    Delivery Scheduled: Yes, Expected medication delivery date: 10/26/2020.     Medication will be delivered via Same Day Courier to the confirmed prescription address in [facility].    The patient will receive a drug information handout for each medication shipped and additional FDA Medication Guides as required.  Verified that patient has previously received a Conservation officer, historic buildings and a Surveyor, mining.    The patient or caregiver noted above participated in the development of this care plan and knows that they can request review of or adjustments to the care plan at any time.      All of the patient's questions and concerns have been addressed.    Rodney Bender   [facility] Specialty Pharmacist

## 2020-10-26 MED FILL — STELARA 90 MG/ML SUBCUTANEOUS SYRINGE: SUBCUTANEOUS | 56 days supply | Qty: 1 | Fill #3

## 2020-11-06 ENCOUNTER — Ambulatory Visit: Payer: Medicare Other | Attending: Anesthesiology | Admitting: Anesthesiology

## 2020-11-06 ENCOUNTER — Other Ambulatory Visit: Payer: Self-pay

## 2020-11-06 ENCOUNTER — Encounter: Payer: Self-pay | Admitting: Anesthesiology

## 2020-11-06 DIAGNOSIS — R200 Anesthesia of skin: Secondary | ICD-10-CM

## 2020-11-06 DIAGNOSIS — M47816 Spondylosis without myelopathy or radiculopathy, lumbar region: Secondary | ICD-10-CM | POA: Diagnosis not present

## 2020-11-06 DIAGNOSIS — M542 Cervicalgia: Secondary | ICD-10-CM

## 2020-11-06 DIAGNOSIS — G894 Chronic pain syndrome: Secondary | ICD-10-CM | POA: Diagnosis not present

## 2020-11-06 DIAGNOSIS — F1190 Opioid use, unspecified, uncomplicated: Secondary | ICD-10-CM | POA: Diagnosis not present

## 2020-11-06 DIAGNOSIS — M5412 Radiculopathy, cervical region: Secondary | ICD-10-CM

## 2020-11-06 DIAGNOSIS — M25511 Pain in right shoulder: Secondary | ICD-10-CM

## 2020-11-06 DIAGNOSIS — R109 Unspecified abdominal pain: Secondary | ICD-10-CM

## 2020-11-06 DIAGNOSIS — M549 Dorsalgia, unspecified: Secondary | ICD-10-CM

## 2020-11-06 DIAGNOSIS — G8929 Other chronic pain: Secondary | ICD-10-CM

## 2020-11-06 MED ORDER — OXYCODONE-ACETAMINOPHEN 10-325 MG PO TABS: 1.0000 | ORAL_TABLET | Freq: Three times a day (TID) | ORAL | 0 refills | Status: AC | PRN

## 2020-11-06 NOTE — Progress Notes (Signed)
Virtual Visit via Telephone Note  I connected with Benjamin Murillo on 11/06/20 at  1:45 PM EDT by telephone and verified that I am speaking with the correct person using two identifiers.  Location: Patient: Home Provider: Pain control center   I discussed the limitations, risks, security and privacy concerns of performing an evaluation and management service by telephone and the availability of in person appointments. I also discussed with the patient that there may be a patient responsible charge related to this service. The patient expressed understanding and agreed to proceed.   History of Present Illness: I spoke with Benjamin Murillo today via telephone as he was unable to link with the video portion of virtual conferencing reports that his low back pain and abdominal pain have been stable in nature.  He is taking Percocet 10 325 tablets on 3 times a day as needed basis.  Has had up to this from his normal 75 tablets average per month because of increasing pain in the right shoulder following surgery and new onset right upper extremity numbness affecting all fingers on the right side.  He is due to see his orthopedic surgeon regarding this in the next few days.  He did have a cervical MRI in 2014 that showed a C6-C7 cervical disc protrusion affecting the left foramen as reviewed with him today.  The right side weakness he feels is more intense following the recent shoulder surgery.  Otherwise he appears to be doing well.  He takes his medications as prescribed generally gets 50 to 75% relief lasting about 4 to 6 hours before his recurrence of the same baseline pain.  The covering his abdominal pain and low back pain well however the right arm numbness and right shoulder pain have been significant and problematic.  No other changes in the quality characteristic or distribution of his pain are noted.  Review of systems: General: No fevers or chills Pulmonary: No shortness of breath or dyspnea Cardiac: No  angina or palpitations or lightheadedness GI: No abdominal pain or constipation Psych: No depression    Observations/Objective:  Current Outpatient Medications:    [START ON 12/06/2020] oxyCODONE-acetaminophen (PERCOCET) 10-325 MG tablet, Take 1 tablet by mouth every 8 (eight) hours as needed for pain., Disp: 75 tablet, Rfl: 0   amlodipine-atorvastatin (CADUET) 10-10 MG tablet, Take 1 tablet by mouth daily., Disp: , Rfl:    citalopram (CELEXA) 10 MG tablet, Take 10 mg by mouth daily as needed., Disp: , Rfl:    dronabinol (MARINOL) 10 MG capsule, Take 10 mg by mouth 2 (two) times daily before a meal., Disp: , Rfl:    famotidine (PEPCID) 20 MG tablet, Take 20 mg by mouth 2 (two) times daily., Disp: , Rfl:    gabapentin (NEURONTIN) 100 MG capsule, Take 1 capsule (100 mg total) by mouth at bedtime., Disp: 30 capsule, Rfl: 3   gabapentin (NEURONTIN) 300 MG capsule, Take 300 mg by mouth daily., Disp: , Rfl:    hyoscyamine (LEVSIN, ANASPAZ) 0.125 MG tablet, Take 0.125 mg by mouth as needed. , Disp: , Rfl:    loperamide (IMODIUM A-D) 2 MG tablet, Take 2 mg by mouth as needed., Disp: , Rfl:    naloxone (NARCAN) nasal spray 4 mg/0.1 mL, For respiratory depression from opioids, Disp: 1 kit, Rfl: 2   oxyCODONE-acetaminophen (PERCOCET) 10-325 MG tablet, Take 1 tablet by mouth every 8 (eight) hours as needed for pain., Disp: 75 tablet, Rfl: 0   predniSONE (STERAPRED UNI-PAK 21 TAB) 10 MG (  21) TBPK tablet, 6 tablets day one and day two, five tablets day three and day four, and daily taper over 12days as directed, Disp: 42 tablet, Rfl: 0   prochlorperazine (COMPAZINE) 5 MG tablet, Take 5 mg by mouth every 8 (eight) hours as needed., Disp: , Rfl:    STELARA 90 MG/ML SOSY injection, Inject 90 mg as directed., Disp: , Rfl:    Assessment and Plan: 1. Chronic pain syndrome   2. Chronic, continuous use of opioids   3. Cervicalgia   4. Chronic neck and back pain   5. Facet arthritis of lumbar region   6. Chronic  abdominal pain   7. Cervical radiculitis   8. Acute pain of right shoulder   9. Right arm numbness   Based on her discussion today and upon review of the [facility] practitioner database information it is appropriate to refill his medicines.  He is 1 day early but we will make his prescription due for October 10 and November 9.  I encouraged him to continue his stretching strengthening exercises for his low back and continue follow-up with his gastroenterologist for his chronic abdominal pain.  In regards to the right shoulder and right arm numbness that he is reporting he is scheduled to see his orthopedist for evaluation of this.  I have asked that he keep [place] informed as to the update on his neck and right shoulder pain and we will schedule him for a routine return to clinic in 2 months for reevaluation.  He is to continue follow-up with his primary care physician for baseline medical care additionally.  Follow Up Instructions:    I discussed the assessment and treatment plan with the patient. The patient was provided an opportunity to ask questions and all were answered. The patient agreed with the plan and demonstrated an understanding of the instructions.   The patient was advised to call back or seek an in-person evaluation if the symptoms worsen or if the condition fails to improve as anticipated.  I provided 30 minutes of non-face-to-face time during this encounter.   [name], MD Home

## 2020-11-10 MED ORDER — DRONABINOL 10 MG CAPSULE
ORAL_CAPSULE | 0 refills | 0 days | Status: CP
Start: 2020-11-10 — End: ?

## 2020-12-07 NOTE — Unmapped (Addendum)
[facility] Refill Coordination Note    Specialty Medication(s) to be Shipped:   Inflammatory Disorders: Stelara    Other medication(s) to be shipped: No additional medications requested for fill at this time     Rodney Bender, DOB: 11-24-1970  Phone: [phone] (home)       All above HIPAA information was verified with patient.     Was a Nurse, learning disability used for this call? No    Completed refill call assessment today to schedule patient's medication shipment from the [facility] [phone]).  All relevant notes have been reviewed.     Specialty medication(s) and dose(s) confirmed: Regimen is correct and unchanged.   Changes to medications: [name] reports no changes at this time.  Changes to insurance: No  New side effects reported not previously addressed with a pharmacist or physician: None reported  Questions for the pharmacist: No    Confirmed patient received a Conservation officer, historic buildings and a Surveyor, mining with first shipment. The patient will receive a drug information handout for each medication shipped and additional FDA Medication Guides as required.       DISEASE/MEDICATION-SPECIFIC INFORMATION        For patients on injectable medications: Patient currently has 0 doses left.  Next injection is scheduled for 12/25/20.    SPECIALTY MEDICATION ADHERENCE     Medication Adherence    Patient reported X missed doses in the last month: 0  Specialty Medication: STELARA 90 mg/mL  Patient is on additional specialty medications: No  Patient is on more than two specialty medications: No  Any gaps in refill history greater than 2 weeks in the last 3 months: no  Demonstrates understanding of importance of adherence: yes  Informant: patient  Reliability of informant: reliable  Confirmed plan for next specialty medication refill: delivery by pharmacy  Refills needed for supportive medications: not needed              Were doses missed due to medication being on hold? No    STELARA 90 mg/mL: 0 days of medicine on hand       REFERRAL TO PHARMACIST     Referral to the pharmacist: Not needed      [facility]     Shipping address confirmed in Epic.     Delivery Scheduled: Yes, Expected medication delivery date: 12/20/20.     Medication will be delivered via Same Day Courier to the prescription address in Epic WAM.    Rodney Bender   Rodney Bender

## 2020-12-11 MED ORDER — PROMETHAZINE 25 MG TABLET
ORAL_TABLET | 0 refills | 0 days | Status: CP
Start: 2020-12-11 — End: ?

## 2020-12-18 MED ORDER — DRONABINOL 10 MG CAPSULE
ORAL_CAPSULE | 0 refills | 0 days | Status: CP
Start: 2020-12-18 — End: ?

## 2020-12-20 MED FILL — STELARA 90 MG/ML SUBCUTANEOUS SYRINGE: SUBCUTANEOUS | 56 days supply | Qty: 1 | Fill #4

## 2020-12-25 MED ORDER — PROMETHAZINE 25 MG TABLET
ORAL_TABLET | 0 refills | 0 days
Start: 2020-12-25 — End: ?

## 2021-01-01 DIAGNOSIS — R110 Nausea: Principal | ICD-10-CM

## 2021-01-01 DIAGNOSIS — K50012 Crohn's disease of small intestine with intestinal obstruction: Principal | ICD-10-CM

## 2021-01-01 MED ORDER — PROMETHAZINE 25 MG TABLET
ORAL_TABLET | Freq: Four times a day (QID) | ORAL | 0 refills | 0.00000 days | Status: CP | PRN
Start: 2021-01-01 — End: ?

## 2021-01-02 ENCOUNTER — Telehealth: Payer: Self-pay

## 2021-01-02 NOTE — Telephone Encounter (Unsigned)
Pt runs out of meds on 01/06/21 called today 01/02/21 to request and appointment with Dr.A. Dr.A does not have any appointments until 01/11/21. Not sure what to tell him he was suppose to call on/around 12/07/20 to schedule his appt. He would like a nurse to call him and speak with him.

## 2021-01-02 NOTE — Unmapped (Signed)
Promethazine refill request from [facility] pharmacy in [place]. Notes reviewed. Refilled x 30 tabs.

## 2021-01-09 ENCOUNTER — Ambulatory Visit: Payer: Medicare Other | Attending: Anesthesiology | Admitting: Anesthesiology

## 2021-01-09 ENCOUNTER — Other Ambulatory Visit: Payer: Self-pay

## 2021-01-09 ENCOUNTER — Encounter: Payer: Self-pay | Admitting: Anesthesiology

## 2021-01-09 DIAGNOSIS — F1190 Opioid use, unspecified, uncomplicated: Secondary | ICD-10-CM | POA: Diagnosis not present

## 2021-01-09 DIAGNOSIS — M47816 Spondylosis without myelopathy or radiculopathy, lumbar region: Secondary | ICD-10-CM

## 2021-01-09 DIAGNOSIS — M25511 Pain in right shoulder: Secondary | ICD-10-CM

## 2021-01-09 DIAGNOSIS — F1920 Other psychoactive substance dependence, uncomplicated: Secondary | ICD-10-CM

## 2021-01-09 DIAGNOSIS — G894 Chronic pain syndrome: Secondary | ICD-10-CM | POA: Diagnosis not present

## 2021-01-09 DIAGNOSIS — M542 Cervicalgia: Secondary | ICD-10-CM

## 2021-01-09 DIAGNOSIS — R200 Anesthesia of skin: Secondary | ICD-10-CM

## 2021-01-09 DIAGNOSIS — G8929 Other chronic pain: Secondary | ICD-10-CM

## 2021-01-09 DIAGNOSIS — M549 Dorsalgia, unspecified: Secondary | ICD-10-CM

## 2021-01-09 DIAGNOSIS — M5412 Radiculopathy, cervical region: Secondary | ICD-10-CM

## 2021-01-09 DIAGNOSIS — K5090 Crohn's disease, unspecified, without complications: Secondary | ICD-10-CM

## 2021-01-09 DIAGNOSIS — R109 Unspecified abdominal pain: Secondary | ICD-10-CM

## 2021-01-09 MED ORDER — OXYCODONE-ACETAMINOPHEN 10-325 MG PO TABS: 1.0000 | ORAL_TABLET | Freq: Three times a day (TID) | ORAL | 0 refills | Status: AC | PRN

## 2021-01-09 MED ORDER — OXYCODONE-ACETAMINOPHEN 10-325 MG PO TABS: 1.0000 | ORAL_TABLET | Freq: Three times a day (TID) | ORAL | 0 refills | Status: DC | PRN

## 2021-01-11 ENCOUNTER — Encounter: Payer: Self-pay | Admitting: Anesthesiology

## 2021-01-11 NOTE — Progress Notes (Signed)
Virtual Visit via Telephone Note  I connected with Benjamin Murillo on 01/11/21 at  2:00 PM EST by telephone and verified that I am speaking with the correct person using two identifiers.  Location: Patient: Home Provider: Pain control center   I discussed the limitations, risks, security and privacy concerns of performing an evaluation and management service by telephone and the availability of in person appointments. I also discussed with the patient that there may be a patient responsible charge related to this service. The patient expressed understanding and agreed to proceed.   History of Present Illness: I was able to speak with Benjamin Murillo via telephone today we were unable to link for the video portion of the conference but he reports that he is doing reasonably well with his low back pain neck pain and abdominal pain.  These of all been relatively stable as of recent and no significant changes are reported today.  The quality characteristic and distribution of these issues have been stable.  He is taking his medications as prescribed generally averaging twice a day up to 3 times a day dosing with no side effects reported with the opioids.  He gets good relief and these enable him to continue staying active and functional and sleep well at night per our discussion today.  No side effects are reported.  Otherwise he is in his usual state of health at this point.  He continues to follow along with his GI physicians for his abdominal pain.    Review of systems: General: No fevers or chills Pulmonary: No shortness of breath or dyspnea Cardiac: No angina or palpitations or lightheadedness GI: No abdominal pain or constipation Psych: No depression    Observations/Objective:  Current Outpatient Medications:    oxyCODONE-acetaminophen (PERCOCET) 10-325 MG tablet, Take 1 tablet by mouth every 8 (eight) hours as needed for pain., Disp: 75 tablet, Rfl: 0   [START ON 02/08/2021]  oxyCODONE-acetaminophen (PERCOCET) 10-325 MG tablet, Take 1 tablet by mouth every 8 (eight) hours as needed for pain., Disp: 75 tablet, Rfl: 0   amlodipine-atorvastatin (CADUET) 10-10 MG tablet, Take 1 tablet by mouth daily., Disp: , Rfl:    citalopram (CELEXA) 10 MG tablet, Take 10 mg by mouth daily as needed., Disp: , Rfl:    dronabinol (MARINOL) 10 MG capsule, Take 10 mg by mouth 2 (two) times daily before a meal., Disp: , Rfl:    famotidine (PEPCID) 20 MG tablet, Take 20 mg by mouth 2 (two) times daily., Disp: , Rfl:    gabapentin (NEURONTIN) 100 MG capsule, Take 1 capsule (100 mg total) by mouth at bedtime., Disp: 30 capsule, Rfl: 3   gabapentin (NEURONTIN) 300 MG capsule, Take 300 mg by mouth daily., Disp: , Rfl:    hyoscyamine (LEVSIN, ANASPAZ) 0.125 MG tablet, Take 0.125 mg by mouth as needed. , Disp: , Rfl:    loperamide (IMODIUM A-D) 2 MG tablet, Take 2 mg by mouth as needed., Disp: , Rfl:    naloxone (NARCAN) nasal spray 4 mg/0.1 mL, For respiratory depression from opioids, Disp: 1 kit, Rfl: 2   predniSONE (STERAPRED UNI-PAK 21 TAB) 10 MG (21) TBPK tablet, 6 tablets day one and day two, five tablets day three and day four, and daily taper over 12days as directed, Disp: 42 tablet, Rfl: 0   prochlorperazine (COMPAZINE) 5 MG tablet, Take 5 mg by mouth every 8 (eight) hours as needed., Disp: , Rfl:    STELARA 90 MG/ML SOSY injection, Inject 90 mg as  directed., Disp: , Rfl:    Assessment and Plan: 1. Chronic pain syndrome   2. Drug dependence ([place]) Chronic  3. Crohn's disease without complication, unspecified gastrointestinal tract location [facility]) Chronic  4. Chronic, continuous use of opioids   5. Cervicalgia   6. Chronic neck and back pain   7. Facet arthritis of lumbar region   8. Chronic abdominal pain   9. Cervical radiculitis   10. Acute pain of right shoulder   11. Right arm numbness   Based on our discussion today and upon review of the [facility] practitioner database  information it is appropriate to refill his medications today.  This will be for the next 2 months dated for December 13 and January 12.  No other changes in his pain management regimen will be initiated at this time I have encouraged him to continue follow-up with his primary care physicians and his gastroenterologist additionally.  We will schedule him for 77-monthreturn to clinic.  Follow Up Instructions:    I discussed the assessment and treatment plan with the patient. The patient was provided an opportunity to ask questions and all were answered. The patient agreed with the plan and demonstrated an understanding of the instructions.   The patient was advised to call back or seek an in-person evaluation if the symptoms worsen or if the condition fails to improve as anticipated.  I provided 30 minutes of non-face-to-face time during this encounter.   JMolli Barrows MD

## 2021-01-12 DIAGNOSIS — K50012 Crohn's disease of small intestine with intestinal obstruction: Principal | ICD-10-CM

## 2021-01-12 DIAGNOSIS — R110 Nausea: Principal | ICD-10-CM

## 2021-01-12 MED ORDER — PROMETHAZINE 25 MG TABLET
ORAL_TABLET | 0 refills | 0 days
Start: 2021-01-12 — End: ?

## 2021-01-17 MED ORDER — PROMETHAZINE 25 MG TABLET
ORAL_TABLET | ORAL | 0 refills | 0.00000 days | Status: CP
Start: 2021-01-17 — End: ?

## 2021-01-19 MED ORDER — DRONABINOL 10 MG CAPSULE
ORAL_CAPSULE | 0 refills | 0 days
Start: 2021-01-19 — End: ?

## 2021-01-23 MED ORDER — DRONABINOL 10 MG CAPSULE
ORAL_CAPSULE | 0 refills | 0 days | Status: CP
Start: 2021-01-23 — End: ?

## 2021-01-30 DIAGNOSIS — K50012 Crohn's disease of small intestine with intestinal obstruction: Principal | ICD-10-CM

## 2021-01-30 DIAGNOSIS — R110 Nausea: Principal | ICD-10-CM

## 2021-01-30 MED ORDER — PROMETHAZINE 25 MG TABLET
ORAL_TABLET | 0 refills | 0.00000 days
Start: 2021-01-30 — End: ?

## 2021-02-02 MED ORDER — PROMETHAZINE 25 MG TABLET
ORAL_TABLET | 2 refills | 0 days | Status: CP
Start: 2021-02-02 — End: ?

## 2021-02-02 MED ORDER — GABAPENTIN 300 MG CAPSULE
ORAL_CAPSULE | 0 refills | 0 days
Start: 2021-02-02 — End: ?

## 2021-02-02 NOTE — Unmapped (Signed)
Refilled promethazine for 3 months (last clinic visit 03/2020) with note to pharmacy that pt is due for follow-up appointment.  Routing to GI schedulers.

## 2021-02-02 NOTE — Unmapped (Signed)
Good afternoon, pt is scheduled for 02/17 @10 :40 with Dr. Elizebeth Brooking

## 2021-02-06 MED ORDER — GABAPENTIN 300 MG CAPSULE
ORAL_CAPSULE | ORAL | 0 refills | 0.00000 days | Status: CP
Start: 2021-02-06 — End: ?

## 2021-02-07 NOTE — Unmapped (Signed)
[facility] Refill Coordination Note    Specialty Medication(s) to be Shipped:   Inflammatory Disorders: Stelara    Other medication(s) to be shipped: No additional medications requested for fill at this time     Rodney Bender, DOB: 1970-07-28  Phone: [phone] (home)       All above HIPAA information was verified with patient.     Was a Nurse, learning disability used for this call? No    Completed refill call assessment today to schedule patient's medication shipment from the [facility] [phone]).  All relevant notes have been reviewed.     Specialty medication(s) and dose(s) confirmed: Regimen is correct and unchanged.   Changes to medications: Saw reports no changes at this time.  Changes to insurance: No  New side effects reported not previously addressed with a pharmacist or physician: None reported  Questions for the pharmacist: No    Confirmed patient received a Conservation officer, historic buildings and a Surveyor, mining with first shipment. The patient will receive a drug information handout for each medication shipped and additional FDA Medication Guides as required.       DISEASE/MEDICATION-SPECIFIC INFORMATION        For patients on injectable medications: Patient currently has 0 doses left.  Next injection is scheduled for 02/19/21.    SPECIALTY MEDICATION ADHERENCE     Medication Adherence    Patient reported X missed doses in the last month: 0  Specialty Medication: STELARA 90 mg/mL  Patient is on additional specialty medications: No  Patient is on more than two specialty medications: No  Any gaps in refill history greater than 2 weeks in the last 3 months: no  Demonstrates understanding of importance of adherence: yes  Informant: patient  Reliability of informant: reliable  Confirmed plan for next specialty medication refill: delivery by pharmacy  Refills needed for supportive medications: not needed              Were doses missed due to medication being on hold? No    STELARA 90 mg/mL : 0 days of medicine on hand     REFERRAL TO PHARMACIST     Referral to the pharmacist: Not needed      [facility]     Shipping address confirmed in Epic.     Delivery Scheduled: Yes, Expected medication delivery date: 02/16/21.     Medication will be delivered via Same Day Courier to the prescription address in Epic WAM.    [name]   [facility] Specialty Technician

## 2021-02-16 MED FILL — STELARA 90 MG/ML SUBCUTANEOUS SYRINGE: SUBCUTANEOUS | 56 days supply | Qty: 1 | Fill #5

## 2021-02-28 MED ORDER — GABAPENTIN 300 MG CAPSULE
ORAL_CAPSULE | 0 refills | 0.00000 days
Start: 2021-02-28 — End: ?

## 2021-03-03 MED ORDER — DRONABINOL 10 MG CAPSULE
ORAL_CAPSULE | 0 refills | 0 days
Start: 2021-03-03 — End: ?

## 2021-03-05 ENCOUNTER — Other Ambulatory Visit: Payer: Self-pay

## 2021-03-05 ENCOUNTER — Ambulatory Visit: Payer: Medicare Other | Attending: Anesthesiology | Admitting: Anesthesiology

## 2021-03-05 DIAGNOSIS — K5090 Crohn's disease, unspecified, without complications: Secondary | ICD-10-CM | POA: Diagnosis present

## 2021-03-05 DIAGNOSIS — F1920 Other psychoactive substance dependence, uncomplicated: Secondary | ICD-10-CM

## 2021-03-05 DIAGNOSIS — M5412 Radiculopathy, cervical region: Secondary | ICD-10-CM

## 2021-03-05 DIAGNOSIS — M542 Cervicalgia: Secondary | ICD-10-CM

## 2021-03-05 DIAGNOSIS — F1190 Opioid use, unspecified, uncomplicated: Secondary | ICD-10-CM | POA: Diagnosis present

## 2021-03-05 DIAGNOSIS — G8929 Other chronic pain: Secondary | ICD-10-CM | POA: Diagnosis present

## 2021-03-05 DIAGNOSIS — G894 Chronic pain syndrome: Secondary | ICD-10-CM

## 2021-03-05 DIAGNOSIS — R109 Unspecified abdominal pain: Secondary | ICD-10-CM | POA: Diagnosis present

## 2021-03-05 DIAGNOSIS — M47816 Spondylosis without myelopathy or radiculopathy, lumbar region: Secondary | ICD-10-CM

## 2021-03-05 DIAGNOSIS — M549 Dorsalgia, unspecified: Secondary | ICD-10-CM | POA: Diagnosis present

## 2021-03-05 DIAGNOSIS — R200 Anesthesia of skin: Secondary | ICD-10-CM | POA: Diagnosis present

## 2021-03-05 MED ORDER — GABAPENTIN 300 MG PO CAPS: 300.0000 mg | ORAL_CAPSULE | Freq: Three times a day (TID) | ORAL | 3 refills | Status: DC

## 2021-03-05 MED ORDER — OXYCODONE-ACETAMINOPHEN 10-325 MG PO TABS: 1.0000 | ORAL_TABLET | Freq: Three times a day (TID) | ORAL | 0 refills | Status: DC | PRN

## 2021-03-05 MED ORDER — OXYCODONE-ACETAMINOPHEN 10-325 MG PO TABS: 1.0000 | ORAL_TABLET | Freq: Three times a day (TID) | ORAL | 0 refills | Status: AC | PRN

## 2021-03-05 MED ORDER — DRONABINOL 10 MG CAPSULE
ORAL_CAPSULE | 0 refills | 0 days | Status: CP
Start: 2021-03-05 — End: ?

## 2021-03-07 NOTE — Progress Notes (Signed)
Virtual Visit via Telephone Note  I connected with Benjamin Murillo on 03/07/21 at  4:40 PM EST by telephone and verified that I am speaking with the correct person using two identifiers.  Location: Patient: Home Provider: Pain control center   I discussed the limitations, risks, security and privacy concerns of performing an evaluation and management service by telephone and the availability of in person appointments. I also discussed with the patient that there may be a patient responsible charge related to this service. The patient expressed understanding and agreed to proceed.   History of Present Illness: I spoke with Benjamin Murillo via telephone today as he is unable to do the video portion of the virtual conference.  He states that his abdominal pain and lower back pain and neck pain have been stable.  He still has some intermittent abdominal pain that goes along with his Crohn's but overall he feels that the opioid medications are continue to work well for him.  He takes his Percocet approximately twice a day sometimes up to 3 times a day for abdominal pain control and his low back and this regimen is working well with no side effects reported.  He has approximately 75% improvement lasting about 4 to 6 hours before he has recurrence of his baseline pain but it is the only regimen that has been effective for him.  He denies any diverting or illicit use and has been compliant.  He is trying to stay active doing core stretching strengthening exercises for both his neck and back.  Quality characteristic and distribution of the pain has been stable.  He continues to follow-up with his GI doctors.  Review of systems: General: No fevers or chills Pulmonary: No shortness of breath or dyspnea Cardiac: No angina or palpitations or lightheadedness GI: No abdominal pain or constipation Psych: No depression    Observations/Objective:  Current Outpatient Medications:    [START ON 04/09/2021]  oxyCODONE-acetaminophen (PERCOCET) 10-325 MG tablet, Take 1 tablet by mouth every 8 (eight) hours as needed for pain., Disp: 75 tablet, Rfl: 0   amlodipine-atorvastatin (CADUET) 10-10 MG tablet, Take 1 tablet by mouth daily., Disp: , Rfl:    citalopram (CELEXA) 10 MG tablet, Take 10 mg by mouth daily as needed., Disp: , Rfl:    dronabinol (MARINOL) 10 MG capsule, Take 10 mg by mouth 2 (two) times daily before a meal., Disp: , Rfl:    famotidine (PEPCID) 20 MG tablet, Take 20 mg by mouth 2 (two) times daily., Disp: , Rfl:    gabapentin (NEURONTIN) 100 MG capsule, Take 1 capsule (100 mg total) by mouth at bedtime., Disp: 30 capsule, Rfl: 3   gabapentin (NEURONTIN) 300 MG capsule, Take 1 capsule (300 mg total) by mouth 3 (three) times daily., Disp: 90 capsule, Rfl: 3   hyoscyamine (LEVSIN, ANASPAZ) 0.125 MG tablet, Take 0.125 mg by mouth as needed. , Disp: , Rfl:    loperamide (IMODIUM A-D) 2 MG tablet, Take 2 mg by mouth as needed., Disp: , Rfl:    naloxone (NARCAN) nasal spray 4 mg/0.1 mL, For respiratory depression from opioids, Disp: 1 kit, Rfl: 2   [START ON 03/10/2021] oxyCODONE-acetaminophen (PERCOCET) 10-325 MG tablet, Take 1 tablet by mouth every 8 (eight) hours as needed for pain., Disp: 75 tablet, Rfl: 0   predniSONE (STERAPRED UNI-PAK 21 TAB) 10 MG (21) TBPK tablet, 6 tablets day one and day two, five tablets day three and day four, and daily taper over 12days as directed, Disp:  42 tablet, Rfl: 0   prochlorperazine (COMPAZINE) 5 MG tablet, Take 5 mg by mouth every 8 (eight) hours as needed., Disp: , Rfl:    STELARA 90 MG/ML SOSY injection, Inject 90 mg as directed., Disp: , Rfl:    Past Medical History:  Diagnosis Date   Cervical radiculitis 12/22/2019   Cervical radiculopathy    Chronic abdominal pain    Chronic neck pain    Cold    Crohn disease (HCC)    GERD (gastroesophageal reflux disease)    no meds-2-3 times weekly   Hand fracture, left    Headache(784.0)    Hypertension     no meds    IBS (irritable bowel syndrome)    Shortness of breath    "sometimes laying down; sometimes w/activity" (10/13/2012)     Assessment and Plan: 1. Drug dependence ([place])   2. Crohn's disease without complication, unspecified gastrointestinal tract location ([place])   3. Chronic pain syndrome   4. Chronic, continuous use of opioids   5. Cervicalgia   6. Chronic neck and back pain   7. Facet arthritis of lumbar region   8. Chronic abdominal pain   9. Cervical radiculitis   10. Right arm numbness   Based on her discussion today and think it is appropriate to refill his medicines.  I have reviewed the [facility] practitioner database and it is appropriate.  His last urine screen was in March and he is due for a repeat sometime in the near future.  His refills will be prescribed for February 11 and March 13.  No other changes in his pharmacologic regimen are initiated at this time.  I encouraged him to stay active continue with his core stretching strengthening exercises and continue with his GI follow-up with his gastroenterologist for baseline management in addition to his primary care physicians.  We will schedule him for 49-monthreturn to clinic.  Follow Up Instructions:    I discussed the assessment and treatment plan with the patient. The patient was provided an opportunity to ask questions and all were answered. The patient agreed with the plan and demonstrated an understanding of the instructions.   The patient was advised to call back or seek an in-person evaluation if the symptoms worsen or if the condition fails to improve as anticipated.  I provided 30 minutes of non-face-to-face time during this encounter.   JMolli Barrows MD

## 2021-03-08 MED ORDER — GABAPENTIN 300 MG CAPSULE
ORAL_CAPSULE | 0 refills | 0 days
Start: 2021-03-08 — End: ?

## 2021-03-13 DIAGNOSIS — R110 Nausea: Principal | ICD-10-CM

## 2021-03-13 DIAGNOSIS — K50012 Crohn's disease of small intestine with intestinal obstruction: Principal | ICD-10-CM

## 2021-03-13 MED ORDER — PROMETHAZINE 25 MG TABLET
ORAL_TABLET | 0 refills | 0 days
Start: 2021-03-13 — End: ?

## 2021-03-16 ENCOUNTER — Ambulatory Visit: Admit: 2021-03-16 | Discharge: 2021-03-17 | Payer: MEDICARE

## 2021-03-16 DIAGNOSIS — R21 Rash and other nonspecific skin eruption: Principal | ICD-10-CM

## 2021-03-16 DIAGNOSIS — K50018 Crohn's disease of small intestine with other complication: Principal | ICD-10-CM

## 2021-03-16 DIAGNOSIS — H04129 Dry eye syndrome of unspecified lacrimal gland: Principal | ICD-10-CM

## 2021-03-16 LAB — COMPREHENSIVE METABOLIC PANEL
ALBUMIN: 4.3 g/dL (ref 3.4–5.0)
ALKALINE PHOSPHATASE: 88 U/L (ref 46–116)
ALT (SGPT): 46 U/L (ref 10–49)
ANION GAP: 5 mmol/L (ref 5–14)
AST (SGOT): 52 U/L — ABNORMAL HIGH (ref <=34)
BILIRUBIN TOTAL: 0.7 mg/dL (ref 0.3–1.2)
BLOOD UREA NITROGEN: 12 mg/dL (ref 9–23)
BUN / CREAT RATIO: 14
CALCIUM: 10 mg/dL (ref 8.7–10.4)
CHLORIDE: 101 mmol/L (ref 98–107)
CO2: 24.7 mmol/L (ref 20.0–31.0)
CREATININE: 0.87 mg/dL
EGFR CKD-EPI (2021) MALE: >90 mL/min/{1.73_m2} (ref >=60)
GLUCOSE RANDOM: 96 mg/dL (ref 70–179)
POTASSIUM: 5 mmol/L — ABNORMAL HIGH (ref 3.4–4.8)
PROTEIN TOTAL: 7.8 g/dL (ref 5.7–8.2)
SODIUM: 131 mmol/L — ABNORMAL LOW (ref 135–145)

## 2021-03-16 LAB — CBC W/ AUTO DIFF
BASOPHILS ABSOLUTE COUNT: 0 10*9/L (ref 0.0–0.1)
BASOPHILS RELATIVE PERCENT: 0.5 %
EOSINOPHILS ABSOLUTE COUNT: 0.1 10*9/L (ref 0.0–0.5)
EOSINOPHILS RELATIVE PERCENT: 1.4 %
HEMATOCRIT: 34.5 % — ABNORMAL LOW (ref 39.0–48.0)
HEMOGLOBIN: 11.9 g/dL — ABNORMAL LOW (ref 12.9–16.5)
LYMPHOCYTES ABSOLUTE COUNT: 1.8 10*9/L (ref 1.1–3.6)
LYMPHOCYTES RELATIVE PERCENT: 24.8 %
MEAN CORPUSCULAR HEMOGLOBIN CONC: 34.7 g/dL (ref 32.0–36.0)
MEAN CORPUSCULAR HEMOGLOBIN: 33 pg — ABNORMAL HIGH (ref 25.9–32.4)
MEAN CORPUSCULAR VOLUME: 95.4 fL (ref 77.6–95.7)
MEAN PLATELET VOLUME: 7.1 fL (ref 6.8–10.7)
MONOCYTES ABSOLUTE COUNT: 0.7 10*9/L (ref 0.3–0.8)
MONOCYTES RELATIVE PERCENT: 9.7 %
NEUTROPHILS ABSOLUTE COUNT: 4.5 10*9/L (ref 1.8–7.8)
NEUTROPHILS RELATIVE PERCENT: 63.6 %
PLATELET COUNT: 239 10*9/L (ref 150–450)
RED BLOOD CELL COUNT: 3.62 10*12/L — ABNORMAL LOW (ref 4.26–5.60)
RED CELL DISTRIBUTION WIDTH: 13.7 % (ref 12.2–15.2)
WBC ADJUSTED: 7.1 10*9/L (ref 3.6–11.2)

## 2021-03-16 MED ORDER — OLANZAPINE 2.5 MG TABLET
ORAL_TABLET | Freq: Every evening | ORAL | 0 refills | 30 days | Status: CP
Start: 2021-03-16 — End: 2021-04-15

## 2021-03-16 MED ORDER — GABAPENTIN 300 MG CAPSULE
ORAL_CAPSULE | Freq: Three times a day (TID) | ORAL | 11 refills | 30.00000 days | Status: CP
Start: 2021-03-16 — End: 2022-03-16

## 2021-03-16 MED ORDER — SILDENAFIL 50 MG TABLET
ORAL_TABLET | Freq: Every day | ORAL | 5 refills | 30.00000 days | Status: CP | PRN
Start: 2021-03-16 — End: ?

## 2021-03-16 MED ORDER — PANTOPRAZOLE 40 MG TABLET,DELAYED RELEASE
ORAL_TABLET | Freq: Every day | ORAL | 3 refills | 90.00000 days | Status: CP | PRN
Start: 2021-03-16 — End: 2022-03-16

## 2021-03-16 MED ORDER — PROMETHAZINE 25 MG TABLET
ORAL_TABLET | Freq: Four times a day (QID) | ORAL | 11 refills | 6.00000 days | Status: CP | PRN
Start: 2021-03-16 — End: ?

## 2021-03-16 NOTE — Unmapped (Addendum)
We'll check basic labs today for Crohn's disease.  We'll increase the gabapentin to 600 mg (two capsules) three times a day.  We'll start pantoprazole (Protonix) 40 mg (one tablet) once daily 30 minutes before.  We'll start Zyprexa (olanzapine) 2.5 mg (one tablet) nightly.  We'll refill the phenergan and the Viagra.  -------------------------------------------------------------------------------    If you have any questions or concerns please contact me on MyChart or call my nurse, Kim, at [phone].    For appointments, please call [phone]. If you need an urgent appointment ask on MyChart or call Kim.    For scheduling GI procedures (Upper endoscopy, colonoscopy, manometry, pH/impedence test), please call the schedulers at [phone].  For scheduling radiology exams call [phone].  You can reach the [place] lab at [phone].    For emergencies after hours you can call [phone] and ask for the GI medicine fellow on call.    ==============================================

## 2021-03-16 NOTE — Unmapped (Signed)
[facility]  Faculty Established Follow-up Visit Note    REFERRING PROVIDER: Suanne Marker, MD  [address]  [place],  [place] [address]    PRIMARY CARE PROVIDER: Suanne Marker, MD    Assessment and Plan:  Rodney Bender is a 51 y.o. 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    Crohn's disease of small intestine with other complication  Historically he has had very high risk disease with an ileocecectomy and multiple revisions of the anastomosis losing a reasonable amount of small bowel though substantially less than 50%. Non-adherence before he got married was almost certainly a factor in this for him. He struggles to maintain his weight and we have tried multiple appetite stimulants for some time, holding our own but not gaining significant weight either.  I think if we do not see signs of active disease on his labs, I am pretty confident he is adherent to his Stelara, and has had relatively recent imaging and colonoscopy, we could hold off on repeat colonoscopy at this time, and follow-up in 6 months.  I would like to get anti-TNF antibodies at that time in case he needs to switch because he has had long-term high risk disease and he is done pretty well on Stelara for quite some time, but colonoscopy looks imperfect on its not unlikely he could develop some more significant relapse at some point postsurgically now almost five years out.  - Comprehensive metabolic panel  - CBC w/ Differential  - Check anti-TNF antibodies and confirm baseline labs are in order at next visit  - Order a colonoscopy at next visit     Functional dyspepsia: He has chronic functional dyspepsia symptoms that do not seem to correlate with his IBD disease activity. Given he is having some chronic nausea today, Zyprexa might be helpful for appetite and weight gain as well as his nausea is reasonably safe.  We have tried centrally acting agents in the past but he gets better relief from gabapentin.  He is additionally having some reflux symptoms and is possible that GERD can contribute to his dyspepsia as well, so we will start a low-dose PPI today see if that gets many benefit with the symptoms.  This has been of questionable benefit in the past for dyspepsia alone, but he is having heartburn and regurgitation now.  - Add pantoprazole 40 mg once daily 30 minutes before meal  - Increase gabapentin to 600 mg 3 times daily    Rash  Dry, scaly rash on palms of both hands, not responding to moisturizers.   - Ambulatory referral to Dermatology    Dry eye  Without redness or vision changes, I think low-risk as a sequela of his Crohn's disease but not impossible. I wonder if he could benefit from therapies for the symptoms if not.  - Ambulatory referral to Ophthalmology    Patient Instructions   1. We'll check basic labs today for Crohn's disease.  2. We'll increase the gabapentin to 600 mg (two capsules) three times a day.  3. We'll start pantoprazole (Protonix) 40 mg (one tablet) once daily 30 minutes before.  4. We'll start Zyprexa (olanzapine) 2.5 mg (one tablet) nightly.  5. We'll refill the phenergan and the Viagra.  -------------------------------------------------------------------------------    If you have any questions or concerns please contact me on MyChart or call  my nurse, Selena Batten, at [phone].    For appointments, please call [phone]. If you need an urgent appointment ask on MyChart or call Kim.    For scheduling GI procedures (Upper endoscopy, colonoscopy, manometry, pH/impedence test), please call the schedulers at [phone].  For scheduling radiology exams call [phone].  You can reach the [place] lab at [phone].    For emergencies after hours you can call [phone] and ask for the GI medicine fellow on call.    ==============================================      Return in about 4 months (around 07/14/2021).    I personally spent 30 minutes face-to-face and non-face-to-face in the care of this patient, which includes all pre, intra, and post visit time on the date of service.  All documented time was specific to the E/M visit and does not include any procedures that may have been performed.     [name] C. Elizebeth Brooking, MD MPH  Clinical Assistant Professor  Gastroenterology and Hepatology     -------------------------    Chief Complaint    Follow-up     History of Present Illness:      Rodney Bender is a 51 y.o. 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    He's overall doing pretty well, having baseline 4-5 bowel movements daily, no blood. Has his typical epigastric pain, but more nausea, doesn't usually vomit but sometimes does and it's decreasing his oral intake. He is not sleeping real well, goes to be fine but sometimes wakes up early. Eyes are gritty but no pain, redness, or vision changes, note his weight is back at his baseline, up a bit from when he was feeling bad early last year. He's started having more heartburn and regurgitation, 3-4 times a week. No dysphagia or odynophagia.    Past Medical History:   Diagnosis Date   ??? Anxiety    ??? Crohn's disease (CMS-HCC)     diagnosed in 1990   ??? GERD (gastroesophageal reflux disease)    ??? Hypertension 10/08/2016     Past Surgical History:  ??? Ileocolectomy with anastomosis and multiple IR drains about 5 cm ileum and colon (09/16/2016)  ??? Ileocolectomy about 15 cm (2009)  ??? Ileocecectomy unclear extent (1998)     Current Outpatient Medications on File Prior to Visit   Medication Sig Dispense Refill   ??? citalopram (CELEXA) 10 MG tablet Take 10 mg by mouth daily as needed.     ??? dronabinoL (MARINOL) 10 MG capsule TAKE 1 CAPSULE BY MOUTH TWICE DAILY 30  MINUTES  BEFORE  A MEAL 60 capsule 0   ??? loperamide (IMODIUM A-D) 2 mg tablet Take 2 mg by mouth daily as needed.     ??? oxyCODONE-acetaminophen (PERCOCET) 10-325 mg per tablet Take 1 tablet by mouth every eight (8) hours as needed.     ??? therapeutic multivitamin (THERAGRAN) tablet Take 1 tablet by mouth daily.      ??? ustekinumab (STELARA) 90 mg/mL Syrg syringe Inject the contents of 1 syringe (90 mg total) under the skin every 8 weeks. 1 mL 5   ??? naloxone (NARCAN) 4 mg nasal spray For respiratory depression from opioids       No current facility-administered medications on file prior to visit.     Allergies  Reviewed on 03/16/2021      Reactions Comments    Tramadol Itching, Anxiety Other reaction(s): Other (See Comments)  Penicillins  Other reaction(s): Itching-Allergy    Dilaudid [hydromorphone] Anxiety     Morphine Itching           Family History   Problem Relation Age of Onset   ??? Hyperlipidemia Father    ??? Cancer Father         kidney   ??? Cancer Maternal Aunt    ??? Stroke Mother    ??? No Known Problems Sister    ??? No Known Problems Brother    ??? No Known Problems Maternal Uncle    ??? No Known Problems Paternal Aunt    ??? No Known Problems Paternal Uncle    ??? No Known Problems Maternal Grandmother    ??? No Known Problems Maternal Grandfather    ??? No Known Problems Paternal Grandmother    ??? No Known Problems Paternal Grandfather    ??? Anesthesia problems Neg Hx    ??? Broken bones Neg Hx    ??? Clotting disorder Neg Hx    ??? Collagen disease Neg Hx    ??? Diabetes Neg Hx    ??? Dislocations Neg Hx    ??? Fibromyalgia Neg Hx    ??? Gout Neg Hx    ??? Hemophilia Neg Hx    ??? Osteoporosis Neg Hx    ??? Rheumatologic disease Neg Hx    ??? Scoliosis Neg Hx    ??? Severe sprains Neg Hx    ??? Sickle cell anemia Neg Hx    ??? Spinal Compression Fracture Neg Hx      Social History     Tobacco Use   ??? Smoking status: Former     Packs/day: 1.00     Years: 18.00     Pack years: 18.00     Types: Cigarettes     Start date: 08/27/2003     Quit date: 06/06/2017     Years since quitting: 3.7   ??? Smokeless tobacco: Never   ??? Tobacco comments:     Pt smokes 1ppd, Pt is interested in tobacco cessation    Vaping Use   ??? Vaping Use: Never used   Substance Use Topics   ??? Alcohol use: No   ??? Drug use: Not Currently     Types: Marijuana     Review of Systems:  The balance of 12 systems reviewed is negative except as noted in the history of present illness.    Vital Signs: BP 107/63 (BP Site: L Arm, BP Position: Sitting, BP Cuff Size: Medium)  - Pulse 77  - Temp 36.9 ??C (98.4 ??F) (Temporal)  - Wt 62.4 kg (137 lb 9.6 oz)  - BMI 19.19 kg/m??     Wt Readings from Last 6 Encounters:   03/16/21 62.4 kg (137 lb 9.6 oz)   05/15/20 59 kg (130 lb)   04/20/20 62.1 kg (137 lb)   04/06/20 63.5 kg (140 lb)   03/10/20 63.4 kg (139 lb 12.8 oz)   08/11/19 66.7 kg (147 lb)     Review of Systems:  General appearance: Appears well, no distress and cachectic.  Eyes: Anicteric sclera. No erythema.  ENT: No oral ulcers. Posterior oropharynx unremarkable.  Cardiovascular: RRR without murmurs, heaves, or thrills. No lower extremity edema.  Pulmonary: Normal work of breathing. Acyanotic.  Abdominal: soft, tender epigastrium, nondistended, no masses or organomegaly.  Musculoskeletal: No temporal wasting. Normal joints of the hand.  Skin: No jaundice. No rashes.  Neurologic: Alert, oriented, and appropriate.  Psychiatric: Appropriate.  Data Review:  Review of labs found:  No new labs.    Review of endoscopy found:  ?? Colonoscopy - 05/15/2020 - i1 at anastomosis, SESCD 3, one 2 mm polyp  ?? Colonoscopy - 02/12/2019 - i1 at anastomosis, no more proximal ileal inflammation  ?? Colonoscopy - 04/06/2018 - i0 at anastomosis  ?? Colonoscopy - 03/21/2017 - i1 at anastomosis, one 2mm polup  ?? Colonoscopy - 08/09/2016 - occlusive ulcerated stricture at anastomosis   ?? Upper endoscopy - 07/15/2014 - nl  ?? Colonoscopy - 07/15/2014 - inflammed anastomosis dilated to 12-16mm, scattered apthae distal ileum  ?? Colonoscopy - 07/21/2013 - i0 anastomosis, nl TI, hemorrhoids  ?? Upper endoscopy - 07/21/2013 - nl  ?? Upper endoscopy - 11/10/2012 - nl  ?? Colonoscopy - 11/10/2012 - traversable anastomosis with i4 inflammation, and ulcerations in the TI  ?? Note procedures on record back to 06/12/1998    Review of imaging found:  ?? CT enterography - 04/07/2020 - scattered small bowel narrowing without adjacent inflammatory stranding  ?? CT abdomen/pelvis w contrast - 02/08/2019 - Diffuse small bowel wall thickening is seen with surrounding free fluid suggestive of active inflammation  ?? CT abdomen/pelvis w contrast - 12/20/2017 - Mucosal thickening and enhancement in the left hemiabdomen small bowel with associated engorged mesenteric vessels. Mucosal wall thickening in the sigmoid colon and rectum  ?? CT abdomen/pelvis w IV and oral contrast - 03/12/2017 - Status post ileocecectomy, unremarkable  ?? Note exams on record back to 06/16/1998  .  Review of other studies found:  None

## 2021-03-26 MED ORDER — PROMETHAZINE 25 MG TABLET
ORAL_TABLET | 0 refills | 0.00000 days
Start: 2021-03-26 — End: ?

## 2021-03-27 NOTE — Unmapped (Signed)
Promethazie re-ordered by Dr. Elizebeth Brooking to same pharmacy on 03/16/21. Refill of this not indicated at this time.

## 2021-03-28 LAB — COMPREHENSIVE METABOLIC PANEL
ALBUMIN: 4.3 g/dL (ref 3.4–5.0)
ALKALINE PHOSPHATASE: 65 U/L (ref 46–116)
ALT (SGPT): 36 U/L (ref 10–49)
ANION GAP: 6 mmol/L (ref 5–14)
AST (SGOT): 41 U/L — ABNORMAL HIGH (ref <=34)
BILIRUBIN TOTAL: 0.7 mg/dL (ref 0.3–1.2)
BLOOD UREA NITROGEN: 13 mg/dL (ref 9–23)
BUN / CREAT RATIO: 15
CALCIUM: 9.8 mg/dL (ref 8.7–10.4)
CHLORIDE: 101 mmol/L (ref 98–107)
CO2: 24.1 mmol/L (ref 20.0–31.0)
CREATININE: 0.88 mg/dL
EGFR CKD-EPI (2021) MALE: >90 mL/min/{1.73_m2} (ref >=60)
GLUCOSE RANDOM: 73 mg/dL (ref 70–179)
POTASSIUM: 4.9 mmol/L — ABNORMAL HIGH (ref 3.4–4.8)
PROTEIN TOTAL: 8.1 g/dL (ref 5.7–8.2)
SODIUM: 131 mmol/L — ABNORMAL LOW (ref 135–145)

## 2021-03-28 LAB — LIPASE: LIPASE: 68 U/L — ABNORMAL HIGH (ref 12–53)

## 2021-03-29 ENCOUNTER — Emergency Department: Admit: 2021-03-29 | Discharge: 2021-03-30 | Disposition: A | Discharge status: Home / Self Care | Payer: MEDICARE

## 2021-03-29 ENCOUNTER — Ambulatory Visit: Admit: 2021-03-29 | Discharge: 2021-03-30 | Disposition: A | Discharge status: Home / Self Care | Payer: MEDICARE

## 2021-03-29 ENCOUNTER — Ambulatory Visit: Admit: 2021-03-29 | Discharge: 2021-03-29 | Discharge status: Against Medical Advice | Payer: MEDICARE

## 2021-03-29 DIAGNOSIS — R109 Unspecified abdominal pain: Principal | ICD-10-CM

## 2021-03-29 DIAGNOSIS — R197 Diarrhea, unspecified: Principal | ICD-10-CM

## 2021-03-29 LAB — COMPREHENSIVE METABOLIC PANEL
ALBUMIN: 4.2 g/dL (ref 3.4–5.0)
ALKALINE PHOSPHATASE: 64 U/L (ref 46–116)
ALT (SGPT): 37 U/L (ref 10–49)
ANION GAP: 12 mmol/L (ref 5–14)
AST (SGOT): 40 U/L — ABNORMAL HIGH (ref <=34)
BILIRUBIN TOTAL: 0.8 mg/dL (ref 0.3–1.2)
BLOOD UREA NITROGEN: 14 mg/dL (ref 9–23)
BUN / CREAT RATIO: 17
CALCIUM: 9.7 mg/dL (ref 8.7–10.4)
CHLORIDE: 100 mmol/L (ref 98–107)
CO2: 20 mmol/L (ref 20.0–31.0)
CREATININE: 0.81 mg/dL
EGFR CKD-EPI (2021) MALE: >90 mL/min/{1.73_m2} (ref >=60)
GLUCOSE RANDOM: 68 mg/dL — ABNORMAL LOW (ref 70–179)
POTASSIUM: 4.3 mmol/L (ref 3.4–4.8)
PROTEIN TOTAL: 8.1 g/dL (ref 5.7–8.2)
SODIUM: 132 mmol/L — ABNORMAL LOW (ref 135–145)

## 2021-03-29 LAB — CBC W/ AUTO DIFF
BASOPHILS ABSOLUTE COUNT: 0.1 10*9/L (ref 0.0–0.1)
BASOPHILS RELATIVE PERCENT: 0.8 %
EOSINOPHILS ABSOLUTE COUNT: 0 10*9/L (ref 0.0–0.5)
EOSINOPHILS RELATIVE PERCENT: 0.1 %
HEMATOCRIT: 33.6 % — ABNORMAL LOW (ref 39.0–48.0)
HEMOGLOBIN: 12.2 g/dL — ABNORMAL LOW (ref 12.9–16.5)
LYMPHOCYTES ABSOLUTE COUNT: 1.6 10*9/L (ref 1.1–3.6)
LYMPHOCYTES RELATIVE PERCENT: 14 %
MEAN CORPUSCULAR HEMOGLOBIN CONC: 36.3 g/dL — ABNORMAL HIGH (ref 32.0–36.0)
MEAN CORPUSCULAR HEMOGLOBIN: 32.6 pg — ABNORMAL HIGH (ref 25.9–32.4)
MEAN CORPUSCULAR VOLUME: 89.8 fL (ref 77.6–95.7)
MEAN PLATELET VOLUME: 7.1 fL (ref 6.8–10.7)
MONOCYTES ABSOLUTE COUNT: 1 10*9/L — ABNORMAL HIGH (ref 0.3–0.8)
MONOCYTES RELATIVE PERCENT: 8.9 %
NEUTROPHILS ABSOLUTE COUNT: 9 10*9/L — ABNORMAL HIGH (ref 1.8–7.8)
NEUTROPHILS RELATIVE PERCENT: 76.2 %
PLATELET COUNT: 235 10*9/L (ref 150–450)
RED BLOOD CELL COUNT: 3.74 10*12/L — ABNORMAL LOW (ref 4.26–5.60)
RED CELL DISTRIBUTION WIDTH: 13.4 % (ref 12.2–15.2)
WBC ADJUSTED: 11.7 10*9/L — ABNORMAL HIGH (ref 3.6–11.2)

## 2021-03-29 LAB — C-REACTIVE PROTEIN: C-REACTIVE PROTEIN: 105 mg/L — ABNORMAL HIGH (ref <=10.0)

## 2021-03-29 LAB — SEDIMENTATION RATE: ERYTHROCYTE SEDIMENTATION RATE: 80 mm/h — ABNORMAL HIGH (ref 0–15)

## 2021-03-29 LAB — LIPASE: LIPASE: 78 U/L — ABNORMAL HIGH (ref 12–53)

## 2021-03-29 MED ORDER — ONDANSETRON 4 MG DISINTEGRATING TABLET
ORAL_TABLET | Freq: Three times a day (TID) | ORAL | 0 refills | 5 days | Status: CP | PRN
Start: 2021-03-29 — End: 2021-04-05

## 2021-03-29 MED ADMIN — ondansetron (ZOFRAN) injection 4 mg: 4 mg | INTRAVENOUS | Stop: 2021-03-29

## 2021-03-29 MED ADMIN — MORPhine 4 mg/mL injection 4 mg: 4 mg | INTRAVENOUS | @ 22:00:00 | Stop: 2021-03-29

## 2021-03-29 MED ADMIN — ondansetron (ZOFRAN-ODT) disintegrating tablet 4 mg: 4 mg | ORAL | @ 22:00:00 | Stop: 2021-03-29

## 2021-03-29 NOTE — Unmapped (Signed)
[facility]  Emergency Department Medical Screening Examination     Subjective     Rodney Bender is a 51 y.o. male presenting for evaluation of Abdominal Pain. The patient reports abdominal pain with associated fever, nausea, vomiting, and diarrhea. He has a history of Chron's disease, and states his symptoms feel similar to past flares. He has previous bowel resections for his Chron's disease. Additionally, the patient tested positive for Covid on 2/21.     Abbreviated Review of Systems/Covid Screen  Constitutional: Positive for fever  Respiratory: Negative for cough. Negative for difficulty breathing.    Objective     ED Triage Vitals   Enc Vitals Group      BP 03/29/21 1008 140/88      Heart Rate 03/29/21 0958 99      SpO2 Pulse --       Resp 03/29/21 1007 20      Temp 03/29/21 1007 36.8 ??C (98.2 ??F)      Temp src --       SpO2 03/29/21 0958 100 %     Focused Physical Exam  Constitutional: No acute distress.  Respiratory: Non-labored respirations.  Neurological: Clear speech. No gross focal neurologic deficits are appreciated.  ?  Assessment & Plan     STATES HE CANT TOL PO CONTRAST SO CTAP ORDERED WITH IV CONTRAST FROM TRIAGE  Appropriate triage orders have been placed. Patient is appropriate for a bed in main ED when one becomes available    A medical screening exam has been performed. At the time of this evaluation, no emergency medical condition requiring immediate stabilization has been identified nor is there suspicion for imminent decompensation. Appropriate triage protocols will be implemented and a comprehensive ED evaluation with disposition will be completed by a healthcare provider when an appropriate ED location becomes available. The patient is aware that this is an initial encounter only and verbalizes understanding and agreement with the plan.     Emergency Department operations continue to be impacted by the COVID-19 pandemic.    Documentation assistance was provided by Meryle Ready, Scribe, on March 29, 2021 at 12:05 PM for Carolee Rota, MD.    Documentation assistance was provided by the scribe in my presence.  The documentation recorded by the scribe has been reviewed by me and accurately reflects the services I personally performed.      Carolee Rota, MD  March 29, 2021 12:05 PM

## 2021-03-29 NOTE — Unmapped (Signed)
States he tested positive for COVID on 2/21.

## 2021-03-29 NOTE — Unmapped (Signed)
Crohn's flare up, N/V/D. Pt COVID+

## 2021-03-29 NOTE — Unmapped (Addendum)
Patient presents to the ED with ABD pain, black stools, +N/+V that started a couple of days ago.+COVID, H/O Crohn's, unable to keep anything down

## 2021-03-30 MED ADMIN — iohexoL (OMNIPAQUE) 350 mg iodine/mL solution 75 mL: 75 mL | INTRAVENOUS | @ 01:00:00 | Stop: 2021-03-29

## 2021-03-30 NOTE — Unmapped (Signed)
[facility]  Emergency Department Provider Note     ED Clinical Impression     Final diagnoses:   Abdominal pain, unspecified abdominal location (Primary)   Diarrhea, unspecified type        Impression, Medical Decision Making, ED Course     Impression: 51 y.o. male who has a past medical history of Anxiety, Crohn's disease (CMS-HCC), GERD (gastroesophageal reflux disease), and Hypertension (10/08/2016). who presents with abdominal pain, nausea, vomiting, diarrhea as described below.     DDx/MDM: Given the broad differential of abdominal pain, will obtain basic labs.  Lipase to evaluate for pancreatitis.  CBC to evaluate for leukocytosis or anemia.  Chemistry to evaluate for electrolyte abnormalities, acidosis or alkalosis, and liver function test to evaluate for hepato-biliary disease.  Urinalysis to evaluate for cystitis, pyelonephritis, hematuria suggest renal colic.  CT abdomen pelvis to evaluate for intra-abdominal complications of known Crohn's disease.      Diagnostic workup as below.     Orders Placed This Encounter   Procedures   ??? XR Chest 2 views   ??? CT Abdomen Pelvis W IV Contrast Only   ??? Comprehensive Metabolic Panel   ??? CBC w/ Differential   ??? C-reactive protein   ??? Sedimentation rate, manual   ??? Lipase       ED Course as of 03/29/21 2022   Thu Mar 29, 2021   1619 XR Chest 2 views  IMPRESSION:  Negative chest.    I personally reviewed the patient's chest x-ray.  No evidence of focal consolidation, pulmonary edema, pleural effusion.   1719 FOBT negative   1719 Patient has previously tolerated morphine, reports imaging that can be resolved with Benadryl would like like to proceed with this route of IV pain medication   1819 CRP(!): 105.0   1819 Sed Rate(!): 80   1819 Lipase(!): 78   1819 WBC(!): 11.7   1949 CT Abdomen Pelvis W IV Contrast Only  IMPRESSION:  ??  Multiple air-fluid levels throughout the colon, nonspecific but consistent with diarrheal illness.  ??  Infectious bronchiolitis of the right lower lobe. Follow-up CT chest in 6-8 weeks is recommended to ensure resolution  ??  *Opacifications of the abdominal aortic and paraovarian vessels, greater than expected for patient's stated age.   2000 Patient is stable for discharge.  Patient is able to tolerate p.o. intake.  Return precautions and discharge instructions with patient who expresses understanding.  Patient's nausea vomiting diarrhea is most consistent with a gastroenteritis.  Likely viral in nature given the patient's recent COVID diagnosis.  Consideration of Crohn's flare although there was no evidence of bowel wall thickening, enteric edema or any other Crohn's complications on CT.  Although there was no clear evidence of Crohn's acute flare on imaging, it is possible that the plays a role in the patient's symptoms.  We discussed that the CT showed evidence of bronchiolitis, with a recommendation for repeat chest CT in 6-8 weeks for resolution.  Patient was instructed to follow-up with PCP for further outpatient management regarding this imaging.   2003 Elevated WBC 11.7, CRP 105, sed rate 80.  Consistent with patient's diarrhea illness.  Patient elevated although no evidence of pancreatitis on CT.       Independent Interpretation of Studies: I have independently interpreted the following studies:  ??? See ED course    Discussion of Management With Other Providers or Support Staff: I discussed the management of this patient with the:  ??? None  Considerations Regarding Disposition/Escalation of Care and Critical Care:  ??? Consideration of admission, not indicated at this time.  Patient's symptoms are likely viral in nature and although patient has an underlying diagnosis of Crohn's will likely resolve outpatient.  Patient has adequate outpatient follow-up with the PCP and the GI team.  ____________________________________________    The case was discussed with the attending physician, who is in agreement with the above assessment and plan. History     Chief Complaint  Chief Complaint   Patient presents with   ??? Abdominal Pain       HPI   Rodney Bender is a 51 y.o. male with past medical history as below who presents with abdominal pain.  Reports abdominal pain for 3 days.  Reports that this feels very similar to previous Crohn's flares.  Patient reports associated nausea, vomiting, diarrhea also present for 3 days.  Patient reports unknown amount of nonbloody emesis per day.  Patient reports several episodes of dark black appearing diarrhea.  Patient reports that the dark diarrhea is different from prior Crohn's flares.  Patient denies any bright red blood per rectum.  Patient reports testing COVID-positive on 2/21, reports symptoms of chest tightness, productive cough, headache are still present at this time.  Reports taking Stelara for Crohn's, no recent changes to any medications.     Outside Historian(s): None    External Records Reviewed: None    Past Medical History:   Diagnosis Date   ??? Anxiety    ??? Crohn's disease (CMS-HCC)     diagnosed in 1990   ??? GERD (gastroesophageal reflux disease)    ??? Hypertension 10/08/2016       Past Surgical History:   Procedure Laterality Date   ??? COLON SURGERY     ??? PR COLONOSCOPY FLX DX W/COLLJ SPEC WHEN PFRMD Left 11/10/2012    Procedure: COLONOSCOPY, FLEXIBLE, PROXIMAL TO SPLENIC FLEXURE; DIAGNOSTIC, W/WO COLLECTION SPECIMEN BY BRUSH OR WASH;  Surgeon: Malcolm Metro, MD;  Location: [facility]URES [facility];  Service: Gastroenterology   ??? PR COLONOSCOPY FLX DX W/COLLJ SPEC WHEN PFRMD N/A 07/21/2013    Procedure: COLONOSCOPY, FLEXIBLE, PROXIMAL TO SPLENIC FLEXURE; DIAGNOSTIC, W/WO COLLECTION SPECIMEN BY BRUSH OR WASH;  Surgeon: Gwen Pounds, MD;  [facility]: [facility];  Service: Gastroenterology   ??? PR COLONOSCOPY FLX DX W/COLLJ SPEC WHEN PFRMD N/A 08/09/2016    Procedure: COLONOSCOPY, FLEXIBLE, PROXIMAL TO SPLENIC FLEXURE; DIAGNOSTIC, W/WO COLLECTION SPECIMEN BY BRUSH OR WASH; Surgeon: Janyth Pupa, MD;  Location: [facility]URES [facility];  Service: Gastroenterology   ??? PR COLONOSCOPY FLX DX W/COLLJ SPEC WHEN PFRMD N/A 04/06/2018    Procedure: COLONOSCOPY, FLEXIBLE, PROXIMAL TO SPLENIC FLEXURE; DIAGNOSTIC, W/WO COLLECTION SPECIMEN BY BRUSH OR WASH;  Surgeon: Zetta Bills, MD;  [facility];  Service: Gastroenterology   ??? PR COLONOSCOPY W/BIOPSY SINGLE/MULTIPLE  07/23/2012    Procedure: COLONOSCOPY, FLEXIBLE, PROXIMAL TO SPLENIC FLEXURE; WITH BIOPSY, SINGLE OR MULTIPLE;  Surgeon: Vickii Chafe, MD;  Location: [facility];  Service: Gastroenterology   ??? PR COLONOSCOPY W/BIOPSY SINGLE/MULTIPLE N/A 07/15/2014    Procedure: COLONOSCOPY, FLEXIBLE, PROXIMAL TO SPLENIC FLEXURE; WITH BIOPSY, SINGLE OR MULTIPLE;  Surgeon: Janyth Pupa, MD;  Location: [facility];  Service: Gastroenterology   ??? PR COLONOSCOPY W/BIOPSY SINGLE/MULTIPLE N/A 03/21/2017    Procedure: COLONOSCOPY, FLEXIBLE, PROXIMAL TO SPLENIC FLEXURE; WITH BIOPSY, SINGLE OR MULTIPLE;  Surgeon: Modena Nunnery, MD;  [facility];  Service: Gastroenterology   ??? PR COLONOSCOPY W/BIOPSY SINGLE/MULTIPLE N/A 02/12/2019    Procedure: COLONOSCOPY, FLEXIBLE, PROXIMAL TO SPLENIC FLEXURE; WITH BIOPSY, SINGLE OR MULTIPLE;  Surgeon: Jules Husbands, MD;  Location: [facility];  Service: Gastroenterology   ??? PR COLONOSCOPY W/BIOPSY SINGLE/MULTIPLE Left 05/15/2020    Procedure: COLONOSCOPY, FLEXIBLE, PROXIMAL TO SPLENIC FLEXURE; WITH BIOPSY, SINGLE OR MULTIPLE;  Surgeon: Rona Ravens, MD;  [facility];  Service: Gastroenterology   ??? PR COLSC FLEXIBLE W/TRANSENDOSCOPIC BALLOON DILAT N/A 07/15/2014    Procedure: COLONOSCOPY, FLEXIBLE; WITH DILATION BY BALLOON, 1 OR MORE STRICTURES;  Surgeon: Janyth Pupa, MD;  Location: [facility];  Service: Gastroenterology   ??? PR REMVL COLON & TERM ILEUM W/ILEOCOLOSTOMY N/A 09/16/2016    Procedure: COLECTOMY, PARTIAL, WITH REMOVAL OF TERMINAL ILEUM WITH ILEOCOLOSTOMY;  Surgeon: Lady Gary, MD;  Location: MAIN OR [facility];  Service: Gastrointestinal   ??? PR REPAIR BICEPS LONG TENDON Right 03/19/2019    Procedure: TENODESIS LONG TENDON BICEPS;  Surgeon: Gonzella Lex, MD;  Location: ASC OR [facility];  Service: Orthopedics   ??? PR SHLDR ARTHROSCOP,PART ACROMIOPLAS Right 10/02/2018    Procedure: R16 ARTHROSCOPY, SHOULDER, SURGICAL; DECOMPRESS SUBACROMIAL SPACE W/PART ACROMIOPLASTY, Tamala Bari;  Surgeon: Gonzella Lex, MD;  Location: ASC OR [facility];  Service: Orthopedics   ??? PR SHLDR ARTHROSCOP,SURG,W/ROTAT CUFF REPR Right 10/02/2018    Procedure: ARTHROSCOPY, SHOULDER, SURGICAL; WITH ROTATOR CUFF REPAIR;  Surgeon: Gonzella Lex, MD;  Location: ASC OR [facility];  Service: Orthopedics   ??? PR SHLDR ARTHROSCOP,SURG,W/ROTAT CUFF REPR Right 03/19/2019    Procedure: R16 ARTHROSCOPY, SHOULDER, SURGICAL; WITH ROTATOR CUFF REPAIR;  Surgeon: Gonzella Lex, MD;  Location: ASC OR [facility];  Service: Orthopedics   ??? PR UPPER GI ENDOSCOPY,BIOPSY N/A 07/23/2012    Procedure: UGI ENDOSCOPY; WITH BIOPSY, SINGLE OR MULTIPLE;  Surgeon: Vickii Chafe, MD;  Location: [facility]URES [facility];  Service: Gastroenterology   ??? PR UPPER GI ENDOSCOPY,DIAGNOSIS N/A 11/10/2012    Procedure: UGI ENDO, INCLUDE ESOPHAGUS, STOMACH, & DUODENUM &/OR JEJUNUM; DX W/WO COLLECTION SPECIMN, BY BRUSH OR WASH;  Surgeon: Malcolm Metro, MD;  [facility]: [facility];  Service: Gastroenterology   ??? PR UPPER GI ENDOSCOPY,DIAGNOSIS N/A 07/21/2013    Procedure: UGI ENDO, INCLUDE ESOPHAGUS, STOMACH, & DUODENUM &/OR JEJUNUM; DX W/WO COLLECTION SPECIMN, BY BRUSH OR WASH;  Surgeon: Gwen Pounds, MD;  Location: [facility]URES [facility];  Service: Gastroenterology   ??? PR UPPER GI ENDOSCOPY,DIAGNOSIS N/A 07/15/2014    Procedure: UGI ENDO, INCLUDE ESOPHAGUS, STOMACH, & DUODENUM &/OR JEJUNUM; DX W/WO COLLECTION SPECIMN, BY BRUSH OR WASH;  Surgeon: Janyth Pupa, MD;  [facility]: [facility];  Service: Gastroenterology   ??? ROTATOR CUFF REPAIR Right 2020   ??? SHOULDER SURGERY     ??? SPINE SURGERY         No current facility-administered medications for this encounter.    Current Outpatient Medications:   ???  citalopram (CELEXA) 10 MG tablet, Take 10 mg by mouth daily as needed., Disp: , Rfl:   ???  dronabinoL (MARINOL) 10 MG capsule, TAKE 1 CAPSULE BY MOUTH TWICE DAILY 30  MINUTES  BEFORE  A  MEAL, Disp: 60 capsule, Rfl: 0  ???  gabapentin (NEURONTIN) 300 MG capsule, Take 2 capsules (600 mg total) by mouth Three (3) times a day., Disp: 180 capsule, Rfl: 11  ???  loperamide (IMODIUM A-D) 2 mg tablet, Take 2 mg by  mouth daily as needed., Disp: , Rfl:   ???  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  OLANZapine (ZYPREXA) 2.5 MG tablet, Take 1 tablet (2.5 mg total) by mouth nightly., Disp: 30 tablet, Rfl: 0  ???  ondansetron (ZOFRAN-ODT) 4 MG disintegrating tablet, Take 1 tablet (4 mg total) by mouth every eight (8) hours as needed for nausea for up to 7 days., Disp: 14 tablet, Rfl: 0  ???  oxyCODONE-acetaminophen (PERCOCET) 10-325 mg per tablet, Take 1 tablet by mouth every eight (8) hours as needed., Disp: , Rfl:   ???  pantoprazole (PROTONIX) 40 MG tablet, Take 1 tablet (40 mg total) by mouth daily as needed., Disp: 90 tablet, Rfl: 3  ???  promethazine (PHENERGAN) 25 MG tablet, Take 2 tablets (50 mg total) by mouth every six (6) hours as needed for nausea., Disp: 45 tablet, Rfl: 11  ???  sildenafiL (VIAGRA) 50 MG tablet, Take 1 tablet (50 mg total) by mouth daily as needed for erectile dysfunction., Disp: 30 tablet, Rfl: 5  ???  therapeutic multivitamin (THERAGRAN) tablet, Take 1 tablet by mouth daily. , Disp: , Rfl:   ???  ustekinumab (STELARA) 90 mg/mL Syrg syringe, Inject the contents of 1 syringe (90 mg total) under the skin every 8 weeks., Disp: 1 mL, Rfl: 5    Allergies  Tramadol, Penicillins, Dilaudid [hydromorphone], and Morphine    Family History  Family History   Problem Relation Age of Onset   ??? Hyperlipidemia Father    ??? Cancer Father         kidney   ??? Cancer Maternal Aunt    ??? Stroke Mother    ??? No Known Problems Sister    ??? No Known Problems Brother    ??? No Known Problems Maternal Uncle    ??? No Known Problems Paternal Aunt    ??? No Known Problems Paternal Uncle    ??? No Known Problems Maternal Grandmother    ??? No Known Problems Maternal Grandfather    ??? No Known Problems Paternal Grandmother    ??? No Known Problems Paternal Grandfather    ??? Anesthesia problems Neg Hx    ??? Broken bones Neg Hx    ??? Clotting disorder Neg Hx    ??? Collagen disease Neg Hx    ??? Diabetes Neg Hx    ??? Dislocations Neg Hx    ??? Fibromyalgia Neg Hx    ??? Gout Neg Hx    ??? Hemophilia Neg Hx    ??? Osteoporosis Neg Hx    ??? Rheumatologic disease Neg Hx    ??? Scoliosis Neg Hx    ??? Severe sprains Neg Hx    ??? Sickle cell anemia Neg Hx    ??? Spinal Compression Fracture Neg Hx        Social History  Social History     Tobacco Use   ??? Smoking status: Former     Packs/day: 1.00     Years: 18.00     Pack years: 18.00     Types: Cigarettes     Start date: 08/27/2003     Quit date: 06/06/2017     Years since quitting: 3.8   ??? Smokeless tobacco: Never   ??? Tobacco comments:     Pt smokes 1ppd, Pt is interested in tobacco cessation    Vaping Use   ??? Vaping Use: Never used   Substance Use Topics   ???  Alcohol use: No   ??? Drug use: Not Currently     Types: Marijuana        Physical Exam     VITAL SIGNS:      Vitals:    03/29/21 1340 03/29/21 1655 03/29/21 1900 03/29/21 2000   BP: 127/77 145/92 131/79 127/77   Pulse: 62 68  70   Resp: 16      Temp:  36.6 ??C (97.8 ??F)  36.7 ??C (98 ??F)   TempSrc:  Oral  Oral   SpO2: 99% 97% 96% 97%       Constitutional: Alert and oriented. No acute distress.  Eyes: Conjunctivae are normal.  HEENT: Normocephalic and atraumatic. Conjunctivae clear. No congestion. Moist mucous membranes.   Cardiovascular: Rate as above, regular rhythm. Normal and symmetric distal pulses. Brisk capillary refill. Normal skin turgor.  Respiratory: Normal respiratory effort. Breath sounds are normal. There are no wheezing or crackles heard.  Gastrointestinal: Soft, non-distended, diffusely tender palpation, well-healed surgical scars noted  Genitourinary: Deferred.  Musculoskeletal: Non-tender with normal range of motion in all extremities.  Neurologic: Normal speech and language. No gross focal neurologic deficits are appreciated. Patient is moving all extremities equally, face is symmetric at rest and with speech.  Skin: Skin is warm, dry and intact. No rash noted.     Radiology     CT Abdomen Pelvis W IV Contrast Only   Final Result      Multiple air-fluid levels throughout the colon, nonspecific but consistent with diarrheal illness.      Infectious bronchiolitis of the right lower lobe. Follow-up CT chest in 6-8 weeks is recommended to ensure resolution      ++++++++++++++++++++   MODIFIED REPORT:    (03/29/2021 7:59 PM)   This report has been modified from its preliminary version; you may check the prior versions of radiology report, results history link for prior report versions.       -----------------------------------------------      XR Chest 2 views   Final Result      Negative chest.          Pertinent labs & imaging results that were available during my care of the patient were independently interpreted by me and considered in my medical decision making (see chart for details).    Portions of this record have been created using Scientist, clinical (histocompatibility and immunogenetics). Dictation errors have been sought, but may not have been identified and corrected.    Cheryle Horsfall, D.O.  PGY1 [facility] Emergency Medicine       Cheryle Horsfall, MD  Resident  03/29/21 [phone]

## 2021-04-03 DIAGNOSIS — K5000 Crohn's disease of small intestine without complications: Principal | ICD-10-CM

## 2021-04-03 MED ORDER — STELARA 90 MG/ML SUBCUTANEOUS SYRINGE
SUBCUTANEOUS | 5 refills | 56 days
Start: 2021-04-03 — End: ?

## 2021-04-03 NOTE — Unmapped (Signed)
[facility] Clinical Assessment & Refill Coordination Note    Rodney Bender, Rodney Bender: November 23, 1970  Phone: [phone] (home)     All above HIPAA information was verified with patient.     Was a Nurse, learning disability used for this call? No    Specialty Medication(s):   Inflammatory Disorders: Stelara     Current Outpatient Medications   Medication Sig Dispense Refill   ??? dronabinoL (MARINOL) 10 MG capsule TAKE 1 CAPSULE BY MOUTH TWICE DAILY 30  MINUTES  BEFORE  A  MEAL 60 capsule 0   ??? gabapentin (NEURONTIN) 300 MG capsule Take 2 capsules (600 mg total) by mouth Three (3) times a day. 180 capsule 11   ??? loperamide (IMODIUM A-D) 2 mg tablet Take 2 mg by mouth daily as needed.     ??? 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 OLANZapine (ZYPREXA) 2.5 MG tablet Take 1 tablet (2.5 mg total) by mouth nightly. 30 tablet 0   ??? ondansetron (ZOFRAN-ODT) 4 MG disintegrating tablet Take 1 tablet (4 mg total) by mouth every eight (8) hours as needed for nausea for up to 7 days. 14 tablet 0   ??? oxyCODONE-acetaminophen (PERCOCET) 10-325 mg per tablet Take 1 tablet by mouth every eight (8) hours as needed.     ??? pantoprazole (PROTONIX) 40 MG tablet Take 1 tablet (40 mg total) by mouth daily as needed. 90 tablet 3   ??? promethazine (PHENERGAN) 25 MG tablet Take 2 tablets (50 mg total) by mouth every six (6) hours as needed for nausea. 45 tablet 11   ??? sildenafiL (VIAGRA) 50 MG tablet Take 1 tablet (50 mg total) by mouth daily as needed for erectile dysfunction. 30 tablet 5   ??? therapeutic multivitamin (THERAGRAN) tablet Take 1 tablet by mouth daily.      ??? citalopram (CELEXA) 10 MG tablet Take 10 mg by mouth daily as needed.     ??? ustekinumab (STELARA) 90 mg/mL Syrg syringe Inject the contents of 1 syringe (90 mg total) under the skin every 8 weeks. 1 mL 5     No current facility-administered medications for this visit.        Changes to medications: Rodney Bender reports no changes at this time.    Allergies   Allergen Reactions   ??? Tramadol Itching and Anxiety     Other reaction(s): Other (See Comments)   ??? Penicillins      Other reaction(s): Itching-Allergy   ??? Dilaudid [Hydromorphone] Anxiety   ??? Morphine Itching       Changes to allergies: No    SPECIALTY MEDICATION ADHERENCE     Stelara 90 mg/ml: 0 days of medicine on hand     Medication Adherence    Patient reported X missed doses in the last month: 0  Specialty Medication: Stelara 90 mg/mL - every 8 weeks  Patient is on additional specialty medications: No  Informant: patient          Specialty medication(s) dose(s) confirmed: Regimen is correct and unchanged.     Are there any concerns with adherence? No    Adherence counseling provided? Not needed    CLINICAL MANAGEMENT AND INTERVENTION      Clinical Benefit Assessment:    Do you feel the medicine is effective or helping your condition? Yes    Clinical Benefit counseling provided? Progress note from 2/17 shows evidence of clinical benefit    Adverse Effects Assessment:  Are you experiencing any side effects? No    Are you experiencing difficulty administering your medicine? No    Quality of Life Assessment:    Quality of Life    Rheumatology  Oncology  Dermatology  Cystic Fibrosis          How many days over the past month did your crohn's disease  keep you from your normal activities? For example, brushing your teeth or getting up in the morning. Every day - Rodney Bender experiences abdominal pain every day that is managed with percocet PRN.    Have you discussed this with your provider? Yes    Acute Infection Status:    Acute infections noted within Epic:  No active infections  Patient reported infection: 3/2 tested positive for COVID - he reports his symptoms have slightly improved    Therapy Appropriateness:    Is therapy appropriate and patient progressing towards therapeutic goals? Yes, I informed him to not take Stelara if COVID symptoms were still present by the time his next dose is due and to follow-up with GI provider for further management.    DISEASE/MEDICATION-SPECIFIC INFORMATION      For patients on injectable medications: Patient currently has 0 doses left.  Next injection is scheduled for 3/20.    PATIENT SPECIFIC NEEDS     - Does the patient have any physical, cognitive, or cultural barriers? No    - Is the patient high risk? No    - Does the patient require a Care Management Plan? No     SOCIAL DETERMINANTS OF HEALTH     At the [facility], we have learned that life circumstances - like trouble affording food, housing, utilities, or transportation can affect the health of many of our patients.   That is why we wanted to ask: are you currently experiencing any life circumstances that are negatively impacting your health and/or quality of life? Yes - he states he is having trouble paying utility bills and rent/morgage. He states he has already spoken with someone who gave him resources in his community but when he reached out, those resources did not have any funding available.     Social Determinants of Health     Food Insecurity: Food Insecurity Present   ??? Worried About Programme researcher, broadcasting/film/video in the Last Year: Often true   ??? Ran Out of Food in the Last Year: Often true   Tobacco Use: Medium Risk   ??? Smoking Tobacco Use: Former   ??? Smokeless Tobacco Use: Never   ??? Passive Exposure: Not on file   Transportation Needs: Not on file   Alcohol Use: Not on file   Housing/Utilities: High Risk   ??? Within the past 12 months, have you ever stayed: outside, in a car, in a tent, in an overnight shelter, or temporarily in someone else's home (i.e. couch-surfing)?: Yes   ??? Are you worried about losing your housing?: No   ??? Within the past 12 months, have you been unable to get utilities (heat, electricity) when it was really needed?: Yes   Substance Use: Not on file   Financial Resource Strain: Not on file   Physical Activity: Not on file   Health Literacy: Low Risk    ??? : Never Stress: Not on file   Intimate Partner Violence: Not on file   Depression: Not on file   Social Connections: Not on file       Would you be willing to receive  help with any of the needs that you have identified today? No       SHIPPING     Specialty Medication(s) to be Shipped:   Inflammatory Disorders: Stelara    Other medication(s) to be shipped: No additional medications requested for fill at this time     Changes to insurance: No    Delivery Scheduled: Yes, Expected medication delivery date: 04/12/21.  However, Rx request for refills was sent to the provider as there are none remaining.     Medication will be delivered via Same Day Courier to the confirmed prescription address in [facility].    The patient will receive a drug information handout for each medication shipped and additional FDA Medication Guides as required.  Verified that patient has previously received a Conservation officer, historic buildings and a Surveyor, mining.    The patient or caregiver noted above participated in the development of this care plan and knows that they can request review of or adjustments to the care plan at any time.      All of the patient's questions and concerns have been addressed.    Oliva Bustard   [facility] Specialty Pharmacist

## 2021-04-05 MED ORDER — USTEKINUMAB 90 MG/ML SUBCUTANEOUS SYRINGE
SUBCUTANEOUS | 5 refills | 56 days | Status: CP
Start: 2021-04-05 — End: ?
  Filled 2021-04-12: qty 1, 56d supply, fill #0

## 2021-04-10 MED ORDER — DRONABINOL 10 MG CAPSULE
ORAL_CAPSULE | 0 refills | 0 days
Start: 2021-04-10 — End: ?

## 2021-04-11 MED ORDER — OLANZAPINE 2.5 MG TABLET
ORAL_TABLET | 0 refills | 0 days
Start: 2021-04-11 — End: ?

## 2021-04-11 MED ORDER — DRONABINOL 10 MG CAPSULE
ORAL_CAPSULE | 0 refills | 0 days
Start: 2021-04-11 — End: ?

## 2021-04-12 MED ORDER — DRONABINOL 10 MG CAPSULE
ORAL_CAPSULE | 0 refills | 0 days | Status: CP
Start: 2021-04-12 — End: ?

## 2021-04-16 MED ORDER — OLANZAPINE 2.5 MG TABLET
ORAL_TABLET | Freq: Every evening | ORAL | 0 refills | 30 days | Status: CP
Start: 2021-04-16 — End: 2021-05-16

## 2021-04-16 MED ORDER — GABAPENTIN 300 MG CAPSULE
ORAL_CAPSULE | Freq: Three times a day (TID) | ORAL | 11 refills | 30 days | Status: CP
Start: 2021-04-16 — End: 2022-04-16

## 2021-04-17 MED ORDER — OLANZAPINE 2.5 MG TABLET
ORAL_TABLET | Freq: Every evening | ORAL | 0 refills | 30 days | Status: CP
Start: 2021-04-17 — End: 2021-05-17

## 2021-04-17 MED ORDER — GABAPENTIN 300 MG CAPSULE
ORAL_CAPSULE | Freq: Three times a day (TID) | ORAL | 11 refills | 30 days | Status: CP
Start: 2021-04-17 — End: 2022-04-17

## 2021-04-20 MED ORDER — OLANZAPINE 2.5 MG TABLET
ORAL_TABLET | 0 refills | 0 days
Start: 2021-04-20 — End: ?

## 2021-04-20 NOTE — Unmapped (Signed)
Ordered by Dr. Elizebeth Brooking 3/21-duplicate request

## 2021-05-02 LAB — TOXASSURE SELECT 13 (MW), URINE

## 2021-05-07 ENCOUNTER — Encounter: Payer: Self-pay | Admitting: Anesthesiology

## 2021-05-07 ENCOUNTER — Ambulatory Visit: Payer: Medicare Other | Attending: Anesthesiology | Admitting: Anesthesiology

## 2021-05-07 DIAGNOSIS — K5090 Crohn's disease, unspecified, without complications: Secondary | ICD-10-CM

## 2021-05-07 DIAGNOSIS — G894 Chronic pain syndrome: Secondary | ICD-10-CM

## 2021-05-07 DIAGNOSIS — G8929 Other chronic pain: Secondary | ICD-10-CM

## 2021-05-07 DIAGNOSIS — M47816 Spondylosis without myelopathy or radiculopathy, lumbar region: Secondary | ICD-10-CM

## 2021-05-07 DIAGNOSIS — M542 Cervicalgia: Secondary | ICD-10-CM

## 2021-05-07 DIAGNOSIS — R109 Unspecified abdominal pain: Secondary | ICD-10-CM

## 2021-05-07 DIAGNOSIS — R200 Anesthesia of skin: Secondary | ICD-10-CM

## 2021-05-07 DIAGNOSIS — M549 Dorsalgia, unspecified: Secondary | ICD-10-CM

## 2021-05-07 DIAGNOSIS — M25511 Pain in right shoulder: Secondary | ICD-10-CM

## 2021-05-07 DIAGNOSIS — F1190 Opioid use, unspecified, uncomplicated: Secondary | ICD-10-CM

## 2021-05-07 DIAGNOSIS — M5412 Radiculopathy, cervical region: Secondary | ICD-10-CM

## 2021-05-07 DIAGNOSIS — M5020 Other cervical disc displacement, unspecified cervical region: Secondary | ICD-10-CM

## 2021-05-07 MED ORDER — OXYCODONE-ACETAMINOPHEN 10-325 MG PO TABS: 1.0000 | ORAL_TABLET | Freq: Three times a day (TID) | ORAL | 0 refills | Status: DC | PRN

## 2021-05-07 MED ORDER — OXYCODONE-ACETAMINOPHEN 10-325 MG PO TABS: 1.0000 | ORAL_TABLET | Freq: Three times a day (TID) | ORAL | 0 refills | Status: AC | PRN

## 2021-05-07 MED ORDER — GABAPENTIN 300 MG PO CAPS: 300.0000 mg | ORAL_CAPSULE | Freq: Three times a day (TID) | ORAL | 3 refills | Status: AC

## 2021-05-07 MED ORDER — DRONABINOL 10 MG CAPSULE
ORAL_CAPSULE | 0 refills | 0 days | Status: CP
Start: 2021-05-07 — End: ?

## 2021-05-07 NOTE — Progress Notes (Signed)
,Virtual Visit via Telephone Note ? ?I connected with Benjamin Murillo on 05/07/21 at 11:40 AM EDT by telephone and verified that I am speaking with the correct person using two identifiers. ? ?Location: ?Patient: Home ?Provider: Pain control center ?  ?I discussed the limitations, risks, security and privacy concerns of performing an evaluation and management service by telephone and the availability of in person appointments. I also discussed with the patient that there may be a patient responsible charge related to this service. The patient expressed understanding and agreed to proceed. ? ? ?History of Present Illness: ?I spoke with the patient in the neuro via telephone as he was unable to do the video portion of the conference.  He reports that his back pain and neck pain and arm pain have been stable.  No recent changes are noted.  He is taking his medications as prescribed and these continue to give him good relief from his arthritic pains as well has his abdominal pain with chronic Crohn's.  He still taking Marinol for his Crohn's as well.  He denies any diverting or illicit use.  He feels that everything has been aggravated recently as his mother recently passed with her funeral yesterday.  He feels that this has aggravated everything with the associated stress.  He will be 1 day early on his opioid refill.  He has been taking gabapentin 3 times a day and this was also helping with the neuralgic component of his pain and working well for him.  None of these are causing any side effects.  Otherwise he has been in his usual state of health.  He is doing stretching strengthening exercises as tolerated and trying to stay active. ? ?Review of systems: ?General: No fevers or chills ?Pulmonary: No shortness of breath or dyspnea ?Cardiac: No angina or palpitations or lightheadedness ?GI: No abdominal pain or constipation ?Psych: No depression  ?  ?Observations/Objective: ? ?Current Outpatient Medications:  ?   [START ON 06/06/2021] oxyCODONE-acetaminophen (PERCOCET) 10-325 MG tablet, Take 1 tablet by mouth every 8 (eight) hours as needed for pain., Disp: 75 tablet, Rfl: 0 ?  amlodipine-atorvastatin (CADUET) 10-10 MG tablet, Take 1 tablet by mouth daily., Disp: , Rfl:  ?  citalopram (CELEXA) 10 MG tablet, Take 10 mg by mouth daily as needed., Disp: , Rfl:  ?  dronabinol (MARINOL) 10 MG capsule, Take 10 mg by mouth 2 (two) times daily before a meal., Disp: , Rfl:  ?  famotidine (PEPCID) 20 MG tablet, Take 20 mg by mouth 2 (two) times daily., Disp: , Rfl:  ?  gabapentin (NEURONTIN) 100 MG capsule, Take 1 capsule (100 mg total) by mouth at bedtime., Disp: 30 capsule, Rfl: 3 ?  gabapentin (NEURONTIN) 300 MG capsule, Take 1 capsule (300 mg total) by mouth 3 (three) times daily., Disp: 90 capsule, Rfl: 3 ?  hyoscyamine (LEVSIN, ANASPAZ) 0.125 MG tablet, Take 0.125 mg by mouth as needed. , Disp: , Rfl:  ?  loperamide (IMODIUM A-D) 2 MG tablet, Take 2 mg by mouth as needed., Disp: , Rfl:  ?  naloxone (NARCAN) nasal spray 4 mg/0.1 mL, For respiratory depression from opioids, Disp: 1 kit, Rfl: 2 ?  oxyCODONE-acetaminophen (PERCOCET) 10-325 MG tablet, Take 1 tablet by mouth every 8 (eight) hours as needed for pain., Disp: 75 tablet, Rfl: 0 ?  predniSONE (STERAPRED UNI-PAK 21 TAB) 10 MG (21) TBPK tablet, 6 tablets day one and day two, five tablets day three and day four, and daily taper  over 12days as directed, Disp: 42 tablet, Rfl: 0 ?  prochlorperazine (COMPAZINE) 5 MG tablet, Take 5 mg by mouth every 8 (eight) hours as needed., Disp: , Rfl:  ?  STELARA 90 MG/ML SOSY injection, Inject 90 mg as directed., Disp: , Rfl:   ? ?Past Medical History:  ?Diagnosis Date  ? Cervical radiculitis 12/22/2019  ? Cervical radiculopathy   ? Chronic abdominal pain   ? Chronic neck pain   ? Cold   ? Crohn disease ([place])   ? GERD (gastroesophageal reflux disease)   ? no meds-2-3 times weekly  ? Hand fracture, left   ? Headache(784.0)   ? Hypertension   ?  no meds   ? IBS (irritable bowel syndrome)   ? Shortness of breath   ? "sometimes laying down; sometimes w/activity" (10/13/2012)  ?  ? ?Assessment and Plan: ?1. Crohn's disease without complication, unspecified gastrointestinal tract location [facility])   ?2. Chronic pain syndrome   ?3. Chronic, continuous use of opioids   ?4. Cervicalgia   ?5. Chronic neck and back pain   ?6. Facet arthritis of lumbar region   ?7. Chronic abdominal pain   ?8. Cervical radiculitis   ?9. Right arm numbness   ?10. Acute pain of right shoulder   ?11. HNP (herniated nucleus pulposus), cervical   ?Based on our discussion today and after review of the [facility] practitioner database information I think it is appropriate to refill his medicines.  He has been compliant with the regimen and continues to get good relief from his medications.  He denies any diverting or illicit use and has been compliant.  His current is currently managed by his GI doctors and he takes Marinol for this which is present on his most recent urine screen.  No other changes are initiated today.  I have requested that he continue with stretching strengthening exercises and we will have him return to clinic in 2 months and he is to continue follow-up with his primary care physicians for baseline medical care as discussed. ? ?Follow Up Instructions: ? ?  ?I discussed the assessment and treatment plan with the patient. The patient was provided an opportunity to ask questions and all were answered. The patient agreed with the plan and demonstrated an understanding of the instructions. ?  ?The patient was advised to call back or seek an in-person evaluation if the symptoms worsen or if the condition fails to improve as anticipated. ? ?I provided 30 minutes of non-face-to-face time during this encounter. ? ? ?[name], MD  ?

## 2021-05-10 MED ORDER — GABAPENTIN 300 MG CAPSULE
ORAL_CAPSULE | Freq: Three times a day (TID) | ORAL | 11 refills | 30 days | Status: CP
Start: 2021-05-10 — End: 2022-05-10

## 2021-05-10 MED ORDER — DRONABINOL 10 MG CAPSULE
ORAL_CAPSULE | 0 refills | 0.00000 days
Start: 2021-05-10 — End: ?

## 2021-05-12 MED ORDER — OLANZAPINE 2.5 MG TABLET
ORAL_TABLET | Freq: Every evening | ORAL | 0 refills | 30 days
Start: 2021-05-12 — End: 2021-06-11

## 2021-05-12 MED ORDER — DRONABINOL 10 MG CAPSULE
ORAL_CAPSULE | 0 refills | 0 days
Start: 2021-05-12 — End: ?

## 2021-05-14 MED ORDER — OLANZAPINE 2.5 MG TABLET
ORAL_TABLET | Freq: Every evening | ORAL | 0 refills | 30 days | Status: CP
Start: 2021-05-14 — End: 2021-06-13

## 2021-05-14 MED ORDER — DRONABINOL 10 MG CAPSULE
ORAL_CAPSULE | 0 refills | 0.00000 days
Start: 2021-05-14 — End: ?

## 2021-05-14 NOTE — Unmapped (Signed)
Duplicate Marinol declined, filled on 4/10

## 2021-05-17 NOTE — Unmapped (Signed)
Abstraction Result Flowsheet Data    This patient's last AWV date: [facility] Last Medicare Wellness Visit Date: Not Found  This patients last WCC/CPE date: : Not Found      Reason for Encounter  Reason for Encounter: Outreach  Primary Reason for Outreach: Routine Follow-Up Appointment  Text Message: Yes (CALLED PT TO SCHEDULE RETURN APPT WITH PCP, LAST SEEN 03/03/20)  MyChart Message: No

## 2021-05-18 MED ORDER — PROMETHAZINE 25 MG TABLET
ORAL_TABLET | Freq: Four times a day (QID) | ORAL | 11 refills | 6 days | PRN
Start: 2021-05-18 — End: ?

## 2021-05-21 MED ORDER — PROMETHAZINE 25 MG TABLET
ORAL_TABLET | Freq: Four times a day (QID) | ORAL | 11 refills | 6 days | Status: CP | PRN
Start: 2021-05-21 — End: ?

## 2021-05-23 ENCOUNTER — Ambulatory Visit
Admit: 2021-05-23 | Discharge: 2021-05-24 | Payer: MEDICARE | Attending: Student in an Organized Health Care Education/Training Program | Primary: Student in an Organized Health Care Education/Training Program

## 2021-05-23 DIAGNOSIS — H5213 Myopia, bilateral: Principal | ICD-10-CM

## 2021-05-23 DIAGNOSIS — H04129 Dry eye syndrome of unspecified lacrimal gland: Principal | ICD-10-CM

## 2021-05-23 DIAGNOSIS — H52203 Unspecified astigmatism, bilateral: Principal | ICD-10-CM

## 2021-05-23 NOTE — Unmapped (Signed)
51 y.o. male with a PMH of Chron's disease and past ocular history of ocular trauma OS who presents for evaluation of dry eye both eyes.       Assessment/Plan:    #. Dry eye syndrome/MGD  - prior tx: OTC unknown eye drops once a day  - current tx: OTC unknown eye drops once a day  - start ATs QID  - start WCs and LS BID   - instructions provided    RTC 1 year sooner PRN if no improvement in symptoms    #. Myopia both eyes:  - New Mrx given today    #. Endothelial Central BiLinear Opacity OS:  - PPMD like appearance but unilateral and + hx trauma  - Poked in left eye with nail 2 years ago  - Monitor    # Atrophic hole OD:  - W/ surrounding pigment  - Asymptomatic  - Monitor    I was present with resident during the history and exam.  I discussed the findings, assessment, and plan with the resident and agree with the findings and plan as documented in the resident's note.

## 2021-05-23 NOTE — Unmapped (Signed)
#   Start warm compresses over both eyes twice a day for 10-20 minutes each time. You can use a wash clothe soaked in warm water, or make a dry heat compress. I like to make my own heatable rice bag. Put uncooked rice in a clean sock and tie it off. Microwave 10-15 seconds until warm and place over the eyelid. This is reusable and can be microwaved over and over. You can also purchase a Visual merchandiser on [employer] or at [employer] or any drug store. The Bruder mask is microwavable and stays warm for 10 minutes.          # Try eyelid scrubs. Clean your eyelids with baby shampoo or you may purchase eyelid cleaners at your pharmacy. Or you can use   Ocusoft lid scrub plus.          # Use preservative artificial tears starting with 4-6 times a day and increasing up to every 1-2 hours as needed. Do not use anything with the label redness relief or get the red out. Brands I like include Soothe, Refresh, Thera-Tears, and Systane.  You can look for products similar to those below at your drug store or pharmacy:              # Use lubricating ointment at night. There are many brands, but I like Genteal and Refresh PM. This ointment will blur your vision but that is why you use it right before bed.

## 2021-05-29 NOTE — Unmapped (Signed)
[facility] Refill Coordination Note    Specialty Medication(s) to be Shipped:   Inflammatory Disorders: Stelara    Other medication(s) to be shipped: No additional medications requested for fill at this time     Rodney Bender, DOB: [birth year]  Phone: [phone] (home)       All above HIPAA information was verified with patient.     Was a Nurse, learning disability used for this call? No    Completed refill call assessment today to schedule patient's medication shipment from the [facility] [phone]).  All relevant notes have been reviewed.     Specialty medication(s) and dose(s) confirmed: Regimen is correct and unchanged.   Changes to medications: Rodney Bender reports no changes at this time.  Changes to insurance: No  New side effects reported not previously addressed with a pharmacist or physician: None reported  Questions for the pharmacist: No    Confirmed patient received a Conservation officer, historic buildings and a Surveyor, mining with first shipment. The patient will receive a drug information handout for each medication shipped and additional FDA Medication Guides as required.       DISEASE/MEDICATION-SPECIFIC INFORMATION        For patients on injectable medications: Patient currently has 0 doses left.  Next injection is scheduled for 06/08/21.    SPECIALTY MEDICATION ADHERENCE     Medication Adherence    Patient reported X missed doses in the last month: 0  Specialty Medication: STELARA 90 mg/mL  Patient is on additional specialty medications: No              Were doses missed due to medication being on hold? No    Stelara 90 mg/ml: 0 days of medicine on hand        REFERRAL TO PHARMACIST     Referral to the pharmacist: Not needed      [facility]     Shipping address confirmed in Epic.     Delivery Scheduled: Yes, Expected medication delivery date: 06/07/21.     Medication will be delivered via Same Day Courier to the prescription address in Epic WAM.    Rodney Bender   [facility] Specialty Technician

## 2021-06-07 DIAGNOSIS — K5000 Crohn's disease of small intestine without complications: Principal | ICD-10-CM

## 2021-06-07 MED FILL — STELARA 90 MG/ML SUBCUTANEOUS SYRINGE: SUBCUTANEOUS | 56 days supply | Qty: 1 | Fill #1

## 2021-06-08 MED ORDER — DRONABINOL 10 MG CAPSULE
ORAL_CAPSULE | Freq: Two times a day (BID) | ORAL | 0 refills | 30 days | Status: CP
Start: 2021-06-08 — End: ?

## 2021-06-11 DIAGNOSIS — K50818 Crohn's disease of both small and large intestine with other complication: Principal | ICD-10-CM

## 2021-06-11 MED ORDER — DRONABINOL 10 MG CAPSULE
ORAL_CAPSULE | Freq: Two times a day (BID) | ORAL | 0 refills | 0.00000 days | Status: CP
Start: 2021-06-11 — End: ?

## 2021-06-11 NOTE — Unmapped (Signed)
Duplicate, approved one copy of order.

## 2021-06-13 NOTE — Unmapped (Signed)
----- Message from Harlen Labs, RN sent at 06/13/2021  8:47 AM EDT -----  Regarding: RE: expires today 1015  Good morning,    Thank you for looping me in. I honestly have done quite a few marinol appeals (Oncology, GI, Transplant patients) and unfortunately they are almost always denied. For GI, I have done them for malnutrition, nausea, vomiting and still it gets denied. I don't think this is something that would be beneficial to try appealing but I will leave the decision up to Dr. Elizebeth Brooking.     Thanks,  Jess  ----- Message -----  From: Vanessa Ralphs  Sent: 06/13/2021   8:18 AM EDT  To: Hershal Coria, RN, Hunt Oris, MD, #  Subject: RE: expires today [phone]                           Good morning Kim!    I have tagged Shanda Bumps in this message as well for her thoughts on appealing the Marinol. I do not see it that often so I'm not entirely certain how successful the appeal would be.    Shanda Bumps -- Have you had other Marinols you have appealed successfully? Or what are your thoughts on the below?    --Aundra Millet    ----- Message -----  From: Hershal Coria, RN  Sent: 06/12/2021   2:19 PM EDT  To: Hunt Oris, MD, Vanessa Ralphs  Subject: RE: expires today 63                           Hi Dr. Elizebeth Brooking,  This patient's Marinol was denied. Reason for denial: Patient must have a FDA approved diagnosis..  (Cost with Good Rx is $150-200/month at [employer] for his dose).    Megan--Looks like it was denied when associated with Chron's. Have you any success in appealing these?  It looks like Marinol is only approved for anorexia secondary to AIDS or nausea/vomiting secondary to chemo in patients who have failed of antiemetics.  Thanks for any tips you might have!    Thanks, Kim    ----- Message -----  From: Vanessa Ralphs  Sent: 06/12/2021  12:34 PM EDT  To: Hershal Coria, RN  Subject: RE: expires today 47                           Hey Kim! He was unfortunately denied for his marinol. We can appeal if needed:    Medication (Brand/Generic): DRONABINOL 10MG  CAPSULES  Contact Person: [employer]  Denial ID/Case#: [id number]   Reason for denial: Patient must have a FDA approved diagnosis.    --Aundra Millet    ----- Message -----  From: Hershal Coria, RN  Sent: 06/11/2021   1:03 PM EDT  To: Vanessa Ralphs  Subject: RE: expires today 42                           Thanks for doing that, Aundra Millet!!  -Kim  ----- Message -----  From: Vanessa Ralphs  Sent: 06/11/2021  12:53 PM EDT  To: Hershal Coria, RN  Subject: RE: expires today 66                           Hey Kim    The questions did not actually  come to our queue -- the script was routed from [facility] so they are/were sitting in that queue. I manually just submitted his PA now through [facility] and I will let you know what the decision is. Thank you!    --Aundra Millet    ----- Message -----  From: Hershal Coria, RN  Sent: 06/11/2021   9:01 AM EDT  To: [facility] E-Prescribing Messages  Subject: expires today 1015                               Hi there,  Looks like the question set for this pt's Marinol expires this AM after initiation Friday. Can you please investigate if a new PA request will be needed?  Thanks!  -Selena Batten

## 2021-06-15 MED ORDER — OLANZAPINE 2.5 MG TABLET
ORAL_TABLET | Freq: Every evening | ORAL | 0 refills | 30 days
Start: 2021-06-15 — End: 2021-07-15

## 2021-06-15 NOTE — Unmapped (Signed)
I spoke with the patient, he will discuss with insurance, wants to attempt an appeal even if the odds are against [place]. I'll work on a letter for him.

## 2021-06-15 NOTE — Unmapped (Signed)
Pt called about his denied Marinol. Explained the PA process and the denial letter we got (not FDA approved for his indication).  Advised pt that Dr. Elizebeth Brooking wrote in a message to him via MyChart that he was writing an appeal letter to insurance and we will need to wait for insurance response.  Answered pt and wife questions about the PA process.    Pt asked if there is an alternate medication to help with his appetite while awaiting Marinol appeal.  Routing to Dr. Elizebeth Brooking.

## 2021-06-18 MED ORDER — OLANZAPINE 2.5 MG TABLET
ORAL_TABLET | Freq: Every evening | ORAL | 0 refills | 30 days | Status: CP
Start: 2021-06-18 — End: 2021-07-18

## 2021-06-18 NOTE — Unmapped (Signed)
I updated patient we will try to appeal through Medicaid instead of Medicare, but he won't quality for patient assistance program unfortunately.

## 2021-07-02 ENCOUNTER — Ambulatory Visit: Payer: Medicare Other | Attending: Anesthesiology | Admitting: Anesthesiology

## 2021-07-02 ENCOUNTER — Encounter: Payer: Self-pay | Admitting: Anesthesiology

## 2021-07-02 VITALS — BP 125/77 | HR 95 | Temp 99.0°F | Ht 71.0 in | Wt 140.0 lb

## 2021-07-02 DIAGNOSIS — K5090 Crohn's disease, unspecified, without complications: Secondary | ICD-10-CM | POA: Diagnosis present

## 2021-07-02 DIAGNOSIS — G894 Chronic pain syndrome: Secondary | ICD-10-CM | POA: Insufficient documentation

## 2021-07-02 DIAGNOSIS — M542 Cervicalgia: Secondary | ICD-10-CM

## 2021-07-02 DIAGNOSIS — R109 Unspecified abdominal pain: Secondary | ICD-10-CM

## 2021-07-02 DIAGNOSIS — M5412 Radiculopathy, cervical region: Secondary | ICD-10-CM | POA: Diagnosis present

## 2021-07-02 DIAGNOSIS — F1190 Opioid use, unspecified, uncomplicated: Secondary | ICD-10-CM | POA: Diagnosis present

## 2021-07-02 DIAGNOSIS — G8929 Other chronic pain: Secondary | ICD-10-CM

## 2021-07-02 DIAGNOSIS — Z79891 Long term (current) use of opiate analgesic: Secondary | ICD-10-CM | POA: Diagnosis present

## 2021-07-02 DIAGNOSIS — M549 Dorsalgia, unspecified: Secondary | ICD-10-CM | POA: Insufficient documentation

## 2021-07-02 MED ORDER — OXYCODONE-ACETAMINOPHEN 10-325 MG PO TABS: 1.0000 | ORAL_TABLET | Freq: Three times a day (TID) | ORAL | 0 refills | Status: DC | PRN

## 2021-07-02 MED ORDER — OXYCODONE-ACETAMINOPHEN 10-325 MG PO TABS: 1.0000 | ORAL_TABLET | Freq: Three times a day (TID) | ORAL | 0 refills | Status: AC | PRN

## 2021-07-02 NOTE — Progress Notes (Signed)
Nursing Pain Medication Assessment:  Safety precautions to be maintained throughout the outpatient stay will include: orient to surroundings, keep bed in low position, maintain call bell within reach at all times, provide assistance with transfer out of bed and ambulation.  Medication Inspection Compliance: Pill count conducted under aseptic conditions, in front of the patient. Neither the pills nor the bottle was removed from the patient's sight at any time. Once count was completed pills were immediately returned to the patient in their original bottle.  Medication: Oxycodone/APAP Pill/Patch Count:  2 of 75 pills remain Pill/Patch Appearance: Markings consistent with prescribed medication Bottle Appearance: Standard pharmacy container. Clearly labeled. Filled Date: 5 / 10 / 2023 Last Medication intake:  Today
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
## 2021-07-13 NOTE — Unmapped (Signed)
I spoke with the patient. I explained we do not have any reasonable hope to get this overturned with insurance,and there are not good alternatives for his case. His insurance will change in a few months, may be reasonable to try again with his new insurance. They covered it in the past, may cover again, but no guarantee.

## 2021-07-13 NOTE — Unmapped (Signed)
Received call from pt's pharmacist to follow-up on Marinol. She states that pt is asking about this and has called insurance and was told we need to submit PA. Advised pharmacist that, unfortunately, PA was denied as a non-covered diagnosis code (see media tab 06/12/21) and I informed the patient and his wife of this.  Pharmacist runs claim with Medicaid plan and sees it is not a covered benefit without PA from Medicaid. Pharmacist advises that PA typically denied by Medicaid when denied by primary insurance.      Advised pharmacist I will circle back to provider for guidance on alternate treatment or appeal.    Denial letter states: DRONABINOL CAP 10MG  is not FDA approved for your medical condition(s): Appetite support, lack of appetite due to Crohn's disease. These condition(s) are not supported by one of the accepted references. Therefore, your drug is denied because it is not being used for a medically accepted indication.. If the treating physician would like to discuss this coverage decision with the physician health care professional reviewer, please call [employer] Prior Authorization department at 1-  [phone].

## 2021-07-16 ENCOUNTER — Ambulatory Visit: Admit: 2021-07-16 | Discharge: 2021-07-17 | Payer: MEDICARE

## 2021-07-16 DIAGNOSIS — K50811 Crohn's disease of both small and large intestine with rectal bleeding: Principal | ICD-10-CM

## 2021-07-16 LAB — CBC W/ AUTO DIFF
BASOPHILS ABSOLUTE COUNT: 0 10*9/L (ref 0.0–0.1)
BASOPHILS RELATIVE PERCENT: 0.6 %
EOSINOPHILS ABSOLUTE COUNT: 0.1 10*9/L (ref 0.0–0.5)
EOSINOPHILS RELATIVE PERCENT: 0.8 %
HEMATOCRIT: 37.7 % — ABNORMAL LOW (ref 39.0–48.0)
HEMOGLOBIN: 13 g/dL (ref 12.9–16.5)
LYMPHOCYTES ABSOLUTE COUNT: 2 10*9/L (ref 1.1–3.6)
LYMPHOCYTES RELATIVE PERCENT: 27.4 %
MEAN CORPUSCULAR HEMOGLOBIN CONC: 34.5 g/dL (ref 32.0–36.0)
MEAN CORPUSCULAR HEMOGLOBIN: 32.6 pg — ABNORMAL HIGH (ref 25.9–32.4)
MEAN CORPUSCULAR VOLUME: 94.7 fL (ref 77.6–95.7)
MEAN PLATELET VOLUME: 7.5 fL (ref 6.8–10.7)
MONOCYTES ABSOLUTE COUNT: 0.4 10*9/L (ref 0.3–0.8)
MONOCYTES RELATIVE PERCENT: 6.2 %
NEUTROPHILS ABSOLUTE COUNT: 4.7 10*9/L (ref 1.8–7.8)
NEUTROPHILS RELATIVE PERCENT: 65 %
NUCLEATED RED BLOOD CELLS: 0 /100{WBCs} (ref <=4)
PLATELET COUNT: 264 10*9/L (ref 150–450)
RED BLOOD CELL COUNT: 3.98 10*12/L — ABNORMAL LOW (ref 4.26–5.60)
RED CELL DISTRIBUTION WIDTH: 13.4 % (ref 12.2–15.2)
WBC ADJUSTED: 7.2 10*9/L (ref 3.6–11.2)

## 2021-07-16 LAB — COMPREHENSIVE METABOLIC PANEL
ALBUMIN: 4.3 g/dL (ref 3.4–5.0)
ALKALINE PHOSPHATASE: 92 U/L (ref 46–116)
ALT (SGPT): 17 U/L (ref 10–49)
ANION GAP: 9 mmol/L (ref 5–14)
AST (SGOT): 23 U/L (ref <=34)
BILIRUBIN TOTAL: 0.4 mg/dL (ref 0.3–1.2)
BLOOD UREA NITROGEN: 13 mg/dL (ref 9–23)
BUN / CREAT RATIO: 16
CALCIUM: 9.7 mg/dL (ref 8.7–10.4)
CHLORIDE: 102 mmol/L (ref 98–107)
CO2: 24 mmol/L (ref 20.0–31.0)
CREATININE: 0.81 mg/dL
EGFR CKD-EPI (2021) MALE: >90 mL/min/{1.73_m2} (ref >=60)
GLUCOSE RANDOM: 124 mg/dL (ref 70–179)
POTASSIUM: 3.9 mmol/L (ref 3.4–4.8)
PROTEIN TOTAL: 7.7 g/dL (ref 5.7–8.2)
SODIUM: 135 mmol/L (ref 135–145)

## 2021-07-16 LAB — C-REACTIVE PROTEIN: C-REACTIVE PROTEIN: <4 mg/L (ref <=10.0)

## 2021-07-16 NOTE — Unmapped (Signed)
[facility]  Faculty Established Follow-up Visit Note    REFERRING PROVIDER: Suanne Marker, MD  [address]  [place],  [place] [address]    PRIMARY CARE PROVIDER: Suanne Marker, MD    Assessment and Plan:  Rodney Bender is a 51 y.o. 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    Crohn's disease of small intestine with other complication  Historically he has had very high risk disease with an ileocecectomy and multiple revisions of the anastomosis losing a reasonable amount of small bowel though substantially less than 50%. Non-adherence before he got married was almost certainly a factor in this for him. He struggles to maintain his weight and we have tried multiple appetite stimulants for some time, holding our own but not gaining significant weight either.  I think if we do not see signs of active disease on his labs, I am pretty confident he is adherent to his Stelara, and has had relatively recent imaging and colonoscopy, we could hold off on repeat colonoscopy at this time, and follow-up in 6 months.  I would like to get anti-TNF antibodies at that time in case he needs to switch because he has had long-term high risk disease and he is done pretty well on Stelara for quite some time, but colonoscopy looks imperfect on its not unlikely he could develop some more significant relapse at some point postsurgically now almost five years out. He's had some melena, I think a colonoscopy is reasonable again to assess. He would much rather stay on Stelara but understands the goal to avoid surgery.  - Comprehensive metabolic panel  - CBC w/ Differential  - Check anti-TNF antibodies and baseline labs in case of therapy change  - Colonoscopy    Functional dyspepsia: He has chronic functional dyspepsia symptoms that do not seem to correlate with his IBD disease activity. Given he is having some chronic nausea today, Zyprexa might be helpful for appetite and weight gain as well as his nausea is reasonably safe.  We have tried centrally acting agents in the past but he gets better relief from gabapentin.  He is additionally having some reflux symptoms and is possible that GERD can contribute to his dyspepsia as well, so we will start a low-dose PPI today see if that gets many benefit with the symptoms.  This has been of questionable benefit in the past for dyspepsia alone, but he is having heartburn and regurgitation now.  - Continue pantoprazole 40 mg once daily 30 minutes before meal  - Continue gabapentin to 600 mg 3 times daily    Patient Instructions   1. Call the [phone] number to arrange and upper endoscopy and colonoscopy. I'd call today so the schedule doesn't fill.  2. We'll check labs today to consider changes in Crohn's disease medicines.  3. When your insurance changes we'll try again to get the Marinol approved.   -------------------------------------------------------------------------------    If you have any questions or concerns please contact me on MyChart or call my nurse, Selena Batten, at [phone].    For appointments, please call [phone]. If you need an urgent appointment and there is no existing availability please ask me on MyChart or call Kim.    For scheduling GI procedures (Upper endoscopy, colonoscopy, manometry, pH/impedence test), please call the schedulers at [phone].  For scheduling radiology exams call [phone].  You can reach the [place] lab at [phone].    For emergencies after hours you can call [phone] and ask for the GI medicine fellow on call.    ==============================================      Return in about 4 months (around 11/15/2021).    I personally spent 12 minutes face-to-face and non-face-to-face in the care of this patient, which includes all pre, intra, and post visit time on the date of service. All documented time was specific to the E/M visit and does not include any procedures that may have been performed.     Haylie Mccutcheon C. Elizebeth Brooking, MD MPH  Clinical Assistant Professor  Gastroenterology and Hepatology     -------------------------    Chief Complaint    Abdominal Pain     History of Present Illness:      Rodney Bender is a 51 y.o. 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    He's overall not so great, having baseline 4-5 bowel movements daily, but many are dark now. He's in worse shape with eating since insurance stopped paying for his Marinol. He's not eager to change Crohn's medicines but he's appropriately concerned about the risk to more surgeries. He's open to an     Past Medical History:   Diagnosis Date   ??? Anxiety    ??? Crohn's disease (CMS-HCC)     diagnosed in 1990   ??? GERD (gastroesophageal reflux disease)    ??? Hypertension 10/08/2016     Past Surgical History:  ??? Ileocolectomy with anastomosis and multiple IR drains about 5 cm ileum and colon (09/16/2016)  ??? Ileocolectomy about 15 cm (2009)  ??? Ileocecectomy unclear extent (1998)     Current Outpatient Medications on File Prior to Visit   Medication Sig Dispense Refill   ??? droNABinol (MARINOL) 10 MG capsule TAKE 1 CAPSULE BY MOUTH TWICE DAILY 30 MINUTES BEFORE A MEAL 60 capsule 0   ??? gabapentin (NEURONTIN) 300 MG capsule Take 2 capsules (600 mg total) by mouth Three (3) times a day. 180 capsule 11   ??? loperamide (IMODIUM A-D) 2 mg tablet Take 1 tablet (2 mg total) by mouth daily as needed.     ??? 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 OLANZapine (ZYPREXA) 2.5 MG tablet Take 1 tablet (2.5 mg total) by mouth nightly. 30 tablet 0   ??? oxyCODONE-acetaminophen (PERCOCET) 10-325 mg per tablet Take 1 tablet by mouth every eight (8) hours as needed.     ??? pantoprazole (PROTONIX) 40 MG tablet Take 1 tablet (40 mg total) by mouth daily as needed. 90 tablet 3   ??? promethazine (PHENERGAN) 25 MG tablet Take 2 tablets (50 mg total) by mouth every six (6) hours as needed for nausea. 45 tablet 11   ??? sildenafiL (VIAGRA) 50 MG tablet Take 1 tablet (50 mg total) by mouth daily as needed for erectile dysfunction. 30 tablet 5   ??? therapeutic multivitamin (THERAGRAN) tablet Take 1 tablet by mouth daily.     ??? ustekinumab (STELARA) 90 mg/mL Syrg syringe Inject the contents of 1 syringe (90 mg total) under the skin every 8 weeks. 1 mL 5   ??? citalopram (CELEXA) 10 MG tablet Take 10 mg by mouth daily as needed.       No current facility-administered medications on file prior to visit.     Allergies  Reviewed on  07/16/2021      Reactions Comments    Tramadol Itching, Anxiety Other reaction(s): Other (See Comments)    Penicillins  Other reaction(s): Itching-Allergy    Dilaudid [hydromorphone] Anxiety     Morphine Itching           Family History   Problem Relation Age of Onset   ??? Hyperlipidemia Father    ??? Cancer Father         kidney   ??? Cancer Maternal Aunt    ??? Stroke Mother    ??? No Known Problems Sister    ??? No Known Problems Brother    ??? No Known Problems Maternal Uncle    ??? No Known Problems Paternal Aunt    ??? No Known Problems Paternal Uncle    ??? No Known Problems Maternal Grandmother    ??? No Known Problems Maternal Grandfather    ??? No Known Problems Paternal Grandmother    ??? No Known Problems Paternal Grandfather    ??? Anesthesia problems Neg Hx    ??? Broken bones Neg Hx    ??? Clotting disorder Neg Hx    ??? Collagen disease Neg Hx    ??? Diabetes Neg Hx    ??? Dislocations Neg Hx    ??? Fibromyalgia Neg Hx    ??? Gout Neg Hx    ??? Hemophilia Neg Hx    ??? Osteoporosis Neg Hx    ??? Rheumatologic disease Neg Hx    ??? Scoliosis Neg Hx    ??? Severe sprains Neg Hx    ??? Sickle cell anemia Neg Hx    ??? Spinal Compression Fracture Neg Hx      Social History     Tobacco Use   ??? Smoking status: Former     Packs/day: 1.00     Years: 18.00     Pack years: 18.00 Types: Cigarettes     Start date: 08/27/2003     Quit date: 06/06/2017     Years since quitting: 4.1   ??? Smokeless tobacco: Never   ??? Tobacco comments:     Pt smokes 1ppd, Pt is interested in tobacco cessation    Vaping Use   ??? Vaping Use: Never used   Substance Use Topics   ??? Alcohol use: No   ??? Drug use: Not Currently     Types: Marijuana     Review of Systems:  The balance of 12 systems reviewed is negative except as noted in the history of present illness.    Vital Signs: BP 130/77  - Pulse 77  - Temp 36.8 ??C (98.2 ??F)  - Resp 22  - Ht 180 cm (5' 10.87)  - Wt 61.2 kg (135 lb)  - BMI 18.90 kg/m??     Wt Readings from Last 6 Encounters:   07/16/21 61.2 kg (135 lb)   03/16/21 62.4 kg (137 lb 9.6 oz)   05/15/20 59 kg (130 lb)   04/20/20 62.1 kg (137 lb)   04/06/20 63.5 kg (140 lb)   03/10/20 63.4 kg (139 lb 12.8 oz)     Review of Systems:  General appearance: Appears well, no distress and cachectic.  Eyes: Anicteric sclera. No erythema.  ENT: No oral ulcers. Posterior oropharynx unremarkable.  Cardiovascular: RRR without murmurs, heaves, or thrills. No lower extremity edema.  Pulmonary: Normal work of breathing. Acyanotic.  Abdominal: soft, tender epigastrium, nondistended, no masses or organomegaly.  Musculoskeletal: No temporal wasting. Normal joints of the hand.  Skin: No  jaundice. No rashes.  Neurologic: Alert, oriented, and appropriate.  Psychiatric: Appropriate.    Data Review:  Review of labs found:  No new labs.    Review of endoscopy found:  ?? Colonoscopy - 05/15/2020 - i1 at anastomosis, SESCD 3, one 2 mm polyp  ?? Colonoscopy - 02/12/2019 - i1 at anastomosis, no more proximal ileal inflammation  ?? Colonoscopy - 04/06/2018 - i0 at anastomosis  ?? Colonoscopy - 03/21/2017 - i1 at anastomosis, one 2mm polup  ?? Colonoscopy - 08/09/2016 - occlusive ulcerated stricture at anastomosis   ?? Upper endoscopy - 07/15/2014 - nl  ?? Colonoscopy - 07/15/2014 - inflammed anastomosis dilated to 12-32mm, scattered apthae distal ileum  ?? Colonoscopy - 07/21/2013 - i0 anastomosis, nl TI, hemorrhoids  ?? Upper endoscopy - 07/21/2013 - nl  ?? Upper endoscopy - 11/10/2012 - nl  ?? Colonoscopy - 11/10/2012 - traversable anastomosis with i4 inflammation, and ulcerations in the TI  ?? Note procedures on record back to 06/12/1998    Review of imaging found:  ?? CT enterography - 04/07/2020 - scattered small bowel narrowing without adjacent inflammatory stranding  ?? CT abdomen/pelvis w contrast - 02/08/2019 - Diffuse small bowel wall thickening is seen with surrounding free fluid suggestive of active inflammation  ?? CT abdomen/pelvis w contrast - 12/20/2017 - Mucosal thickening and enhancement in the left hemiabdomen small bowel with associated engorged mesenteric vessels. Mucosal wall thickening in the sigmoid colon and rectum  ?? CT abdomen/pelvis w IV and oral contrast - 03/12/2017 - Status post ileocecectomy, unremarkable  ?? Note exams on record back to 06/16/1998  .  Review of other studies found:  None

## 2021-07-16 NOTE — Unmapped (Signed)
Call the [phone] number to arrange and upper endoscopy and colonoscopy. I'd call today so the schedule doesn't fill.  We'll check labs today to consider changes in Crohn's disease medicines.  When your insurance changes we'll try again to get the Marinol approved.   -------------------------------------------------------------------------------    If you have any questions or concerns please contact me on MyChart or call my nurse, Selena Batten, at [phone].    For appointments, please call [phone]. If you need an urgent appointment and there is no existing availability please ask me on MyChart or call Kim.    For scheduling GI procedures (Upper endoscopy, colonoscopy, manometry, pH/impedence test), please call the schedulers at [phone].  For scheduling radiology exams call [phone].  You can reach the [facility] lab at [phone].    For emergencies after hours you can call [phone] and ask for the GI medicine fellow on call.    ==============================================

## 2021-07-17 NOTE — Unmapped (Signed)
EGD  Procedure #1      Colonoscopy  Procedure #2      [id number]  MRN      COTTON  Endoscopist      FALSE  Is the patient's health insurance [employer], [place] [facility]), or [employer]?      FALSE  Urgent procedure      FALSE  Do you take: Plavix (clopidogrel), Coumadin (warfarin), Lovenox (enoxaparin), Pradaxa (dabigatran), Effient (prasugrel), Xarelto (rivaroxaban), Eliquis (apixaban), Pletal (cilostazol), or Brilinta (ticagrelor)?    FALSE  Do you have hemophilia, von Willebrand disease, thrombocytopenia?      FALSE  Do you have a pacemaker or implanted cardiac defibrillator?      FALSE  Are you pregnant?      FALSE  Has a [facility] provider specified the location(s)?        Which location(s) did the [facility] provider specify?      FALSE     [facility]      FALSE     [place]      FALSE     [facility]      FALSE     [facility]      FALSE  Is procedure indication for variceal banding (this does NOT include variceal screening)?              5  Height (feet)      11  Height (inches)      142  Weight (pounds)      19.8  BMI              FALSE  Did the ordering provider specify a bowel prep?      NO       What bowel prep was specified?      FALSE  Do you have chronic kidney disease?      FALSE  Do you have chronic constipation or have you had poor quality bowel preps for past colonoscopies?      TRUE  Do you have Crohn's disease or ulcerative colitis?      FALSE  Have you had weight loss surgery?              FALSE  Are you in the process of scheduling or awaiting results of a heart ultrasound, stress test, or catheterization to evaluate new or worsening chest pain, dizziness, or shortness of breath?     FALSE  When you walk around your house or grocery store, do you have to stop and rest due to shortness of breath, chest pain, or light-headedness?      FALSE  Have you had a heart attack, stroke or heart stent placement within the past 6 months?      FALSE  Do you ever use supplemental oxygen?      FALSE  Have you been hospitalized for cirrhosis of the liver or heart failure in the last 12 months?      FALSE  Have you been treated for mouth or throat cancer with radiation or surgery?      FALSE  Have you been told that it is difficult for doctors to insert a breathing tube in you during anesthesia?      FALSE  Have you had a heart or lung transplant?              FALSE  Are you on dialysis?      FALSE  Do you have cirrhosis of the liver?  FALSE  Do you have myasthenia gravis?      FALSE  Is the patient a prisoner?              FALSE  Have you been diagnosed with sleep apnea or do you wear a CPAP machine at night?      FALSE  Are you younger than 30?      FALSE  Have you previously received propofol sedation administered by an anesthesiologist for a GI procedure?      FALSE  Do you drink an average of more than 3 drinks of alcohol per day?      TRUE  Do you regularly take suboxone or any prescription medications for chronic pain?      FALSE  Do you regularly take Ativan, Klonopin, Xanax, Valium, lorazepam, clonazepam, alprazolam, or diazepam?      FALSE  Have you previously had difficulty with sedation during a GI procedure?      FALSE  Have you been diagnosed with PTSD?      FALSE  Are you allergic to fentanyl or midazolam (Versed)?      FALSE  Do you take medications for HIV?      ################### ################################################################################################################### ################ ############### ###############   MRN:          [id number]      Anticoag Review:  No      Nurse Triage:  No      GI Clinic Consult:  No      Procedure(s):  EGD Colonoscopy     [facility]  [place]      Endoscopist:  COTTON      Urgent:            No       Prep:               Miralax Prep              ################### ################################################################################################################### ################ ############### ###############

## 2021-07-19 LAB — TB AG1: TB AG1 VALUE: 0.01

## 2021-07-19 LAB — TB NIL: TB NIL VALUE: 0

## 2021-07-19 LAB — QUANTIFERON TB GOLD PLUS
QUANTIFERON ANTIGEN 1 MINUS NIL: 0.01 [IU]/mL
QUANTIFERON ANTIGEN 2 MINUS NIL: 0.01 [IU]/mL
QUANTIFERON MITOGEN: 10 [IU]/mL
QUANTIFERON TB GOLD PLUS: NEGATIVE
QUANTIFERON TB NIL VALUE: 0 [IU]/mL

## 2021-07-19 LAB — TB MITOGEN: TB MITOGEN VALUE: 10

## 2021-07-19 LAB — TB AG2: TB AG2 VALUE: 0.01

## 2021-07-24 NOTE — Unmapped (Signed)
Patient scheduled for Chronic Condition Optimization (CCOO) with Marda Stalker, MD on 07/30/21    Abstraction Result Flowsheet Data    This patient's last AWV date: [facility] Last Medicare Wellness Visit Date: Not Found  This patients last WCC/CPE date: : Not Found      Reason for Encounter  Reason for Encounter: Outreach  Primary Reason for Outreach: CCOO+  Text Message: Yes  MyChart Message: No  Outreach Call Outcome: Scheduled

## 2021-07-25 NOTE — Unmapped (Signed)
[facility] Clinical Assessment & Refill Coordination Note    Rodney Bender, [place]: May 07, 1970  Phone: [phone] (home)     All above HIPAA information was verified with patient.     Was a Nurse, learning disability used for this call? No    Specialty Medication(s):   Inflammatory Disorders: Stelara     Current Outpatient Medications   Medication Sig Dispense Refill    citalopram (CELEXA) 10 MG tablet Take 10 mg by mouth daily as needed.      droNABinol (MARINOL) 10 MG capsule TAKE 1 CAPSULE BY MOUTH TWICE DAILY 30 MINUTES BEFORE A MEAL 60 capsule 0    gabapentin (NEURONTIN) 300 MG capsule Take 2 capsules (600 mg total) by mouth Three (3) times a day. 180 capsule 11    loperamide (IMODIUM A-D) 2 mg tablet Take 1 tablet (2 mg total) by mouth daily as needed.      naloxone (NARCAN) 4 mg nasal spray For respiratory depression from opioids      OLANZapine (ZYPREXA) 2.5 MG tablet Take 1 tablet (2.5 mg total) by mouth nightly. 30 tablet 0    oxyCODONE-acetaminophen (PERCOCET) 10-325 mg per tablet Take 1 tablet by mouth every eight (8) hours as needed.      pantoprazole (PROTONIX) 40 MG tablet Take 1 tablet (40 mg total) by mouth daily as needed. 90 tablet 3    promethazine (PHENERGAN) 25 MG tablet Take 2 tablets (50 mg total) by mouth every six (6) hours as needed for nausea. 45 tablet 11    sildenafiL (VIAGRA) 50 MG tablet Take 1 tablet (50 mg total) by mouth daily as needed for erectile dysfunction. 30 tablet 5    therapeutic multivitamin (THERAGRAN) tablet Take 1 tablet by mouth daily.      ustekinumab (STELARA) 90 mg/mL Syrg syringe Inject the contents of 1 syringe (90 mg total) under the skin every 8 weeks. 1 mL 5     No current facility-administered medications for this visit.        Changes to medications: Eva reports no changes at this time.    Allergies   Allergen Reactions    Tramadol Itching and Anxiety     Other reaction(s): Other (See Comments)    Penicillins      Other reaction(s): Itching-Allergy    Dilaudid [Hydromorphone] Anxiety    Morphine Itching       Changes to allergies: No    SPECIALTY MEDICATION ADHERENCE     Stelara 90 mg/ml: 0 days of medicine on hand       Medication Adherence    Patient reported X missed doses in the last month: 0  Specialty Medication: Stelara 90mg /mL - 1q8w  Informant: patient          Specialty medication(s) dose(s) confirmed: Regimen is correct and unchanged.     Are there any concerns with adherence? No    Adherence counseling provided? Not needed    CLINICAL MANAGEMENT AND INTERVENTION      Clinical Benefit Assessment:    Do you feel the medicine is effective or helping your condition? Yes    Clinical Benefit counseling provided? Progress note from 06/19 shows evidence of clinical benefit    Adverse Effects Assessment:    Are you experiencing any side effects? No    Are you experiencing difficulty administering your medicine? No    Quality of Life Assessment:    Quality of Life    Rheumatology  Oncology  Dermatology  Cystic  Fibrosis          How many days over the past month did your Crohn's  keep you from your normal activities? For example, brushing your teeth or getting up in the morning. Patient declined to answer    Have you discussed this with your provider? Not needed    Acute Infection Status:    Acute infections noted within Epic:  No active infections  Patient reported infection: None    Therapy Appropriateness:    Is therapy appropriate and patient progressing towards therapeutic goals? Yes, therapy is appropriate and should be continued    DISEASE/MEDICATION-SPECIFIC INFORMATION      For patients on injectable medications: Patient currently has 0 doses left.  Next injection is scheduled for 08/04/2021.    PATIENT SPECIFIC NEEDS     Does the patient have any physical, cognitive, or cultural barriers? No    Is the patient high risk? No    Does the patient require a Care Management Plan? No     SOCIAL DETERMINANTS OF HEALTH     At the [facility], we have learned that life circumstances - like trouble affording food, housing, utilities, or transportation can affect the health of many of our patients.   That is why we wanted to ask: are you currently experiencing any life circumstances that are negatively impacting your health and/or quality of life? No    Social Determinants of Psychologist, prison and probation services Strain: Not on file   Internet Connectivity: Not on file   Food Insecurity: Food Insecurity Present    Worried About Programme researcher, broadcasting/film/video in the Last Year: Often true    Barista in the Last Year: Often true   Tobacco Use: Medium Risk    Smoking Tobacco Use: Former    Smokeless Tobacco Use: Never    Passive Exposure: Not on file   Housing/Utilities: High Risk    Within the past 12 months, have you ever stayed: outside, in a car, in a tent, in an overnight shelter, or temporarily in someone else's home (i.e. couch-surfing)?: Yes    Are you worried about losing your housing?: No    Within the past 12 months, have you been unable to get utilities (heat, electricity) when it was really needed?: Yes   Alcohol Use: Not on file   Transportation Needs: Not on file   Substance Use: Not on file   Health Literacy: Low Risk     : Never   Physical Activity: Not on file   Interpersonal Safety: Not on file   Stress: Not on file   Intimate Partner Violence: Not on file   Depression: Not on file   Social Connections: Not on file       Would you be willing to receive help with any of the needs that you have identified today? Not applicable       SHIPPING     Specialty Medication(s) to be Shipped:   Inflammatory Disorders: Stelara    Other medication(s) to be shipped: No additional medications requested for fill at this time     Changes to insurance: No    Delivery Scheduled: Yes, Expected medication delivery date: 07/27/2021.     Medication will be delivered via Same Day Courier to the confirmed prescription address in [facility].    The patient will receive a drug information handout for each medication shipped and additional FDA Medication Guides as required.  Verified that patient  has previously received a Conservation officer, historic buildings and a Surveyor, mining.    The patient or caregiver noted above participated in the development of this care plan and knows that they can request review of or adjustments to the care plan at any time.      All of the patient's questions and concerns have been addressed.    Teofilo Pod   [facility] Specialty Pharmacist

## 2021-07-27 MED FILL — STELARA 90 MG/ML SUBCUTANEOUS SYRINGE: SUBCUTANEOUS | 56 days supply | Qty: 1 | Fill #2

## 2021-07-30 ENCOUNTER — Ambulatory Visit: Admit: 2021-07-30 | Discharge: 2021-07-31 | Payer: MEDICARE

## 2021-07-30 DIAGNOSIS — F419 Anxiety disorder, unspecified: Principal | ICD-10-CM

## 2021-07-30 DIAGNOSIS — K50818 Crohn's disease of both small and large intestine with other complication: Principal | ICD-10-CM

## 2021-07-30 NOTE — Unmapped (Addendum)
It was great to meet you today. Please let me know if any questions or concerns arise.

## 2021-07-30 NOTE — Unmapped (Signed)
[facility] at [facility]     Type of visit: face to face    Are you located in [place]? (for virtual visits only) N/A    Reason for visit: Follow up  Patient stated abdomen    Questions / Concerns that need to be addressed:     Screening BP 105/58 P 62    Omron BPs (complete if screening BP has a systolic  > 130 or diastolic > 80)  BP#1    BP#2   BP#3     Average BP   (please note this as a comment in vitals)     PTHomeBP         HCDM reviewed and updated in Epic:    We are working to make sure all of our patients??? wishes are updated in Epic and part of that is documenting a Environmental health practitioner for each patient  A Health Care Decision Maker is someone you choose who can make health care decisions for you if you are not able - who would you most want to do this for you????  was updated.    HCDM (patient stated preference): Delmont,Bernadette - Spouse - [phone]    BPAs completed:  PHQ2    COVID-19 Vaccine Summary  Which COVID-19 Vaccine was administered  Type:  Janssen-Complete  Dates Given:                   Immunization History   Administered Date(s) Administered   ??? INFLUENZA TIV (TRI) PF (IM) 03/26/2009   ??? PNEUMOCOCCAL POLYSACCHARIDE 23 07/19/2011   ??? PPD Test 07/18/2007, 10/25/2010   ??? Pneumococcal Conjugate 13-Valent 07/12/2014   ??? TD(TDVAX),ADSORBED,2LF(IM)(PF) 07/18/2014       __________________________________________________________________________________________    SCREENINGS COMPLETED IN FLOWSHEETS    HARK Screening       AUDIT       PHQ2       PHQ9          P4 Suicidality Screener                GAD7       COPD Assessment       Falls Risk

## 2021-07-30 NOTE — Unmapped (Signed)
Internal Medicine Clinic Visit    Reason for visit: Follow up    A/P: : Patient is a 51 year old male with a history of Crohn's disease s/p ileocecal resection x4 and anxiety who presents for follow-up.          No diagnosis found.    1. Crohn's Disease  Patient follows with Dr. Elizebeth Brooking for his Crohn's management. He is currently maintained on Stelara every 8 weeks. Scheduled for next colonoscopy in September 2023. His appetite and nausea have been decently controlled with dronabinol and promethazine.   - Continue current regimen per GI  - Follows with Dr. Pernell Dupre in pain management clinic for chronic opioid use.       2. Anxiety  Patient has history of anxiety and difficulty sleeping.  He currently takes Zyprexa 2.5 mg nightly which he reports improves his ability to fall and stay asleep. PHQ-2 screening was 0 today, patient declined GAD-7 evaluation today.   - Patient was previously started on citalopram 10 mg that he reports taking as needed without relief. I discussed that this medication should be taken daily rather than as needed for it to work best. He is not interested in a refill at this time.     3. Healthcare maintenance  Patient declined COVID and pneumococcal vaccines today. He is up to date on other HCM screening measures.     No follow-ups on file.    Staffed with Dr. Rosita Fire, seen and discussed    __________________________________________________________    HPI:    Patient has no acute questions or concerns today and presents to clinic to establish care with new PCP.  He feels well today aside from his chronic abdominal pain due to Crohn's disease. He is on chronic opioid therapy for his pain that is managed by Dr. Pernell Dupre at a pain management clinic.  He follows closely with Dr. Elizebeth Brooking and GI for his Crohn's disease.  He is currently on Stelara which he reports has been working well for him so far.  He has a history of poor appetite and chronic nausea but is now taking dronabinol and promethazine which have helped significantly.    I attempted to discuss patient's history of anxiety with him today, but he would prefer not to discuss this today and would like to wait until the next visit.  He does report that his current medications have been moderately helpful in managing his anxiety.  The Zyprexa has been helping him sleep at night.     He lives at home with his wife.  No children.  No alcohol, tobacco, or recreational drug use.  He does not work and is on disability. He otherwise feels well and has no concerns today. Denies chest pain, dyspnea, headaches, fevers/chills, or changes in bowel habits.       Medications:  Reviewed in EPIC  __________________________________________________________    Physical Exam:   Vital Signs:  There were no vitals filed for this visit.       General: No acute distress. Sitting comfortably in chair.  Cardiovascular: RRR, without murmurs, gallops, or rubs.   Lungs: Lungs clear bilaterally without rales, ronchi, or wheezes. Normal work of breathing.  Abdomen: Soft and non-tender. No distension. No hepatomegaly or splenomegaly.  Skin: Warm, dry and intact without rashes or lesions.  Neuro: Alert and oriented x 3. Normal speech. Normal gait.       PHQ-9 Score: 0          Medication adherence and barriers to  the treatment plan have been addressed. Opportunities to optimize healthy behaviors have been discussed. Patient / caregiver voiced understanding.

## 2021-08-01 MED ORDER — OLANZAPINE 2.5 MG TABLET
ORAL_TABLET | 0 refills | 0 days
Start: 2021-08-01 — End: ?

## 2021-08-02 NOTE — Unmapped (Signed)
I saw and evaluated the patient, participating in the key portions of the service.  I reviewed the resident???s note.  I agree with the resident???s findings and plan. Danielle Dess, MD

## 2021-08-06 MED ORDER — OLANZAPINE 2.5 MG TABLET
ORAL_TABLET | Freq: Every evening | ORAL | 0 refills | 30 days | Status: CP
Start: 2021-08-06 — End: 2021-09-05

## 2021-08-13 MED ORDER — OLANZAPINE 2.5 MG TABLET
ORAL_TABLET | 0 refills | 0 days
Start: 2021-08-13 — End: ?

## 2021-08-13 NOTE — Unmapped (Signed)
Refilled 7/10 by Dr. Elizebeth Brooking.

## 2021-08-22 ENCOUNTER — Ambulatory Visit
Admit: 2021-08-22 | Payer: MEDICARE | Attending: Student in an Organized Health Care Education/Training Program | Primary: Student in an Organized Health Care Education/Training Program

## 2021-08-28 MED ORDER — OLANZAPINE 2.5 MG TABLET
ORAL_TABLET | 0 refills | 0 days
Start: 2021-08-28 — End: ?

## 2021-08-29 ENCOUNTER — Ambulatory Visit: Payer: Medicare Other | Attending: Anesthesiology | Admitting: Anesthesiology

## 2021-08-29 ENCOUNTER — Encounter: Payer: Self-pay | Admitting: Anesthesiology

## 2021-08-29 DIAGNOSIS — G894 Chronic pain syndrome: Secondary | ICD-10-CM

## 2021-08-29 DIAGNOSIS — M4696 Unspecified inflammatory spondylopathy, lumbar region: Secondary | ICD-10-CM

## 2021-08-29 DIAGNOSIS — K5090 Crohn's disease, unspecified, without complications: Secondary | ICD-10-CM | POA: Diagnosis not present

## 2021-08-29 DIAGNOSIS — M47816 Spondylosis without myelopathy or radiculopathy, lumbar region: Secondary | ICD-10-CM

## 2021-08-29 DIAGNOSIS — G8929 Other chronic pain: Secondary | ICD-10-CM

## 2021-08-29 DIAGNOSIS — M542 Cervicalgia: Secondary | ICD-10-CM | POA: Diagnosis not present

## 2021-08-29 DIAGNOSIS — R109 Unspecified abdominal pain: Secondary | ICD-10-CM | POA: Diagnosis not present

## 2021-08-29 DIAGNOSIS — F1190 Opioid use, unspecified, uncomplicated: Secondary | ICD-10-CM | POA: Diagnosis not present

## 2021-08-29 MED ORDER — OXYCODONE-ACETAMINOPHEN 10-325 MG PO TABS: 1.0000 | ORAL_TABLET | Freq: Three times a day (TID) | ORAL | 0 refills | Status: DC | PRN

## 2021-08-29 MED ORDER — OXYCODONE-ACETAMINOPHEN 10-325 MG PO TABS: 1.0000 | ORAL_TABLET | Freq: Three times a day (TID) | ORAL | 0 refills | Status: AC | PRN

## 2021-08-29 NOTE — Progress Notes (Signed)
Virtual Visit via Telephone Note  I connected with [name] on 08/29/21 at  3:35 PM EDT by telephone and verified that I am speaking with the correct person using two identifiers.  Location: Patient: Home Provider: Pain control center   I discussed the limitations, risks, security and privacy concerns of performing an evaluation and management service by telephone and the availability of in person appointments. I also discussed with the patient that there may be a patient responsible charge related to this service. The patient expressed understanding and agreed to proceed.   History of Present Illness: I spoke with Benjamin Murillo and right via telephone as we are unable link for the video portion of the conference but he reports that his belly pain has been doing well as long as he is avoiding greasy food and the Percocet are continuing to help with his abdominal pain and chronic neck pain.  He is due to see a neurosurgeon in the next few weeks regarding some ongoing neck pain.  In regards to the quality characteristic and distribution of his abdominal pain this has improved over the last few months and the pain is well and the pain is well managed with our current regimen.  No side effects are reported are reported with his medication   Observations/Objective:  Current Outpatient Medications:    [START ON 10/01/2021] oxyCODONE-acetaminophen (PERCOCET) 10-325 MG tablet, Take 1 tablet by mouth every 8 (eight) hours as needed for pain., Disp: 75 tablet, Rfl: 0   amlodipine-atorvastatin (CADUET) 10-10 MG tablet, Take 1 tablet by mouth daily., Disp: , Rfl:    citalopram (CELEXA) 10 MG tablet, Take 10 mg by mouth daily as needed., Disp: , Rfl:    dronabinol (MARINOL) 10 MG capsule, Take 10 mg by mouth 2 (two) times daily before a meal., Disp: , Rfl:    famotidine (PEPCID) 20 MG tablet, Take 20 mg by mouth 2 (two) times daily., Disp: , Rfl:    gabapentin (NEURONTIN) 100 MG capsule, Take 1 capsule (100  mg total) by mouth at bedtime., Disp: 30 capsule, Rfl: 3   gabapentin (NEURONTIN) 300 MG capsule, Take 1 capsule (300 mg total) by mouth 3 (three) times daily., Disp: 90 capsule, Rfl: 3   hyoscyamine (LEVSIN, ANASPAZ) 0.125 MG tablet, Take 0.125 mg by mouth as needed. , Disp: , Rfl:    loperamide (IMODIUM A-D) 2 MG tablet, Take 2 mg by mouth as needed., Disp: , Rfl:    naloxone (NARCAN) nasal spray 4 mg/0.1 mL, For respiratory depression from opioids, Disp: 1 kit, Rfl: 2   [START ON 09/01/2021] oxyCODONE-acetaminophen (PERCOCET) 10-325 MG tablet, Take 1 tablet by mouth every 8 (eight) hours as needed for pain., Disp: 75 tablet, Rfl: 0   predniSONE (STERAPRED UNI-PAK 21 TAB) 10 MG (21) TBPK tablet, 6 tablets day one and day two, five tablets day three and day four, and daily taper over 12days as directed, Disp: 42 tablet, Rfl: 0   prochlorperazine (COMPAZINE) 5 MG tablet, Take 5 mg by mouth every 8 (eight) hours as needed., Disp: , Rfl:    STELARA 90 MG/ML SOSY injection, Inject 90 mg as directed., Disp: , Rfl:    Past Medical History:  Diagnosis Date   Cervical radiculitis 12/22/2019   Cervical radiculopathy    Chronic abdominal pain    Chronic neck pain    Cold    Crohn disease (HCC)    GERD (gastroesophageal reflux disease)    no meds-2-3 times weekly   Hand  fracture, left    Headache(784.0)    Hypertension    no meds    IBS (irritable bowel syndrome)    Shortness of breath    "sometimes laying down; sometimes w/activity" (10/13/2012)     Assessment and Plan: 1. Cervicalgia   2. Crohn's disease without complication, unspecified gastrointestinal tract location ([place])   3. Chronic pain syndrome   4. Chronic, continuous use of opioids   5. Chronic abdominal pain   6. Facet arthritis of lumbar region   Based on our discussion today I think it is appropriate to refill his medications for the next 2 months dated for August 5 and symptoms before.  No other changes in his pharmacologic  regimen will be initiated.  I have reviewed the [facility] practitioner database information and appears appropriate and he appears to be gaining good functional lifestyle improvement with his chronic opioid management which is gainful and well-tolerated.  Continue follow-up with his primary care physicians for routine medical care with return to clinic in 2 months.  Follow Up Instructions:    I discussed the assessment and treatment plan with the patient. The patient was provided an opportunity to ask questions and all were answered. The patient agreed with the plan and demonstrated an understanding of the instructions.   The patient was advised to call back or seek an in-person evaluation if the symptoms worsen or if the condition fails to improve as anticipated.  I provided 30 minutes of non-face-to-face time during this encounter.   [name], MD

## 2021-08-31 MED ORDER — OLANZAPINE 2.5 MG TABLET
ORAL_TABLET | Freq: Every evening | ORAL | 0 refills | 30 days
Start: 2021-08-31 — End: 2021-09-30

## 2021-09-03 MED ORDER — OLANZAPINE 2.5 MG TABLET
ORAL_TABLET | Freq: Every evening | ORAL | 0 refills | 30.00000 days | Status: CP
Start: 2021-09-03 — End: 2021-10-03

## 2021-09-13 NOTE — Unmapped (Signed)
[facility] Refill Coordination Note    Specialty Medication(s) to be Shipped:   Inflammatory Disorders: Stelara    Other medication(s) to be shipped: No additional medications requested for fill at this time     Rodney Bender, DOB: 1970-09-03  Phone: [phone] (home)       All above HIPAA information was verified with patient.     Was a Nurse, learning disability used for this call? No    Completed refill call assessment today to schedule patient's medication shipment from the [facility] [phone]).  All relevant notes have been reviewed.     Specialty medication(s) and dose(s) confirmed: Regimen is correct and unchanged.   Changes to medications: [name] reports no changes at this time.  Changes to insurance: No  New side effects reported not previously addressed with a pharmacist or physician: None reported  Questions for the pharmacist: No    Confirmed patient received a Conservation officer, historic buildings and a Surveyor, mining with first shipment. The patient will receive a drug information handout for each medication shipped and additional FDA Medication Guides as required.       DISEASE/MEDICATION-SPECIFIC INFORMATION        For patients on injectable medications: Patient currently has 0 doses left.  Next injection is scheduled for 09/22/21.    SPECIALTY MEDICATION ADHERENCE     Medication Adherence    Patient reported X missed doses in the last month: 0  Specialty Medication: STELARA 90 mg/mL  Patient is on additional specialty medications: No                          Were doses missed due to medication being on hold? No    Stelara 90 mg/ml: 0 days of medicine on hand        REFERRAL TO PHARMACIST     Referral to the pharmacist: Not needed      [facility]     Shipping address confirmed in Epic.     Delivery Scheduled: Yes, Expected medication delivery date: 09/20/21.     Medication will be delivered via Same Day Courier to the prescription address in Epic WAM.    [name]   [facility] Specialty Technician

## 2021-09-20 ENCOUNTER — Ambulatory Visit: Admit: 2021-09-20 | Discharge: 2021-09-21 | Payer: MEDICARE

## 2021-09-20 MED FILL — STELARA 90 MG/ML SUBCUTANEOUS SYRINGE: SUBCUTANEOUS | 56 days supply | Qty: 1 | Fill #3

## 2021-09-23 MED ORDER — DRONABINOL 10 MG CAPSULE
ORAL_CAPSULE | 0 refills | 0 days
Start: 2021-09-23 — End: ?

## 2021-09-23 MED ORDER — GABAPENTIN 300 MG CAPSULE
ORAL_CAPSULE | Freq: Three times a day (TID) | ORAL | 11 refills | 30 days
Start: 2021-09-23 — End: 2022-09-23

## 2021-09-28 MED ORDER — GABAPENTIN 300 MG CAPSULE
ORAL_CAPSULE | Freq: Three times a day (TID) | ORAL | 11 refills | 30 days | Status: CP
Start: 2021-09-28 — End: 2022-09-28

## 2021-09-28 MED ORDER — DRONABINOL 10 MG CAPSULE
ORAL_CAPSULE | Freq: Two times a day (BID) | ORAL | 0 refills | 30 days | Status: CP
Start: 2021-09-28 — End: 2021-10-28

## 2021-10-08 ENCOUNTER — Emergency Department
Admission: EM | Admit: 2021-10-08 | Discharge: 2021-10-08 | Disposition: A | Discharge status: Home / Self Care | Payer: Medicare Other | Attending: Emergency Medicine | Admitting: Emergency Medicine

## 2021-10-08 DIAGNOSIS — Z5321 Procedure and treatment not carried out due to patient leaving prior to being seen by health care provider: Secondary | ICD-10-CM | POA: Diagnosis not present

## 2021-10-08 DIAGNOSIS — H5789 Other specified disorders of eye and adnexa: Secondary | ICD-10-CM | POA: Diagnosis present

## 2021-10-08 NOTE — ED Triage Notes (Signed)
Pt reports possible foreign body in left eye. Unknown object. Watering and redness began this morning.  Rt eye 20/40 Left eye 20/70

## 2021-10-11 MED ORDER — BUSPIRONE 10 MG TABLET
ORAL_TABLET | Freq: Three times a day (TID) | ORAL | 11 refills | 30 days | Status: CP
Start: 2021-10-11 — End: 2022-10-11

## 2021-10-12 MED ORDER — BISACODYL 5 MG TABLET,DELAYED RELEASE
ORAL_TABLET | Freq: Once | ORAL | 0 refills | 1 days | Status: CP
Start: 2021-10-12 — End: 2021-10-12

## 2021-10-12 MED ORDER — SIMETHICONE 125 MG CHEWABLE TABLET
ORAL_TABLET | 0 refills | 0 days | Status: CP
Start: 2021-10-12 — End: ?

## 2021-10-12 MED ORDER — POLYETHYLENE GLYCOL 3350 17 GRAM/DOSE ORAL POWDER
Freq: Once | ORAL | 0 refills | 1 days | Status: CP
Start: 2021-10-12 — End: 2021-10-12

## 2021-10-12 NOTE — Unmapped (Signed)
Spoke with Rodney Bender regarding upcoming GI procedure on 9/21//23 at [facility] location at 1015 with an arrival time of 0915    Reviewed registration in [place] and [place] at time of arrival.    Verified that [facility] prep instructions had been received, reviewed and understood. Discussed early prep option.     Verified prep medications ordered/received.    Reviewed information regarding holding vitamins/supplements the day before and day of procedure.    Reviewed low fiber diet should have started three days prior to procedure.    Reviewed LIQUID diet requirement entire day prior to procedure as well as NOTHING AT ALL 2 hours prior to procedure    Reviewed driver requirement (18 or older, must be able to drive patient home, must remain within 20 minutes of facility and reachable by cell phone).      No further questions at this time.

## 2021-10-15 ENCOUNTER — Emergency Department
Admission: EM | Admit: 2021-10-15 | Discharge: 2021-10-15 | Disposition: A | Discharge status: Home / Self Care | Payer: Medicare Other | Attending: Emergency Medicine | Admitting: Emergency Medicine

## 2021-10-15 ENCOUNTER — Other Ambulatory Visit: Payer: Self-pay

## 2021-10-15 DIAGNOSIS — S0502XA Injury of conjunctiva and corneal abrasion without foreign body, left eye, initial encounter: Secondary | ICD-10-CM | POA: Insufficient documentation

## 2021-10-15 DIAGNOSIS — X58XXXA Exposure to other specified factors, initial encounter: Secondary | ICD-10-CM | POA: Insufficient documentation

## 2021-10-15 DIAGNOSIS — S0592XA Unspecified injury of left eye and orbit, initial encounter: Secondary | ICD-10-CM | POA: Diagnosis present

## 2021-10-15 MED ORDER — ERYTHROMYCIN 5 MG/GM OP OINT: 1.0000 | TOPICAL_OINTMENT | Freq: Every day | OPHTHALMIC | 0 refills | Status: AC

## 2021-10-15 MED ORDER — FLUORESCEIN SODIUM 1 MG OP STRP
1.0000 | ORAL_STRIP | Freq: Once | OPHTHALMIC | Status: AC
  Administered 2021-10-15: 1 via OPHTHALMIC
  Filled 2021-10-15: qty 1

## 2021-10-15 MED ORDER — PROPARACAINE HCL 0.5 % OP SOLN
1.0000 [drp] | Freq: Once | OPHTHALMIC | Status: AC
Start: 2021-10-15 — End: 2021-10-15
  Administered 2021-10-15: 1 [drp] via OPHTHALMIC
  Filled 2021-10-15: qty 15

## 2021-10-15 NOTE — Discharge Instructions (Addendum)
Use the ointment as prescribed.  Please return for any new, worsening, or change in symptoms or other concerns.  Please follow-up with the ophthalmologist if your symptoms persist.

## 2021-10-15 NOTE — ED Provider Notes (Signed)
[facility] Provider Note    Event Date/Time   First MD Initiated Contact with Patient 10/15/21 1629     (approximate)   History   Eye Pain   HPI  Benjamin Murillo is a 51 y.o. male who presents today for evaluation of possible foreign body in his left eye.  Patient reports that he first noticed this 1 week ago.  He reports that he notices it only when he moves his eyes in certain directions.  He does not member something being flown into his eye.  He denies any change in his vision.  He does not wear contact lenses.  He has noticed some increased tearing occasionally, though none currently.  Patient Active Problem List   Diagnosis Date Noted   Cervical radiculitis 12/22/2019   Shoulder pain, right 12/09/2018   Encounter for chronic pain management 08/13/2018   Malaise and fatigue 03/19/2018   Clostridium difficile enteritis 12/09/2017   Crohn's disease of both small and large intestine with other complication ([place]) 86/57/8469   Chronic pain syndrome 06/27/2016   Long term current use of opiate analgesic 06/27/2016   S/P excision of lipoma 02/07/2016   Chronic abdominal pain 11/23/2015   AP (abdominal pain)    Crohn disease ([place])    Crohn's colitis ([place]) 01/03/2014   HNP (herniated nucleus pulposus), cervical 02/12/2013   Tobacco abuse 10/14/2012   Nonadherence to medical treatment 10/14/2012   Acute abdominal pain 10/13/2012   Crohn's ileitis ([place]) 06/06/2012   Disorder of intestine 10/26/2010   Disorder of stomach and duodenum 10/26/2010   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   Duodenal ulcer 05/22/2010   Drug dependence ([place]) 03/25/2009   Mixed, or nondependent drug abuse, in remission ([place]) 03/25/2009   Personal history of digestive disease 02/05/2008   Nausea with vomiting 12/31/2004   Chronic nausea 12/31/2004   Diarrhea 01/18/2004          Physical Exam   Triage Vital Signs: ED Triage Vitals  Enc Vitals Group      BP 10/15/21 1615 119/85     Pulse Rate 10/15/21 1615 68     Resp 10/15/21 1615 16     Temp 10/15/21 1615 98.3 F (36.8 C)     Temp Source 10/15/21 1615 Oral     SpO2 10/15/21 1615 97 %     Weight --      Height --      Head Circumference --      Peak Flow --      Pain Score 10/15/21 1613 3     Pain Loc --      Pain Edu? --      Excl. in [place]? --     Most recent vital signs: Vitals:   10/15/21 1615  BP: 119/85  Pulse: 68  Resp: 16  Temp: 98.3 F (36.8 C)  SpO2: 97%    Physical Exam Vitals and nursing note reviewed.  Constitutional:      General: Awake and alert. No acute distress.    Appearance: Normal appearance. The patient is normal weight.  HENT:     Head: Normocephalic and atraumatic.     Mouth: Mucous membranes are moist.  Eyes:     General: PERRL. Normal EOMs        Right eye: No discharge.        Left eye: No discharge.  No hyphema or hypopyon.  No proptosis.  No periorbital ecchymosis or erythema.  Eyelids  everted, no retained foreign body noted.  There is a small area of punctate fluorescein uptake at the 8 to 9 o'clock position.  Normal lids and lashes.  Ocular pressure is 12.    Conjunctiva/sclera: Conjunctivae normal.  Cardiovascular:     Rate and Rhythm: Normal rate and regular rhythm.     Pulses: Normal pulses.     Heart sounds: Normal heart sounds Pulmonary:     Effort: Pulmonary effort is normal. No respiratory distress.  Abdominal:     Abdomen is soft. . Musculoskeletal:        General: No swelling. Normal range of motion.     Cervical back: Normal range of motion and neck supple.  Skin:    General: Skin is warm and dry.     Capillary Refill: Capillary refill takes less than 2 seconds.     Findings: No rash.  Neurological:     Mental Status: The patient is awake and alert.      ED Results / Procedures / Treatments   Labs (all labs ordered are listed, but only abnormal results are displayed) Labs Reviewed - No data to  display   EKG     RADIOLOGY     PROCEDURES:  Critical Care performed:   Procedures   MEDICATIONS ORDERED IN ED: Medications  proparacaine (ALCAINE) 0.5 % ophthalmic solution 1 drop (1 drop Left Eye Given 10/15/21 1703)  fluorescein ophthalmic strip 1 strip (1 strip Left Eye Given 10/15/21 1702)     IMPRESSION / MDM / ASSESSMENT AND PLAN / ED COURSE  I reviewed the triage vital signs and the nursing notes.   Differential diagnosis includes, but is not limited to, retained foreign body, corneal abrasion, conjunctivitis, keratitis.  Patient is awake and alert, hemodynamically stable and afebrile.  No proptosis, periorbital edema or erythema to suggest preseptal or orbital cellulitis.  He has full normal extraocular movements.  He has no hyphema or hypopyon.  He does not wear contact lenses.  There is a very small area of fluorescein uptake at the 8 to 9 o'clock position consistent with a corneal abrasion.  There is a negative Seidel sign, not consistent with open globe.  No punctate fluorescein uptake to suggest keratitis.  Eyelids were inverted, there is no retained foreign body noted.  Normal ocular pressure, not consistent with acute angle-closure glaucoma.  He was started on erythromycin ointment for the corneal abrasion.  He was instructed to follow-up with ophthalmology if his symptoms persist and the appropriate information was provided.  We discussed tricked return precautions and the importance of close outpatient follow-up.  Patient understands and agrees with plan.  He was discharged in stable condition.  All questions were answered.   Patient's presentation is most consistent with acute illness / injury with system symptoms.     FINAL CLINICAL IMPRESSION(S) / ED DIAGNOSES   Final diagnoses:  Abrasion of left cornea, initial encounter     Rx / DC Orders   ED Discharge Orders          Ordered    erythromycin ophthalmic ointment  Daily at bedtime        10/15/21  1711             Note:  This document was prepared using Dragon voice recognition software and may include unintentional dictation errors.   Emeline Gins 10/15/21 1742    Carrie Mew, MD 10/16/21 2340

## 2021-10-15 NOTE — ED Triage Notes (Signed)
Pt comes with c/o something in his left ye. Pt unsure of what it is. Pt states his eyes are watering.

## 2021-10-16 MED ORDER — OLANZAPINE 2.5 MG TABLET
ORAL_TABLET | Freq: Every evening | ORAL | 5 refills | 30 days
Start: 2021-10-16 — End: ?

## 2021-10-16 NOTE — Unmapped (Signed)
Received refill request for olanzapine.  Pt has been receiving 30 days-at-a-time refills from Dr. Elizebeth Brooking for this recently.  Also recently prescribed buspar and chart reveal historical mention of catalopram, but notes with other providers reveal pt not taking this.  Olanzipine not included on nurse refill standing order. Routing to Dr. Elizebeth Brooking for his consideration.

## 2021-10-17 MED ORDER — OLANZAPINE 2.5 MG TABLET
ORAL_TABLET | Freq: Every evening | ORAL | 5 refills | 30 days | Status: CP
Start: 2021-10-17 — End: ?

## 2021-10-18 ENCOUNTER — Ambulatory Visit: Admit: 2021-10-18 | Discharge: 2021-10-22 | Payer: MEDICAID

## 2021-10-18 ENCOUNTER — Encounter
Admit: 2021-10-18 | Discharge: 2021-10-22 | Payer: MEDICAID | Attending: Nurse Practitioner | Primary: Nurse Practitioner

## 2021-10-18 MED ORDER — BUSPIRONE 10 MG TABLET
ORAL_TABLET | Freq: Three times a day (TID) | ORAL | 0 refills | 30 days | Status: CP
Start: 2021-10-18 — End: 2021-11-17

## 2021-10-18 MED ADMIN — propofoL (DIPRIVAN) injection: INTRAVENOUS | @ 15:00:00 | Stop: 2021-10-18

## 2021-10-18 MED ADMIN — glycopyrrolate (ROBINUL) injection: INTRAVENOUS | @ 15:00:00 | Stop: 2021-10-18

## 2021-10-18 MED ADMIN — propofol (DIPRIVAN) infusion 10 mg/mL: INTRAVENOUS | @ 15:00:00 | Stop: 2021-10-18

## 2021-10-18 MED ADMIN — lactated Ringers infusion: 10 mL/h | INTRAVENOUS | @ 14:00:00

## 2021-10-18 MED ADMIN — fentaNYL (PF) (SUBLIMAZE) injection: INTRAVENOUS | @ 15:00:00 | Stop: 2021-10-18

## 2021-10-18 MED ADMIN — lidocaine (XYLOCAINE) 20 mg/mL (2 %) injection: INTRAVENOUS | @ 15:00:00 | Stop: 2021-10-18

## 2021-10-24 ENCOUNTER — Ambulatory Visit: Admit: 2021-10-24 | Discharge: 2021-10-25 | Payer: MEDICAID

## 2021-10-24 LAB — CBC W/ AUTO DIFF
BASOPHILS ABSOLUTE COUNT: 0 10*9/L (ref 0.0–0.1)
BASOPHILS RELATIVE PERCENT: 0.6 %
EOSINOPHILS ABSOLUTE COUNT: 0.1 10*9/L (ref 0.0–0.5)
EOSINOPHILS RELATIVE PERCENT: 1 %
HEMATOCRIT: 33.7 % — ABNORMAL LOW (ref 39.0–48.0)
HEMOGLOBIN: 11.6 g/dL — ABNORMAL LOW (ref 12.9–16.5)
LYMPHOCYTES ABSOLUTE COUNT: 2.3 10*9/L (ref 1.1–3.6)
LYMPHOCYTES RELATIVE PERCENT: 31 %
MEAN CORPUSCULAR HEMOGLOBIN CONC: 34.5 g/dL (ref 32.0–36.0)
MEAN CORPUSCULAR HEMOGLOBIN: 33 pg — ABNORMAL HIGH (ref 25.9–32.4)
MEAN CORPUSCULAR VOLUME: 95.6 fL (ref 77.6–95.7)
MEAN PLATELET VOLUME: 7.5 fL (ref 6.8–10.7)
MONOCYTES ABSOLUTE COUNT: 0.6 10*9/L (ref 0.3–0.8)
MONOCYTES RELATIVE PERCENT: 8.7 %
NEUTROPHILS ABSOLUTE COUNT: 4.4 10*9/L (ref 1.8–7.8)
NEUTROPHILS RELATIVE PERCENT: 58.7 %
NUCLEATED RED BLOOD CELLS: 0 /100{WBCs} (ref <=4)
PLATELET COUNT: 248 10*9/L (ref 150–450)
RED BLOOD CELL COUNT: 3.53 10*12/L — ABNORMAL LOW (ref 4.26–5.60)
RED CELL DISTRIBUTION WIDTH: 13.4 % (ref 12.2–15.2)
WBC ADJUSTED: 7.5 10*9/L (ref 3.6–11.2)

## 2021-10-24 LAB — MAGNESIUM: MAGNESIUM: 1.9 mg/dL (ref 1.6–2.6)

## 2021-10-24 LAB — COMPREHENSIVE METABOLIC PANEL
ALBUMIN: 4.1 g/dL (ref 3.4–5.0)
ALKALINE PHOSPHATASE: 86 U/L (ref 46–116)
ALT (SGPT): 14 U/L (ref 10–49)
ANION GAP: <1 mmol/L — ABNORMAL LOW (ref 5–14)
AST (SGOT): 18 U/L (ref <=34)
BILIRUBIN TOTAL: 0.5 mg/dL (ref 0.3–1.2)
BLOOD UREA NITROGEN: 9 mg/dL (ref 9–23)
BUN / CREAT RATIO: 9
CALCIUM: 9.7 mg/dL (ref 8.7–10.4)
CHLORIDE: 108 mmol/L — ABNORMAL HIGH (ref 98–107)
CO2: 31.3 mmol/L — ABNORMAL HIGH (ref 20.0–31.0)
CREATININE: 0.99 mg/dL
EGFR CKD-EPI (2021) MALE: >90 mL/min/{1.73_m2} (ref >=60)
GLUCOSE RANDOM: 93 mg/dL (ref 70–179)
POTASSIUM: 4.3 mmol/L (ref 3.4–4.8)
PROTEIN TOTAL: 7.2 g/dL (ref 5.7–8.2)
SODIUM: 139 mmol/L (ref 135–145)

## 2021-10-24 LAB — FERRITIN: FERRITIN: 87 ng/mL

## 2021-10-24 LAB — TSH: THYROID STIMULATING HORMONE: 0.509 u[IU]/mL — ABNORMAL LOW (ref 0.550–4.780)

## 2021-10-24 MED ORDER — GABAPENTIN 300 MG CAPSULE
ORAL_CAPSULE | Freq: Three times a day (TID) | ORAL | 11 refills | 30 days | Status: CP
Start: 2021-10-24 — End: 2022-10-24

## 2021-10-25 ENCOUNTER — Ambulatory Visit: Payer: Medicare Other | Attending: Anesthesiology | Admitting: Anesthesiology

## 2021-10-25 DIAGNOSIS — K5090 Crohn's disease, unspecified, without complications: Secondary | ICD-10-CM | POA: Diagnosis not present

## 2021-10-25 DIAGNOSIS — Z79891 Long term (current) use of opiate analgesic: Secondary | ICD-10-CM

## 2021-10-25 DIAGNOSIS — M5412 Radiculopathy, cervical region: Secondary | ICD-10-CM

## 2021-10-25 DIAGNOSIS — G894 Chronic pain syndrome: Secondary | ICD-10-CM | POA: Diagnosis not present

## 2021-10-25 DIAGNOSIS — R109 Unspecified abdominal pain: Secondary | ICD-10-CM

## 2021-10-25 DIAGNOSIS — M542 Cervicalgia: Secondary | ICD-10-CM | POA: Diagnosis not present

## 2021-10-25 DIAGNOSIS — G8929 Other chronic pain: Secondary | ICD-10-CM

## 2021-10-25 DIAGNOSIS — M549 Dorsalgia, unspecified: Secondary | ICD-10-CM

## 2021-10-25 DIAGNOSIS — F1190 Opioid use, unspecified, uncomplicated: Secondary | ICD-10-CM

## 2021-10-25 DIAGNOSIS — M47816 Spondylosis without myelopathy or radiculopathy, lumbar region: Secondary | ICD-10-CM

## 2021-10-25 MED ORDER — OXYCODONE-ACETAMINOPHEN 10-325 MG PO TABS: 1.0000 | ORAL_TABLET | Freq: Three times a day (TID) | ORAL | 0 refills | Status: DC | PRN

## 2021-10-25 MED ORDER — OXYCODONE-ACETAMINOPHEN 10-325 MG PO TABS: 1.0000 | ORAL_TABLET | Freq: Three times a day (TID) | ORAL | 0 refills | Status: AC | PRN

## 2021-10-26 ENCOUNTER — Encounter: Payer: Self-pay | Admitting: Anesthesiology

## 2021-10-26 LAB — VITAMIN D 25 HYDROXY: VITAMIN D, TOTAL (25OH): 26.7 ng/mL (ref 20.0–80.0)

## 2021-10-26 NOTE — Progress Notes (Signed)
Virtual Visit via Telephone Note  I connected with Benjamin Murillo on 10/26/21 at  1:20 PM EDT by telephone and verified that I am speaking with the correct person using two identifiers.  Location: Patient: Home Provider: Pain control center   I discussed the limitations, risks, security and privacy concerns of performing an evaluation and management service by telephone and the availability of in person appointments. I also discussed with the patient that there may be a patient responsible charge related to this service. The patient expressed understanding and agreed to proceed.   History of Present Illness: I spoke with Benjamin Murillo via telephone as we are unable link for the video portion of the conference.  He still having a fair amount of low back and neck pain comparable to what he is experienced in the past.  He also has chronic abdominal pain for which he takes his chronic opioid maintenance therapy.  He takes his medications as prescribed with no recent changes noted in the quality characteristic or distribution of his pain.  He is trying to do some stretching strengthening exercises for his back and these are sometimes difficult for core strengthening.  No change in upper or lower extremity strength is noted.  He is trying to stay active throughout the day and take his medications as prescribed which continue to work well for him giving him about 75 to 80% improvement for about 4 to 6 hours after he takes them.  Otherwise he is in his usual state of health.  Review of systems: General: No fevers or chills Pulmonary: No shortness of breath or dyspnea Cardiac: No angina or palpitations or lightheadedness GI: No abdominal pain or constipation Psych: No depression    Observations/Objective:  Current Outpatient Medications:    [START ON 11/30/2021] oxyCODONE-acetaminophen (PERCOCET) 10-325 MG tablet, Take 1 tablet by mouth every 8 (eight) hours as needed for pain., Disp: 75 tablet, Rfl: 0    amlodipine-atorvastatin (CADUET) 10-10 MG tablet, Take 1 tablet by mouth daily., Disp: , Rfl:    citalopram (CELEXA) 10 MG tablet, Take 10 mg by mouth daily as needed., Disp: , Rfl:    dronabinol (MARINOL) 10 MG capsule, Take 10 mg by mouth 2 (two) times daily before a meal., Disp: , Rfl:    erythromycin ophthalmic ointment, Place 1 Application into the left eye at bedtime., Disp: 3.5 g, Rfl: 0   famotidine (PEPCID) 20 MG tablet, Take 20 mg by mouth 2 (two) times daily., Disp: , Rfl:    gabapentin (NEURONTIN) 100 MG capsule, Take 1 capsule (100 mg total) by mouth at bedtime., Disp: 30 capsule, Rfl: 3   gabapentin (NEURONTIN) 300 MG capsule, Take 1 capsule (300 mg total) by mouth 3 (three) times daily., Disp: 90 capsule, Rfl: 3   hyoscyamine (LEVSIN, ANASPAZ) 0.125 MG tablet, Take 0.125 mg by mouth as needed. , Disp: , Rfl:    loperamide (IMODIUM A-D) 2 MG tablet, Take 2 mg by mouth as needed., Disp: , Rfl:    naloxone (NARCAN) nasal spray 4 mg/0.1 mL, For respiratory depression from opioids, Disp: 1 kit, Rfl: 2   [START ON 10/31/2021] oxyCODONE-acetaminophen (PERCOCET) 10-325 MG tablet, Take 1 tablet by mouth every 8 (eight) hours as needed for pain., Disp: 75 tablet, Rfl: 0   predniSONE (STERAPRED UNI-PAK 21 TAB) 10 MG (21) TBPK tablet, 6 tablets day one and day two, five tablets day three and day four, and daily taper over 12days as directed, Disp: 42 tablet, Rfl: 0  prochlorperazine (COMPAZINE) 5 MG tablet, Take 5 mg by mouth every 8 (eight) hours as needed., Disp: , Rfl:    STELARA 90 MG/ML SOSY injection, Inject 90 mg as directed., Disp: , Rfl:    Past Medical History:  Diagnosis Date   Cervical radiculitis 12/22/2019   Cervical radiculopathy    Chronic abdominal pain    Chronic neck pain    Cold    Crohn disease (HCC)    GERD (gastroesophageal reflux disease)    no meds-2-3 times weekly   Hand fracture, left    Headache(784.0)    Hypertension    no meds    IBS (irritable bowel  syndrome)    Shortness of breath    "sometimes laying down; sometimes w/activity" (10/13/2012)     Assessment and Plan: 1. Cervicalgia   2. Crohn's disease without complication, unspecified gastrointestinal tract location ([place])   3. Chronic pain syndrome   4. Chronic, continuous use of opioids   5. Chronic abdominal pain   6. Facet arthritis of lumbar region   7. Chronic neck and back pain   8. Cervical radiculitis   9. Long term current use of opiate analgesic   Based on our discussion today I think it is appropriate to refill his medicines for the next 2 months.  There is a be dated for October for November 3.  No other changes in his pharmacologic regimen will be initiated.  Continue core stretching strengthening exercises additionally with return to clinic scheduled in 2 months.  Continue follow-up with his primary care physicians and GI doctors for routine follow-up.  Follow Up Instructions:    I discussed the assessment and treatment plan with the patient. The patient was provided an opportunity to ask questions and all were answered. The patient agreed with the plan and demonstrated an understanding of the instructions.   The patient was advised to call back or seek an in-person evaluation if the symptoms worsen or if the condition fails to improve as anticipated.  I provided 30 minutes of non-face-to-face time during this encounter.   [name], MD

## 2021-10-29 MED ORDER — PROMETHAZINE 25 MG TABLET
ORAL_TABLET | Freq: Four times a day (QID) | ORAL | 11 refills | 6 days | PRN
Start: 2021-10-29 — End: ?

## 2021-10-30 MED ORDER — PROMETHAZINE 25 MG TABLET
ORAL_TABLET | Freq: Four times a day (QID) | ORAL | 11 refills | 6 days | Status: CP | PRN
Start: 2021-10-30 — End: ?

## 2021-11-05 NOTE — Unmapped (Addendum)
MED [ ]  call about test results

## 2021-11-09 NOTE — Unmapped (Signed)
[facility] Refill Coordination Note    Specialty Medication(s) to be Shipped:   Inflammatory Disorders: Stelara    Other medication(s) to be shipped: No additional medications requested for fill at this time     Rodney Bender, DOB: 01/17/1971  Phone: [phone] (home)       All above HIPAA information was verified with patient.     Was a Nurse, learning disability used for this call? No    Completed refill call assessment today to schedule patient's medication shipment from the [facility] [phone]).  All relevant notes have been reviewed.     Specialty medication(s) and dose(s) confirmed: Regimen is correct and unchanged.   Changes to medications: Jurem reports no changes at this time.  Changes to insurance: No  New side effects reported not previously addressed with a pharmacist or physician: None reported  Questions for the pharmacist: No    Confirmed patient received a Conservation officer, historic buildings and a Surveyor, mining with first shipment. The patient will receive a drug information handout for each medication shipped and additional FDA Medication Guides as required.       DISEASE/MEDICATION-SPECIFIC INFORMATION        For patients on injectable medications: Patient currently has 0 doses left.  Next injection is scheduled for 10/21.    SPECIALTY MEDICATION ADHERENCE     Medication Adherence    Patient reported X missed doses in the last month: 0  Specialty Medication: STELARA 90 mg/mL  Patient is on additional specialty medications: No                          Were doses missed due to medication being on hold? No    Stelara 90 mg/ml: 0 days of medicine on hand        REFERRAL TO PHARMACIST     Referral to the pharmacist: Not needed      [facility]     Shipping address confirmed in Epic.     Delivery Scheduled: Yes, Expected medication delivery date: 11/15/21.     Medication will be delivered via Same Day Courier to the prescription address in Epic WAM.    [name]   [facility] Specialty Technician

## 2021-11-12 ENCOUNTER — Ambulatory Visit: Admit: 2021-11-12 | Discharge: 2021-11-13 | Payer: MEDICAID

## 2021-11-12 DIAGNOSIS — R6882 Decreased libido: Principal | ICD-10-CM

## 2021-11-12 DIAGNOSIS — N528 Other male erectile dysfunction: Principal | ICD-10-CM

## 2021-11-12 LAB — HEMATOCRIT: HEMATOCRIT: 38.9 % — ABNORMAL LOW (ref 39.0–48.0)

## 2021-11-12 LAB — ESTRADIOL(ESTROGEN) LEVEL: ESTRADIOL LEVEL: 13 pg/mL

## 2021-11-12 LAB — TESTOSTERONE: TESTOSTERONE TOTAL: 255 ng/dL

## 2021-11-12 LAB — PSA: PROSTATE SPECIFIC ANTIGEN: 0.47 ng/mL (ref 0.00–4.00)

## 2021-11-12 LAB — LUTEINIZING HORMONE: LUTEINIZING HORMONE: 13.1 m[IU]/mL

## 2021-11-12 LAB — PROLACTIN: PROLACTIN: 4.7 ng/mL

## 2021-11-12 LAB — FOLLICLE STIMULATING HORMONE: FOLLICLE STIMULATING HORMONE: 54.3 m[IU]/mL

## 2021-11-12 MED ORDER — SILDENAFIL 100 MG TABLET
ORAL_TABLET | Freq: Every day | ORAL | 3 refills | 90 days | Status: CP | PRN
Start: 2021-11-12 — End: 2022-11-12

## 2021-11-12 NOTE — Unmapped (Signed)
Assessment:   Diagnosis ICD-10-CM Associated Orders   1. Low libido  R68.82 Hematocrit     Testosterone     Estradiol (Estrogen) Level     Prolactin     Luteinizing hormone     Follicle stimulating hormone     PSA (Prostate Specific Antigen)      2. ED K50.818 Ambulatory referral to Urology        Patient is a 51 year old male with a history of Crohn's, hypertension, and anxiety presenting today for erectile dysfunction and low libido. He is dissatisfied with his current dosage of Viagra and would also like to be worked up for possible low testosterone. He believes he has had a recent low testosterone lab reported in the [facility] system, but it could not be found.      Plan:  -Discussed intervention options for erectile dysfunction. He will increase Viagra to 100 mg PRN. Discussed proper medication use. We discussed he may take this 30 minutes to four hours prior to intercourse to help with erections.  This medication is best on an empty stomach.  We also discussed he may not takes nitrates for chest pain with this medication and that he should go to the ED if he has an erection lasting more than four hours. ([email or web address] to create an account)  - Will draw a hormone profile today (hematocrit, e2, prolactin, lh, fsh, t, psa). Will call patient if testosterone is abnormal.    Referring Provider:  Dr. Elizebeth Brooking    PCP:   Theda Sers, MD    Reason for Visit:  Erectile dysfunction    HPI:  Rodney Bender  is a pleasant 51 y.o. year-old male who is being seen today for erectile dysfunction.    He reports issues with having and maintaining an erection for a few years now. He attempts IC weekly and has success with penetration less than 50% of the time. Currently taking Viagra 50mg  as needed for the last 6 months with no benefit. This is the first intervention he has tried. He takes it on an empty stomach 1 hour before intercourse with stimulation.    He also reports low libido. He states he has a complicated history of Crohn's which has significantly impacted his sex drive. He states his sex drive is bad all the time now, and his overall energy tends to be low. His GI doctor at [facility] ran tests 2-3 weeks ago; he says he believes they checked his testosterone and it was low. A testosterone could not be found in his chart.    SHIMs score: 10  Recent labs: TSH: 0.509 (low), Vit D: 26.7 (normal), Hgb: 11.6 (low), Hematocrit: 33.7 (low)    Has hypertension and states it is relatively well controlled. Denies diabetes. Denies current tobacco or drug use, other than MJ.    Today, he reports that he is doing well but feels tired. Today, he denies fever, chills, chest pain, shortness of breath, cough, wheezing, nausea, vomiting, abdominal pain, flank pain, diarrhea, constipation, dysuria, hematuria, rashes, open wounds, edema, and weakness or numbness in the upper or lower extremities.     Past medical history:  Past Medical History:   Diagnosis Date    Acid reflux     Anxiety     Crohn's disease (CMS-HCC)     diagnosed in 1990    GERD (gastroesophageal reflux disease)     Hypertension 10/08/2016       Past surgical history:  Past Surgical History:   Procedure  Laterality Date    COLON SURGERY      PR COLONOSCOPY FLX DX W/COLLJ SPEC WHEN PFRMD Left 11/10/2012    Procedure: COLONOSCOPY, FLEXIBLE, PROXIMAL TO SPLENIC FLEXURE; DIAGNOSTIC, W/WO COLLECTION SPECIMEN BY BRUSH OR WASH;  Surgeon: Malcolm Metro, MD;  Location: [facility];  Service: Gastroenterology    PR COLONOSCOPY FLX DX W/COLLJ SPEC WHEN PFRMD N/A 07/21/2013    Procedure: COLONOSCOPY, FLEXIBLE, PROXIMAL TO SPLENIC FLEXURE; DIAGNOSTIC, W/WO COLLECTION SPECIMEN BY BRUSH OR WASH;  Surgeon: Gwen Pounds, MD;  Location: [facility]URES [facility];  Service: Gastroenterology    PR COLONOSCOPY FLX DX W/COLLJ SPEC WHEN PFRMD N/A 08/09/2016    Procedure: COLONOSCOPY, FLEXIBLE, PROXIMAL TO SPLENIC FLEXURE; DIAGNOSTIC, W/WO COLLECTION SPECIMEN BY BRUSH OR WASH;  Surgeon: Janyth Pupa, MD;  Location: [facility]URES [facility];  Service: Gastroenterology    PR COLONOSCOPY FLX DX W/COLLJ SPEC WHEN PFRMD N/A 04/06/2018    Procedure: COLONOSCOPY, FLEXIBLE, PROXIMAL TO SPLENIC FLEXURE; DIAGNOSTIC, W/WO COLLECTION SPECIMEN BY BRUSH OR WASH;  Surgeon: Zetta Bills, MD;  [facility]: [facility];  Service: Gastroenterology    PR COLONOSCOPY FLX DX W/COLLJ SPEC WHEN PFRMD N/A 10/18/2021    Procedure: COLONOSCOPY, FLEXIBLE, PROXIMAL TO SPLENIC FLEXURE; DIAGNOSTIC, W/WO COLLECTION SPECIMEN BY BRUSH OR WASH;  Surgeon: Hunt Oris, MD;  Location: [facility]URES [facility];  Service: Gastroenterology    PR COLONOSCOPY W/BIOPSY SINGLE/MULTIPLE  07/23/2012    Procedure: COLONOSCOPY, FLEXIBLE, PROXIMAL TO SPLENIC FLEXURE; WITH BIOPSY, SINGLE OR MULTIPLE;  Surgeon: Vickii Chafe, MD;  Location: [facility]URES [facility];  Service: Gastroenterology    PR COLONOSCOPY W/BIOPSY SINGLE/MULTIPLE N/A 07/15/2014    Procedure: COLONOSCOPY, FLEXIBLE, PROXIMAL TO SPLENIC FLEXURE; WITH BIOPSY, SINGLE OR MULTIPLE;  Surgeon: Janyth Pupa, MD;  Location: [facility]URES [facility];  Service: Gastroenterology    PR COLONOSCOPY W/BIOPSY SINGLE/MULTIPLE N/A 03/21/2017    Procedure: COLONOSCOPY, FLEXIBLE, PROXIMAL TO SPLENIC FLEXURE; WITH BIOPSY, SINGLE OR MULTIPLE;  Surgeon: Modena Nunnery, MD;  Location: [facility];  Service: Gastroenterology    PR COLONOSCOPY W/BIOPSY SINGLE/MULTIPLE N/A 02/12/2019    Procedure: COLONOSCOPY, FLEXIBLE, PROXIMAL TO SPLENIC FLEXURE; WITH BIOPSY, SINGLE OR MULTIPLE;  Surgeon: Jules Husbands, MD;  Location: GI [facility]URES [facility];  Service: Gastroenterology    PR COLONOSCOPY W/BIOPSY SINGLE/MULTIPLE Left 05/15/2020    Procedure: COLONOSCOPY, FLEXIBLE, PROXIMAL TO SPLENIC FLEXURE; WITH BIOPSY, SINGLE OR MULTIPLE;  Surgeon: Rona Ravens, MD;  Location[facility];  Service: Gastroenterology    PR COLSC FLEXIBLE W/TRANSENDOSCOPIC BALLOON DILAT N/A 07/15/2014    Procedure: COLONOSCOPY, FLEXIBLE; WITH DILATION BY BALLOON, 1 OR MORE STRICTURES;  Surgeon: Janyth Pupa, MD;  Location: [facility]URES [facility];  Service: Gastroenterology    PR REMVL COLON & TERM ILEUM W/ILEOCOLOSTOMY N/A 09/16/2016    Procedure: COLECTOMY, PARTIAL, WITH REMOVAL OF TERMINAL ILEUM WITH ILEOCOLOSTOMY;  Surgeon: Lady Gary, MD;  Location: MAIN OR [facility];  Service: Gastrointestinal    PR REPAIR BICEPS LONG TENDON Right 03/19/2019    Procedure: TENODESIS LONG TENDON BICEPS;  Surgeon: Gonzella Lex, MD;  Location: ASC OR [facility];  Service: Orthopedics    PR SHLDR ARTHROSCOP,PART ACROMIOPLAS Right 10/02/2018    Procedure: R16 ARTHROSCOPY, SHOULDER, SURGICAL; DECOMPRESS SUBACROMIAL SPACE W/PART ACROMIOPLASTY, Tamala Bari;  Surgeon: Gonzella Lex, MD;  Location: ASC OR [facility];  Service: Orthopedics    PR SHLDR ARTHROSCOP,SURG,W/ROTAT CUFF REPR Right 10/02/2018    Procedure: ARTHROSCOPY, SHOULDER, SURGICAL; WITH  ROTATOR CUFF REPAIR;  Surgeon: Gonzella Lex, MD;  Location: ASC OR [facility];  Service: Orthopedics    PR SHLDR ARTHROSCOP,SURG,W/ROTAT CUFF REPR Right 03/19/2019    Procedure: R16 ARTHROSCOPY, SHOULDER, SURGICAL; WITH ROTATOR CUFF REPAIR;  Surgeon: Gonzella Lex, MD;  Location: ASC OR [facility];  Service: Orthopedics    PR UPPER GI ENDOSCOPY,BIOPSY N/A 07/23/2012    Procedure: UGI ENDOSCOPY; WITH BIOPSY, SINGLE OR MULTIPLE;  Surgeon: Vickii Chafe, MD;  [facility]: [facility];  Service: Gastroenterology    PR UPPER GI ENDOSCOPY,DIAGNOSIS N/A 11/10/2012    Procedure: UGI ENDO, INCLUDE ESOPHAGUS, STOMACH, & DUODENUM &/OR JEJUNUM; DX W/WO COLLECTION SPECIMN, BY BRUSH OR WASH;  Surgeon: Malcolm Metro, MD;  Location: [facility]URES [facility];  Service: Gastroenterology    PR UPPER GI ENDOSCOPY,DIAGNOSIS N/A 07/21/2013    Procedure: UGI ENDO, INCLUDE ESOPHAGUS, STOMACH, & DUODENUM &/OR JEJUNUM; DX W/WO COLLECTION SPECIMN, BY BRUSH OR WASH;  Surgeon: Gwen Pounds, MD;  Location: [facility]URES [facility];  Service: Gastroenterology    PR UPPER GI ENDOSCOPY,DIAGNOSIS N/A 07/15/2014    Procedure: UGI ENDO, INCLUDE ESOPHAGUS, STOMACH, & DUODENUM &/OR JEJUNUM; DX W/WO COLLECTION SPECIMN, BY BRUSH OR WASH;  Surgeon: Janyth Pupa, MD;  Location: [facility]URES [facility];  Service: Gastroenterology    PR UPPER GI ENDOSCOPY,DIAGNOSIS N/A 10/18/2021    Procedure: UGI ENDO, INCLUDE ESOPHAGUS, STOMACH, & DUODENUM &/OR JEJUNUM; DX W/WO COLLECTION SPECIMN, BY BRUSH OR WASH;  Surgeon: Hunt Oris, MD;  Location: [facility]URES [facility];  Service: Gastroenterology    ROTATOR CUFF REPAIR Right 2020    SHOULDER SURGERY      SPINE SURGERY         Social history:  Social History     Social History Narrative    Information taken:  03/31/19        Born [place], [place]        Raised with grandmother        2 siblings- pt is the youngest    Brother lives in [place], [place]    Sister lives in [place], [place]        Married for 37yrs to Rodney Bender        No children         Education - completed through 11th grade I got kicked out at 12th grade        Not working, on disability benefits- Crohn's disease       Family history:  family history includes Cancer in his father and maternal aunt; Hyperlipidemia in his father; No Known Problems in his brother, maternal grandfather, maternal grandmother, maternal uncle, paternal aunt, paternal grandfather, paternal grandmother, paternal uncle, and sister; Stroke in his mother.    MEDICATIONS:   Current Outpatient Medications   Medication Sig Dispense Refill    busPIRone (BUSPAR) 10 MG tablet Take 2 tablets (20 mg total) by mouth Three (3) times a day. 180 tablet 0    citalopram (CELEXA) 10 MG tablet Take 10 mg by mouth daily as needed.      gabapentin (NEURONTIN) 300 MG capsule Take 2 capsules (600 mg total) by mouth Three (3) times a day. 180 capsule 11 loperamide (IMODIUM A-D) 2 mg tablet Take 1 tablet (2 mg total) by mouth daily as needed. 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      pantoprazole (PROTONIX) 40 MG tablet Take 1 tablet (40 mg total) by mouth daily as needed. 90 tablet 3    promethazine (PHENERGAN) 25 MG tablet Take 2 tablets (50 mg total) by mouth every six (6) hours as needed for nausea. 45 tablet 11    sildenafiL (VIAGRA) 100 MG tablet Take 1 tablet (100 mg total) by mouth daily as needed for erectile dysfunction. 90 tablet 3    simethicone (MYLICON) 125 MG chewable tablet Take 2 tablets (250 mg) at 5:00 pm the day before your procedure. Take remaining 2 tablets (250 mg) at least 4 hours before your procedure time. (Patient not taking: Reported on 10/18/2021) 4 tablet 0    therapeutic multivitamin (THERAGRAN) tablet Take 1 tablet by mouth daily. (Patient not taking: Reported on 07/30/2021)      ustekinumab (STELARA) 90 mg/mL Syrg syringe Inject the contents of 1 syringe (90 mg total) under the skin every 8 weeks. 1 mL 5     No current facility-administered medications for this visit.       Allergies:  Allergies   Allergen Reactions    Tramadol Itching and Anxiety     Other reaction(s): Other (See Comments)    Penicillins      Other reaction(s): Itching-Allergy    Dilaudid [Hydromorphone] Anxiety    Morphine Itching       Review of Systems:  Positive for anxiety and difficulty having and maintaining an erection.  Otherwise, a 10 system ROS questionnaire completed by the patient and reviewed by me was normal.    Physical Exam  Vitals:    11/12/21 0914   BP: 128/81   Pulse: 68   Weight: 65.3 kg (144 lb)     GENERAL: The patient is a male in no acute distress.   HEENT: Normocephalic and atraumatic.   NECK: Supple with trachea midline.   LYMPHATICS: No cervical or supraclavicular lymphadenopathy.   PULMONARY: Relaxed respiratory effort on room air.   CARDIOVASCULAR: Regular rate  GASTROINTESTINAL: Soft, nontender and nondistended.   GENITOURINARY: Declined  SKIN: No signs of cyanosis or clubbing.   NEUROLOGICAL: Grossly intact.   PSYCH: Alert and oriented x 3. Flat affect.

## 2021-11-12 NOTE — Unmapped (Addendum)
-    viagra 100 mg You may take this 30 minutes to four hours prior to intercourse to help with erections. This medication is best on an empty stomach. You may not takes nitrates for chest pain with this medication. Go to the ED if you have an erection lasting more than four hours. ([email or web address] to create an account)  - hormone profile today (hematocrit, e2, prolactin, lh, fsh, t, psa)

## 2021-11-13 DIAGNOSIS — R6882 Decreased libido: Principal | ICD-10-CM

## 2021-11-13 DIAGNOSIS — N528 Other male erectile dysfunction: Principal | ICD-10-CM

## 2021-11-15 MED ORDER — BUSPIRONE 10 MG TABLET
ORAL_TABLET | 0 refills | 0 days
Start: 2021-11-15 — End: ?

## 2021-11-15 MED FILL — STELARA 90 MG/ML SUBCUTANEOUS SYRINGE: SUBCUTANEOUS | 56 days supply | Qty: 1 | Fill #4

## 2021-11-20 ENCOUNTER — Ambulatory Visit: Admit: 2021-11-20 | Discharge: 2021-11-21 | Payer: MEDICAID

## 2021-11-20 LAB — TESTOSTERONE: TESTOSTERONE TOTAL: 151 ng/dL — ABNORMAL LOW

## 2021-11-20 MED ORDER — BUSPIRONE 10 MG TABLET
ORAL_TABLET | 0 refills | 0 days | Status: CP
Start: 2021-11-20 — End: ?

## 2021-11-21 DIAGNOSIS — E291 Testicular hypofunction: Principal | ICD-10-CM

## 2021-11-21 DIAGNOSIS — N528 Other male erectile dysfunction: Principal | ICD-10-CM

## 2021-11-21 NOTE — Unmapped (Signed)
I contacted the patient regarding results of his testosterone levels. He would like to go on testosterone replacement. Discussed benefits and risks. He would like to try injections.       Schedulers: Please set him up for injection teaching with nursing. He will do then do self IM injections weekly. He'll need a three month follow up with me after this injection teaching. He should repeat a testosterone level in the AM so we can get insurance coverage (prior to 10 am)     Verlon Au       Counseling:    We discussed that options include observation with implementation of dietary/exercise strategies versus testosterone replacement (with gel, injections, pellets, etc) and alternative therapies such as Clomid and/or Arimidex to increase natural production. We also discussed that losing weight is a means of naturally improving testosterone levels. Studies suggest dietary modifications including drinking less alcohol, and eating a well-balanced diet with lots of vegetables can also help.  Also stress management and improved sleep habits were discussed.  I suggested a trial of 6 months of diet/exercise strategies prior to medical therapy.    We reviewed risks of TRT including but not limited to acne, fluid retention, BPH or urinary symptoms, breast enlargement, worsening of sleep apnea, infertility, changes in blood count requiring phlebotomy, changes in cholesterol requiring additional medical therapy, blood clots including deep venous thrombosis or thromboembolic events, and prostate cancer progression.  We also discussed recent literature suggesting an increase in cardiovascular events (including heart attack/stroke) and mortality, but we also reviewed that there is conflicting literature in this regard.  He understands these risks and wishes to proceed.  We also reviewed the AUA's Position Statement on testosterone therapy with him and the new FDA warning, and these statements were provided in writing.    We discussed that he will need to be followed at regular intervals for physical exam (including digital rectal exam), reassessment of symptoms, and lab work including PSA, hormone levels, and complete blood counts.  After being fully advised of these risks and expressing understanding, he would like to pursue therapy.  He was given a handout delineating the importance of regular follow-up and risks of testosterone replacement therapy, including but not limited to those noted above.             tains abnormal data Testosterone  Order: [id number]  Status: Final result       Visible to patient: Yes (seen)       Dx: Low libido; Other male erectile dysfu...    0 Result Notes       Component  Ref Range & Units 1 d ago 9 d ago   Testosterone  197 - 670 ng/dL 782 Low  956   Resulting Agency [facility]              Specimen Collected: 11/20/21 14:15

## 2021-11-21 NOTE — Unmapped (Signed)
Doesn't want to do the injections is waiting to do the gel ones instead. Calling you right back about the phone call today.     Thanks   Delice Bison

## 2021-11-22 NOTE — Unmapped (Signed)
Hi Rodney Bender,    I think you received a message stated he would like to have the testosterone gel instead? Is this correct?      Thanks  [employer]

## 2021-11-23 ENCOUNTER — Ambulatory Visit: Admit: 2021-11-23 | Discharge: 2021-11-23 | Payer: MEDICAID

## 2021-11-23 DIAGNOSIS — K50811 Crohn's disease of both small and large intestine with rectal bleeding: Principal | ICD-10-CM

## 2021-11-23 DIAGNOSIS — K50018 Crohn's disease of small intestine with other complication: Principal | ICD-10-CM

## 2021-11-23 DIAGNOSIS — K50012 Crohn's disease of small intestine with intestinal obstruction: Principal | ICD-10-CM

## 2021-11-23 DIAGNOSIS — E291 Testicular hypofunction: Principal | ICD-10-CM

## 2021-11-23 LAB — TESTOSTERONE: TESTOSTERONE TOTAL: 249 ng/dL

## 2021-11-23 NOTE — Unmapped (Signed)
Next steps to execute our plan:  Continue the Stelara every 8 weeks.  Reach out to me if you have any questions or concerns.  I'll see you back in clinic in 6 months.  -------------------------------------------------------------------------------  Phone numbers you may need:    For follow-up appointments call the GI clinic schedulers at [phone].   For urgent follow-up appointments ask me on MyChart or ask my nurse, Alyse Low, at [phone].    For scheduling GI procedures (Upper endoscopy, colonoscopy, esophageal manometry, pH/impedence test) call the the GI procedures schedulers at [phone].  For scheduling radiology exams (Barium tests, CT scans, MRIs) call Radiology at [phone].  For scheduling exams or procedures call interventional radiology (VIR) at [phone].  For scheduling lab draws (blood, stool, urine) call [place] lab at [phone].    For non-urgent clinical questions, use MyChart, or call my nurse, Alyse Low, at [phone].  For urgent clinical questions call my nurse, Alyse Low, at [phone].  For urgent questions after hours you can call [phone] and ask for the GI medicine fellow on call.  For emergencies call 911 and present to the emergency department.    ==============================================

## 2021-11-23 NOTE — Unmapped (Signed)
[facility] at [facility]  Faculty Established Follow-up Visit Note    REFERRING PROVIDER: Hunt Oris, MD  [address]  [address]  [place],  [place] [address]    PRIMARY CARE PROVIDER: Marda Stalker, MD    Assessment and Plan:  Rodney Bender is a 51 y.o. 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    Crohn's disease of small intestine with other complication  Historically he has had very high risk disease with an ileocecectomy and multiple revisions of the anastomosis losing a reasonable amount of small bowel though substantially less than 50%. Non-adherence before he got married was almost certainly a factor in this for him. He struggles to maintain his weight and we have tried multiple appetite stimulants for some time, holding our own but not gaining significant weight either. He reports he is adherent to his Stelara and has had relatively recent imaging and EGD/colonoscopy.  - Continue stelara every 8 weeks    Functional dyspepsia: He has chronic functional dyspepsia symptoms that do not seem to correlate with his IBD disease activity. Given he is having some chronic nausea today, Zyprexa might be helpful for appetite and weight gain as well as his nausea is reasonably safe.  We have tried centrally acting agents in the past but he gets better relief from gabapentin.  He is additionally having some reflux symptoms and is possible that GERD can contribute to his dyspepsia as well. He remains on pantoprazole which has been useful for his heartburn symptoms, but does not dramatically help with his dyspepsia.  - Continue pantoprazole 40 mg once daily 30 minutes before meal  - Continue gabapentin to 600 mg 3 times daily  - Continue Marinol.    Patient Instructions   Next steps to execute our plan:  1. Continue the Stelara every 8 weeks.  2. Reach out to me if you have any questions or concerns.  3. I'll see you back in clinic in 6 months.  -------------------------------------------------------------------------------  Phone numbers you may need:    For follow-up appointments call the GI clinic schedulers at [phone].   For urgent follow-up appointments ask me on MyChart or ask my nurse, Alyse Low, at [phone].    For scheduling GI procedures (Upper endoscopy, colonoscopy, esophageal manometry, pH/impedence test) call the the GI procedures schedulers at [phone].  For scheduling radiology exams (Barium tests, CT scans, MRIs) call Radiology at [phone].  For scheduling exams or procedures call interventional radiology (VIR) at [phone].  For scheduling lab draws (blood, stool, urine) call [facility] at [phone].    For non-urgent clinical questions, use MyChart, or call my nurse, Alyse Low, at [phone].  For urgent clinical questions call my nurse, Alyse Low, at [phone].  For urgent questions after hours you can call [phone] and ask for the GI medicine fellow on call.  For emergencies call 911 and present to the emergency department.    ==============================================      Return in about 6 months (around 05/25/2022).    I personally spent 10 minutes face-to-face and non-face-to-face in the care of this patient, which includes all pre, intra, and post visit time on the date of service.  All documented time was specific to the E/M visit and does not include any procedures that may have been performed.     Lamerle Jabs C. Elizebeth Brooking, MD MPH  Clinical Assistant Professor  Gastroenterology and Hepatology     -------------------------    Chief Complaint    Follow-up       History of Present Illness:      Rodney Bender is a 51 y.o. 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    Last seen 07/16/21. At that time, was not doing well due to 4-5 BM daily and difficulty maintaining weight after Marinol. Colonoscopy 10/18/21 showed CD in remission with SES-CD 1. EGD unremarkable. He continues to feel about the same. He's having 2-3 BM daily that are fairly loose. He has persistent epigastric pain which is unchanged from prior. He is taking pantoprazole which has resolved his heartburn, but does not do much for his abdominal pain. He is taking Marinol to help stimulate his appetite. His weight is stable.    Past Medical History:   Diagnosis Date   ??? Acid reflux    ??? Anxiety    ??? Crohn's disease (CMS-HCC)     diagnosed in 1990   ??? GERD (gastroesophageal reflux disease)    ??? Hypertension 10/08/2016     Past Surgical History:  ??? Ileocolectomy with anastomosis and multiple IR drains about 5 cm ileum and colon (09/16/2016)  ??? Ileocolectomy about 15 cm (2009)  ??? Ileocecectomy unclear extent (1998)     Current Outpatient Medications on File Prior to Visit   Medication Sig Dispense Refill   ??? busPIRone (BUSPAR) 10 MG tablet TAKE 2 TABLETS BY MOUTH THREE TIMES DAILY 180 tablet 0   ??? gabapentin (NEURONTIN) 300 MG capsule Take 2 capsules (600 mg total) by mouth Three (3) times a day. 180 capsule 11   ??? loperamide (IMODIUM A-D) 2 mg tablet Take 1 tablet (2 mg total) by mouth daily as needed.     ??? naloxone (NARCAN) 4 mg nasal spray      ??? OLANZapine (ZYPREXA) 2.5 MG tablet Take 1 tablet by mouth nightly 30 tablet 5   ??? oxyCODONE-acetaminophen (PERCOCET) 10-325 mg per tablet Take 1 tablet by mouth every eight (8) hours as needed.     ??? pantoprazole (PROTONIX) 40 MG tablet Take 1 tablet (40 mg total) by mouth daily as needed. 90 tablet 3   ??? promethazine (PHENERGAN) 25 MG tablet Take 2 tablets (50 mg total) by mouth every six (6) hours as needed for nausea. 45 tablet 11   ??? sildenafiL (VIAGRA) 100 MG tablet Take 1 tablet (100 mg total) by mouth daily as needed for erectile dysfunction. 90 tablet 3   ??? therapeutic multivitamin (THERAGRAN) tablet Take 1 tablet by mouth daily.     ??? ustekinumab (STELARA) 90 mg/mL Syrg syringe Inject the contents of 1 syringe (90 mg total) under the skin every 8 weeks. 1 mL 5   ??? citalopram (CELEXA) 10 MG tablet Take 10 mg by mouth daily as needed.     ??? [EXPIRED] droNABinol (MARINOL) 10 MG capsule Take 1 capsule (10 mg total) by mouth Two (2) times a day (30 minutes before a meal). 60 capsule 0   ??? simethicone (MYLICON) 125 MG chewable tablet Take 2 tablets (250 mg) at 5:00 pm the day before your procedure. Take remaining 2 tablets (250 mg) at least 4 hours before your procedure time. (Patient not taking: Reported on 10/18/2021) 4 tablet 0     Current Facility-Administered Medications on File Prior to Visit   Medication Dose Route  Frequency Provider Last Rate Last Admin   ??? testosterone cypionate (DEPOTESTOTERONE CYPIONATE) injection 100 mg  100 mg Intramuscular Once Sylvan Cheese, AGNP         Allergies  Reviewed on 11/23/2021        Reactions Comments    Tramadol Itching, Anxiety Other reaction(s): Other (See Comments)    Penicillins  Other reaction(s): Itching-Allergy    Dilaudid [hydromorphone] Anxiety     Morphine Itching             Family History   Problem Relation Age of Onset   ??? Hyperlipidemia Father    ??? Cancer Father         kidney   ??? Cancer Maternal Aunt    ??? Stroke Mother    ??? No Known Problems Sister    ??? No Known Problems Brother    ??? No Known Problems Maternal Uncle    ??? No Known Problems Paternal Aunt    ??? No Known Problems Paternal Uncle    ??? No Known Problems Maternal Grandmother    ??? No Known Problems Maternal Grandfather    ??? No Known Problems Paternal Grandmother    ??? No Known Problems Paternal Grandfather    ??? Anesthesia problems Neg Hx    ??? Broken bones Neg Hx    ??? Clotting disorder Neg Hx    ??? Collagen disease Neg Hx    ??? Diabetes Neg Hx    ??? Dislocations Neg Hx    ??? Fibromyalgia Neg Hx    ??? Gout Neg Hx    ??? Hemophilia Neg Hx    ??? Osteoporosis Neg Hx    ??? Rheumatologic disease Neg Hx    ??? Scoliosis Neg Hx    ??? Severe sprains Neg Hx    ??? Sickle cell anemia Neg Hx    ??? Spinal Compression Fracture Neg Hx      Social History     Tobacco Use   ??? Smoking status: Former     Packs/day: 1.00     Years: 18.00     Additional pack years: 0.00     Total pack years: 18.00     Types: Cigarettes     Start date: 08/27/2003     Quit date: 06/06/2017     Years since quitting: 4.4   ??? Smokeless tobacco: Never   ??? Tobacco comments:     Pt smokes 1ppd, Pt is interested in tobacco cessation    Vaping Use   ??? Vaping Use: Never used   Substance Use Topics   ??? Alcohol use: Not Currently   ??? Drug use: Not Currently     Types: Marijuana     Review of Systems:  The balance of 12 systems reviewed is negative except as noted in the history of present illness.    Vital Signs: BP 135/83 (BP Site: L Arm, BP Position: Sitting, BP Cuff Size: Medium)  - Pulse 68  - Temp 36.3 ??C (97.3 ??F) (Temporal)  - Ht 180.3 cm (5' 11)  - Wt 64.5 kg (142 lb 3.2 oz)  - BMI 19.83 kg/m??     Wt Readings from Last 6 Encounters:   11/23/21 64.5 kg (142 lb 3.2 oz)   11/12/21 65.3 kg (144 lb)   10/18/21 65.8 kg (145 lb)   07/30/21 64.2 kg (141 lb 9.6 oz)   07/16/21 61.2 kg (135 lb)   03/16/21 62.4 kg (137 lb 9.6 oz)     Review  of Systems:  General appearance: Appears well, no distress and cachectic.  Eyes: Anicteric sclera. No erythema.  ENT: No oral ulcers. MMM  Cardiovascular: RRR, WWP. No lower extremity edema.  Pulmonary: Normal work of breathing. Acyanotic.  Abdominal: soft, tender epigastrium, nondistended, no masses or organomegaly.  Musculoskeletal: No temporal wasting. Normal joints of the hand.  Skin: No jaundice. No rashes.  Neurologic: Alert, oriented, and appropriate.  Psychiatric: Appropriate.    Data Review:  Review of labs found:  10/24/21 - Hgb 11.6, nl LFTs and Cr, ferritin 87    Review of endoscopy found:  ?? Colonoscopy - 10/18/21 - i0 at anastomosis, SES-CD 1  ?? Upper endoscopy - 10/18/21 - nl  ?? Colonoscopy - 05/15/2020 - i1 at anastomosis, SESCD 3, one 2 mm polyp  ?? Colonoscopy - 02/12/2019 - i1 at anastomosis, no more proximal ileal inflammation  ?? Colonoscopy - 04/06/2018 - i0 at anastomosis  ?? Colonoscopy - 03/21/2017 - i1 at anastomosis, one 2mm polup  ?? Colonoscopy - 08/09/2016 - occlusive ulcerated stricture at anastomosis   ?? Upper endoscopy - 07/15/2014 - nl  ?? Colonoscopy - 07/15/2014 - inflammed anastomosis dilated to 12-3mm, scattered apthae distal ileum  ?? Colonoscopy - 07/21/2013 - i0 anastomosis, nl TI, hemorrhoids  ?? Upper endoscopy - 07/21/2013 - nl  ?? Upper endoscopy - 11/10/2012 - nl  ?? Colonoscopy - 11/10/2012 - traversable anastomosis with i4 inflammation, and ulcerations in the TI  ?? Note procedures on record back to 06/12/1998    Review of imaging found:  ?? CT abdomen/pelvis w contrast - multiple air fluid levels in colon, nonspecific but c/w diarrheal illness  ?? CT enterography - 04/07/2020 - scattered small bowel narrowing without adjacent inflammatory stranding  ?? CT abdomen/pelvis w contrast - 02/08/2019 - Diffuse small bowel wall thickening is seen with surrounding free fluid suggestive of active inflammation  ?? CT abdomen/pelvis w contrast - 12/20/2017 - Mucosal thickening and enhancement in the left hemiabdomen small bowel with associated engorged mesenteric vessels. Mucosal wall thickening in the sigmoid colon and rectum  ?? CT abdomen/pelvis w IV and oral contrast - 03/12/2017 - Status post ileocecectomy, unremarkable  ?? Note exams on record back to 06/16/1998  .  Review of other studies found:  None

## 2021-11-27 MED ORDER — TESTOSTERONE 20.25 MG/1.25 GRAM PER PUMP ACT.(1.62 %) TRANSDERMAL GEL
Freq: Every day | TRANSDERMAL | 5 refills | 0 days | Status: CP
Start: 2021-11-27 — End: ?

## 2021-12-04 MED ORDER — PROMETHAZINE 25 MG TABLET
ORAL_TABLET | Freq: Four times a day (QID) | ORAL | 11 refills | 6 days | PRN
Start: 2021-12-04 — End: ?

## 2021-12-05 MED ORDER — PROMETHAZINE 25 MG TABLET
ORAL_TABLET | Freq: Four times a day (QID) | ORAL | 11 refills | 6 days | Status: CP | PRN
Start: 2021-12-05 — End: ?

## 2021-12-05 NOTE — Unmapped (Signed)
Prior authorization sent to cover my meds for Testosterone Gel 1.62%.    Rosalyn Gess, CMAII

## 2021-12-17 MED ORDER — DRONABINOL 10 MG CAPSULE
ORAL_CAPSULE | 0 refills | 0 days | Status: CP
Start: 2021-12-17 — End: ?

## 2021-12-17 MED ORDER — TESTOSTERONE 20.25 MG/1.25 GRAM PER PUMP ACT.(1.62 %) TRANSDERMAL GEL
Freq: Every day | TRANSDERMAL | 5 refills | 0 days
Start: 2021-12-17 — End: ?

## 2021-12-17 NOTE — Unmapped (Signed)
error 

## 2021-12-18 MED ORDER — TESTOSTERONE 20.25 MG/1.25 GRAM PER PUMP ACT.(1.62 %) TRANSDERMAL GEL
Freq: Every day | TRANSDERMAL | 5 refills | 0 days | Status: CP
Start: 2021-12-18 — End: ?

## 2021-12-26 ENCOUNTER — Encounter: Payer: Self-pay | Admitting: Anesthesiology

## 2021-12-26 ENCOUNTER — Ambulatory Visit: Payer: Medicare Other | Attending: Anesthesiology | Admitting: Anesthesiology

## 2021-12-26 DIAGNOSIS — G894 Chronic pain syndrome: Secondary | ICD-10-CM

## 2021-12-26 DIAGNOSIS — G8929 Other chronic pain: Secondary | ICD-10-CM

## 2021-12-26 DIAGNOSIS — R109 Unspecified abdominal pain: Secondary | ICD-10-CM

## 2021-12-26 DIAGNOSIS — F1190 Opioid use, unspecified, uncomplicated: Secondary | ICD-10-CM | POA: Diagnosis not present

## 2021-12-26 DIAGNOSIS — M542 Cervicalgia: Secondary | ICD-10-CM

## 2021-12-26 DIAGNOSIS — M25511 Pain in right shoulder: Secondary | ICD-10-CM

## 2021-12-26 DIAGNOSIS — M5412 Radiculopathy, cervical region: Secondary | ICD-10-CM

## 2021-12-26 DIAGNOSIS — M47816 Spondylosis without myelopathy or radiculopathy, lumbar region: Secondary | ICD-10-CM

## 2021-12-26 DIAGNOSIS — K5090 Crohn's disease, unspecified, without complications: Secondary | ICD-10-CM | POA: Diagnosis not present

## 2021-12-26 DIAGNOSIS — R200 Anesthesia of skin: Secondary | ICD-10-CM

## 2021-12-26 MED ORDER — OXYCODONE-ACETAMINOPHEN 10-325 MG PO TABS: 1.0000 | ORAL_TABLET | Freq: Three times a day (TID) | ORAL | 0 refills | Status: DC | PRN

## 2021-12-26 MED ORDER — OXYCODONE-ACETAMINOPHEN 10-325 MG PO TABS: 1.0000 | ORAL_TABLET | Freq: Three times a day (TID) | ORAL | 0 refills | Status: AC | PRN

## 2021-12-28 NOTE — Progress Notes (Signed)
Virtual Visit via Telephone Note  I connected with Benjamin Murillo on 12/28/21 at 11:20 AM EST by telephone and verified that I am speaking with the correct person using two identifiers.  Location: Patient: Home Provider: Pain control center   I discussed the limitations, risks, security and privacy concerns of performing an evaluation and management service by telephone and the availability of in person appointments. I also discussed with the patient that there may be a patient responsible charge related to this service. The patient expressed understanding and agreed to proceed.   History of Present Illness: I spoke with Benjamin Murillo via telephone today we are unable like for the video portion of the conference.  He reports that his abdominal pain low back pain and intermittent neck pain are stable in nature without recent change.  He continues to follow-up with his gastroenterologist regarding his chronic abdominal pain.  He is taking his medications as prescribed and these continue to keep his pain under good control.  Unfortunately he has failed more conservative therapy but this current regimen seems to work very well for him with no side effects reported.  He gets approximately 75% improvement in his diffuse body pain lasting about 5 to 6 hours before he has recurrence of his baseline pain.  He sleeps better with this regimen additionally.  He is trying to stay active and is doing some core stretching strengthening exercises effectively.  No other changes are reported today.  Review of systems: General: No fevers or chills Pulmonary: No shortness of breath or dyspnea Cardiac: No angina or palpitations or lightheadedness GI: No abdominal pain or constipation Psych: No depression    Observations/Objective:  Current Outpatient Medications:    [START ON 01/29/2022] oxyCODONE-acetaminophen (PERCOCET) 10-325 MG tablet, Take 1 tablet by mouth every 8 (eight) hours as needed for pain., Disp:  75 tablet, Rfl: 0   amlodipine-atorvastatin (CADUET) 10-10 MG tablet, Take 1 tablet by mouth daily., Disp: , Rfl:    citalopram (CELEXA) 10 MG tablet, Take 10 mg by mouth daily as needed., Disp: , Rfl:    dronabinol (MARINOL) 10 MG capsule, Take 10 mg by mouth 2 (two) times daily before a meal., Disp: , Rfl:    erythromycin ophthalmic ointment, Place 1 Application into the left eye at bedtime., Disp: 3.5 g, Rfl: 0   famotidine (PEPCID) 20 MG tablet, Take 20 mg by mouth 2 (two) times daily., Disp: , Rfl:    gabapentin (NEURONTIN) 100 MG capsule, Take 1 capsule (100 mg total) by mouth at bedtime., Disp: 30 capsule, Rfl: 3   gabapentin (NEURONTIN) 300 MG capsule, Take 1 capsule (300 mg total) by mouth 3 (three) times daily., Disp: 90 capsule, Rfl: 3   hyoscyamine (LEVSIN, ANASPAZ) 0.125 MG tablet, Take 0.125 mg by mouth as needed. , Disp: , Rfl:    loperamide (IMODIUM A-D) 2 MG tablet, Take 2 mg by mouth as needed., Disp: , Rfl:    naloxone (NARCAN) nasal spray 4 mg/0.1 mL, For respiratory depression from opioids, Disp: 1 kit, Rfl: 2   [START ON 12/29/2021] oxyCODONE-acetaminophen (PERCOCET) 10-325 MG tablet, Take 1 tablet by mouth every 8 (eight) hours as needed for pain., Disp: 75 tablet, Rfl: 0   predniSONE (STERAPRED UNI-PAK 21 TAB) 10 MG (21) TBPK tablet, 6 tablets day one and day two, five tablets day three and day four, and daily taper over 12days as directed, Disp: 42 tablet, Rfl: 0   prochlorperazine (COMPAZINE) 5 MG tablet, Take 5 mg by  mouth every 8 (eight) hours as needed., Disp: , Rfl:    STELARA 90 MG/ML SOSY injection, Inject 90 mg as directed., Disp: , Rfl:    Past Medical History:  Diagnosis Date   Cervical radiculitis 12/22/2019   Cervical radiculopathy    Chronic abdominal pain    Chronic neck pain    Cold    Crohn disease (HCC)    GERD (gastroesophageal reflux disease)    no meds-2-3 times weekly   Hand fracture, left    Headache(784.0)    Hypertension    no meds    IBS  (irritable bowel syndrome)    Shortness of breath    "sometimes laying down; sometimes w/activity" (10/13/2012)     Assessment and Plan: 1. Cervicalgia   2. Crohn's disease without complication, unspecified gastrointestinal tract location ([place])   3. Chronic pain syndrome   4. Chronic, continuous use of opioids   5. Chronic abdominal pain   6. Facet arthritis of lumbar region   7. Chronic neck and back pain   8. Cervical radiculitis   9. Right arm numbness   10. Acute pain of right shoulder   Based on our conversation today and after review of the [facility] practitioner database information I think is appropriate to refill his medicines for the next 2 months.  These be dated for December 3 and January 2 with a schedule return to clinic in 2 months.  No other changes in his pharmacologic regimen are initiated today.  We have talked about ongoing exercises to help with core strength.  Continue follow-up with primary care physicians and GI for his baseline medical care are requested with return to clinic in 2 months.  Follow Up Instructions:    I discussed the assessment and treatment plan with the patient. The patient was provided an opportunity to ask questions and all were answered. The patient agreed with the plan and demonstrated an understanding of the instructions.   The patient was advised to call back or seek an in-person evaluation if the symptoms worsen or if the condition fails to improve as anticipated.  I provided 30 minutes of non-face-to-face time during this encounter.   [name], MD

## 2021-12-31 MED ORDER — DRONABINOL 10 MG CAPSULE
ORAL_CAPSULE | 0 refills | 0 days
Start: 2021-12-31 — End: ?

## 2022-01-01 MED ORDER — DRONABINOL 10 MG CAPSULE
ORAL_CAPSULE | 0 refills | 0 days | Status: CP
Start: 2022-01-01 — End: ?

## 2022-01-01 NOTE — Unmapped (Signed)
[facility] Clinical Assessment & Refill Coordination Note    Rodney Bender, [place]: [birth year]  Phone: [phone] (home)     All above HIPAA information was verified with patient.     Was a Nurse, learning disability used for this call? No    Specialty Medication(s):   Inflammatory Disorders: Stelara     Current Outpatient Medications   Medication Sig Dispense Refill    busPIRone (BUSPAR) 10 MG tablet TAKE 2 TABLETS BY MOUTH THREE TIMES DAILY 180 tablet 0    citalopram (CELEXA) 10 MG tablet Take 10 mg by mouth daily as needed.      dronabinol (MARINOL) 10 MG capsule TAKE 1 CAPSULE BY MOUTH TWICE DAILY 30 MINUTES BEFORE A MEAL 60 capsule 0    gabapentin (NEURONTIN) 300 MG capsule Take 2 capsules (600 mg total) by mouth Three (3) times a day. 180 capsule 11    loperamide (IMODIUM A-D) 2 mg tablet Take 1 tablet (2 mg total) by mouth daily as needed.      naloxone (NARCAN) 4 mg nasal spray       OLANZapine (ZYPREXA) 2.5 MG tablet Take 1 tablet by mouth nightly 30 tablet 5    oxyCODONE-acetaminophen (PERCOCET) 10-325 mg per tablet Take 1 tablet by mouth every eight (8) hours as needed.      pantoprazole (PROTONIX) 40 MG tablet Take 1 tablet (40 mg total) by mouth daily as needed. 90 tablet 3    promethazine (PHENERGAN) 25 MG tablet Take 2 tablets (50 mg total) by mouth every six (6) hours as needed for nausea. 45 tablet 11    sildenafiL (VIAGRA) 100 MG tablet Take 1 tablet (100 mg total) by mouth daily as needed for erectile dysfunction. 90 tablet 3    testosterone 20.25 mg/1.25 gram (1.62 %) gel pump Place 2 sprays (40.5 mg total) on the skin daily. 75 g 5    therapeutic multivitamin (THERAGRAN) tablet Take 1 tablet by mouth daily.      ustekinumab (STELARA) 90 mg/mL Syrg syringe Inject the contents of 1 syringe (90 mg total) under the skin every 8 weeks. 1 mL 5     No current facility-administered medications for this visit.        Changes to medications: Rodney Bender reports no changes at this time.    Allergies   Allergen Reactions    Tramadol Itching and Anxiety     Other reaction(s): Other (See Comments)    Penicillins      Other reaction(s): Itching-Allergy    Dilaudid [Hydromorphone] Anxiety    Morphine Itching       Changes to allergies: No    SPECIALTY MEDICATION ADHERENCE     Stelara 90 mg/ml: 0 days of medicine on hand     Medication Adherence    Patient reported X missed doses in the last month: 0  Specialty Medication: Stelara 90mg /mL - 1q8w  Patient is on additional specialty medications: No  Patient is on more than two specialty medications: No  Any gaps in refill history greater than 2 weeks in the last 3 months: no  Demonstrates understanding of importance of adherence: yes  Informant: patient                            Specialty medication(s) dose(s) confirmed: Regimen is correct and unchanged.     Are there any concerns with adherence? No    Adherence counseling provided? Not needed  CLINICAL MANAGEMENT AND INTERVENTION      Clinical Benefit Assessment:    Do you feel the medicine is effective or helping your condition? Yes    Clinical Benefit counseling provided? Not needed    Adverse Effects Assessment:    Are you experiencing any side effects? No    Are you experiencing difficulty administering your medicine? No    Quality of Life Assessment:    Quality of Life    Rheumatology  Oncology  Dermatology  Cystic Fibrosis          How many days over the past month did your crohn's  keep you from your normal activities? For example, brushing your teeth or getting up in the morning. 0    Have you discussed this with your provider? Not needed    Acute Infection Status:    Acute infections noted within Epic:  No active infections  Patient reported infection: None    Therapy Appropriateness:    Is therapy appropriate and patient progressing towards therapeutic goals? Yes, therapy is appropriate and should be continued    DISEASE/MEDICATION-SPECIFIC INFORMATION      For patients on injectable medications: Patient currently has 0 doses left.  Next injection is scheduled for 12/16.    Chronic Inflammatory Diseases: Have you experienced any flares in the last month? No  Has this been reported to your provider? N/a    PATIENT SPECIFIC NEEDS     Does the patient have any physical, cognitive, or cultural barriers? No    Is the patient high risk? No    Did the patient require a clinical intervention? No    Does the patient require physician intervention or other additional services (i.e., nutrition, smoking cessation, social work)? No    SOCIAL DETERMINANTS OF HEALTH     At the [facility], we have learned that life circumstances - like trouble affording food, housing, utilities, or transportation can affect the health of many of our patients.   That is why we wanted to ask: are you currently experiencing any life circumstances that are negatively impacting your health and/or quality of life? Patient declined to answer    Social Determinants of Health     Financial Resource Strain: Low Risk  (01/19/2020)    Overall Financial Resource Strain (CARDIA)     Difficulty of Paying Living Expenses: Not hard at all   Internet Connectivity: Not on file   Food Insecurity: Food Insecurity Present (04/03/2021)    Hunger Vital Sign     Worried About Running Out of Food in the Last Year: Often true     Ran Out of Food in the Last Year: Often true   Tobacco Use: Medium Risk (11/23/2021)    Patient History     Smoking Tobacco Use: Former     Smokeless Tobacco Use: Never     Passive Exposure: Not on file   Housing/Utilities: High Risk (04/03/2021)    Housing/Utilities     Within the past 12 months, have you ever stayed: outside, in a car, in a tent, in an overnight shelter, or temporarily in someone else's home (i.e. couch-surfing)?: Yes     Are you worried about losing your housing?: No     Within the past 12 months, have you been unable to get utilities (heat, electricity) when it was really needed?: Yes   Alcohol Use: Not At Risk (08/11/2019)    Alcohol Use     How often do you have a drink containing  alcohol?: Never     How many drinks containing alcohol do you have on a typical day when you are drinking?: Not on file     How often do you have 5 or more drinks on one occasion?: Never   Transportation Needs: No Transportation Needs (01/19/2020)    PRAPARE - Transportation     Lack of Transportation (Medical): No     Lack of Transportation (Non-Medical): No   Substance Use: Low Risk  (08/11/2019)    Substance Use     Taken prescription drugs for non-medical reasons: Never     Taken illegal drugs: Never     Patient indicated they have taken drugs in the past year for non-medical reasons: Yes, [positive answer(s)]: Not on file   Health Literacy: Low Risk  (08/13/2020)    Health Literacy     : Never   Physical Activity: Insufficiently Active (12/20/2017)    Exercise Vital Sign     Days of Exercise per Week: 3 days     Minutes of Exercise per Session: 30 min   Interpersonal Safety: Not on file   Stress: Stress Concern Present (06/02/2019)    [employer] of Occupational Health - Occupational Stress Questionnaire     Feeling of Stress : To some extent   Intimate Partner Violence: Not At Risk (06/02/2019)    Humiliation, Afraid, Rape, and Kick questionnaire     Fear of Current or Ex-Partner: No     Emotionally Abused: No     Physically Abused: No     Sexually Abused: No   Depression: Not at risk (07/30/2021)    PHQ-2     PHQ-2 Score: 0   Social Connections: Moderately Isolated (06/02/2019)    Social Connection and Isolation Panel [NHANES]     Frequency of Communication with Friends and Family: More than three times a week     Frequency of Social Gatherings with Friends and Family: Never     Attends Religious Services: Never     Database administrator or Organizations: No     Attends Engineer, structural: Never     Marital Status: Married       Would you be willing to receive help with any of the needs that you have identified today? Not applicable       SHIPPING     Specialty Medication(s) to be Shipped:   Inflammatory Disorders: Stelara    Other medication(s) to be shipped: No additional medications requested for fill at this time     Changes to insurance: No    Delivery Scheduled: Yes, Expected medication delivery date: 12/12.     Medication will be delivered via Same Day Courier to the confirmed prescription address in [facility].    The patient will receive a drug information handout for each medication shipped and additional FDA Medication Guides as required.  Verified that patient has previously received a Conservation officer, historic buildings and a Surveyor, mining.    The patient or caregiver noted above participated in the development of this care plan and knows that they can request review of or adjustments to the care plan at any time.      All of the patient's questions and concerns have been addressed.    Teofilo Pod, PharmD   [facility] Specialty Pharmacist

## 2022-01-02 MED ORDER — DRONABINOL 10 MG CAPSULE
ORAL_CAPSULE | 0 refills | 0 days
Start: 2022-01-02 — End: ?

## 2022-01-04 ENCOUNTER — Ambulatory Visit: Admit: 2022-01-04 | Discharge: 2022-01-04 | Payer: MEDICAID

## 2022-01-04 DIAGNOSIS — K50818 Crohn's disease of both small and large intestine with other complication: Principal | ICD-10-CM

## 2022-01-04 DIAGNOSIS — R6889 Other general symptoms and signs: Principal | ICD-10-CM

## 2022-01-04 DIAGNOSIS — R079 Chest pain, unspecified: Principal | ICD-10-CM

## 2022-01-04 DIAGNOSIS — F419 Anxiety disorder, unspecified: Principal | ICD-10-CM

## 2022-01-04 LAB — LIPID PANEL
CHOLESTEROL/HDL RATIO SCREEN: 3.3 (ref 1.0–4.5)
CHOLESTEROL: 127 mg/dL (ref <=200)
HDL CHOLESTEROL: 39 mg/dL — ABNORMAL LOW (ref 40–60)
LDL CHOLESTEROL CALCULATED: 51 mg/dL (ref 40–99)
NON-HDL CHOLESTEROL: 88 mg/dL (ref 70–130)
TRIGLYCERIDES: 183 mg/dL — ABNORMAL HIGH (ref 0–150)
VLDL CHOLESTEROL CAL: 36.6 mg/dL (ref 11–50)

## 2022-01-04 LAB — IRON & TIBC
IRON SATURATION: 19 % — ABNORMAL LOW (ref 20–55)
IRON: 54 ug/dL — ABNORMAL LOW
TOTAL IRON BINDING CAPACITY: 289 ug/dL (ref 250–425)

## 2022-01-04 LAB — FERRITIN: FERRITIN: 149.7 ng/mL

## 2022-01-04 LAB — TSH: THYROID STIMULATING HORMONE: 0.652 u[IU]/mL (ref 0.550–4.780)

## 2022-01-04 MED ORDER — HYDROXYZINE HCL 25 MG TABLET
ORAL_TABLET | Freq: Three times a day (TID) | ORAL | 0 refills | 30 days | Status: CP | PRN
Start: 2022-01-04 — End: ?

## 2022-01-04 MED ORDER — DRONABINOL 10 MG CAPSULE
ORAL_CAPSULE | Freq: Two times a day (BID) | ORAL | 0 refills | 30 days | Status: CP
Start: 2022-01-04 — End: ?

## 2022-01-04 MED ORDER — CITALOPRAM 20 MG TABLET
ORAL_TABLET | Freq: Every day | ORAL | 2 refills | 30 days | Status: CP
Start: 2022-01-04 — End: 2023-01-04

## 2022-01-04 NOTE — Unmapped (Signed)
He's been scheduled.      Thanks,  [employer]

## 2022-01-04 NOTE — Unmapped (Signed)
Internal Medicine Clinic Visit    Reason for visit: Anxiety Follow Up    A/P:         1. Anxiety    2. Chest pain, unspecified type    3. Crohn's disease of both small and large intestine with other complication (CMS-HCC)    4. Cold intolerance        Generalized Anxiety Disorder  GAD-7 score was 19 today.  Feels his anxiety is progressively getting worse although he has noticed some improvement since starting buspirone.  I suspect that his nocturnal awakenings with dyspnea are due to nocturnal panic attacks. He has no history of asthma and he is able to lie flat every night without difficulty, lower concern for asthma or PND at this time.   - Referral sent to Psychiatry  - Increase Celexa to 20 mg daily   - Continue Buspar 20 mg TID  - Hydroxyzine 25 mg TID PRN    2. Chest Pain  Symptoms do not appear consistent with angina. Possible that this was also anxiety induced as it resolved quickly and with distraction. ECG obtained in clinic was unremarkable. ASCVD risk is 4.3%.   - Recheck lipid panel   - Return precautions given for anginal symptoms    Return in about 1 month (around 02/04/2022) for Follow up anxiety.    Staffed with Dr. Brooke Dare, seen and discussed  __________________________________________________________    HPI:    Patient reports that 2 nights ago while lying in bed he experienced abrupt onset left-sided chest pain that he describes as a sharp pain.  Pain lasted approximately 3 to 4 minutes and he had associated numbness and tingling in his left arm.  He has never had chest pain like this before.  He got up and started pacing around his room and the pain quickly resolved.  His wife gave him a baby aspirin at that time.  He has no personal or known family history of CAD or MI.  He has never had chest pain with exertion.  States that he is able to walk long distances without any chest tightness or pain.  He thinks that the pain may have worsened with deep breaths, but cannot remember.    Additionally, he feels that his anxiety is getting much worse over the past several months.  No new life events or stressors that he is aware of.  He was recently started on buspirone which he reports has helped mildly, although he still feels like his anxiety is out of control.  His wife has Atarax at home that he has started taking during acute panic attacks and says that this helps him, although will make him very sleepy.  Has also noticed that over the past 1-2 months he will wake up from sleep feeling like he cannot catch his breath.  This has  occurred approximately 6 times.  He is able to sleep flat without feelings of shortness of breath.  No history of asthma and the dyspnea usually resolves quickly on its own.  He reports that when he wakes up feeling short of breath he feels an overall sense of panic and general unease.  He was recently started on testosterone supplementation.  He has wondered if this could possibly be contributing to his worsening anxiety, although he does not think so as he feels like his anxiety was worsening prior to initiating this.       __________________________________________________________        Medications:  Reviewed in EPIC  __________________________________________________________    Physical Exam:   Vital Signs:  Vitals:    01/04/22 1509   BP: 121/68   Pulse: 63   Temp: 37.1 ??C (98.8 ??F)   TempSrc: Temporal   SpO2: 98%   Weight: 66 kg (145 lb 9.6 oz)   Height: 180.3 cm (5' 11)          Gen: Anxious appearing  CV: RRR, no murmurs  Pulm: CTA bilaterally, no crackles or wheezes  Abd: Soft, NTND, normal BS.   Ext: No edema         GAD-7 Score:  GAD-7 Total Score: 19    Medication adherence and barriers to the treatment plan have been addressed. Opportunities to optimize healthy behaviors have been discussed. Patient / caregiver voiced understanding.

## 2022-01-04 NOTE — Unmapped (Signed)
[facility]     Reason for visit: 3 month follow up    Questions / Concerns that need to be addressed: tingling in left arm, few weeks ago woke up from sleep and could not breathe            Hypertension:  Have blood pressure cuff at home?: yes- arm cuff  Regularly checking blood pressure?: no  If patient has a  record of recent blood pressures, please enter into flowsheets using this link     PTHomeBP              Allergies reviewed: Yes    Medication reviewed: Yes  Pended refills? No        HCDM reviewed and updated in Epic:    We are working to make sure all of our patients??? wishes are updated in Epic and part of that is documenting a Environmental health practitioner for each patient  A Health Care Decision Maker is someone you choose who can make health care decisions for you if you are not able - who would you most want to do this for you????  is already up to date.        BPAs completed:        COVID-19 Vaccine Summary  Which COVID-19 Vaccine was administered  Janssen  Type:  Dates Given:                   If no: Are you interested in scheduling?     Immunization History   Administered Date(s) Administered    COVID-19 VACC,(JANSSEN)(PF) 01/27/2020    INFLUENZA TIV (TRI) PF (IM) 03/26/2009    PNEUMOCOCCAL POLYSACCHARIDE 23-VALENT 07/19/2011    PPD Test 07/18/2007, 10/25/2010    Pneumococcal Conjugate 13-Valent 07/12/2014    TD(TDVAX),ADSORBED,2LF(IM)(PF) 07/18/2014       __________________________________________________________________________________________    SCREENINGS COMPLETED IN FLOWSHEETS    HARK Screening       AUDIT       PHQ2       PHQ9          Link for Multi-language PHQ/GAD screeners: [email or web address].com/[email or web address]    P4 Suicidality Screener                GAD7       COPD Assessment       Falls Risk       .imcres

## 2022-01-05 NOTE — Unmapped (Signed)
We have increased your Celexa dose to 20 mg (1 tablet) every day. This is a medication to help with your anxiety long-term.   Continue to take your Buspar (buspirone) as you were previously.  For acute anxiety attacks, you can take Hydroxyzine (Atarax) 1 tablet up to three times a day as needed for anxiety.   I have placed a referral to psychiatry, they will contact you about scheduling an appointment.

## 2022-01-07 NOTE — Unmapped (Signed)
I saw and evaluated the patient, participating in the key portions of the service.  I reviewed the resident’s note.  I agree with the resident’s findings and plan. Caprice Wasko R Dontai Pember, MD

## 2022-01-08 MED FILL — STELARA 90 MG/ML SUBCUTANEOUS SYRINGE: SUBCUTANEOUS | 56 days supply | Qty: 1 | Fill #5

## 2022-01-09 DIAGNOSIS — K5000 Crohn's disease of small intestine without complications: Principal | ICD-10-CM

## 2022-01-09 MED ORDER — STELARA 90 MG/ML SUBCUTANEOUS SYRINGE
SUBCUTANEOUS | 5 refills | 56 days
Start: 2022-01-09 — End: ?

## 2022-01-15 DIAGNOSIS — D509 Iron deficiency anemia, unspecified: Principal | ICD-10-CM

## 2022-01-15 MED ORDER — FERROUS SULFATE 325 MG (65 MG IRON) TABLET
ORAL_TABLET | ORAL | 0 refills | 120 days | Status: CP
Start: 2022-01-15 — End: 2023-01-15

## 2022-01-15 NOTE — Unmapped (Signed)
I discussed his iron studies with his GI physician who he follows with for his Crohn's disease and he recommended starting an oral iron supplement to see if this improves the patient's symptoms. Called the patient to discuss this and he was agreeable to starting an iron supplement. Sent in rx for ferrous sulfate 325 mg every other day.

## 2022-01-17 MED ORDER — USTEKINUMAB 90 MG/ML SUBCUTANEOUS SYRINGE
SUBCUTANEOUS | 5 refills | 56 days | Status: CP
Start: 2022-01-17 — End: ?
  Filled 2022-03-05: qty 1, 56d supply, fill #0

## 2022-01-31 ENCOUNTER — Encounter: Payer: Self-pay | Admitting: Anesthesiology

## 2022-01-31 ENCOUNTER — Ambulatory Visit: Payer: Medicare Other | Attending: Anesthesiology | Admitting: Anesthesiology

## 2022-01-31 DIAGNOSIS — R200 Anesthesia of skin: Secondary | ICD-10-CM

## 2022-01-31 DIAGNOSIS — K5090 Crohn's disease, unspecified, without complications: Secondary | ICD-10-CM | POA: Diagnosis not present

## 2022-01-31 DIAGNOSIS — F1190 Opioid use, unspecified, uncomplicated: Secondary | ICD-10-CM

## 2022-01-31 DIAGNOSIS — G894 Chronic pain syndrome: Secondary | ICD-10-CM | POA: Diagnosis not present

## 2022-01-31 DIAGNOSIS — G8929 Other chronic pain: Secondary | ICD-10-CM

## 2022-01-31 DIAGNOSIS — M542 Cervicalgia: Secondary | ICD-10-CM | POA: Diagnosis not present

## 2022-01-31 DIAGNOSIS — Z79891 Long term (current) use of opiate analgesic: Secondary | ICD-10-CM

## 2022-01-31 DIAGNOSIS — M47816 Spondylosis without myelopathy or radiculopathy, lumbar region: Secondary | ICD-10-CM

## 2022-01-31 DIAGNOSIS — R109 Unspecified abdominal pain: Secondary | ICD-10-CM

## 2022-01-31 DIAGNOSIS — M549 Dorsalgia, unspecified: Secondary | ICD-10-CM

## 2022-01-31 DIAGNOSIS — M25511 Pain in right shoulder: Secondary | ICD-10-CM

## 2022-01-31 DIAGNOSIS — M5412 Radiculopathy, cervical region: Secondary | ICD-10-CM

## 2022-01-31 DIAGNOSIS — F419 Anxiety disorder, unspecified: Principal | ICD-10-CM

## 2022-01-31 MED ORDER — OXYCODONE-ACETAMINOPHEN 10-325 MG PO TABS: 1.0000 | ORAL_TABLET | Freq: Four times a day (QID) | ORAL | 0 refills | Status: AC | PRN

## 2022-01-31 MED ORDER — OXYCODONE-ACETAMINOPHEN 10-325 MG PO TABS: 1.0000 | ORAL_TABLET | Freq: Four times a day (QID) | ORAL | 0 refills | Status: DC | PRN

## 2022-01-31 MED ORDER — HYDROXYZINE HCL 25 MG TABLET
ORAL_TABLET | Freq: Three times a day (TID) | ORAL | 0 refills | 30 days | PRN
Start: 2022-01-31 — End: ?

## 2022-01-31 MED ORDER — DRONABINOL 10 MG CAPSULE
ORAL_CAPSULE | Freq: Two times a day (BID) | ORAL | 0 refills | 30 days
Start: 2022-01-31 — End: ?

## 2022-01-31 NOTE — Progress Notes (Signed)
Virtual Visit via Telephone Note  I connected with Benjamin Murillo on 01/31/22 at 11:00 AM EST by telephone and verified that I am speaking with the correct person using two identifiers.  Location: Patient: Home Provider: Pain control center   I discussed the limitations, risks, security and privacy concerns of performing an evaluation and management service by telephone and the availability of in person appointments. I also discussed with the patient that there may be a patient responsible charge related to this service. The patient expressed understanding and agreed to proceed.   History of Present Illness: I spoke with Benjamin Murillo via telephone as we are unable like for the video portion of the conference.  He reports that his abdominal pain has been more stable recently but he is due to see his gastroenterologist soon.  No other changes in his neck or back pain are noted and he is taking his medications as prescribed and these continue to work well for him.  He averages about every 6 hours as needed at about 75 tablets/month and he has been on this chronically and this has been working well.  No changes in the quality characteristic or distribution of his abdominal or neck or back pain are noted.  He is continue to stay active taking his medications to enable him to do so.  He is sleeping well with his medicines and having no side effects.  Otherwise he is in his usual state of health  Review of systems: General: No fevers or chills Pulmonary: No shortness of breath or dyspnea Cardiac: No angina or palpitations or lightheadedness GI: No abdominal pain or constipation Psych: No depression    Observations/Objective:  Current Outpatient Medications:    [START ON 03/02/2022] oxyCODONE-acetaminophen (PERCOCET) 10-325 MG tablet, Take 1 tablet by mouth every 6 (six) hours as needed for pain., Disp: 75 tablet, Rfl: 0   amlodipine-atorvastatin (CADUET) 10-10 MG tablet, Take 1 tablet by mouth  daily., Disp: , Rfl:    citalopram (CELEXA) 10 MG tablet, Take 10 mg by mouth daily as needed., Disp: , Rfl:    dronabinol (MARINOL) 10 MG capsule, Take 10 mg by mouth 2 (two) times daily before a meal., Disp: , Rfl:    erythromycin ophthalmic ointment, Place 1 Application into the left eye at bedtime., Disp: 3.5 g, Rfl: 0   famotidine (PEPCID) 20 MG tablet, Take 20 mg by mouth 2 (two) times daily., Disp: , Rfl:    gabapentin (NEURONTIN) 100 MG capsule, Take 1 capsule (100 mg total) by mouth at bedtime., Disp: 30 capsule, Rfl: 3   gabapentin (NEURONTIN) 300 MG capsule, Take 1 capsule (300 mg total) by mouth 3 (three) times daily., Disp: 90 capsule, Rfl: 3   hyoscyamine (LEVSIN, ANASPAZ) 0.125 MG tablet, Take 0.125 mg by mouth as needed. , Disp: , Rfl:    loperamide (IMODIUM A-D) 2 MG tablet, Take 2 mg by mouth as needed., Disp: , Rfl:    naloxone (NARCAN) nasal spray 4 mg/0.1 mL, For respiratory depression from opioids, Disp: 1 kit, Rfl: 2   oxyCODONE-acetaminophen (PERCOCET) 10-325 MG tablet, Take 1 tablet by mouth every 6 (six) hours as needed for pain., Disp: 75 tablet, Rfl: 0   predniSONE (STERAPRED UNI-PAK 21 TAB) 10 MG (21) TBPK tablet, 6 tablets day one and day two, five tablets day three and day four, and daily taper over 12days as directed, Disp: 42 tablet, Rfl: 0   prochlorperazine (COMPAZINE) 5 MG tablet, Take 5 mg by mouth every  8 (eight) hours as needed., Disp: , Rfl:    STELARA 90 MG/ML SOSY injection, Inject 90 mg as directed., Disp: , Rfl:    Past Medical History:  Diagnosis Date   Cervical radiculitis 12/22/2019   Cervical radiculopathy    Chronic abdominal pain    Chronic neck pain    Cold    Crohn disease (HCC)    GERD (gastroesophageal reflux disease)    no meds-2-3 times weekly   Hand fracture, left    Headache(784.0)    Hypertension    no meds    IBS (irritable bowel syndrome)    Shortness of breath    "sometimes laying down; sometimes w/activity" (10/13/2012)      Assessment and Plan: 1. Cervicalgia   2. Crohn's disease without complication, unspecified gastrointestinal tract location ([place])   3. Chronic pain syndrome   4. Chronic, continuous use of opioids   5. Chronic abdominal pain   6. Facet arthritis of lumbar region   7. Chronic neck and back pain   8. Cervical radiculitis   9. Right arm numbness   10. Acute pain of right shoulder   11. Long term current use of opiate analgesic   Based on our conversation it is appropriate to refill his medicines for the next 2 months.  He is currently out of medication and due for refill.  Dates will be for January 4 and February 3 with return to clinic scheduled in 2 months.  We talked about some exercises for stretching strengthening and I encouraged him additionally to follow-up with his GI doctors for his chronic abdominal pain and medical doctors for baseline medical management.  Follow Up Instructions:    I discussed the assessment and treatment plan with the patient. The patient was provided an opportunity to ask questions and all were answered. The patient agreed with the plan and demonstrated an understanding of the instructions.   The patient was advised to call back or seek an in-person evaluation if the symptoms worsen or if the condition fails to improve as anticipated.  I provided 30 minutes of non-face-to-face time during this encounter.   [name], MD

## 2022-02-01 MED ORDER — HYDROXYZINE HCL 25 MG TABLET
ORAL_TABLET | Freq: Three times a day (TID) | ORAL | 0 refills | 30 days | PRN
Start: 2022-02-01 — End: ?

## 2022-02-01 MED ORDER — DRONABINOL 10 MG CAPSULE
ORAL_CAPSULE | Freq: Two times a day (BID) | ORAL | 0 refills | 30 days | Status: CP
Start: 2022-02-01 — End: ?

## 2022-02-06 DIAGNOSIS — F419 Anxiety disorder, unspecified: Principal | ICD-10-CM

## 2022-02-06 MED ORDER — HYDROXYZINE HCL 25 MG TABLET
ORAL_TABLET | Freq: Three times a day (TID) | ORAL | 0 refills | 30 days | PRN
Start: 2022-02-06 — End: ?

## 2022-02-07 MED ORDER — HYDROXYZINE HCL 25 MG TABLET
ORAL_TABLET | Freq: Three times a day (TID) | ORAL | 0 refills | 30 days | Status: CP | PRN
Start: 2022-02-07 — End: ?

## 2022-02-09 MED ORDER — PROMETHAZINE 25 MG TABLET
ORAL_TABLET | Freq: Four times a day (QID) | ORAL | 11 refills | 6 days | PRN
Start: 2022-02-09 — End: ?

## 2022-02-13 MED ORDER — PROMETHAZINE 25 MG TABLET
ORAL_TABLET | Freq: Four times a day (QID) | ORAL | 11 refills | 6 days | Status: CP | PRN
Start: 2022-02-13 — End: ?

## 2022-02-26 NOTE — Unmapped (Signed)
[facility] Refill Coordination Note    Specialty Medication(s) to be Shipped:   Inflammatory Disorders: Stelara    Other medication(s) to be shipped: No additional medications requested for fill at this time     Rodney Bender, DOB: [birth year]  Phone: [phone] (home)       All above HIPAA information was verified with patient.     Was a Nurse, learning disability used for this call? No    Completed refill call assessment today to schedule patient's medication shipment from the [facility] [phone]).  All relevant notes have been reviewed.     Specialty medication(s) and dose(s) confirmed: Regimen is correct and unchanged.   Changes to medications: Rodney Bender reports no changes at this time.  Changes to insurance: No  New side effects reported not previously addressed with a pharmacist or physician: None reported  Questions for the pharmacist: No    Confirmed patient received a Conservation officer, historic buildings and a Surveyor, mining with first shipment. The patient will receive a drug information handout for each medication shipped and additional FDA Medication Guides as required.       DISEASE/MEDICATION-SPECIFIC INFORMATION        For patients on injectable medications: Patient currently has 0 doses left.  Next injection is scheduled for 03/09/22.    SPECIALTY MEDICATION ADHERENCE     Medication Adherence    Patient reported X missed doses in the last month: 0  Specialty Medication: STELARA 90 mg/m  Patient is on additional specialty medications: No                                Were doses missed due to medication being on hold? No    Stelara 90 mg/ml: 0 days of medicine on hand        REFERRAL TO PHARMACIST     Referral to the pharmacist: Not needed      [facility]     Shipping address confirmed in Epic.     Delivery Scheduled: Yes, Expected medication delivery date: 03/05/22.     Medication will be delivered via Same Day Courier to the prescription address in Epic W[facility].    [facility] Specialty Technician

## 2022-03-01 ENCOUNTER — Ambulatory Visit: Admit: 2022-03-01 | Discharge: 2022-03-01 | Payer: MEDICAID

## 2022-03-01 DIAGNOSIS — K50818 Crohn's disease of both small and large intestine with other complication: Principal | ICD-10-CM

## 2022-03-01 DIAGNOSIS — F419 Anxiety disorder, unspecified: Principal | ICD-10-CM

## 2022-03-01 DIAGNOSIS — D508 Other iron deficiency anemias: Principal | ICD-10-CM

## 2022-03-01 LAB — FERRITIN: FERRITIN: 159.1 ng/mL

## 2022-03-01 LAB — IRON PANEL
IRON SATURATION: 23 % (ref 20–55)
IRON: 70 ug/dL
TOTAL IRON BINDING CAPACITY: 301 ug/dL (ref 250–425)

## 2022-03-01 LAB — VITAMIN B12: VITAMIN B-12: 384 pg/mL (ref 211–911)

## 2022-03-01 MED ORDER — CITALOPRAM 10 MG TABLET
ORAL_TABLET | Freq: Every day | ORAL | 11 refills | 30 days | Status: CP
Start: 2022-03-01 — End: 2023-03-01

## 2022-03-01 MED ORDER — THERAPEUTIC MULTIVITAMIN TABLET
ORAL_TABLET | Freq: Every day | ORAL | 3 refills | 90 days | Status: CP
Start: 2022-03-01 — End: ?

## 2022-03-01 NOTE — Unmapped (Signed)
Internal Medicine Clinic Visit    Reason for visit: Anxiety follow up    A/P:         1. Crohn's disease of both small and large intestine with other complication (CMS-HCC)    2. Anxiety    3. Other iron deficiency anemia      Generalized Anxiety Disorder  GAD-7 score was 15 today, mildly improved from 19 in December, although anxiety continues to have a significant impact on his daily life. He is not sure if he has had improvement in symptoms since increase Celexa dose. Continues to have frequent nocturnal panic attacks.    - Referral sent to [facility] for further recommendations regarding medications  - Increase Celexa to 30 mg daily   - Continue Buspar 20 mg TID  - Hydroxyzine 25 mg TID PRN  - Provided patient with local mental health resources and contact info    2. Iron deficiency anemia  Most recent iron studies in December 2023 were consistent with iron deficiency anemia.  Started PO iron supplementation in December.   - Continue ferrous sulfate 325 mg every other day  - Repeat iron panel, ferritin  - Will check B12 as well given his Crohn's disease and increased risk of malabsorption    Return in about 6 weeks (around 04/12/2022) for Recheck.    Staffed with Dr. Brooke Dare, seen and discussed    __________________________________________________________    HPI:    Patient presents today for follow up regarding his anxiety. We had increased his Celexa dose at his last visit in December. He is not sure if this has made much of a difference. He continues to feel anxious most days and continues to have frequent nocturnal panic attacks. He has not tried using the PRN Hydroxyzine during the day or during his nocturnal events. Patient's mood appeared low today and he was minimally interactive during our visit. Discussed options such as talk therapy which he stated he would consider. He does endorse feelings of sadness and hopelessness as well. Denies any thoughts of self-harm or suicidal ideation. When asked what he thinks would be most beneficial, he states I don't know. He has not been able to make an appointment with [facility] yet. We discussed the psych consult service for medication recommendations and he was agreeable to this.   __________________________________________________________    Medications:  Reviewed in EPIC  __________________________________________________________    Physical Exam:   Vital Signs:  Vitals:    03/01/22 1414   BP: 124/78   Pulse: 64   Temp: 36.9 ??C (98.4 ??F)   TempSrc: Temporal   SpO2: 95%   Weight: 67.4 kg (148 lb 9.6 oz)   Height: 180.3 cm (5' 11)        Gen: No acute distress, sitting in chair comfortably  CV: RRR, no murmurs  Pulm: CTA bilaterally, no crackles or wheezes  Abd: Soft, NTND, normal BS.   Ext: No edema  Mood: Flat affect, soft voice, does not make direct eye contact     PHQ-9 Score:     GAD-7 Score:  GAD-7 Total Score: 15    Medication adherence and barriers to the treatment plan have been addressed. Opportunities to optimize healthy behaviors have been discussed. Patient / caregiver voiced understanding.    Theda Sers, MD  PGY-1, [facility]

## 2022-03-01 NOTE — Unmapped (Signed)
[facility] at [facility]     Reason for visit: 1 month follow up anxiety    Questions / Concerns that need to be addressed:   Has not heard from psychiatry yet about an appointment          Hypertension:  Have blood pressure cuff at home?: yes- arm cuff  Regularly checking blood pressure?: no  If patient has a  record of recent blood pressures, please enter into flowsheets using this link     PTHomeBP            Allergies reviewed: Yes    Medication reviewed: Yes  Pended refills? Yes        HCDM reviewed and updated in Epic:    We are working to make sure all of our patients??? wishes are updated in Epic and part of that is documenting a Environmental health practitioner for each patient  A Health Care Decision Maker is someone you choose who can make health care decisions for you if you are not able - who would you most want to do this for you????  is already up to date.        BPAs completed:        COVID-19 Vaccine Summary  Which COVID-19 Vaccine was administered  Janssen  Type:  Dates Given:                   If no: Are you interested in scheduling?     Immunization History   Administered Date(s) Administered    COVID-19 VACC,(JANSSEN)(PF) 01/27/2020    INFLUENZA TIV (TRI) PF (IM) 03/26/2009    PNEUMOCOCCAL POLYSACCHARIDE 23-VALENT 07/19/2011    PPD Test 07/18/2007, 10/25/2010    Pneumococcal Conjugate 13-Valent 07/12/2014    TD(TDVAX),ADSORBED,2LF(IM)(PF) 07/18/2014       __________________________________________________________________________________________    SCREENINGS COMPLETED IN FLOWSHEETS    HARK Screening       AUDIT       PHQ2       PHQ9          Link for Multi-language PHQ/GAD screeners: [email or web address]    P4 Suicidality Screener                GAD7       COPD Assessment       Falls Risk       .imcres

## 2022-03-01 NOTE — Unmapped (Addendum)
It was good to see you today. Here is a summary of what we talked about in clinic:   I have increased your dose of Celexa to 30 mg. Please take 3 tablets (30 mg total) once a day.  Continue to take the Buspar three times a day  When you feel like your anxiety is overwhelming or you wake up at night feeling anxious or distressed, try taking Atarax 1 tablet. You can take this up to three times a day as needed.     Psych[employer] Today has an Marine scientist, in which you can put in your location, insurance, and other preferences (type of counseling, gender, etc) and they will show you the available providers in your area.     Please call the number below to schedule an appointment with [facility] Psychiatry.   [facility]  [phone]  [address], [address]  [address]  [place], [place] [address]      Mental Health: General Mental Health Resources: Suicide & Crisis Hotlines   [employer]   Phone: (352)876-7997    Online chat: www.hopeline.com    [place]   Phone: 1-800-SUICIDE (219-381-3505)    National Suicide Prevention Lifeline    Phone: 5-284-132-GMWN (231-854-2566)   Online chat: www.suicidepreventionlifeline.org    Online chat (ages 66-24): https://[email or web address]    How to Find Mental Health Treatment   If you have health insurance, look on your insurance card for the phone number or website to access a list of mental health providers who accept your insurance. For Medicare covered services go to [email or web address]    Below is a list of local agencies that accept multiple insurance carriers. Please contact them directly about acceptance of your insurance.      [facility]: [phone]  Accepts most major insurers. Counseling and psychiatric treatment available. In-person and teletherapy options.  Locations across [place]:  [address], [place], [address], [place], [place], [place], [place], [place], [place], [place], [place], [facility], [place], [place]          [employer] Organizations [facility]):   For immediate risk of suicide or self-harm, call 911 or go to your local emergency department.     National Suicide Hotline  Call 988, chat at [employer].org, or crisis text HOME to (416)607-4192 and for TTY users, use preferred relay service, or dial 711, then 988, to talk with a counselor.     The [facility] 24 hours daily: Call (517)531-7993 or 862-598-4592  Text available for these numbers Monday-Friday 3pm-9pm    [facility] Psychiatry Providers        [facility]  [address] Floor, [place] [address]   *Locations in [place], [place], and [place]  [phone]  Hours: Monday - Friday: 8:00 a.m. - 5:00 p.m.  Website: [email or web address]    [facility]              [address] [place], [place] [address]  Phone: [phone]  Hours: Mon-Fri 8:30 am to 4:00 pm                 Website: [email or web address]                          Insurances accepted: [employer], mining, Medicare  Services: Individual counseling; Medication Management; psychotherapy, family counseling, couples therapy     [facility]  [phone]  [address] [place], [place]        Phone: [phone]   To schedule new patient appointment: [phone]  Website: [email or web address]                     Insurances accepted: [address], [place] [employer], [address], [address], [address], [place], [place], [place], [employer], [place], [place], Workers??? Compensation  Services provided: Individual counseling; Medication Management, [employer] [facility], Addiction recovery, gender and sexual diversity center, group programs  *multiple locations in [place], [place], [place], [place], [place], [place], [facility], [place]

## 2022-03-02 DIAGNOSIS — F419 Anxiety disorder, unspecified: Principal | ICD-10-CM

## 2022-03-02 MED ORDER — CITALOPRAM 10 MG TABLET
ORAL_TABLET | Freq: Every day | ORAL | 11 refills | 30 days
Start: 2022-03-02 — End: 2023-03-02

## 2022-03-03 MED ORDER — CITALOPRAM 10 MG TABLET
ORAL_TABLET | Freq: Every day | ORAL | 11 refills | 30 days
Start: 2022-03-03 — End: 2023-03-03

## 2022-03-04 ENCOUNTER — Ambulatory Visit: Admit: 2022-03-04 | Discharge: 2022-03-05 | Payer: MEDICAID

## 2022-03-04 DIAGNOSIS — F419 Anxiety disorder, unspecified: Principal | ICD-10-CM

## 2022-03-04 DIAGNOSIS — E291 Testicular hypofunction: Principal | ICD-10-CM

## 2022-03-04 DIAGNOSIS — R6882 Decreased libido: Principal | ICD-10-CM

## 2022-03-04 DIAGNOSIS — N528 Other male erectile dysfunction: Principal | ICD-10-CM

## 2022-03-04 LAB — TESTOSTERONE: TESTOSTERONE TOTAL: 230 ng/dL

## 2022-03-04 LAB — PSA: PROSTATE SPECIFIC ANTIGEN: 0.65 ng/mL (ref 0.00–4.00)

## 2022-03-04 MED ORDER — HYDROXYZINE HCL 25 MG TABLET
ORAL_TABLET | Freq: Three times a day (TID) | ORAL | 0 refills | 30 days | PRN
Start: 2022-03-04 — End: ?

## 2022-03-04 MED ORDER — TESTOSTERONE 20.25 MG/1.25 GRAM PER PUMP ACT.(1.62 %) TRANSDERMAL GEL
Freq: Every day | TRANSDERMAL | 5 refills | 0 days | Status: CP
Start: 2022-03-04 — End: ?

## 2022-03-04 NOTE — Unmapped (Signed)
Assessment:   Diagnosis ICD-10-CM Associated Orders   1. Low libido  R68.82 Hematocrit     Testosterone     Estradiol (Estrogen) Level     Prolactin     Luteinizing hormone     Follicle stimulating hormone     PSA (Prostate Specific Antigen)      2. ED K50.818 Ambulatory referral to Urology        Patient is a 52 year old male with a history of Crohn's, hypertension, and anxiety following up on ED/ hypogonadism. Currently on viagra 100 mg prn ED and testosterone gel, two pumps.        Plan:  -Discussed intervention options for erectile dysfunction. He wants to continue Viagra to 100 mg PRN. Discussed proper medication use. We discussed he may take this 30 minutes to four hours prior to intercourse to help with erections.  This medication is best on an empty stomach.  We also discussed he may not takes nitrates for chest pain with this medication and that he should go to the ED if he has an erection lasting more than four hours. ([email or web address] to create an account)  - Will draw a hormone profile today (hematocrit,  t, psa).   - continue testosterone gel     Counseling:    We discussed that options include observation with implementation of dietary/exercise strategies versus testosterone replacement (with gel, injections, pellets, etc) and alternative therapies such as Clomid and/or Arimidex to increase natural production. We also discussed that losing weight is a means of naturally improving testosterone levels. Studies suggest dietary modifications including drinking less alcohol, and eating a well-balanced diet with lots of vegetables can also help.  Also stress management and improved sleep habits were discussed.  I suggested a trial of 6 months of diet/exercise strategies prior to medical therapy.    We reviewed risks of TRT including but not limited to acne, fluid retention, BPH or urinary symptoms, breast enlargement, worsening of sleep apnea, infertility, changes in blood count requiring phlebotomy, changes in cholesterol requiring additional medical therapy, blood clots including deep venous thrombosis or thromboembolic events, and prostate cancer progression.  We also discussed recent literature suggesting an increase in cardiovascular events (including heart attack/stroke) and mortality, but we also reviewed that there is conflicting literature in this regard.  He understands these risks and wishes to proceed.  We also reviewed the [employer]'s Position Statement on testosterone therapy with him and the new FDA warning, and these statements were provided in writing.    We discussed that he will need to be followed at regular intervals for physical exam (including digital rectal exam), reassessment of symptoms, and lab work including PSA, hormone levels, and complete blood counts.  After being fully advised of these risks and expressing understanding, he would like to pursue therapy.  He was given a handout delineating the importance of regular follow-up and risks of testosterone replacement therapy, including but not limited to those noted above.     Referring Provider:  Dr. Caprice Kluver    PCP:   Theda Sers, MD    Reason for Visit:  Erectile dysfunction    HPI:  [facility]  is a pleasant 52 y.o. year-old male who is being seen today for erectile dysfunction.    He reports issues with having and maintaining an erection for a few years now. He attempts IC weekly and has success with penetration less than 50% of the time. Currently taking Viagra 50mg  as needed for the last 6 months with  no benefit. This is the first intervention he has tried. He takes it on an empty stomach 1 hour before intercourse with stimulation.    He also reports low libido. He states he has a complicated history of Crohn's which has significantly impacted his sex drive. He states his sex drive is bad all the time now, and his overall energy tends to be low. His GI doctor at [facility] ran tests 2-3 weeks ago; he says he believes they checked his testosterone and it was low. A testosterone could not be found in his chart.    SHIMs score: 10  Recent labs: TSH: 0.509 (low), Vit D: 26.7 (normal), Hgb: 11.6 (low), Hematocrit: 33.7 (low)    Has hypertension and states it is relatively well controlled. Denies diabetes. Denies current tobacco or drug use, other than MJ.        Interval 03/04/22:    Hypogonadism: He started testosterone about one month ago. States his energy has improved somewhat. He states he still has a low libido. He did put on the medication this morning. Here to repeat levels.     ED: He's tried viagra 100 mg prn x 3-4 times. Tried one hour prior to IC + stimulation, to no avail. Not ready to switch to injections/ try implant.     He does smell heavily of MJ.      Latest Reference Range & Units 11/12/21 10:32   HCT 39.0 - 48.0 % 38.9 (L)   (L): Data is abnormally low     Latest Reference Range & Units 11/12/21 10:32   Prostate Specific Antigen 0.00 - 4.00 ng/mL 0.47        Latest Reference Range & Units 11/23/21 08:55   Testosterone 197 - 670 ng/dL 161         Today, he reports that he is doing well but feels tired. Today, he denies fever, chills, chest pain, shortness of breath, cough, wheezing, nausea, vomiting, abdominal pain, flank pain, diarrhea, constipation, dysuria, hematuria, rashes, open wounds, edema, and weakness or numbness in the upper or lower extremities.     Past medical history:  Past Medical History:   Diagnosis Date    Acid reflux     Anxiety     Crohn's disease (CMS-HCC)     diagnosed in 1990    GERD (gastroesophageal reflux disease)     Hypertension 10/08/2016       Past surgical history:  Past Surgical History:   Procedure Laterality Date    COLON SURGERY      PR COLONOSCOPY FLX DX W/COLLJ SPEC WHEN PFRMD Left 11/10/2012    Procedure: COLONOSCOPY, FLEXIBLE, PROXIMAL TO SPLENIC FLEXURE; DIAGNOSTIC, W/WO COLLECTION SPECIMEN BY BRUSH OR WASH;  Surgeon: Malcolm Metro, MD;  [facility];  Service: Gastroenterology    PR COLONOSCOPY FLX DX W/COLLJ SPEC WHEN PFRMD N/A 07/21/2013    Procedure: COLONOSCOPY, FLEXIBLE, PROXIMAL TO SPLENIC FLEXURE; DIAGNOSTIC, W/WO COLLECTION SPECIMEN BY BRUSH OR WASH;  Surgeon: Gwen Pounds, MD;  [facility]: [facility];  Service: Gastroenterology    PR COLONOSCOPY FLX DX W/COLLJ SPEC WHEN PFRMD N/A 08/09/2016    Procedure: COLONOSCOPY, FLEXIBLE, PROXIMAL TO SPLENIC FLEXURE; DIAGNOSTIC, W/WO COLLECTION SPECIMEN BY BRUSH OR WASH;  Surgeon: Janyth Pupa, MD;  Location: [facility];  Service: Gastroenterology    PR COLONOSCOPY FLX DX W/COLLJ SPEC WHEN PFRMD N/A 04/06/2018    Procedure: COLONOSCOPY, FLEXIBLE, PROXIMAL TO SPLENIC FLEXURE; DIAGNOSTIC, W/WO COLLECTION SPECIMEN BY  BRUSH OR WASH;  Surgeon: Zetta Bills, MD;  [facility];  Service: Gastroenterology    PR COLONOSCOPY FLX DX W/COLLJ SPEC WHEN PFRMD N/A 10/18/2021    Procedure: COLONOSCOPY, FLEXIBLE, PROXIMAL TO SPLENIC FLEXURE; DIAGNOSTIC, W/WO COLLECTION SPECIMEN BY BRUSH OR WASH;  Surgeon: Hunt Oris, MD;  Location: [facility];  Service: Gastroenterology    PR COLONOSCOPY W/BIOPSY SINGLE/MULTIPLE  07/23/2012    Procedure: COLONOSCOPY, FLEXIBLE, PROXIMAL TO SPLENIC FLEXURE; WITH BIOPSY, SINGLE OR MULTIPLE;  Surgeon: Vickii Chafe, MD;  Location: [facility];  Service: Gastroenterology    PR COLONOSCOPY W/BIOPSY SINGLE/MULTIPLE N/A 07/15/2014    Procedure: COLONOSCOPY, FLEXIBLE, PROXIMAL TO SPLENIC FLEXURE; WITH BIOPSY, SINGLE OR MULTIPLE;  Surgeon: Janyth Pupa, MD;  Location: [facility];  Service: Gastroenterology    PR COLONOSCOPY W/BIOPSY SINGLE/MULTIPLE N/A 03/21/2017    Procedure: COLONOSCOPY, FLEXIBLE, PROXIMAL TO SPLENIC FLEXURE; WITH BIOPSY, SINGLE OR MULTIPLE;  Surgeon: Modena Nunnery, MD;  [facility];  Service: Gastroenterology    PR COLONOSCOPY W/BIOPSY SINGLE/MULTIPLE N/A 02/12/2019    Procedure: COLONOSCOPY, FLEXIBLE, PROXIMAL TO SPLENIC FLEXURE; WITH BIOPSY, SINGLE OR MULTIPLE;  Surgeon: Jules Husbands, MD;  Location: [facility];  Service: Gastroenterology    PR COLONOSCOPY W/BIOPSY SINGLE/MULTIPLE Left 05/15/2020    Procedure: COLONOSCOPY, FLEXIBLE, PROXIMAL TO SPLENIC FLEXURE; WITH BIOPSY, SINGLE OR MULTIPLE;  Surgeon: Rona Ravens, MD;  [facility];  Service: Gastroenterology    PR COLSC FLEXIBLE W/TRANSENDOSCOPIC BALLOON DILAT N/A 07/15/2014    Procedure: COLONOSCOPY, FLEXIBLE; WITH DILATION BY BALLOON, 1 OR MORE STRICTURES;  Surgeon: Janyth Pupa, MD;  [facility]: [facility];  Service: Gastroenterology    PR REMVL COLON & TERM ILEUM W/ILEOCOLOSTOMY N/A 09/16/2016    Procedure: COLECTOMY, PARTIAL, WITH REMOVAL OF TERMINAL ILEUM WITH ILEOCOLOSTOMY;  Surgeon: Lady Gary, MD;  Location: [facility];  Service: Gastrointestinal    PR REPAIR BICEPS LONG TENDON Right 03/19/2019    Procedure: TENODESIS LONG TENDON BICEPS;  Surgeon: Gonzella Lex, MD;  Location: [facility];  Service: Orthopedics    PR SHLDR ARTHROSCOP,PART ACROMIOPLAS Right 10/02/2018    Procedure: R16 ARTHROSCOPY, SHOULDER, SURGICAL; DECOMPRESS SUBACROMIAL SPACE W/PART ACROMIOPLASTY, Tamala Bari;  Surgeon: Gonzella Lex, MD;  Location: ASC OR [facility];  Service: Orthopedics    PR SHLDR ARTHROSCOP,SURG,W/ROTAT CUFF REPR Right 10/02/2018    Procedure: ARTHROSCOPY, SHOULDER, SURGICAL; WITH ROTATOR CUFF REPAIR;  Surgeon: Gonzella Lex, MD;  Location: ASC OR [facility];  Service: Orthopedics    PR SHLDR ARTHROSCOP,SURG,W/ROTAT CUFF REPR Right 03/19/2019    Procedure: R16 ARTHROSCOPY, SHOULDER, SURGICAL; WITH ROTATOR CUFF REPAIR;  Surgeon: Gonzella Lex, MD;  Location: ASC OR [facility];  Service: Orthopedics    PR UPPER GI ENDOSCOPY,BIOPSY N/A 07/23/2012    Procedure: UGI ENDOSCOPY; WITH BIOPSY, SINGLE OR MULTIPLE;  Surgeon: Vickii Chafe, MD;  [facility]: [facility];  Service: Gastroenterology    PR UPPER GI ENDOSCOPY,DIAGNOSIS N/A 11/10/2012    Procedure: UGI ENDO, INCLUDE ESOPHAGUS, STOMACH, & DUODENUM &/OR JEJUNUM; DX W/WO COLLECTION SPECIMN, BY BRUSH OR WASH;  Surgeon: Malcolm Metro, MD;  Location: [facility];  Service: Gastroenterology    PR UPPER GI ENDOSCOPY,DIAGNOSIS N/A 07/21/2013    Procedure: UGI ENDO, INCLUDE ESOPHAGUS, STOMACH, & DUODENUM &/OR JEJUNUM; DX W/WO COLLECTION SPECIMN, BY BRUSH OR WASH;  Surgeon: Gwen Pounds, MD;  Location: [facility] [place];  Service: Gastroenterology    PR UPPER GI ENDOSCOPY,DIAGNOSIS N/A 07/15/2014    Procedure: UGI ENDO, INCLUDE ESOPHAGUS, STOMACH, & DUODENUM &/OR JEJUNUM; DX W/WO COLLECTION SPECIMN, BY BRUSH OR WASH;  Surgeon: Janyth Pupa, MD;  [facility]: [facility];  Service: Gastroenterology    PR UPPER GI ENDOSCOPY,DIAGNOSIS N/A 10/18/2021    Procedure: UGI ENDO, INCLUDE ESOPHAGUS, STOMACH, & DUODENUM &/OR JEJUNUM; DX W/WO COLLECTION SPECIMN, BY BRUSH OR WASH;  Surgeon: Hunt Oris, MD;  Location: [facility];  Service: Gastroenterology    ROTATOR CUFF REPAIR Right 2020    SHOULDER SURGERY      SPINE SURGERY         Social history:  Social History     Social History Narrative    Information taken:  03/31/19        Born [place], [place]        Raised with grandmother        2 siblings- pt is the youngest    Brother lives in [place], [place]    Sister lives in [place], [place]        Married for 60yrs to [employer]        No children         Education - completed through 11th grade I got kicked out at 12th grade        Not working, on disability benefits- Crohn's disease       Family history:  family history includes Cancer in his father and maternal aunt; Hyperlipidemia in his father; No Known Problems in his brother, maternal grandfather, maternal grandmother, maternal uncle, paternal aunt, paternal grandfather, paternal grandmother, paternal uncle, and sister; Stroke in his mother.    MEDICATIONS:   Current Outpatient Medications   Medication Sig Dispense Refill    busPIRone (BUSPAR) 10 MG tablet TAKE 2 TABLETS BY MOUTH THREE TIMES DAILY 180 tablet 0    citalopram (CELEXA) 10 MG tablet Take 3 tablets (30 mg total) by mouth daily. 90 tablet 11    dronabinol (MARINOL) 10 MG capsule Take 1 capsule (10 mg total) by mouth Two (2) times a day (30 minutes before a meal). 60 capsule 0    ferrous sulfate 325 (65 FE) MG tablet Take 1 tablet (325 mg total) by mouth every other day. 60 tablet 0    gabapentin (NEURONTIN) 300 MG capsule Take 2 capsules (600 mg total) by mouth Three (3) times a day. 180 capsule 11    hydrOXYzine (ATARAX) 25 MG tablet Take 1 tablet (25 mg total) by mouth Three (3) times a day as needed for anxiety. 90 tablet 0    loperamide (IMODIUM A-D) 2 mg tablet Take 1 tablet (2 mg total) by mouth daily as needed.      naloxone (NARCAN) 4 mg nasal spray       OLANZapine (ZYPREXA) 2.5 MG tablet Take 1 tablet by mouth nightly 30 tablet 5    oxyCODONE-acetaminophen (PERCOCET) 10-325 mg per tablet Take 1 tablet by mouth every eight (8) hours as needed.      pantoprazole (PROTONIX) 40 MG tablet Take 1 tablet (40 mg total) by mouth daily as needed. 90 tablet 3    promethazine (PHENERGAN) 25 MG tablet Take 2 tablets (50 mg total) by mouth every six (6) hours as needed for nausea. 45 tablet 11    sildenafiL (VIAGRA) 100 MG tablet Take 1 tablet (100 mg total) by mouth daily as needed for erectile dysfunction. 90 tablet 3  testosterone 20.25 mg/1.25 gram (1.62 %) gel pump Place 2 sprays (40.5 mg total) on the skin daily. 75 g 5    therapeutic multivitamin (THERAGRAN) tablet Take 1 tablet by mouth daily. 90 tablet 3    ustekinumab (STELARA) 90 mg/mL Syrg syringe Inject the contents of 1 syringe (90 mg total) under the skin every 8 weeks. 1 mL 5     No current facility-administered medications for this visit.       Allergies:  Allergies   Allergen Reactions    Tramadol Itching and Anxiety     Other reaction(s): Other (See Comments)    Penicillins      Other reaction(s): Itching-Allergy    Dilaudid [Hydromorphone] Anxiety    Morphine Itching       Review of Systems:  Positive for anxiety and difficulty having and maintaining an erection.  Otherwise, a 10 system ROS questionnaire completed by the patient and reviewed by me was normal.    Physical Exam  Vitals:    03/04/22 1309   BP: 127/75   Pulse: 58   Temp: 36.6 ??C (97.8 ??F)   TempSrc: Temporal   Weight: 67.7 kg (149 lb 3.2 oz)   Height: 180.3 cm (5' 11)     GENERAL: The patient is a male in no acute distress.   HEENT: Normocephalic and atraumatic.   NECK: Supple with trachea midline.   LYMPHATICS: No cervical or supraclavicular lymphadenopathy.   PULMONARY: Relaxed respiratory effort on room air.   CARDIOVASCULAR: Regular rate  GASTROINTESTINAL: Soft, nontender and nondistended.   GENITOURINARY: Declined  SKIN: No signs of cyanosis or clubbing.   NEUROLOGICAL: Grossly intact.   PSYCH: Alert and oriented x 3. Flat affect.

## 2022-03-04 NOTE — Unmapped (Signed)
Last ordered: 2 days ago (03/01/2022) by Marda Stalker, MD   With 90 tabs/ 11 refills

## 2022-03-04 NOTE — Unmapped (Signed)
Hypogonadism - Patient Information  Rodney Koyanagi, MD      Introduction  Hypogonadism is a condition defined by low serum testosterone levels and symptoms such as fatigue, decreased libido, erectile dysfunction, weakness, and weight gain. It is known to occur with aging, as most men have declining testosterone levels beginning in their 30???s, but it can also be associated with particular medical conditions. Treatment is usually undertaken when these underlying conditions are causing the issue, rather than just low levels associated with aging. Treatment is important for these men because, in addition to symptoms, low testosterone levels have been associated with decreased muscle mass and strength, osteoporosis, depression, decreased cognition, ED, and metabolic syndrome. Testosterone replacement therapy (TRT) has been shown to increase lean body mass, improve bone mineral density, increase cognitive performance, and improve sexual function.     Diagnosis  Currently, there is no single consensus statement regarding diagnosis and management of hypogonadism. In general, the diagnosis requires a low serum testosterone level measured in the morning hours coupled with at least one clinical symptom of low testosterone. Absolute ranges of normal testosterone levels are difficult to establish. Therefore, treatment is generally geared toward improvement of clinical symptoms rather than an absolute serum testosterone level.     Treatment options  There are many options for TRT, each of which has its benefits and disadvantages. The decision about which one is right for you will depend on your personal preferences and a discussion with Dr. Rolan Lipa. In some cases, different insurance companies may cover one option and not another, which may also be taken into consideration. If the desired effects are not achieved with your initial choice, a different option can be tried to see if it is a better fit for you. A summary of the most commonly used TRT options is provided below.    Topical Gels: Advantages include more constant levels with daily dosing, high patient satisfaction, and avoidance of needles. Disadvantages include increased cost compared to injectables, the potential for transference of the gel to others (e.g., spouses and young children) through contact with your skin or clothes, messiness of gel application, and potential skin irritation.     Injectables: Advantages include efficacy and patient satisfaction, weekly to biweekly dosing, and low cost. Disadvantages include increased fluctuation (peaks and valleys) in testosterone levels compared to daily dosing options and the requirement for needles and self-injection. There is a higher risk of elevation of the hematocrit (red blood cell count) which would require dosing changes or blood donation.    Implantable (Testopel): The advantages of this therapy include convenience and decreased frequency of dosing. As this requires a short office procedure, there are risks including bleeding, infection, and pellet extrusion in less than 1% of cases.    Monitoring  Regardless of the type of testosterone replacement therapy chosen, you will need to be monitored at regular intervals (usually every 3-6 months); both to confirm good control of your symptoms and to ensure that there are no potentially dangerous side effects. The follow-up regimen usually consists of the following:  1. Physical examination, including digital rectal exam to rule out prostate nodules (yearly).  2. Routine blood work for testosterone levels and other hormones (every 3-6 months).  3. Routine blood work for lipids, hemoglobin and hematocrit, and PSA (prostate-specific antigen) (every 6 months).    Risks  Historically, the concern for TRT was its effect on the prostate. Despite evidence that the prostate does enlarge slightly on TRT, no studies have shown any significant  worsening of urinary symptoms while on therapy. Studies have also demonstrated no significant change in PSA while on therapy. An increasing PSA while on TRT may indicate underlying malignancy and warrants evaluation. There has been no increased risk of prostate cancer demonstrated with TRT. Additionally, studies have demonstrated no increased risk of recurrence in men on TRT after undergoing treatment for prostate cancer. Small studies of men with active prostate cancer have shown no progression of disease on TRT. Until larger studies are published, TRT should be avoided in men with known or suspected active prostate cancer.    Another major risk to be aware of is the almost certain risk to a man???s fertility. Men of reproductive age or men who may want to preserve their fertility for the future should not use testosterone. There are other medications and treatment options for these men, so be sure to discuss this with Dr. Rolan Lipa if you are concerned about preserving your fertility. Testosterone almost uniformly lowers sperm counts, sometimes to zero counts, and may irreversibly affect a man???s fertility.        Understanding the Cardiovascular Risk of Testosterone Replacement  Lenice Pressman, MD. The Journal of Sexual Medicine Volume 11, Issue 6, Article first published online: 03 Jul 2012    Two recent articles published in medical journals have suggested that men who received testosterone prescriptions were at greater risk of developing cardiovascular problems such as heart attack, stroke, and even death. Although these articles prompted a great amount of media attention, after careful scientific review many prominent experts on testosterone therapy have concluded that neither of these studies provides any definite evidence of cardiac risk from testosterone therapy. On the contrary, a rich literature spanning more than 30 years strongly suggests that increased risk of cardiovascular events is associated with low levels of testosterone; furthermore, testosterone therapy may provide cardiovascular benefits. Despite this, all patients should know that, based on these studies, the FDA has released a warning that there is a possible increased cardiovascular risk associated with testosterone use.    The first article was published in the Journal of the American Medical Association (JAMA) in November 2013 [1]. It analyzed data obtained from men who had undergone cardiac catheterization for possible heart problems, and identified men with low levels of testosterone. Some of those men eventually received a prescription for testosterone, and others did not. The investigators looked back at several years of data on these groups to determine rates of heart attacks, strokes, and deaths. They reported that although there was no significant difference in rates of these events at 1 year, 2 years, or 3 years, the overall trend over the course of the study showed a 29% increase in men who received testosterone prescriptions.    Strangely, the actual percentage of individuals who had any of these CV events was lower by half among the men who received T therapy (10.1%) compared with untreated men (21.2%). The investigators came to the opposite conclusion by complex statistics in which an event in a testosterone-treated man was given more ???weight??? than the same event in an untreated man. Moreover, the authors improperly excluded a large number of men who started testosterone after they had a heart attack or other event. If those men had been included in the ???no T??? group (which would have been appropriate as they were not taking T at the time of the event), the study would likely have concluded that men who received testosterone were less likely to have cardiac events or strokes.  The validity of this study has been questioned further due to new revelations of large errors in data. A large group of world experts have called for retraction of the study, concluding that these errors make the study ???no longer credible.    The second article was published in the journal PLoS One in January 2014 [2]. It analyzed health insurance data such as diagnosis codes and prescriptions, and reported that men who received a testosterone prescription had a greater risk of non-fatal heart attack in the short period (up to 90 days) after receiving the prescription than in the prior 12 months. Although the media made a big deal out of this study, the actual increased risk was just over 1 event for every 1,000 years of testosterone use. Such a tiny number is meaningless. More importantly, since the information was not obtained as part of any experiment, it is impossible to know whether this miniscule increase in risk was due to the prescription, or the underlying condition of low testosterone, or to other factors, such as age or smoking.    Cardiovascular Safety  Two previous studies have shown that men with low levels of testosterone who received treatment had a reduction in mortality by half compared with untreated men [3,4]. Numerous studies have shown that men with low levels of testosterone die sooner than men with normal testosterone levels [5]. Placebo-controlled studies have shown that men with congestive heart failure treated with testosterone were able to walk farther during testing than men who received placebo. Men with known coronary artery disease who received testosterone therapy were able to exercise longer on a treadmill without chest pain than men who received placebo. Increased ability to exercise may help prevent heart problems from progressing in some men. In addition, testosterone therapy improves risk factors for heart disease, such as reducing fat mass, lowering blood sugar, and reducing waist circumference. Although definitive large studies are yet to be completed, there is a great deal of evidence which strongly suggests that having a normal testosterone level, naturally or with T therapy, is beneficial for cardiovascular health.    If the Studies Linking T to Heart Problems Were So Weak, Why Did They Receive So Much Attention?  Unfortunately, there are many reasons why journal articles receive media attention, and it is not always on the strength of the science. Sensational headlines about risks of death or disease are always of interest, particularly when they are about a therapy (such as testosterone) that many people are using or are interested in using.    References:  1 Vigen R, O???Donnell CI, Bar??n AE, [address] [place], [address], [address], [place], [place], [place], [place], [place] PM. Association of testosterone therapy with mortality, myocardial infarction, and stroke in men with low  testosterone levels. JAMA 2013;310:1829-36.  2 Finkle WD, [place] S, [place], [place], [place], [place], [place] RN. Increased risk of non-fatal myocardial infarction following testosterone therapy prescriptions in men. PLoS ONE 2014;9:e85805.  3 Shores MM, Smith NL, Forsberg CW, Anawalt BD, [place] AM. Testosterone treatment and mortality in men with low testosterone levels. J Clin Endocrinol Metab 2012;97:2050-8.  4 Muraleedharan Theodoro Parma, Kapoor D, [place] Gilford Silvius [facility]. Testosterone deficiency is associated with increased risk of mortality and testosterone  replacement improves survival in men with type 2 diabetes. Eur J Endocrinol 2013  ;169:725-33.  5 Traish AM, Miner MM, Morgentaler A, Zitzmann M. Testosterone deficiency. Am J Med [phone].  6  Rayford Halsted, Morgentaler A. A new era of testosterone and prostate cancer: From physiology to clinical implications. Eur Urol [phone].      The AUA Position Statement On Testosterone Therapy (February 2014)  [email or web address]    The American Urological Association (AUA) has followed closely the recent media attention regarding reports that testosterone therapy increases cardiovascular events in men as well as the FDA's stated intent to review cardiovascular risk with this treatment in men with hypogonadism. The AUA notes there is also contradictory evidence suggesting a beneficial influence of testosterone therapy on cardiovascular risk. Definitive studies have not been performed.    The AUA is also concerned about the potential for misuse of testosterone for non-medical indications, such as body building or performance enhancement.    Hypogonadism is defined as biochemically low testosterone levels in the setting of a cluster of symptoms which may include reduced sexual desire (libido) and activity, decreased spontaneous erections, decreased energy and depressed mood. Men with hypogonadism may also experience reduced muscle mass and strength and increased body fat. Hypogonadism may also contribute to reduced bone mineral density and anemia. Testosterone therapy is an appropriate treatment for hypogonadism after full discussion of potential adverse effects. Treatment requires follow-up and medical monitoring. Testosterone therapy in the absence of hypogonadism is not appropriate.    Increased awareness about hypogonadism has been stimulated by an increase in availability and diversity of patient-acceptable forms of testosterone replacement options in recent years. The management of hypogonadism should start with careful evaluation by a physician experienced in diagnosing hypogonadism. Many of the symptoms are non-specific and may be multifactorial in origin. Hence, symptoms may not be necessarily linked to hypogonadism alone. This fact needs to be considered in the overall evaluation.    The diagnosis and management of testosterone deficiency should be made by a physician with training in the condition and its treatments. The diagnosis should be made only after taking detailed medical history, physical examination, and obtaining appropriate blood tests. Testosterone therapy should not be offered to men with normal testosterone levels. Testosterone therapy is never a treatment for infertility.    The potential adverse effects of testosterone therapy should be discussed prior to treatment. These include acne, breast swelling or tenderness, increased red blood cell count, swelling of the feet or ankles, reduced testicular size and infertility. Current evidence does not provide any definitive answers regarding the risks of testosterone therapy on prostate cancer and cardiovascular disease, and patients should be so informed.    The optimal follow-up of men on testosterone therapy has not been defined, but should include measurement of testosterone level, PSA and hematocrit. Other patient-specific measures may be appropriate.    The AUA recognizes and encourages the need for increased educational awareness of the benefits and risks of testosterone therapy among both patients and health care providers.      FDA Drug Safety Communication: FDA cautions about using testosterone products for low testosterone due to aging; requires labeling change to inform of possible increased risk of heart attack and stroke with use (03/30/2013)    The U.S. Food and Drug Administration (FDA) cautions that prescription testosterone products are approved only for men who have low testosterone levels caused by certain medical conditions. The benefit and safety of these medications have not been established for the treatment of low testosterone levels due to aging, even if a man???s symptoms seem related to low testosterone. We are requiring that the manufacturers of all approved prescription testosterone products  change their labeling to clarify the approved uses of these medications. We are also requiring these manufacturers to add information to the labeling about a possible increased risk of heart attacks and strokes in patients taking testosterone. Health care professionals should prescribe testosterone therapy only for men with low testosterone levels caused by certain medical conditions and confirmed by laboratory tests.    Testosterone is FDA-approved as replacement therapy only for men who have low testosterone levels due to disorders of the testicles, pituitary gland, or brain that cause a condition called hypogonadism. Examples of these disorders include failure of the testicles to produce testosterone because of genetic problems, or damage from chemotherapy or infection. However, FDA has become aware that testosterone is being used extensively in attempts to relieve symptoms in men who have low testosterone for no apparent reason other than aging. The benefits and safety of this use have not been established.    In addition, based on the available evidence from published studies and expert input from an Advisory Committee meeting, FDA has concluded that there is a possible increased cardiovascular risk associated with testosterone use. These studies included aging men treated with testosterone. Some studies reported an increased risk of heart attack, stroke, or death associated with testosterone treatment, while others did not.    Based on our findings, we are requiring labeling changes for all prescription testosterone products to reflect the possible increased risk of heart attacks and strokes associated with testosterone use. Health care professionals should make patients aware of this possible risk when deciding whether to start or continue a patient on testosterone therapy. We are also requiring manufacturers of approved testosterone products to conduct a well-designed clinical trial to more clearly address the question of whether an increased risk of heart attack or stroke exists among users of these products. We are encouraging these manufacturers to work together on a clinical trial, but they are allowed to work separately if they so choose.    Patients using testosterone should seek medical attention immediately if symptoms of a heart attack or stroke are present, such as:    Chest pain  Shortness of breath or trouble breathing  Weakness in one part or one side of the body  Slurred speech

## 2022-03-04 NOTE — Unmapped (Signed)
I saw and evaluated the patient, participating in the key portions of the service.  I reviewed the resident’s note.  I agree with the resident’s findings and plan. Shadee Rathod R Torben Soloway, MD

## 2022-03-05 NOTE — Unmapped (Signed)
Last ordered: 3 weeks ago (02/07/2022) by Marda Stalker, MD     Last refill: 02/07/2022    Rx #: [id number]

## 2022-03-24 DIAGNOSIS — F419 Anxiety disorder, unspecified: Principal | ICD-10-CM

## 2022-03-24 MED ORDER — HYDROXYZINE HCL 25 MG TABLET
ORAL_TABLET | Freq: Three times a day (TID) | ORAL | 0 refills | 30 days | PRN
Start: 2022-03-24 — End: ?

## 2022-03-24 MED ORDER — DRONABINOL 10 MG CAPSULE
ORAL_CAPSULE | Freq: Two times a day (BID) | ORAL | 0 refills | 30 days
Start: 2022-03-24 — End: ?

## 2022-03-25 MED ORDER — DRONABINOL 10 MG CAPSULE
ORAL_CAPSULE | Freq: Two times a day (BID) | ORAL | 0 refills | 30 days | Status: CP
Start: 2022-03-25 — End: ?

## 2022-03-25 MED ORDER — HYDROXYZINE HCL 25 MG TABLET
ORAL_TABLET | Freq: Three times a day (TID) | ORAL | 3 refills | 30 days | Status: CP | PRN
Start: 2022-03-25 — End: 2023-03-26

## 2022-03-26 MED ORDER — PROMETHAZINE 25 MG TABLET
ORAL_TABLET | Freq: Four times a day (QID) | ORAL | 11 refills | 6 days | Status: CP | PRN
Start: 2022-03-26 — End: ?

## 2022-03-27 ENCOUNTER — Ambulatory Visit: Payer: Medicare Other | Attending: Anesthesiology | Admitting: Anesthesiology

## 2022-03-27 ENCOUNTER — Encounter: Payer: Self-pay | Admitting: Anesthesiology

## 2022-03-27 DIAGNOSIS — M47816 Spondylosis without myelopathy or radiculopathy, lumbar region: Secondary | ICD-10-CM

## 2022-03-27 DIAGNOSIS — M549 Dorsalgia, unspecified: Secondary | ICD-10-CM

## 2022-03-27 DIAGNOSIS — F1190 Opioid use, unspecified, uncomplicated: Secondary | ICD-10-CM | POA: Diagnosis not present

## 2022-03-27 DIAGNOSIS — M5412 Radiculopathy, cervical region: Secondary | ICD-10-CM

## 2022-03-27 DIAGNOSIS — G8929 Other chronic pain: Secondary | ICD-10-CM

## 2022-03-27 DIAGNOSIS — K5090 Crohn's disease, unspecified, without complications: Secondary | ICD-10-CM | POA: Diagnosis not present

## 2022-03-27 DIAGNOSIS — G894 Chronic pain syndrome: Secondary | ICD-10-CM

## 2022-03-27 DIAGNOSIS — M542 Cervicalgia: Secondary | ICD-10-CM

## 2022-03-27 MED ORDER — OXYCODONE-ACETAMINOPHEN 10-325 MG PO TABS: 1.0000 | ORAL_TABLET | Freq: Four times a day (QID) | ORAL | 0 refills | Status: AC | PRN

## 2022-03-27 MED ORDER — OXYCODONE-ACETAMINOPHEN 10-325 MG PO TABS: 1.0000 | ORAL_TABLET | Freq: Four times a day (QID) | ORAL | 0 refills | Status: DC | PRN

## 2022-03-27 NOTE — Progress Notes (Signed)
Virtual Visit via Telephone Note  I connected with Benjamin Murillo on 03/27/22 at 11:00 AM EST by telephone and verified that I am speaking with the correct person using two identifiers.  Location: Patient: Home Provider: Pain control center   I discussed the limitations, risks, security and privacy concerns of performing an evaluation and management service by telephone and the availability of in person appointments. I also discussed with the patient that there may be a patient responsible charge related to this service. The patient expressed understanding and agreed to proceed.   History of Present Illness: Is able to speak to [name] via telephone as we were unable lengthen the video portion of This.  He reports that his low back pain and neck pain and abdominal pain from Crohn's are stable in nature.  The Crohn's has settled down since our conversation several months ago.  He still having some neck pain currently under evaluation by his orthopedist he reports.  The quality characteristic and distribution of these pains has been stable with no other changes noted.  No change in upper extremity or lower extremity strength function or bowel or bladder function is reported today.  He is taking his medications about every 6 hours 2 to 3 tablets/day and this medication is continue to keep his pain under good control.  He takes his dronabinol additionally and has been able to keep the pain modified to the point where he staying active and functional.  No side effects reported.  Review of systems: General: No fevers or chills Pulmonary: No shortness of breath or dyspnea Cardiac: No angina or palpitations or lightheadedness GI: No abdominal pain or constipation Psych: No depression    Observations/Objective:  Current Outpatient Medications:    [START ON 04/30/2022] oxyCODONE-acetaminophen (PERCOCET) 10-325 MG tablet, Take 1 tablet by mouth every 6 (six) hours as needed for pain., Disp: 75  tablet, Rfl: 0   amlodipine-atorvastatin (CADUET) 10-10 MG tablet, Take 1 tablet by mouth daily., Disp: , Rfl:    citalopram (CELEXA) 10 MG tablet, Take 10 mg by mouth daily as needed., Disp: , Rfl:    dronabinol (MARINOL) 10 MG capsule, Take 10 mg by mouth 2 (two) times daily before a meal., Disp: , Rfl:    erythromycin ophthalmic ointment, Place 1 Application into the left eye at bedtime., Disp: 3.5 g, Rfl: 0   famotidine (PEPCID) 20 MG tablet, Take 20 mg by mouth 2 (two) times daily., Disp: , Rfl:    gabapentin (NEURONTIN) 100 MG capsule, Take 1 capsule (100 mg total) by mouth at bedtime., Disp: 30 capsule, Rfl: 3   gabapentin (NEURONTIN) 300 MG capsule, Take 1 capsule (300 mg total) by mouth 3 (three) times daily., Disp: 90 capsule, Rfl: 3   hyoscyamine (LEVSIN, ANASPAZ) 0.125 MG tablet, Take 0.125 mg by mouth as needed. , Disp: , Rfl:    loperamide (IMODIUM A-D) 2 MG tablet, Take 2 mg by mouth as needed., Disp: , Rfl:    naloxone (NARCAN) nasal spray 4 mg/0.1 mL, For respiratory depression from opioids, Disp: 1 kit, Rfl: 2   [START ON 03/31/2022] oxyCODONE-acetaminophen (PERCOCET) 10-325 MG tablet, Take 1 tablet by mouth every 6 (six) hours as needed for pain., Disp: 75 tablet, Rfl: 0   predniSONE (STERAPRED UNI-PAK 21 TAB) 10 MG (21) TBPK tablet, 6 tablets day one and day two, five tablets day three and day four, and daily taper over 12days as directed, Disp: 42 tablet, Rfl: 0   prochlorperazine (COMPAZINE) 5  MG tablet, Take 5 mg by mouth every 8 (eight) hours as needed., Disp: , Rfl:    STELARA 90 MG/ML SOSY injection, Inject 90 mg as directed., Disp: , Rfl:    Past Medical History:  Diagnosis Date   Cervical radiculitis 12/22/2019   Cervical radiculopathy    Chronic abdominal pain    Chronic neck pain    Cold    Crohn disease (HCC)    GERD (gastroesophageal reflux disease)    no meds-2-3 times weekly   Hand fracture, left    Headache(784.0)    Hypertension    no meds    IBS  (irritable bowel syndrome)    Shortness of breath    "sometimes laying down; sometimes w/activity" (10/13/2012)     Assessment and Plan:  1. Cervicalgia   2. Crohn's disease without complication, unspecified gastrointestinal tract location ([place])   3. Chronic pain syndrome   4. Chronic, continuous use of opioids   5. Chronic abdominal pain   6. Facet arthritis of lumbar region   7. Chronic neck and back pain   8. Cervical radiculitis   Based on our conversation today I think is appropriate to refill his medicine for the next 2 months.  He is scheduled for urine drug screen in the next few weeks and we will make no other changes in his pharmacologic regimen.  I have not encouraged him to continue with stretching strengthening exercises as reviewed and continue follow-up with his GI doctors for his Crohn's disease and continue follow-up with his orthopedic doctors for his neck evaluation.  I have reviewed the [facility] practitioner database information and it is appropriate for refill dated for March 3 and April 2 with return to clinic in 2 months. Follow Up Instructions:    I discussed the assessment and treatment plan with the patient. The patient was provided an opportunity to ask questions and all were answered. The patient agreed with the plan and demonstrated an understanding of the instructions.   The patient was advised to call back or seek an in-person evaluation if the symptoms worsen or if the condition fails to improve as anticipated.  I provided 30 minutes of non-face-to-face time during this encounter.   [name], MD

## 2022-03-31 DIAGNOSIS — F419 Anxiety disorder, unspecified: Principal | ICD-10-CM

## 2022-03-31 MED ORDER — CITALOPRAM 10 MG TABLET
ORAL_TABLET | Freq: Every day | ORAL | 11 refills | 30 days
Start: 2022-03-31 — End: 2023-03-31

## 2022-04-01 DIAGNOSIS — F419 Anxiety disorder, unspecified: Principal | ICD-10-CM

## 2022-04-01 MED ORDER — CITALOPRAM 10 MG TABLET
ORAL_TABLET | Freq: Every day | ORAL | 11 refills | 30 days
Start: 2022-04-01 — End: 2023-04-01

## 2022-04-01 MED ORDER — CITALOPRAM 20 MG TABLET
ORAL_TABLET | Freq: Every day | ORAL | 0 refills | 0 days
Start: 2022-04-01 — End: ?

## 2022-04-02 MED ORDER — CITALOPRAM 20 MG TABLET
ORAL_TABLET | Freq: Every day | ORAL | 0 refills | 30 days
Start: 2022-04-02 — End: ?

## 2022-04-08 DIAGNOSIS — F419 Anxiety disorder, unspecified: Principal | ICD-10-CM

## 2022-04-08 MED ORDER — HYDROXYZINE HCL 25 MG TABLET
ORAL_TABLET | Freq: Three times a day (TID) | ORAL | 3 refills | 30 days | PRN
Start: 2022-04-08 — End: 2023-04-09

## 2022-04-08 MED ORDER — DRONABINOL 10 MG CAPSULE
ORAL_CAPSULE | Freq: Two times a day (BID) | ORAL | 0 refills | 30 days
Start: 2022-04-08 — End: ?

## 2022-04-08 MED ORDER — PROMETHAZINE 25 MG TABLET
ORAL_TABLET | Freq: Four times a day (QID) | ORAL | 11 refills | 6 days | PRN
Start: 2022-04-08 — End: ?

## 2022-04-09 MED ORDER — HYDROXYZINE HCL 25 MG TABLET
ORAL_TABLET | Freq: Three times a day (TID) | ORAL | 3 refills | 30 days | PRN
Start: 2022-04-09 — End: 2023-04-10

## 2022-04-09 MED ORDER — PROMETHAZINE 25 MG TABLET
ORAL_TABLET | Freq: Four times a day (QID) | ORAL | 11 refills | 6 days | Status: CP | PRN
Start: 2022-04-09 — End: ?

## 2022-04-09 MED ORDER — DRONABINOL 10 MG CAPSULE
ORAL_CAPSULE | Freq: Two times a day (BID) | ORAL | 0 refills | 30 days | Status: CP
Start: 2022-04-09 — End: ?

## 2022-04-09 NOTE — Unmapped (Signed)
[facility]   Psychiatry Consultation Clinic   Initial Outpatient Consultation      Identifying Information:  Date: April 09, 2022  Name: Rodney Bender  Age: 52 y.o.  Sex: male  MRN: [id number]  DOB: [birth year]  Race: Black/African American  Ethnicity: Not Hispanic, Latino/a, or Spanish origin  PCP: Corky Mull, MD    Assessment:   This patient is being seen in consultation at the request of Beatriz Stallion, Vinnie Langton, MD of [facility] for evaluation of anxiety.        DSM Diagnoses:   Generalized anxiety disorder    Plan:  Medication recommendations: recommend that Mr. Billey establish with psychiatry given difficulty engaging with me during interview. I think he will do best with a long-term relationship with a provider so he feels comfortable sharing symptoms and needs. Otherwise, if he has a strong relationship with PCP it is reasonable to manage him in that setting- he seemed to do best with yes/no questions.  - Increase citalopram to 40 mg. If he notices benefit, can increase to 60 mg after 4-6 weeks. If you increase above 40 mg, would check EKG to evaluate for QT prolongation. If he does not notice benefit from increase to 40 mg, would not further increase dose.   - If no benefit from increasing citalopram to 40 mg, could then increase olanzapine to 5 mg to see if this helps with sleep and anxiety  - Continue buspirone 30 mg TID  - Can consider use of propranolol 10 mg TID PRN (and titrate) to target physical symptoms of anxiety  - Given polypharmacy, would focus on optimizing medications he's currently using. Consider stopping hydroxyzine, using 2.5 mg olanzapine PRN for nausea instead of phenergan    Psychotherapy recommendations: CBT to help further elucidate components of anxiety and help with challenging thought patterns    Psychosocial interventions: encourage more time outside and time with friends    Additional workup: Given history of Crohn's disease, recommend routinely evaluating absorption of vitamins D, B12, folate, thiamine    Safety assessment: No acute or imminent dangerousness is present.  Chronic risk of self-harm is elevated related to current diagnosis of depression, chronic severe medical condition, chronic mental illness > 5 years, and past diagnosis of depression.  These risks are mitigated by lack of active SI/HI, no know access to weapons or firearms, no history of previous suicide attempts , no history of violence, sense of responsibility to family and social supports, presence of a significant relationship, presence of an available support system, and safe housing.  While future psychiatric events cannot be accurately predicted, the patient does not currently require acute inpatient psychiatric care and does not currently meet [facility] involuntary commitment criteria.      Follow-up: PRN    Thank you for the opportunity to participate in the care of [facility]. Please contact me with further questions or concerns.    Chief Complaint:  ???I don't know.???    Visit Number: 1    HPI:   The patient is a 52 y.o. Black/African American male with a documented history of anxiety who I was asked by Corky Mull, MD to see in consultation for evaluation and recommendations regarding anxiety.    History of Present Illness  I guess my anxiety has been up pretty high. Feels like he has way too much energy, sometimes day. Not sleeping. Not increased goal directed behaviors but behaving not like himself, will walk a  lot when he has a lot of anxiety. Hopes it'll decrease anxiety. Heart will pound, can be hard to breathe and swallow. Gets irritable. Racing thoughts. Doesn't need to have a trigger. No pattern. Wakes up at night with anxiety. No anxiety attacks.     Anxiety gets better with time, he has to wait until it gets better. No medicines help. Maybe falling asleep helps    Mood is fine. Describes himself as OK. No SI. No HI. No SIB.     Sleep is bad- hard to fall asleep and stay asleep. SOmetimes pain, sometimes anxiety. Bathroom.  Appetite: ate 2 meals yesterday. Appetite alright this past week. Has times when appetite is very poor.     No AVH. No mania. Energy OK. Feels most comfortable with GI providers but feels that he can trust his PCP and communicate with them.     Stressors:    Chronic illness, financial    ROS:   Constitutional: negative for fevers, sweats and weight loss. Recent weight gain  Respiratory: negative for cough, shortness of breath  Cardiovascular: negative for chest pain/pressure/discomfort, leg swelling, claudication, near-syncope or syncope  Gastrointestinal: positive for pain, diarrhea  Genitourinary: negative for urgency, burning, pain,  Neurological: negative for coordination problems, dizziness, gait problems, headaches, memory problems, tingling, tremors      Past, Family, and Social Histories:  Psychiatric History:   Prior psychiatric diagnoses: GAD  Medication trials/compliance: Celexa, buspirone, hydroxyzine, venlafaxine, desipramine, mirtazapine, trazodone, duloxetine  Psychiatric hospitalizations: No  Suicide attempts: No  Non-suicidal self-injury: No  Current psychiatrist:No  Current therapist: No  Previous providers: PCP  Family Psych History: None    PDMP:  03/31/2022 03/27/2022  1 Oxycodone-Acetaminophen 10-325 75.00 19 Ja Ada [id number] [facility] (4231) 0/0 59.21 MME Medicare [place]   03/25/2022 03/25/2022  1 Dronabinol 10 Mg Capsule 60.00 30 Ca Cot [id number] Wal (4231) 0/0  Medicare [employer]   03/04/2022 03/04/2022  1 Testosterone 1.62% Gel Pump 75.00 30 [place] [id number] Wal (4231) 0/5  Medicare [place]   03/02/2022 01/31/2022  1 Oxycodone-Acetaminophen 10-325 75.00 19 Ja Ada [id number] Wal (4231) 0/0 59.21 MME Medicare [place]   02/01/2022 02/01/2022  1 Dronabinol 10 Mg Capsule 60.00 30 Ca Cot [id number] Wal (4231) 0/0  Medicare [place]   01/31/2022 01/31/2022  1 Oxycodone-Acetaminophen 10-325 75.00 19 Ja Ada [id number] Wal (4231) 0/0 59.21 MME Medicare  Substance Use History:   Alcohol - none  Tobacco - none  Drugs - none  Caffeine - Rare, drinks fruit punch    Social History:  Living situation: lives with wife, no kids. Not working. Tries to stay busy- likes to watch TV.   Guardian/Payee: None  Relationship Status: In committed relationship   Children: None  Education: hasn't completed high school  Income/Employment/Disability: Disabled   Abuse/Neglect/Trauma: Denies  Futures trader: None  Access to Firearms: None     Medical History:  Past Medical History:   Diagnosis Date    Acid reflux     Anxiety     Crohn's disease (CMS-HCC)     diagnosed in 1990    GERD (gastroesophageal reflux disease)     Hypertension 10/08/2016       Surgical History:  Past Surgical History:   Procedure Laterality Date    COLON SURGERY      PR COLONOSCOPY FLX DX W/COLLJ SPEC WHEN PFRMD Left 11/10/2012    Procedure: COLONOSCOPY, FLEXIBLE, PROXIMAL TO SPLENIC FLEXURE; DIAGNOSTIC, W/WO COLLECTION SPECIMEN BY BRUSH OR WASH;  Surgeon: Malcolm Metro,  MD;  Location: [facility]URES [facility] [place];  Service: Gastroenterology    PR COLONOSCOPY FLX DX W/COLLJ SPEC WHEN PFRMD N/A 07/21/2013    Procedure: COLONOSCOPY, FLEXIBLE, PROXIMAL TO SPLENIC FLEXURE; DIAGNOSTIC, W/WO COLLECTION SPECIMEN BY BRUSH OR WASH;  Surgeon: Gwen Pounds, MD;  Location: [facility]URES [facility];  Service: Gastroenterology    PR COLONOSCOPY FLX DX W/COLLJ SPEC WHEN PFRMD N/A 08/09/2016    Procedure: COLONOSCOPY, FLEXIBLE, PROXIMAL TO SPLENIC FLEXURE; DIAGNOSTIC, W/WO COLLECTION SPECIMEN BY BRUSH OR WASH;  Surgeon: Janyth Pupa, MD;  [facility]: [facility];  Service: Gastroenterology    PR COLONOSCOPY FLX DX W/COLLJ SPEC WHEN PFRMD N/A 04/06/2018    Procedure: COLONOSCOPY, FLEXIBLE, PROXIMAL TO SPLENIC FLEXURE; DIAGNOSTIC, W/WO COLLECTION SPECIMEN BY BRUSH OR WASH;  Surgeon: Zetta Bills, MD;  [facility];  Service: Gastroenterology    PR COLONOSCOPY FLX DX W/COLLJ SPEC WHEN PFRMD N/A 10/18/2021    Procedure: COLONOSCOPY, FLEXIBLE, PROXIMAL TO SPLENIC FLEXURE; DIAGNOSTIC, W/WO COLLECTION SPECIMEN BY BRUSH OR WASH;  Surgeon: Hunt Oris, MD;  [facility]: [facility]URES [facility];  Service: Gastroenterology    PR COLONOSCOPY W/BIOPSY SINGLE/MULTIPLE  07/23/2012    Procedure: COLONOSCOPY, FLEXIBLE, PROXIMAL TO SPLENIC FLEXURE; WITH BIOPSY, SINGLE OR MULTIPLE;  Surgeon: Vickii Chafe, MD;  Location: [facility]URES [facility];  Service: Gastroenterology    PR COLONOSCOPY W/BIOPSY SINGLE/MULTIPLE N/A 07/15/2014    Procedure: COLONOSCOPY, FLEXIBLE, PROXIMAL TO SPLENIC FLEXURE; WITH BIOPSY, SINGLE OR MULTIPLE;  Surgeon: Janyth Pupa, MD;  Location: [facility]URES [facility];  Service: Gastroenterology    PR COLONOSCOPY W/BIOPSY SINGLE/MULTIPLE N/A 03/21/2017    Procedure: COLONOSCOPY, FLEXIBLE, PROXIMAL TO SPLENIC FLEXURE; WITH BIOPSY, SINGLE OR MULTIPLE;  Surgeon: Modena Nunnery, MD;  [facility];  Service: Gastroenterology    PR COLONOSCOPY W/BIOPSY SINGLE/MULTIPLE N/A 02/12/2019    Procedure: COLONOSCOPY, FLEXIBLE, PROXIMAL TO SPLENIC FLEXURE; WITH BIOPSY, SINGLE OR MULTIPLE;  Surgeon: Jules Husbands, MD;  Location: [facility]URES [facility];  Service: Gastroenterology    PR COLONOSCOPY W/BIOPSY SINGLE/MULTIPLE Left 05/15/2020    Procedure: COLONOSCOPY, FLEXIBLE, PROXIMAL TO SPLENIC FLEXURE; WITH BIOPSY, SINGLE OR MULTIPLE;  Surgeon: Rona Ravens, MD;  [facility];  Service: Gastroenterology    PR COLSC FLEXIBLE W/TRANSENDOSCOPIC BALLOON DILAT N/A 07/15/2014    Procedure: COLONOSCOPY, FLEXIBLE; WITH DILATION BY BALLOON, 1 OR MORE STRICTURES;  Surgeon: Janyth Pupa, MD;  [facility]: [facility];  Service: Gastroenterology    PR REMVL COLON & TERM ILEUM W/ILEOCOLOSTOMY N/A 09/16/2016    Procedure: COLECTOMY, PARTIAL, WITH REMOVAL OF TERMINAL ILEUM WITH ILEOCOLOSTOMY;  Surgeon: Lady Gary, MD;  Location: MAIN OR [facility];  Service: Gastrointestinal    PR REPAIR BICEPS LONG TENDON Right 03/19/2019    Procedure: TENODESIS LONG TENDON BICEPS;  Surgeon: Gonzella Lex, MD;  Location: ASC OR [facility];  Service: Orthopedics    PR SHLDR ARTHROSCOP,PART ACROMIOPLAS Right 10/02/2018    Procedure: R16 ARTHROSCOPY, SHOULDER, SURGICAL; DECOMPRESS SUBACROMIAL SPACE W/PART ACROMIOPLASTY, Tamala Bari;  Surgeon: Gonzella Lex, MD;  Location: ASC [facility];  Service: Orthopedics    PR SHLDR ARTHROSCOP,SURG,W/ROTAT CUFF REPR Right 10/02/2018    Procedure: ARTHROSCOPY, SHOULDER, SURGICAL; WITH ROTATOR CUFF REPAIR;  Surgeon: Gonzella Lex, MD;  Location: ASC OR [facility];  Service: Orthopedics    PR SHLDR ARTHROSCOP,SURG,W/ROTAT CUFF REPR Right 03/19/2019    Procedure: R16 ARTHROSCOPY, SHOULDER, SURGICAL; WITH ROTATOR CUFF REPAIR;  Surgeon: Gonzella Lex, MD;  Location: ASC OR [facility];  Service: Orthopedics    PR UPPER GI ENDOSCOPY,BIOPSY N/A 07/23/2012    Procedure: UGI ENDOSCOPY; WITH BIOPSY, SINGLE OR MULTIPLE;  Surgeon: Vickii Chafe, MD;  [facility];  Service: Gastroenterology    PR UPPER GI ENDOSCOPY,DIAGNOSIS N/A 11/10/2012    Procedure: UGI ENDO, INCLUDE ESOPHAGUS, STOMACH, & DUODENUM &/OR JEJUNUM; DX W/WO COLLECTION SPECIMN, BY BRUSH OR WASH;  Surgeon: Malcolm Metro, MD;  Location: GI [facility]URES [facility];  Service: Gastroenterology    PR UPPER GI ENDOSCOPY,DIAGNOSIS N/A 07/21/2013    Procedure: UGI ENDO, INCLUDE ESOPHAGUS, STOMACH, & DUODENUM &/OR JEJUNUM; DX W/WO COLLECTION SPECIMN, BY BRUSH OR WASH;  Surgeon: Gwen Pounds, MD;  Location: [facility]URES [facility];  Service: Gastroenterology    PR UPPER GI ENDOSCOPY,DIAGNOSIS N/A 07/15/2014    Procedure: UGI ENDO, INCLUDE ESOPHAGUS, STOMACH, & DUODENUM &/OR JEJUNUM; DX W/WO COLLECTION SPECIMN, BY BRUSH OR WASH;  Surgeon: Janyth Pupa, MD;  Location: GI [facility]URES [facility];  Service: Gastroenterology    PR UPPER GI ENDOSCOPY,DIAGNOSIS N/A 10/18/2021    Procedure: UGI ENDO, INCLUDE ESOPHAGUS, STOMACH, & DUODENUM &/OR JEJUNUM; DX W/WO COLLECTION SPECIMN, BY BRUSH OR WASH;  Surgeon: Hunt Oris, MD;  Location: GI [facility]URES [facility];  Service: Gastroenterology    ROTATOR CUFF REPAIR Right 2020    SHOULDER SURGERY      SPINE SURGERY         Medications:   Current Outpatient Medications on File Prior to Visit   Medication Sig Dispense Refill    busPIRone (BUSPAR) 10 MG tablet TAKE 2 TABLETS BY MOUTH THREE TIMES DAILY 180 tablet 0    citalopram (CELEXA) 10 MG tablet Take 3 tablets (30 mg total) by mouth daily. 90 tablet 11    dronabinol (MARINOL) 10 MG capsule Take 1 capsule (10 mg total) by mouth Two (2) times a day (30 minutes before a meal). 60 capsule 0    ferrous sulfate 325 (65 FE) MG tablet Take 1 tablet (325 mg total) by mouth every other day. 60 tablet 0    gabapentin (NEURONTIN) 300 MG capsule Take 2 capsules (600 mg total) by mouth Three (3) times a day. 180 capsule 11    hydrOXYzine (ATARAX) 25 MG tablet Take 1 tablet (25 mg total) by mouth Three (3) times a day as needed for anxiety. 90 tablet 3    loperamide (IMODIUM A-D) 2 mg tablet Take 1 tablet (2 mg total) by mouth daily as needed.      naloxone (NARCAN) 4 mg nasal spray       OLANZapine (ZYPREXA) 2.5 MG tablet Take 1 tablet by mouth nightly 30 tablet 5    oxyCODONE-acetaminophen (PERCOCET) 10-325 mg per tablet Take 1 tablet by mouth every eight (8) hours as needed.      [EXPIRED] pantoprazole (PROTONIX) 40 MG tablet Take 1 tablet (40 mg total) by mouth daily as needed. 90 tablet 3    promethazine (PHENERGAN) 25 MG tablet Take 2 tablets (50 mg total) by mouth every six (6) hours as needed for nausea. 45 tablet 11    sildenafiL (VIAGRA) 100 MG tablet Take 1 tablet (100 mg total) by mouth daily as needed for erectile dysfunction. 90 tablet 3    testosterone 20.25 mg/1.25 gram (1.62 %) gel pump Place 2 sprays (40.5 mg total) on the skin daily. 75 g 5    therapeutic multivitamin (THERAGRAN) tablet Take 1 tablet by mouth daily. 90 tablet 3  ustekinumab (STELARA) 90 mg/mL Syrg syringe Inject the contents of 1 syringe (90 mg total) under the skin every 8 weeks. 1 mL 5     No current facility-administered medications on file prior to visit.       Allergies:   Tramadol, Penicillins, Dilaudid [hydromorphone], and Morphine    Family History:  See under Psychiatric History, above    Examination:   Constitutional: There were no vitals filed for this visit..   General: NAD, well-nourished  MSK/Neuro: Normal bulk and tone.  No abnormal movements.  No tremor.  Gait normal.  Mental Status Exam:  Appearance  Clean/neat, difficult to see due to backlit setting   Behavior  Lying in bed, does not adjust camera to make himself easier to see   Speech/Language:   paucity   Psychomotor:  limited eye contact and fidgety   Mood:  ???I'm fine.???   Affect:  guarded and irritable   Thought process:  concrete   Associations:  intact   Thought content:    denies thoughts of self-harm. Denies SI, plans, or intent. Denies HI.  No grandiose, self-referential, persecutory, or paranoid delusions noted.   Perceptual disturbances:   denies auditory and visual hallucinations and behavior not concerning for response to internal stimuli   Attention and Concentration:  able to fully attend without fluctuations in consciousness Able to fully concentrate and attend   Orientation:  Oriented to person, place, time, and general circumstances   Memory:  not formally tested, but grossly intact   Fund of knowledge:   Consistent with level of education and development   Insight:    Fair   Judgment:   Intact   Impulse Control:  Intact     Test Results:  Psychometrics:   PHQ =   PHQ-9 PHQ-9 TOTAL SCORE   04/20/2019  10:00 AM 8   03/31/2019   9:00 AM 10   02/22/2019   2:00 PM 8   12/30/2017   1:00 PM 7      GAD-7 =   GAD7 Total Score GAD-7 Total Score 03/01/2022   2:00 PM 15   01/04/2022   3:00 PM 19   04/20/2019  10:00 AM 7   03/31/2019   9:00 AM 13      EKG:   Encounter Date: 01/04/22   ECG 12 Lead   Result Value    EKG Systolic BP     EKG Diastolic BP     EKG Ventricular Rate 60    EKG Atrial Rate 60    EKG P-R Interval 142    EKG QRS Duration 88    EKG Q-T Interval 428    EKG QTC Calculation 428    EKG Calculated P Axis 64    EKG Calculated R Axis 73    EKG Calculated T Axis 63    QTC Fredericia 428    Narrative    NORMAL SINUS RHYTHM  MINIMAL VOLTAGE CRITERIA FOR LVH, MAY BE NORMAL VARIANT  EARLY REPOLARIZATION PATTERN  WHEN COMPARED WITH ECG OF 11-Feb-2019 07:43,  NO SIGNIFICANT CHANGE SINCE LAST TRACING  Confirmed by Freeman Caldron ([id number]) on 01/07/2022 9:10:08 AM     Labs:   TSH   Date Value Ref Range Status   01/04/2022 0.652 0.550 - 4.780 uIU/mL Final   01/15/2011 0.51 (L) 0.60 - 3.30 MICROIU/ML Final     Vitamin B-12   Date Value Ref Range Status   03/01/2022 384 211 - 911 pg/ml Final   07/30/2011 392  193 - 900 PG/ML Final     Folate   Date Value Ref Range Status   10/24/2010 8.0 2.7 - 20.0 NG/ML Final      Imaging: Reviewed, no head imaging    Carmelina Noun, MD  04/09/2022

## 2022-04-11 ENCOUNTER — Telehealth: Admit: 2022-04-11 | Discharge: 2022-04-12 | Payer: MEDICARE | Attending: Psychiatry | Primary: Psychiatry

## 2022-04-11 DIAGNOSIS — F419 Anxiety disorder, unspecified: Principal | ICD-10-CM

## 2022-04-12 MED ORDER — OLANZAPINE 2.5 MG TABLET
ORAL_TABLET | Freq: Every evening | ORAL | 0 refills | 0 days
Start: 2022-04-12 — End: ?

## 2022-04-15 DIAGNOSIS — F419 Anxiety disorder, unspecified: Principal | ICD-10-CM

## 2022-04-15 MED ORDER — OLANZAPINE 2.5 MG TABLET
ORAL_TABLET | Freq: Every evening | ORAL | 0 refills | 30 days | Status: CP
Start: 2022-04-15 — End: ?

## 2022-04-15 MED ORDER — CITALOPRAM 40 MG TABLET
ORAL_TABLET | Freq: Every day | ORAL | 11 refills | 30 days | Status: CP
Start: 2022-04-15 — End: 2023-04-15

## 2022-04-16 NOTE — Unmapped (Signed)
Called patient to review Psychiatry consultation recommendations. He was agreeable to making one modification at a time. We will start by increasing Celexa to 40 mg daily at this time. We have a follow up appointment scheduled in April and will re-evaluate how he is doing at that time. Can consider increasing Citalopram further if needed. Can also consider discontinuing hydroxyzine and switching to propranolol prn to help with physical symptoms of anxiety. Patient would ultimately benefit from establishing with a Psychiatrist long-term, but unfortunately has had a hard time finding someone to establish with.     Ludwig Lean, MD  PGY-1, [facility] Internal Medicine

## 2022-04-20 MED ORDER — PROMETHAZINE 25 MG TABLET
ORAL_TABLET | Freq: Four times a day (QID) | ORAL | 11 refills | 6 days | PRN
Start: 2022-04-20 — End: ?

## 2022-04-22 MED ORDER — PROMETHAZINE 25 MG TABLET
ORAL_TABLET | Freq: Four times a day (QID) | ORAL | 11 refills | 6 days | Status: CP | PRN
Start: 2022-04-22 — End: ?

## 2022-04-23 NOTE — Unmapped (Signed)
[facility] Refill Coordination Note    Specialty Medication(s) to be Shipped:   Inflammatory Disorders: Stelara    Other medication(s) to be shipped: No additional medications requested for fill at this time     Rodney Bender, DOB: 11-Jul-1970  Phone: [phone] (home)       All above HIPAA information was verified with patient.     Was a Nurse, learning disability used for this call? No    Completed refill call assessment today to schedule patient's medication shipment from the [facility] [phone]).  All relevant notes have been reviewed.     Specialty medication(s) and dose(s) confirmed: Regimen is correct and unchanged.   Changes to medications: [name] reports no changes at this time.  Changes to insurance: No  New side effects reported not previously addressed with a pharmacist or physician: None reported  Questions for the pharmacist: No    Confirmed patient received a Conservation officer, historic buildings and a Surveyor, mining with first shipment. The patient will receive a drug information handout for each medication shipped and additional FDA Medication Guides as required.       DISEASE/MEDICATION-SPECIFIC INFORMATION        For patients on injectable medications: Patient currently has 0 doses left.  Next injection is scheduled for 4/6.    SPECIALTY MEDICATION ADHERENCE     Medication Adherence    Patient reported X missed doses in the last month: 0  Specialty Medication: STELARA 90 mg/mL  Patient is on additional specialty medications: No              Were doses missed due to medication being on hold? No    Stelara 90 mg/ml: 0 days of medicine on hand        REFERRAL TO PHARMACIST     Referral to the pharmacist: Not needed      [facility]     Shipping address confirmed in Epic.     Patient was notified of new phone menu : No    Delivery Scheduled: Yes, Expected medication delivery date: 05/01/22.     Medication will be delivered via Same Day Courier to the prescription address in Epic WAM.    [name]   [facility] Specialty Technician

## 2022-05-01 MED FILL — STELARA 90 MG/ML SUBCUTANEOUS SYRINGE: SUBCUTANEOUS | 56 days supply | Qty: 1 | Fill #1

## 2022-05-07 LAB — TOXASSURE SELECT 13 (MW), URINE

## 2022-05-08 MED ORDER — DRONABINOL 10 MG CAPSULE
ORAL_CAPSULE | Freq: Two times a day (BID) | ORAL | 0 refills | 30 days | Status: CP
Start: 2022-05-08 — End: ?

## 2022-05-09 DIAGNOSIS — D509 Iron deficiency anemia, unspecified: Principal | ICD-10-CM

## 2022-05-09 MED ORDER — FERROUS SULFATE 325 MG (65 MG IRON) TABLET
ORAL_TABLET | ORAL | 0 refills | 0 days
Start: 2022-05-09 — End: ?

## 2022-05-12 MED ORDER — FERROUS SULFATE 325 MG (65 MG IRON) TABLET
ORAL_TABLET | ORAL | 0 refills | 0 days | Status: CP
Start: 2022-05-12 — End: ?

## 2022-05-15 MED ORDER — OLANZAPINE 2.5 MG TABLET
ORAL_TABLET | Freq: Every evening | ORAL | 0 refills | 0 days
Start: 2022-05-15 — End: ?

## 2022-05-16 MED ORDER — OLANZAPINE 2.5 MG TABLET
ORAL_TABLET | Freq: Every evening | ORAL | 0 refills | 30 days | Status: CP
Start: 2022-05-16 — End: ?

## 2022-05-27 ENCOUNTER — Ambulatory Visit: Admit: 2022-05-27 | Payer: MEDICARE

## 2022-05-27 DIAGNOSIS — F419 Anxiety disorder, unspecified: Principal | ICD-10-CM

## 2022-05-27 MED ORDER — CITALOPRAM 20 MG TABLET
ORAL_TABLET | Freq: Every day | ORAL | 0 refills | 0 days
Start: 2022-05-27 — End: ?

## 2022-05-28 ENCOUNTER — Encounter: Payer: Self-pay | Admitting: Anesthesiology

## 2022-05-28 ENCOUNTER — Ambulatory Visit: Payer: Medicare Other | Attending: Anesthesiology | Admitting: Anesthesiology

## 2022-05-28 DIAGNOSIS — F1190 Opioid use, unspecified, uncomplicated: Secondary | ICD-10-CM

## 2022-05-28 DIAGNOSIS — G894 Chronic pain syndrome: Secondary | ICD-10-CM

## 2022-05-28 DIAGNOSIS — M549 Dorsalgia, unspecified: Secondary | ICD-10-CM

## 2022-05-28 DIAGNOSIS — K5090 Crohn's disease, unspecified, without complications: Secondary | ICD-10-CM

## 2022-05-28 DIAGNOSIS — G8929 Other chronic pain: Secondary | ICD-10-CM

## 2022-05-28 DIAGNOSIS — R109 Unspecified abdominal pain: Secondary | ICD-10-CM

## 2022-05-28 DIAGNOSIS — M542 Cervicalgia: Secondary | ICD-10-CM | POA: Diagnosis not present

## 2022-05-28 DIAGNOSIS — M5412 Radiculopathy, cervical region: Secondary | ICD-10-CM

## 2022-05-28 DIAGNOSIS — M47816 Spondylosis without myelopathy or radiculopathy, lumbar region: Secondary | ICD-10-CM

## 2022-05-28 MED ORDER — OXYCODONE-ACETAMINOPHEN 10-325 MG PO TABS: 1.0000 | ORAL_TABLET | Freq: Four times a day (QID) | ORAL | 0 refills | Status: AC | PRN

## 2022-05-28 MED ORDER — OXYCODONE-ACETAMINOPHEN 10-325 MG PO TABS: 1.0000 | ORAL_TABLET | Freq: Four times a day (QID) | ORAL | 0 refills | Status: DC | PRN

## 2022-05-28 NOTE — Progress Notes (Signed)
Virtual Visit via Telephone Note  I connected with Benjamin Murillo on 05/28/22 at 11:20 AM EDT by telephone and verified that I am speaking with the correct person using two identifiers.  Location: Patient: Home Provider: Pain control center   I discussed the limitations, risks, security and privacy concerns of performing an evaluation and management service by telephone and the availability of in person appointments. I also discussed with the patient that there may be a patient responsible charge related to this service. The patient expressed understanding and agreed to proceed.   History of Present Illness: I spoke with Benjamin Murillo via telephone as we were unable to link  for the video portion of the conference.  He reports that he has been doing reasonably well.  His abdominal pain has been stable in nature with no recent changes in the quality characteristic or distribution of the pain.  He still having some neck pain with occasional numbness affecting the right arm but mainly worse if he sleeps awkwardly.  This resolved spontaneously.  No complaints of upper extremity strength loss are noted.  He is still doing his core stretching strengthening exercises to help with his neck and back pain.  He takes his Percocet 10/31/2023 on average 3 times a day to help with the chronic abdominal pain.  He has been on this regimen for an extended period of time.  He also takes Marinol to help with spasming.  The combination of medications he is on at present revealed no side effects and continue to give him about 80 to 90% relief of his abdominal pain and intermittent neck pain when he takes the medicines as noted.  This last for about 4 to 6 hours.  Unfortunately he has failed more conservative therapy and feels that he cannot function to a reasonable standard without these medications.  Otherwise he is in his usual state of health.  He is due to see his GI doctors here soon.  Review of systems: General: No  fevers or chills Pulmonary: No shortness of breath or dyspnea Cardiac: No angina or palpitations or lightheadedness GI: No abdominal pain or constipation Psych: No depression    Observations/Objective:  Current Outpatient Medications:    [START ON 06/30/2022] oxyCODONE-acetaminophen (PERCOCET) 10-325 MG tablet, Take 1 tablet by mouth every 6 (six) hours as needed for pain., Disp: 75 tablet, Rfl: 0   amlodipine-atorvastatin (CADUET) 10-10 MG tablet, Take 1 tablet by mouth daily., Disp: , Rfl:    citalopram (CELEXA) 10 MG tablet, Take 10 mg by mouth daily as needed., Disp: , Rfl:    dronabinol (MARINOL) 10 MG capsule, Take 10 mg by mouth 2 (two) times daily before a meal., Disp: , Rfl:    erythromycin ophthalmic ointment, Place 1 Application into the left eye at bedtime., Disp: 3.5 g, Rfl: 0   famotidine (PEPCID) 20 MG tablet, Take 20 mg by mouth 2 (two) times daily., Disp: , Rfl:    gabapentin (NEURONTIN) 100 MG capsule, Take 1 capsule (100 mg total) by mouth at bedtime., Disp: 30 capsule, Rfl: 3   gabapentin (NEURONTIN) 300 MG capsule, Take 1 capsule (300 mg total) by mouth 3 (three) times daily., Disp: 90 capsule, Rfl: 3   hyoscyamine (LEVSIN, ANASPAZ) 0.125 MG tablet, Take 0.125 mg by mouth as needed. , Disp: , Rfl:    loperamide (IMODIUM A-D) 2 MG tablet, Take 2 mg by mouth as needed., Disp: , Rfl:    naloxone (NARCAN) nasal spray 4 mg/0.1 mL, For  respiratory depression from opioids, Disp: 1 kit, Rfl: 2   [START ON 05/31/2022] oxyCODONE-acetaminophen (PERCOCET) 10-325 MG tablet, Take 1 tablet by mouth every 6 (six) hours as needed for pain., Disp: 75 tablet, Rfl: 0   predniSONE (STERAPRED UNI-PAK 21 TAB) 10 MG (21) TBPK tablet, 6 tablets day one and day two, five tablets day three and day four, and daily taper over 12days as directed, Disp: 42 tablet, Rfl: 0   prochlorperazine (COMPAZINE) 5 MG tablet, Take 5 mg by mouth every 8 (eight) hours as needed., Disp: , Rfl:    STELARA 90 MG/ML SOSY  injection, Inject 90 mg as directed., Disp: , Rfl:     Past Medical History:  Diagnosis Date   Cervical radiculitis 12/22/2019   Cervical radiculopathy    Chronic abdominal pain    Chronic neck pain    Cold    Crohn disease (HCC)    GERD (gastroesophageal reflux disease)    no meds-2-3 times weekly   Hand fracture, left    Headache(784.0)    Hypertension    no meds    IBS (irritable bowel syndrome)    Shortness of breath    "sometimes laying down; sometimes w/activity" (10/13/2012)     Assessment and Plan: 1. Cervicalgia   2. Crohn's disease without complication, unspecified gastrointestinal tract location (HCC)   3. Chronic pain syndrome   4. Chronic, continuous use of opioids   5. Chronic abdominal pain   6. Facet arthritis of lumbar region   7. Chronic neck and back pain   8. Cervical radiculitis   Based on our conversation it is appropriate to renew his medicines for the next 2 months.  These will be dated for May 3 and June 2.  Continue with Percocet as instructed.  Continue with Marinol as per primary team and continue follow-up with his GI teams for baseline abdominal pain.  Continue follow-up with his primary care physicians for baseline medical care with return to clinic scheduled in 2 months.  I have reviewed his most recent urinary drug screen and it is appropriate under the circumstances.  Continue stretching strengthening exercises as requested with return to clinic as noted.  No other changes in his pharmacologic regimen will be initiated.  Follow Up Instructions:    I discussed the assessment and treatment plan with the patient. The patient was provided an opportunity to ask questions and all were answered. The patient agreed with the plan and demonstrated an understanding of the instructions.   The patient was advised to call back or seek an in-person evaluation if the symptoms worsen or if the condition fails to improve as anticipated.  I provided 30 minutes of  non-face-to-face time during this encounter.   Yevette Edwards, MD

## 2022-06-01 MED ORDER — CITALOPRAM 20 MG TABLET
ORAL_TABLET | Freq: Every day | ORAL | 0 refills | 30 days
Start: 2022-06-01 — End: ?

## 2022-06-02 NOTE — Unmapped (Signed)
Celexa already refilled for new dose, this dose has been discontinued

## 2022-06-05 DIAGNOSIS — F419 Anxiety disorder, unspecified: Principal | ICD-10-CM

## 2022-06-05 MED ORDER — HYDROXYZINE HCL 25 MG TABLET
ORAL_TABLET | Freq: Three times a day (TID) | ORAL | 3 refills | 30 days | PRN
Start: 2022-06-05 — End: 2023-06-06

## 2022-06-05 MED ORDER — PROMETHAZINE 25 MG TABLET
ORAL_TABLET | Freq: Four times a day (QID) | ORAL | 11 refills | 6 days | PRN
Start: 2022-06-05 — End: ?

## 2022-06-10 MED ORDER — PROMETHAZINE 25 MG TABLET
ORAL_TABLET | Freq: Four times a day (QID) | ORAL | 11 refills | 6 days | Status: CP | PRN
Start: 2022-06-10 — End: ?

## 2022-06-10 MED ORDER — HYDROXYZINE HCL 25 MG TABLET
ORAL_TABLET | Freq: Three times a day (TID) | ORAL | 3 refills | 30 days | Status: CP | PRN
Start: 2022-06-10 — End: 2023-06-11

## 2022-06-14 MED ORDER — OLANZAPINE 2.5 MG TABLET
ORAL_TABLET | Freq: Every evening | ORAL | 0 refills | 0 days
Start: 2022-06-14 — End: ?

## 2022-06-20 MED ORDER — OLANZAPINE 2.5 MG TABLET
ORAL_TABLET | Freq: Every evening | ORAL | 0 refills | 30 days | Status: CP
Start: 2022-06-20 — End: ?

## 2022-06-20 NOTE — Unmapped (Signed)
[facility] Refill Coordination Note    Specialty Medication(s) to be Shipped:   Inflammatory Disorders: Stelara    Other medication(s) to be shipped: No additional medications requested for fill at this time     Rodney Bender, DOB: [birth year]  Phone: [phone] (home)       All above HIPAA information was verified with patient.     Was a Nurse, learning disability used for this call? No    Completed refill call assessment today to schedule patient's medication shipment from the [facility] [phone]).  All relevant notes have been reviewed.     Specialty medication(s) and dose(s) confirmed: Regimen is correct and unchanged.   Changes to medications: Avi reports no changes at this time.  Changes to insurance: No  New side effects reported not previously addressed with a pharmacist or physician: None reported  Questions for the pharmacist: No    Confirmed patient received a Conservation officer, historic buildings and a Surveyor, mining with first shipment. The patient will receive a drug information handout for each medication shipped and additional FDA Medication Guides as required.       DISEASE/MEDICATION-SPECIFIC INFORMATION        For patients on injectable medications: Patient currently has 0 doses left.  Next injection is scheduled for 5/25.    SPECIALTY MEDICATION ADHERENCE     Medication Adherence    Patient reported X missed doses in the last month: 0  Specialty Medication: STELARA 90 mg/mL  Patient is on additional specialty medications: No              Were doses missed due to medication being on hold? No    Stelara 90 mg/ml: 0 days of medicine on hand        REFERRAL TO PHARMACIST     Referral to the pharmacist: Not needed      [facility]     Shipping address confirmed in Epic.       Delivery Scheduled: Yes, Expected medication delivery date: 06/21/22.     Medication will be delivered via Same Day Courier to the prescription address in Epic WAM.    [name]   [facility] Specialty Technician

## 2022-06-21 MED FILL — STELARA 90 MG/ML SUBCUTANEOUS SYRINGE: SUBCUTANEOUS | 56 days supply | Qty: 1 | Fill #2

## 2022-06-25 MED ORDER — DRONABINOL 10 MG CAPSULE
ORAL_CAPSULE | Freq: Two times a day (BID) | ORAL | 0 refills | 30 days | Status: CP
Start: 2022-06-25 — End: ?

## 2022-06-26 MED ORDER — DRONABINOL 10 MG CAPSULE
ORAL_CAPSULE | Freq: Two times a day (BID) | ORAL | 0 refills | 30 days
Start: 2022-06-26 — End: ?

## 2022-06-26 NOTE — Unmapped (Addendum)
06/26/22: Pt called stating that he's been unable to fill the Marinol.  There has been a backorder.  He reports the medication really helps him--less diarrhea and increased appetite.  He reports only 2 stools per day (less liquid output) and he's been able to gain weight.  Pt requests that Dr. Elizebeth Brooking send Marinol rx to a [facility] pharmacy where he can come and pick it up.    Called [facility] pharmacy to confirm he has not been able to get this-had not filled most recent prescription.  [facility] pharmacy closed. Will call tomorrow.      5/30: Called [facility] to determine if they have Marinol in stock.They do not have 60 tabs but could order. Shipping takes ~5 days.    Called [facility]-They do not have but can order and would arrive next day. Pt's preference is to drive in and get it.  They are placing hold for the med.  Sending pended refill request to Dr. Elizebeth Brooking to send to [facility].

## 2022-06-27 MED ORDER — DRONABINOL 10 MG CAPSULE
ORAL_CAPSULE | Freq: Two times a day (BID) | ORAL | 0 refills | 30.00000 days | Status: CP
Start: 2022-06-27 — End: 2022-06-27
  Filled 2022-06-28: qty 60, 30d supply, fill #0

## 2022-06-27 NOTE — Unmapped (Signed)
Called pt. Left voicemail stating his Marinol should be available at [facility] pharmacy tomorrow. Advised him to call them before he drives over to ensure it is ready for him. Left pharmacy number and address as well as my phone number for questions.

## 2022-06-28 NOTE — Unmapped (Signed)
Pt called stating he's called that pharmacy and they do not have Marinol.  Called [facility] and spoke to [place] about this being ordered for pt yesterday. Swati states order just came in a noon, she will check and package for pt. He should expect a text notification when it's ready. Called pt. Advised him of above and advised to call and speak to [facility] if he doesn't hear an update by 1:30pm.  Pt stated understanding.

## 2022-06-28 NOTE — Unmapped (Signed)
Duplicate

## 2022-06-28 NOTE — Unmapped (Signed)
Dronnabinol out of stock at his pharmacy. We arranged to sent to [facility] pharmacy, would get get rapid shipping.

## 2022-07-19 MED ORDER — OLANZAPINE 2.5 MG TABLET
ORAL_TABLET | Freq: Every evening | ORAL | 0 refills | 0 days
Start: 2022-07-19 — End: ?

## 2022-07-22 MED ORDER — OLANZAPINE 2.5 MG TABLET
ORAL_TABLET | Freq: Every evening | ORAL | 0 refills | 30 days | Status: CP
Start: 2022-07-22 — End: ?

## 2022-07-29 ENCOUNTER — Ambulatory Visit: Payer: Medicare Other | Attending: Anesthesiology | Admitting: Anesthesiology

## 2022-07-29 ENCOUNTER — Encounter: Payer: Self-pay | Admitting: Anesthesiology

## 2022-07-29 DIAGNOSIS — G8929 Other chronic pain: Secondary | ICD-10-CM

## 2022-07-29 DIAGNOSIS — M542 Cervicalgia: Secondary | ICD-10-CM

## 2022-07-29 DIAGNOSIS — M47816 Spondylosis without myelopathy or radiculopathy, lumbar region: Secondary | ICD-10-CM

## 2022-07-29 DIAGNOSIS — M549 Dorsalgia, unspecified: Secondary | ICD-10-CM

## 2022-07-29 DIAGNOSIS — M5412 Radiculopathy, cervical region: Secondary | ICD-10-CM

## 2022-07-29 DIAGNOSIS — K5090 Crohn's disease, unspecified, without complications: Secondary | ICD-10-CM

## 2022-07-29 DIAGNOSIS — G894 Chronic pain syndrome: Secondary | ICD-10-CM | POA: Diagnosis not present

## 2022-07-29 DIAGNOSIS — F1190 Opioid use, unspecified, uncomplicated: Secondary | ICD-10-CM

## 2022-07-29 DIAGNOSIS — R200 Anesthesia of skin: Secondary | ICD-10-CM

## 2022-07-29 DIAGNOSIS — R109 Unspecified abdominal pain: Secondary | ICD-10-CM

## 2022-07-29 MED ORDER — OXYCODONE-ACETAMINOPHEN 10-325 MG PO TABS: 1.0000 | ORAL_TABLET | Freq: Four times a day (QID) | ORAL | 0 refills | Status: AC | PRN

## 2022-07-29 MED ORDER — OXYCODONE-ACETAMINOPHEN 10-325 MG PO TABS: 1.0000 | ORAL_TABLET | Freq: Four times a day (QID) | ORAL | 0 refills | Status: DC | PRN

## 2022-07-29 NOTE — Progress Notes (Signed)
Virtual Visit via Telephone Note  I connected with [name] on 07/29/22 at  3:40 PM EDT by telephone and verified that I am speaking with the correct person using two identifiers.  Location: Patient: Home Provider: Pain control center   I discussed the limitations, risks, security and privacy concerns of performing an evaluation and management service by telephone and the availability of in person appointments. I also discussed with the patient that there may be a patient responsible charge related to this service. The patient expressed understanding and agreed to proceed.   History of Present Illness: I spoke with Benjamin Murillo via telephone as we are unable link for the video portion conference.  He is doing well with his current regimen taking his Percocet about 2-3 times a day on average.  He reports about 75% relief lasting 6 to 8 hours before he gets recurrence of his baseline pain.  The quality characteristic and distribution of this are stable in nature and with no recent changes noted.  He still has his normal abdominal pain and continues to follow with his GI doctors.  This has been stable.  He has some back and neck issues additionally but these have also been stable.  He is trying to do his stretching strengthening exercises staying active and continues to respond favorably to the medications without side effects.  Review of systems: General: No fevers or chills Pulmonary: No shortness of breath or dyspnea Cardiac: No angina or palpitations or lightheadedness GI: No abdominal pain or constipation Psych: No depression    Observations/Objective:  Current Outpatient Medications:    [START ON 08/28/2022] oxyCODONE-acetaminophen (PERCOCET) 10-325 MG tablet, Take 1 tablet by mouth every 6 (six) hours as needed for pain., Disp: 75 tablet, Rfl: 0   amlodipine-atorvastatin (CADUET) 10-10 MG tablet, Take 1 tablet by mouth daily., Disp: , Rfl:    citalopram (CELEXA) 10 MG tablet,  Take 10 mg by mouth daily as needed., Disp: , Rfl:    dronabinol (MARINOL) 10 MG capsule, Take 10 mg by mouth 2 (two) times daily before a meal., Disp: , Rfl:    erythromycin ophthalmic ointment, Place 1 Application into the left eye at bedtime., Disp: 3.5 g, Rfl: 0   famotidine (PEPCID) 20 MG tablet, Take 20 mg by mouth 2 (two) times daily., Disp: , Rfl:    gabapentin (NEURONTIN) 100 MG capsule, Take 1 capsule (100 mg total) by mouth at bedtime., Disp: 30 capsule, Rfl: 3   gabapentin (NEURONTIN) 300 MG capsule, Take 1 capsule (300 mg total) by mouth 3 (three) times daily., Disp: 90 capsule, Rfl: 3   hyoscyamine (LEVSIN, ANASPAZ) 0.125 MG tablet, Take 0.125 mg by mouth as needed. , Disp: , Rfl:    loperamide (IMODIUM A-D) 2 MG tablet, Take 2 mg by mouth as needed., Disp: , Rfl:    naloxone (NARCAN) nasal spray 4 mg/0.1 mL, For respiratory depression from opioids, Disp: 1 kit, Rfl: 2   oxyCODONE-acetaminophen (PERCOCET) 10-325 MG tablet, Take 1 tablet by mouth every 6 (six) hours as needed for pain., Disp: 75 tablet, Rfl: 0   predniSONE (STERAPRED UNI-PAK 21 TAB) 10 MG (21) TBPK tablet, 6 tablets day one and day two, five tablets day three and day four, and daily taper over 12days as directed, Disp: 42 tablet, Rfl: 0   prochlorperazine (COMPAZINE) 5 MG tablet, Take 5 mg by mouth every 8 (eight) hours as needed., Disp: , Rfl:    STELARA 90 MG/ML SOSY injection, Inject 90 mg  as directed., Disp: , Rfl:    Past Medical History:  Diagnosis Date   Cervical radiculitis 12/22/2019   Cervical radiculopathy    Chronic abdominal pain    Chronic neck pain    Cold    Crohn disease (HCC)    GERD (gastroesophageal reflux disease)    no meds-2-3 times weekly   Hand fracture, left    Headache(784.0)    Hypertension    no meds    IBS (irritable bowel syndrome)    Shortness of breath    "sometimes laying down; sometimes w/activity" (10/13/2012)     Assessment and Plan: 1. Cervicalgia   2. Crohn's  disease without complication, unspecified gastrointestinal tract location (HCC)   3. Chronic pain syndrome   4. Facet arthritis of lumbar region   5. Chronic abdominal pain   6. Chronic, continuous use of opioids   7. Chronic neck and back pain   8. Cervical radiculitis   9. Right arm numbness   Based on our conversation it is appropriate to refill his medicines.  He continues to respond favorably to chronic opioid therapy.  Refills will be initiated for July 1 and July 31.  No other changes in his pharmacologic regimen will be initiated.  He is responding favorably to the medications getting good relief without side effect and staying active and these are enabling him to do so.  Continue follow-up with his primary care physicians and GI doctors for routine medical care with return to clinic scheduled in 2 months.  Follow Up Instructions:    I discussed the assessment and treatment plan with the patient. The patient was provided an opportunity to ask questions and all were answered. The patient agreed with the plan and demonstrated an understanding of the instructions.   The patient was advised to call back or seek an in-person evaluation if the symptoms worsen or if the condition fails to improve as anticipated.  I provided 30 minutes of non-face-to-face time during this encounter.   Yevette Edwards, MD

## 2022-08-01 MED ORDER — DRONABINOL 10 MG CAPSULE
ORAL_CAPSULE | Freq: Two times a day (BID) | ORAL | 0 refills | 30 days
Start: 2022-08-01 — End: ?

## 2022-08-01 MED ORDER — PROMETHAZINE 25 MG TABLET
ORAL_TABLET | Freq: Four times a day (QID) | ORAL | 11 refills | 6 days | PRN
Start: 2022-08-01 — End: ?

## 2022-08-02 MED ORDER — DRONABINOL 10 MG CAPSULE
ORAL_CAPSULE | Freq: Two times a day (BID) | ORAL | 0 refills | 30 days | Status: CP
Start: 2022-08-02 — End: ?

## 2022-08-07 MED ORDER — DRONABINOL 10 MG CAPSULE
ORAL_CAPSULE | Freq: Two times a day (BID) | ORAL | 0 refills | 30.00000 days | Status: CN
Start: 2022-08-07 — End: ?

## 2022-08-07 MED ORDER — PROMETHAZINE 25 MG TABLET
ORAL_TABLET | Freq: Four times a day (QID) | ORAL | 11 refills | 6 days | PRN
Start: 2022-08-07 — End: ?

## 2022-08-07 NOTE — Unmapped (Signed)
Received refill request from [facility] for Marinol and Phenergan. Called pharmacy. Per pharmacy staff, they do not need Phenergan refill as they have the May prescription with 11 refills. They did not receive Dr. Casilda Carls refill for Marinol (printed vs e-scribe on 7/5), but cannot order Marinol currently due to back-order. They have 2.5 mg tabs in a blister pack of 30 tabs.  No rx given at this time and will try other pharmacies, check in with Dr. Elizebeth Brooking, and pt.  Called pt left message to inquire about pharmacy preferences.

## 2022-08-07 NOTE — Unmapped (Signed)
Called to [facility]. They may be able to order but with ongoing backorder, it's difficult to predict.  They will send refill request for Dr. Casilda Carls consideration/approval and will monitor shipments for this dose. We discussed this is an ongoing need for pt.

## 2022-08-07 NOTE — Unmapped (Signed)
[facility] unable to fill Marinol due to back-order and has active refills for Phenergan.No refills to [facility] at this time.

## 2022-08-13 NOTE — Unmapped (Signed)
[facility] Refill Coordination Note    Specialty Medication(s) to be Shipped:   Inflammatory Disorders: Stelara    Other medication(s) to be shipped: No additional medications requested for fill at this time     Rodney Bender, DOB: 16-Jun-1970  Phone: [phone] (home)       All above HIPAA information was verified with patient.     Was a Nurse, learning disability used for this call? No    Completed refill call assessment today to schedule patient's medication shipment from the [facility] [phone]).  All relevant notes have been reviewed.     Specialty medication(s) and dose(s) confirmed: Regimen is correct and unchanged.   Changes to medications: Rodney Bender reports no changes at this time.  Changes to insurance: No  New side effects reported not previously addressed with a pharmacist or physician: None reported  Questions for the pharmacist: No    Confirmed patient received a Conservation officer, historic buildings and a Surveyor, mining with first shipment. The patient will receive a drug information handout for each medication shipped and additional FDA Medication Guides as required.       DISEASE/MEDICATION-SPECIFIC INFORMATION        For patients on injectable medications: Patient currently has 0 doses left.  Next injection is scheduled for 7/27.    SPECIALTY MEDICATION ADHERENCE     Medication Adherence    Patient reported X missed doses in the last month: 0  Specialty Medication: STELARA 90 mg/mL  Patient is on additional specialty medications: No  Informant: patient              Were doses missed due to medication being on hold? No    REFERRAL TO PHARMACIST     Referral to the pharmacist: Not needed      [facility]     Shipping address confirmed in Epic.       Delivery Scheduled: Yes, Expected medication delivery date: 7/29.     Medication will be delivered via Same Day Courier to the prescription address in Epic WAM.    Julianne Rice, PharmD   [facility] Specialty Pharmacist

## 2022-08-16 MED FILL — STELARA 90 MG/ML SUBCUTANEOUS SYRINGE: SUBCUTANEOUS | 56 days supply | Qty: 1 | Fill #3

## 2022-08-17 MED ORDER — OLANZAPINE 2.5 MG TABLET
ORAL_TABLET | Freq: Every evening | ORAL | 0 refills | 0 days
Start: 2022-08-17 — End: ?

## 2022-09-02 MED ORDER — OLANZAPINE 2.5 MG TABLET
ORAL_TABLET | Freq: Every evening | ORAL | 0 refills | 30 days | Status: CP
Start: 2022-09-02 — End: ?

## 2022-09-06 MED ORDER — DRONABINOL 10 MG CAPSULE
ORAL_CAPSULE | Freq: Two times a day (BID) | ORAL | 0 refills | 30 days | Status: CP
Start: 2022-09-06 — End: ?

## 2022-09-06 NOTE — Unmapped (Signed)
Addended by: Hunt Oris on: 09/06/2022 06:28 AM     Modules accepted: Orders

## 2022-09-20 ENCOUNTER — Ambulatory Visit: Admit: 2022-09-20 | Discharge: 2022-09-20 | Payer: MEDICARE

## 2022-09-20 DIAGNOSIS — K224 Dyskinesia of esophagus: Principal | ICD-10-CM

## 2022-09-20 DIAGNOSIS — K50012 Crohn's disease of small intestine with intestinal obstruction: Principal | ICD-10-CM

## 2022-09-20 DIAGNOSIS — K219 Gastro-esophageal reflux disease without esophagitis: Principal | ICD-10-CM

## 2022-09-20 LAB — COMPREHENSIVE METABOLIC PANEL
ALBUMIN: 4.5 g/dL (ref 3.4–5.0)
ALKALINE PHOSPHATASE: 113 U/L (ref 46–116)
ALT (SGPT): 26 U/L (ref 10–49)
ANION GAP: 4 mmol/L — ABNORMAL LOW (ref 5–14)
AST (SGOT): 28 U/L (ref <=34)
BILIRUBIN TOTAL: 0.4 mg/dL (ref 0.3–1.2)
BLOOD UREA NITROGEN: 10 mg/dL (ref 9–23)
BUN / CREAT RATIO: 10
CALCIUM: 10.3 mg/dL (ref 8.7–10.4)
CHLORIDE: 105 mmol/L (ref 98–107)
CO2: 26 mmol/L (ref 20.0–31.0)
CREATININE: 0.98 mg/dL
EGFR CKD-EPI (2021) MALE: >90 mL/min/{1.73_m2} (ref >=60)
GLUCOSE RANDOM: 101 mg/dL (ref 70–179)
POTASSIUM: 4.5 mmol/L (ref 3.4–4.8)
PROTEIN TOTAL: 8.2 g/dL (ref 5.7–8.2)
SODIUM: 135 mmol/L (ref 135–145)

## 2022-09-20 LAB — FERRITIN: FERRITIN: 256.5 ng/mL

## 2022-09-20 MED ORDER — ONDANSETRON 4 MG DISINTEGRATING TABLET
ORAL_TABLET | Freq: Three times a day (TID) | 11 refills | 8 days | Status: CP | PRN
Start: 2022-09-20 — End: ?

## 2022-09-20 MED ORDER — PANTOPRAZOLE 40 MG TABLET,DELAYED RELEASE
ORAL_TABLET | Freq: Two times a day (BID) | ORAL | 3 refills | 90 days | Status: CP
Start: 2022-09-20 — End: 2023-09-20

## 2022-09-20 NOTE — Unmapped (Signed)
Addended by: Hunt Oris on: 09/20/2022 04:12 PM     Modules accepted: Orders

## 2022-09-20 NOTE — Unmapped (Signed)
[facility]  Faculty Established Follow-up Visit Note    REFERRING PROVIDER: Hunt Oris, MD  [address]  [address]  [place],  [place] [address]    PRIMARY CARE PROVIDER: Corky Mull, MD    Assessment and Plan:  Rodney Bender is a 52 y.o. 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    Crohn's disease of small intestine with other complication  Historically he has had very high risk disease with an ileocecectomy and multiple revisions of the anastomosis losing a reasonable amount of small bowel though substantially less than 50%. Non-adherence before he got married was almost certainly a factor in this for him. He struggles to maintain his weight and we have tried multiple appetite stimulants for some time, holding our own but not gaining significant weight either. He reports he is adherent to his Stelara and has had relatively recent imaging and EGD/colonoscopy.  - Continue stelara every 8 weeks  - CMP, CBC diff, iron studies, INR    Functional dyspepsia: He has chronic functional dyspepsia symptoms that do not seem to correlate with his IBD disease activity. Given he is having some chronic nausea today, Zyprexa might be helpful for appetite and weight gain as well as his nausea is reasonably safe.  We have tried centrally acting agents in the past but he gets better relief from gabapentin.  He is additionally having some reflux symptoms and is possible that GERD can contribute to his dyspepsia as well. He remains on pantoprazole which has been useful for his heartburn symptoms, but does not dramatically help with his dyspepsia. Today, it seemed less clear these symptoms localize to his epigastric area, and perhaps esophageal. He does not think he can get though an esophageal manometry.   - Continue pantoprazole 40 mg once daily 30 minutes before meal  - Continue gabapentin to 600 mg 3 times daily  - Continue Marinol  - Try empiric LES dilation and botox    Patient Instructions   Next steps to execute our plan:  Call [phone] to schedule an upper endoscopy for dilation and Botox with the endFLIP test.  Continue ondansetron solutabs (Zofran-ODT) 4-8 mg (1-2 disintegrating tablets) every 8 hours as needed.  Increase pantoprazole (Protonix) 40 mg (one tablet) twice daily, ideally 30 minutes before meals.  We'll check labs for you Crohn's disease.   -------------------------------------------------------------------------------  Phone numbers you may need:    For follow-up appointments call the GI clinic schedulers at [phone].   For urgent follow-up appointments ask me on MyChart or ask my nurse, Alyse Low, at [phone].    For scheduling GI procedures (Upper endoscopy, colonoscopy, esophageal manometry, pH/impedence test) call the the GI procedures schedulers at [phone].  For scheduling radiology exams (Barium tests, CT scans, MRIs) call Radiology at [phone].  For scheduling exams or procedures call interventional radiology (VIR) at [phone].  For scheduling lab draws (blood, stool, urine) call [facility] lab at [phone].    For non-urgent clinical questions, use MyChart, or call my nurse, Alyse Low, at [phone].  For urgent clinical questions call my nurse, Alyse Low, at [phone].  For urgent questions after hours you can call [phone] and ask for the GI medicine fellow on call.  For emergencies call 911 and present to the emergency department.    ==============================================    Return in about 6 months (  around 03/23/2023).    I personally spent 10 minutes face-to-face and non-face-to-face in the care of this patient, which includes all pre, intra, and post visit time on the date of service.  All documented time was specific to the E/M visit and does not include any procedures that may have been performed.     Roddy Bellamy C. Elizebeth Brooking, MD MPH  Clinical Assistant Professor  Gastroenterology and Hepatology     -------------------------    Chief Complaint    Follow-up       History of Present Illness:      Rodney Bender is a 52 y.o. 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    Interval history:  He's overall doing well, still bothersome postprandial dyspepsia. It's interesting he points very high epigastric maybe low sternal for this. I note he's on chronic opioids, it's been difficult for him to get off these though he understands the risks to his IBD. The symptoms are almost immediately on swallowing.     Past Medical History:   Diagnosis Date    Acid reflux     Anxiety     Crohn's disease (CMS-HCC)     diagnosed in 1990    GERD (gastroesophageal reflux disease)     Hypertension 10/08/2016     Past Surgical History:  Ileocolectomy with anastomosis and multiple IR drains about 5 cm ileum and colon (09/16/2016)  Ileocolectomy about 15 cm (2009)  Ileocecectomy unclear extent (1998)     Current Outpatient Medications on File Prior to Visit   Medication Sig Dispense Refill    busPIRone (BUSPAR) 10 MG tablet TAKE 2 TABLETS BY MOUTH THREE TIMES DAILY 180 tablet 0    carisoprodol (SOMA) 350 MG tablet       citalopram (CELEXA) 40 MG tablet Take 1 tablet (40 mg total) by mouth daily. 30 tablet 11    cyclobenzaprine (FLEXERIL) 5 MG tablet       dicyclomine (BENTYL) 20 mg tablet       dronabinol (MARINOL) 10 MG capsule Take 1 capsule (10 mg total) by mouth Two (2) times a day (30 minutes before a meal). 60 capsule 0    erythromycin (ROMYCIN) 5 mg/gram (0.5 %) ophthalmic ointment Apply 1 application. (1 Application total) to eye.      ferrous sulfate 325 (65 FE) MG tablet TAKE 1 TABLET BY MOUTH EVERY OTHER DAY 60 tablet 0    gabapentin (NEURONTIN) 300 MG capsule Take 2 capsules (600 mg total) by mouth Three (3) times a day. 180 capsule 11    HYDROcodone-acetaminophen (NORCO) 5-325 mg per tablet take 1 to 2 tablets by mouth every 6 hours if needed      hydrOXYzine (ATARAX) 25 MG tablet Take 1 tablet (25 mg total) by mouth Three (3) times a day as needed for anxiety. 90 tablet 3    loperamide (IMODIUM A-D) 2 mg tablet Take 1 tablet (2 mg total) by mouth daily as needed.      meloxicam (MOBIC) 7.5 MG tablet Take 1 tablet twice a day by oral route with meals.      naloxone (NARCAN) 4 mg nasal spray       OLANZapine (ZYPREXA) 2.5 MG tablet Take 1 tablet by mouth nightly 30 tablet 0    oxyCODONE-acetaminophen (PERCOCET) 10-325 mg per tablet Take 1 tablet by mouth every eight (8) hours as needed.      predniSONE (  DELTASONE) 10 MG tablet       promethazine (PHENERGAN) 25 MG tablet Take 2 tablets (50 mg total) by mouth every six (6) hours as needed for nausea. 45 tablet 11    sertraline (ZOLOFT) 50 MG tablet       sildenafiL (VIAGRA) 100 MG tablet Take 1 tablet (100 mg total) by mouth daily as needed for erectile dysfunction. 90 tablet 3    testosterone 20.25 mg/1.25 gram (1.62 %) gel pump Place 2 sprays (40.5 mg total) on the skin daily. 75 g 5    therapeutic multivitamin (THERAGRAN) tablet Take 1 tablet by mouth daily. 90 tablet 3    ustekinumab (STELARA) 90 mg/mL Syrg syringe Inject the contents of 1 syringe (90 mg total) under the skin every 8 weeks. 1 mL 5     No current facility-administered medications on file prior to visit.     Allergies  Reviewed on 09/20/2022        Reactions Comments    Tramadol Itching, Anxiety Other reaction(s): Other (See Comments)    Penicillins  Other reaction(s): Itching-Allergy    Dilaudid [hydromorphone] Anxiety     Morphine Itching High doses            Family History   Problem Relation Age of Onset    Hyperlipidemia Father     Cancer Father         kidney    Cancer Maternal Aunt     Stroke Mother     No Known Problems Sister     No Known Problems Brother     No Known Problems Maternal Uncle     No Known Problems Paternal Aunt     No Known Problems Paternal Uncle     No Known Problems Maternal Grandmother     No Known Problems Maternal Grandfather     No Known Problems Paternal Grandmother     No Known Problems Paternal Grandfather     Anesthesia problems Neg Hx     Broken bones Neg Hx     Clotting disorder Neg Hx     Collagen disease Neg Hx     Diabetes Neg Hx     Dislocations Neg Hx     Fibromyalgia Neg Hx     Gout Neg Hx     Hemophilia Neg Hx     Osteoporosis Neg Hx     Rheumatologic disease Neg Hx     Scoliosis Neg Hx     Severe sprains Neg Hx     Sickle cell anemia Neg Hx     Spinal Compression Fracture Neg Hx      Social History     Tobacco Use    Smoking status: Former     Current packs/day: 0.00     Average packs/day: 1 pack/day for 18.0 years (18.0 ttl pk-yrs)     Types: Cigarettes     Start date: 08/27/2003     Quit date: 06/06/2017     Years since quitting: 5.2    Smokeless tobacco: Never    Tobacco comments:     Pt smokes 1ppd, Pt is interested in tobacco cessation    Vaping Use    Vaping status: Never Used   Substance Use Topics    Alcohol use: Not Currently    Drug use: Not Currently     Types: Marijuana     Review of Systems:  The balance of 12 systems reviewed is negative except as noted in the history of present  illness.    Vital Signs: BP 109/72  - Pulse 68  - Temp 36.9 ??C (98.4 ??F) (Temporal)  - Ht 180.3 cm (5' 11)  - Wt 70 kg (154 lb 6.4 oz)  - BMI 21.53 kg/m??     Wt Readings from Last 6 Encounters:   09/20/22 70 kg (154 lb 6.4 oz)   03/04/22 67.7 kg (149 lb 3.2 oz)   03/01/22 67.4 kg (148 lb 9.6 oz)   01/04/22 66 kg (145 lb 9.6 oz)   11/23/21 64.5 kg (142 lb 3.2 oz)   11/12/21 65.3 kg (144 lb)     Review of Systems:  General appearance: Appears well, no distress and cachectic.  Eyes: Anicteric sclera. No erythema.  ENT: No oral ulcers. MMM  Cardiovascular: RRR, WWP. No lower extremity edema.  Pulmonary: Normal work of breathing. Acyanotic.  Abdominal: soft, tender epigastrium, nondistended, no masses or organomegaly.  Musculoskeletal: No temporal wasting. Normal joints of the hand.  Skin: No jaundice. No rashes.  Neurologic: Alert, oriented, and appropriate.  Psychiatric: Appropriate.    Data Review:  Review of labs found:  10/24/21 - Hgb 11.6, nl LFTs and Cr, ferritin 87    Review of endoscopy found:  Colonoscopy - 10/18/21 - i0 at anastomosis, SES-CD 1  Upper endoscopy - 10/18/21 - nl  Colonoscopy - 05/15/2020 - i1 at anastomosis, SESCD 3, one 2 mm polyp  Colonoscopy - 02/12/2019 - i1 at anastomosis, no more proximal ileal inflammation  Colonoscopy - 04/06/2018 - i0 at anastomosis  Colonoscopy - 03/21/2017 - i1 at anastomosis, one 2mm polup  Colonoscopy - 08/09/2016 - occlusive ulcerated stricture at anastomosis   Upper endoscopy - 07/15/2014 - nl  Colonoscopy - 07/15/2014 - inflammed anastomosis dilated to 12-78mm, scattered apthae distal ileum  Colonoscopy - 07/21/2013 - i0 anastomosis, nl TI, hemorrhoids  Upper endoscopy - 07/21/2013 - nl  Upper endoscopy - 11/10/2012 - nl  Colonoscopy - 11/10/2012 - traversable anastomosis with i4 inflammation, and ulcerations in the TI  Note procedures on record back to 06/12/1998    Review of imaging found:  CT abdomen/pelvis w contrast - multiple air fluid levels in colon, nonspecific but c/w diarrheal illness  CT enterography - 04/07/2020 - scattered small bowel narrowing without adjacent inflammatory stranding  CT abdomen/pelvis w contrast - 02/08/2019 - Diffuse small bowel wall thickening is seen with surrounding free fluid suggestive of active inflammation  CT abdomen/pelvis w contrast - 12/20/2017 - Mucosal thickening and enhancement in the left hemiabdomen small bowel with associated engorged mesenteric vessels. Mucosal wall thickening in the sigmoid colon and rectum  CT abdomen/pelvis w IV and oral contrast - 03/12/2017 - Status post ileocecectomy, unremarkable  Note exams on record back to 06/16/1998  .  Review of other studies found:  None

## 2022-09-20 NOTE — Unmapped (Signed)
Next steps to execute our plan:  Call [phone] to schedule an upper endoscopy for dilation and Botox with the endFLIP test.  Continue ondansetron solutabs (Zofran-ODT) 4-8 mg (1-2 disintegrating tablets) every 8 hours as needed.  Increase pantoprazole (Protonix) 40 mg (one tablet) twice daily, ideally 30 minutes before meals.  We'll check labs for you Crohn's disease.   -------------------------------------------------------------------------------  Phone numbers you may need:    For follow-up appointments call the GI clinic schedulers at [phone].   For urgent follow-up appointments ask me on MyChart or ask my nurse, Alyse Low, at [phone].    For scheduling GI procedures (Upper endoscopy, colonoscopy, esophageal manometry, pH/impedence test) call the the GI procedures schedulers at [phone].  For scheduling radiology exams (Barium tests, CT scans, MRIs) call Radiology at [phone].  For scheduling exams or procedures call interventional radiology (VIR) at [phone].  For scheduling lab draws (blood, stool, urine) call [facility] lab at [phone].    For non-urgent clinical questions, use MyChart, or call my nurse, Alyse Low, at [phone].  For urgent clinical questions call my nurse, Alyse Low, at [phone].  For urgent questions after hours you can call [phone] and ask for the GI medicine fellow on call.  For emergencies call 911 and present to the emergency department.    ==============================================

## 2022-09-23 NOTE — Unmapped (Signed)
EGD  Procedure #1     Procedure #2   [id number]  MRN   C[facility]TON  Endoscopist     Is the patient's health insurance [address], [facility]), or [employer]?     Urgent procedure     Are you pregnant?     Are you in the process of scheduling or awaiting results of a heart ultrasound, stress test, or catheterization to evaluate new or worsening chest pain, dizziness, or shortness of breath?     Do you take: Plavix (clopidogrel), Coumadin (warfarin), Lovenox (enoxaparin), Pradaxa (dabigatran), Effient (prasugrel), Xarelto (rivaroxaban), Eliquis (apixaban), Pletal (cilostazol), or Brilinta (ticagrelor)?          Which of the above medications are you taking?          What is the name of the medical practice that manages this medication?          What is the name of the medical provider who manages this medication?     Do you have hemophilia, von Willebrand disease, or low platelets?     Do you have a pacemaker or implanted cardiac defibrillator?     Has a [facility] provider specified the location(s)?     Which location(s) did the [facility] provider specify?        [facility]        [facility]        [facility]     Is procedure indication for variceal banding (this does NOT include variceal screening)?     Have you had a heart attack, stroke or heart stent placement within the past 6 months?     Month of event     Year of event (ONLY ENTER LAST 2 DIGITS)        5  Height (feet)   11  Height (inches)   159  Weight (pounds)   22.2  BMI          Did the ordering provider specify a bowel prep?          What bowel prep was specified?     Do you have chronic kidney disease?     Do you have chronic constipation or have you had poor quality bowel preps for past colonoscopies?     Do you have Crohn's disease or ulcerative colitis?     Have you had weight loss surgery?          When you walk around your house or grocery store, do you have to stop and rest due to shortness of breath, chest pain, or light-headedness?     Do you ever use supplemental oxygen?     Have you been hospitalized for cirrhosis of the liver or heart failure in the last 12 months?     Have you been treated for mouth or throat cancer with radiation or surgery?     Have you been told that it is difficult for doctors to insert a breathing tube in you during anesthesia?     Have you had a heart or lung transplant?          Are you on dialysis?     Do you have cirrhosis of the liver?     Do you have myasthenia gravis?     Is the patient a prisoner?          Have you been diagnosed with sleep apnea or do you wear a  CPAP machine at night?     Are you younger than 30?     Have you previously received propofol sedation administered by an anesthesiologist for a GI procedure?     Do you drink an average of more than 3 drinks of alcohol per day?   TRUE  Do you regularly take suboxone or any prescription medications for chronic pain?     Do you regularly take Ativan, Klonopin, Xanax, Valium, lorazepam, clonazepam, alprazolam, or diazepam?     Have you previously had difficulty with sedation during a GI procedure?     Have you been diagnosed with PTSD?     Are you allergic to fentanyl or midazolam (Versed)?     Do you take medications for HIV?   ################# ## ###################################################################################################################   MRN:          [id number]   Anticoag Review:  No   Nurse Triage:  No   GI Clinic Consult:  No   Procedure(s):  EGD     0   [facility]  [facility]     [facility]     [facility]        Endoscopist:  COTTON   Urgent:            No   Prep:                                 ################# ## ###################################################################################################################

## 2022-09-24 ENCOUNTER — Ambulatory Visit: Payer: Medicare Other | Attending: Anesthesiology | Admitting: Anesthesiology

## 2022-09-24 ENCOUNTER — Encounter: Payer: Self-pay | Admitting: Anesthesiology

## 2022-09-24 VITALS — BP 161/89 | HR 80 | Temp 97.0°F | Resp 16 | Ht 71.0 in | Wt 159.0 lb

## 2022-09-24 DIAGNOSIS — R200 Anesthesia of skin: Secondary | ICD-10-CM | POA: Diagnosis present

## 2022-09-24 DIAGNOSIS — K5090 Crohn's disease, unspecified, without complications: Secondary | ICD-10-CM | POA: Diagnosis not present

## 2022-09-24 DIAGNOSIS — M542 Cervicalgia: Secondary | ICD-10-CM | POA: Diagnosis not present

## 2022-09-24 DIAGNOSIS — M549 Dorsalgia, unspecified: Secondary | ICD-10-CM | POA: Insufficient documentation

## 2022-09-24 DIAGNOSIS — F1190 Opioid use, unspecified, uncomplicated: Secondary | ICD-10-CM

## 2022-09-24 DIAGNOSIS — R109 Unspecified abdominal pain: Secondary | ICD-10-CM | POA: Insufficient documentation

## 2022-09-24 DIAGNOSIS — G894 Chronic pain syndrome: Secondary | ICD-10-CM | POA: Diagnosis not present

## 2022-09-24 DIAGNOSIS — Z79891 Long term (current) use of opiate analgesic: Secondary | ICD-10-CM

## 2022-09-24 DIAGNOSIS — G8929 Other chronic pain: Secondary | ICD-10-CM | POA: Diagnosis present

## 2022-09-24 DIAGNOSIS — M5412 Radiculopathy, cervical region: Secondary | ICD-10-CM | POA: Diagnosis present

## 2022-09-24 DIAGNOSIS — M47816 Spondylosis without myelopathy or radiculopathy, lumbar region: Secondary | ICD-10-CM | POA: Diagnosis not present

## 2022-09-24 MED ORDER — OXYCODONE-ACETAMINOPHEN 10-325 MG PO TABS: 1.0000 | ORAL_TABLET | Freq: Three times a day (TID) | ORAL | 0 refills | Status: AC | PRN

## 2022-09-24 MED ORDER — OXYCODONE-ACETAMINOPHEN 10-325 MG PO TABS: 1.0000 | ORAL_TABLET | Freq: Four times a day (QID) | ORAL | 0 refills | Status: DC | PRN

## 2022-09-24 NOTE — Progress Notes (Unsigned)
Nursing Pain Medication Assessment:  Safety precautions to be maintained throughout the outpatient stay will include: orient to surroundings, keep bed in low position, maintain call bell within reach at all times, provide assistance with transfer out of bed and ambulation.  Medication Inspection Compliance: Pill count conducted under aseptic conditions, in front of the patient. Neither the pills nor the bottle was removed from the patient's sight at any time. Once count was completed pills were immediately returned to the patient in their original bottle.  Medication: Oxycodone/APAP Pill/Patch Count:  3 of 75 pills remain Pill/Patch Appearance: Markings consistent with prescribed medication Bottle Appearance: Standard pharmacy container. Clearly labeled. Filled Date: 08 / 01 / 2024 Last Medication intake:  Today

## 2022-09-25 NOTE — Progress Notes (Signed)
Subjective:  Patient ID: Benjamin Murillo, male    DOB: [birth year]  Age: 52 y.o. MRN: [id number]  CC: Abdominal Pain   Procedure: None  HPI BRYNDON KUSIAK presents for reevaluation.  Orange continues to have a lot of abdominal pain and is currently being seen by his gastroenterologist for some increasing reflux symptoms in addition to his baseline mid abdominal pain.  This pain has been a chronic ongoing issue for him followed by gastroenterology who referred him to [place] for chronic abdominal pain.  He is on chronic opioid therapy for that and does well with a regimen taking 75 tablets/month.  He reports that recently has had more breakthrough pain.  The quality characteristic and distribution of his low back pain mid abdominal pain and intermittent neck pain is stable in nature with no recent changes.  He has had more problems secondary to the reflux issues and is scheduled for possible endoscopy he reports sometime within the next few weeks.  No side effects with the medications are noted and he continues to get good relief allowing him more pain-free episodes during the day when he is on chronic opioid therapy than without.  Unfortunately has failed more conservative therapy and has been reliant on chronic opioid therapy for a considerable time.  Outpatient Medications Prior to Visit  Medication Sig Dispense Refill   amlodipine-atorvastatin (CADUET) 10-10 MG tablet Take 1 tablet by mouth daily.     dronabinol (MARINOL) 10 MG capsule Take 10 mg by mouth 2 (two) times daily before a meal.     erythromycin ophthalmic ointment Place 1 Application into the left eye at bedtime. 3.5 g 0   famotidine (PEPCID) 20 MG tablet Take 20 mg by mouth 2 (two) times daily.     loperamide (IMODIUM A-D) 2 MG tablet Take 2 mg by mouth as needed.     naloxone (NARCAN) nasal spray 4 mg/0.1 mL For respiratory depression from opioids 1 kit 2   predniSONE (STERAPRED UNI-PAK 21 TAB) 10 MG (21) TBPK tablet 6 tablets day  one and day two, five tablets day three and day four, and daily taper over 12days as directed 42 tablet 0   prochlorperazine (COMPAZINE) 5 MG tablet Take 5 mg by mouth every 8 (eight) hours as needed.     STELARA 90 MG/ML SOSY injection Inject 90 mg as directed.     oxyCODONE-acetaminophen (PERCOCET) 10-325 MG tablet Take 1 tablet by mouth every 6 (six) hours as needed for pain. 75 tablet 0   citalopram (CELEXA) 10 MG tablet Take 10 mg by mouth daily as needed.     gabapentin (NEURONTIN) 100 MG capsule Take 1 capsule (100 mg total) by mouth at bedtime. 30 capsule 3   gabapentin (NEURONTIN) 300 MG capsule Take 1 capsule (300 mg total) by mouth 3 (three) times daily. 90 capsule 3   hyoscyamine (LEVSIN, ANASPAZ) 0.125 MG tablet Take 0.125 mg by mouth as needed.      No facility-administered medications prior to visit.    Review of Systems CNS: No confusion or sedation Cardiac: No angina or palpitations GI: No abdominal pain or constipation Constitutional: No nausea vomiting fevers or chills  Objective:  BP (!) 161/89   Pulse 80   Temp (!) 97 F (36.1 C)   Resp 16   Ht 5\' 11"  (1.803 m)   Wt 159 lb (72.1 kg)   SpO2 100%   BMI 22.18 kg/m    BP Readings from Last 3 Encounters:  09/24/22 (!) 161/89  10/15/21 119/85  10/08/21 137/86     Wt Readings from Last 3 Encounters:  09/24/22 159 lb (72.1 kg)  10/15/21 139 lb 15.9 oz (63.5 kg)  10/08/21 140 lb (63.5 kg)     Physical Exam Pt is alert and oriented PERRL EOMI HEART IS RRR no murmur or rub LCTA no wheezing or rales MUSCULOSKELETAL reveals good strength in the upper extremities both proximal and distal of the bicep tricep is 5/5 some pain with atlantooccipital extension and flexion but good range of motion.  Sensation and muscle tone and bulk are intact.  He ambulates well.  Muscle tone and bulk to the lower extremities is good.  Labs  No results found for: "HGBA1C" Lab Results  Component Value Date   CREATININE 0.87  03/18/2018    -------------------------------------------------------------------------------------------------------------------- Lab Results  Component Value Date   WBC 10.1 03/18/2018   HGB 13.5 03/18/2018   HCT 39.6 03/18/2018   PLT 218 03/18/2018   GLUCOSE 65 (L) 03/18/2018   ALT 19 03/18/2018   AST 29 03/18/2018   NA 130 (L) 03/18/2018   K 4.1 03/18/2018   CL 95 (L) 03/18/2018   CREATININE 0.87 03/18/2018   BUN 16 03/18/2018   CO2 20 (L) 03/18/2018   TSH 0.667 03/18/2018    --------------------------------------------------------------------------------------------------------------------- No results found.   Assessment & Plan:   Deren was seen today for abdominal pain.  Diagnoses and all orders for this visit:  Cervicalgia  Crohn's disease without complication, unspecified gastrointestinal tract location [facility])  Chronic pain syndrome  Facet arthritis of lumbar region  Chronic abdominal pain  Chronic, continuous use of opioids  Chronic neck and back pain  Cervical radiculitis  Right arm numbness  Other orders -     oxyCODONE-acetaminophen (PERCOCET) 10-325 MG tablet; Take 1 tablet by mouth every 6 (six) hours as needed for pain. -     oxyCODONE-acetaminophen (PERCOCET) 10-325 MG tablet; Take 1 tablet by mouth every 8 (eight) hours as needed for pain.        ----------------------------------------------------------------------------------------------------------------------  Problem List Items Addressed This Visit       Unprioritized   Chronic pain syndrome (Chronic)   Relevant Medications   oxyCODONE-acetaminophen (PERCOCET) 10-325 MG tablet (Start on 10/26/2022)   oxyCODONE-acetaminophen (PERCOCET) 10-325 MG tablet   Cervical radiculitis   Chronic abdominal pain   Relevant Medications   oxyCODONE-acetaminophen (PERCOCET) 10-325 MG tablet (Start on 10/26/2022)   oxyCODONE-acetaminophen (PERCOCET) 10-325 MG tablet   Crohn disease (HCC)    Other Visit Diagnoses     Cervicalgia    -  Primary   Facet arthritis of lumbar region       Relevant Medications   oxyCODONE-acetaminophen (PERCOCET) 10-325 MG tablet (Start on 10/26/2022)   oxyCODONE-acetaminophen (PERCOCET) 10-325 MG tablet   Chronic, continuous use of opioids       Chronic neck and back pain       Relevant Medications   oxyCODONE-acetaminophen (PERCOCET) 10-325 MG tablet (Start on 10/26/2022)   oxyCODONE-acetaminophen (PERCOCET) 10-325 MG tablet   Right arm numbness             ----------------------------------------------------------------------------------------------------------------------  1. Cervicalgia Continue stretching strengthening exercises as reviewed  2. Crohn's disease without complication, unspecified gastrointestinal tract location [facility]) Continue follow-up with his GI doctors for ongoing evaluation  3. Chronic pain syndrome I have reviewed the [facility] practitioner database information I think it is appropriate for refill.  Secondary to the recent exacerbation and increasing frequency of breakthrough  pain I am going to increase his dosing from 75 to 90 tablets/month for the next 2 months.  Continue follow-up with his primary care physicians for baseline medical care  4. Facet arthritis of lumbar region Continue core stretching strengthening exercises as reviewed  5. Chronic abdominal pain Continue follow-up with GI  6. Chronic, continuous use of opioids As above  7. Chronic neck and back pain   8. Cervical radiculitis This has improved with primarily centralized cervical pain intermittently generally well-managed with medication therapy  9. Right arm numbness Appears to have resolved.    ----------------------------------------------------------------------------------------------------------------------  I am having Vilinda Blanks. Rausch start on oxyCODONE-acetaminophen. I am also having him maintain his  amlodipine-atorvastatin, dronabinol, naloxone, hyoscyamine, famotidine, Stelara, prochlorperazine, gabapentin, citalopram, loperamide, predniSONE, gabapentin, erythromycin, and oxyCODONE-acetaminophen.   Meds ordered this encounter  Medications   oxyCODONE-acetaminophen (PERCOCET) 10-325 MG tablet    Sig: Take 1 tablet by mouth every 6 (six) hours as needed for pain.    Dispense:  90 tablet    Refill:  0   oxyCODONE-acetaminophen (PERCOCET) 10-325 MG tablet    Sig: Take 1 tablet by mouth every 8 (eight) hours as needed for pain.    Dispense:  90 tablet    Refill:  0   Patient's Medications  New Prescriptions   OXYCODONE-ACETAMINOPHEN (PERCOCET) 10-325 MG TABLET    Take 1 tablet by mouth every 8 (eight) hours as needed for pain.  Previous Medications   AMLODIPINE-ATORVASTATIN (CADUET) 10-10 MG TABLET    Take 1 tablet by mouth daily.   CITALOPRAM (CELEXA) 10 MG TABLET    Take 10 mg by mouth daily as needed.   DRONABINOL (MARINOL) 10 MG CAPSULE    Take 10 mg by mouth 2 (two) times daily before a meal.   ERYTHROMYCIN OPHTHALMIC OINTMENT    Place 1 Application into the left eye at bedtime.   FAMOTIDINE (PEPCID) 20 MG TABLET    Take 20 mg by mouth 2 (two) times daily.   GABAPENTIN (NEURONTIN) 100 MG CAPSULE    Take 1 capsule (100 mg total) by mouth at bedtime.   GABAPENTIN (NEURONTIN) 300 MG CAPSULE    Take 1 capsule (300 mg total) by mouth 3 (three) times daily.   HYOSCYAMINE (LEVSIN, ANASPAZ) 0.125 MG TABLET    Take 0.125 mg by mouth as needed.    LOPERAMIDE (IMODIUM A-D) 2 MG TABLET    Take 2 mg by mouth as needed.   NALOXONE (NARCAN) NASAL SPRAY 4 MG/0.1 ML    For respiratory depression from opioids   PREDNISONE (STERAPRED UNI-PAK 21 TAB) 10 MG (21) TBPK TABLET    6 tablets day one and day two, five tablets day three and day four, and daily taper over 12days as directed   PROCHLORPERAZINE (COMPAZINE) 5 MG TABLET    Take 5 mg by mouth every 8 (eight) hours as needed.   STELARA 90 MG/ML  SOSY INJECTION    Inject 90 mg as directed.  Modified Medications   Modified Medication Previous Medication   OXYCODONE-ACETAMINOPHEN (PERCOCET) 10-325 MG TABLET oxyCODONE-acetaminophen (PERCOCET) 10-325 MG tablet      Take 1 tablet by mouth every 6 (six) hours as needed for pain.    Take 1 tablet by mouth every 6 (six) hours as needed for pain.  Discontinued Medications   No medications on file   ----------------------------------------------------------------------------------------------------------------------  Follow-up: Return in about 2 months (around 11/24/2022) for evaluation, med refill.    Yevette Edwards, MD

## 2022-09-26 NOTE — Unmapped (Signed)
Pt called and asked that I check with Dr. Elizebeth Brooking. Pt said he thinks Dr. Elizebeth Brooking told him not to have procedures at [facility] as they may not have the needed equipment for his procedure.  He is scheduled for EGD for LES dilation and botox and EndoFLIP.  Message sent to GI schedulers to move pt to [facility] per MD order.

## 2022-09-28 MED ORDER — OLANZAPINE 2.5 MG TABLET
ORAL_TABLET | Freq: Every evening | ORAL | 0 refills | 0 days
Start: 2022-09-28 — End: ?

## 2022-10-07 MED ORDER — OLANZAPINE 2.5 MG TABLET
ORAL_TABLET | Freq: Every evening | ORAL | 0 refills | 30 days
Start: 2022-10-07 — End: ?

## 2022-10-09 MED ORDER — HYDROCODONE 5 MG-ACETAMINOPHEN 325 MG TABLET
ORAL_TABLET | 0 refills | 0 days
Start: 2022-10-09 — End: ?

## 2022-10-10 MED ORDER — HYDROCODONE 5 MG-ACETAMINOPHEN 325 MG TABLET
ORAL_TABLET | 0 refills | 0 days
Start: 2022-10-10 — End: ?

## 2022-10-25 MED ORDER — OLANZAPINE 2.5 MG TABLET
ORAL_TABLET | Freq: Every evening | ORAL | 0 refills | 30 days | Status: CP
Start: 2022-10-25 — End: ?

## 2022-10-25 MED ORDER — DRONABINOL 10 MG CAPSULE
ORAL_CAPSULE | 0 refills | 0 days
Start: 2022-10-25 — End: ?

## 2022-10-26 MED ORDER — GABAPENTIN 300 MG CAPSULE
ORAL_CAPSULE | Freq: Three times a day (TID) | ORAL | 0 refills | 0 days
Start: 2022-10-26 — End: ?

## 2022-10-28 MED ORDER — GABAPENTIN 300 MG CAPSULE
ORAL_CAPSULE | Freq: Three times a day (TID) | ORAL | 5 refills | 30 days | Status: CP
Start: 2022-10-28 — End: ?

## 2022-10-28 MED ORDER — OLANZAPINE 2.5 MG TABLET
ORAL_TABLET | Freq: Every evening | ORAL | 0 refills | 30 days
Start: 2022-10-28 — End: ?

## 2022-10-28 MED ORDER — DRONABINOL 10 MG CAPSULE
ORAL_CAPSULE | 0 refills | 0 days | Status: CP
Start: 2022-10-28 — End: ?

## 2022-10-28 NOTE — Unmapped (Signed)
Refilled gabapentin per protocol and 08/2022 clinic visit plan.

## 2022-10-28 NOTE — Unmapped (Signed)
Zyprexa was refilled x 30 day supply by Dr. Elizebeth Brooking on 10/25/22.

## 2022-11-08 NOTE — Unmapped (Signed)
[facility] Refill Coordination Note    Specialty Medication(s) to be Shipped:   Inflammatory Disorders: Stelara    Other medication(s) to be shipped: No additional medications requested for fill at this time     Rodney Bender, DOB: 1970/06/30  Phone: [phone] (home)       All above HIPAA information was verified with patient.     Was a Nurse, learning disability used for this call? No    Completed refill call assessment today to schedule patient's medication shipment from the [facility]  [phone]).  All relevant notes have been reviewed.     Specialty medication(s) and dose(s) confirmed: Regimen is correct and unchanged.   Changes to medications: [name] reports no changes at this time.  Changes to insurance: No  New side effects reported not previously addressed with a pharmacist or physician: None reported  Questions for the pharmacist: No    Confirmed patient received a Conservation officer, historic buildings and a Surveyor, mining with first shipment. The patient will receive a drug information handout for each medication shipped and additional FDA Medication Guides as required.       DISEASE/MEDICATION-SPECIFIC INFORMATION        For patients on injectable medications: Patient currently has 0 doses left.  Next injection is scheduled for 10/19.    SPECIALTY MEDICATION ADHERENCE     Medication Adherence    Patient reported X missed doses in the last month: 0  Specialty Medication: STELARA 90 mg/mL  Patient is on additional specialty medications: No              Were doses missed due to medication being on hold? No    Stelara 90 mg/ml: 0 doses of medicine on hand        REFERRAL TO PHARMACIST     Referral to the pharmacist: Not needed      [facility]     Shipping address confirmed in Epic.       Delivery Scheduled: Yes, Expected medication delivery date: 11/13/22.     Medication will be delivered via Same Day Courier to the prescription address in Epic [place].    [name]   [facility]  Specialty Technician

## 2022-11-13 MED FILL — STELARA 90 MG/ML SUBCUTANEOUS SYRINGE: SUBCUTANEOUS | 56 days supply | Qty: 1 | Fill #4

## 2022-11-15 ENCOUNTER — Ambulatory Visit: Payer: Medicare Other | Attending: Anesthesiology | Admitting: Anesthesiology

## 2022-11-15 ENCOUNTER — Encounter: Payer: Self-pay | Admitting: Anesthesiology

## 2022-11-15 DIAGNOSIS — M47816 Spondylosis without myelopathy or radiculopathy, lumbar region: Secondary | ICD-10-CM

## 2022-11-15 DIAGNOSIS — K5090 Crohn's disease, unspecified, without complications: Secondary | ICD-10-CM

## 2022-11-15 DIAGNOSIS — F1190 Opioid use, unspecified, uncomplicated: Secondary | ICD-10-CM

## 2022-11-15 DIAGNOSIS — M5412 Radiculopathy, cervical region: Secondary | ICD-10-CM

## 2022-11-15 DIAGNOSIS — G8929 Other chronic pain: Secondary | ICD-10-CM

## 2022-11-15 DIAGNOSIS — G894 Chronic pain syndrome: Secondary | ICD-10-CM

## 2022-11-15 DIAGNOSIS — M542 Cervicalgia: Secondary | ICD-10-CM

## 2022-11-15 DIAGNOSIS — R200 Anesthesia of skin: Secondary | ICD-10-CM

## 2022-11-15 MED ORDER — OXYCODONE-ACETAMINOPHEN 10-325 MG PO TABS: 1.0000 | ORAL_TABLET | Freq: Four times a day (QID) | ORAL | 0 refills | Status: DC | PRN

## 2022-11-15 NOTE — Progress Notes (Signed)
Multiple attempts were made to get in touch with [facility] without success.  A message was left but we will schedule a return eval for the 2-week mark.

## 2022-11-18 ENCOUNTER — Telehealth: Payer: Medicare Other | Admitting: Anesthesiology

## 2022-11-18 ENCOUNTER — Ambulatory Visit: Admit: 2022-11-18 | Discharge: 2022-11-22 | Payer: MEDICARE

## 2022-11-18 ENCOUNTER — Encounter: Admit: 2022-11-18 | Discharge: 2022-11-22 | Payer: MEDICARE

## 2022-11-18 ENCOUNTER — Encounter: Admit: 2022-11-18 | Payer: MEDICARE

## 2022-11-18 MED ADMIN — onabotulinumtoxin Type A (BOTOX) injection: SUBMUCOSAL | @ 19:00:00 | Stop: 2022-11-18

## 2022-11-18 MED ADMIN — Propofol (DIPRIVAN) injection: INTRAVENOUS | @ 19:00:00 | Stop: 2022-11-18

## 2022-11-18 MED ADMIN — lidocaine (PF) (XYLOCAINE-MPF) 20 mg/mL (2 %) injection: INTRAVENOUS | @ 19:00:00 | Stop: 2022-11-18

## 2022-11-20 DIAGNOSIS — M79671 Pain in right foot: Principal | ICD-10-CM

## 2022-11-20 DIAGNOSIS — M722 Plantar fascial fibromatosis: Principal | ICD-10-CM

## 2022-11-20 DIAGNOSIS — M7751 Other enthesopathy of right foot: Principal | ICD-10-CM

## 2022-11-20 MED ORDER — OLANZAPINE 2.5 MG TABLET
ORAL_TABLET | Freq: Every evening | ORAL | 0 refills | 0 days
Start: 2022-11-20 — End: ?

## 2022-11-20 NOTE — Unmapped (Signed)
Thank you for choosing [facility]!  We appreciate the opportunity to participate in your care.      If any questions or concerns arise after your visit, please do not hesitant to contact me by [facility] or by calling our clinical team at [phone].    Please let me know if I can be of assistance with this or other orthopaedic issues in the future.        Voltaren gel  Rest,  ice, elevation

## 2022-11-21 NOTE — Unmapped (Signed)
[facility]  Encounter Provider: Ancil Linsey, NP  Date of Service: 11/20/2022 Last encounter Orthopaedics: Visit date not found   Last encounter this provider: Visit date not found      Notes:     Primary Care Provider: Corky Mull, MD  Referring Provider: Unknown Per Patient Pcp    ICD-10-CM   1. Right foot pain  M79.671   2. Plantar fasciitis of right foot  M72.2   3. Tendinitis of right foot  M77.51    Orthopaedic notes: [id number]  Crohn's. Substance/Tob abuse.   H/o C6-7 ACDF  MR-C 01/2018 - Minimal L.C5-6 foram stenosis.    Physical Function CAT Score: (not recorded)  Pain Interference CAT Score: (not recorded)  Depression CAT Score: (not recorded)  Sleep CAT Score: (not recorded)  [email or web address]     Rodney Bender is a 52 y.o. male   ASSESSMENT     Diffuse atraumatic R foot pain x 1 month        PLAN:     Recommended cam boot and Voltaren gel as patient is unable to take NSAIDs.  Recommended rolling foot over a frozen water bottle or tennis ball.  Recommended heel cord stretching.  Anticipate symptoms will likely improve after period of conservative treatment.  Follow-up will be in 3 weeks with no imaging prior to exam    Requested Prescriptions      No prescriptions requested or ordered in this encounter      Orders Placed This Encounter   Procedures    Orthopedics DME Order    XR Foot 3 Or More Views Right       History:  Reason for visit: Right foot pain    HPI:  52 y.o. male who reports he has experienced very severe diffuse right foot pain for the past month.  There was no injury or change in activity level prior to the onset of symptoms.  Patient indicates it feels like a ball is underneath the arch of his foot.  He has pain at the plantar heel.  He has pain at the dorsal midfoot.  Pain into the toes.  No paresthesia.  No redness swelling or bruising       Medical History Past Medical History:   Diagnosis Date    Acid reflux     Anxiety     Crohn's disease (CMS-HCC)     diagnosed in 1990    GERD (gastroesophageal reflux disease)     Hypertension 10/08/2016      Surgical History Past Surgical History:   Procedure Laterality Date    COLON SURGERY      PR COLONOSCOPY FLX DX W/COLLJ SPEC WHEN PFRMD Left 11/10/2012    Procedure: COLONOSCOPY, FLEXIBLE, PROXIMAL TO SPLENIC FLEXURE; DIAGNOSTIC, W/WO COLLECTION SPECIMEN BY BRUSH OR WASH;  Surgeon: Malcolm Metro, MD;  [facility];  Service: Gastroenterology    PR COLONOSCOPY FLX DX W/COLLJ SPEC WHEN PFRMD N/A 07/21/2013    Procedure: COLONOSCOPY, FLEXIBLE, PROXIMAL TO SPLENIC FLEXURE; DIAGNOSTIC, W/WO COLLECTION SPECIMEN BY BRUSH OR WASH;  Surgeon: Gwen Pounds, MD;  Location: [facility];  Service: Gastroenterology    PR COLONOSCOPY FLX DX W/COLLJ SPEC WHEN PFRMD N/A 08/09/2016    Procedure: COLONOSCOPY, FLEXIBLE, PROXIMAL TO SPLENIC FLEXURE; DIAGNOSTIC, W/WO COLLECTION SPECIMEN BY BRUSH OR WASH;  Surgeon: Janyth Pupa, MD;  Location: [facility];  Service: Gastroenterology    PR COLONOSCOPY FLX DX W/COLLJ SPEC WHEN PFRMD N/A 04/06/2018  Procedure: COLONOSCOPY, FLEXIBLE, PROXIMAL TO SPLENIC FLEXURE; DIAGNOSTIC, W/WO COLLECTION SPECIMEN BY BRUSH OR WASH;  Surgeon: Zetta Bills, MD;  [facility];  Service: Gastroenterology    PR COLONOSCOPY FLX DX W/COLLJ SPEC WHEN PFRMD N/A 10/18/2021    Procedure: COLONOSCOPY, FLEXIBLE, PROXIMAL TO SPLENIC FLEXURE; DIAGNOSTIC, W/WO COLLECTION SPECIMEN BY BRUSH OR WASH;  Surgeon: Hunt Oris, MD;  Location: [facility];  Service: Gastroenterology    PR COLONOSCOPY W/BIOPSY SINGLE/MULTIPLE  07/23/2012    Procedure: COLONOSCOPY, FLEXIBLE, PROXIMAL TO SPLENIC FLEXURE; WITH BIOPSY, SINGLE OR MULTIPLE;  Surgeon: Vickii Chafe, MD;  Location: [facility];  Service: Gastroenterology    PR COLONOSCOPY W/BIOPSY SINGLE/MULTIPLE N/A 07/15/2014    Procedure: COLONOSCOPY, FLEXIBLE, PROXIMAL TO SPLENIC FLEXURE; WITH BIOPSY, SINGLE OR MULTIPLE;  Surgeon: Janyth Pupa, MD;  Location: [facility];  Service: Gastroenterology    PR COLONOSCOPY W/BIOPSY SINGLE/MULTIPLE N/A 03/21/2017    Procedure: COLONOSCOPY, FLEXIBLE, PROXIMAL TO SPLENIC FLEXURE; WITH BIOPSY, SINGLE OR MULTIPLE;  Surgeon: Modena Nunnery, MD;  [facility];  Service: Gastroenterology    PR COLONOSCOPY W/BIOPSY SINGLE/MULTIPLE N/A 02/12/2019    Procedure: COLONOSCOPY, FLEXIBLE, PROXIMAL TO SPLENIC FLEXURE; WITH BIOPSY, SINGLE OR MULTIPLE;  Surgeon: Jules Husbands, MD;  Location: [facility];  Service: Gastroenterology    PR COLONOSCOPY W/BIOPSY SINGLE/MULTIPLE Left 05/15/2020    Procedure: COLONOSCOPY, FLEXIBLE, PROXIMAL TO SPLENIC FLEXURE; WITH BIOPSY, SINGLE OR MULTIPLE;  Surgeon: Rona Ravens, MD;  [facility];  Service: Gastroenterology    PR COLSC FLEXIBLE W/TRANSENDOSCOPIC BALLOON DILAT N/A 07/15/2014    Procedure: COLONOSCOPY, FLEXIBLE; WITH DILATION BY BALLOON, 1 OR MORE STRICTURES;  Surgeon: Janyth Pupa, MD;  Location: [facility];  Service: Gastroenterology    PR ESOPHGL BALO DISTENSION DX STD W/PROVOCATION N/A 11/18/2022    Procedure: ESOPHAGEAL BALLOON DISTENSION STUDY, DIAGNOSTIC WITH PROVOCATION;  Surgeon: Hunt Oris, MD;  Location: [facility];  Service: Gastroenterology    PR INJECTION,ONABOTULINUMTOXINA N/A 11/18/2022    Procedure: INJECTION ONABOTULINUMTOXINA 1 UNIT;  Surgeon: Hunt Oris, MD;  Location: [facility];  Service: Gastroenterology    PR REMVL COLON & TERM ILEUM W/ILEOCOLOSTOMY N/A 09/16/2016    Procedure: COLECTOMY, PARTIAL, WITH REMOVAL OF TERMINAL ILEUM WITH ILEOCOLOSTOMY;  Surgeon: Lady Gary, MD;  Location: MAIN OR [facility];  Service: Gastrointestinal    PR REPAIR BICEPS LONG TENDON Right 03/19/2019    Procedure: TENODESIS LONG TENDON BICEPS;  Surgeon: Gonzella Lex, MD;  Location: ASC OR [facility];  Service: Orthopedics    PR SHLDR ARTHROSCOP,PART ACROMIOPLAS Right 10/02/2018    Procedure: R16 ARTHROSCOPY, SHOULDER, SURGICAL; DECOMPRESS SUBACROMIAL SPACE W/PART ACROMIOPLASTY, Tamala Bari;  Surgeon: Gonzella Lex, MD;  Location: ASC OR [facility];  Service: Orthopedics    PR SHLDR ARTHROSCOP,SURG,W/ROTAT CUFF REPR Right 10/02/2018    Procedure: ARTHROSCOPY, SHOULDER, SURGICAL; WITH ROTATOR CUFF REPAIR;  Surgeon: Gonzella Lex, MD;  Location: ASC OR [facility];  Service: Orthopedics    PR SHLDR ARTHROSCOP,SURG,W/ROTAT CUFF REPR Right 03/19/2019    Procedure: R16 ARTHROSCOPY, SHOULDER, SURGICAL; WITH ROTATOR CUFF REPAIR;  Surgeon: Gonzella Lex, MD;  Location: ASC OR [facility];  Service: Orthopedics    PR UP GI ENDOSCOPY,BALL DIL,30MM N/A 11/18/2022    Procedure: UGI ENDO; W/BALLOON DILAT ESOPHAGUS (<30MM DIAM);  Surgeon: Hunt Oris, MD;  Location: [facility];  Service: Gastroenterology    PR UPPER GI  ENDOSCOPY,BIOPSY N/A 07/23/2012    Procedure: UGI ENDOSCOPY; WITH BIOPSY, SINGLE OR MULTIPLE;  Surgeon: Vickii Chafe, MD;  Location: [facility];  Service: Gastroenterology    PR UPPER GI ENDOSCOPY,DIAGNOSIS N/A 11/10/2012    Procedure: UGI ENDO, INCLUDE ESOPHAGUS, STOMACH, & DUODENUM &/OR JEJUNUM; DX W/WO COLLECTION SPECIMN, BY BRUSH OR WASH;  Surgeon: Malcolm Metro, MD;  Location: [facility];  Service: Gastroenterology    PR UPPER GI ENDOSCOPY,DIAGNOSIS N/A 07/21/2013    Procedure: UGI ENDO, INCLUDE ESOPHAGUS, STOMACH, & DUODENUM &/OR JEJUNUM; DX W/WO COLLECTION SPECIMN, BY BRUSH OR WASH;  Surgeon: Gwen Pounds, MD;  Location: [facility];  Service: Gastroenterology    PR UPPER GI ENDOSCOPY,DIAGNOSIS N/A 07/15/2014    Procedure: UGI ENDO, INCLUDE ESOPHAGUS, STOMACH, & DUODENUM &/OR JEJUNUM; DX W/WO COLLECTION SPECIMN, BY BRUSH OR WASH;  Surgeon: Janyth Pupa, MD;  Location: [facility];  Service: Gastroenterology    PR UPPER GI ENDOSCOPY,DIAGNOSIS N/A 10/18/2021    Procedure: UGI ENDO, INCLUDE ESOPHAGUS, STOMACH, & DUODENUM &/OR JEJUNUM; DX W/WO COLLECTION SPECIMN, BY BRUSH OR WASH;  Surgeon: Hunt Oris, MD;  Location: [facility]URES [facility];  Service: Gastroenterology    ROTATOR CUFF REPAIR Right 2020    SHOULDER SURGERY      SPINE SURGERY        Allergies Tramadol, Penicillins, Dilaudid [hydromorphone], and Morphine   Medications He has a current medication list which includes the following prescription(s): buspirone, carisoprodol, citalopram, cyclobenzaprine, dicyclomine, dronabinol, erythromycin, ferrous sulfate, gabapentin, hydrocodone-acetaminophen, hydroxyzine, loperamide, meloxicam, naloxone, olanzapine, ondansetron, oxycodone-acetaminophen, pantoprazole, prednisone, promethazine, sertraline, testosterone, therapeutic multivitamin, and ustekinumab.   Family History His family history includes Cancer in his father and maternal aunt; Hyperlipidemia in his father; No Known Problems in his brother, maternal grandfather, maternal grandmother, maternal uncle, paternal aunt, paternal grandfather, paternal grandmother, paternal uncle, and sister; Stroke in his mother.   Social History He reports that he quit smoking about 5 years ago. His smoking use included cigarettes. He started smoking about 19 years ago. He has a 18 pack-year smoking history. He has never used smokeless tobacco. He reports that he does not currently use alcohol. He reports that he does not currently use drugs after having used the following drugs: Marijuana.Home [address]  [place] [address]  Occupation:         Occupational History     Comment: not working     Social History     Social History Narrative    Information taken:  03/31/19        Born [place], [place]        Raised with grandmother        2 siblings- pt is the youngest    Brother lives in [place], [place]    Sister lives in [place], [place]        Married for 86yrs to [employer]        No children         Education - completed through 11th grade I got kicked out at 12th grade        Not working, on disability benefits- Crohn's disease            Exam:  The primary encounter diagnosis was Right foot pain. Diagnoses of Plantar fasciitis of right foot and Tendinitis of right foot were also pertinent to this visit.   Estimated body mass index is 21.62 kg/m?? as calculated from the following:  Height as of 11/18/22: 180.3 cm (5' 11).    Weight as of 11/18/22: 70.3 kg (155 lb).     Musculoskeletal   Skin is intact over the foot with no erythema, edema, or ecchymosis.  Patient reports tenderness over the medial calcaneal tuberosity.  Pain with calcaneal squeeze.  He is tender over the plantar arch. No plantar fibroma.  He has tenderness over the TMT articulations as well as over the MTP joints.  No calluses.  No pain with calcaneal squeeze.  Ankle motion is intact with some discomfort. he does have some tenderness over the peroneals and posterior tibialis.       Standing alignment mild planovalgus    Pulses well-perfused distally, 2+ DP and posterior tibial pulses    Swelling no swelling    Neurologic Sensation to light touch distally normal   Skin Benign, no lesions        Test Results  The primary encounter diagnosis was Right foot pain. Diagnoses of Plantar fasciitis of right foot and Tendinitis of right foot were also pertinent to this visit.  No results found for: A1C    No results found for: VITD    Imaging  Orders Placed This Encounter   Procedures    Orthopedics DME Order    XR Foot 3 Or More Views Right     Radiology studies were ordered and personally reviewed and interpreted by the encounter provider today..  Minimal degenerative change      DME ORDER:  Dx: M72.2, Plantar fasciitis of right foot  Body Location: Foot and Ankle  Foot and Ankle: Walking Boot- Short  Laterality: Right    DME Documentation: Lower Extremity - Ambulatory AFOs, The patient is ambulatory but has weakness and/or instability of his right lower extremity which requires stabilization from the semi-rigid/rigid orthosis to potentially improve their function.       , 8.5, 8.5

## 2022-11-25 ENCOUNTER — Ambulatory Visit: Payer: Medicare Other | Admitting: Anesthesiology

## 2022-11-26 MED ORDER — OLANZAPINE 2.5 MG TABLET
ORAL_TABLET | Freq: Every evening | ORAL | 0 refills | 30 days | Status: CP
Start: 2022-11-26 — End: ?

## 2022-11-27 MED ORDER — BUSPIRONE 10 MG TABLET
ORAL_TABLET | Freq: Three times a day (TID) | ORAL | 0 refills | 30 days
Start: 2022-11-27 — End: ?

## 2022-11-28 MED ORDER — BUSPIRONE 10 MG TABLET
ORAL_TABLET | 0 refills | 0 days
Start: 2022-11-28 — End: ?

## 2022-12-03 MED ORDER — BUSPIRONE 10 MG TABLET
ORAL_TABLET | Freq: Three times a day (TID) | ORAL | 0 refills | 0.00000 days | Status: CP
Start: 2022-12-03 — End: ?

## 2022-12-03 NOTE — Unmapped (Signed)
Buspar refilled today x 3 months per protocol.  Declining duplicate requests for refill.

## 2022-12-09 ENCOUNTER — Ambulatory Visit: Admit: 2022-12-09 | Discharge: 2022-12-10 | Payer: MEDICARE

## 2022-12-09 DIAGNOSIS — M722 Plantar fascial fibromatosis: Principal | ICD-10-CM

## 2022-12-09 NOTE — Unmapped (Signed)
[facility]  Encounter Provider: Arlyn Leak, PA  Date of Service: 12/09/2022     Primary Care Provider: Corky Mull, MD  Referring Provider: Info Unavailable -Eferr*    ICD-10-CM   1. Plantar fasciitis of right foot  M72.2    Orthopaedic notes: [id number]  Crohn's. Substance/Tob abuse.   H/o C6-7 ACDF  MR-C 01/2018 - Minimal L.C5-6 foram stenosis.  PROMIS Physical Function CAT:     PROMIS Pain Interference CAT:       History:  Chief Complaint   Patient presents with    Right Foot - Pain, Follow-up   (Data reviewed and verified by encounter provider.)  HPI:  52 y.o. male who presents to the office for evaluation of right foot pain.  He saw my colleague [name] NP on 11/20/22 for complaints of 2 months of right foot pain consistent with probable plantar fasciitis.  He has a history of chrons and is currently on disability.  He is complaining of severe pain along the plantar aspect of his foot.  He has been wearing a CAM boot which helps but once he removes the boot his pain is severe with weightbearing.  He denies any injury prior to the onset of his pain. No complaint of numbness or tingling.  He can't take NSAIDs because of his chrons.        Medical History Past Medical History:   Diagnosis Date    Acid reflux     Anxiety     Crohn's disease (CMS-HCC)     diagnosed in 1990    GERD (gastroesophageal reflux disease)     Hypertension 10/08/2016      Surgical History Past Surgical History:   Procedure Laterality Date    COLON SURGERY      PR COLONOSCOPY FLX DX W/COLLJ SPEC WHEN PFRMD Left 11/10/2012    Procedure: COLONOSCOPY, FLEXIBLE, PROXIMAL TO SPLENIC FLEXURE; DIAGNOSTIC, W/WO COLLECTION SPECIMEN BY BRUSH OR WASH;  Surgeon: Malcolm Metro, MD;  Location: [facility];  Service: Gastroenterology    PR COLONOSCOPY FLX DX W/COLLJ SPEC WHEN PFRMD N/A 07/21/2013    Procedure: COLONOSCOPY, FLEXIBLE, PROXIMAL TO SPLENIC FLEXURE; DIAGNOSTIC, W/WO COLLECTION SPECIMEN BY BRUSH OR WASH;  Surgeon: Gwen Pounds, MD;  [facility]: [facility];  Service: Gastroenterology    PR COLONOSCOPY FLX DX W/COLLJ SPEC WHEN PFRMD N/A 08/09/2016    Procedure: COLONOSCOPY, FLEXIBLE, PROXIMAL TO SPLENIC FLEXURE; DIAGNOSTIC, W/WO COLLECTION SPECIMEN BY BRUSH OR WASH;  Surgeon: Janyth Pupa, MD;  Location: [facility];  Service: Gastroenterology    PR COLONOSCOPY FLX DX W/COLLJ SPEC WHEN PFRMD N/A 04/06/2018    Procedure: COLONOSCOPY, FLEXIBLE, PROXIMAL TO SPLENIC FLEXURE; DIAGNOSTIC, W/WO COLLECTION SPECIMEN BY BRUSH OR WASH;  Surgeon: Zetta Bills, MD;  [facility];  Service: Gastroenterology    PR COLONOSCOPY FLX DX W/COLLJ SPEC WHEN PFRMD N/A 10/18/2021    Procedure: COLONOSCOPY, FLEXIBLE, PROXIMAL TO SPLENIC FLEXURE; DIAGNOSTIC, W/WO COLLECTION SPECIMEN BY BRUSH OR WASH;  Surgeon: Hunt Oris, MD;  Location: [facility];  Service: Gastroenterology    PR COLONOSCOPY W/BIOPSY SINGLE/MULTIPLE  07/23/2012    Procedure: COLONOSCOPY, FLEXIBLE, PROXIMAL TO SPLENIC FLEXURE; WITH BIOPSY, SINGLE OR MULTIPLE;  Surgeon: Vickii Chafe, MD;  Location: [facility];  Service: Gastroenterology    PR COLONOSCOPY W/BIOPSY SINGLE/MULTIPLE N/A 07/15/2014    Procedure: COLONOSCOPY, FLEXIBLE, PROXIMAL TO SPLENIC FLEXURE; WITH BIOPSY, SINGLE OR MULTIPLE;  Surgeon: Janyth Pupa, MD;  Location: [facility];  Service: Gastroenterology    PR COLONOSCOPY W/BIOPSY SINGLE/MULTIPLE N/A 03/21/2017    Procedure: COLONOSCOPY, FLEXIBLE, PROXIMAL TO SPLENIC FLEXURE; WITH BIOPSY, SINGLE OR MULTIPLE;  Surgeon: Modena Nunnery, MD;  [facility];  Service: Gastroenterology    PR COLONOSCOPY W/BIOPSY SINGLE/MULTIPLE N/A 02/12/2019    Procedure: COLONOSCOPY, FLEXIBLE, PROXIMAL TO SPLENIC FLEXURE; WITH BIOPSY, SINGLE OR MULTIPLE;  Surgeon: Jules Husbands, MD;  Location: [facility];  Service: Gastroenterology    PR COLONOSCOPY W/BIOPSY SINGLE/MULTIPLE Left 05/15/2020    Procedure: COLONOSCOPY, FLEXIBLE, PROXIMAL TO SPLENIC FLEXURE; WITH BIOPSY, SINGLE OR MULTIPLE;  Surgeon: Rona Ravens, MD;  [facility];  Service: Gastroenterology    PR COLSC FLEXIBLE W/TRANSENDOSCOPIC BALLOON DILAT N/A 07/15/2014    Procedure: COLONOSCOPY, FLEXIBLE; WITH DILATION BY BALLOON, 1 OR MORE STRICTURES;  Surgeon: Janyth Pupa, MD;  Location: [facility];  Service: Gastroenterology    PR ESOPHGL BALO DISTENSION DX STD W/PROVOCATION N/A 11/18/2022    Procedure: ESOPHAGEAL BALLOON DISTENSION STUDY, DIAGNOSTIC WITH PROVOCATION;  Surgeon: Hunt Oris, MD;  Location: [facility];  Service: Gastroenterology    PR INJECTION,ONABOTULINUMTOXINA N/A 11/18/2022    Procedure: INJECTION ONABOTULINUMTOXINA 1 UNIT;  Surgeon: Hunt Oris, MD;  Location: [facility];  Service: Gastroenterology    PR REMVL COLON & TERM ILEUM W/ILEOCOLOSTOMY N/A 09/16/2016    Procedure: COLECTOMY, PARTIAL, WITH REMOVAL OF TERMINAL ILEUM WITH ILEOCOLOSTOMY;  Surgeon: Lady Gary, MD;  Location: MAIN OR [facility];  Service: Gastrointestinal    PR REPAIR BICEPS LONG TENDON Right 03/19/2019    Procedure: TENODESIS LONG TENDON BICEPS;  Surgeon: Gonzella Lex, MD;  Location: ASC OR [facility];  Service: Orthopedics    PR SHLDR ARTHROSCOP,PART ACROMIOPLAS Right 10/02/2018    Procedure: R16 ARTHROSCOPY, SHOULDER, SURGICAL; DECOMPRESS SUBACROMIAL SPACE W/PART ACROMIOPLASTY, Tamala Bari;  Surgeon: Gonzella Lex, MD;  Location: ASC OR [facility];  Service: Orthopedics    PR SHLDR ARTHROSCOP,SURG,W/ROTAT CUFF REPR Right 10/02/2018    Procedure: ARTHROSCOPY, SHOULDER, SURGICAL; WITH ROTATOR CUFF REPAIR;  Surgeon: Gonzella Lex, MD;  Location: ASC OR [facility];  Service: Orthopedics    PR SHLDR ARTHROSCOP,SURG,W/ROTAT CUFF REPR Right 03/19/2019    Procedure: R16 ARTHROSCOPY, SHOULDER, SURGICAL; WITH ROTATOR CUFF REPAIR;  Surgeon: Gonzella Lex, MD;  Location: ASC OR [facility];  Service: Orthopedics    PR UP GI ENDOSCOPY,BALL DIL,30MM N/A 11/18/2022    Procedure: UGI ENDO; W/BALLOON DILAT ESOPHAGUS (<30MM DIAM);  Surgeon: Hunt Oris, MD;  Location: [facility];  Service: Gastroenterology    PR UPPER GI ENDOSCOPY,BIOPSY N/A 07/23/2012    Procedure: UGI ENDOSCOPY; WITH BIOPSY, SINGLE OR MULTIPLE;  Surgeon: Vickii Chafe, MD;  Location: [facility];  Service: Gastroenterology    PR UPPER GI ENDOSCOPY,DIAGNOSIS N/A 11/10/2012    Procedure: UGI ENDO, INCLUDE ESOPHAGUS, STOMACH, & DUODENUM &/OR JEJUNUM; DX W/WO COLLECTION SPECIMN, BY BRUSH OR WASH;  Surgeon: Malcolm Metro, MD;  Location: [facility];  Service: Gastroenterology    PR UPPER GI ENDOSCOPY,DIAGNOSIS N/A 07/21/2013    Procedure: UGI ENDO, INCLUDE ESOPHAGUS, STOMACH, & DUODENUM &/OR JEJUNUM; DX W/WO COLLECTION SPECIMN, BY BRUSH OR WASH;  Surgeon: Gwen Pounds, MD;  Location: [facility];  Service: Gastroenterology    PR UPPER GI ENDOSCOPY,DIAGNOSIS N/A 07/15/2014    Procedure: UGI ENDO, INCLUDE ESOPHAGUS, STOMACH, & DUODENUM &/  OR JEJUNUM; DX W/WO COLLECTION SPECIMN, BY BRUSH OR WASH;  Surgeon: Janyth Pupa, MD;  Location: [facility];  Service: Gastroenterology    PR UPPER GI ENDOSCOPY,DIAGNOSIS N/A 10/18/2021    Procedure: UGI ENDO, INCLUDE ESOPHAGUS, STOMACH, & DUODENUM &/OR JEJUNUM; DX W/WO COLLECTION SPECIMN, BY BRUSH OR WASH;  Surgeon: Hunt Oris, MD;  Location: [facility];  Service: Gastroenterology    ROTATOR CUFF REPAIR Right 2020    SHOULDER SURGERY      SPINE SURGERY        Allergies Tramadol, Penicillins, Dilaudid [hydromorphone], and Morphine   Medications He has a current medication list which includes the following prescription(s): buspirone, carisoprodol, citalopram, cyclobenzaprine, dicyclomine, dronabinol, erythromycin, ferrous sulfate, gabapentin, hydrocodone-acetaminophen, hydroxyzine, loperamide, meloxicam, naloxone, olanzapine, ondansetron, oxycodone-acetaminophen, pantoprazole, promethazine, sertraline, testosterone, therapeutic multivitamin, and ustekinumab.   Family History His family history includes Cancer in his father and maternal aunt; Hyperlipidemia in his father; No Known Problems in his brother, maternal grandfather, maternal grandmother, maternal uncle, paternal aunt, paternal grandfather, paternal grandmother, paternal uncle, and sister; Stroke in his mother.   Social History He reports that he quit smoking about 5 years ago. His smoking use included cigarettes. He started smoking about 19 years ago. He has a 18 pack-year smoking history. He has never used smokeless tobacco. He reports that he does not currently use alcohol. He reports that he does not currently use drugs after having used the following drugs: Marijuana.Home [address]  [place] [address]  Occupation:         Occupational History     Comment: not working     Social History     Social History Narrative    Information taken:  03/31/19        Born [place], [place]        Raised with grandmother        2 siblings- pt is the youngest    Brother lives in [place], [place]    Sister lives in [place], [place]        Married for 21yrs to [employer]        No children         Education - completed through 11th grade I got kicked out at 12th grade        Not working, on disability benefits- Crohn's disease            Exam:  The encounter diagnosis was Plantar fasciitis of right foot.   Estimated body mass index is 21.62 kg/m?? as calculated from the following:    Height as of 11/18/22: 180.3 cm (5' 11).    Weight as of 11/18/22: 70.3 kg (155 lb).     Musculoskeletal   Right foot    No erythema, no warmth.   Severer tenderness at the plantar fascia insertion at the calcaneus  Mild discomfort with full DF of the foot            Pulses well-perfused distally, 2+ DP and posterior tibial pulses    Swelling no swelling    Neurologic Sensation to light touch distally normal   Skin Benign, no lesions        Test Results  The encounter diagnosis was Plantar fasciitis of right foot.  No results found for: A1C    No results found for: VITD    Imaging  Orders Placed This Encounter   Procedures    Tendon Shealth Injection: right plantar fascia  No new films today.           Rodney Bender is a 52 y.o. male   ASSESSMENT   Presents for revaluation of right foot pain consistent with plantar fasciitis.       ICD-10-CM   1. Plantar fasciitis of right foot  M72.2    Orthopaedic notes: [id number]  Crohn's. Substance/Tob abuse.   H/o C6-7 ACDF  MR-C 01/2018 - Minimal L.C5-6 foram stenosis.  PROMIS Physical Function CAT:     PROMIS Pain Interference CAT:     [email or web address]       PLAN:       The patients symptoms are consistent with plantar fasciitis.  HE has been wearing a CAM boot which I recommended he continue to wear for the next 1-2 weeks.  Discussed treatment options and recommendation is for a corticosteroid injection since he is unable to take NSAID medications.   He was provided with home stretches, I recommended gel heel cups and a night time splint.  We also discussed proper shoe wear.  He understands these symptoms may take several weeks to months to resolve.  Follow up as needed.      Tendon Shealth Injection: right plantar fascia on 12/09/2022 2:20 PM  Laterality: right    Medications: 3 mg betamethasone acetate-betamethasone sodium phosphate 6 mg/mL          Requested Prescriptions      No prescriptions requested or ordered in this encounter      Orders Placed This Encounter   Procedures    Tendon Shealth Injection: right plantar fascia       MDM (Two highest determine E&M code)   Problems 1 acute, uncomplicated illness or injury (30865, 99213)   Data [Use LOS Wand to tabulate number of outside records reviewed, ordering tests, reviewing tests; use of independent historian; discussion with other provider]   Risk Low risk of morbidity from additional diagnostic testing or treatment (99203/99213)

## 2022-12-09 NOTE — Unmapped (Signed)
Thank-you for coming to [facility]      -frequent stretching (multiple times every day)  -roll foot over frozen water bottle or tennis ball  -Voltaren gel (over the counter topical anti-inflammatory)  -night splint      -heel cups    -supportive shoes every step of the day  -consider [place]               Plantar Fasciitis: Exercises  Introduction  Here are some examples of exercises for you to try. The exercises may be suggested for a condition or for rehabilitation. Start each exercise slowly. Ease off the exercises if you start to have pain.  You will be told when to start these exercises and which ones will work best for you.  How to do the exercises  Towel stretch    Sit with your legs extended and knees straight.  Place a towel around your foot just under the toes.  Hold each end of the towel in each hand, with your hands above your knees.  Pull back with the towel so that your foot stretches toward you.  Hold the position for at least 15 to 30 seconds.  Repeat 2 to 4 times a session, up to 5 sessions a day.  Calf stretch    This exercise stretches the muscles at the back of the lower leg (the calf) and the Achilles tendon. Do this exercise 3 or 4 times a day, 5 days a week.  Stand facing a wall with your hands on the wall at about eye level. Put the leg you want to stretch about a step behind your other leg.  Keeping your back heel on the floor, bend your front knee until you feel a stretch in the back leg.  Hold the stretch for 15 to 30 seconds. Repeat 2 to 4 times.  Plantar fascia and calf stretch    Stretching the plantar fascia and calf muscles can increase flexibility and decrease heel pain. You can do this exercise several times each day and before and after activity.  Stand on a step as shown above. Be sure to hold on to the banister.  Slowly let your heels down over the edge of the step as you relax your calf muscles. You should feel a gentle stretch across the bottom of your foot and up the back of your leg to your knee.  Hold the stretch about 15 to 30 seconds, and then tighten your calf muscle a little to bring your heel back up to the level of the step. Repeat 2 to 4 times.  Towel curls    Make this exercise more challenging by placing a weighted object, such as a soup can, on the other end of the towel.  While sitting, place your foot on a towel on the floor and scrunch the towel toward you with your toes.  Then, also using your toes, push the towel away from you.  Marble pickups    Put marbles on the floor next to a cup.  Using your toes, try to lift the marbles up from the floor and put them in the cup.  Follow-up care is a key part of your treatment and safety. Be sure to make and go to all appointments, and call your doctor if you are having problems. It's also a good idea to know your test results and keep a list of the medicines you take.  Current as of: December 06, 2020  Content Version: 13.6  ?? 2006-2023 [employer].   Care instructions adapted under license by [facility]. If you have questions about a medical condition or this instruction, always ask your healthcare professional. [employer] disclaims any warranty or liability for your use of this information.        Contact my clinical staff, at [phone] for any questions/concerns. Voicemail is checked Monday-Friday from 8:00 am- 4:00 pm      To schedule an appointment with Melonie Florida, PA-C at [facility] please call: [phone]     MyChart messages: These messages can be sent to your provider and will be checked by their clinical support staff.? The messages are checked throughout the day during normal business hours from 8:30 am-4:00 pm Monday-Friday, however responses may take up to 48 hours.? Please use this method of communication for non-urgent and non-emergent concerns, questions, refill requests or inquiries only.? ?Our team will help respond to all of your questions.? Please note that you may be asked to see a provider by either a telehealth or in person visit if it is deemed your questions are best handled in the clinic setting in person.??  ?  Please keep in mind, these messages are not real time communications, so be patient when waiting for a response.  ?  If you have an issue that requires emergent attention that cannot wait; either call the Orthopaedics resident on call at [phone], consider coming to our [facility] walk-in clinic, or go to the nearest Emergency Department.    We look forward to seeing you again in the future and appreciate you choosing [place] for your care!

## 2022-12-11 MED ORDER — DRONABINOL 10 MG CAPSULE
ORAL_CAPSULE | 0 refills | 0 days
Start: 2022-12-11 — End: ?

## 2022-12-13 MED ORDER — DRONABINOL 10 MG CAPSULE
ORAL_CAPSULE | Freq: Two times a day (BID) | ORAL | 0 refills | 30 days | Status: CP
Start: 2022-12-13 — End: ?

## 2022-12-17 ENCOUNTER — Ambulatory Visit: Admit: 2022-12-17 | Discharge: 2022-12-18 | Payer: MEDICARE

## 2022-12-17 NOTE — Unmapped (Signed)
[facility]  Encounter Provider: Arlyn Leak, PA  Date of Service: 12/17/2022     Primary Care Provider: Corky Mull, MD  Referring Provider: Referred Self  No diagnosis found.   Orthopaedic notes: [id number]  Crohn's. Substance/Tob abuse.   H/o C6-7 ACDF  MR-C 01/2018 - Minimal L.C5-6 foram stenosis.  PROMIS Physical Function CAT:     PROMIS Pain Interference CAT:       History:  No chief complaint on file.  (Data reviewed and verified by encounter provider.)  HPI:  52 y.o. male who presents to the office for re-evaluation of right foot pain.  I saw him one week ago where a plantar fascia injection was performed. He states that the injection did not help. He has remained in the CAM boot which is the only thing that helps to provide some pain relief.  He continues to complain of the feeling of a golf ball along the plantar surface of his foot and severe pain in his heel. No new injury.        Medical History Past Medical History:   Diagnosis Date    Acid reflux     Anxiety     Crohn's disease (CMS-HCC)     diagnosed in 1990    GERD (gastroesophageal reflux disease)     Hypertension 10/08/2016      Surgical History Past Surgical History:   Procedure Laterality Date    COLON SURGERY      PR COLONOSCOPY FLX DX W/COLLJ SPEC WHEN PFRMD Left 11/10/2012    Procedure: COLONOSCOPY, FLEXIBLE, PROXIMAL TO SPLENIC FLEXURE; DIAGNOSTIC, W/WO COLLECTION SPECIMEN BY BRUSH OR WASH;  Surgeon: Malcolm Metro, MD;  Location: [facility];  Service: Gastroenterology    PR COLONOSCOPY FLX DX W/COLLJ SPEC WHEN PFRMD N/A 07/21/2013    Procedure: COLONOSCOPY, FLEXIBLE, PROXIMAL TO SPLENIC FLEXURE; DIAGNOSTIC, W/WO COLLECTION SPECIMEN BY BRUSH OR WASH;  Surgeon: Gwen Pounds, MD;  [facility]: [facility];  Service: Gastroenterology    PR COLONOSCOPY FLX DX W/COLLJ SPEC WHEN PFRMD N/A 08/09/2016    Procedure: COLONOSCOPY, FLEXIBLE, PROXIMAL TO SPLENIC FLEXURE; DIAGNOSTIC, W/WO COLLECTION SPECIMEN BY BRUSH OR WASH;  Surgeon: Janyth Pupa, MD;  Location: [facility];  Service: Gastroenterology    PR COLONOSCOPY FLX DX W/COLLJ SPEC WHEN PFRMD N/A 04/06/2018    Procedure: COLONOSCOPY, FLEXIBLE, PROXIMAL TO SPLENIC FLEXURE; DIAGNOSTIC, W/WO COLLECTION SPECIMEN BY BRUSH OR WASH;  Surgeon: Zetta Bills, MD;  [facility];  Service: Gastroenterology    PR COLONOSCOPY FLX DX W/COLLJ SPEC WHEN PFRMD N/A 10/18/2021    Procedure: COLONOSCOPY, FLEXIBLE, PROXIMAL TO SPLENIC FLEXURE; DIAGNOSTIC, W/WO COLLECTION SPECIMEN BY BRUSH OR WASH;  Surgeon: Hunt Oris, MD;  Location: [facility];  Service: Gastroenterology    PR COLONOSCOPY W/BIOPSY SINGLE/MULTIPLE  07/23/2012    Procedure: COLONOSCOPY, FLEXIBLE, PROXIMAL TO SPLENIC FLEXURE; WITH BIOPSY, SINGLE OR MULTIPLE;  Surgeon: Vickii Chafe, MD;  Location: [facility];  Service: Gastroenterology    PR COLONOSCOPY W/BIOPSY SINGLE/MULTIPLE N/A 07/15/2014    Procedure: COLONOSCOPY, FLEXIBLE, PROXIMAL TO SPLENIC FLEXURE; WITH BIOPSY, SINGLE OR MULTIPLE;  Surgeon: Janyth Pupa, MD;  Location: [facility];  Service: Gastroenterology    PR COLONOSCOPY W/BIOPSY SINGLE/MULTIPLE N/A 03/21/2017    Procedure: COLONOSCOPY, FLEXIBLE, PROXIMAL TO SPLENIC FLEXURE; WITH BIOPSY, SINGLE OR MULTIPLE;  Surgeon: Modena Nunnery, MD;  Location: [facility] [place] [facility];  Service: Gastroenterology  PR COLONOSCOPY W/BIOPSY SINGLE/MULTIPLE N/A 02/12/2019    Procedure: COLONOSCOPY, FLEXIBLE, PROXIMAL TO SPLENIC FLEXURE; WITH BIOPSY, SINGLE OR MULTIPLE;  Surgeon: Jules Husbands, MD;  Location: [facility];  Service: Gastroenterology    PR COLONOSCOPY W/BIOPSY SINGLE/MULTIPLE Left 05/15/2020    Procedure: COLONOSCOPY, FLEXIBLE, PROXIMAL TO SPLENIC FLEXURE; WITH BIOPSY, SINGLE OR MULTIPLE;  Surgeon: Rona Ravens, MD; Location[facility];  Service: Gastroenterology    PR COLSC FLEXIBLE W/TRANSENDOSCOPIC BALLOON DILAT N/A 07/15/2014    Procedure: COLONOSCOPY, FLEXIBLE; WITH DILATION BY BALLOON, 1 OR MORE STRICTURES;  Surgeon: Janyth Pupa, MD;  Location: [facility];  Service: Gastroenterology    PR ESOPHGL BALO DISTENSION DX STD W/PROVOCATION N/A 11/18/2022    Procedure: ESOPHAGEAL BALLOON DISTENSION STUDY, DIAGNOSTIC WITH PROVOCATION;  Surgeon: Hunt Oris, MD;  Location: [facility];  Service: Gastroenterology    PR INJECTION,ONABOTULINUMTOXINA N/A 11/18/2022    Procedure: INJECTION ONABOTULINUMTOXINA 1 UNIT;  Surgeon: Hunt Oris, MD;  Location: [facility];  Service: Gastroenterology    PR REMVL COLON & TERM ILEUM W/ILEOCOLOSTOMY N/A 09/16/2016    Procedure: COLECTOMY, PARTIAL, WITH REMOVAL OF TERMINAL ILEUM WITH ILEOCOLOSTOMY;  Surgeon: Lady Gary, MD;  Location: MAIN OR [facility];  Service: Gastrointestinal    PR REPAIR BICEPS LONG TENDON Right 03/19/2019    Procedure: TENODESIS LONG TENDON BICEPS;  Surgeon: Gonzella Lex, MD;  Location: ASC OR [facility];  Service: Orthopedics    PR SHLDR ARTHROSCOP,PART ACROMIOPLAS Right 10/02/2018    Procedure: R16 ARTHROSCOPY, SHOULDER, SURGICAL; DECOMPRESS SUBACROMIAL SPACE W/PART ACROMIOPLASTY, Tamala Bari;  Surgeon: Gonzella Lex, MD;  Location: ASC OR [facility];  Service: Orthopedics    PR SHLDR ARTHROSCOP,SURG,W/ROTAT CUFF REPR Right 10/02/2018    Procedure: ARTHROSCOPY, SHOULDER, SURGICAL; WITH ROTATOR CUFF REPAIR;  Surgeon: Gonzella Lex, MD;  Location: ASC OR [facility];  Service: Orthopedics    PR SHLDR ARTHROSCOP,SURG,W/ROTAT CUFF REPR Right 03/19/2019    Procedure: R16 ARTHROSCOPY, SHOULDER, SURGICAL; WITH ROTATOR CUFF REPAIR;  Surgeon: Gonzella Lex, MD;  Location: ASC OR [facility];  Service: Orthopedics    PR UP GI ENDOSCOPY,BALL DIL,30MM N/A 11/18/2022    Procedure: UGI ENDO; W/BALLOON DILAT ESOPHAGUS (<30MM DIAM);  Surgeon: Hunt Oris, MD;  Location: [facility];  Service: Gastroenterology    PR UPPER GI ENDOSCOPY,BIOPSY N/A 07/23/2012    Procedure: UGI ENDOSCOPY; WITH BIOPSY, SINGLE OR MULTIPLE;  Surgeon: Vickii Chafe, MD;  Location: [facility];  Service: Gastroenterology    PR UPPER GI ENDOSCOPY,DIAGNOSIS N/A 11/10/2012    Procedure: UGI ENDO, INCLUDE ESOPHAGUS, STOMACH, & DUODENUM &/OR JEJUNUM; DX W/WO COLLECTION SPECIMN, BY BRUSH OR WASH;  Surgeon: Malcolm Metro, MD;  Location: [facility];  Service: Gastroenterology    PR UPPER GI ENDOSCOPY,DIAGNOSIS N/A 07/21/2013    Procedure: UGI ENDO, INCLUDE ESOPHAGUS, STOMACH, & DUODENUM &/OR JEJUNUM; DX W/WO COLLECTION SPECIMN, BY BRUSH OR WASH;  Surgeon: Gwen Pounds, MD;  Location: [facility];  Service: Gastroenterology    PR UPPER GI ENDOSCOPY,DIAGNOSIS N/A 07/15/2014    Procedure: UGI ENDO, INCLUDE ESOPHAGUS, STOMACH, & DUODENUM &/OR JEJUNUM; DX W/WO COLLECTION SPECIMN, BY BRUSH OR WASH;  Surgeon: Janyth Pupa, MD;  Location: [facility];  Service: Gastroenterology    PR UPPER GI ENDOSCOPY,DIAGNOSIS N/A 10/18/2021    Procedure: UGI ENDO, INCLUDE ESOPHAGUS, STOMACH, & DUODENUM &/OR JEJUNUM; DX W/WO COLLECTION SPECIMN, BY BRUSH OR WASH;  Surgeon:  Hunt Oris, MD;  Location: [facility];  Service: Gastroenterology    ROTATOR CUFF REPAIR Right 2020    SHOULDER SURGERY      SPINE SURGERY        Allergies Tramadol, Penicillins, Dilaudid [hydromorphone], and Morphine   Medications He has a current medication list which includes the following prescription(s): buspirone, carisoprodol, citalopram, cyclobenzaprine, dicyclomine, dronabinol, erythromycin, ferrous sulfate, gabapentin, hydrocodone-acetaminophen, hydroxyzine, loperamide, meloxicam, naloxone, olanzapine, ondansetron, oxycodone-acetaminophen, pantoprazole, promethazine, sertraline, testosterone, therapeutic multivitamin, and ustekinumab.   Family History His family history includes Cancer in his father and maternal aunt; Hyperlipidemia in his father; No Known Problems in his brother, maternal grandfather, maternal grandmother, maternal uncle, paternal aunt, paternal grandfather, paternal grandmother, paternal uncle, and sister; Stroke in his mother.   Social History He reports that he quit smoking about 5 years ago. His smoking use included cigarettes. He started smoking about 19 years ago. He has a 18 pack-year smoking history. He has never used smokeless tobacco. He reports that he does not currently use alcohol. He reports that he does not currently use drugs after having used the following drugs: Marijuana.Home [address]  [place] [address]  Occupation:         Occupational History     Comment: not working     Social History     Social History Narrative    Information taken:  03/31/19        Born [place], [place]        Raised with grandmother        2 siblings- pt is the youngest    Brother lives in [place], [place]    Sister lives in [place], [place]        Married for 64yrs to [employer]        No children         Education - completed through 11th grade I got kicked out at 12th grade        Not working, on disability benefits- Crohn's disease            Exam:  There were no encounter diagnoses.   Estimated body mass index is 21.62 kg/m?? as calculated from the following:    Height as of 11/18/22: 180.3 cm (5' 11).    Weight as of 11/18/22: 70.3 kg (155 lb).     Musculoskeletal   Right foot    No new exam performed today           Pulses well-perfused distally, 2+ DP and posterior tibial pulses    Swelling no swelling    Neurologic Sensation to light touch distally normal   Skin Benign, no lesions        Test Results  There were no encounter diagnoses.  No results found for: A1C    No results found for: VITD    Imaging  No orders of the defined types were placed in this encounter.    No new films today.           Elizer Szymborski is a 52 y.o. male   ASSESSMENT   Presents for revaluation of right foot pain consistent with plantar fasciitis.     No diagnosis found.   Orthopaedic notes: [id number]  Crohn's. Substance/Tob abuse.   H/o C6-7 ACDF  MR-C 01/2018 - Minimal L.C5-6 foram stenosis.  PROMIS Physical Function CAT:     PROMIS Pain Interference CAT:     [email or web address]       PLAN:  The patient is not interested in discussing any further conservative treatment or [place].  He states he has a friend who had plantar fascia surgery and he is requesting surgical treatment.  Per his request he will be scheduled with one of our surgeons for discussion of treatment options.  I advised him that he may get some relief from the injection and it might just take more time.     Requested Prescriptions      No prescriptions requested or ordered in this encounter      No orders of the defined types were placed in this encounter.      MDM (Two highest determine E&M code)   Problems 1 acute, uncomplicated illness or injury (40102, [phone])   Data [Use LOS Wand to tabulate number of outside records reviewed, ordering tests, reviewing tests; use of independent historian; discussion with other provider]   Risk Low risk of morbidity from additional diagnostic testing or treatment (99203/99213)

## 2022-12-17 NOTE — Unmapped (Signed)
Called patient and left a voicemail stating that the injection given 1 week ago still might need time to take affect.  If he has no improvement I recommended a follow up with non op sports for further evaluation.

## 2022-12-18 ENCOUNTER — Encounter: Payer: Self-pay | Admitting: Anesthesiology

## 2022-12-18 ENCOUNTER — Ambulatory Visit: Payer: Medicare Other | Attending: Anesthesiology | Admitting: Anesthesiology

## 2022-12-18 DIAGNOSIS — K5090 Crohn's disease, unspecified, without complications: Secondary | ICD-10-CM

## 2022-12-18 DIAGNOSIS — F1190 Opioid use, unspecified, uncomplicated: Secondary | ICD-10-CM

## 2022-12-18 DIAGNOSIS — G8929 Other chronic pain: Secondary | ICD-10-CM

## 2022-12-18 DIAGNOSIS — M47816 Spondylosis without myelopathy or radiculopathy, lumbar region: Secondary | ICD-10-CM

## 2022-12-18 DIAGNOSIS — R200 Anesthesia of skin: Secondary | ICD-10-CM

## 2022-12-18 DIAGNOSIS — G894 Chronic pain syndrome: Secondary | ICD-10-CM | POA: Diagnosis not present

## 2022-12-18 DIAGNOSIS — M542 Cervicalgia: Secondary | ICD-10-CM | POA: Diagnosis not present

## 2022-12-18 DIAGNOSIS — M5412 Radiculopathy, cervical region: Secondary | ICD-10-CM

## 2022-12-18 DIAGNOSIS — Z79891 Long term (current) use of opiate analgesic: Secondary | ICD-10-CM

## 2022-12-18 DIAGNOSIS — R109 Unspecified abdominal pain: Secondary | ICD-10-CM | POA: Diagnosis not present

## 2022-12-18 DIAGNOSIS — M549 Dorsalgia, unspecified: Secondary | ICD-10-CM

## 2022-12-18 MED ORDER — OXYCODONE-ACETAMINOPHEN 10-325 MG PO TABS: 1.0000 | ORAL_TABLET | Freq: Four times a day (QID) | ORAL | 0 refills | Status: AC | PRN

## 2022-12-18 MED ORDER — OXYCODONE-ACETAMINOPHEN 10-325 MG PO TABS: 1.0000 | ORAL_TABLET | Freq: Four times a day (QID) | ORAL | 0 refills | Status: DC | PRN

## 2022-12-18 MED ORDER — DRONABINOL 10 MG CAPSULE
ORAL_CAPSULE | Freq: Two times a day (BID) | ORAL | 0 refills | 30 days
Start: 2022-12-18 — End: ?

## 2022-12-18 NOTE — Progress Notes (Signed)
Virtual Visit via Telephone Note  I connected with Benjamin Murillo on 12/18/22 at  1:20 PM EST by telephone and verified that I am speaking with the correct person using two identifiers.  Location: Patient: Home Provider: Pain control center   I discussed the limitations, risks, security and privacy concerns of performing an evaluation and management service by telephone and the availability of in person appointments. I also discussed with the patient that there may be a patient responsible charge related to this service. The patient expressed understanding and agreed to proceed.   History of Present Illness: I was able to reach Benjamin Murillo for our virtual visit.  We did this via telephone as we could not like for the video portion of the conference.  He is doing reasonably well with his neck and back pain.  He still act out but he takes his Percocet 3 times a day and this keeps this pain under control.  He has failed more conservative therapy but the medications enable him to continue to stay active functional and sleep better at night.  Without the medications he is unable to do this.  He does note that he is having more problems with his abdominal and esophageal spasm issues and currently is under review with his gastroenterologist for this.  He denies change in the quality characteristic or distribution of his neck pain or back pain.  No new change in upper or lower extremity strength or function is noted bowel and bladder function has been stable as well.  Review of systems: General: No fevers or chills Pulmonary: No shortness of breath or dyspnea Cardiac: No angina or palpitations or lightheadedness GI: No abdominal pain or constipation Psych: No depression    Observations/Objective:  Current Outpatient Medications:    [START ON 01/24/2023] oxyCODONE-acetaminophen (PERCOCET) 10-325 MG tablet, Take 1 tablet by mouth every 6 (six) hours as needed for pain., Disp: 90 tablet, Rfl: 0    amlodipine-atorvastatin (CADUET) 10-10 MG tablet, Take 1 tablet by mouth daily., Disp: , Rfl:    citalopram (CELEXA) 10 MG tablet, Take 10 mg by mouth daily as needed., Disp: , Rfl:    dronabinol (MARINOL) 10 MG capsule, Take 10 mg by mouth 2 (two) times daily before a meal., Disp: , Rfl:    erythromycin ophthalmic ointment, Place 1 Application into the left eye at bedtime., Disp: 3.5 g, Rfl: 0   famotidine (PEPCID) 20 MG tablet, Take 20 mg by mouth 2 (two) times daily., Disp: , Rfl:    gabapentin (NEURONTIN) 100 MG capsule, Take 1 capsule (100 mg total) by mouth at bedtime., Disp: 30 capsule, Rfl: 3   gabapentin (NEURONTIN) 300 MG capsule, Take 1 capsule (300 mg total) by mouth 3 (three) times daily., Disp: 90 capsule, Rfl: 3   hyoscyamine (LEVSIN, ANASPAZ) 0.125 MG tablet, Take 0.125 mg by mouth as needed. , Disp: , Rfl:    loperamide (IMODIUM A-D) 2 MG tablet, Take 2 mg by mouth as needed., Disp: , Rfl:    naloxone (NARCAN) nasal spray 4 mg/0.1 mL, For respiratory depression from opioids, Disp: 1 kit, Rfl: 2   [START ON 12/23/2022] oxyCODONE-acetaminophen (PERCOCET) 10-325 MG tablet, Take 1 tablet by mouth every 6 (six) hours as needed for pain., Disp: 90 tablet, Rfl: 0   predniSONE (STERAPRED UNI-PAK 21 TAB) 10 MG (21) TBPK tablet, 6 tablets day one and day two, five tablets day three and day four, and daily taper over 12days as directed, Disp: 42 tablet, Rfl:  0   prochlorperazine (COMPAZINE) 5 MG tablet, Take 5 mg by mouth every 8 (eight) hours as needed., Disp: , Rfl:    STELARA 90 MG/ML SOSY injection, Inject 90 mg as directed., Disp: , Rfl:    Past Medical History:  Diagnosis Date   Cervical radiculitis 12/22/2019   Cervical radiculopathy    Chronic abdominal pain    Chronic neck pain    Cold    Crohn disease (HCC)    GERD (gastroesophageal reflux disease)    no meds-2-3 times weekly   Hand fracture, left    Headache(784.0)    Hypertension    no meds    IBS (irritable bowel  syndrome)    Shortness of breath    "sometimes laying down; sometimes w/activity" (10/13/2012)   Assessment and Plan:  1. Cervicalgia   2. Crohn's disease without complication, unspecified gastrointestinal tract location (HCC)   3. Chronic pain syndrome   4. Chronic, continuous use of opioids   5. Chronic abdominal pain   6. Facet arthritis of lumbar region   7. Chronic neck and back pain   8. Cervical radiculitis   9. Right arm numbness    Based on our conversation after review of the [facility] practitioner database information I think it is appropriate to refill his medicines dated 02 December 2023 and December 27.  The November 25 will be an early refill secondary to some travel.  I think this is appropriate.  I have encouraged him to continue with his stretching strengthening exercises for both his neck and low back.  No other changes in his pharmacologic regimen will be initiated.  Continue follow-up with gastroenterology and his primary care physicians for baseline medical care with a schedule return to clinic in 2 months. Follow Up Instructions:    I discussed the assessment and treatment plan with the patient. The patient was provided an opportunity to ask questions and all were answered. The patient agreed with the plan and demonstrated an understanding of the instructions.   The patient was advised to call back or seek an in-person evaluation if the symptoms worsen or if the condition fails to improve as anticipated.  I provided 30 minutes of non-face-to-face time during this encounter.   Yevette Edwards, MD

## 2022-12-23 MED ORDER — OLANZAPINE 2.5 MG TABLET
ORAL_TABLET | Freq: Every evening | ORAL | 0 refills | 0 days
Start: 2022-12-23 — End: ?

## 2022-12-24 MED ORDER — OLANZAPINE 2.5 MG TABLET
ORAL_TABLET | Freq: Every evening | ORAL | 0 refills | 30 days | Status: CP
Start: 2022-12-24 — End: ?

## 2022-12-24 NOTE — Unmapped (Signed)
[facility] Clinical Assessment & Refill Coordination Note    Rodney Bender [place], [place]: 1970/08/28  Phone: [phone] (home)     All above HIPAA information was verified with patient.     Was a Nurse, learning disability used for this call? No    Specialty Medication(s):   Inflammatory Disorders: Stelara     Current Outpatient Medications   Medication Sig Dispense Refill    busPIRone (BUSPAR) 10 MG tablet Take 2 tablets (20 mg total) by mouth Three (3) times a day. 180 tablet 2    carisoprodol (SOMA) 350 MG tablet       citalopram (CELEXA) 40 MG tablet Take 1 tablet (40 mg total) by mouth daily. 30 tablet 11    cyclobenzaprine (FLEXERIL) 5 MG tablet       dicyclomine (BENTYL) 20 mg tablet       dronabinol (MARINOL) 10 MG capsule Take 1 capsule (10 mg total) by mouth Two (2) times a day (30 minutes before a meal). 60 capsule 0    erythromycin (ROMYCIN) 5 mg/gram (0.5 %) ophthalmic ointment Apply 1 application. (1 Application total) to eye.      ferrous sulfate 325 (65 FE) MG tablet TAKE 1 TABLET BY MOUTH EVERY OTHER DAY 60 tablet 0    gabapentin (NEURONTIN) 300 MG capsule TAKE 2 CAPSULES BY MOUTH THREE TIMES DAILY 180 capsule 5    HYDROcodone-acetaminophen (NORCO) 5-325 mg per tablet take 1 to 2 tablets by mouth every 6 hours if needed      hydrOXYzine (ATARAX) 25 MG tablet Take 1 tablet (25 mg total) by mouth Three (3) times a day as needed for anxiety. 90 tablet 3    loperamide (IMODIUM A-D) 2 mg tablet Take 1 tablet (2 mg total) by mouth daily as needed.      meloxicam (MOBIC) 7.5 MG tablet Take 1 tablet twice a day by oral route with meals.      naloxone (NARCAN) 4 mg nasal spray       OLANZapine (ZYPREXA) 2.5 MG tablet Take 1 tablet by mouth nightly 30 tablet 0    ondansetron (ZOFRAN-ODT) 4 MG disintegrating tablet Dissolve 2 tablets (8 mg total) in the mouth every eight (8) hours as needed for nausea. 45 tablet 11    oxyCODONE-acetaminophen (PERCOCET) 10-325 mg per tablet Take 1 tablet by mouth every eight (8) hours as needed.      pantoprazole (PROTONIX) 40 MG tablet Take 1 tablet (40 mg total) by mouth Two (2) times a day (30 minutes before a meal). 180 tablet 3    promethazine (PHENERGAN) 25 MG tablet Take 2 tablets (50 mg total) by mouth every six (6) hours as needed for nausea. 45 tablet 11    sertraline (ZOLOFT) 50 MG tablet       testosterone 20.25 mg/1.25 gram (1.62 %) gel pump Place 2 sprays (40.5 mg total) on the skin daily. 75 g 5    therapeutic multivitamin (THERAGRAN) tablet Take 1 tablet by mouth daily. 90 tablet 3    ustekinumab (STELARA) 90 mg/mL Syrg syringe Inject the contents of 1 syringe (90 mg total) under the skin every 8 weeks. 1 mL 5     No current facility-administered medications for this visit.        Changes to medications: Garvie reports no changes at this time.    Allergies   Allergen Reactions    Tramadol Itching and Anxiety     Other reaction(s): Other (See Comments)  Penicillins      Other reaction(s): Itching-Allergy    Dilaudid [Hydromorphone] Anxiety    Morphine Itching     High doses       Changes to allergies: No    SPECIALTY MEDICATION ADHERENCE     Stelara 90 mg/ml: 0 doses of medicine on hand   Medication Adherence    Patient reported X missed doses in the last month: 0  Specialty Medication: Stelara 90mg /mL -1q  Patient is on additional specialty medications: No  Informant: patient          Specialty medication(s) dose(s) confirmed: Regimen is correct and unchanged.     Are there any concerns with adherence? No    Adherence counseling provided? Not needed    CLINICAL MANAGEMENT AND INTERVENTION      Clinical Benefit Assessment:    Do you feel the medicine is effective or helping your condition? Yes    Clinical Benefit counseling provided? Not needed    Adverse Effects Assessment:    Are you experiencing any side effects? No    Are you experiencing difficulty administering your medicine? No    Quality of Life Assessment:    Quality of Life Rheumatology  Oncology  Dermatology  Cystic Fibrosis          How many days over the past month did your Crohn's  keep you from your normal activities? For example, brushing your teeth or getting up in the morning. 0    Have you discussed this with your provider? Not needed    Acute Infection Status:    Acute infections noted within Epic:  No active infections  Patient reported infection: None    Therapy Appropriateness:    Is therapy appropriate based on current medication list, adverse reactions, adherence, clinical benefit and progress toward achieving therapeutic goals? Yes, therapy is appropriate and should be continued     DISEASE/MEDICATION-SPECIFIC INFORMATION      For patients on injectable medications: Patient currently has 0 doses left.  Next injection is scheduled for 12/14.    Chronic Inflammatory Diseases: Have you experienced any flares in the last month? No  Has this been reported to your provider? Not applicable    PATIENT SPECIFIC NEEDS     Does the patient have any physical, cognitive, or cultural barriers? No    Is the patient high risk? No    Did the patient require a clinical intervention? No    Does the patient require physician intervention or other additional services (i.e., nutrition, smoking cessation, social work)? No    SOCIAL DETERMINANTS OF HEALTH     At the [facility], we have learned that life circumstances - like trouble affording food, housing, utilities, or transportation can affect the health of many of our patients.   That is why we wanted to ask: are you currently experiencing any life circumstances that are negatively impacting your health and/or quality of life? No    Social Drivers of Health     Food Insecurity: Food Insecurity Present (04/03/2021)    Hunger Vital Sign     Worried About Running Out of Food in the Last Year: Often true     Ran Out of Food in the Last Year: Often true   Internet Connectivity: Not on file   Housing/Utilities: Unknown (04/07/2022) Housing/Utilities     Within the past 12 months, have you ever stayed: outside, in a car, in a tent, in an overnight shelter, or temporarily in someone else's home (  i.e. couch-surfing)?: No     Are you worried about losing your housing?: Not on file     Within the past 12 months, have you been unable to get utilities (heat, electricity) when it was really needed?: Not on file   Tobacco Use: Medium Risk (11/18/2022)    Patient History     Smoking Tobacco Use: Former     Smokeless Tobacco Use: Never     Passive Exposure: Not on file   Transportation Needs: No Transportation Needs (01/19/2020)    PRAPARE - Therapist, art (Medical): No     Lack of Transportation (Non-Medical): No   Alcohol Use: Not At Risk (08/11/2019)    Alcohol Use     How often do you have a drink containing alcohol?: Never     How many drinks containing alcohol do you have on a typical day when you are drinking?: Not on file     How often do you have 5 or more drinks on one occasion?: Never   Interpersonal Safety: Not At Risk (03/01/2022)    Interpersonal Safety     Unsafe Where You Currently Live: No     Physically Hurt by Anyone: No     Abused by Anyone: No   Physical Activity: Insufficiently Active (12/20/2017)    Exercise Vital Sign     Days of Exercise per Week: 3 days     Minutes of Exercise per Session: 30 min   Intimate Partner Violence: Not At Risk (06/02/2019)    Humiliation, Afraid, Rape, and Kick questionnaire     Fear of Current or Ex-Partner: No     Emotionally Abused: No     Physically Abused: No     Sexually Abused: No   Stress: Stress Concern Present (06/02/2019)    [employer] of Occupational Health - Occupational Stress Questionnaire     Feeling of Stress : To some extent   Substance Use: Not on file (12/03/2022)   Social Connections: Moderately Isolated (06/02/2019)    Social Connection and Isolation Panel [NHANES]     Frequency of Communication with Friends and Family: More than three times a week Frequency of Social Gatherings with Friends and Family: Never     Attends Religious Services: Never     Database administrator or Organizations: No     Attends Banker Meetings: Never     Marital Status: Married   Programmer, applications: Low Risk  (01/19/2020)    Overall Financial Resource Strain (CARDIA)     Difficulty of Paying Living Expenses: Not hard at all   Depression: Not at risk (07/30/2021)    PHQ-2     PHQ-2 Score: 0   Health Literacy: Low Risk  (08/13/2020)    Health Literacy     : Never       Would you be willing to receive help with any of the needs that you have identified today? Not applicable       SHIPPING     Specialty Medication(s) to be Shipped:   Inflammatory Disorders: Stelara    Other medication(s) to be shipped: No additional medications requested for fill at this time     Changes to insurance: No    Delivery Scheduled: Yes, Expected medication delivery date: 12/5.     Medication will be delivered via Same Day Courier to the confirmed prescription address in [facility].    The patient will receive a drug information handout for  each medication shipped and additional FDA Medication Guides as required.  Verified that patient has previously received a Conservation officer, historic buildings and a Surveyor, mining.    The patient or caregiver noted above participated in the development of this care plan and knows that they can request review of or adjustments to the care plan at any time.      All of the patient's questions and concerns have been addressed.    Teofilo Pod, PharmD   [facility] Specialty Pharmacist

## 2022-12-30 ENCOUNTER — Ambulatory Visit
Admit: 2022-12-30 | Discharge: 2022-12-31 | Payer: MEDICARE | Attending: Foot and Ankle Surgery | Primary: Foot and Ankle Surgery

## 2022-12-30 NOTE — Unmapped (Addendum)
Orthopaedic Foot and Ankle Division  Encounter Provider: Oswald Hillock, MD  Date of Service: 12/30/2022 Last encounter Orthopaedics: 12/17/2022   Last encounter this provider: Visit date not found       MDM (Two highest determine E&M code)   Problems 1 or more chronic illnesses with exacerbation, progression, or side effects of treatment (41660, [id number])   Data Independent interpretation of imaging (99204/99214)   Risk         Primary Care Provider: Corky Mull, MD  Referring Provider: None Per Patient Pcp    ICD-10-CM   1. Plantar fasciitis of right foot  M72.2    Orthopaedic notes: [id number]  Crohn's. Substance/Tob abuse.   H/o C6-7 ACDF  MR-C 01/2018 - Minimal L.C5-6 foram stenosis.    Rodney Bender is a 52 y.o. male   ASSESSMENT   Right plantar fasciitis         PLAN:      We discussed continued conservative treatment for plantar fasciitis including calf stretching and over-the-counter medications including Tylenol and ibuprofen (though he is not able to take NSAIDs due to his Crohn's).  He may continue weightbearing as tolerated in his boot for comfort.  We have ordered an MRI of his right ankle to ensure there is no alternate pathology causing his significant symptoms.  We will follow-up via a telephone visit after his MRI is complete.     Scheduling Notes:  Scheduled With Oswald Hillock, MD: 01/16/2023  With Orthopaedics: 01/16/2023    - Xrays next visit in same ortho team:  None     Requested Prescriptions      No prescriptions requested or ordered in this encounter      Orders Placed This Encounter   Procedures    MRI Lower Extremity Joint Right Wo Contrast       History:  Chief Complaint: Right foot pain  No chief complaint on file.    HPI:  52 y.o. male who presents for evaluation of right foot pain. He endorses a 3 month history of plantar heel and arch pain. He has been evaluated by [name] as well as Melonie Florida who diagnosed him with plantar fasciitis. He had a steroid injection to his plantar fascia on 12/09/2022. He denies any initing trauma. He has tried stretching with no relief. He does get some relief in a CAM boot. He is unable to take NSAIDs due to Crohn's. He has no diabetes or neuropathy.         Medical History Past Medical History:   Diagnosis Date    Acid reflux     Anxiety     Crohn's disease (CMS-HCC)     diagnosed in 1990    GERD (gastroesophageal reflux disease)     Hypertension 10/08/2016      Surgical History Past Surgical History:   Procedure Laterality Date    COLON SURGERY      PR COLONOSCOPY FLX DX W/COLLJ SPEC WHEN PFRMD Left 11/10/2012    Procedure: COLONOSCOPY, FLEXIBLE, PROXIMAL TO SPLENIC FLEXURE; DIAGNOSTIC, W/WO COLLECTION SPECIMEN BY BRUSH OR WASH;  Surgeon: Malcolm Metro, MD;  Location: GI PROCEDURES [facility];  Service: Gastroenterology    PR COLONOSCOPY FLX DX W/COLLJ SPEC WHEN PFRMD N/A 07/21/2013    Procedure: COLONOSCOPY, FLEXIBLE, PROXIMAL TO SPLENIC FLEXURE; DIAGNOSTIC, W/WO COLLECTION SPECIMEN BY BRUSH OR WASH;  Surgeon: Gwen Pounds, MD;  Location: GI PROCEDURES [facility];  Service: Gastroenterology    PR COLONOSCOPY FLX DX W/COLLJ  SPEC WHEN PFRMD N/A 08/09/2016    Procedure: COLONOSCOPY, FLEXIBLE, PROXIMAL TO SPLENIC FLEXURE; DIAGNOSTIC, W/WO COLLECTION SPECIMEN BY BRUSH OR WASH;  Surgeon: Janyth Pupa, MD;  Location: GI PROCEDURES [facility];  Service: Gastroenterology    PR COLONOSCOPY FLX DX W/COLLJ SPEC WHEN PFRMD N/A 04/06/2018    Procedure: COLONOSCOPY, FLEXIBLE, PROXIMAL TO SPLENIC FLEXURE; DIAGNOSTIC, W/WO COLLECTION SPECIMEN BY BRUSH OR WASH;  Surgeon: Zetta Bills, MD;  [facility] [facility];  Service: Gastroenterology    PR COLONOSCOPY FLX DX W/COLLJ SPEC WHEN PFRMD N/A 10/18/2021    Procedure: COLONOSCOPY, FLEXIBLE, PROXIMAL TO SPLENIC FLEXURE; DIAGNOSTIC, W/WO COLLECTION SPECIMEN BY BRUSH OR WASH;  Surgeon: Hunt Oris, MD;  Location: GI PROCEDURES [facility];  Service: Gastroenterology    PR COLONOSCOPY W/BIOPSY SINGLE/MULTIPLE  07/23/2012    Procedure: COLONOSCOPY, FLEXIBLE, PROXIMAL TO SPLENIC FLEXURE; WITH BIOPSY, SINGLE OR MULTIPLE;  Surgeon: Vickii Chafe, MD;  Location: GI PROCEDURES [facility]  Service: Gastroenterology    PR COLONOSCOPY W/BIOPSY SINGLE/MULTIPLE N/A 07/15/2014    Procedure: COLONOSCOPY, FLEXIBLE, PROXIMAL TO SPLENIC FLEXURE; WITH BIOPSY, SINGLE OR MULTIPLE;  Surgeon: Janyth Pupa, MD;  Location: GI PROCEDURES [facility];  Service: Gastroenterology    PR COLONOSCOPY W/BIOPSY SINGLE/MULTIPLE N/A 03/21/2017    Procedure: COLONOSCOPY, FLEXIBLE, PROXIMAL TO SPLENIC FLEXURE; WITH BIOPSY, SINGLE OR MULTIPLE;  Surgeon: Modena Nunnery, MD;  [facility] [place] [facility];  Service: Gastroenterology    PR COLONOSCOPY W/BIOPSY SINGLE/MULTIPLE N/A 02/12/2019    Procedure: COLONOSCOPY, FLEXIBLE, PROXIMAL TO SPLENIC FLEXURE; WITH BIOPSY, SINGLE OR MULTIPLE;  Surgeon: Jules Husbands, MD;  Location: GI PROCEDURES [facility];  Service: Gastroenterology    PR COLONOSCOPY W/BIOPSY SINGLE/MULTIPLE Left 05/15/2020    Procedure: COLONOSCOPY, FLEXIBLE, PROXIMAL TO SPLENIC FLEXURE; WITH BIOPSY, SINGLE OR MULTIPLE;  Surgeon: Rona Ravens, MD;  Location: [facility] [place] [facility];  Service: Gastroenterology    PR COLSC FLEXIBLE W/TRANSENDOSCOPIC BALLOON DILAT N/A 07/15/2014    Procedure: COLONOSCOPY, FLEXIBLE; WITH DILATION BY BALLOON, 1 OR MORE STRICTURES;  Surgeon: Janyth Pupa, MD;  Location: GI PROCEDURES [facility];  Service: Gastroenterology    PR ESOPHGL BALO DISTENSION DX STD W/PROVOCATION N/A 11/18/2022    Procedure: ESOPHAGEAL BALLOON DISTENSION STUDY, DIAGNOSTIC WITH PROVOCATION;  Surgeon: Hunt Oris, MD;  Location: GI PROCEDURES [facility];  Service: Gastroenterology    PR INJECTION,ONABOTULINUMTOXINA N/A 11/18/2022    Procedure: INJECTION ONABOTULINUMTOXINA 1 UNIT;  Surgeon: Hunt Oris, MD; Location: [facility]URES [facility];  Service: Gastroenterology    PR REMVL COLON & TERM ILEUM W/ILEOCOLOSTOMY N/A 09/16/2016    Procedure: COLECTOMY, PARTIAL, WITH REMOVAL OF TERMINAL ILEUM WITH ILEOCOLOSTOMY;  Surgeon: Lady Gary, MD;  Location: MAIN OR [facility];  Service: Gastrointestinal    PR REPAIR BICEPS LONG TENDON Right 03/19/2019    Procedure: TENODESIS LONG TENDON BICEPS;  Surgeon: Gonzella Lex, MD;  Location: ASC OR [facility];  Service: Orthopedics    PR SHLDR ARTHROSCOP,PART ACROMIOPLAS Right 10/02/2018    Procedure: R16 ARTHROSCOPY, SHOULDER, SURGICAL; DECOMPRESS SUBACROMIAL SPACE W/PART ACROMIOPLASTY, Tamala Bari;  Surgeon: Gonzella Lex, MD;  Location: ASC OR [facility];  Service: Orthopedics    PR SHLDR ARTHROSCOP,SURG,W/ROTAT CUFF REPR Right 10/02/2018    Procedure: ARTHROSCOPY, SHOULDER, SURGICAL; WITH ROTATOR CUFF REPAIR;  Surgeon: Gonzella Lex, MD;  Location: ASC OR [facility];  Service: Orthopedics    PR SHLDR ARTHROSCOP,SURG,W/ROTAT CUFF REPR Right 03/19/2019    Procedure: R16 ARTHROSCOPY, SHOULDER, SURGICAL; WITH ROTATOR CUFF REPAIR;  Surgeon: Tinnie Gens  Cyndia Skeeters, MD;  Location: ASC OR [facility];  Service: Orthopedics    PR UP GI ENDOSCOPY,BALL DIL,30MM N/A 11/18/2022    Procedure: UGI ENDO; W/BALLOON DILAT ESOPHAGUS (<30MM DIAM);  Surgeon: Hunt Oris, MD;  Location: GI PROCEDURES [facility];  Service: Gastroenterology    PR UPPER GI ENDOSCOPY,BIOPSY N/A 07/23/2012    Procedure: UGI ENDOSCOPY; WITH BIOPSY, SINGLE OR MULTIPLE;  Surgeon: Vickii Chafe, MD;  Location: GI PROCEDURES [facility];  Service: Gastroenterology    PR UPPER GI ENDOSCOPY,DIAGNOSIS N/A 11/10/2012    Procedure: UGI ENDO, INCLUDE ESOPHAGUS, STOMACH, & DUODENUM &/OR JEJUNUM; DX W/WO COLLECTION SPECIMN, BY BRUSH OR WASH;  Surgeon: Malcolm Metro, MD;  Location: GI PROCEDURES [facility];  Service: Gastroenterology    PR UPPER GI ENDOSCOPY,DIAGNOSIS N/A 07/21/2013    Procedure: UGI ENDO, INCLUDE ESOPHAGUS, STOMACH, & DUODENUM &/OR JEJUNUM; DX W/WO COLLECTION SPECIMN, BY BRUSH OR WASH;  Surgeon: Gwen Pounds, MD;  Location: GI PROCEDURES [name];  Service: Gastroenterology    PR UPPER GI ENDOSCOPY,DIAGNOSIS N/A 07/15/2014    Procedure: UGI ENDO, INCLUDE ESOPHAGUS, STOMACH, & DUODENUM &/OR JEJUNUM; DX W/WO COLLECTION SPECIMN, BY BRUSH OR WASH;  Surgeon: Janyth Pupa, MD;  Location: GI PROCEDURES [facility];  Service: Gastroenterology    PR UPPER GI ENDOSCOPY,DIAGNOSIS N/A 10/18/2021    Procedure: UGI ENDO, INCLUDE ESOPHAGUS, STOMACH, & DUODENUM &/OR JEJUNUM; DX W/WO COLLECTION SPECIMN, BY BRUSH OR WASH;  Surgeon: Hunt Oris, MD;  Location: GI PROCEDURES [facility];  Service: Gastroenterology    ROTATOR CUFF REPAIR Right 2020    SHOULDER SURGERY      SPINE SURGERY        Allergies Tramadol, Penicillins, Dilaudid [hydromorphone], and Morphine   Medications He has a current medication list which includes the following prescription(s): buspirone, carisoprodol, citalopram, cyclobenzaprine, dicyclomine, dronabinol, erythromycin, ferrous sulfate, gabapentin, hydrocodone-acetaminophen, hydroxyzine, loperamide, meloxicam, naloxone, olanzapine, ondansetron, oxycodone-acetaminophen, pantoprazole, promethazine, sertraline, testosterone, therapeutic multivitamin, and ustekinumab.   Family History His family history includes Cancer in his father and maternal aunt; Hyperlipidemia in his father; No Known Problems in his brother, maternal grandfather, maternal grandmother, maternal uncle, paternal aunt, paternal grandfather, paternal grandmother, paternal uncle, and sister; Stroke in his mother.   Social History He reports that he quit smoking about 5 years ago. His smoking use included cigarettes. He started smoking about 19 years ago. He has a 18 pack-year smoking history. He has never used smokeless tobacco. He reports that he does not currently use alcohol. He reports that he does not currently use drugs after having used the following drugs: Marijuana.Home [address]  [place] [address]  Occupation:         Occupational History     Comment: not working     Social History     Social History Narrative    Information taken:  03/31/19        Born [place], [place]        Raised with grandmother        2 siblings- pt is the youngest    Brother lives in [place], [place]    Sister lives in [place], [place]        Married for 62yrs to [employer]        No children         Education - completed through 11th grade I got kicked out at 12th grade        Not working, on disability benefits- Crohn's disease  Exam:  The encounter diagnosis was Plantar fasciitis of right foot.   Estimated body mass index is 21.62 kg/m?? as calculated from the following:    Height as of 11/18/22: 180.3 cm (5' 11).    Weight as of 11/18/22: 70.3 kg (155 lb).     Musculoskeletal   Right Foot    Tenderness: Exquisitely tender to palpation about the plantar aspect of the calcaneus as well as the plantar foot along the arch  Skin is benign with no lesions or breakdown  SILT in Su/Sa/SP/DP/T distributions  10 degrees beyond neutral DF     Test Results  The encounter diagnosis was Plantar fasciitis of right foot.  No results found for: A1C    No results found for: VITD    Imaging  Orders Placed This Encounter   Procedures    MRI Lower Extremity Joint Right Wo Contrast     Previous imaging was personally reviewed and interpreted by the encounter provider today..  Previous WB foot XR shows normal alignment, no arthritis, no enthesophyte.    DME ORDER:  Dx:  ,

## 2022-12-31 NOTE — Unmapped (Signed)
Attending Attestation:  I saw and evaluated the patient, participating in the key portions of the service.     I determined the assessment and plan of care for the patient.  I reviewed and agree with the documented findings and plan in the resident's note above.  --Beverely Pace Deborah Chalk, MD  December 31, 2022 8:07 AM

## 2022-12-31 NOTE — Unmapped (Signed)
Addended by: Daphane Shepherd on: 12/31/2022 08:07 AM     Modules accepted: Level of Service

## 2023-01-02 MED FILL — STELARA 90 MG/ML SUBCUTANEOUS SYRINGE: SUBCUTANEOUS | 56 days supply | Qty: 1 | Fill #5

## 2023-01-06 MED ORDER — DRONABINOL 10 MG CAPSULE
ORAL_CAPSULE | Freq: Two times a day (BID) | ORAL | 0 refills | 30.00 days | Status: CP
Start: 2023-01-06 — End: ?

## 2023-01-09 MED ORDER — DRONABINOL 10 MG CAPSULE
ORAL_CAPSULE | Freq: Two times a day (BID) | ORAL | 0 refills | 30.00 days
Start: 2023-01-09 — End: ?

## 2023-01-10 MED ORDER — DRONABINOL 10 MG CAPSULE
ORAL_CAPSULE | Freq: Two times a day (BID) | ORAL | 0 refills | 30.00 days
Start: 2023-01-10 — End: ?

## 2023-01-10 NOTE — Unmapped (Signed)
Marinol refilled on 12/9 by Dr. Elizebeth Brooking.  Declining this duplicate request.

## 2023-01-20 NOTE — Unmapped (Signed)
Called pt to follow-up on his MyChart message reporting a lot of pain and can't keep nothing down.    Called pt. Left message asking him to call me about his symptoms or respond to a MyChart message.

## 2023-01-20 NOTE — Unmapped (Signed)
Pt called back. Pt states he's been feeling poorly x 1 week.    Pt states for the last week, he's had generalized abdominal pain, vomiting and blood in his stool. His pain is 10/10.  He's vomiting green liquid or food, vomiting 2-3 times per day and more if he eats.  He's taking promethazine every 6 hours, which is not helping.  Therefore, he's not eaten today and tells me he ate mashed potatoes and green beans yesterday, but vomited afterward.  He tells me he is not drinking a lot. His mouth is dry, but his urine is clear to yellow, not darker than that.  Pt is seeing blood in about half of his stools, having BM about 5 times per day.  He denies dizziness, shortness of breath, chest pain and fever.  He does endorse fatigue and is resting.  Pt tells me he is out of his oxycodone 10mg  until Friday.  I asked pt if this feels like a flare of Crohn's or something different.  Pt states he's not sure--feels like it might be a flare.  He endorses continuation of all his GI meds below:  - Continue stelara every 8 weeks  - Increase pantoprazole 40 mg to twice daily 30 minutes before meal  - Continue gabapentin to 600 mg 3 times daily  - Continue Marinol  - Continue ondansetron solutabs (Zofran-ODT) 4-8 mg (1-2 disintegrating tablets) every 8 hours as needed.    Given pt's severe pain and blood in stool, I suggested he seek ED care. Pt states his wife has advised the same. He's not sure he'll go.  I advised him I'll let Dr. Elizebeth Brooking know of his symptoms, but ED might be needed to get him hydrated and rule out and life-threatening causes of abdominal pain. Pt states understanding.

## 2023-01-21 ENCOUNTER — Ambulatory Visit
Admit: 2023-01-21 | Discharge: 2023-01-21 | Disposition: A | Discharge status: Home / Self Care | Payer: MEDICARE | Attending: Student in an Organized Health Care Education/Training Program

## 2023-01-21 ENCOUNTER — Emergency Department
Admit: 2023-01-21 | Discharge: 2023-01-21 | Disposition: A | Discharge status: Home / Self Care | Payer: MEDICARE | Attending: Student in an Organized Health Care Education/Training Program

## 2023-01-21 DIAGNOSIS — R109 Unspecified abdominal pain: Principal | ICD-10-CM

## 2023-01-21 LAB — CBC W/ AUTO DIFF
BASOPHILS ABSOLUTE COUNT: 0 10*9/L (ref 0.0–0.1)
BASOPHILS RELATIVE PERCENT: 0.5 %
EOSINOPHILS ABSOLUTE COUNT: 0.1 10*9/L (ref 0.0–0.5)
EOSINOPHILS RELATIVE PERCENT: 0.5 %
HEMATOCRIT: 36 % — ABNORMAL LOW (ref 39.0–48.0)
HEMOGLOBIN: 12.4 g/dL — ABNORMAL LOW (ref 12.9–16.5)
LYMPHOCYTES ABSOLUTE COUNT: 2.1 10*9/L (ref 1.1–3.6)
LYMPHOCYTES RELATIVE PERCENT: 21.5 %
MEAN CORPUSCULAR HEMOGLOBIN CONC: 34.5 g/dL (ref 32.0–36.0)
MEAN CORPUSCULAR HEMOGLOBIN: 31.9 pg (ref 25.9–32.4)
MEAN CORPUSCULAR VOLUME: 92.5 fL (ref 77.6–95.7)
MONOCYTES ABSOLUTE COUNT: 0.8 10*9/L (ref 0.3–0.8)
MONOCYTES RELATIVE PERCENT: 8.5 %
NEUTROPHILS ABSOLUTE COUNT: 6.7 10*9/L (ref 1.8–7.8)
NEUTROPHILS RELATIVE PERCENT: 69 %
PLATELET COUNT: >281 10*9/L (ref 150–450)
RED BLOOD CELL COUNT: 3.89 10*12/L — ABNORMAL LOW (ref 4.26–5.60)
RED CELL DISTRIBUTION WIDTH: 14.9 % (ref 12.2–15.2)
WBC ADJUSTED: 9.7 10*9/L (ref 3.6–11.2)

## 2023-01-21 LAB — COMPREHENSIVE METABOLIC PANEL
ALBUMIN: 4.3 g/dL (ref 3.4–5.0)
ALKALINE PHOSPHATASE: 120 U/L — ABNORMAL HIGH (ref 46–116)
ALT (SGPT): 20 U/L (ref 10–49)
ANION GAP: 9 mmol/L (ref 5–14)
AST (SGOT): 23 U/L (ref <=34)
BILIRUBIN TOTAL: 0.3 mg/dL (ref 0.3–1.2)
BLOOD UREA NITROGEN: 9 mg/dL (ref 9–23)
BUN / CREAT RATIO: 10
CALCIUM: 10.3 mg/dL (ref 8.7–10.4)
CHLORIDE: 100 mmol/L (ref 98–107)
CO2: 27 mmol/L (ref 20.0–31.0)
CREATININE: 0.92 mg/dL (ref 0.73–1.18)
EGFR CKD-EPI (2021) MALE: >90 mL/min/{1.73_m2} (ref >=60)
GLUCOSE RANDOM: 97 mg/dL (ref 70–179)
POTASSIUM: 4 mmol/L (ref 3.4–4.8)
PROTEIN TOTAL: 7.8 g/dL (ref 5.7–8.2)
SODIUM: 136 mmol/L (ref 135–145)

## 2023-01-21 LAB — LIPASE: LIPASE: 49 U/L (ref 12–53)

## 2023-01-21 LAB — SLIDE REVIEW

## 2023-01-21 LAB — C-REACTIVE PROTEIN: C-REACTIVE PROTEIN: 11 mg/L — ABNORMAL HIGH (ref <=10.0)

## 2023-01-21 LAB — SEDIMENTATION RATE: ERYTHROCYTE SEDIMENTATION RATE: 28 mm/h — ABNORMAL HIGH (ref 0–20)

## 2023-01-21 MED ORDER — OXYCODONE-ACETAMINOPHEN 10 MG-325 MG TABLET
ORAL_TABLET | ORAL | 0 refills | 2 days | Status: CP | PRN
Start: 2023-01-21 — End: 2023-01-26
  Filled 2023-01-21: qty 10, 2d supply, fill #0

## 2023-01-21 MED ADMIN — morphine 4 mg/mL injection 4 mg: 4 mg | INTRAVENOUS | @ 20:00:00 | Stop: 2023-01-21

## 2023-01-21 MED ADMIN — ondansetron (ZOFRAN) injection 4 mg: 4 mg | INTRAVENOUS | @ 22:00:00 | Stop: 2023-01-21

## 2023-01-21 MED ADMIN — morphine 4 mg/mL injection 4 mg: 4 mg | INTRAVENOUS | @ 22:00:00 | Stop: 2023-01-21

## 2023-01-21 MED ADMIN — ondansetron (ZOFRAN) injection 4 mg: 4 mg | INTRAVENOUS | @ 20:00:00 | Stop: 2023-01-21

## 2023-01-21 MED ADMIN — iohexol (OMNIPAQUE) 350 mg iodine/mL solution 100 mL: 100 mL | INTRAVENOUS | @ 22:00:00 | Stop: 2023-01-21

## 2023-01-21 NOTE — Unmapped (Signed)
Here w/ Crohn's flare.

## 2023-01-21 NOTE — Unmapped (Signed)
[facility]  Emergency Department Provider Note      ED Clinical Impression      Final diagnoses:   Abdominal pain, unspecified abdominal location (Primary)   Crohn's disease of both small and large intestine with other complication (CMS-HCC)            Impression, Medical Decision Making, Progress Notes and Critical Care      Impression, Differential Diagnosis and Plan of Care    52 year old male with a history of Crohn's disease with multiple surgical procedures presents to the emergency department with complaints of abdominal pain, nausea/vomiting/bloody bowel movements similar to prior Crohn's flares.    Differential is broad, including gastroenteritis, viral syndrome, Crohn's flare, intra-abdominal abscess, bowel obstruction, amongst many other etiologies.    We will check labs, urinalysis, CT scan of the abdomen pelvis, discussed with GI, provide symptomatic treatment, and reassess.        Independent Interpretation of Studies    I have independently interpreted the following studies:  EKG:   X-ray(s):   CT/MRI(s): CT Abd pending at the time of transfer of care.  Ultrasound(s):     Discussion of Management with other Providers or Support Staff    I discussed the management of this patient with the:  Admitting provider:   Consultant(s):   Radiologist:   ED Pharmacist:   Case Management/Social Work:   Other:     Considerations Regarding Disposition/Escalation of Care and Critical Care    Indications for observation/admission (or consideration of observation/admission) and/or appropriateness for outpatient management:   Patient/Family/Caregiver Discussions:   Diagnostic Tests Considered But Not Done:   Prescription Drugs Provided or Considered But Not Given:   Social Determinants of Health which significantly affected care:       Additional Progress Notes    Care transferred to Dr. Denzil Magnuson at 17:00 with CT scan and disposition pending.      Portions of this record have been created using Scientist, clinical (histocompatibility and immunogenetics). Dictation errors have been sought, but may not have been identified and corrected.    See chart and nursing documentation for additional details.    ____________________________________________         History        Reason for Visit  Abdominal Pain      HPI   Rodney Bender is a 52 y.o. male with a history of Crohn's disease status post multiple surgeries, including ileocecectomy and revision with reanastomosis presents to the emergency department with complaints of abdominal pain, nausea/vomiting/bloody stools similar to prior Crohn's flares.  He denies fevers or chills.  He states that he has had crampy abdominal pain diffusely similar to prior flares.  Last flare was approximately 1 year ago per his report.  He has been communicating with his GI clinic at [facility] who recommended presenting to the emergency department for evaluation.    Outside Historian(s)  (EMS, Significant Other, Family, Parent, Caregiver, Friend, Patent examiner, etc.)        External Records Reviewed  (Inpatient/Outpatient notes, Prior labs/imaging studies, Care Everywhere, PDMP, External ED notes, etc)    Outpatient clinic notes reviewed.    Past Medical History:   Diagnosis Date    Acid reflux     Anxiety     Crohn's disease (CMS-HCC)     diagnosed in 1990    GERD (gastroesophageal reflux disease)     Hypertension 10/08/2016       Patient Active Problem List   Diagnosis    Tobacco  use disorder    Chronic nausea    Intestinal bypass or anastomosis status    Mixed, or nondependent drug abuse, in remission (CMS-HCC)    Crohn's disease of both small and large intestine with other complication (CMS-HCC)    Chronic abdominal pain    Rotator cuff syndrome of right shoulder    Traumatic partial tear of right biceps tendon    Hyponatremia    Hyperkalemia    Anxiety       Past Surgical History:   Procedure Laterality Date    COLON SURGERY      PR COLONOSCOPY FLX DX W/COLLJ SPEC WHEN PFRMD Left 11/10/2012    Procedure: COLONOSCOPY, FLEXIBLE, PROXIMAL TO SPLENIC FLEXURE; DIAGNOSTIC, W/WO COLLECTION SPECIMEN BY BRUSH OR WASH;  Surgeon: Malcolm Metro, MD;  [facility];  Service: Gastroenterology    PR COLONOSCOPY FLX DX W/COLLJ SPEC WHEN PFRMD N/A 07/21/2013    Procedure: COLONOSCOPY, FLEXIBLE, PROXIMAL TO SPLENIC FLEXURE; DIAGNOSTIC, W/WO COLLECTION SPECIMEN BY BRUSH OR WASH;  Surgeon: Gwen Pounds, MD;  Location: [facility];  Service: Gastroenterology    PR COLONOSCOPY FLX DX W/COLLJ SPEC WHEN PFRMD N/A 08/09/2016    Procedure: COLONOSCOPY, FLEXIBLE, PROXIMAL TO SPLENIC FLEXURE; DIAGNOSTIC, W/WO COLLECTION SPECIMEN BY BRUSH OR WASH;  Surgeon: Janyth Pupa, MD;  Location: [facility];  Service: Gastroenterology    PR COLONOSCOPY FLX DX W/COLLJ SPEC WHEN PFRMD N/A 04/06/2018    Procedure: COLONOSCOPY, FLEXIBLE, PROXIMAL TO SPLENIC FLEXURE; DIAGNOSTIC, W/WO COLLECTION SPECIMEN BY BRUSH OR WASH;  Surgeon: Zetta Bills, MD;  [facility] [place] [facility];  Service: Gastroenterology    PR COLONOSCOPY FLX DX W/COLLJ SPEC WHEN PFRMD N/A 10/18/2021    Procedure: COLONOSCOPY, FLEXIBLE, PROXIMAL TO SPLENIC FLEXURE; DIAGNOSTIC, W/WO COLLECTION SPECIMEN BY BRUSH OR WASH;  Surgeon: Hunt Oris, MD;  Location: [facility];  Service: Gastroenterology    PR COLONOSCOPY W/BIOPSY SINGLE/MULTIPLE  07/23/2012    Procedure: COLONOSCOPY, FLEXIBLE, PROXIMAL TO SPLENIC FLEXURE; WITH BIOPSY, SINGLE OR MULTIPLE;  Surgeon: Vickii Chafe, MD;  Location: [facility];  Service: Gastroenterology    PR COLONOSCOPY W/BIOPSY SINGLE/MULTIPLE N/A 07/15/2014    Procedure: COLONOSCOPY, FLEXIBLE, PROXIMAL TO SPLENIC FLEXURE; WITH BIOPSY, SINGLE OR MULTIPLE;  Surgeon: Janyth Pupa, MD;  Location: [facility];  Service: Gastroenterology    PR COLONOSCOPY W/BIOPSY SINGLE/MULTIPLE N/A 03/21/2017    Procedure: COLONOSCOPY, FLEXIBLE, PROXIMAL TO SPLENIC FLEXURE; WITH BIOPSY, SINGLE OR MULTIPLE;  Surgeon: Modena Nunnery, MD;  Location[facility] [place] [facility];  Service: Gastroenterology    PR COLONOSCOPY W/BIOPSY SINGLE/MULTIPLE N/A 02/12/2019    Procedure: COLONOSCOPY, FLEXIBLE, PROXIMAL TO SPLENIC FLEXURE; WITH BIOPSY, SINGLE OR MULTIPLE;  Surgeon: Jules Husbands, MD;  Location: [facility];  Service: Gastroenterology    PR COLONOSCOPY W/BIOPSY SINGLE/MULTIPLE Left 05/15/2020    Procedure: COLONOSCOPY, FLEXIBLE, PROXIMAL TO SPLENIC FLEXURE; WITH BIOPSY, SINGLE OR MULTIPLE;  Surgeon: Rona Ravens, MD;  [facility] [place] [facility];  Service: Gastroenterology    PR COLSC FLEXIBLE W/TRANSENDOSCOPIC BALLOON DILAT N/A 07/15/2014    Procedure: COLONOSCOPY, FLEXIBLE; WITH DILATION BY BALLOON, 1 OR MORE STRICTURES;  Surgeon: Janyth Pupa, MD;  Location: [facility];  Service: Gastroenterology    PR ESOPHGL BALO DISTENSION DX STD W/PROVOCATION N/A 11/18/2022    Procedure: ESOPHAGEAL BALLOON DISTENSION STUDY, DIAGNOSTIC WITH PROVOCATION;  Surgeon: Hunt Oris, MD;  Location: [facility];  Service:  Gastroenterology    PR INJECTION,ONABOTULINUMTOXINA N/A 11/18/2022    Procedure: INJECTION ONABOTULINUMTOXINA 1 UNIT;  Surgeon: Hunt Oris, MD;  Location: [facility];  Service: Gastroenterology    PR REMVL COLON & TERM ILEUM W/ILEOCOLOSTOMY N/A 09/16/2016    Procedure: COLECTOMY, PARTIAL, WITH REMOVAL OF TERMINAL ILEUM WITH ILEOCOLOSTOMY;  Surgeon: Lady Gary, MD;  Location: MAIN OR [facility];  Service: Gastrointestinal    PR REPAIR BICEPS LONG TENDON Right 03/19/2019    Procedure: TENODESIS LONG TENDON BICEPS;  Surgeon: Gonzella Lex, MD;  Location: ASC OR [facility];  Service: Orthopedics    PR SHLDR ARTHROSCOP,PART ACROMIOPLAS Right 10/02/2018    Procedure: R16 ARTHROSCOPY, SHOULDER, SURGICAL; DECOMPRESS SUBACROMIAL SPACE W/PART ACROMIOPLASTY, Tamala Bari;  Surgeon: Gonzella Lex, MD;  Location: ASC OR [facility];  Service: Orthopedics    PR SHLDR ARTHROSCOP,SURG,W/ROTAT CUFF REPR Right 10/02/2018    Procedure: ARTHROSCOPY, SHOULDER, SURGICAL; WITH ROTATOR CUFF REPAIR;  Surgeon: Gonzella Lex, MD;  Location: ASC OR [facility];  Service: Orthopedics    PR SHLDR ARTHROSCOP,SURG,W/ROTAT CUFF REPR Right 03/19/2019    Procedure: R16 ARTHROSCOPY, SHOULDER, SURGICAL; WITH ROTATOR CUFF REPAIR;  Surgeon: Gonzella Lex, MD;  Location: ASC OR [facility];  Service: Orthopedics    PR UP GI ENDOSCOPY,BALL DIL,30MM N/A 11/18/2022    Procedure: UGI ENDO; W/BALLOON DILAT ESOPHAGUS (<30MM DIAM);  Surgeon: Hunt Oris, MD;  Location: [facility];  Service: Gastroenterology    PR UPPER GI ENDOSCOPY,BIOPSY N/A 07/23/2012    Procedure: UGI ENDOSCOPY; WITH BIOPSY, SINGLE OR MULTIPLE;  Surgeon: Vickii Chafe, MD;  Location: [facility];  Service: Gastroenterology    PR UPPER GI ENDOSCOPY,DIAGNOSIS N/A 11/10/2012    Procedure: UGI ENDO, INCLUDE ESOPHAGUS, STOMACH, & DUODENUM &/OR JEJUNUM; DX W/WO COLLECTION SPECIMN, BY BRUSH OR WASH;  Surgeon: Malcolm Metro, MD;  Location: [facility];  Service: Gastroenterology    PR UPPER GI ENDOSCOPY,DIAGNOSIS N/A 07/21/2013    Procedure: UGI ENDO, INCLUDE ESOPHAGUS, STOMACH, & DUODENUM &/OR JEJUNUM; DX W/WO COLLECTION SPECIMN, BY BRUSH OR WASH;  Surgeon: Gwen Pounds, MD;  Location: [facility];  Service: Gastroenterology    PR UPPER GI ENDOSCOPY,DIAGNOSIS N/A 07/15/2014    Procedure: UGI ENDO, INCLUDE ESOPHAGUS, STOMACH, & DUODENUM &/OR JEJUNUM; DX W/WO COLLECTION SPECIMN, BY BRUSH OR WASH;  Surgeon: Janyth Pupa, MD;  Location: [facility];  Service: Gastroenterology    PR UPPER GI ENDOSCOPY,DIAGNOSIS N/A 10/18/2021    Procedure: UGI ENDO, INCLUDE ESOPHAGUS, STOMACH, & DUODENUM &/OR JEJUNUM; DX W/WO COLLECTION SPECIMN, BY BRUSH OR WASH;  Surgeon: Hunt Oris, MD;  Location: [facility]URES [facility];  Service: Gastroenterology    ROTATOR CUFF REPAIR Right 2020    SHOULDER SURGERY      SPINE SURGERY         No current facility-administered medications for this encounter.    Current Outpatient Medications:     busPIRone (BUSPAR) 10 MG tablet, Take 2 tablets (20 mg total) by mouth Three (3) times a day., Disp: 180 tablet, Rfl: 2    carisoprodol (SOMA) 350 MG tablet, , Disp: , Rfl:     citalopram (CELEXA) 40 MG tablet, Take 1 tablet (40 mg total) by mouth daily., Disp: 30 tablet, Rfl: 11    cyclobenzaprine (FLEXERIL) 5 MG tablet, , Disp: , Rfl:     dicyclomine (BENTYL) 20 mg tablet, , Disp: , Rfl:     dronabinol (MARINOL) 10 MG capsule, Take  1 capsule (10 mg total) by mouth Two (2) times a day (30 minutes before a meal)., Disp: 60 capsule, Rfl: 0    erythromycin (ROMYCIN) 5 mg/gram (0.5 %) ophthalmic ointment, Apply 1 application. (1 Application total) to eye., Disp: , Rfl:     ferrous sulfate 325 (65 FE) MG tablet, TAKE 1 TABLET BY MOUTH EVERY OTHER DAY, Disp: 60 tablet, Rfl: 0    gabapentin (NEURONTIN) 300 MG capsule, TAKE 2 CAPSULES BY MOUTH THREE TIMES DAILY, Disp: 180 capsule, Rfl: 5    HYDROcodone-acetaminophen (NORCO) 5-325 mg per tablet, take 1 to 2 tablets by mouth every 6 hours if needed, Disp: , Rfl:     hydrOXYzine (ATARAX) 25 MG tablet, Take 1 tablet (25 mg total) by mouth Three (3) times a day as needed for anxiety., Disp: 90 tablet, Rfl: 3    loperamide (IMODIUM A-D) 2 mg tablet, Take 1 tablet (2 mg total) by mouth daily as needed., Disp: , Rfl:     meloxicam (MOBIC) 7.5 MG tablet, Take 1 tablet twice a day by oral route with meals., Disp: , Rfl:     naloxone (NARCAN) 4 mg nasal spray, , Disp: , Rfl:     OLANZapine (ZYPREXA) 2.5 MG tablet, Take 1 tablet by mouth nightly, Disp: 30 tablet, Rfl: 0    ondansetron (ZOFRAN-ODT) 4 MG disintegrating tablet, Dissolve 2 tablets (8 mg total) in the mouth every eight (8) hours as needed for nausea., Disp: 45 tablet, Rfl: 11    [Paused] oxyCODONE-acetaminophen (PERCOCET) 10-325 mg per tablet, Take 1 tablet by mouth every eight (8) hours as needed., Disp: , Rfl:     oxyCODONE-acetaminophen (PERCOCET) 10-325 mg per tablet, Take 1 tablet by mouth every four (4) hours as needed for pain., Disp: 10 tablet, Rfl: 0    pantoprazole (PROTONIX) 40 MG tablet, Take 1 tablet (40 mg total) by mouth Two (2) times a day (30 minutes before a meal)., Disp: 180 tablet, Rfl: 3    promethazine (PHENERGAN) 25 MG tablet, Take 2 tablets (50 mg total) by mouth every six (6) hours as needed for nausea., Disp: 45 tablet, Rfl: 11    sertraline (ZOLOFT) 50 MG tablet, , Disp: , Rfl:     testosterone 20.25 mg/1.25 gram (1.62 %) gel pump, Place 2 sprays (40.5 mg total) on the skin daily., Disp: 75 g, Rfl: 5    therapeutic multivitamin (THERAGRAN) tablet, Take 1 tablet by mouth daily., Disp: 90 tablet, Rfl: 3    ustekinumab (STELARA) 90 mg/mL Syrg syringe, Inject the contents of 1 syringe (90 mg total) under the skin every 8 weeks., Disp: 1 mL, Rfl: 5    Allergies  Tramadol, Penicillins, Dilaudid [hydromorphone], and Morphine    Family History   Problem Relation Age of Onset    Hyperlipidemia Father     Cancer Father         kidney    Cancer Maternal Aunt     Stroke Mother     No Known Problems Sister     No Known Problems Brother     No Known Problems Maternal Uncle     No Known Problems Paternal Aunt     No Known Problems Paternal Uncle     No Known Problems Maternal Grandmother     No Known Problems Maternal Grandfather     No Known Problems Paternal Grandmother     No Known Problems Paternal Grandfather     Anesthesia problems Neg Hx     Broken  bones Neg Hx     Clotting disorder Neg Hx     Collagen disease Neg Hx     Diabetes Neg Hx     Dislocations Neg Hx     Fibromyalgia Neg Hx     Gout Neg Hx     Hemophilia Neg Hx     Osteoporosis Neg Hx     Rheumatologic disease Neg Hx     Scoliosis Neg Hx     Severe sprains Neg Hx     Sickle cell anemia Neg Hx Spinal Compression Fracture Neg Hx        Social History  Social History     Tobacco Use    Smoking status: Former     Current packs/day: 0.00     Average packs/day: 1 pack/day for 18.0 years (18.0 ttl pk-yrs)     Types: Cigarettes     Start date: 08/27/2003     Quit date: 06/06/2017     Years since quitting: 5.6    Smokeless tobacco: Never    Tobacco comments:     Pt smokes 1ppd, Pt is interested in tobacco cessation    Vaping Use    Vaping status: Never Used   Substance Use Topics    Alcohol use: Not Currently    Drug use: Not Currently     Types: Marijuana          Physical Exam     This provider entered the patient's room: Yes:    If this provider did not enter the room, a comprehensive physical exam was not able to be performed due to increased infection risk to themselves, other providers, staff and other patients), as well as to conserve personal protective equipment (PPE) utilization during the COVID-19 pandemic.    If this provider did enter the patient room, the following was PPE worn: Surgical mask, eye protection and gloves     ED Triage Vitals   Enc Vitals Group      BP 01/21/23 1309 165/99      Heart Rate 01/21/23 1305 81      SpO2 Pulse --       Resp 01/21/23 1307 18      Temp 01/21/23 1307 36.7 ??C (98.1 ??F)      Temp Source 01/21/23 1307 Oral      SpO2 01/21/23 1305 100 %      Weight 01/21/23 1307 79 kg (174 lb 2.6 oz)       Constitutional: Alert and oriented. Well appearing and in mild distress due to pain.  Eyes: Conjunctivae are normal.  ENT       Head: Normocephalic and atraumatic.       Nose: No congestion.       Mouth/Throat: Mucous membranes are moist.       Neck: No stridor.  Hematological/Lymphatic/Immunilogical: No cervical lymphadenopathy.  Cardiovascular: Tachycardic rate, regular rhythm. Normal and symmetric distal pulses are present in all extremities.  Respiratory: Normal respiratory effort. Breath sounds are normal.  Gastrointestinal: Abdomen soft with moderate diffuse tenderness with voluntary guarding without rebound noted.  Well-healed surgical scars noted.  Musculoskeletal: Normal range of motion in all extremities.       Right lower leg: No tenderness or edema.       Left lower leg: No tenderness or edema.  Neurologic: Normal speech and language. No gross focal neurologic deficits are appreciated.  Skin: Skin is warm, dry and intact. No rash noted.  Psychiatric: Mood and affect are normal. Speech  and behavior are normal.       Radiology          Procedures             Precious Gilding, MD  January 21, 2023 3:44 PM             Precious Gilding, MD  01/23/23 2106

## 2023-01-22 NOTE — Unmapped (Signed)
Received sign out from previous provider.    Patient Summary: Rodney Bender is a 52 y.o. male with past medical history of Crohn's disease with multiple surgical procedures presents to the emergency department with concerns for multiple days of abdominal pain, nausea/vomiting/bloody bowel movements similar to prior Crohn's flares.  Laboratory workup thus far notable for mildly elevated CRP and ESR, otherwise unremarkable.    Action List:   Pending CT abdomen pelvis and clinical reevaluation.    Updates  ED Course as of 01/21/23 1840   Tue Jan 21, 2023   1820 CT Abdomen Pelvis W IV Contrast  CT abdomen pelvis independently interpreted, no acute intra-abdominal pathology.  Reevaluated patient, he reports feeling improved after administration of IV analgesia.  He has a benign abdominal exam with no focal tenderness to palpation.  In discussions with patient, he reports that he has run out of home Percocet, do wonder if this is driving part of his symptoms.  Will provide him with a short bridging refill in setting of holiday which she will fill tonight and will plan to follow-up with PCP.  Given strict return precautions and discharged in stable condition.

## 2023-01-23 MED ORDER — OLANZAPINE 2.5 MG TABLET
ORAL_TABLET | Freq: Every evening | ORAL | 0 refills | 0.00 days
Start: 2023-01-23 — End: ?

## 2023-01-24 MED ORDER — OLANZAPINE 2.5 MG TABLET
ORAL_TABLET | Freq: Every evening | ORAL | 0 refills | 30.00 days | Status: CP
Start: 2023-01-24 — End: ?

## 2023-01-24 NOTE — Unmapped (Signed)
CBC resulted to Multimedia programmer. Per dr. Noralee Stain, [facility], no further action indicated for this result on behalf of ED.

## 2023-01-25 MED ORDER — OLANZAPINE 2.5 MG TABLET
ORAL_TABLET | Freq: Every evening | ORAL | 0 refills | 30.00 days
Start: 2023-01-25 — End: ?

## 2023-02-11 ENCOUNTER — Telehealth: Payer: Medicare Other | Admitting: Anesthesiology

## 2023-02-12 ENCOUNTER — Ambulatory Visit: Payer: Medicare Other | Attending: Anesthesiology | Admitting: Anesthesiology

## 2023-02-12 ENCOUNTER — Encounter: Payer: Self-pay | Admitting: Anesthesiology

## 2023-02-12 ENCOUNTER — Inpatient Hospital Stay: Admit: 2023-02-12 | Discharge: 2023-02-13 | Payer: MEDICARE

## 2023-02-12 DIAGNOSIS — K5090 Crohn's disease, unspecified, without complications: Secondary | ICD-10-CM | POA: Diagnosis not present

## 2023-02-12 DIAGNOSIS — M542 Cervicalgia: Secondary | ICD-10-CM | POA: Diagnosis not present

## 2023-02-12 DIAGNOSIS — G894 Chronic pain syndrome: Secondary | ICD-10-CM | POA: Diagnosis not present

## 2023-02-12 DIAGNOSIS — M47816 Spondylosis without myelopathy or radiculopathy, lumbar region: Secondary | ICD-10-CM

## 2023-02-12 DIAGNOSIS — M5412 Radiculopathy, cervical region: Secondary | ICD-10-CM

## 2023-02-12 DIAGNOSIS — R109 Unspecified abdominal pain: Secondary | ICD-10-CM

## 2023-02-12 DIAGNOSIS — Z79891 Long term (current) use of opiate analgesic: Secondary | ICD-10-CM

## 2023-02-12 DIAGNOSIS — F1190 Opioid use, unspecified, uncomplicated: Secondary | ICD-10-CM

## 2023-02-12 DIAGNOSIS — G8929 Other chronic pain: Secondary | ICD-10-CM

## 2023-02-12 DIAGNOSIS — M25511 Pain in right shoulder: Secondary | ICD-10-CM

## 2023-02-12 MED ORDER — OXYCODONE-ACETAMINOPHEN 10-325 MG PO TABS: 1.0000 | ORAL_TABLET | Freq: Three times a day (TID) | ORAL | 0 refills | Status: DC | PRN

## 2023-02-12 MED ORDER — OXYCODONE-ACETAMINOPHEN 10-325 MG PO TABS: 1.0000 | ORAL_TABLET | Freq: Three times a day (TID) | ORAL | 0 refills | Status: AC | PRN

## 2023-02-12 NOTE — Progress Notes (Signed)
 Virtual Visit via Video Note  I connected with Benjamin Murillo on 02/12/23 at  4:00 PM EST by a video enabled telemedicine application and verified that I am speaking with the correct person using two identifiers.  Location: Patient: Home Provider: Pain control center   I discussed the limitations of evaluation and management by telemedicine and the availability of in person appointments. The patient expressed understanding and agreed to proceed.  History of Present Illness: I spoke with Benjamin Murillo via telephone.  We are unable link for the video portion of the conference.  He reports that he has been doing reasonably well with his neck and low back pain.  He has also had some recent exacerbation of his chronic disease requiring ER evaluation secondary to abdominal pain.  Overall he is doing reasonably well with the Percocet.  He has had to take 4 tablets on a few days secondary to the severity of pain.  He has been on chronic opioid therapy for an extended period of time.  The medicines still work well for him.  He gets about 70 to 80% relief from the abdominal pain and neck and back pain.  The quality characteristic and distribution of these have been stable in nature with no recent changes.  He still having some right shoulder pain and occasional numbness affecting the right arm but no weakness affecting the right arm and he reports that he is due for evaluation with his primary care physicians for that.  Otherwise that has been stable with no recent change.  He is trying to stay active but the cold weather has been aggravating some of his baseline pain.  He is feeling better from the recent Crohn's exacerbation that prompted the ER evaluation.  No change in lower extremity or upper extremity strength or function bowel or bladder function is noted at this time.  Review of systems: General: No fevers or chills Pulmonary: No shortness of breath or dyspnea Cardiac: No angina or palpitations or  lightheadedness GI: No abdominal pain or constipation Psych: No depression    Observations/Objective:  Current Outpatient Medications:    [START ON 02/21/2023] oxyCODONE -acetaminophen  (PERCOCET) 10-325 MG tablet, Take 1 tablet by mouth every 8 (eight) hours as needed for pain., Disp: 90 tablet, Rfl: 0   [START ON 03/23/2023] oxyCODONE -acetaminophen  (PERCOCET) 10-325 MG tablet, Take 1 tablet by mouth every 8 (eight) hours as needed for pain., Disp: 90 tablet, Rfl: 0   amlodipine-atorvastatin (CADUET) 10-10 MG tablet, Take 1 tablet by mouth daily., Disp: , Rfl:    citalopram (CELEXA) 10 MG tablet, Take 10 mg by mouth daily as needed., Disp: , Rfl:    dronabinol (MARINOL) 10 MG capsule, Take 10 mg by mouth 2 (two) times daily before a meal., Disp: , Rfl:    erythromycin  ophthalmic ointment, Place 1 Application into the left eye at bedtime., Disp: 3.5 g, Rfl: 0   famotidine  (PEPCID ) 20 MG tablet, Take 20 mg by mouth 2 (two) times daily., Disp: , Rfl:    gabapentin  (NEURONTIN ) 100 MG capsule, Take 1 capsule (100 mg total) by mouth at bedtime., Disp: 30 capsule, Rfl: 3   gabapentin  (NEURONTIN ) 300 MG capsule, Take 1 capsule (300 mg total) by mouth 3 (three) times daily., Disp: 90 capsule, Rfl: 3   hyoscyamine  (LEVSIN, ANASPAZ ) 0.125 MG tablet, Take 0.125 mg by mouth as needed. , Disp: , Rfl:    loperamide (IMODIUM A-D) 2 MG tablet, Take 2 mg by mouth as needed., Disp: , Rfl:  naloxone  (NARCAN ) nasal spray 4 mg/0.1 mL, For respiratory depression from opioids, Disp: 1 kit, Rfl: 2   oxyCODONE -acetaminophen  (PERCOCET) 10-325 MG tablet, Take 1 tablet by mouth every 6 (six) hours as needed for pain., Disp: 90 tablet, Rfl: 0   predniSONE  (STERAPRED UNI-PAK 21 TAB) 10 MG (21) TBPK tablet, 6 tablets day one and day two, five tablets day three and day four, and daily taper over 12days as directed, Disp: 42 tablet, Rfl: 0   prochlorperazine (COMPAZINE) 5 MG tablet, Take 5 mg by mouth every 8 (eight) hours as  needed., Disp: , Rfl:    STELARA 90 MG/ML SOSY injection, Inject 90 mg as directed., Disp: , Rfl:    Past Medical History:  Diagnosis Date   Cervical radiculitis 12/22/2019   Cervical radiculopathy    Chronic abdominal pain    Chronic neck pain    Cold    Crohn disease (HCC)    GERD (gastroesophageal reflux disease)    no meds-2-3 times weekly   Hand fracture, left    Headache(784.0)    Hypertension    no meds    IBS (irritable bowel syndrome)    Shortness of breath    "sometimes laying down; sometimes w/activity" (10/13/2012)   Assessment and Plan:  1. Cervicalgia   2. Crohn's disease without complication, unspecified gastrointestinal tract location (HCC)   3. Chronic pain syndrome   4. Chronic, continuous use of opioids   5. Chronic abdominal pain   6. Facet arthritis of lumbar region   7. Chronic neck and back pain   8. Cervical radiculitis   9. Acute pain of right shoulder    Based on conversation and upon review of the [facility]  practitioner database information is appropriate to refill his medicines for the next 2 months.  This be dated for January 24 which will be 2 days early for him and February 23.  I do not feel other changes in his pharmacologic regimen need to be initiated at this time.  He is doing well with this regimen.  Continue gabapentin  up to 300 mg 3 times a day dosing regimen.  He is using Marinol for intermittent abdominal pain we will schedule him for return to clinic in 2 months.  Continue follow-up with his primary care physicians and gastroenterologist for baseline condition. Follow Up Instructions:    I discussed the assessment and treatment plan with the patient. The patient was provided an opportunity to ask questions and all were answered. The patient agreed with the plan and demonstrated an understanding of the instructions.   The patient was advised to call back or seek an in-person evaluation if the symptoms worsen or if the condition  fails to improve as anticipated.  I provided 30 minutes of non-face-to-face time during this encounter.   Zula Hitch, MD

## 2023-02-12 NOTE — Unmapped (Unsigned)
[facility]  Faculty Established Follow-up Visit Note    REFERRING PROVIDER: Corky Mull, MD  [address]  [place],  [place] [address]    PRIMARY CARE PROVIDER: Corky Mull, MD    Assessment and Plan:  Rodney Bender is a 53 y.o. 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    There are no diagnoses linked to this encounter.    There are no Patient Instructions on file for this visit.    No follow-ups on file.    I personally spent 10 minutes face-to-face and non-face-to-face in the care of this patient, which includes all pre, intra, and post visit time on the date of service.  All documented time was specific to the E/M visit and does not include any procedures that may have been performed.     Rodney Slayton C. Elizebeth Brooking, MD MPH  Clinical Assistant Professor  Gastroenterology and Hepatology     -------------------------        History of Present Illness:      Rodney Bender is a 53 y.o. 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    Interval history:  ***    Past Medical History:   Diagnosis Date    Acid reflux     Anxiety     Crohn's disease (CMS-HCC)     diagnosed in 1990    GERD (gastroesophageal reflux disease)     Hypertension 10/08/2016     Past Surgical History:  Ileocolectomy with anastomosis and multiple IR drains about 5 cm ileum and colon (09/16/2016)  Ileocolectomy about 15 cm (2009)  Ileocecectomy unclear extent (1998)     Current Outpatient Medications on File Prior to Visit   Medication Sig Dispense Refill    busPIRone (BUSPAR) 10 MG tablet Take 2 tablets (20 mg total) by mouth Three (3) times a day. 180 tablet 2    carisoprodol (SOMA) 350 MG tablet       citalopram (CELEXA) 40 MG tablet Take 1 tablet (40 mg total) by mouth daily. 30 tablet 11    cyclobenzaprine (FLEXERIL) 5 MG tablet       dicyclomine (BENTYL) 20 mg tablet       dronabinol (MARINOL) 10 MG capsule Take 1 capsule (10 mg total) by mouth Two (2) times a day (30 minutes before a meal). 60 capsule 0    erythromycin (ROMYCIN) 5 mg/gram (0.5 %) ophthalmic ointment Apply 1 application. (1 Application total) to eye.      ferrous sulfate 325 (65 FE) MG tablet TAKE 1 TABLET BY MOUTH EVERY OTHER DAY 60 tablet 0    gabapentin (NEURONTIN) 300 MG capsule TAKE 2 CAPSULES BY MOUTH THREE TIMES DAILY 180 capsule 5    HYDROcodone-acetaminophen (NORCO) 5-325 mg per tablet take 1 to 2 tablets by mouth every 6 hours if needed      hydrOXYzine (ATARAX) 25 MG tablet Take 1 tablet (25 mg total) by mouth Three (3) times a day as needed for anxiety. 90 tablet 3    loperamide (IMODIUM A-D) 2 mg tablet Take 1 tablet (2 mg total) by mouth daily as needed.      meloxicam (MOBIC) 7.5 MG tablet Take 1 tablet twice a day by oral route with meals.  naloxone (NARCAN) 4 mg nasal spray       OLANZapine (ZYPREXA) 2.5 MG tablet Take 1 tablet by mouth nightly 30 tablet 0    ondansetron (ZOFRAN-ODT) 4 MG disintegrating tablet Dissolve 2 tablets (8 mg total) in the mouth every eight (8) hours as needed for nausea. 45 tablet 11    [Paused] oxyCODONE-acetaminophen (PERCOCET) 10-325 mg per tablet Take 1 tablet by mouth every eight (8) hours as needed.      [EXPIRED] oxyCODONE-acetaminophen (PERCOCET) 10-325 mg per tablet Take 1 tablet by mouth every four (4) hours as needed for pain. 10 tablet 0    pantoprazole (PROTONIX) 40 MG tablet Take 1 tablet (40 mg total) by mouth Two (2) times a day (30 minutes before a meal). 180 tablet 3    promethazine (PHENERGAN) 25 MG tablet Take 2 tablets (50 mg total) by mouth every six (6) hours as needed for nausea. 45 tablet 11    sertraline (ZOLOFT) 50 MG tablet       testosterone 20.25 mg/1.25 gram (1.62 %) gel pump Place 2 sprays (40.5 mg total) on the skin daily. 75 g 5 therapeutic multivitamin (THERAGRAN) tablet Take 1 tablet by mouth daily. 90 tablet 3    ustekinumab (STELARA) 90 mg/mL Syrg syringe Inject the contents of 1 syringe (90 mg total) under the skin every 8 weeks. 1 mL 5     No current facility-administered medications on file prior to visit.     Allergies  Reviewed on 01/21/2023        Reactions Comments    Tramadol Itching, Anxiety Other reaction(s): Other (See Comments)    Penicillins  Other reaction(s): Itching-Allergy    Dilaudid [hydromorphone] Anxiety     Morphine Itching High doses            Family History   Problem Relation Age of Onset    Hyperlipidemia Father     Cancer Father         kidney    Cancer Maternal Aunt     Stroke Mother     No Known Problems Sister     No Known Problems Brother     No Known Problems Maternal Uncle     No Known Problems Paternal Aunt     No Known Problems Paternal Uncle     No Known Problems Maternal Grandmother     No Known Problems Maternal Grandfather     No Known Problems Paternal Grandmother     No Known Problems Paternal Grandfather     Anesthesia problems Neg Hx     Broken bones Neg Hx     Clotting disorder Neg Hx     Collagen disease Neg Hx     Diabetes Neg Hx     Dislocations Neg Hx     Fibromyalgia Neg Hx     Gout Neg Hx     Hemophilia Neg Hx     Osteoporosis Neg Hx     Rheumatologic disease Neg Hx     Scoliosis Neg Hx     Severe sprains Neg Hx     Sickle cell anemia Neg Hx     Spinal Compression Fracture Neg Hx      Social History     Tobacco Use    Smoking status: Former     Current packs/day: 0.00     Average packs/day: 1 pack/day for 18.0 years (18.0 ttl pk-yrs)     Types: Cigarettes     Start date: 08/27/2003  Quit date: 06/06/2017     Years since quitting: 5.6    Smokeless tobacco: Never    Tobacco comments:     Pt smokes 1ppd, Pt is interested in tobacco cessation    Vaping Use    Vaping status: Never Used   Substance Use Topics    Alcohol use: Not Currently    Drug use: Not Currently     Types: Marijuana Review of Systems:  The balance of 12 systems reviewed is negative except as noted in the history of present illness.    Vital Signs: There were no vitals taken for this visit.    Wt Readings from Last 6 Encounters:   01/21/23 79 kg (174 lb 2.6 oz)   11/18/22 70.3 kg (155 lb)   09/20/22 70 kg (154 lb 6.4 oz)   03/04/22 67.7 kg (149 lb 3.2 oz)   03/01/22 67.4 kg (148 lb 9.6 oz)   01/04/22 66 kg (145 lb 9.6 oz)     Review of Systems:  General appearance: Appears well, no distress and cachectic.  Eyes: Anicteric sclera. No erythema.  ENT: No oral ulcers. MMM  Cardiovascular: RRR, WWP. No lower extremity edema.  Pulmonary: Normal work of breathing. Acyanotic.  Abdominal: soft, tender epigastrium, nondistended, no masses or organomegaly.  Musculoskeletal: No temporal wasting. Normal joints of the hand.  Skin: No jaundice. No rashes.  Neurologic: Alert, oriented, and appropriate.  Psychiatric: Appropriate.    Data Review:  Review of labs found:  10/24/21 - Hgb 11.6, nl LFTs and Cr, ferritin 87    Review of endoscopy found:  Colonoscopy - 10/18/21 - i0 at anastomosis, SES-CD 1  Upper endoscopy - 10/18/21 - nl  Colonoscopy - 05/15/2020 - i1 at anastomosis, SESCD 3, one 2 mm polyp  Colonoscopy - 02/12/2019 - i1 at anastomosis, no more proximal ileal inflammation  Colonoscopy - 04/06/2018 - i0 at anastomosis  Colonoscopy - 03/21/2017 - i1 at anastomosis, one 2mm polup  Colonoscopy - 08/09/2016 - occlusive ulcerated stricture at anastomosis   Upper endoscopy - 07/15/2014 - nl  Colonoscopy - 07/15/2014 - inflammed anastomosis dilated to 12-29mm, scattered apthae distal ileum  Colonoscopy - 07/21/2013 - i0 anastomosis, nl TI, hemorrhoids  Upper endoscopy - 07/21/2013 - nl  Upper endoscopy - 11/10/2012 - nl  Colonoscopy - 11/10/2012 - traversable anastomosis with i4 inflammation, and ulcerations in the TI  Note procedures on record back to 06/12/1998    Review of imaging found:  CT abdomen/pelvis w contrast - multiple air fluid levels in colon, nonspecific but c/w diarrheal illness  CT enterography - 04/07/2020 - scattered small bowel narrowing without adjacent inflammatory stranding  CT abdomen/pelvis w contrast - 02/08/2019 - Diffuse small bowel wall thickening is seen with surrounding free fluid suggestive of active inflammation  CT abdomen/pelvis w contrast - 12/20/2017 - Mucosal thickening and enhancement in the left hemiabdomen small bowel with associated engorged mesenteric vessels. Mucosal wall thickening in the sigmoid colon and rectum  CT abdomen/pelvis w IV and oral contrast - 03/12/2017 - Status post ileocecectomy, unremarkable  Note exams on record back to 06/16/1998  .  Review of other studies found:  None

## 2023-02-14 ENCOUNTER — Ambulatory Visit: Admit: 2023-02-14 | Discharge: 2023-02-15 | Payer: MEDICARE

## 2023-02-14 ENCOUNTER — Ambulatory Visit
Admit: 2023-02-14 | Discharge: 2023-02-15 | Payer: MEDICARE | Attending: Foot and Ankle Surgery | Primary: Foot and Ankle Surgery

## 2023-02-14 NOTE — Unmapped (Signed)
[facility] ORTHOPAEDICS Telephone Call    Date: 02/14/2023   Attending: Oswald Hillock, MD      Primary Care Physician: Corky Mull, MD   Referring Provider: Corky Mull, MD    Sandor Arboleda is a 53 y.o. male with the following visit diagnoses:    ICD-10-CM   1. Plantar fasciitis of right foot  M72.2    Orthopaedic notes: [id number]  Crohn's. Substance/Tob abuse.   H/o C6-7 ACDF  MR-C 01/2018 - Minimal L.C5-6 foram stenosis.    I spent 3 minutes on the phone with the patient.   No E&M was billed for this visit.     Notes from conversation:   53 y.o. male    MRI without edema at plantar heel.  Exam at previous clinic visit only with plantar foot/heel pain.  No previous history of lumbar radiculopathy.  Discussed option of [place] eval--I told him that given MRI findings, likelihood is lower that [place] would have findings.  He would still like to have visit with [place] specialist.  Referral sent.

## 2023-02-20 DIAGNOSIS — K5000 Crohn's disease of small intestine without complications: Principal | ICD-10-CM

## 2023-02-20 MED ORDER — STELARA 90 MG/ML SUBCUTANEOUS SYRINGE
SUBCUTANEOUS | 5 refills | 56.00 days
Start: 2023-02-20 — End: ?

## 2023-02-21 MED ORDER — USTEKINUMAB 90 MG/ML SUBCUTANEOUS SYRINGE
SUBCUTANEOUS | 5 refills | 56.00 days | Status: CP
Start: 2023-02-21 — End: ?
  Filled 2023-04-10: qty 1, 56d supply, fill #0

## 2023-02-21 MED ORDER — OLANZAPINE 2.5 MG TABLET
ORAL_TABLET | Freq: Every evening | ORAL | 0 refills | 0.00 days
Start: 2023-02-21 — End: ?

## 2023-02-21 NOTE — Unmapped (Signed)
Refilling Stelara per protocol. Last seen 08/2022 in clinic and 10/2022 in procedure. Appt next month in clinic.

## 2023-02-28 MED ORDER — OLANZAPINE 2.5 MG TABLET
ORAL_TABLET | Freq: Every evening | ORAL | 0 refills | 30.00 days
Start: 2023-02-28 — End: ?

## 2023-02-28 NOTE — Unmapped (Signed)
Declining duplicate request. Dr. Elizebeth Brooking re-ordered olanzapine 02/21/23.

## 2023-03-17 MED ORDER — DRONABINOL 10 MG CAPSULE
ORAL_CAPSULE | Freq: Two times a day (BID) | ORAL | 0 refills | 30.00 days | Status: CP
Start: 2023-03-17 — End: ?

## 2023-03-21 MED ORDER — OLANZAPINE 2.5 MG TABLET
ORAL_TABLET | Freq: Every evening | ORAL | 0 refills | 0.00 days
Start: 2023-03-21 — End: ?

## 2023-03-26 MED ORDER — OLANZAPINE 2.5 MG TABLET
ORAL_TABLET | Freq: Every evening | ORAL | 0 refills | 30.00 days | Status: CP
Start: 2023-03-26 — End: ?

## 2023-03-28 ENCOUNTER — Ambulatory Visit: Admit: 2023-03-28 | Payer: MEDICARE

## 2023-03-28 NOTE — Unmapped (Unsigned)
 [facility] at [facility]  Faculty Established Follow-up Visit Note    REFERRING PROVIDER: Hunt Oris, MD  [address]  [place],  [place] [address]    PRIMARY CARE PROVIDER: Corky Mull, MD    Assessment and Plan:  Rodney Bender is a 53 y.o. 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    Historically he has had very high risk disease with an ileocecectomy and multiple revisions of the anastomosis losing a reasonable amount of small bowel though substantially less than 50%. Incomplete adherence was probably a factor early on. He struggles to maintain his weight and we have tried multiple appetite stimulants for some time, holding our own but not gaining significant weight either.  - Continue stelara every 8 weeks  - Continue pantoprazole 40 mg once daily 30 minutes before meal  - Continue gabapentin to 600 mg 3 times daily  - Continue Marinol    There are no Patient Instructions on file for this visit.    No follow-ups on file.    I personally spent 10 minutes face-to-face and non-face-to-face in the care of this patient, which includes all pre, intra, and post visit time on the date of service.  All documented time was specific to the E/M visit and does not include any procedures that may have been performed.     Rodney Bender C. Elizebeth Brooking, MD MPH  Clinical Assistant Professor  Gastroenterology and Hepatology     -------------------------        History of Present Illness:      Rodney Bender is a 53 y.o. 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    Interval history:  ***    Past Medical History:   Diagnosis Date    Acid reflux     Anxiety     Crohn's disease (CMS-HCC)     diagnosed in 1990    GERD (gastroesophageal reflux disease)     Hypertension 10/08/2016     Past Surgical History:  Ileocolectomy with anastomosis and multiple IR drains about 5 cm ileum and colon (09/16/2016)  Ileocolectomy about 15 cm (2009)  Ileocecectomy unclear extent (1998)     Current Outpatient Medications on File Prior to Visit   Medication Sig Dispense Refill    busPIRone (BUSPAR) 10 MG tablet Take 2 tablets (20 mg total) by mouth Three (3) times a day. 180 tablet 2    carisoprodol (SOMA) 350 MG tablet       citalopram (CELEXA) 40 MG tablet Take 1 tablet (40 mg total) by mouth daily. 30 tablet 11    cyclobenzaprine (FLEXERIL) 5 MG tablet       dicyclomine (BENTYL) 20 mg tablet       dronabinol (MARINOL) 10 MG capsule Take 1 capsule (10 mg total) by mouth Two (2) times a day (30 minutes before a meal). 60 capsule 0    erythromycin (ROMYCIN) 5 mg/gram (0.5 %) ophthalmic ointment Apply 1 application. (1 Application total) to eye.      ferrous sulfate 325 (65 FE) MG tablet TAKE 1 TABLET BY MOUTH EVERY OTHER DAY 60 tablet 0    gabapentin (NEURONTIN) 300 MG capsule TAKE 2 CAPSULES BY MOUTH THREE TIMES DAILY 180 capsule 5    HYDROcodone-acetaminophen (NORCO) 5-325  mg per tablet take 1 to 2 tablets by mouth every 6 hours if needed      hydrOXYzine (ATARAX) 25 MG tablet Take 1 tablet (25 mg total) by mouth Three (3) times a day as needed for anxiety. 90 tablet 3    loperamide (IMODIUM A-D) 2 mg tablet Take 1 tablet (2 mg total) by mouth daily as needed.      meloxicam (MOBIC) 7.5 MG tablet Take 1 tablet twice a day by oral route with meals.      naloxone (NARCAN) 4 mg nasal spray       OLANZapine (ZYPREXA) 2.5 MG tablet Take 1 tablet by mouth nightly 30 tablet 0    ondansetron (ZOFRAN-ODT) 4 MG disintegrating tablet Dissolve 2 tablets (8 mg total) in the mouth every eight (8) hours as needed for nausea. 45 tablet 11    [Paused] oxyCODONE-acetaminophen (PERCOCET) 10-325 mg per tablet Take 1 tablet by mouth every eight (8) hours as needed.      pantoprazole (PROTONIX) 40 MG tablet Take 1 tablet (40 mg total) by mouth Two (2) times a day (30 minutes before a meal). 180 tablet 3    promethazine (PHENERGAN) 25 MG tablet Take 2 tablets (50 mg total) by mouth every six (6) hours as needed for nausea. 45 tablet 11    sertraline (ZOLOFT) 50 MG tablet       testosterone 20.25 mg/1.25 gram (1.62 %) gel pump Place 2 sprays (40.5 mg total) on the skin daily. 75 g 5    therapeutic multivitamin (THERAGRAN) tablet Take 1 tablet by mouth daily. 90 tablet 3    ustekinumab (STELARA) 90 mg/mL Syrg syringe Inject the contents of 1 syringe (90 mg total) under the skin every 8 weeks. 1 mL 5     No current facility-administered medications on file prior to visit.     Allergies  Reviewed on 01/21/2023        Reactions Comments    Tramadol Itching, Anxiety Other reaction(s): Other (See Comments)    Penicillins  Other reaction(s): Itching-Allergy    Dilaudid [hydromorphone] Anxiety     Morphine Itching High doses            Family History   Problem Relation Age of Onset    Hyperlipidemia Father     Cancer Father         kidney    Cancer Maternal Aunt     Stroke Mother     No Known Problems Sister     No Known Problems Brother     No Known Problems Maternal Uncle     No Known Problems Paternal Aunt     No Known Problems Paternal Uncle     No Known Problems Maternal Grandmother     No Known Problems Maternal Grandfather     No Known Problems Paternal Grandmother     No Known Problems Paternal Grandfather     Anesthesia problems Neg Hx     Broken bones Neg Hx     Clotting disorder Neg Hx     Collagen disease Neg Hx     Diabetes Neg Hx     Dislocations Neg Hx     Fibromyalgia Neg Hx     Gout Neg Hx     Hemophilia Neg Hx     Osteoporosis Neg Hx     Rheumatologic disease Neg Hx     Scoliosis Neg Hx     Severe sprains Neg Hx     Sickle  cell anemia Neg Hx     Spinal Compression Fracture Neg Hx      Social History     Tobacco Use    Smoking status: Former     Current packs/day: 0.00     Average packs/day: 1 pack/day for 18.0 years (18.0 ttl pk-yrs)     Types: Cigarettes     Start date: 08/27/2003     Quit date: 06/06/2017     Years since quitting: 5.8    Smokeless tobacco: Never    Tobacco comments:     Pt smokes 1ppd, Pt is interested in tobacco cessation    Vaping Use    Vaping status: Never Used   Substance Use Topics    Alcohol use: Not Currently    Drug use: Not Currently     Types: Marijuana     Review of Systems:  The balance of 12 systems reviewed is negative except as noted in the history of present illness.    Vital Signs: There were no vitals taken for this visit.    Wt Readings from Last 6 Encounters:   01/21/23 79 kg (174 lb 2.6 oz)   11/18/22 70.3 kg (155 lb)   09/20/22 70 kg (154 lb 6.4 oz)   03/04/22 67.7 kg (149 lb 3.2 oz)   03/01/22 67.4 kg (148 lb 9.6 oz)   01/04/22 66 kg (145 lb 9.6 oz)     Review of Systems:  General appearance: Appears well, no distress and cachectic.  Eyes: Anicteric sclera. No erythema.  ENT: No oral ulcers. MMM  Cardiovascular: RRR, WWP. No lower extremity edema.  Pulmonary: Normal work of breathing. Acyanotic.  Abdominal: soft, tender epigastrium, nondistended, no masses or organomegaly.  Musculoskeletal: No temporal wasting. Normal joints of the hand.  Skin: No jaundice. No rashes.  Neurologic: Alert, oriented, and appropriate.  Psychiatric: Appropriate.    Data Review:  Review of labs found:  Mild normocytic anemia, mild AP elevation, CRP 11 in December, 2024.    Review of endoscopy found:  Upper endoscopy - 11/18/2022 - Schatzki ring, negative FLIP  Colonoscopy - 10/18/21 - i0 at anastomosis, SES-CD 1  Upper endoscopy - 10/18/21 - nl  Colonoscopy - 05/15/2020 - i1 at anastomosis, SESCD 3, one 2 mm polyp  Colonoscopy - 02/12/2019 - i1 at anastomosis, no more proximal ileal inflammation  Colonoscopy - 04/06/2018 - i0 at anastomosis  Colonoscopy - 03/21/2017 - i1 at anastomosis, one 2mm polup  Colonoscopy - 08/09/2016 - occlusive ulcerated stricture at anastomosis   Upper endoscopy - 07/15/2014 - nl  Colonoscopy - 07/15/2014 - inflammed anastomosis dilated to 12-61mm, scattered apthae distal ileum  Colonoscopy - 07/21/2013 - i0 anastomosis, nl TI, hemorrhoids  Upper endoscopy - 07/21/2013 - nl  Upper endoscopy - 11/10/2012 - nl  Colonoscopy - 11/10/2012 - traversable anastomosis with i4 inflammation, and ulcerations in the TI  Note procedures on record back to 06/12/1998    Review of imaging found:  CT abdomen/pelvis w contrast - multiple air fluid levels in colon, nonspecific but c/w diarrheal illness  CT enterography - 04/07/2020 - scattered small bowel narrowing without adjacent inflammatory stranding  CT abdomen/pelvis w contrast - 02/08/2019 - Diffuse small bowel wall thickening is seen with surrounding free fluid suggestive of active inflammation  CT abdomen/pelvis w contrast - 12/20/2017 - Mucosal thickening and enhancement in the left hemiabdomen small bowel with associated engorged mesenteric vessels. Mucosal wall thickening in the sigmoid colon and rectum  CT abdomen/pelvis w IV and oral  contrast - 03/12/2017 - Status post ileocecectomy, unremarkable  Note exams on record back to 06/16/1998  .  Review of other studies found:  None

## 2023-04-08 ENCOUNTER — Telehealth: Payer: Self-pay | Admitting: Anesthesiology

## 2023-04-08 ENCOUNTER — Ambulatory Visit: Admit: 2023-04-08 | Payer: MEDICARE | Attending: Emergency Medicine | Primary: Emergency Medicine

## 2023-04-08 NOTE — Telephone Encounter (Signed)
 Spoke with Benjamin Murillo and informed her that it is our policy that they can prescribe pain medications for this patient the same as if they would prescribe for any other patient.  Informed her that the patient is not to take our chronic pain medication to treat pain from the acute issue.  Surgeon letter faxed to [place] at [phone].

## 2023-04-08 NOTE — Telephone Encounter (Signed)
 PT is at the dentist to get pulling six teeth out. Please give Benjamin Murillo a call [phone] the office need approve to prescribe the patient some medications for pain. TY

## 2023-04-08 NOTE — Unmapped (Signed)
 [facility] Refill Coordination Note    Specialty Medication(s) to be Shipped:   Inflammatory Disorders: Stelara    Other medication(s) to be shipped: No additional medications requested for fill at this time     Rodney Bender, DOB: 12-28-1970  Phone: [phone] (home)       All above HIPAA information was verified with patient.     Was a Nurse, learning disability used for this call? No    Completed refill call assessment today to schedule patient's medication shipment from the [facility]  [phone]).  All relevant notes have been reviewed.     Specialty medication(s) and dose(s) confirmed: Regimen is correct and unchanged.   Changes to medications: Rodney Bender reports no changes at this time.  Changes to insurance: No  New side effects reported not previously addressed with a pharmacist or physician: None reported  Questions for the pharmacist: No    Confirmed patient received a Conservation officer, historic buildings and a Surveyor, mining with first shipment. The patient will receive a drug information handout for each medication shipped and additional FDA Medication Guides as required.       DISEASE/MEDICATION-SPECIFIC INFORMATION        For patients on injectable medications: Patient currently has 0 doses left.  Next injection is scheduled for overdue.    SPECIALTY MEDICATION ADHERENCE     Medication Adherence    Patient reported X missed doses in the last month: 0  Specialty Medication: STELARA 90 mg/mL  Patient is on additional specialty medications: No              Were doses missed due to medication being on hold? No    Stelara 90 mg/ml: 0 doses of medicine on hand        REFERRAL TO PHARMACIST     Referral to the pharmacist: Not needed      [facility] ([address])     Shipping address confirmed in Epic.     Cost and Payment: Patient has a copay of $4.80. They are aware and have authorized the pharmacy to charge the credit card on file.    Delivery Scheduled: Yes, Expected medication delivery date: 04/10/23.     Medication will be delivered via Same Day Courier to the prescription address in Epic [place].    Rodney Bender   [facility]  Specialty Technician

## 2023-04-22 ENCOUNTER — Ambulatory Visit: Attending: Anesthesiology | Admitting: Anesthesiology

## 2023-04-22 ENCOUNTER — Encounter: Payer: Self-pay | Admitting: Anesthesiology

## 2023-04-22 VITALS — BP 108/74 | HR 74 | Temp 97.9°F | Resp 14 | Ht 71.0 in | Wt 165.0 lb

## 2023-04-22 DIAGNOSIS — M25511 Pain in right shoulder: Secondary | ICD-10-CM

## 2023-04-22 DIAGNOSIS — G894 Chronic pain syndrome: Secondary | ICD-10-CM | POA: Diagnosis not present

## 2023-04-22 DIAGNOSIS — M5412 Radiculopathy, cervical region: Secondary | ICD-10-CM | POA: Diagnosis present

## 2023-04-22 DIAGNOSIS — K5090 Crohn's disease, unspecified, without complications: Secondary | ICD-10-CM | POA: Diagnosis not present

## 2023-04-22 DIAGNOSIS — M47816 Spondylosis without myelopathy or radiculopathy, lumbar region: Secondary | ICD-10-CM

## 2023-04-22 DIAGNOSIS — Z79891 Long term (current) use of opiate analgesic: Secondary | ICD-10-CM

## 2023-04-22 DIAGNOSIS — G8929 Other chronic pain: Secondary | ICD-10-CM | POA: Diagnosis present

## 2023-04-22 DIAGNOSIS — F1190 Opioid use, unspecified, uncomplicated: Secondary | ICD-10-CM | POA: Diagnosis present

## 2023-04-22 DIAGNOSIS — M549 Dorsalgia, unspecified: Secondary | ICD-10-CM | POA: Insufficient documentation

## 2023-04-22 DIAGNOSIS — R109 Unspecified abdominal pain: Secondary | ICD-10-CM | POA: Diagnosis not present

## 2023-04-22 DIAGNOSIS — M542 Cervicalgia: Secondary | ICD-10-CM

## 2023-04-22 MED ORDER — OXYCODONE-ACETAMINOPHEN 10-325 MG PO TABS: 1.0000 | ORAL_TABLET | Freq: Three times a day (TID) | ORAL | 0 refills | Status: DC | PRN

## 2023-04-22 MED ORDER — OXYCODONE-ACETAMINOPHEN 10-325 MG PO TABS: 1.0000 | ORAL_TABLET | Freq: Three times a day (TID) | ORAL | 0 refills | Status: AC | PRN

## 2023-04-22 NOTE — Progress Notes (Signed)
 Nursing Pain Medication Assessment:  Safety precautions to be maintained throughout the outpatient stay will include: orient to surroundings, keep bed in low position, maintain call bell within reach at all times, provide assistance with transfer out of bed and ambulation.  Medication Inspection Compliance: Pill count conducted under aseptic conditions, in front of the patient. Neither the pills nor the bottle was removed from the patient's sight at any time. Once count was completed pills were immediately returned to the patient in their original bottle.  Medication: Oxycodone/APAP Pill/Patch Count:  0 of 90 pills remain Pill/Patch Appearance: Markings consistent with prescribed medication Bottle Appearance: Standard pharmacy container. Clearly labeled. Filled Date: 02 / 23 / 2025 Last Medication intake:  TodaySafety precautions to be maintained throughout the outpatient stay will include: orient to surroundings, keep bed in low position, maintain call bell within reach at all times, provide assistance with transfer out of bed and ambulation.

## 2023-04-22 NOTE — Progress Notes (Signed)
 Subjective:  Patient ID: Benjamin Murillo, male    DOB: 12-01-1970  Age: 53 y.o. MRN: [id number]  CC: Abdominal Pain   Procedure: None  HPI Benjamin Murillo presents for reevaluation.  He was last evaluated 2 months ago and is continue to have primarily low back pain with chronic abdominal pain followed by GI.  He takes chronic opioid therapy secondary to the diffuse body pain he has and he has been on this for a prolonged period of time but continuing to derive good functional benefit whereas he has failed more conservative therapy.  He currently takes his opioid medications 3 times a day and this is continuing to keep his pain under good control.  He generally gets 70% relief lasting 4 to 6 hours before he has recurrence of his baseline pain.  He does take Marinol prescribed for his chronic pain.  Otherwise he reports that he is in his usual state of health.  He feels that his right shoulder pain is better and he is no longer experiencing any weakness in the right arm.  The quality characteristic and distribution of the back pain and abdominal pain are stable in nature.  He continues to follow along with GI.  Outpatient Medications Prior to Visit  Medication Sig Dispense Refill   dronabinol (MARINOL) 10 MG capsule Take 10 mg by mouth 2 (two) times daily before a meal.     erythromycin ophthalmic ointment Place 1 Application into the left eye at bedtime. 3.5 g 0   famotidine (PEPCID) 20 MG tablet Take 20 mg by mouth 2 (two) times daily.     gabapentin (NEURONTIN) 300 MG capsule Take 1 capsule (300 mg total) by mouth 3 (three) times daily. 90 capsule 3   hyoscyamine (LEVSIN, ANASPAZ) 0.125 MG tablet Take 0.125 mg by mouth as needed.      loperamide (IMODIUM A-D) 2 MG tablet Take 2 mg by mouth as needed.     naloxone (NARCAN) nasal spray 4 mg/0.1 mL For respiratory depression from opioids 1 kit 2   prochlorperazine (COMPAZINE) 5 MG tablet Take 5 mg by mouth every 8 (eight) hours as needed.      STELARA 90 MG/ML SOSY injection Inject 90 mg as directed.     oxyCODONE-acetaminophen (PERCOCET) 10-325 MG tablet Take 1 tablet by mouth every 8 (eight) hours as needed for pain. 90 tablet 0   amlodipine-atorvastatin (CADUET) 10-10 MG tablet Take 1 tablet by mouth daily. (Patient not taking: Reported on 04/22/2023)     citalopram (CELEXA) 10 MG tablet Take 10 mg by mouth daily as needed.     gabapentin (NEURONTIN) 100 MG capsule Take 1 capsule (100 mg total) by mouth at bedtime. (Patient not taking: Reported on 04/22/2023) 30 capsule 3   predniSONE (STERAPRED UNI-PAK 21 TAB) 10 MG (21) TBPK tablet 6 tablets day one and day two, five tablets day three and day four, and daily taper over 12days as directed (Patient not taking: Reported on 04/22/2023) 42 tablet 0   No facility-administered medications prior to visit.    Review of Systems CNS: No confusion or sedation Cardiac: No angina or palpitations GI: No abdominal pain or constipation Constitutional: No nausea vomiting fevers or chills  Objective:  BP 108/74 (Cuff Size: Large)   Pulse 74   Temp 97.9 F (36.6 C) (Temporal)   Resp 14   Ht 5\' 11"  (1.803 m)   Wt 165 lb (74.8 kg)   SpO2 99%   BMI 23.01 kg/m  BP Readings from Last 3 Encounters:  04/22/23 108/74  09/24/22 (!) 161/89  10/15/21 119/85     Wt Readings from Last 3 Encounters:  04/22/23 165 lb (74.8 kg)  09/24/22 159 lb (72.1 kg)  10/15/21 139 lb 15.9 oz (63.5 kg)     Physical Exam Pt is alert and oriented PERRL EOMI HEART IS RRR no murmur or rub LCTA no wheezing or rales MUSCULOSKELETAL reveals good grip strength bilaterally in the upper extremities with slightly diminished 5-/5 tricep extension strength on the right side as compared with the left muscle tone and bulk is at baseline.  He has some paraspinous muscle tenderness in the lumbar region but no overt trigger points.  He ambulates well.  Labs  No results found for: "HGBA1C" Lab Results  Component  Value Date   CREATININE 0.87 03/18/2018    -------------------------------------------------------------------------------------------------------------------- Lab Results  Component Value Date   WBC 10.1 03/18/2018   HGB 13.5 03/18/2018   HCT 39.6 03/18/2018   PLT 218 03/18/2018   GLUCOSE 65 (L) 03/18/2018   ALT 19 03/18/2018   AST 29 03/18/2018   NA 130 (L) 03/18/2018   K 4.1 03/18/2018   CL 95 (L) 03/18/2018   CREATININE 0.87 03/18/2018   BUN 16 03/18/2018   CO2 20 (L) 03/18/2018   TSH 0.667 03/18/2018    --------------------------------------------------------------------------------------------------------------------- No results found.   Assessment & Plan:   Benjamin Murillo was seen today for abdominal pain.  Diagnoses and all orders for this visit:  Cervicalgia -     ToxASSURE Select 13 (MW), Urine  Crohn's disease without complication, unspecified gastrointestinal tract location (HCC) -     ToxASSURE Select 13 (MW), Urine  Chronic pain syndrome -     ToxASSURE Select 13 (MW), Urine -     ToxASSURE Select 13 (MW), Urine  Chronic, continuous use of opioids -     ToxASSURE Select 13 (MW), Urine -     ToxASSURE Select 13 (MW), Urine  Chronic abdominal pain -     ToxASSURE Select 13 (MW), Urine  Facet arthritis of lumbar region -     ToxASSURE Select 13 (MW), Urine  Chronic neck and back pain -     ToxASSURE Select 13 (MW), Urine  Cervical radiculitis -     ToxASSURE Select 13 (MW), Urine  Acute pain of right shoulder -     ToxASSURE Select 13 (MW), Urine  Other orders -     oxyCODONE-acetaminophen (PERCOCET) 10-325 MG tablet; Take 1 tablet by mouth every 8 (eight) hours as needed for pain. -     oxyCODONE-acetaminophen (PERCOCET) 10-325 MG tablet; Take 1 tablet by mouth every 8 (eight) hours as needed for pain.        ----------------------------------------------------------------------------------------------------------------------  Problem List  Items Addressed This Visit       Unprioritized   Chronic pain syndrome (Chronic)   Relevant Medications   oxyCODONE-acetaminophen (PERCOCET) 10-325 MG tablet   oxyCODONE-acetaminophen (PERCOCET) 10-325 MG tablet (Start on 05/22/2023)   Other Relevant Orders   ToxASSURE Select 13 (MW), Urine   ToxASSURE Select 13 (MW), Urine   Cervical radiculitis   Relevant Orders   ToxASSURE Select 13 (MW), Urine   Chronic abdominal pain   Relevant Medications   oxyCODONE-acetaminophen (PERCOCET) 10-325 MG tablet   oxyCODONE-acetaminophen (PERCOCET) 10-325 MG tablet (Start on 05/22/2023)   Other Relevant Orders   ToxASSURE Select 13 (MW), Urine   Crohn disease (HCC)   Relevant Orders   ToxASSURE Select 13 (MW),  Urine   Shoulder pain, right   Relevant Orders   ToxASSURE Select 13 (MW), Urine   Other Visit Diagnoses       Cervicalgia    -  Primary   Relevant Orders   ToxASSURE Select 13 (MW), Urine     Chronic, continuous use of opioids       Relevant Orders   ToxASSURE Select 13 (MW), Urine   ToxASSURE Select 13 (MW), Urine     Facet arthritis of lumbar region       Relevant Medications   oxyCODONE-acetaminophen (PERCOCET) 10-325 MG tablet   oxyCODONE-acetaminophen (PERCOCET) 10-325 MG tablet (Start on 05/22/2023)   Other Relevant Orders   ToxASSURE Select 13 (MW), Urine     Chronic neck and back pain       Relevant Medications   oxyCODONE-acetaminophen (PERCOCET) 10-325 MG tablet   oxyCODONE-acetaminophen (PERCOCET) 10-325 MG tablet (Start on 05/22/2023)   Other Relevant Orders   ToxASSURE Select 13 (MW), Urine         ----------------------------------------------------------------------------------------------------------------------  1. Cervicalgia (Primary) Continue core stretching strengthening exercises - ToxASSURE Select 13 (MW), Urine  2. Crohn's disease without complication, unspecified gastrointestinal tract location [facility]) Continue follow-up with GI - ToxASSURE  Select 13 (MW), Urine  3. Chronic pain syndrome I have reviewed the [facility] practitioner database information is appropriate for refills dated for 325 and 424.  No other changes in his pharmacologic regimen will be initiated. - ToxASSURE Select 13 (MW), Urine - ToxASSURE Select 13 (MW), Urine  4. Chronic, continuous use of opioids As above - ToxASSURE Select 13 (MW), Urine - ToxASSURE Select 13 (MW), Urine  5. Chronic abdominal pain As above and continue follow-up with both GI and his primary care physician for baseline medical care. - ToxASSURE Select 13 (MW), Urine  6. Facet arthritis of lumbar region As above and continue core stretching strengthening exercises - ToxASSURE Select 13 (MW), Urine  7. Chronic neck and back pain  - ToxASSURE Select 13 (MW), Urine  8. Cervical radiculitis  - ToxASSURE Select 13 (MW), Urine  9. Acute pain of right shoulder  - ToxASSURE Select 13 (MW), Urine    ----------------------------------------------------------------------------------------------------------------------  I am having Vilinda Blanks. Suniga start on oxyCODONE-acetaminophen. I am also having him maintain his amlodipine-atorvastatin, dronabinol, naloxone, hyoscyamine, famotidine, Stelara, prochlorperazine, gabapentin, citalopram, loperamide, predniSONE, gabapentin, erythromycin, and oxyCODONE-acetaminophen.   Meds ordered this encounter  Medications   oxyCODONE-acetaminophen (PERCOCET) 10-325 MG tablet    Sig: Take 1 tablet by mouth every 8 (eight) hours as needed for pain.    Dispense:  90 tablet    Refill:  0   oxyCODONE-acetaminophen (PERCOCET) 10-325 MG tablet    Sig: Take 1 tablet by mouth every 8 (eight) hours as needed for pain.    Dispense:  90 tablet    Refill:  0   Patient's Medications  New Prescriptions   OXYCODONE-ACETAMINOPHEN (PERCOCET) 10-325 MG TABLET    Take 1 tablet by mouth every 8 (eight) hours as needed for pain.  Previous Medications    AMLODIPINE-ATORVASTATIN (CADUET) 10-10 MG TABLET    Take 1 tablet by mouth daily.   CITALOPRAM (CELEXA) 10 MG TABLET    Take 10 mg by mouth daily as needed.   DRONABINOL (MARINOL) 10 MG CAPSULE    Take 10 mg by mouth 2 (two) times daily before a meal.   ERYTHROMYCIN OPHTHALMIC OINTMENT    Place 1 Application into the left eye at bedtime.   FAMOTIDINE (PEPCID) 20  MG TABLET    Take 20 mg by mouth 2 (two) times daily.   GABAPENTIN (NEURONTIN) 100 MG CAPSULE    Take 1 capsule (100 mg total) by mouth at bedtime.   GABAPENTIN (NEURONTIN) 300 MG CAPSULE    Take 1 capsule (300 mg total) by mouth 3 (three) times daily.   HYOSCYAMINE (LEVSIN, ANASPAZ) 0.125 MG TABLET    Take 0.125 mg by mouth as needed.    LOPERAMIDE (IMODIUM A-D) 2 MG TABLET    Take 2 mg by mouth as needed.   NALOXONE (NARCAN) NASAL SPRAY 4 MG/0.1 ML    For respiratory depression from opioids   PREDNISONE (STERAPRED UNI-PAK 21 TAB) 10 MG (21) TBPK TABLET    6 tablets day one and day two, five tablets day three and day four, and daily taper over 12days as directed   PROCHLORPERAZINE (COMPAZINE) 5 MG TABLET    Take 5 mg by mouth every 8 (eight) hours as needed.   STELARA 90 MG/ML SOSY INJECTION    Inject 90 mg as directed.  Modified Medications   Modified Medication Previous Medication   OXYCODONE-ACETAMINOPHEN (PERCOCET) 10-325 MG TABLET oxyCODONE-acetaminophen (PERCOCET) 10-325 MG tablet      Take 1 tablet by mouth every 8 (eight) hours as needed for pain.    Take 1 tablet by mouth every 8 (eight) hours as needed for pain.  Discontinued Medications   No medications on file   ----------------------------------------------------------------------------------------------------------------------  Follow-up: Return in about 2 months (around 06/22/2023) for evaluation, med refill.    Yevette Edwards, MD

## 2023-04-22 NOTE — Patient Instructions (Signed)

## 2023-04-23 MED ORDER — GABAPENTIN 300 MG CAPSULE
ORAL_CAPSULE | Freq: Three times a day (TID) | ORAL | 0 refills | 0 days
Start: 2023-04-23 — End: ?

## 2023-04-24 MED ORDER — GABAPENTIN 300 MG CAPSULE
ORAL_CAPSULE | Freq: Three times a day (TID) | ORAL | 11 refills | 30 days | Status: CP
Start: 2023-04-24 — End: ?

## 2023-04-24 MED ORDER — OLANZAPINE 2.5 MG TABLET
ORAL_TABLET | Freq: Every evening | ORAL | 0 refills | 0 days
Start: 2023-04-24 — End: ?

## 2023-04-24 NOTE — Unmapped (Signed)
 Patient has been seen within the past year. Refill for Gabapentin sent to pharmacy per protocol.

## 2023-04-25 LAB — TOXASSURE SELECT 13 (MW), URINE

## 2023-05-05 MED ORDER — OLANZAPINE 2.5 MG TABLET
ORAL_TABLET | Freq: Every evening | ORAL | 0 refills | 30.00 days | Status: CP
Start: 2023-05-05 — End: ?

## 2023-05-14 ENCOUNTER — Ambulatory Visit: Admit: 2023-05-14 | Discharge: 2023-05-17 | Disposition: A | Discharge status: Home / Self Care | Payer: MEDICARE

## 2023-05-14 ENCOUNTER — Inpatient Hospital Stay: Admit: 2023-05-14 | Discharge: 2023-05-17 | Disposition: A | Discharge status: Home / Self Care | Payer: MEDICARE

## 2023-05-14 ENCOUNTER — Encounter: Admit: 2023-05-14 | Discharge: 2023-05-17 | Disposition: A | Discharge status: Home / Self Care | Payer: MEDICARE

## 2023-05-14 LAB — HEPATIC FUNCTION PANEL
ALBUMIN: 4.6 g/dL (ref 3.4–5.0)
ALKALINE PHOSPHATASE: 105 U/L (ref 46–116)
ALT (SGPT): 26 U/L (ref 10–49)
AST (SGOT): 37 U/L — ABNORMAL HIGH (ref <=34)
BILIRUBIN DIRECT: 0.2 mg/dL (ref 0.00–0.30)
BILIRUBIN TOTAL: 0.5 mg/dL (ref 0.3–1.2)
PROTEIN TOTAL: 8.4 g/dL — ABNORMAL HIGH (ref 5.7–8.2)

## 2023-05-14 LAB — URINALYSIS WITH MICROSCOPY WITH CULTURE REFLEX PERFORMABLE
BACTERIA: NONE SEEN /HPF
BILIRUBIN UA: NEGATIVE
GLUCOSE UA: NEGATIVE
HYALINE CASTS: 4 /LPF — ABNORMAL HIGH (ref 0–1)
KETONES UA: >150 — AB
LEUKOCYTE ESTERASE UA: NEGATIVE
NITRITE UA: NEGATIVE
PH UA: 6 (ref 5.0–9.0)
PROTEIN UA: 100 — AB
RBC UA: 24 /HPF — ABNORMAL HIGH (ref <=3)
SPECIFIC GRAVITY UA: 1.048 — ABNORMAL HIGH (ref 1.003–1.030)
SQUAMOUS EPITHELIAL: <1 /HPF (ref 0–5)
UROBILINOGEN UA: 6 — AB
WBC UA: 1 /HPF (ref <=2)

## 2023-05-14 LAB — CBC W/ AUTO DIFF
BASOPHILS ABSOLUTE COUNT: 0 10*9/L (ref 0.0–0.1)
BASOPHILS RELATIVE PERCENT: 0.4 %
EOSINOPHILS ABSOLUTE COUNT: 0 10*9/L (ref 0.0–0.5)
EOSINOPHILS RELATIVE PERCENT: 0.2 %
HEMATOCRIT: 37.6 % — ABNORMAL LOW (ref 39.0–48.0)
HEMOGLOBIN: 12.8 g/dL — ABNORMAL LOW (ref 12.9–16.5)
LYMPHOCYTES ABSOLUTE COUNT: 2.1 10*9/L (ref 1.1–3.6)
LYMPHOCYTES RELATIVE PERCENT: 20.8 %
MEAN CORPUSCULAR HEMOGLOBIN CONC: 33.9 g/dL (ref 32.0–36.0)
MEAN CORPUSCULAR HEMOGLOBIN: 31.8 pg (ref 25.9–32.4)
MEAN CORPUSCULAR VOLUME: 93.8 fL (ref 77.6–95.7)
MEAN PLATELET VOLUME: 7.2 fL (ref 6.8–10.7)
MONOCYTES ABSOLUTE COUNT: 0.8 10*9/L (ref 0.3–0.8)
MONOCYTES RELATIVE PERCENT: 7.9 %
NEUTROPHILS ABSOLUTE COUNT: 7.3 10*9/L (ref 1.8–7.8)
NEUTROPHILS RELATIVE PERCENT: 70.7 %
PLATELET COUNT: 384 10*9/L (ref 150–450)
RED BLOOD CELL COUNT: 4.01 10*12/L — ABNORMAL LOW (ref 4.26–5.60)
RED CELL DISTRIBUTION WIDTH: 13.7 % (ref 12.2–15.2)
WBC ADJUSTED: 10.3 10*9/L (ref 3.6–11.2)

## 2023-05-14 LAB — BASIC METABOLIC PANEL
ANION GAP: 14 mmol/L (ref 5–14)
BLOOD UREA NITROGEN: 9 mg/dL (ref 9–23)
BUN / CREAT RATIO: 12
CALCIUM: 10.1 mg/dL (ref 8.7–10.4)
CHLORIDE: 98 mmol/L (ref 98–107)
CO2: 19 mmol/L — ABNORMAL LOW (ref 20.0–31.0)
CREATININE: 0.77 mg/dL (ref 0.73–1.18)
EGFR CKD-EPI (2021) MALE: >90 mL/min/1.73m2 (ref >=60)
GLUCOSE RANDOM: 88 mg/dL (ref 70–179)
POTASSIUM: 3.9 mmol/L (ref 3.4–4.8)
SODIUM: 131 mmol/L — ABNORMAL LOW (ref 135–145)

## 2023-05-14 LAB — C-REACTIVE PROTEIN: C-REACTIVE PROTEIN: <5 mg/L (ref <=10.0)

## 2023-05-14 LAB — SEDIMENTATION RATE: ERYTHROCYTE SEDIMENTATION RATE: 38 mm/h — ABNORMAL HIGH (ref 0–20)

## 2023-05-14 LAB — LACTATE DEHYDROGENASE: LACTATE DEHYDROGENASE: 155 U/L (ref 120–246)

## 2023-05-14 LAB — LIPASE: LIPASE: 34 U/L (ref 12–53)

## 2023-05-14 MED ADMIN — morphine 4 mg/mL injection 4 mg: 4 mg | INTRAVENOUS | @ 22:00:00 | Stop: 2023-05-14

## 2023-05-14 MED ADMIN — iohexol (OMNIPAQUE) 350 mg iodine/mL solution 100 mL: 100 mL | INTRAVENOUS | @ 22:00:00 | Stop: 2023-05-14

## 2023-05-14 MED ADMIN — ondansetron (ZOFRAN) injection 4 mg: 4 mg | INTRAVENOUS | @ 22:00:00 | Stop: 2023-05-14

## 2023-05-14 NOTE — Unmapped (Signed)
 Here w/ abdominal pain, N/V/D. +bloody stools. Decreased PO intake. Hx crohn's.

## 2023-05-14 NOTE — Unmapped (Signed)
 Hx of Crohns disease- reports he has had increased abdominal pain, nausea/vomiting and intermittent BRB in stool x1 month. Also reporting increased fatigue. Has been taking Stelara  as prescribed. Denies fevers. Pt states he just feels bad. Also is concerned he has been exposed to black mold- reports headaches/dizziness over the past month as well.

## 2023-05-14 NOTE — Unmapped (Addendum)
 [facility]  Emergency Department Provider Note      ED Clinical Impression       Diagnosis ICD-10-CM Associated Orders   1. Abdominal pain, unspecified abdominal location  R10.9       2. 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 male with PMH of Crohn's on Stelara , GERD, Anxiety and HTN who presents to the ED with 1 month of nausea, vomiting, abdominal pain and hematochezia. On exam he is hemodynamically stable with diffuse abdominal tenderness without distention, rebound tenderness or guarding. Differential includes Crohn's flare, diverticulitis, ischemic colitis, gastroenteritis, appendicitis, pancreatitis and PUD.     Given broad differential, will obtain CT abdomen Pelvis with IV contrast to further characterize.   - Will obtain CBC, BMP, hepatic function panel, Lipase, LDH, ESR, CRP and UA    1944: CBC revealing mild normocytic anemia that is consistent with patient's baseline, no leukocytosis present. Lipase, LDH, CRP within normal limits. UA without any evidence of infection.    Discussed with GI as below, will plan to page out for admission for further management.  Discussed plan of care with patient and family at bedside, they verbalized understanding and agreement with the plan of care.    9:00 PM  Patient case signed out at shift change to attending, Dr. Burdette Carolin, pending admission.  ED Course as of 05/14/23 2101   Wed May 14, 2023   2012 CT abdomen pelvis with possible early rectosigmoid colitis, no other acute abnormalities   2029 I discussed with GI, plan for n.p.o. at midnight, likely plan for flex sig in the morning, EGD possibly considered as well.  Would recommend addition of a GI pathogen panel as well as C. difficile       Additional MDM Elements        Portions of this record have been created using Dragon dictation software. Dictation errors have been sought, but may not have been identified and corrected.    See chart and nursing documentation for additional ED course details.    ____________________________________________         History        Reason for Visit  Abdominal Pain      HPI   Rodney Bender is a 53 y.o. male with PMH of Crohn's, GERD, Anxiety and HTN who presents to the ED with 1 month of nausea, vomiting, abdominal pain and bright red blood per rectum.  Patient states that these symptoms started 1 month ago and have been constant since that time. He endorses inability to tolerate PO intake and notes nausea, vomiting and worsening abdominal pain with eating and drinking.  He states that due to his symptoms he has had decreased appetite he states that the abdominal pain is diffuse and is not worse in any one particular area. He notes several episodes of bloody bowel movements, some of which with dark in appearance, but typically with bright red blood per rectum.  Notes that prior to the past month, he was experiencing 2 episodes of bloody bowel movements daily, now experiencing greater than 6.  He also reports weight loss, weakness, fatigue, headache, and chills. He states that his Crohn's has been relatively stable on Stelara , with his most recent flare being over a  year ago.  Denies chest pain, shortness of breath, dysuria, hematuria, any other symptoms or concerns at this time.      Past Medical History:   Diagnosis Date    Acid reflux     Anxiety     Crohn's disease     diagnosed in 1990    GERD (gastroesophageal reflux disease)     Hypertension 10/08/2016       Past Surgical History:   Procedure Laterality Date    COLON SURGERY      PR COLONOSCOPY FLX DX W/COLLJ SPEC WHEN PFRMD Left 11/10/2012    Procedure: COLONOSCOPY, FLEXIBLE, PROXIMAL TO SPLENIC FLEXURE; DIAGNOSTIC, W/WO COLLECTION SPECIMEN BY BRUSH OR WASH;  Surgeon: Aloha Jakes, MD;  [facility]: [facility];  Service: Gastroenterology    PR COLONOSCOPY FLX DX W/COLLJ SPEC WHEN PFRMD N/A 07/21/2013    Procedure: COLONOSCOPY, FLEXIBLE, PROXIMAL TO SPLENIC FLEXURE; DIAGNOSTIC, W/WO COLLECTION SPECIMEN BY BRUSH OR WASH;  Surgeon: Broadus Canes, MD;  [facility]: [facility];  Service: Gastroenterology    PR COLONOSCOPY FLX DX W/COLLJ SPEC WHEN PFRMD N/A 08/09/2016    Procedure: COLONOSCOPY, FLEXIBLE, PROXIMAL TO SPLENIC FLEXURE; DIAGNOSTIC, W/WO COLLECTION SPECIMEN BY BRUSH OR WASH;  Surgeon: Gypsy Lesser, MD;  Location: GI [facility]URES [facility];  Service: Gastroenterology    PR COLONOSCOPY FLX DX W/COLLJ SPEC WHEN PFRMD N/A 04/06/2018    Procedure: COLONOSCOPY, FLEXIBLE, PROXIMAL TO SPLENIC FLEXURE; DIAGNOSTIC, W/WO COLLECTION SPECIMEN BY BRUSH OR WASH;  Surgeon: Rana Burr, MD;  [facility];  Service: Gastroenterology    PR COLONOSCOPY FLX DX W/COLLJ SPEC WHEN PFRMD N/A 10/18/2021    Procedure: COLONOSCOPY, FLEXIBLE, PROXIMAL TO SPLENIC FLEXURE; DIAGNOSTIC, W/WO COLLECTION SPECIMEN BY BRUSH OR WASH;  Surgeon: Natha Bair, MD;  Location: [facility]URES [facility];  Service: Gastroenterology    PR COLONOSCOPY W/BIOPSY SINGLE/MULTIPLE  07/23/2012    Procedure: COLONOSCOPY, FLEXIBLE, PROXIMAL TO SPLENIC FLEXURE; WITH BIOPSY, SINGLE OR MULTIPLE;  Surgeon: Allene Ards, MD;  Location: [facility];  Service: Gastroenterology    PR COLONOSCOPY W/BIOPSY SINGLE/MULTIPLE N/A 07/15/2014    Procedure: COLONOSCOPY, FLEXIBLE, PROXIMAL TO SPLENIC FLEXURE; WITH BIOPSY, SINGLE OR MULTIPLE;  Surgeon: Gypsy Lesser, MD;  Location: [facility];  Service: Gastroenterology    PR COLONOSCOPY W/BIOPSY SINGLE/MULTIPLE N/A 03/21/2017    Procedure: COLONOSCOPY, FLEXIBLE, PROXIMAL TO SPLENIC FLEXURE; WITH BIOPSY, SINGLE OR MULTIPLE;  Surgeon: Patria Bookbinder, MD;  [facility];  Service: Gastroenterology    PR COLONOSCOPY W/BIOPSY SINGLE/MULTIPLE N/A 02/12/2019    Procedure: COLONOSCOPY, FLEXIBLE, PROXIMAL TO SPLENIC FLEXURE; WITH BIOPSY, SINGLE OR MULTIPLE;  Surgeon: Percy Bracken, MD;  Location: [facility]URES [facility];  Service: Gastroenterology    PR COLONOSCOPY W/BIOPSY SINGLE/MULTIPLE Left 05/15/2020    Procedure: COLONOSCOPY, FLEXIBLE, PROXIMAL TO SPLENIC FLEXURE; WITH BIOPSY, SINGLE OR MULTIPLE;  Surgeon: Marsh Skeans, MD;  Location[facility];  Service: Gastroenterology    PR COLSC FLEXIBLE W/TRANSENDOSCOPIC BALLOON DILAT N/A 07/15/2014    Procedure: COLONOSCOPY, FLEXIBLE; WITH DILATION BY BALLOON, 1 OR MORE STRICTURES;  Surgeon: Gypsy Lesser, MD;  Location: [facility];  Service: Gastroenterology    PR ESOPHGL BALO DISTENSION DX STD W/PROVOCATION N/A 11/18/2022    Procedure: ESOPHAGEAL BALLOON DISTENSION STUDY, DIAGNOSTIC WITH PROVOCATION;  Surgeon: Natha Bair, MD;  Location: [facility];  Service: Gastroenterology    PR Francisca [place] N/A 11/18/2022  Procedure: INJECTION ONABOTULINUMTOXINA 1 UNIT;  Surgeon: Natha Bair, MD;  Location: [facility];  Service: Gastroenterology    PR REMVL COLON & TERM ILEUM W/ILEOCOLOSTOMY N/A 09/16/2016    Procedure: COLECTOMY, PARTIAL, WITH REMOVAL OF TERMINAL ILEUM WITH ILEOCOLOSTOMY;  Surgeon: Renette Carton, MD;  Location: MAIN OR [facility];  Service: Gastrointestinal    PR REPAIR BICEPS LONG TENDON Right 03/19/2019    Procedure: TENODESIS LONG TENDON BICEPS;  Surgeon: Barrett Antigua, MD;  Location: ASC OR [facility];  Service: Orthopedics    PR SHLDR ARTHROSCOP,PART ACROMIOPLAS Right 10/02/2018    Procedure: R16 ARTHROSCOPY, SHOULDER, SURGICAL; DECOMPRESS SUBACROMIAL SPACE W/PART ACROMIOPLASTY, Cleavon Curls;  Surgeon: Kosa Antigua, MD;  Location: ASC OR [facility];  Service: Orthopedics    PR SHLDR ARTHROSCOP,SURG,W/ROTAT CUFF REPR Right 10/02/2018    Procedure: ARTHROSCOPY, SHOULDER, SURGICAL; WITH ROTATOR CUFF REPAIR;  Surgeon: Schuknecht Antigua, MD;  Location: ASC OR [facility];  Service: Orthopedics    PR SHLDR ARTHROSCOP,SURG,W/ROTAT CUFF REPR Right 03/19/2019    Procedure: R16 ARTHROSCOPY, SHOULDER, SURGICAL; WITH ROTATOR CUFF REPAIR;  Surgeon: Litt Antigua, MD;  Location: ASC OR [facility];  Service: Orthopedics    PR UP GI ENDOSCOPY,BALL DIL,30MM N/A 11/18/2022    Procedure: UGI ENDO; W/BALLOON DILAT ESOPHAGUS (<30MM DIAM);  Surgeon: Natha Bair, MD;  Location: [facility];  Service: Gastroenterology    PR UPPER GI ENDOSCOPY,BIOPSY N/A 07/23/2012    Procedure: UGI ENDOSCOPY; WITH BIOPSY, SINGLE OR MULTIPLE;  Surgeon: Allene Ards, MD;  Location: [facility];  Service: Gastroenterology    PR UPPER GI ENDOSCOPY,DIAGNOSIS N/A 11/10/2012    Procedure: UGI ENDO, INCLUDE ESOPHAGUS, STOMACH, & DUODENUM &/OR JEJUNUM; DX W/WO COLLECTION SPECIMN, BY BRUSH OR WASH;  Surgeon: Aloha Jakes, MD;  Location: [facility]URES [facility];  Service: Gastroenterology    PR UPPER GI ENDOSCOPY,DIAGNOSIS N/A 07/21/2013    Procedure: UGI ENDO, INCLUDE ESOPHAGUS, STOMACH, & DUODENUM &/OR JEJUNUM; DX W/WO COLLECTION SPECIMN, BY BRUSH OR WASH;  Surgeon: Broadus Canes, MD;  Location: [facility]URES [facility];  Service: Gastroenterology    PR UPPER GI ENDOSCOPY,DIAGNOSIS N/A 07/15/2014    Procedure: UGI ENDO, INCLUDE ESOPHAGUS, STOMACH, & DUODENUM &/OR JEJUNUM; DX W/WO COLLECTION SPECIMN, BY BRUSH OR WASH;  Surgeon: Gypsy Lesser, MD;  Location: [facility]URES [facility];  Service: Gastroenterology    PR UPPER GI ENDOSCOPY,DIAGNOSIS N/A 10/18/2021    Procedure: UGI ENDO, INCLUDE ESOPHAGUS, STOMACH, & DUODENUM &/OR JEJUNUM; DX W/WO COLLECTION SPECIMN, BY BRUSH OR WASH;  Surgeon: Natha Bair, MD;  Location: [facility];  Service: Gastroenterology    ROTATOR CUFF REPAIR Right 2020    SHOULDER SURGERY      SPINE SURGERY           Current Facility-Administered Medications:     morphine  4 mg/mL injection 4 mg, 4 mg, Intravenous, Once, Henderson Lock, PA    Current Outpatient Medications:     busPIRone  (BUSPAR ) 10 MG tablet, Take 2 tablets (20 mg total) by mouth Three (3) times a day., Disp: 180 tablet, Rfl: 2    carisoprodol (SOMA) 350 MG tablet, , Disp: , Rfl:     citalopram  (CELEXA ) 40 MG tablet, Take 1 tablet (40 mg total) by mouth daily., Disp: 30 tablet, Rfl: 11    cyclobenzaprine (FLEXERIL) 5 MG tablet, , Disp: , Rfl:     dicyclomine  (BENTYL ) 20 mg tablet, , Disp: , Rfl:     dronabinol  (  MARINOL) 10 MG capsule, Take 1 capsule (10 mg total) by mouth Two (2) times a day (30 minutes before a meal)., Disp: 60 capsule, Rfl: 0    erythromycin (ROMYCIN) 5 mg/gram (0.5 %) ophthalmic ointment, Apply 1 application. (1 Application total) to eye., Disp: , Rfl:     ferrous sulfate 325 (65 FE) MG tablet, TAKE 1 TABLET BY MOUTH EVERY OTHER DAY, Disp: 60 tablet, Rfl: 0    gabapentin (NEURONTIN) 300 MG capsule, TAKE 2 CAPSULES BY MOUTH THREE TIMES DAILY, Disp: 180 capsule, Rfl: 11    HYDROcodone-acetaminophen (NORCO) 5-325 mg per tablet, take 1 to 2 tablets by mouth every 6 hours if needed, Disp: , Rfl:     hydrOXYzine (ATARAX) 25 MG tablet, Take 1 tablet (25 mg total) by mouth Three (3) times a day as needed for anxiety., Disp: 90 tablet, Rfl: 3    loperamide (IMODIUM A-D) 2 mg tablet, Take 1 tablet (2 mg total) by mouth daily as needed., Disp: , Rfl:     meloxicam (MOBIC) 7.5 MG tablet, Take 1 tablet twice a day by oral route with meals., Disp: , Rfl:     naloxone (NARCAN) 4 mg nasal spray, , Disp: , Rfl:     OLANZapine (ZYPREXA) 2.5 MG tablet, Take 1 tablet by mouth nightly, Disp: 30 tablet, Rfl: 0    ondansetron (ZOFRAN-ODT) 4 MG disintegrating tablet, Dissolve 2 tablets (8 mg total) in the mouth every eight (8) hours as needed for nausea., Disp: 45 tablet, Rfl: 11    [Paused] oxyCODONE-acetaminophen (PERCOCET) 10-325 mg per tablet, Take 1 tablet by mouth every eight (8) hours as needed., Disp: , Rfl:     pantoprazole (PROTONIX) 40 MG tablet, Take 1 tablet (40 mg total) by mouth Two (2) times a day (30 minutes before a meal)., Disp: 180 tablet, Rfl: 3    promethazine (PHENERGAN) 25 MG tablet, Take 2 tablets (50 mg total) by mouth every six (6) hours as needed for nausea., Disp: 45 tablet, Rfl: 11    sertraline (ZOLOFT) 50 MG tablet, , Disp: , Rfl:     testosterone 20.25 mg/1.25 gram (1.62 %) gel pump, Place 2 sprays (40.5 mg total) on the skin daily., Disp: 75 g, Rfl: 5    therapeutic multivitamin (THERAGRAN) tablet, Take 1 tablet by mouth daily., Disp: 90 tablet, Rfl: 3    ustekinumab (STELARA) 90 mg/mL Syrg syringe, Inject the contents of 1 syringe (90 mg total) under the skin every 8 weeks., Disp: 1 mL, Rfl: 5    Allergies  Tramadol, Penicillins, Dilaudid [hydromorphone], and Morphine    Family History   Problem Relation Age of Onset    Hyperlipidemia Father     Cancer Father         kidney    Cancer Maternal Aunt     Stroke Mother     No Known Problems Sister     No Known Problems Brother     No Known Problems Maternal Uncle     No Known Problems Paternal Aunt     No Known Problems Paternal Uncle     No Known Problems Maternal Grandmother     No Known Problems Maternal Grandfather     No Known Problems Paternal Grandmother     No Known Problems Paternal Grandfather     Anesthesia problems Neg Hx     Broken bones Neg Hx     Clotting disorder Neg Hx     Collagen disease Neg Hx  Diabetes Neg Hx     Dislocations Neg Hx     Fibromyalgia Neg Hx     Gout Neg Hx     Hemophilia Neg Hx     Osteoporosis Neg Hx     Rheumatologic disease Neg Hx     Scoliosis Neg Hx     Severe sprains Neg Hx     Sickle cell anemia Neg Hx     Spinal Compression Fracture Neg Hx        Social History     Tobacco Use    Smoking status: Former     Current packs/day: 0.00     Average packs/day: 1 pack/day for 18.0 years (18.0 ttl pk-yrs)     Types: Cigarettes     Start date: 08/27/2003 Quit date: 06/06/2017     Years since quitting: 5.9    Smokeless tobacco: Never    Tobacco comments:     Pt smokes 1ppd, Pt is interested in tobacco cessation    Vaping Use    Vaping status: Never Used   Substance Use Topics    Alcohol use: Not Currently    Drug use: Not Currently     Types: Marijuana          Physical Exam     ED Triage Vitals   Enc Vitals Group      BP 05/14/23 1437 (!) 140/104      Heart Rate 05/14/23 1429 125      SpO2 Pulse 05/14/23 1437 88      Respiration Rate 05/14/23 1437 18      Temp 05/14/23 1437 36.9 ??C (98.4 ??F)      Temp Source 05/14/23 1437 Oral      SpO2 05/14/23 1429 98 %      Weight 05/14/23 1433 63.7 kg (140 lb 6.9 oz)      Height --       Head Circumference --       Peak Flow --       Pain Score --       Pain Loc --       Pain Education --       Exclude from Growth Chart --        Constitutional: Alert and oriented.   Eyes: Conjunctivae are normal.  ENT       Head: Normocephalic and atraumatic.       Nose: No congestion.       Mouth/Throat: Mucous membranes are moist.       Neck: No stridor.  Cardiovascular: Normal rate, regular rhythm.  Respiratory: Normal respiratory effort. Breath sounds are normal.  Gastrointestinal: Soft and non distended, diffusely tender to palpation in all 4 quadrants. Normoactive BS in all 4 quadrants.   Musculoskeletal: Normal range of motion in all extremities.  Neurologic: Normal speech and language. No gross focal neurologic deficits are appreciated.  Skin: Skin is warm, dry and intact. No rash noted.  Psychiatric: Mood and affect are normal. Speech and behavior are normal.       Radiology     CT Abdomen Pelvis W IV Contrast Only   Final Result   Possible early rectosigmoid colitis given mild mural enhancement and questionable engorgement of the vasa recta. Otherwise, no acute abnormality in the abdomen or pelvis.               XR Chest 2 views   Final Result      No acute cardiopulmonary disease.  Procedures including Critical Care     Welford Haley, PA-S2    I supervised care provided by the PA student.  We have discussed the case, I have reviewed the note and I agree with the plan of treatment.    I personally interviewed the patient and examined the patient.       Arie Kurtz [place], [place]  05/14/23 2101       Arie Kurtz [place], [place]  05/14/23 2207

## 2023-05-14 NOTE — Unmapped (Signed)
 [facility]  Telephone Note         Recommendations:   Rodney Bender is a 53 y.o. male that presented to [name] with 1 month of bloody stools, abdominal pain, nausea, vomiting. I was called by the ED for recommendations regarding this patients  BRBPR, nausea, abdominal pain, history of CD .    H/o Crohn's disease s/p ileocecectomy s/p multiple revisions  BRBPR  Abdominal pain  Nausea  Vomiting  30M h/o CD s/p ileocecectomy and multiple revisions, functional dyspepsia, presenting with 1 month of feeling poorly, abdominal pain, nausea, vomiting, and bloody bowel movements. Per ED patient states these symptoms have been ongoing for around 1 month. Normally was having 1-2 bloody BM per day, now having around 6. Patient is prescribed q8wk stelara , endorses taking it. Last GI visit 08/2022 with Dr Joycelyn Noa. Last EGD 10/2022 w/ schatzki ring s/p dilation, botulinum injection to GEJ. Cscope 09/2021 with mild inflammation. This admission, afebrile, HDS. Labs notable for albumin 4.6, ESR 38, CRP <5, WBC 10.3, hgb 12.8. CTAP w/ possible early rectosigmoid colitis. Overall presentation could be multi-factorial in nature. Patient with history of functional dyspepsia which could cause UGI symptoms. However, does have CD with multiple surgeries and on biologic therapy and symptoms could be from CD flare (reassured that he is afebrile, normotensive, and low CRP, with normal hgb). Query patient's adherence with stelara . Additional etiologies include infectious colitis (6 bloody BM/day, colitis on CTAP), will rule-out with lab studies. Seemingly mild clinical presentation given degree of imaging and lab findings. Will consider endoscopic evaluation with flex sig (rectosigmoid colitis) and possible EGD for UGI symptoms. May need steroids pending infectious rule-out.     - okay for clear liquid diet now  - NPO at midnight  - plan for possible flexible sigmoidoscopy 4/17 +/- EGD   []  will likely need 2 tap water enema before procedure   - please obtain cdiff and GIPP  - trend daily CRP, CBC, CMP  - OK for DVT prophylaxis  - please monitor and chart bowel movements     The patient has been placed on the [facility] list and a full consultation note will be left in the morning.    I have neither seen nor examined this patient, I have only discussed the patient with the requesting provider and briefly reviewed the patient's chart. I have provided the requesting provider with the above recommendations based on this limited evaluation. Ultimately, the final decision making regarding the patient's care lies with the requesting provider.

## 2023-05-14 NOTE — Unmapped (Signed)
 Internal Medicine (MEDU) History & Physical    Assessment & Plan:   Rodney Bender is a 53 y.o. male whose presentation is complicated by  Crohn's on Stelara , GERD, Anxiety and HTN  that presented to [facility] with 1 month of nausea, vomiting, abdominal pain, and diarrhea with hematochezia.     Principal Problem:    Crohn's disease of both small and large intestine with other complication  Active Problems:    Chronic nausea    Chronic abdominal pain        Active Problems    Suspected Crohn's Flare  Rodney Bender presents with a month of abdominal pain, nausea, vomiting, and diarrhea with hematochezia on a background of Crohn's controlled with Stelara . He also reports increasing fatigue and poor PO intake due to the nausea and vomiting. These symptoms are very similar to those he has had during other Crohn's flares. Baseline 1-2 bloody BM now up to 6 daily.  History includes ileocecectomy and multiple revisions, does have a history of noncomplainace but has been compliant over last few years. In the ED he received a CT abdomen and pelvis that showed possible early rectosigmoid colitis. Labs notable for ESR 38, CRP <5, WBC 10.3, hgb 12.8 (around baseline) GI was consulted and plan to do a flex sig in the morning.   - Follow up GIPP and C diff assay  - GI following, appreciate recs  - NPO at midnight for flex sig  - Daily CBC, BMP, and CRP  - Oxycodone  q4h PRN for pain as that is what he's on at home  - Continue home dicyclomine  20mg  TID  - Continue home dronabinol  10mg  BID  - Continue home gabapentin  600mg  TID  - Continue home multivitamin and ferrous sulfate  tablet (325mg  every other day)    Chronic Problems  Cervicalgia and chronic abdominal pain: Follows with pain management at [facility] (Dr. Marica Shoals) per notes takes percocet  10-325mg   q8h as needed for pain, states he takes it pretty consistently. Will continue that inpatient.   Functional Dyspepsia: continue Zyprexa , pantropazole, and bentyl   Depression/Anxiety: Continue home buspirone  (20mg  TID), citalopram  (40mg  daily), olanzapine  (2.5mg  nightly)  GERD: continue home Protonix  (40mg  BID)      The patient's presentation is complicated by the following clinically significant conditions requiring additional evaluation and treatment: - Dehydration POA requiring further investigation, treatment, or monitoring         Issues Impacting Complexity of Management:  -The patient is at high risk of complications from Crohn's flare      Medical Decision Making: Reviewed records from the following unique sources  ED note and chart.      Checklist:  Diet: NPO  DVT PPx: Lovenox 40mg  q24h  Code Status: DNR and DNI - willing to reverse for procedures only  Dispo: Patient appropriate for Inpatient based on expectation of ongoing need for hospitalization greater than two midnights based on severity of presentation/services including inpatient evaluation of chron's flare    Team Contact Information:   Primary Team: Internal Medicine (MEDU)  Primary Resident: Rubye Corpus  Resident's Pager: [phone] (Gen MedU Senior Resident)    Chief Concern:   Crohn's disease of both small and large intestine with other complication      Subjective:   Rodney Bender is a 53 y.o. male with pertinent PMHx of Crohn's on Stelara , GERD, anxiety, and HTN presenting with nausea, vomiting, abdominal pain, and hematochezia.     History obtained by speaking with patient.  HPI:  Rodney Bender presents today with abdominal pain, nausea, vomiting, and diarrhea with hematochezia. These symptoms have been present for about the past month and are very similar to the symptoms he has had will past Crohn's flares. He says his last flare was about a year ago and that his symptoms have been fairly well controled on Stelara . He says that at baseline his stools are always loose but that he tends to only have 1-2 bowel movements a day. Currently he is having 5-6 bloody bowel movements a day. He has also noticed increasing fatigue over this time and feels like he has no energy. He says he is vomiting multiple times a day and that the nausea and vomiting has also made it very hard for him to keep food down and his PO intake has decreased. He also note diffuse abdominal pain that is not localized to any specific area of his abdomin. He reports some recent weight loss, headache, and chills, but denies chest pain or shortness of breath.     Pertinent Surgical Hx  Past Surgical History:   Procedure Laterality Date    COLON SURGERY      PR COLONOSCOPY FLX DX W/COLLJ SPEC WHEN PFRMD Left 11/10/2012    Procedure: COLONOSCOPY, FLEXIBLE, PROXIMAL TO SPLENIC FLEXURE; DIAGNOSTIC, W/WO COLLECTION SPECIMEN BY BRUSH OR WASH;  Surgeon: Aloha Jakes, MD;  Location: GI PROCEDURES [facility];  Service: Gastroenterology    PR COLONOSCOPY FLX DX W/COLLJ SPEC WHEN PFRMD N/A 07/21/2013    Procedure: COLONOSCOPY, FLEXIBLE, PROXIMAL TO SPLENIC FLEXURE; DIAGNOSTIC, W/WO COLLECTION SPECIMEN BY BRUSH OR WASH;  Surgeon: Broadus Canes, MD;  Location: GI PROCEDURES [facility];  Service: Gastroenterology    PR COLONOSCOPY FLX DX W/COLLJ SPEC WHEN PFRMD N/A 08/09/2016    Procedure: COLONOSCOPY, FLEXIBLE, PROXIMAL TO SPLENIC FLEXURE; DIAGNOSTIC, W/WO COLLECTION SPECIMEN BY BRUSH OR WASH;  Surgeon: Gypsy Lesser, MD;  Location: GI [facility]URES [facility];  Service: Gastroenterology    PR COLONOSCOPY FLX DX W/COLLJ SPEC WHEN PFRMD N/A 04/06/2018    Procedure: COLONOSCOPY, FLEXIBLE, PROXIMAL TO SPLENIC FLEXURE; DIAGNOSTIC, W/WO COLLECTION SPECIMEN BY BRUSH OR WASH;  Surgeon: Rana Burr, MD;  Location: [facility];  Service: Gastroenterology    PR COLONOSCOPY FLX DX W/COLLJ SPEC WHEN PFRMD N/A 10/18/2021    Procedure: COLONOSCOPY, FLEXIBLE, PROXIMAL TO SPLENIC FLEXURE; DIAGNOSTIC, W/WO COLLECTION SPECIMEN BY BRUSH OR WASH;  Surgeon: Natha Bair, MD;  Location: GI PROC[facility]URES [facility];  Service: Gastroenterology    PR COLONOSCOPY W/BIOPSY SINGLE/MULTIPLE  07/23/2012    Procedure: COLONOSCOPY, FLEXIBLE, PROXIMAL TO SPLENIC FLEXURE; WITH BIOPSY, SINGLE OR MULTIPLE;  Surgeon: Allene Ards, MD;  Location: [facility]URES [facility];  Service: Gastroenterology    PR COLONOSCOPY W/BIOPSY SINGLE/MULTIPLE N/A 07/15/2014    Procedure: COLONOSCOPY, FLEXIBLE, PROXIMAL TO SPLENIC FLEXURE; WITH BIOPSY, SINGLE OR MULTIPLE;  Surgeon: Gypsy Lesser, MD;  Location: [facility]URES [facility];  Service: Gastroenterology    PR COLONOSCOPY W/BIOPSY SINGLE/MULTIPLE N/A 03/21/2017    Procedure: COLONOSCOPY, FLEXIBLE, PROXIMAL TO SPLENIC FLEXURE; WITH BIOPSY, SINGLE OR MULTIPLE;  Surgeon: Patria Bookbinder, MD;  [facility]: [facility];  Service: Gastroenterology    PR COLONOSCOPY W/BIOPSY SINGLE/MULTIPLE N/A 02/12/2019    Procedure: COLONOSCOPY, FLEXIBLE, PROXIMAL TO SPLENIC FLEXURE; WITH BIOPSY, SINGLE OR MULTIPLE;  Surgeon: Percy Bracken, MD;  Location: GI [facility]URES [facility];  Service: Gastroenterology    PR COLONOSCOPY W/BIOPSY SINGLE/MULTIPLE Left 05/15/2020    Procedure: COLONOSCOPY, FLEXIBLE, PROXIMAL TO  SPLENIC FLEXURE; WITH BIOPSY, SINGLE OR MULTIPLE;  Surgeon: Marsh Skeans, MD;  [facility];  Service: Gastroenterology    PR COLSC FLEXIBLE W/TRANSENDOSCOPIC BALLOON DILAT N/A 07/15/2014    Procedure: COLONOSCOPY, FLEXIBLE; WITH DILATION BY BALLOON, 1 OR MORE STRICTURES;  Surgeon: Gypsy Lesser, MD;  Location: GI [facility]URES [facility];  Service: Gastroenterology    PR ESOPHGL BALO DISTENSION DX STD W/PROVOCATION N/A 11/18/2022    Procedure: ESOPHAGEAL BALLOON DISTENSION STUDY, DIAGNOSTIC WITH PROVOCATION;  Surgeon: Natha Bair, MD;  Location: GI PROC[facility]URES [facility];  Service: Gastroenterology    PR INJECTION,ONABOTULINUMTOXINA N/A 11/18/2022    Procedure: INJECTION ONABOTULINUMTOXINA 1 UNIT;  Surgeon: Natha Bair, MD; Location: [facility];  Service: Gastroenterology    PR REMVL COLON & TERM ILEUM W/ILEOCOLOSTOMY N/A 09/16/2016    Procedure: COLECTOMY, PARTIAL, WITH REMOVAL OF TERMINAL ILEUM WITH ILEOCOLOSTOMY;  Surgeon: Renette Carton, MD;  Location: MAIN OR [facility];  Service: Gastrointestinal    PR REPAIR BICEPS LONG TENDON Right 03/19/2019    Procedure: TENODESIS LONG TENDON BICEPS;  Surgeon: Shimabukuro Antigua, MD;  Location: ASC OR [facility];  Service: Orthopedics    PR SHLDR ARTHROSCOP,PART ACROMIOPLAS Right 10/02/2018    Procedure: R16 ARTHROSCOPY, SHOULDER, SURGICAL; DECOMPRESS SUBACROMIAL SPACE W/PART ACROMIOPLASTY, Cleavon Curls;  Surgeon: Group Antigua, MD;  Location: ASC OR [facility];  Service: Orthopedics    PR SHLDR ARTHROSCOP,SURG,W/ROTAT CUFF REPR Right 10/02/2018    Procedure: ARTHROSCOPY, SHOULDER, SURGICAL; WITH ROTATOR CUFF REPAIR;  Surgeon: Daluz Antigua, MD;  Location: ASC OR [facility];  Service: Orthopedics    PR SHLDR ARTHROSCOP,SURG,W/ROTAT CUFF REPR Right 03/19/2019    Procedure: R16 ARTHROSCOPY, SHOULDER, SURGICAL; WITH ROTATOR CUFF REPAIR;  Surgeon: Wierman Antigua, MD;  Location: ASC OR [facility];  Service: Orthopedics    PR UP GI ENDOSCOPY,BALL DIL,30MM N/A 11/18/2022    Procedure: UGI ENDO; W/BALLOON DILAT ESOPHAGUS (<30MM DIAM);  Surgeon: Natha Bair, MD;  Location: [facility]URES [facility];  Service: Gastroenterology    PR UPPER GI ENDOSCOPY,BIOPSY N/A 07/23/2012    Procedure: UGI ENDOSCOPY; WITH BIOPSY, SINGLE OR MULTIPLE;  Surgeon: Allene Ards, MD;  Location: GI PROCEDURES [facility];  Service: Gastroenterology    PR UPPER GI ENDOSCOPY,DIAGNOSIS N/A 11/10/2012    Procedure: UGI ENDO, INCLUDE ESOPHAGUS, STOMACH, & DUODENUM &/OR JEJUNUM; DX W/WO COLLECTION SPECIMN, BY BRUSH OR WASH;  Surgeon: Aloha Jakes, MD;  Location: GI [facility]URES [facility];  Service: Gastroenterology    PR UPPER GI ENDOSCOPY,DIAGNOSIS N/A 07/21/2013    Procedure: UGI ENDO, INCLUDE ESOPHAGUS, STOMACH, & DUODENUM &/OR JEJUNUM; DX W/WO COLLECTION SPECIMN, BY BRUSH OR WASH;  Surgeon: Broadus Canes, MD;  Location: GI [facility]URES [facility];  Service: Gastroenterology    PR UPPER GI ENDOSCOPY,DIAGNOSIS N/A 07/15/2014    Procedure: UGI ENDO, INCLUDE ESOPHAGUS, STOMACH, & DUODENUM &/OR JEJUNUM; DX W/WO COLLECTION SPECIMN, BY BRUSH OR WASH;  Surgeon: Gypsy Lesser, MD;  Location: GI PROCEDURES [facility];  Service: Gastroenterology    PR UPPER GI ENDOSCOPY,DIAGNOSIS N/A 10/18/2021    Procedure: UGI ENDO, INCLUDE ESOPHAGUS, STOMACH, & DUODENUM &/OR JEJUNUM; DX W/WO COLLECTION SPECIMN, BY BRUSH OR WASH;  Surgeon: Natha Bair, MD;  Location: GI PROCEDURES [facility];  Service: Gastroenterology    ROTATOR CUFF REPAIR Right 2020    SHOULDER SURGERY      SPINE SURGERY         Pertinent Family Hx  Denies known family history of Crohn's, cancer, or  heart disease    Pertinent Social Hx   He is a former smoker (18 pack years) that quit about 6 years ago. He currently doesn't drink alcohol. He has used marijuana in the past but not currently.    Allergies  Tramadol, Penicillins, Dilaudid [hydromorphone], and Morphine    I reviewed the Medication List. The current list is Accurate  Prior to Admission medications    Medication Dose, Route, Frequency   busPIRone (BUSPAR) 10 MG tablet 20 mg, Oral, 3 times a day (standard)   carisoprodol (SOMA) 350 MG tablet    cyclobenzaprine (FLEXERIL) 5 MG tablet    dicyclomine (BENTYL) 20 mg tablet    dronabinol (MARINOL) 10 MG capsule 10 mg, Oral, 2 times a day (AC)   ferrous sulfate 325 (65 FE) MG tablet 1 tablet, Oral   gabapentin (NEURONTIN) 300 MG capsule 600 mg, Oral, 3 times a day (standard)   HYDROcodone-acetaminophen (NORCO) 5-325 mg per tablet take 1 to 2 tablets by mouth every 6 hours if needed   hydrOXYzine (ATARAX) 25 MG tablet 25 mg, Oral, 3 times a day PRN   loperamide (IMODIUM A-D) 2 mg tablet 2 mg, Daily PRN   meloxicam (MOBIC) 7.5 MG tablet Take 1 tablet twice a day by oral route with meals.   naloxone (NARCAN) 4 mg nasal spray    OLANZapine (ZYPREXA) 2.5 MG tablet 2.5 mg, Oral, Nightly   ondansetron (ZOFRAN-ODT) 4 MG disintegrating tablet 8 mg, orally disintegrating tablet, Every 8 hours PRN   pantoprazole (PROTONIX) 40 MG tablet 40 mg, Oral, 2 times a day (AC)   promethazine (PHENERGAN) 25 MG tablet 50 mg, Oral, Every 6 hours PRN   testosterone 20.25 mg/1.25 gram (1.62 %) gel pump 40.5 mg, Transdermal, Daily (standard)   therapeutic multivitamin (THERAGRAN) tablet 1 tablet, Oral, Daily (standard)   ustekinumab (STELARA) 90 mg/mL Syrg syringe Inject the contents of 1 syringe (90 mg total) under the skin every 8 weeks.   citalopram (CELEXA) 40 MG tablet 40 mg, Oral, Daily (standard)   oxyCODONE-acetaminophen (PERCOCET) 10-325 mg per tablet 1 tablet, Oral, Every 8 hours PRN       Designated Healthcare Decision Maker:  Mr. Corning currently has decisional capacity for healthcare decision-making and is able to designate a surrogate healthcare decision maker. Mr. Roper designated healthcare decision maker(s) is/are bradshaw minihan (the patient's spouse) as denoted by stated patient preference.    Objective:   Physical Exam:  Temp:  [36.9 ??C (98.4 ??F)] 36.9 ??C (98.4 ??F)  Heart Rate:  [69-125] 69  SpO2 Pulse:  [88] 88  Respiration Rate:  [18] 18  BP: (137-140)/(93-104) 137/93  SpO2:  [98 %-100 %] 100 %    Gen: NAD, converses   Eyes: Sclera anicteric, EOMI grossly normal   HENT: Atraumatic, normocephalic, chapped lips  Neck: Trachea midline  Heart: RRR  Lungs: CTAB, no crackles or wheezes  Abdomen: Soft, ND, diffusely tender to palpation  Extremities: No edema  Neuro: Grossly symmetric, non-focal    Skin:  No rashes, lesions on clothed exam  Psych: Alert, oriented     Note Initiated by Manda Seats, MS3    I attest that I have reviewed the student note and that the components of the history of the present illness, the physical exam, and the assessment and plan documented were performed by me or were performed in my presence by the student where I verified the documentation and performed (or re-performed) the exam and medical decision making.  Ian Maine, MD PGY-3   [facility] Internal Medicine

## 2023-05-15 LAB — BASIC METABOLIC PANEL
ANION GAP: 10 mmol/L (ref 5–14)
BLOOD UREA NITROGEN: 6 mg/dL — ABNORMAL LOW (ref 9–23)
BUN / CREAT RATIO: 7
CALCIUM: 9.3 mg/dL (ref 8.7–10.4)
CHLORIDE: 102 mmol/L (ref 98–107)
CO2: 22 mmol/L (ref 20.0–31.0)
CREATININE: 0.86 mg/dL (ref 0.73–1.18)
EGFR CKD-EPI (2021) MALE: >90 mL/min/1.73m2 (ref >=60)
GLUCOSE RANDOM: 80 mg/dL (ref 70–179)
POTASSIUM: 4.1 mmol/L (ref 3.4–4.8)
SODIUM: 134 mmol/L — ABNORMAL LOW (ref 135–145)

## 2023-05-15 LAB — CBC
HEMATOCRIT: 35.2 % — ABNORMAL LOW (ref 39.0–48.0)
HEMOGLOBIN: 11.7 g/dL — ABNORMAL LOW (ref 12.9–16.5)
MEAN CORPUSCULAR HEMOGLOBIN CONC: 33.2 g/dL (ref 32.0–36.0)
MEAN CORPUSCULAR HEMOGLOBIN: 31.4 pg (ref 25.9–32.4)
MEAN CORPUSCULAR VOLUME: 94.6 fL (ref 77.6–95.7)
MEAN PLATELET VOLUME: 6.9 fL (ref 6.8–10.7)
PLATELET COUNT: 317 10*9/L (ref 150–450)
RED BLOOD CELL COUNT: 3.73 10*12/L — ABNORMAL LOW (ref 4.26–5.60)
RED CELL DISTRIBUTION WIDTH: 13.8 % (ref 12.2–15.2)
WBC ADJUSTED: 6.3 10*9/L (ref 3.6–11.2)

## 2023-05-15 LAB — C-REACTIVE PROTEIN: C-REACTIVE PROTEIN: <5 mg/L (ref <=10.0)

## 2023-05-15 MED ADMIN — oxyCODONE (ROXICODONE) immediate release tablet 5 mg: 5 mg | ORAL | @ 04:00:00 | Stop: 2023-05-28

## 2023-05-15 MED ADMIN — dicyclomine (BENTYL) tablet 20 mg: 20 mg | ORAL | @ 03:00:00

## 2023-05-15 MED ADMIN — dronabinol (MARINOL) capsule 10 mg: 10 mg | ORAL | @ 21:00:00

## 2023-05-15 MED ADMIN — gabapentin (NEURONTIN) capsule 600 mg: 600 mg | ORAL | @ 19:00:00

## 2023-05-15 MED ADMIN — gabapentin (NEURONTIN) capsule 600 mg: 600 mg | ORAL | @ 03:00:00

## 2023-05-15 MED ADMIN — multivitamins, therapeutic with minerals tablet 1 tablet: 1 | ORAL | @ 13:00:00

## 2023-05-15 MED ADMIN — busPIRone (BUSPAR) tablet 20 mg: 20 mg | ORAL | @ 13:00:00

## 2023-05-15 MED ADMIN — oxyCODONE (ROXICODONE) immediate release tablet 10 mg: 10 mg | ORAL | @ 15:00:00 | Stop: 2023-05-29

## 2023-05-15 MED ADMIN — sodium chloride 0.9% (NS) bolus 1,000 mL: 1000 mL | INTRAVENOUS | @ 13:00:00 | Stop: 2023-05-15

## 2023-05-15 MED ADMIN — polyethylene glycol (MIRALAX) packet 238 g: 238 g | ORAL | @ 22:00:00 | Stop: 2023-05-15

## 2023-05-15 MED ADMIN — gabapentin (NEURONTIN) capsule 600 mg: 600 mg | ORAL | @ 13:00:00

## 2023-05-15 MED ADMIN — dronabinol (MARINOL) capsule 10 mg: 10 mg | ORAL | @ 13:00:00

## 2023-05-15 MED ADMIN — pantoprazole (Protonix) EC tablet 40 mg: 40 mg | ORAL | @ 21:00:00

## 2023-05-15 MED ADMIN — ondansetron (ZOFRAN-ODT) disintegrating tablet 4 mg: 4 mg | ORAL | @ 13:00:00 | Stop: 2023-05-15

## 2023-05-15 MED ADMIN — OLANZapine (ZYPREXA) tablet 2.5 mg: 2.5 mg | ORAL | @ 03:00:00

## 2023-05-15 MED ADMIN — pantoprazole (Protonix) EC tablet 40 mg: 40 mg | ORAL | @ 13:00:00

## 2023-05-15 MED ADMIN — dicyclomine (BENTYL) tablet 20 mg: 20 mg | ORAL | @ 19:00:00

## 2023-05-15 MED ADMIN — morphine 4 mg/mL injection 4 mg: 4 mg | INTRAVENOUS | @ 01:00:00 | Stop: 2023-05-14

## 2023-05-15 MED ADMIN — citalopram (CeleXA) tablet 40 mg: 40 mg | ORAL | @ 13:00:00

## 2023-05-15 MED ADMIN — ondansetron (ZOFRAN) injection 4 mg: 4 mg | INTRAVENOUS | @ 15:00:00

## 2023-05-15 MED ADMIN — ferrous sulfate tablet 325 mg: 325 mg | ORAL | @ 13:00:00

## 2023-05-15 MED ADMIN — promethazine (PHENERGAN) 12.5 mg in sodium chloride (NS) 0.9 % 50 mL IVPB: 12.5 mg | INTRAVENOUS | @ 01:00:00 | Stop: 2023-05-14

## 2023-05-15 MED ADMIN — busPIRone (BUSPAR) tablet 20 mg: 20 mg | ORAL | @ 03:00:00

## 2023-05-15 MED ADMIN — busPIRone (BUSPAR) tablet 20 mg: 20 mg | ORAL | @ 19:00:00

## 2023-05-15 MED ADMIN — ondansetron (ZOFRAN) injection 4 mg: 4 mg | INTRAVENOUS | @ 21:00:00

## 2023-05-15 MED ADMIN — oxyCODONE (ROXICODONE) immediate release tablet 10 mg: 10 mg | ORAL | @ 21:00:00 | Stop: 2023-05-29

## 2023-05-15 MED ADMIN — dicyclomine (BENTYL) tablet 20 mg: 20 mg | ORAL | @ 13:00:00

## 2023-05-15 NOTE — Unmapped (Addendum)
 Rodney Bender is a 53 y.o. male with a PMHx of Crohn's on Stelara , GERD, Anxiety and HTN  who presented to [facility] with 1 month of nausea, vomiting, abdominal pain, and diarrhea with hematochezia.  Concern for IBD flare and GI consulted and now s/p scop without active disease.  Stable and will plan on dc home with PCP and GI follow up.  Below are more details of his stay at [facility] according to problem:      Diarrhea - Hematochezia- with concern for Crohn's Flare  Patient continues to endorse diffuse abdominal pain and nausea w/o vomiting 4/17. Patient mentioned his home dronabinol  does not control his nausea well. He had 2 loose bowel movements overnight. IV fluids ordered iso NPO and continued diarrhea. These symptoms are similar to his Crohn's flares in the past, though etiology is still unknown; Crohn's vs. Infectious vs. Other. Crohn's more likely given positive CT showing rectosigmoid colitis and elevated ESR but C.diff and GIPP still pending. Patient remains afebrile. GI consulted and underwent colonoscopy 4/18 which actually showed no evidence of active colitis. Infectious studies were ordered but never able to be collected due to lack of frequent bowel movements. His pain and nausea improved by time of discharge. Unclear etiology but he improved with bowel rest and anti-emetics.        Chronic Problems:  Cervicalgia and chronic abdominal pain: Follows with pain management at [facility] (Dr. Marica Shoals) per notes takes percocet  10-325mg   q8h as needed for pain, states he takes it pretty consistently. Will continue that inpatient.      Functional Dyspepsia: continue Zyprexa , pantropazole, and bentyl      Depression/Anxiety: Continue home buspirone  (20mg  TID), citalopram  (40mg  daily), olanzapine  (2.5mg  nightly)     GERD: continue home Protonix  (40mg  BID)

## 2023-05-15 NOTE — Unmapped (Signed)
 Patient resting in the bed, AAOx4. RR even unlabored. Patient precautions in place, bed in lowest position, call light within reach. Patient has no questions at this time

## 2023-05-15 NOTE — Unmapped (Signed)
 [facility]   Initial Consultation         Assessment and Recommendations:   Rodney Bender is a 53 y.o. male with a PMHx of Crohn's disease s/p ileocecectomy, GERD, anxiety, and HTN who presented to [facility] with abdominal pain, diarrhea, and hematochezia. The patient is seen in consultation at the request of Heddie Lively, MD ([facility] ([facility])) for hematochezia and Crohn's disease.    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 Given his symptoms, recommend ruling out infectious causes of worsening colitis as well as colonoscopy to restage disease activity. Patient's nutrition and volume status should also be optimized as he reports poor PO intake.     Recommendations:  - GIPP, c diff, calprotectin  - Trend daily CMP, CBC, CRP  - Fluid resuscitation PRN  - ensure chemical DVT ppx      GI Pre-Procedure Checklist  Procedure: Colonoscopy  Anticipated Date of Procedure: 04/18  Anticoagulants/Antiplatelets: Hold DVT prophylaxis day of procedure  Anesthesia Concerns: None  Diet: Order clear liquid and make NPO at 12AM (midnight) day of procedure  Prep: Give 4L Golytely  bowel prep this afternoon. Order bowel prep using MED GI PROCEDURES PREP order-set. You must select the appropriate prep, this is not auto-selected. If Golytely  is not available from pharmacy, please give Gatorade+Miralax  prep. The patient is adequately prepped when their stool is clear and yellow, like urine. Continue to order bowel prep until the patients stools are clear. The patient can continue drinking prep past midnight if not clear, but must be strictly NPO of all oral intake for at least 2 hours before procedure.      Issues Impacting Complexity of Management:  -None    Recommendations discussed with the patient's primary team. We will continue to follow along with you.    Subjective:   Patient with a one month history of abdominal pain and 5-6 bloody bowel movements daily, with bowel movements overnight as well. The bowel movements are not always bloody, but most recently noticed some blood in his stool this morning. He is on stelara  and has been for several years. He is unsure when he started this medication. He takes this every eight weeks. His last injection was around 8 weeks ago.     He endorses weight loss, but is not sure how much. He also reports poor appetite, and states that he does not remember the last time ate something. He denies eating dinner last night or breakfast yesterday. He is able to drink small amounts of water. He denies skin or eye symptoms.    His last colonoscopy was in 09/2021 showed mucosal inflammatory changes in the right colon which were mild but disease otherwise in remission. EGD was performed in 10/2022 - he was noted to have a widely patent schatzki's ring, dilated and treated with botox . He repots ongoing symptoms of epigastric pain but was able to tolerate solid foods like rice and chicken before developing worsening diarrhea.     He has a history of an ileocecectomy and several revisions. He is unsure when his first surgery was done but believes that it was done at [facility].   Per chart review:   Ileocolectomy with anastomosis and multiple IR drains about 5 cm ileum and colon (09/16/2016)  Ileocolectomy about  15 cm (2009)  Ileocecectomy unclear extent (1998)    Review of endoscopy found:  Colonoscopy - 10/18/21 - i0 at anastomosis, SES-CD 1  Upper endoscopy - 10/18/21 - nl  Colonoscopy - 05/15/2020 - i1 at anastomosis, SESCD 3, one 2 mm polyp  Colonoscopy - 02/12/2019 - i1 at anastomosis, no more proximal ileal inflammation  Colonoscopy - 04/06/2018 - i0 at anastomosis  Colonoscopy - 03/21/2017 - i1 at anastomosis, one 2mm polup  Colonoscopy - 08/09/2016 - occlusive ulcerated stricture at anastomosis   Upper endoscopy - 07/15/2014 - nl  Colonoscopy - 07/15/2014 - inflammed anastomosis dilated to 12-36mm, scattered apthae distal ileum  Colonoscopy - 07/21/2013 - i0 anastomosis, nl TI, hemorrhoids  Upper endoscopy - 07/21/2013 - nl  Upper endoscopy - 11/10/2012 - nl  Colonoscopy - 11/10/2012 - traversable anastomosis with i4 inflammation, and ulcerations in the TI  Note procedures on record back to 06/12/1998    -I have reviewed the patient's prior records from [facility] as summarized in the HPI    Objective:   Temp:  [36.7 ??C (98.1 ??F)-36.9 ??C (98.4 ??F)] 36.7 ??C (98.1 ??F)  Pulse:  [66-125] 75  SpO2 Pulse:  [88] 88  Resp:  [17-18] 17  BP: (119-156)/(91-104) 156/91  SpO2:  [98 %-100 %] 100 %    Consitutional: cachectic, uncomfortable appearing lying in bed.   HEENT: normocephalic and atraumatic without visible abnormalities  Cardiac: warm and well perfused  Resp: normal work of breathing on room air  Abdominal: non-distended  Extremities: upper and lower extremities are atraumatic without visible deformity.  Skin: warm, dry, intact  Neuro: Awake, alert and oriented to place, person, time, and situation. Normal speech.   Psychiatric: low mood.     Pertinent Labs & Studies:  -I have reviewed the patient's labs from 05/15/23 which show stable Hgb and elevated Sed Rate at 38, undetectable CRP . Hyponatremic at 131.     EGD 10/2022   Impression:            - Normal esophagus. Injected with botulinum toxin.                          Dilated.                         - Normal stomach.                         - The examination was otherwise normal.                         - FLIP imaging performed. Consistent with normal                          esophago-gastric junction opening.                         - No specimens collected.    Colonoscopy 09/2021   mpression:            - Simple Endoscopic Score for Crohn's Disease: 1,                          mucosal inflammatory changes secondary to Crohn's  disease, in remission (mild inflammation in right colon)                          - The examination was otherwise normal on direct and                          retroflexion views.                         - No specimens collected.

## 2023-05-15 NOTE — Unmapped (Signed)
 colonoscopy

## 2023-05-15 NOTE — Unmapped (Signed)
 Internal Medicine (MEDU) Progress Note    Assessment & Plan:   Rodney Bender is a 53 y.o. male with a PMHx of Crohn's on Stelara, GERD, Anxiety and HTN  that presented to [facility] with 1 month of nausea, vomiting, abdominal pain, and diarrhea with hematochezia.     Principal Problem:    Crohn's disease of both small and large intestine with other complication  Active Problems:    Chronic nausea    Chronic abdominal pain      Diarrhea - Hematochezia- Suspected Crohn's Flare  Patient continues to endorse diffuse abdominal pain and nausea w/o vomiting 4/17. Patient mentioned his home dronabinol does not control his nausea well. He had 2 loose bowel movements overnight. IV fluids ordered iso NPO and continued diarrhea. These symptoms are similar to his Crohn's flares in the past, though etiology is still unknown; Crohn's vs. Infectious vs. Other. Crohn's more likely given positive CT showing rectosigmoid colitis and elevated ESR but C.diff and GIPP still pending. Patient remains afebrile. GI consulted and plan for flex sig vs. Colonoscopy.   - Follow up GIPP and C diff assay  - GI following, appreciate recs  - NPO since midnight for flex sig vs. Colonoscopy   - Daily CBC, BMP, and CRP  - Start Zofran 4mg  prn   - Increase Oxycodone to 10mg  q4h BID for pain   - Continue home dicyclomine 20mg  TID  - Discontinue home dronabinol 10mg  BID  - Continue home gabapentin 600mg  TID  - Continue home multivitamin and ferrous sulfate tablet (325mg  every other day)    Chronic Problems:  Cervicalgia and chronic abdominal pain: Follows with pain management at [facility] (Dr. Marica Shoals) per notes takes percocet 10-325mg   q8h as needed for pain, states he takes it pretty consistently. Will continue that inpatient.     Functional Dyspepsia: continue Zyprexa, pantropazole, and bentyl    Depression/Anxiety: Continue home buspirone (20mg  TID), citalopram (40mg  daily), olanzapine (2.5mg  nightly)    GERD: continue home Protonix (40mg  BID)    Daily Checklist:  Diet: NPO  DVT PPx: Lovenox 40mg  q24h  Electrolytes: No Repletion Needed  Code Status: DNR and DNI  Dispo:   Patient appropriate for Inpatient based on expectation of ongoing need for hospitalization greater than two midnights based on severity of presentation/services including inpatient evaluation of chron's flare    Team Contact Information:   Primary Team: Internal Medicine (MEDU)  Primary Resident: Ames Bakes, CNA, MD  Resident's Pager: [phone] [facility])    Interval History:   No acute events overnight. Vitals remain stable but patient continues to have diffuse abdominal pain and nausea. Increased his oxycodone and switched nausea medication to Zofran. Patient had his wife on the phone and was able to introduce the team, they were worried about dehydration and we explained that we ordered fluids for dehydration and continued NPO order iso diarrhea.     All other systems were reviewed and are negative except as noted in the HPI    Objective:   Temp:  [36.4 ??C (97.5 ??F)-36.9 ??C (98.4 ??F)] 36.4 ??C (97.5 ??F)  Pulse:  [59-125] 59  SpO2 Pulse:  [88] 88  Resp:  [16-18] 16  BP: (109-156)/(66-104) 109/66  SpO2:  [98 %-100 %] 98 %    Gen: Seems uncomfortable, answers questions appropriately  Eyes: sclera anicteric   HENT: atraumatic  Heart: RRR, no chest wall tenderness  Lungs: No crackles or wheezes, NWB  Abdomen: Diffuse abdominal pain, no  rebound/guarding  Extremities: No edema in the BLEs  Psych: Alert, oriented, appropriate mood and affect    Labs/Studies: Labs and Studies from the last 24hrs per EMR and Reviewed    Mariella Shore, MS3    I attest that I have reviewed the medical student note and that the components of the history of the present illness, the physical exam, and the assessment and plan documented were performed by me or were performed in my presence by the student where I verified the documentation and performed (or re-performed) the exam and medical decision making. Custer Downs, MD

## 2023-05-16 LAB — BASIC METABOLIC PANEL
ANION GAP: 10 mmol/L (ref 5–14)
BLOOD UREA NITROGEN: 7 mg/dL — ABNORMAL LOW (ref 9–23)
BUN / CREAT RATIO: 8
CALCIUM: 9.4 mg/dL (ref 8.7–10.4)
CHLORIDE: 101 mmol/L (ref 98–107)
CO2: 25 mmol/L (ref 20.0–31.0)
CREATININE: 0.88 mg/dL (ref 0.73–1.18)
EGFR CKD-EPI (2021) MALE: >90 mL/min/1.73m2 (ref >=60)
GLUCOSE RANDOM: 75 mg/dL (ref 70–179)
POTASSIUM: 3.4 mmol/L (ref 3.4–4.8)
SODIUM: 136 mmol/L (ref 135–145)

## 2023-05-16 LAB — CBC
HEMATOCRIT: 32.3 % — ABNORMAL LOW (ref 39.0–48.0)
HEMOGLOBIN: 11.1 g/dL — ABNORMAL LOW (ref 12.9–16.5)
MEAN CORPUSCULAR HEMOGLOBIN CONC: 34.6 g/dL (ref 32.0–36.0)
MEAN CORPUSCULAR HEMOGLOBIN: 32.4 pg (ref 25.9–32.4)
MEAN CORPUSCULAR VOLUME: 93.7 fL (ref 77.6–95.7)
MEAN PLATELET VOLUME: 7.6 fL (ref 6.8–10.7)
PLATELET COUNT: 290 10*9/L (ref 150–450)
RED BLOOD CELL COUNT: 3.44 10*12/L — ABNORMAL LOW (ref 4.26–5.60)
RED CELL DISTRIBUTION WIDTH: 13.7 % (ref 12.2–15.2)
WBC ADJUSTED: 6.9 10*9/L (ref 3.6–11.2)

## 2023-05-16 LAB — C-REACTIVE PROTEIN: C-REACTIVE PROTEIN: <5 mg/L (ref <=10.0)

## 2023-05-16 MED ORDER — CITALOPRAM 40 MG TABLET
ORAL_TABLET | Freq: Every day | ORAL | 0 refills | 30.00 days
Start: 2023-05-16 — End: ?

## 2023-05-16 MED ADMIN — busPIRone (BUSPAR) tablet 20 mg: 20 mg | ORAL | @ 02:00:00

## 2023-05-16 MED ADMIN — lactated Ringers infusion: INTRAVENOUS | @ 15:00:00 | Stop: 2023-05-16

## 2023-05-16 MED ADMIN — pantoprazole (Protonix) EC tablet 40 mg: 40 mg | ORAL | @ 12:00:00

## 2023-05-16 MED ADMIN — Propofol (DIPRIVAN) injection: INTRAVENOUS | @ 16:00:00 | Stop: 2023-05-16

## 2023-05-16 MED ADMIN — multivitamins, therapeutic with minerals tablet 1 tablet: 1 | ORAL | @ 12:00:00

## 2023-05-16 MED ADMIN — Propofol (DIPRIVAN) injection: INTRAVENOUS | @ 15:00:00 | Stop: 2023-05-16

## 2023-05-16 MED ADMIN — ondansetron (ZOFRAN) injection 4 mg: 4 mg | INTRAVENOUS | @ 09:00:00

## 2023-05-16 MED ADMIN — dicyclomine (BENTYL) tablet 20 mg: 20 mg | ORAL | @ 02:00:00

## 2023-05-16 MED ADMIN — pantoprazole (Protonix) EC tablet 40 mg: 40 mg | ORAL | @ 22:00:00

## 2023-05-16 MED ADMIN — dronabinol (MARINOL) capsule 10 mg: 10 mg | ORAL | @ 22:00:00

## 2023-05-16 MED ADMIN — busPIRone (BUSPAR) tablet 20 mg: 20 mg | ORAL | @ 12:00:00

## 2023-05-16 MED ADMIN — lactated ringers bolus 1,000 mL: 1000 mL | INTRAVENOUS | @ 22:00:00 | Stop: 2023-05-16

## 2023-05-16 MED ADMIN — OLANZapine (ZYPREXA) tablet 2.5 mg: 2.5 mg | ORAL | @ 02:00:00

## 2023-05-16 MED ADMIN — glycopyrrolate (ROBINUL) injection: INTRAVENOUS | @ 16:00:00 | Stop: 2023-05-16

## 2023-05-16 MED ADMIN — citalopram (CeleXA) tablet 40 mg: 40 mg | ORAL | @ 12:00:00

## 2023-05-16 MED ADMIN — gabapentin (NEURONTIN) capsule 600 mg: 600 mg | ORAL | @ 02:00:00

## 2023-05-16 MED ADMIN — oxyCODONE (ROXICODONE) immediate release tablet 10 mg: 10 mg | ORAL | @ 22:00:00 | Stop: 2023-05-29

## 2023-05-16 MED ADMIN — gabapentin (NEURONTIN) capsule 600 mg: 600 mg | ORAL | @ 12:00:00

## 2023-05-16 MED ADMIN — gabapentin (NEURONTIN) capsule 600 mg: 600 mg | ORAL | @ 18:00:00

## 2023-05-16 MED ADMIN — oxyCODONE (ROXICODONE) immediate release tablet 10 mg: 10 mg | ORAL | @ 09:00:00 | Stop: 2023-05-29

## 2023-05-16 MED ADMIN — dicyclomine (BENTYL) tablet 20 mg: 20 mg | ORAL | @ 18:00:00

## 2023-05-16 MED ADMIN — busPIRone (BUSPAR) tablet 20 mg: 20 mg | ORAL | @ 18:00:00

## 2023-05-16 MED ADMIN — dicyclomine (BENTYL) tablet 20 mg: 20 mg | ORAL | @ 12:00:00

## 2023-05-16 MED ADMIN — oxyCODONE (ROXICODONE) immediate release tablet 10 mg: 10 mg | ORAL | @ 17:00:00 | Stop: 2023-05-29

## 2023-05-16 MED ADMIN — ondansetron (ZOFRAN) injection 4 mg: 4 mg | INTRAVENOUS | @ 17:00:00

## 2023-05-16 MED ADMIN — ondansetron (ZOFRAN) injection 4 mg: 4 mg | INTRAVENOUS | @ 03:00:00

## 2023-05-16 MED ADMIN — lidocaine (PF) (XYLOCAINE-MPF) 20 mg/mL (2 %) injection: INTRAVENOUS | @ 15:00:00 | Stop: 2023-05-16

## 2023-05-16 MED ADMIN — oxyCODONE (ROXICODONE) immediate release tablet 10 mg: 10 mg | ORAL | @ 03:00:00 | Stop: 2023-05-29

## 2023-05-16 MED ADMIN — dronabinol (MARINOL) capsule 10 mg: 10 mg | ORAL | @ 12:00:00

## 2023-05-16 MED ADMIN — ondansetron (ZOFRAN) injection 4 mg: 4 mg | INTRAVENOUS | @ 22:00:00

## 2023-05-16 MED ADMIN — lactated Ringers infusion: 10 mL/h | INTRAVENOUS | @ 15:00:00

## 2023-05-16 NOTE — Unmapped (Signed)
 Internal Medicine (MEDU) Progress Note    Assessment & Plan:   Rodney Bender is a 53 y.o. male with a PMHx of Crohn's on Stelara, GERD, Anxiety and HTN  that presented to [facility] with 1 month of nausea, vomiting, abdominal pain, and diarrhea with hematochezia.     Principal Problem:    Crohn's disease of both small and large intestine with other complication  Active Problems:    Chronic nausea    Chronic abdominal pain      Diarrhea - Hematochezia- Suspected Crohn's Flare  Patient continues to endorse diffuse abdominal pain and nausea w/o vomiting 4/17. Patient mentioned his home dronabinol does not control his nausea well. He had 2 loose bowel movements overnight. IV fluids ordered iso NPO and continued diarrhea. These symptoms are similar to his Crohn's flares in the past, though etiology is still unknown; Crohn's vs. Infectious vs. Other. Crohn's more likely given positive CT showing rectosigmoid colitis and elevated ESR but C.diff and GIPP still pending.  There was no ulceration or obvious signs of Crohn's flare on 4/18 colonoscopy, suggesting alternative pathology.  - Follow up GIPP and C diff assay  - GI following, appreciate recs  - S/p colonoscopy on 4/18  - Daily CBC, BMP, and CRP  - Continue Zofran 4mg  prn   - Continue oxycodone to 10-15 mg panel q4h BID for pain   - Continue home dicyclomine 20mg  TID  - Continue home gabapentin 600mg  TID  - Continue home multivitamin and ferrous sulfate tablet (325mg  every other day)    Chronic Problems:  Cervicalgia and chronic abdominal pain: Follows with pain management at [facility] (Dr. Marica Shoals) per notes takes percocet 10-325mg   q8h as needed for pain, states he takes it pretty consistently. Will continue that inpatient.     Functional Dyspepsia: continue Zyprexa, pantropazole, and bentyl    Depression/Anxiety: Continue home buspirone (20mg  TID), citalopram (40mg  daily), olanzapine (2.5mg  nightly)    GERD: continue home Protonix (40mg  BID)    Daily Checklist:  Diet: NPO  DVT PPx: Lovenox 40mg  q24h  Electrolytes: No Repletion Needed  Code Status: DNR and DNI  Dispo:   Patient appropriate for Inpatient based on expectation of ongoing need for hospitalization greater than two midnights based on severity of presentation/services including inpatient evaluation of chron's flare    Team Contact Information:   Primary Team: Internal Medicine (MEDU)  Primary Resident: Jacqualyn Mates, MD, MD  Resident's Pager: [phone] [facility])    Interval History:   No acute events overnight.  Patient was found resting in bed after endoscopic procedure with wife at bedside.  He endorsed mild nausea and continued abdominal pain, but manage a full meal of chicken noodle soup, sandwich, and a slice of cheese cake with no issue.  No additional complaints, all questions were answered.    Objective:   Temp:  [36.2 ??C (97.2 ??F)-37 ??C (98.6 ??F)] 37 ??C (98.6 ??F)  Pulse:  [53-78] 70  SpO2 Pulse:  [61-74] 63  Resp:  [12-18] 18  BP: (115-173)/(73-99) 132/83  SpO2:  [93 %-100 %] 97 %    Gen: NAD, pleasant, converses, resting comfortably  Eyes: sclera anicteric   HENT: atraumatic  Heart: RRR, no murmurs or bruits  Lungs: CTAB, normal WOB, no wheezes or rhonchi  Abdomen: Soft, diffuse TTP, NABS x 4  Extremities: No edema  Psych: Alert, oriented, appropriate mood and affect    Labs/Studies: Labs and Studies from the last 24hrs per EMR and Reviewed  Sharyne Degree MD, PhD  PGY-2  Internal Medicine

## 2023-05-16 NOTE — Unmapped (Signed)
 Pt went to dialysis. Consistently in pain and complains of nausea, managed with Oxycodone  and Zofran . Compliant with care. Safety protocols in place; call light within reach, bed in lowest position.   Problem: Adult Inpatient Plan of Care  Goal: Absence of Hospital-Acquired Illness or Injury  Intervention: Identify and Manage Fall Risk  Recent Flowsheet Documentation  Taken 05/16/2023 1600 by Vernice Bowker C, RN  Safety Interventions:   fall reduction program maintained   family at bedside   lighting adjusted for tasks/safety   low bed   nonskid shoes/slippers when out of bed  Taken 05/16/2023 1400 by Hussien Greenblatt C, RN  Safety Interventions:   fall reduction program maintained   lighting adjusted for tasks/safety   low bed   nonskid shoes/slippers when out of bed  Taken 05/16/2023 1000 by Raahi Korber C, RN  Safety Interventions:   fall reduction program maintained   lighting adjusted for tasks/safety   low bed   nonskid shoes/slippers when out of bed  Taken 05/16/2023 0800 by Quintin Hjort C, RN  Safety Interventions:   fall reduction program maintained   lighting adjusted for tasks/safety   low bed   nonskid shoes/slippers when out of bed  Intervention: Prevent Skin Injury  Recent Flowsheet Documentation  Taken 05/16/2023 1600 by Burrel Legrand C, RN  Positioning for Skin: Supine/Back  Taken 05/16/2023 1400 by Gwenna Fuston C, RN  Positioning for Skin: Right  Taken 05/16/2023 1000 by Twila Rappa C, RN  Positioning for Skin: Supine/Back  Taken 05/16/2023 0800 by Nayana Lenig C, RN  Positioning for Skin: Supine/Back  Taken 05/16/2023 0715 by Rosanna Comment C, RN  Positioning for Skin: Left     Problem: Wound  Goal: Optimal Functional Ability  Intervention: Optimize Functional Ability  Recent Flowsheet Documentation  Taken 05/16/2023 0800 by Murriel Holwerda C, RN  Activity Management: ambulated to bathroom  Goal: Skin Health and Integrity  Intervention: Optimize Skin Protection  Recent Flowsheet Documentation  Taken 05/16/2023 1600 by Xaria Judon C, RN  Pressure Reduction Techniques: frequent weight shift encouraged  Pressure Reduction Devices: pressure-redistributing mattress utilized  Taken 05/16/2023 1400 by Averie Meiner C, RN  Pressure Reduction Techniques: frequent weight shift encouraged  Pressure Reduction Devices: pressure-redistributing mattress utilized  Taken 05/16/2023 1000 by Kallen Mccrystal C, RN  Pressure Reduction Techniques: frequent weight shift encouraged  Pressure Reduction Devices: pressure-redistributing mattress utilized  Taken 05/16/2023 0800 by Kaori Jumper C, RN  Activity Management: ambulated to bathroom  Pressure Reduction Techniques: frequent weight shift encouraged  Pressure Reduction Devices: pressure-redistributing mattress utilized  Taken 05/16/2023 0715 by Daekwon Beswick C, RN  Pressure Reduction Techniques: frequent weight shift encouraged  Pressure Reduction Devices: pressure-redistributing mattress utilized     Problem: Fall Injury Risk  Goal: Absence of Fall and Fall-Related Injury  Intervention: Promote Scientist, clinical (histocompatibility and immunogenetics) Documentation  Taken 05/16/2023 1600 by Joyceann Kruser C, RN  Safety Interventions:   fall reduction program maintained   family at bedside   lighting adjusted for tasks/safety   low bed   nonskid shoes/slippers when out of bed  Taken 05/16/2023 1400 by Maysoon Lozada C, RN  Safety Interventions:   fall reduction program maintained   lighting adjusted for tasks/safety   low bed   nonskid shoes/slippers when out of bed  Taken 05/16/2023 1000 by Pantelis Elgersma C, RN  Safety Interventions:   fall reduction program maintained   lighting adjusted for tasks/safety   low bed   nonskid shoes/slippers when out of bed  Taken 05/16/2023 0800 by Tionne Carelli C, RN  Safety Interventions:   fall reduction program maintained   lighting adjusted for tasks/safety   low bed   nonskid shoes/slippers when out of bed

## 2023-05-16 NOTE — Unmapped (Signed)
 [facility]   Progress Note         Assessment and Recommendations:   Rodney Bender is a 53 y.o. male with a PMHx of Crohn's disease s/p ileocecectomy, GERD, anxiety, and HTN who presented to [facility] with abdominal pain, diarrhea, and hematochezia. The patient is seen in consultation at the request of Heddie Lively, MD ([facility] ([facility])) for hematochezia and Crohn's disease.     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    Colonoscopy 4/18  Healthy appearing ileocolonic anastomosis, normal appearing ileal and colonic mucosa. Crohn's Disease in remission     Recommendations:   - please ensure GIPP, Cdiff are collected   - no changes to current medication regimen   - continue chemical DVT ppx while hospitalized     We will arrange follow up with outpatient GI provider.         Issues Impacting Complexity of Management:  -None    Recommendations discussed with the patient's primary team. We will continue to follow along with you.    Subjective:   No acute events overnight   Pt completed bowel prep without issue         Objective:   Temp:  [36.2 ??C (97.2 ??F)-37 ??C (98.6 ??F)] 36.9 ??C (98.4 ??F)  Pulse:  [53-78] 71  SpO2 Pulse:  [61-74] 63  Resp:  [12-18] 18  BP: (96-173)/(65-99) 131/88  SpO2:  [93 %-100 %] 99 %    Gen: WDWN male in NAD, answers questions appropriately  Abdomen: Soft, NTND, no rebound/guarding  Extremities: No edema in the BLEs    Pertinent Labs & Studies:  -I have reviewed the patient's labs from 05/16/23 which show stable Hgb   CRP undetectable     CTAP 4/18   Possible early rectosigmoid colitis given mild mural enhancement and questionable engorgement of the vasa recta. Otherwise, no acute abnormality in the abdomen or pelvis

## 2023-05-16 NOTE — Unmapped (Signed)
 Problem: Adult Inpatient Plan of Care  Goal: Plan of Care Review  Outcome: Ongoing - Unchanged  Flowsheets (Taken 05/16/2023 1758)  Progress: no change  Plan of Care Reviewed With: patient  Goal: Patient-Specific Goal (Individualized)  Outcome: Ongoing - Unchanged  Goal: Absence of Hospital-Acquired Illness or Injury  Outcome: Ongoing - Unchanged  Intervention: Identify and Manage Fall Risk  Recent Flowsheet Documentation  Taken 05/16/2023 1600 by Travian Kerner C, RN  Safety Interventions:   fall reduction program maintained   family at bedside   lighting adjusted for tasks/safety   low bed   nonskid shoes/slippers when out of bed  Taken 05/16/2023 1400 by Markelle Asaro C, RN  Safety Interventions:   fall reduction program maintained   lighting adjusted for tasks/safety   low bed   nonskid shoes/slippers when out of bed  Taken 05/16/2023 1000 by Nashawn Hillock C, RN  Safety Interventions:   fall reduction program maintained   lighting adjusted for tasks/safety   low bed   nonskid shoes/slippers when out of bed  Taken 05/16/2023 0800 by Aaleigha Bozza C, RN  Safety Interventions:   fall reduction program maintained   lighting adjusted for tasks/safety   low bed   nonskid shoes/slippers when out of bed  Intervention: Prevent Skin Injury  Recent Flowsheet Documentation  Taken 05/16/2023 1600 by Jennyfer Nickolson C, RN  Positioning for Skin: Supine/Back  Taken 05/16/2023 1400 by Bartolo Montanye C, RN  Positioning for Skin: Right  Taken 05/16/2023 1000 by Kissie Ziolkowski C, RN  Positioning for Skin: Supine/Back  Taken 05/16/2023 0800 by Lallie Strahm C, RN  Positioning for Skin: Supine/Back  Taken 05/16/2023 0715 by Zell Doucette C, RN  Positioning for Skin: Left  Goal: Optimal Comfort and Wellbeing  Outcome: Ongoing - Unchanged  Goal: Readiness for Transition of Care  Outcome: Ongoing - Unchanged  Goal: Rounds/Family Conference  Outcome: Ongoing - Unchanged     Problem: Infection  Goal: Absence of Infection Signs and Symptoms  Outcome: Ongoing - Unchanged Problem: Wound  Goal: Optimal Coping  Outcome: Ongoing - Unchanged  Goal: Optimal Functional Ability  Outcome: Ongoing - Unchanged  Intervention: Optimize Functional Ability  Recent Flowsheet Documentation  Taken 05/16/2023 0800 by Tionne Dayhoff C, RN  Activity Management: ambulated to bathroom  Goal: Absence of Infection Signs and Symptoms  Outcome: Ongoing - Unchanged  Goal: Improved Oral Intake  Outcome: Ongoing - Unchanged  Goal: Optimal Pain Control and Function  Outcome: Ongoing - Unchanged  Goal: Skin Health and Integrity  Outcome: Ongoing - Unchanged  Intervention: Optimize Skin Protection  Recent Flowsheet Documentation  Taken 05/16/2023 1600 by Raine Blodgett C, RN  Pressure Reduction Techniques: frequent weight shift encouraged  Pressure Reduction Devices: pressure-redistributing mattress utilized  Taken 05/16/2023 1400 by Courtni Balash C, RN  Pressure Reduction Techniques: frequent weight shift encouraged  Pressure Reduction Devices: pressure-redistributing mattress utilized  Taken 05/16/2023 1000 by Carsen Machi C, RN  Pressure Reduction Techniques: frequent weight shift encouraged  Pressure Reduction Devices: pressure-redistributing mattress utilized  Taken 05/16/2023 0800 by Lattie Riege C, RN  Activity Management: ambulated to bathroom  Pressure Reduction Techniques: frequent weight shift encouraged  Pressure Reduction Devices: pressure-redistributing mattress utilized  Taken 05/16/2023 0715 by Judge Duque C, RN  Pressure Reduction Techniques: frequent weight shift encouraged  Pressure Reduction Devices: pressure-redistributing mattress utilized  Goal: Optimal Wound Healing  Outcome: Ongoing - Unchanged     Problem: Fall Injury Risk  Goal: Absence of Fall and Fall-Related Injury  Outcome:  Ongoing - Unchanged  Intervention: Promote Scientist, clinical (histocompatibility and immunogenetics) Documentation  Taken 05/16/2023 1600 by Bentlie Withem C, RN  Safety Interventions:   fall reduction program maintained   family at bedside   lighting adjusted for tasks/safety   low bed   nonskid shoes/slippers when out of bed  Taken 05/16/2023 1400 by Lamoine Fredricksen C, RN  Safety Interventions:   fall reduction program maintained   lighting adjusted for tasks/safety   low bed   nonskid shoes/slippers when out of bed  Taken 05/16/2023 1000 by Jailyn Leeson C, RN  Safety Interventions:   fall reduction program maintained   lighting adjusted for tasks/safety   low bed   nonskid shoes/slippers when out of bed  Taken 05/16/2023 0800 by Lennard Capek C, RN  Safety Interventions:   fall reduction program maintained   lighting adjusted for tasks/safety   low bed   nonskid shoes/slippers when out of bed

## 2023-05-16 NOTE — Unmapped (Signed)
 Awaiting colonoscopy this am, bowel prep ongoing still not clear at this time. Complained of pain and nausea, PRN medications was given.     Problem: Adult Inpatient Plan of Care  Goal: Plan of Care Review  Outcome: Progressing  Goal: Patient-Specific Goal (Individualized)  Outcome: Progressing  Goal: Absence of Hospital-Acquired Illness or Injury  Outcome: Progressing  Intervention: Identify and Manage Fall Risk  Recent Flowsheet Documentation  Taken 05/16/2023 0200 by Lawerence Pressman, RN  Safety Interventions:   fall reduction program maintained   lighting adjusted for tasks/safety   low bed  Taken 05/16/2023 0000 by Lawerence Pressman, RN  Safety Interventions:   fall reduction program maintained   lighting adjusted for tasks/safety   low bed  Taken 05/15/2023 2200 by Lawerence Pressman, RN  Safety Interventions:   fall reduction program maintained   lighting adjusted for tasks/safety   low bed  Taken 05/15/2023 2000 by Lawerence Pressman, RN  Safety Interventions:   low bed   lighting adjusted for tasks/safety  Intervention: Prevent Skin Injury  Recent Flowsheet Documentation  Taken 05/16/2023 0400 by Lawerence Pressman, RN  Positioning for Skin: Supine/Back  Taken 05/16/2023 0200 by Lawerence Pressman, RN  Positioning for Skin: Right  Taken 05/16/2023 0000 by Lawerence Pressman, RN  Positioning for Skin: Supine/Back  Taken 05/15/2023 2200 by Lawerence Pressman, RN  Positioning for Skin: Supine/Back  Taken 05/15/2023 2000 by Lawerence Pressman, RN  Positioning for Skin: Supine/Back  Skin Protection: incontinence pads utilized  Goal: Optimal Comfort and Wellbeing  Outcome: Progressing  Goal: Readiness for Transition of Care  Outcome: Progressing  Goal: Rounds/Family Conference  Outcome: Progressing

## 2023-05-17 LAB — BASIC METABOLIC PANEL
ANION GAP: 7 mmol/L (ref 5–14)
BLOOD UREA NITROGEN: 7 mg/dL — ABNORMAL LOW (ref 9–23)
BUN / CREAT RATIO: 8
CALCIUM: 9.3 mg/dL (ref 8.7–10.4)
CHLORIDE: 105 mmol/L (ref 98–107)
CO2: 28 mmol/L (ref 20.0–31.0)
CREATININE: 0.86 mg/dL (ref 0.73–1.18)
EGFR CKD-EPI (2021) MALE: >90 mL/min/1.73m2 (ref >=60)
GLUCOSE RANDOM: 87 mg/dL (ref 70–179)
POTASSIUM: 3.3 mmol/L — ABNORMAL LOW (ref 3.4–4.8)
SODIUM: 140 mmol/L (ref 135–145)

## 2023-05-17 LAB — CBC
HEMATOCRIT: 30.4 % — ABNORMAL LOW (ref 39.0–48.0)
HEMOGLOBIN: 10.4 g/dL — ABNORMAL LOW (ref 12.9–16.5)
MEAN CORPUSCULAR HEMOGLOBIN CONC: 34.4 g/dL (ref 32.0–36.0)
MEAN CORPUSCULAR HEMOGLOBIN: 32.3 pg (ref 25.9–32.4)
MEAN CORPUSCULAR VOLUME: 94 fL (ref 77.6–95.7)
MEAN PLATELET VOLUME: 7.3 fL (ref 6.8–10.7)
PLATELET COUNT: 273 10*9/L (ref 150–450)
RED BLOOD CELL COUNT: 3.23 10*12/L — ABNORMAL LOW (ref 4.26–5.60)
RED CELL DISTRIBUTION WIDTH: 13.5 % (ref 12.2–15.2)
WBC ADJUSTED: 6.5 10*9/L (ref 3.6–11.2)

## 2023-05-17 LAB — C-REACTIVE PROTEIN: C-REACTIVE PROTEIN: 7 mg/L (ref <=10.0)

## 2023-05-17 MED ORDER — CITALOPRAM 40 MG TABLET
ORAL_TABLET | Freq: Every day | ORAL | 0 refills | 30.00 days | Status: CP
Start: 2023-05-17 — End: ?

## 2023-05-17 MED ADMIN — lactated Ringers infusion: 10 mL/h | INTRAVENOUS | @ 03:00:00

## 2023-05-17 MED ADMIN — busPIRone (BUSPAR) tablet 20 mg: 20 mg | ORAL | @ 03:00:00

## 2023-05-17 MED ADMIN — gabapentin (NEURONTIN) capsule 600 mg: 600 mg | ORAL | @ 12:00:00 | Stop: 2023-05-17

## 2023-05-17 MED ADMIN — OLANZapine (ZYPREXA) tablet 2.5 mg: 2.5 mg | ORAL | @ 03:00:00

## 2023-05-17 MED ADMIN — ferrous sulfate tablet 325 mg: 325 mg | ORAL | @ 12:00:00 | Stop: 2023-05-17

## 2023-05-17 MED ADMIN — dicyclomine (BENTYL) tablet 20 mg: 20 mg | ORAL | @ 12:00:00 | Stop: 2023-05-17

## 2023-05-17 MED ADMIN — oxyCODONE (ROXICODONE) immediate release tablet 15 mg: 15 mg | ORAL | @ 10:00:00 | Stop: 2023-05-17

## 2023-05-17 MED ADMIN — oxyCODONE (ROXICODONE) immediate release tablet 15 mg: 15 mg | ORAL | @ 16:00:00 | Stop: 2023-05-17

## 2023-05-17 MED ADMIN — ondansetron (ZOFRAN) injection 4 mg: 4 mg | INTRAVENOUS | @ 10:00:00 | Stop: 2023-05-17

## 2023-05-17 MED ADMIN — multivitamins, therapeutic with minerals tablet 1 tablet: 1 | ORAL | @ 12:00:00 | Stop: 2023-05-17

## 2023-05-17 MED ADMIN — pantoprazole (Protonix) EC tablet 40 mg: 40 mg | ORAL | @ 12:00:00 | Stop: 2023-05-17

## 2023-05-17 MED ADMIN — busPIRone (BUSPAR) tablet 20 mg: 20 mg | ORAL | @ 12:00:00 | Stop: 2023-05-17

## 2023-05-17 MED ADMIN — ondansetron (ZOFRAN) injection 4 mg: 4 mg | INTRAVENOUS | @ 04:00:00 | Stop: 2023-05-17

## 2023-05-17 MED ADMIN — oxyCODONE (ROXICODONE) immediate release tablet 15 mg: 15 mg | ORAL | @ 04:00:00 | Stop: 2023-05-17

## 2023-05-17 MED ADMIN — citalopram (CeleXA) tablet 40 mg: 40 mg | ORAL | @ 12:00:00 | Stop: 2023-05-17

## 2023-05-17 MED ADMIN — dicyclomine (BENTYL) tablet 20 mg: 20 mg | ORAL | @ 03:00:00

## 2023-05-17 MED ADMIN — gabapentin (NEURONTIN) capsule 600 mg: 600 mg | ORAL | @ 03:00:00

## 2023-05-17 NOTE — Unmapped (Signed)
 Physician Discharge Summary [facility]  [facility] OBSERVATION [facility]  [address]  [place] [address]  Dept: [phone]  Loc: [phone]     Identifying Information:   Rodney Bender [facility]  1970/06/24  [id number]    Primary Care Physician: Lenore Rafter, MD     Code Status: DNR and DNI    Admit Date: 05/14/2023    Discharge Date: 05/17/2023     Discharge To: Home    Discharge Service: [facility] - General Medicine Floor Team (MED Verlene Glimpse)     Discharge Attending Physician: Heddie Lively, MD    Discharge Diagnoses:   Principal Problem:    Crohn's disease of both small and large intestine with other complication (POA: Yes)  Active Problems:    Chronic nausea (POA: Yes)    Chronic abdominal pain (POA: Yes)  Resolved Problems:    * No resolved hospital problems. [facility] Course:   Rodney Bender is a 53 y.o. male with a PMHx of Crohn's on Stelara , GERD, Anxiety and HTN  who presented to [facility] with 1 month of nausea, vomiting, abdominal pain, and diarrhea with hematochezia.  Concern for IBD flare and GI consulted and now s/p scop without active disease.  Stable and will plan on dc home with PCP and GI follow up.  Below are more details of his stay at [facility] according to problem:      Diarrhea - Hematochezia- with concern for Crohn's Flare  Patient continues to endorse diffuse abdominal pain and nausea w/o vomiting 4/17. Patient mentioned his home dronabinol  does not control his nausea well. He had 2 loose bowel movements overnight. IV fluids ordered iso NPO and continued diarrhea. These symptoms are similar to his Crohn's flares in the past, though etiology is still unknown; Crohn's vs. Infectious vs. Other. Crohn's more likely given positive CT showing rectosigmoid colitis and elevated ESR but C.diff and GIPP still pending. Patient remains afebrile. GI consulted and underwent colonoscopy 4/18 which actually showed no evidence of active colitis. Infectious studies were ordered but never able to be collected due to lack of frequent bowel movements. His pain and nausea improved by time of discharge. Unclear etiology but he improved with bowel rest and anti-emetics.        Chronic Problems:  Cervicalgia and chronic abdominal pain: Follows with pain management at [facility] (Dr. Marica Shoals) per notes takes percocet  10-325mg   q8h as needed for pain, states he takes it pretty consistently. Will continue that inpatient.      Functional Dyspepsia: continue Zyprexa , pantropazole, and bentyl      Depression/Anxiety: Continue home buspirone  (20mg  TID), citalopram  (40mg  daily), olanzapine  (2.5mg  nightly)     GERD: continue home Protonix  (40mg  BID)    The patient's hospital stay has been complicated by the following clinically significant conditions requiring additional evaluation and treatment or having a significant effect of this patient's care: - Malnutrition POA requiring further investigation, treatment, or monitoring           Outpatient Provider Follow Up Issues:   -Continue current Crohn's regimen, follow up with GI. Colonoscopy 4/18 showed no evidence of active disease, unclear what was driving his abdominal pain and nausea.     Touchbase with Outpatient Provider:  Warm Handoff: Completed on 05/17/23 by Custer Downs, MD  (Intern) via [facility] Message    Procedures:  colonoscopy  ______________________________________________________________________  Discharge Medications:      Your Medication List  STOP taking these medications      meloxicam 7.5 MG tablet  Commonly known as: MOBIC            START taking these medications      oxyCODONE-acetaminophen 10-325 mg per tablet  Commonly known as: PERCOCET  Take 1 tablet by mouth every eight (8) hours as needed.            CONTINUE taking these medications      busPIRone 10 MG tablet  Commonly known as: BUSPAR  Take 2 tablets (20 mg total) by mouth Three (3) times a day.     citalopram 40 MG tablet  Commonly known as: CeleXA  Take 1 tablet (40 mg total) by mouth daily.     cyclobenzaprine 5 MG tablet  Commonly known as: FLEXERIL     dicyclomine 20 mg tablet  Commonly known as: BENTYL     dronabinol 10 MG capsule  Commonly known as: MARINOL  Take 1 capsule (10 mg total) by mouth Two (2) times a day (30 minutes before a meal).     ferrous sulfate 325 (65 FE) MG tablet  TAKE 1 TABLET BY MOUTH EVERY OTHER DAY     gabapentin 300 MG capsule  Commonly known as: NEURONTIN  TAKE 2 CAPSULES BY MOUTH THREE TIMES DAILY     hydrOXYzine 25 MG tablet  Commonly known as: ATARAX  Take 1 tablet (25 mg total) by mouth Three (3) times a day as needed for anxiety.     loperamide 2 mg tablet  Commonly known as: IMODIUM A-D  Take 1 tablet (2 mg total) by mouth daily as needed.     naloxone 4 mg/actuation nasal spray  Commonly known as: NARCAN     OLANZapine 2.5 MG tablet  Commonly known as: ZYPREXA  Take 1 tablet by mouth nightly     ondansetron 4 MG disintegrating tablet  Commonly known as: ZOFRAN-ODT  Dissolve 2 tablets (8 mg total) in the mouth every eight (8) hours as needed for nausea.     pantoprazole 40 MG tablet  Commonly known as: Protonix  Take 1 tablet (40 mg total) by mouth Two (2) times a day (30 minutes before a meal).     promethazine 25 MG tablet  Commonly known as: PHENERGAN  Take 2 tablets (50 mg total) by mouth every six (6) hours as needed for nausea.     STELARA 90 mg/mL Syrg syringe  Generic drug: ustekinumab  Inject the contents of 1 syringe (90 mg total) under the skin every 8 weeks.     therapeutic multivitamin tablet  Commonly known as: THERAGRAN  Take 1 tablet by mouth daily.              Allergies:  Tramadol, Penicillins, Dilaudid [hydromorphone], and Morphine  ______________________________________________________________________  Pending Test Results:      Most Recent Labs:  All lab results last 24 hours -   Recent Results (from the past 24 hours)   Basic metabolic panel    Collection Time: 05/17/23  3:46 AM   Result Value Ref Range    Sodium 140 135 - 145 mmol/L    Potassium 3.3 (L) 3.4 - 4.8 mmol/L    Chloride 105 98 - 107 mmol/L    CO2 28.0 20.0 - 31.0 mmol/L    Anion Gap 7 5 - 14 mmol/L    BUN 7 (L) 9 - 23 mg/dL    Creatinine 4.78 2.95 - 1.18 mg/dL  BUN/Creatinine Ratio 8     eGFR CKD-EPI (2021) Male >90 >=60 mL/min/1.80m2    Glucose 87 70 - 179 mg/dL    Calcium  9.3 8.7 - 10.4 mg/dL   CBC    Collection Time: 05/17/23  3:46 AM   Result Value Ref Range    WBC 6.5 3.6 - 11.2 10*9/L    RBC 3.23 (L) 4.26 - 5.60 10*12/L    HGB 10.4 (L) 12.9 - 16.5 g/dL    HCT 29.5 (L) 62.1 - 48.0 %    MCV 94.0 77.6 - 95.7 fL    MCH 32.3 25.9 - 32.4 pg    MCHC 34.4 32.0 - 36.0 g/dL    RDW 30.8 65.7 - 84.6 %    MPV 7.3 6.8 - 10.7 fL    Platelet 273 150 - 450 10*9/L   C-reactive protein    Collection Time: 05/17/23  3:46 AM   Result Value Ref Range    CRP 7.0 <=10.0 mg/L       Relevant Studies/Radiology:  Colonoscopy  Result Date: 05/16/2023  _______________________________________________________________________________ Patient Name: Rodney Bender          Procedure Date: 05/16/2023 11:05 AM MRN: [id number]                     Date of Birth: 1970/02/03 Admit Type: Inpatient                 Age: 53 Room: GI [facility] OR 02 [facility]          Gender: Male Note Status: Finalized                Instrument Name: [id number] _______________________________________________________________________________  Procedure:             Colonoscopy Indications:           Disease activity assessment of Crohn's disease of the                        small bowel and colon Providers:             HANS Charle Congo, MD, Ala How, MD                        (Fellow), Laraine Plate SMALL, Jomarie Neer Referring MD:          Medicines:             Propofol  per Anesthesia, Monitored Anesthesia Care Complications:         No immediate complications. _______________________________________________________________________________ Procedure:             Pre-Anesthesia Assessment: - Prior to the procedure, a History and Physical was                        performed, and patient medications and allergies were                        reviewed. The patient's tolerance of previous                        anesthesia was also reviewed. The risks and benefits                        of the procedure and the sedation options and risks  were discussed with the patient. All questions were                        answered, and informed consent was obtained. Prior                        Anticoagulants: The patient has taken no anticoagulant                        or antiplatelet agents. ASA Grade Assessment: II - A                        patient with mild systemic disease. After reviewing                        the risks and benefits, the patient was deemed in                        satisfactory condition to undergo the procedure.                        After obtaining informed consent, the scope was passed                        under direct vision. Throughout the procedure, the                        patient's blood pressure, pulse, and oxygen                        saturations were monitored continuously. The                        Colonoscope was introduced through the anus and                        advanced to the the terminal ileum. The terminal                        ileum, ileocecal valve, appendiceal orifice, and                        rectum were photographed. The quality of the bowel                        preparation was fair.                                                                                Findings:      The perianal and digital rectal examinations were normal.      There was evidence of a prior side-to-side ileo-colonic anastomosis in      the ascending colon. This was patent and was characterized by healthy      appearing mucosa. The anastomosis was traversed.      The terminal ileum appeared  normal.      Normal mucosa was found in the entire colon. The Simple Endoscopic Score for Crohn's Disease was determined based on      the endoscopic appearance of the mucosa in the following segments:      - Ileum: Findings include no ulcers present, no ulcerated surfaces, no      affected surfaces and no narrowings. Segment score: 0.      - Right Colon: Findings include no ulcers present, no ulcerated      surfaces, no affected surfaces and no narrowings. Segment score: 0.      - Transverse Colon: Findings include no ulcers present, no ulcerated      surfaces, no affected surfaces and no narrowings. Segment score: 0.      - Left Colon: Findings include no ulcers present, no ulcerated surfaces,      no affected surfaces and no narrowings. Segment score: 0.      - Rectum: Findings include no ulcers present, no ulcerated surfaces, no      affected surfaces and no narrowings. Segment score: 0.      - Total SES-CD aggregate score: 0.                                                                                Impression:            - Preparation of the colon was fair.                        - Patent side-to-side ileo-colonic anastomosis,                        characterized by healthy appearing mucosa.                        - The examined portion of the ileum was normal.                        - Normal mucosa in the entire examined colon.                        - Simple Endoscopic Score for Crohn's Disease: 0,                        mucosal inflammatory changes secondary to Crohn's                        disease, in remission.                        - No specimens collected. Recommendation:        - Further recommendations can be made by the GI                        consult team. Please contact them if necessary.                        -  Return patient to hospital ward for ongoing care.                        - Resume previous diet.                        - Continue present medications.                        - Repeat colonoscopy (date not yet determined) to assess disease activity.                                                                                Procedure Code(s):     --- Professional ---                        [phone], Colonoscopy, flexible; diagnostic, including                        collection of specimen(s) by brushing or washing, when                        performed (separate procedure) Diagnosis Code(s):     --- Professional ---                        Z98.0, Intestinal bypass and anastomosis status                        K50.80, Crohn's disease of both small and large                        intestine without complications CPT copyright 2023 American Medical Association. All rights reserved. The codes documented in this report are preliminary and upon coder review may be revised to meet current compliance requirements. Electronically Signed  by Tonie Franks, MD __________________________ Patria Bookbinder, MD 05/16/2023 11:52:34 AM The attending physician was present throughout the entire procedure including the insertion, viewing, and removal of the endoscope. This procedure note has been electronically signed by: Tonie Franks , MD ___________________ Ala How, MD Number of Addenda: 0 Note Initiated On: 05/16/2023 11:05 AM    CT Abdomen Pelvis W IV Contrast Only  Result Date: 05/14/2023  EXAM: CT ABDOMEN PELVIS W CONTRAST ACCESSION: [id number] UN REPORT DATE: 05/14/2023 7:46 PM CLINICAL INDICATION: 53 years old with ?crohns flare, abdominal pain  COMPARISON: 01/21/2023 CT abdomen pelvis, chest radiograph 05/14/2023 TECHNIQUE: A helical CT scan of the abdomen and pelvis was obtained following IV contrast from the lung bases through the pubic symphysis. Images were reconstructed in the axial plane. Coronal and sagittal reformatted images were also provided for further evaluation. FINDINGS: LOWER CHEST: Unremarkable. LIVER: Normal liver contour. Hepatic steatosis. Focal fatty infiltration along the falciform ligament. Fatty sparing along the gallbladder fossa. Subcentimeter hypoattenuating lesion in the right hemiliver (2:39), too small for characterization. BILIARY: Gallbladder is grossly unremarkable. No pericholecystic stranding or wall thickening. No biliary ductal dilatation.  SPLEEN: Normal in size and contour. PANCREAS: Normal  pancreatic contour.  No focal lesions.  No ductal dilation. ADRENAL GLANDS: Normal appearance of the adrenal glands. KIDNEYS/URETERS: Symmetric renal enhancement.  No hydronephrosis.  No solid renal mass. Bilateral renal cyst, and other subcentimeter hypoattenuating lesions, too small for characterization. BLADDER: Unremarkable. REPRODUCTIVE ORGANS: Unremarkable. GI TRACT: Postsurgical changes of ileocecectomy with right lower quadrant anastomosis. The stomach is grossly unremarkable. Remainder of small bowel is normal course and caliber. The sigmoid rectal colon is decompressed with mild mural enhancement and engorgement of adjacent vasa recta. Bendix is absent. PERITONEUM, RETROPERITONEUM AND MESENTERY: No free air.  No ascites.  No fluid collection. LYMPH NODES: No adenopathy by size criteria. VESSELS: Hepatic and portal veins are patent.  Normal caliber aorta. Calcified atherosclerosis of the abdominal aorta and branching vessels. BONES and SOFT TISSUES: No aggressive osseous lesions.  No focal soft tissue lesions.     Possible early rectosigmoid colitis given mild mural enhancement and questionable engorgement of the vasa recta. Otherwise, no acute abnormality in the abdomen or pelvis.     XR Chest 2 views  Result Date: 05/14/2023  EXAM: XR CHEST 2 VIEWS ACCESSION: [id number] UN REPORT DATE: 05/14/2023 6:36 PM CLINICAL INDICATION: COUGH  TECHNIQUE: PA and Lateral Chest Radiographs COMPARISON: 03/29/2021 and prior FINDINGS: Lungs are clear.  No pleural effusion or pneumothorax. Normal heart size and mediastinal contours. Unchanged partially anterior plate and screw fixation hardware in the lower cervical spine from prior ACDF. Mild degenerative spine.     No acute cardiopulmonary disease.   ______________________________________________________________________  Discharge Instructions:   Activity Instructions       Activity as tolerated                       Follow Up instructions and Outpatient Referrals     Call MD for:  difficulty breathing, headache or visual disturbances      Call MD for:  persistent nausea or vomiting      Call MD for:  severe uncontrolled pain      Call MD for:  temperature >38.5 Celsius      Discharge instructions          Appointments which have been scheduled for you      Jun 20, 2023 10:50 AM  (Arrive by 10:35 AM)  NEW ORTHOPAEDICS with Rona Cobia, MD  [facility] [place] [facility]) [address]  [place] [address]  [phone]             ______________________________________________________________________  Discharge Day Services:  BP 139/83  - Pulse 57  - Temp 36.4 ??C (97.5 ??F) (Oral)  - Resp 18  - Ht 180.3 cm (5' 11)  - Wt 62.1 kg (137 lb)  - SpO2 100%  - BMI 19.11 kg/m??     Pt seen on the day of discharge and determined appropriate for discharge.    Condition at Discharge: good    Length of Discharge: I spent greater than 30 mins in the discharge of this patient.

## 2023-05-17 NOTE — Unmapped (Signed)
 Plan of care care reviewed with patient. Ambulating in room, Independent, no complaints.

## 2023-05-19 DIAGNOSIS — Z09 Encounter for follow-up examination after completed treatment for conditions other than malignant neoplasm: Principal | ICD-10-CM

## 2023-05-27 NOTE — Unmapped (Signed)
 Reason for Visit: Hospital Follow-up Medication Management    Assessment and Plan:       # Medication Management   Patient declined interview with clinical pharmacist   EKG due to multiple Qtc prolonging medications: buspirone, citalopram, cyclobenzaprine, gabapentin, hydroxyzine, ondansetron, promethazine      Recommendations and medication-related problems were discussed directly with the patient's transitions of care physician, Dr. Reva Castleman, immediately following the pharmacist visit prior to the physician visit. Questions/concerns were addressed to the patient's satisfaction. I spent a total of 10 minutes face to face with the patient delivering clinical care and providing education/counseling.      Ann Keto, PharmD CPP  Clinical Pharmacist    ___________________________________________________     History of Present Illness:  Rodney Bender is a 53 y.o. male with a past medical history of Crohn's disease, GERD, HTN, chronic pain, anxiety who was recently hospitalized from 05/14/23 to 05/17/23 for Chron's flare with diarrhea, hematochezia. Pt [facility] to the [facility] for follow-up without all of his medication bottles.     At discharge,   Stop meloxicam 7.5 mg   Start oxycodone-acetaminophen 10-325 mg 1 tab q8h PRN       Since discharge, patient reports he was not prescribed oxycodone-acetaminophen 10-325 mg at discharge. He states he takes all his medications PRN and then states he takes exactly as were given. He is very frustrated and refused further engagement.     Discrepancies noted in medication review: could not complete medication review today    Medication Adherence and Access:  Missed doses?:  declined to answer  Uses pillbox?:  declined to answer  Anyone else assist with medication organization? no  Current insurance coverage:   Preferred Pharmacy: [facility] [address]   Medications affordable?: yes  Needs refills? yes - everything Allergies:   Allergies   Allergen Reactions    Tramadol Itching and Anxiety     Other reaction(s): Other (See Comments)    Penicillins      Other reaction(s): Itching-Allergy    Dilaudid [Hydromorphone] Anxiety    Morphine Itching     High doses       Medications: Medications reviewed in EPIC medication station and updated today by the clinical pharmacist practitioner.  Current Outpatient Medications on File Prior to Visit   Medication Sig Dispense    busPIRone (BUSPAR) 10 MG tablet Take 2 tablets (20 mg total) by mouth Three (3) times a day.     citalopram (CELEXA) 40 MG tablet Take 1 tablet (40 mg total) by mouth daily.     cyclobenzaprine (FLEXERIL) 5 MG tablet      dicyclomine (BENTYL) 20 mg tablet      dronabinol (MARINOL) 10 MG capsule Take 1 capsule (10 mg total) by mouth Two (2) times a day (30 minutes before a meal).     ferrous sulfate 325 (65 FE) MG tablet TAKE 1 TABLET BY MOUTH EVERY OTHER DAY     gabapentin (NEURONTIN) 300 MG capsule TAKE 2 CAPSULES BY MOUTH THREE TIMES DAILY     hydrOXYzine (ATARAX) 25 MG tablet Take 1 tablet (25 mg total) by mouth Three (3) times a day as needed for anxiety.     loperamide (IMODIUM A-D) 2 mg tablet Take 1 tablet (2 mg total) by mouth daily as needed.     naloxone (NARCAN) 4 mg nasal spray      OLANZapine (ZYPREXA) 2.5 MG tablet Take 1 tablet by mouth nightly  ondansetron (ZOFRAN-ODT) 4 MG disintegrating tablet Dissolve 2 tablets (8 mg total) in the mouth every eight (8) hours as needed for nausea.     oxyCODONE-acetaminophen (PERCOCET) 10-325 mg per tablet Take 1 tablet by mouth every eight (8) hours as needed.     pantoprazole (PROTONIX) 40 MG tablet Take 1 tablet (40 mg total) by mouth Two (2) times a day (30 minutes before a meal).     promethazine (PHENERGAN) 25 MG tablet Take 2 tablets (50 mg total) by mouth every six (6) hours as needed for nausea.     therapeutic multivitamin (THERAGRAN) tablet Take 1 tablet by mouth daily.     ustekinumab (STELARA) 90 mg/mL Syrg syringe Inject the contents of 1 syringe (90 mg total) under the skin every 8 weeks.      No current facility-administered medications on file prior to visit.       Future Appointments   Date Time Provider Department Center   05/28/2023  2:30 PM [facility] HOSPITAL FOLLOW-UP [facility]   06/20/2023 10:50 AM Cliffton Dama, Jacki [place], MD [facility]

## 2023-05-28 ENCOUNTER — Ambulatory Visit: Admit: 2023-05-28 | Discharge: 2023-05-29 | Payer: MEDICARE | Attending: Internal Medicine | Primary: Internal Medicine

## 2023-05-28 DIAGNOSIS — E291 Testicular hypofunction: Principal | ICD-10-CM

## 2023-05-28 DIAGNOSIS — K08109 Complete loss of teeth, unspecified cause, unspecified class: Principal | ICD-10-CM

## 2023-05-28 DIAGNOSIS — K224 Dyskinesia of esophagus: Principal | ICD-10-CM

## 2023-05-28 DIAGNOSIS — R9431 Abnormal electrocardiogram [ECG] [EKG]: Principal | ICD-10-CM

## 2023-05-28 DIAGNOSIS — F1911 Other psychoactive substance abuse, in remission: Principal | ICD-10-CM

## 2023-05-28 DIAGNOSIS — F419 Anxiety disorder, unspecified: Principal | ICD-10-CM

## 2023-05-28 DIAGNOSIS — Z09 Encounter for follow-up examination after completed treatment for conditions other than malignant neoplasm: Principal | ICD-10-CM

## 2023-05-28 DIAGNOSIS — D500 Iron deficiency anemia secondary to blood loss (chronic): Principal | ICD-10-CM

## 2023-05-28 DIAGNOSIS — Z79899 Other long term (current) drug therapy: Principal | ICD-10-CM

## 2023-05-28 DIAGNOSIS — F17200 Nicotine dependence, unspecified, uncomplicated: Principal | ICD-10-CM

## 2023-05-28 DIAGNOSIS — R109 Unspecified abdominal pain: Principal | ICD-10-CM

## 2023-05-28 DIAGNOSIS — G8929 Other chronic pain: Principal | ICD-10-CM

## 2023-05-28 DIAGNOSIS — K50818 Crohn's disease of both small and large intestine with other complication: Principal | ICD-10-CM

## 2023-05-28 MED ORDER — DRONABINOL 10 MG CAPSULE
ORAL_CAPSULE | Freq: Two times a day (BID) | ORAL | 0 refills | 30.00000 days | Status: CP
Start: 2023-05-28 — End: ?

## 2023-05-28 MED ORDER — ONDANSETRON 4 MG DISINTEGRATING TABLET
ORAL_TABLET | Freq: Three times a day (TID) | 11 refills | 8.00000 days | Status: CP | PRN
Start: 2023-05-28 — End: ?

## 2023-05-28 MED ORDER — DICYCLOMINE 20 MG TABLET
ORAL_TABLET | Freq: Three times a day (TID) | ORAL | 0 refills | 10.00000 days | Status: CP | PRN
Start: 2023-05-28 — End: ?

## 2023-05-28 MED ORDER — CITALOPRAM 40 MG TABLET
ORAL_TABLET | Freq: Every day | ORAL | 0 refills | 30.00000 days | Status: CP
Start: 2023-05-28 — End: ?

## 2023-05-28 MED ORDER — GABAPENTIN 300 MG CAPSULE
ORAL_CAPSULE | Freq: Three times a day (TID) | ORAL | 11 refills | 30.00000 days | Status: CP
Start: 2023-05-28 — End: ?

## 2023-05-28 MED ORDER — BUSPIRONE 10 MG TABLET
ORAL_TABLET | Freq: Three times a day (TID) | ORAL | 2 refills | 30.00000 days | Status: CP
Start: 2023-05-28 — End: ?

## 2023-05-28 NOTE — Unmapped (Signed)
 HOSPITAL FOLLOW UP SOCIAL WORK ASSESSMENT:     Licensed Clinical Social Worker (SW) met face to face with patient during their hospital follow-up appointment on 05/28/2023 to assess for care management needs.    Is there someone else in the room? No.     Home Health [facility]) Care :   does not have home health services  Current Home Health Agency:  Current services:  n/a  New order needed?: no    Durable Medical Equipment (DME):   Patient denies use    DME Agency:n/a   Denies need for repair on existing equipment    denies need for additional equipment at this time.   New order needed?: no      Personal Care Service/Personal Aide [facility]):   does not have personal care services/personal aide support and denies need for support.    PCS Agency:   New order needed?: no      Specialist Follow-up:  Future Appointments   Date Time Provider Department Center   06/20/2023 10:50 AM Cliffton Dama, Jacki [place], MD [facility]         Housing and Aftercare Support:    Patient  is bouncing from staying with different family due to lack of housing   in [place].   He denies issues with home safety.    The patient receives support from his wife.   Wife is also disabled. Per pt they are on waiting list for [place] housing authority. CM advised pt he can apply to other county housing assistance if he is open to moving . CM provided pt with list of affording housing and flyer to Housing Hotline in [place]. Provided resources for pantry to AVS     Depression Screening:  PHQ-2 Score: 3  PHQ-9 Score: 8    negative for SI and HI.   Screening complete, no depression identified / no further action needed today   Pt scored on his PHQ-  PHQ less than 10 , plan to rescreen in a year.     Pt voices he has issue with sleeping , feels some of this is situational (housing and mouth pain due to need for dentures/inplants . Discussed options of relaxing and therapy. Pt is not interested at this time in therapy.     Social Determinants of Health Screening:  Pt recently had food stamps decreased and he is receiving $23 monthly.  Cm provided list of pantries in his area and provided food from food pantry.         Social Drivers of Health     Food Insecurity: Food Insecurity Present (04/03/2021)    Hunger Vital Sign     Worried About Running Out of Food in the Last Year: Often true     Ran Out of Food in the Last Year: Often true   Tobacco Use: Medium Risk (05/28/2023)    Patient History     Smoking Tobacco Use: Former     Smokeless Tobacco Use: Never     Passive Exposure: Not on file   Transportation Needs: No Transportation Needs (01/19/2020)    PRAPARE - Therapist, art (Medical): No     Lack of Transportation (Non-Medical): No   Alcohol Use: Not At Risk (05/28/2023)    Alcohol Use     How often do you have a drink containing alcohol?: Never     How many drinks containing alcohol do you have on a typical day when you  are drinking?: Not on file     How often do you have 5 or more drinks on one occasion?: Not on file   Housing: Unknown (04/07/2022)    Housing     Within the past 12 months, have you ever stayed: outside, in a car, in a tent, in an overnight shelter, or temporarily in someone else's home (i.e. couch-surfing)?: No     Are you worried about losing your housing?: Not on file   Physical Activity: Insufficiently Active (12/20/2017)    Exercise Vital Sign     Days of Exercise per Week: 3 days     Minutes of Exercise per Session: 30 min   Utilities: Not on file   Stress: Stress Concern Present (06/02/2019)    [employer] of Occupational Health - Occupational Stress Questionnaire     Feeling of Stress : To some extent   Interpersonal Safety: Not At Risk (05/14/2023)    Interpersonal Safety     Unsafe Where You Currently Live: No     Physically Hurt by Anyone: No     Abused by Anyone: No   Substance Use: Not on file (12/03/2022)   Intimate Partner Violence: Not At Risk (05/14/2023)    Humiliation, Afraid, Rape, and Kick questionnaire     Fear of Current or Ex-Partner: No     Emotionally Abused: No     Physically Abused: No     Sexually Abused: No   Social Connections: Moderately Isolated (06/02/2019)    Social Connection and Isolation Panel [NHANES]     Frequency of Communication with Friends and Family: More than three times a week     Frequency of Social Gatherings with Friends and Family: Never     Attends Religious Services: Never     Database administrator or Organizations: No     Attends Banker Meetings: Never     Marital Status: Married   Programmer, applications: Low Risk  (01/19/2020)    Overall Financial Resource Strain (CARDIA)     Difficulty of Paying Living Expenses: Not hard at all   Health Literacy: Low Risk  (08/13/2020)    Health Literacy     : Never   Internet Connectivity: Not on file       SW provided an intervention for the [place] [employer] and Housing SDOH domain. The intervention was Provided [employer] ([place] [employer])         Upcoming appointments:  Future Appointments   Date Time Provider Department Center   05/28/2023  2:30 PM [facility] HOSPITAL FOLLOW-UP [facility]   06/20/2023 10:50 AM Cliffton Dama Jacki [place], MD [facility]       Reviewed upcoming clinic appointment(s): Yes.  Plans to keep appointment(s): Yes.  Transportation assistance needed: No    ------------------------------------------------------------------------------------------------------------------------------------------   Visit discussed with Ann Keto, PharmD, CPP and Escher Reva Castleman , MD who will also see patient during their HFU visit.      Family, social and cultural characteristics were assessed during the visit and plan.     Patient was informed they can call the clinic at [phone] with any additional questions or concerns.

## 2023-05-28 NOTE — Unmapped (Addendum)
 VISIT SUMMARY:    During your visit, we discussed your ongoing symptoms related to Crohn's disease, including persistent watery stools, weight loss, chronic abdominal pain, and nausea. We reviewed your current medications and made some adjustments to help manage your symptoms more effectively.    YOUR PLAN:    [facility]: i have sent a referral this is the phone number [phone]  .  I will also refer you to Dr. Aurelio Blower and you will receive a separate mychart message for this information    -CROHN'S DISEASE: We will continue managing it with Stelara injections every eight weeks. Please ensure you receive your next dose as scheduled.    -DIARRHEA:  We have provided you containers for stool sample for bacterial analysis. If the results are negative, you can use loperamide to help manage the diarrhea. It's important to monitor your hydration status closely.    -CHRONIC ABDOMINAL PAIN:  We will continue your current medications (gabapentin, Marinol, pantoprazole, and dicyclomine) and have discontinued meloxicam. You may also consider stopping iron supplements to see if it helps with the pain or nausea    -NAUSEA AND VOMITING:  We suggest switching your iron supplements to every other day or stopping them entirely to see if it helps. Continue using ondansetron as needed for nausea.    -WEIGHT LOSS: You have lost 40 lbs in the past several months. We will monitor your weight and appetite closely. Adjusting your iron supplementation may help improve your appetite.    INSTRUCTIONS:    Please follow up with us  after you receive the results of your stool sample. Ensure you receive your next Stelara injection as scheduled. Monitor your hydration status and weight closely, and let us  know if you experience any new or worsening symptoms. I have refilled several medications for you today.        [employer] RESOURCE LIST    Behavioral Health (mental health, substance use)    [facility]  [place] Served: [place], Conservation officer, historic buildings, Scientist, physiological, [place], [place], [place], [place], [place], [place], [place], [place], [place], [place], [place], [place], [place], [place], [place], [place], [place], [place], [place], [place], [place], [place], [place], Annetta Barter  Crisis Line: [phone]  Website: www.[facility].com  Crisis Services, Referrals to Treatment and Counseling  Phone: [phone] (referrals, to schedule an appointment, etc.)  Crisis Line: [phone] (Free, 24/7, talk to a licence provider if you are in crisis)    24/7 Mobile Crisis:  [facility] Mobile Crisis: [phone]  ([address] [place], [place], [place], [place], [place], [place], [place], [place], [place])  DIRECTV: [phone] Authur Leg[place], [place], [place], [place], [place], [place], [place], [place], [place], [place])  [facility] Access to [employer] (Mobile Crisis Connection): [phone] ([address], [place], Scientist, physiological, [place], [place], [place], [place], [place], [place], [place], [place], [place], [place], [place], [place], [place], [place], [place], [place], [place], [place], [place], [place], [place], [place], [place], [place], [place], [place], [place])      [facility]  Mental Health Counseling and Substance Use Treatment (Intensive Outpatient Program)  [address] [place], [place] [address]  Phone: [phone]  Same-Day Access Hours:  Monday, Wednesday and Friday, 8am - 2pm  Crisis & Diversion Center:  Walk-In Crisis Hours: 7 days/week, 8am - 12am    [facility]   [facility] and [facility] have therapists on staff available to meet with patients. They offer sliding scale fees based on income. They accept people with insurance and without insurance.   [facility]  [address]  [place], [place] [address]  [phone]  [facility]  [address]  [place], [place] [address]  [phone]    [facility]  Residential alcohol, substance use, and mental health treatment. Financial Assistance Available.  [address]  [place], [place] [address]  [phone]   Please call [phone] for more information on admissions       [facility] Assistance:    Catholic Charities:  Phone: [phone]   https://www.catholiccharitiesraleigh.org/dcfp-programs/  Resources: Museum/gallery curator, Corporate investment banker, Case Management, Emergency Utility Assistance.  - [employer] offers emergency financial assistance for utility bills such as electric, gas, or water. Funds are limited and assistance is administered on a first come first served basis. If you need assistance, please call [phone] or email DCFP@ccharitiesdor .org   [address] [facility]):   [address], [place] [address]  [phone]    [facility]:  https://citygatedreamcenter.com/  https://citygatedreamcenter.com/programs/  Museum/gallery curator, Emergency Financial Assistance, Family Programs, and many more resources!     [address], [place], [place] [address]  [phone]    NI627  Call 211 to get connected to financial assistance agencies in your area or visit https://www.mckee-gibson.com/  If you need help finding assistance with housing, food, healthcare, utility payments, and more, we can help. 2-1-1 Referral Specialists are available 24 hours a day, every day by dialing 2-1-1 or [phone] from any phone. The call is free, confidential, and available in any language.      [facility] Department of Social Services  https://www.[place].com/dss/  [email or web address] -RecruitSuit.ca   [address] [place], [place] [address]  Phone [phone]   Emergency Assistance w/ utilities, Food Stamps, Medicaid, etc.   Crisis Intervention Program (CIP), Low Income Energy Assistance Program ([place]), Low Income Household Water Assistance Program (LIHWAP)      [facility] Agency   Address: [address], [place], [place] [address]   Phone: [phone]   Website: www.alamanceservices.org   Service(s) Offered: Housing services, self-sufficiency, congregate meal program, and individual development account program.     [employer] of [place]   Address: [address] [place], [place] [address]   Phone: [phone]   Email: info@alliedchurches .org   Website: www.alliedchurches.org   Service(s) Offered: Housing the homeless, feeding the hungry, Company secretary, job and education related services.     [facility]   Address: [address] [place], [place] [address]   Phone: [phone] or [phone]   Email: [email or web address]   Service(s) Offered: Family services and transient assistance; emergency food, fuel, clothing, limited furniture, utilities; budget counseling, general counseling; give a kid a coat; thrift store; Christmas food and toys. Utility assistance, food pantry, rental assistance, life sustaining medicine        [place] [employer] Food Assistance:    [facility] Department of Social Services  https://www.[place].com/dss/  askdss@[email or web address] -RecruitSuit.ca   [address] [place], [place] [address]  Phone [phone]   Food Stamps.    Southern  Family Empowerment (SAFE) Food Pantry:  [phone]    S.A.F.E. Food Ministry is open from 9am-12pm on Tuesdays and Saturdays except the first & fifth Saturday of every month.  ProtectionPoker.ch    [facility]:  https://citygatedreamcenter.com/  https://citygatedreamcenter.com/programs/  Museum/gallery curator, Emergency Financial Assistance, Family Programs, and many more resources!     [address], [place], [place] [address]  [phone]    Other Community Agencies/churches that provide food assistance:  [facility] [phone]  [facility] [employer] Health Solutions Agency [phone]   [employer] [phone] Dream Center [phone]   [facility] Meals on Wheels [phone]   [facility] (age 65+) [phone]   Allied Churches [phone]   [place] [phone]   [facility] Sacrament/Little Portions Food Pantry [phone]   First [address] of [place]op [phone]   DreamAlign [phone]   [facility] [phone] [place] of [place] [phone]   [facility] [employer] [phone]   [facility] [phone]   [facility] [phone] [place] of [place] [phone]   Morning [address] [place] [phone]   [facility] [phone]   [facility] [phone]    SAFE [phone]    [facility] [phone]    Darol Elizabeth [phone]   [facility] [phone]     [facility] Housing Resources:    Emergency Shelters:  YB638  Call 211 to get connected to emergency shelter in your area    [facility] [phone] (men with substance abuse issues)   Allied Churches: [phone] (night shelter for homeless women, men, families)     Domestic Violence/Family Violence/Abuse    [facility]  Address: [address] [place], [place] [address]  [phone]   The [facility] [place] provides one-stop services for victims of family violence and elder abuse. Under one roof, professionals from different disciplines work together to provide consolidated and coordinated safety, legal, and social aid to individuals and families in need.   At the [facility], victims can come to one location to access a wide range of supportive resources, such as talking with a victim advocate, getting assistance with filing a restraining order, planning for their safety, talking to a Hydrographic surveyor, meeting with a professional to discuss civil and criminal legal issues, and gaining information on how to access shelter and other community resources.   OFFICE HOURS: Monday - Friday, 8 A.M. - 5 P.M.  INTAKE HOURS: Monday - Friday, 8:30 A.M. - 4:30 P.M.  AFTERHOURS  Immediate assistance is available 24 hours 7 days a week:  Family Abuse Services Crisis Line  ([phone]  National Domestic Abuse Hotline  3398390355    Family Abuse Services: Serves victims of domestic violence and abuse. Offers services such as counseling, support groups, legal advocacy, safety planning, emergency shelter, and others.  https://www.familyabuseservices.org/   Address: [address], [place], [place] [address] (Located inside the [facility])  If you are in immediate danger, call 911.  To reach our 24-hour crisis line, call [phone].  To reach our main office, call [phone].  Si est?? en peligro inmediato, llame al 911.  Para comunicarse con nuestra l??nea de crisis las 24 horas, llame al [phone].  Para comunicarse con nuestra oficina principal, llame al [phone].    Sexual Assault/Abuse    [facility]: Serves adult and child survivors of sexual assault, abuse, and human trafficking. Offers crisis line, confidential counseling, medical and legal advocacy, support, and other services. 24-hour crisis line: (336) [phone] (Call 911 for immediate help in an emergency)  https://www.crossroadscares.org/   [address]  [place], [place] [address]  [phone]      Eviction & Landlord Mediation    Legal Aid of [place]  Housing Helpline: 709-770-1750  Assistance w/ issues w/: eviction, landlord refusing housing assistance, repairs/maintenance, public housing and section 8, and other landlord-tenant issues.    Affordable Housing /  Foreclosure Prevention / Assistance    [employer] [phone]    Kelly Services [phone]    NCHousingSearch.org  Search Tool for Affordable/low-income housing: http://www.davis-wright.info/  For help searching, please call [phone] (toll free) Monday-Friday, 9:00 a.m. - 8:00 p.m      HUD Affordable Housing Search  ClosetRepublicans.fi  You can use this map to find a privately owned apartment with reduced rents.  To apply: contact the apartment management office.     HUD Housing Counseling Service  The nationwide network of HUD participating housing counseling agencies have been helping consumers across [place] for more than 50 years by providing the answers they need to make informed housing decisions.  Use the search tool to find a local HUD participating housing counseling agency near you: https://hudgov-answers.https://www.warren.com/  or call toll-free 2070531262      HUD [facility] Office  (Serves all of [place])  Assistance with locating affordable housing, avoiding foreclosure, help to stay in your home, etc.  http://www.lowery.com/  Contact Us  by Phone: To reach our office, you may call us  at [phone]. You will be prompted to choose from the following selections:  Enter the extension of the person you are calling if you know their extension or press:  1 - If you know the last name of the person you are calling  3 - For Public Housing/Section 8 inquiries  4 - For our office of Copy and Development  5 - For our office of Fair Housing and Equal Opportunity  6 - For our office of Multifamily Asset Management  7 - For our office of Multifamily Production  8 - For our [employer] and Clinical biochemist office)  9 - For other options      Rental and Utility Assistance:    [employer] of Social Services  https://www.[place].com/dss/  [email or web address]@[place] -RecruitSuit.ca   [address] [place], [place] [address]  Phone [phone]   Emergency Assistance w/ utilities, Sales executive, Medicaid, etc.   Crisis Intervention Program (CIP), Low Income Energy Assistance Program ([place]), Low Income Household Water Assistance Program [place])    PP295  Call 211 to get connected to financial assistance agencies in your area or visit https://www.mckee-gibson.com/    [facility] Agency   Address: [address], [place], [place] [address]   Phone: ([phone]   Website: www.alamanceservices.org   Service(s) Offered: Housing services, self-sufficiency, congregate meal program, and individual development account program.     [employer] of [place]   Address: [address], [place], [place] [address]   Phone: [phone]   Email: info@alliedchurches .org   Website: www.alliedchurches.org   Service(s) Offered: Housing the homeless, feeding the hungry, Company secretary, job and education related services.     [facility]   Address: [address] [place], [place] [address]   Phone: [phone] or [phone]   Email: [email or web address] .[email or web address]   Service(s) Offered: Family services and transient assistance; emergency food, fuel, clothing, limited furniture, utilities; budget counseling, general counseling; give a kid a coat; thrift store; Christmas food and toys. Utility assistance, food pantry, rental assistance, life sustaining medicine

## 2023-05-28 NOTE — Unmapped (Unsigned)
 Internal Medicine Hospital Follow-Up Visit    ASSESSMENT / PLAN:  1. Hospital discharge follow-up    2. Anxiety    3. Chronic abdominal pain    4. Crohn's disease of both small and large intestine with other complication    5. Mixed, or nondependent drug abuse, in remission    6. Tobacco use disorder    7. Hypogonadism male    8. Iron deficiency anemia due to chronic blood loss    9. Polypharmacy    10. QT prolongation    11. Edentulous    12. Esophageal spasm        Rodney Bender is a 53 y.o. male with a PMHx of Crohn's on Stelara, GERD, Anxiety and HTN  who presented to [facility] with 1 month of nausea, vomiting, abdominal pain, and diarrhea with hematochezia.  Concern for IBD flare and GI consulted and now s/p scop without active disease.  Presents today for a hospital follow up.     Diarrhea   Hematochezia w/o Crohn's Flare  CT showing rectosigmoid colitis and elevated ESR . C.diff and GIPP (never collected 2/2 no stool). S/p colonoscopy 4/18 which actually showed no evidence of active colitis. Concern for ongoing infection  -Stelara (states he may be a bit behind or the current dose)  -GIPP and C diff ordered today, pt provided collection kit    Weight Loss  Approx 35lbs over the past 5 months? Pt reports due to poor appetite ? Dental component  -Suggested stop Fe to see if that impacts diet  -Discussed how loss of teeth has impacted eating and worsened weight loss. Referred for implants as this is having sig QOL effects and likely compounding his weight loss    Dental Extractions  Needs frontal top implants, didn't tolerate dentures  -Referred to implant specialist AND [place] [facility]     Cervicalgia and chronic abdominal pain  Functional Dyspepsia:    Follows with pain management at [facility] (Dr. Marica Shoals) per notes takes   -percocet 10-325mg   q8h prn   -Gabapentin 600 tid  -Marinol qac  -pantropazole  -bentyl 20  -stop mobic  -Zofran prn     Depression/Anxiety:   -buspirone 20mg  TID  -citalopram 40mg    -olanzapine 2.5mg  nightly  -propranolol ?   **Last EKG 2023, will check EKG today QTc 441    Hypogonad  -Testosterone gel    IDA  -Fe supplement daily -> change to every other day    SDOH  -Given food bag    Several meds refilled today       The patient's hospital stay has been complicated by the following clinically significant conditions requiring additional evaluation and treatment or having a significant effect of this patient's care: - Malnutrition POA requiring further investigation, treatment, or monitoring      Outpatient Provider Follow Up Issues:   -Continue current Crohn's regimen, follow up with GI. Colonoscopy 4/18 showed no evidence of active disease, unclear what was driving his abdominal pain and nausea.       Patient provided with an updated and reconciled medications list.    Follow Up: Return in about 2 months (around 07/28/2023) for Recheck.  Future Appointments   Date Time Provider Department Center   06/13/2023 11:40 AM Natha Bair, MD [facility]   06/20/2023 10:50 AM Rona Cobia, MD [facility]     Medication adherence and barriers to the treatment plan have been addressed. Opportunities to optimize healthy behaviors  have been discussed. Patient / caregiver voiced understanding.      CHIEF COMPLAINT/HISTORY OF PRESENT ILLNESS:   Hospital Follow-Up for Hospitalization Follow-up    History obtained from: Patient  Date of Hospitalization Discharge:  05/17/23     HPI:      Rodney Bender is a 53 year old male with Crohn's disease who presents with persistent watery stools and weight loss for hospital follow up.    He has six to seven watery stools per day, with the last occurrence just before the visit. He experiences lightheadedness and dizziness, with no improvement in appetite. His weight has decreased from 174 pounds in December to 140 pounds recently, with a noted weight of 137 pounds during a recent hospital stay.    He has chronic dyspepsia and abdominal pain, managed with gabapentin, Marinol before meals, pantoprazole, and Bentyl, which provide partial relief. He uses Zofran as needed for nausea. Vomiting occurs once or twice daily, typically after eating, with variable amounts. Occasional blood is present in his stool. He takes iron supplements daily but is considering reducing the frequency to every other day to see if it improves his symptoms.    His medication regimen includes Buspar, Celexa, olanzapine, and Neurontin. He received a Stelara injection in January and is due for another dose soon.    Pt is also very worried about loss of front teeth and inability to tolerate dentures. He believes this is a significant source of weight loss for him as there are very few foods he can tolerate.         MEDICATIONS AND ALLERGIES:   Reviewed and updated in Epic.  Medication and allergy review was completed by the pharmacist and discussed during this visit. Please see their note within this encounter.    SOCIAL/FAMILY HISTORY:  Social History:  Reviewed in Epic.  Biopsychosocial barriers to care were addressed by the social worker this visit.     REVIEW OF SYSTEMS:    All other systems reviewed are negative except as noted here or in HPI.    PHYSICAL EXAM  Vitals:    Vitals:    05/28/23 1356   BP: 122/76   Pulse: 65   Temp: 36.9 ??C (98.5 ??F)   SpO2: 96%       Exam:  General:  NAD  Lungs:  CTA B  Heart:  RRR  Ext:  No c/c/e  Abd: Generalized tenderness    Labs:  Reviewed from discharge.  See Epic labs.   Radiology: Reviewed radiology studies from discharge. See in Epic.   Reviewed: Discharge summary, Labs, Procedures, and Imaging (e.g. xrays, CT, etc); See results in Epic  Discussed care with: Social worker and Forensic psychologist by phone or other method (Encounter type: Patient Outreach):     Patient Outreach History (Since 05/15/2023)       GI Refill Coordination       Date Method of Outreach Associated Actions User Next Outreach    05/28/2023 10:25 AM Telephone  Marcella Serge 07/24/2023              Transition of Care       Date Method of Outreach Associated Actions User Next Outreach    05/20/2023 12:00 PM Patient Portal Message  Margot Sheng     05/19/2023  9:33 AM Telephone  Consuella Denis, RN                    Successful contact made  or 2 unsuccessful attempts within 2 business days    SUMMARY OF ASSOCIATED HOSPITALIZATION    Rodney Bender is a 53 y.o. male with a PMHx of Crohn's on Stelara, GERD, Anxiety and HTN  who presented to [facility] with 1 month of nausea, vomiting, abdominal pain, and diarrhea with hematochezia.  Concern for IBD flare and GI consulted and now s/p scop without active disease.  Stable and will plan on dc home with PCP and GI follow up.  Below are more details of his stay at [facility] according to problem:      Diarrhea - Hematochezia- with concern for Crohn's Flare  Patient continues to endorse diffuse abdominal pain and nausea w/o vomiting 4/17. Patient mentioned his home dronabinol does not control his nausea well. He had 2 loose bowel movements overnight. IV fluids ordered iso NPO and continued diarrhea. These symptoms are similar to his Crohn's flares in the past, though etiology is still unknown; Crohn's vs. Infectious vs. Other. Crohn's more likely given positive CT showing rectosigmoid colitis and elevated ESR but C.diff and GIPP still pending. Patient remains afebrile. GI consulted and underwent colonoscopy 4/18 which actually showed no evidence of active colitis. Infectious studies were ordered but never able to be collected due to lack of frequent bowel movements. His pain and nausea improved by time of discharge. Unclear etiology but he improved with bowel rest and anti-emetics.         Chronic Problems:  Cervicalgia and chronic abdominal pain: Follows with pain management at [facility] (Dr. Marica Shoals) per notes takes percocet 10-325mg   q8h as needed for pain, states he takes it pretty consistently. Will continue that inpatient.      Functional Dyspepsia: continue Zyprexa, pantropazole, and bentyl     Depression/Anxiety: Continue home buspirone (20mg  TID), citalopram (40mg  daily), olanzapine (2.5mg  nightly)     GERD: continue home Protonix (40mg  BID)     The patient's hospital stay has been complicated by the following clinically significant conditions requiring additional evaluation and treatment or having a significant effect of this patient's care: - Malnutrition POA requiring further investigation, treatment, or monitoring      Outpatient Provider Follow Up Issues:   -Continue current Crohn's regimen, follow up with GI. Colonoscopy 4/18 showed no evidence of active disease, unclear what was driving his abdominal pain and nausea. takes percocet 10-325mg   q8h as needed for pain, states he takes it pretty consistently. Will continue that inpatient.      Functional Dyspepsia: continue Zyprexa, pantropazole, and bentyl     Depression/Anxiety: Continue home buspirone (20mg  TID), citalopram (40mg  daily), olanzapine (2.5mg  nightly)     GERD: continue home Protonix (40mg  BID)     The patient's hospital stay has been complicated by the following clinically significant conditions requiring additional evaluation and treatment or having a significant effect of this patient's care: - Malnutrition POA requiring further investigation, treatment, or monitoring      Outpatient Provider Follow Up Issues:   -Continue current Crohn's regimen, follow up with GI. Colonoscopy 4/18 showed no evidence of active disease, unclear what was driving his abdominal pain and nausea.

## 2023-05-28 NOTE — Unmapped (Signed)
 [facility] Refill Coordination Note    Specialty Medication(s) to be Shipped:   Inflammatory Disorders: Stelara    Other medication(s) to be shipped: No additional medications requested for fill at this time     Rodney Bender, DOB: Apr 15, 1970  Phone: [phone] (home)       All above HIPAA information was verified with patient.     Was a Nurse, learning disability used for this call? No    Completed refill call assessment today to schedule patient's medication shipment from the [facility]  [phone]).  All relevant notes have been reviewed.     Specialty medication(s) and dose(s) confirmed: Regimen is correct and unchanged.   Changes to medications: Rodney Bender reports no changes at this time.  Changes to insurance: No  New side effects reported not previously addressed with a pharmacist or physician: None reported  Questions for the pharmacist: No    Confirmed patient received a Conservation officer, historic buildings and a Surveyor, mining with first shipment. The patient will receive a drug information handout for each medication shipped and additional FDA Medication Guides as required.       DISEASE/MEDICATION-SPECIFIC INFORMATION        For patients on injectable medications: Patient currently has 0 doses left.  Next injection is scheduled for 06/07/2023.    SPECIALTY MEDICATION ADHERENCE     Medication Adherence    Patient reported X missed doses in the last month: 0  Specialty Medication: STELARA 90 mg/mL  Patient is on additional specialty medications: No              Were doses missed due to medication being on hold? No      ustekinumab: STELARA 90 mg/mL Syrg syringe: 0 doses of medicine on hand       REFERRAL TO PHARMACIST     Referral to the pharmacist: Not needed      SHIPPING     Shipping address confirmed in Epic.     Cost and Payment: Patient has a $0 copay, payment information is not required.    Delivery Scheduled: Yes, Expected medication delivery date: 5.8.2025.     Medication will be delivered via Same Day Courier to the prescription address in Epic WAM.    Rodney Bender   [facility]  Specialty Technician

## 2023-05-28 NOTE — Unmapped (Signed)
 Two bags of food was given to patient.

## 2023-05-30 MED ORDER — OLANZAPINE 2.5 MG TABLET
ORAL_TABLET | Freq: Every evening | ORAL | 0 refills | 0.00000 days
Start: 2023-05-30 — End: ?

## 2023-06-05 MED FILL — STELARA 90 MG/ML SUBCUTANEOUS SYRINGE: SUBCUTANEOUS | 56 days supply | Qty: 1 | Fill #1

## 2023-06-06 MED ORDER — OLANZAPINE 2.5 MG TABLET
ORAL_TABLET | Freq: Every evening | ORAL | 0 refills | 30.00000 days | Status: CP
Start: 2023-06-06 — End: ?

## 2023-06-06 NOTE — Unmapped (Signed)
 Colonoscopy  Procedure #1     Procedure #2   [id number]  MRN   IBD  Endoscopist     Is the patient's health insurance [employer], [employer], [facility]), [employer]HC Med [place], [employer], or [employer]?     Urgent procedure     Are you pregnant?     Are you in the process of scheduling or awaiting results of a heart ultrasound, stress test, or catheterization to evaluate new or worsening chest pain, dizziness, or shortness of breath?     Do you take: Plavix (clopidogrel), Coumadin (warfarin), Lovenox (enoxaparin), Pradaxa (dabigatran), Effient (prasugrel), Xarelto (rivaroxaban), Eliquis (apixaban), Pletal (cilostazol), or Brilinta (ticagrelor)?          Did ordering provider indicate how long to hold this medication in the order comments?          Which of the above medications are you taking?          What is the name of the medical practice that manages this medication?          What is the name of the medical provider who manages this medication?     Do you have hemophilia, von Willebrand disease, or low platelets?     Do you have a pacemaker or implanted cardiac defibrillator?     Has a [place] [facility] provider specified the location(s)?     Which location(s) did the [facility] provider specify?          [facility]          [place]          [facility]     Do you see a liver specialist for chronic liver disease?     Is the procedure indication for variceal screening?     Is procedure indication for variceal banding (this does NOT include variceal screening)?     Have you had a heart attack, stroke or heart stent placement within the past 6 months?     Month of event     Year of event (ONLY ENTER LAST 2 DIGITS)        5  Height (feet)   11  Height (inches)   140  Weight (pounds)   19.5  BMI          Did the ordering provider specify a bowel prep?          What bowel prep was specified?     Do you have an ostomy (bag on your stomach that collects your stool)?          Is it an ileostomy?          Is it a colostomy? Patient doesn't know.     Do you have chronic kidney disease?     Do you have chronic constipation or have you had poor quality bowel preps for past colonoscopies?   TRUE  Do you have Crohn's disease or ulcerative colitis?     Have you had weight loss surgery?          When you walk around your house or grocery store, do you have to stop and rest due to shortness of breath, chest pain, or light-headedness?     Do you ever use supplemental oxygen?     Have you been hospitalized for cirrhosis of the liver or heart failure in the last 12 months?     Have you been treated for mouth or throat cancer with radiation or surgery?  Have you been told that it is difficult for doctors to insert a breathing tube in you during anesthesia?     Have you had a heart or lung transplant?          Are you on dialysis?     Do you have cirrhosis of the liver?     Do you have myasthenia gravis?     Is the patient a prisoner?   ################# ## ###################################################################################################################   MRN:  [id number]   Anticoag Review  No   Nurse Triage  No   GI clinic consult  No   Procedure(s):  Colonoscopy     0   Endoscopist:  IBD   Urgent:  No   Prep:  Miralax Prep                  --------------------------- --- ----------------------------------------------------------------------------------------------------------------------------------------------------------------------------   [facility] Locations:  [facility]     [facility]     [place]        Requested Locations:              ################# ## ###################################################################################################################

## 2023-06-13 ENCOUNTER — Ambulatory Visit: Admit: 2023-06-13 | Discharge: 2023-06-14 | Payer: MEDICARE

## 2023-06-13 DIAGNOSIS — K50811 Crohn's disease of both small and large intestine with rectal bleeding: Principal | ICD-10-CM

## 2023-06-13 MED ORDER — OLANZAPINE 2.5 MG TABLET
ORAL_TABLET | Freq: Every evening | ORAL | 0 refills | 30.00000 days
Start: 2023-06-13 — End: ?

## 2023-06-13 NOTE — Unmapped (Signed)
 Next steps to execute our plan:  Keep your appointment for the colonoscopy, we'll add an upper at the same time.  They should call you to schedule a CT enterography, and you can call them at [phone].  -------------------------------------------------------------------------------  Phone numbers you may need:    For follow-up appointments call the GI clinic schedulers at [phone].   For urgent follow-up appointments ask me on MyChart or ask my nurse, Ellison Ha, at [phone].    For scheduling GI procedures (Upper endoscopy, colonoscopy, esophageal manometry, pH/impedence test) call the the GI procedures schedulers at [phone].  For scheduling radiology exams (Barium tests, CT scans, MRIs) call Radiology at [phone].  For scheduling exams or procedures call interventional radiology (VIR) at [phone].  For scheduling lab draws (blood, stool, urine) call [place] lab at [phone].    For non-urgent clinical questions, use MyChart, or call my nurse, Ellison Ha, at [phone].  For urgent clinical questions call my nurse, Ellison Ha, at [phone].  For urgent questions after hours you can call [phone] and ask for the GI medicine fellow on call.  For emergencies call 911 and present to the emergency department.    ==============================================

## 2023-06-13 NOTE — Unmapped (Signed)
 [employer]  at [facility]  Faculty Established Follow-up Visit Note    REFERRING PROVIDER: Pcp, None Per Patient  [address] [place],  [place] [address]    PRIMARY CARE PROVIDER: Lenore Rafter, MD    Assessment and Plan:  Rodney Bender is a 53 y.o. 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    Crohn's disease of both small and large intestine with rectal bleeding   With history of functional overlay but more diarrhea, more anemia, on Stelara for a long time now with a history of relatively aggressive disease may be worth a contrasted scan and upper endoscopy to look for any evidence of disease not identified during the hospitalization. I wonder if there could be just an unrelated reason for dyspepsia worsening, he does avoid NSAIDs. Wonder a bit about Marinol contributing but it seems to helkp him keep his weight up, really struggles to eat, didn't do well with mirtazapine and I fear clots from estrogen-based appetite stimulant in IBD patients.   - CT Enterography Abdomen/Pelvis w contrast; Future  - Upper Endoscopy; Future    Patient Instructions   Next steps to execute our plan:  Keep your appointment for the colonoscopy, we'll add an upper at the same time.  They should call you to schedule a CT enterography, and you can call them at [phone].  -------------------------------------------------------------------------------  Phone numbers you may need:    For follow-up appointments call the GI clinic schedulers at [phone].   For urgent follow-up appointments ask me on MyChart or ask my nurse, Ellison Ha, at [phone].    For scheduling GI procedures (Upper endoscopy, colonoscopy, esophageal manometry, pH/impedence test) call the the GI procedures schedulers at [phone].  For scheduling radiology exams (Barium tests, CT scans, MRIs) call Radiology at [phone].  For scheduling exams or procedures call interventional radiology (VIR) at [phone].  For scheduling lab draws (blood, stool, urine) call [facility] lab at [phone].    For non-urgent clinical questions, use MyChart, or call my nurse, Ellison Ha, at [phone].  For urgent clinical questions call my nurse, Ellison Ha, at [phone].  For urgent questions after hours you can call [phone] and ask for the GI medicine fellow on call.  For emergencies call 911 and present to the emergency department.    ==============================================    Return in about 4 months (around 10/14/2023).    I personally spent 10 minutes face-to-face and non-face-to-face in the care of this patient, which includes all pre, intra, and post visit time on the date of service.  All documented time was specific to the E/M visit and does not include any procedures that may have been performed.     Waldemar Siegel C. Joycelyn Noa, MD MPH  Clinical Assistant Professor  Gastroenterology and Hepatology     -------------------------    Chief Complaint    Follow-up       History of Present Illness:      Rodney Bender is a 53 y.o. 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    Interval history:  He's feeling worse than baseline after hospitalization, primarily poor appetite, dyspepsia, and nausea. Weights are stable on our scale not he's a little more anemic. He has increased bowel frequency as well, from about 3-4 per day to  7-8 on some days. No blood in the stool.     Past Medical History:   Diagnosis Date    Acid reflux     Anxiety     Crohn's disease       diagnosed in 1990    GERD (gastroesophageal reflux disease)     Hypertension 10/08/2016     Past Surgical History:  Ileocolectomy with anastomosis and multiple IR drains about 5 cm ileum and colon (09/16/2016)  Ileocolectomy about 15 cm (2009)  Ileocecectomy unclear extent (1998)     Current Outpatient Medications on File Prior to Visit   Medication Sig Dispense Refill    busPIRone (BUSPAR) 10 MG tablet Take 2 tablets (20 mg total) by mouth Three (3) times a day. 180 tablet 2    citalopram (CELEXA) 40 MG tablet Take 1 tablet (40 mg total) by mouth daily. 30 tablet 0    cyclobenzaprine (FLEXERIL) 5 MG tablet       dicyclomine (BENTYL) 20 mg tablet Take 1 tablet (20 mg total) by mouth Three (3) times a day as needed. 30 tablet 0    dronabinol (MARINOL) 10 MG capsule Take 1 capsule (10 mg total) by mouth Two (2) times a day (30 minutes before a meal). 60 capsule 0    ferrous sulfate 325 (65 FE) MG tablet TAKE 1 TABLET BY MOUTH EVERY OTHER DAY 60 tablet 0    gabapentin (NEURONTIN) 300 MG capsule Take 2 capsules (600 mg total) by mouth Three (3) times a day. 180 capsule 11    [EXPIRED] hydrOXYzine (ATARAX) 25 MG tablet Take 1 tablet (25 mg total) by mouth Three (3) times a day as needed for anxiety. 90 tablet 3    loperamide (IMODIUM A-D) 2 mg tablet Take 1 tablet (2 mg total) by mouth daily as needed.      naloxone (NARCAN) 4 mg nasal spray       OLANZapine (ZYPREXA) 2.5 MG tablet Take 1 tablet by mouth nightly 30 tablet 0    ondansetron (ZOFRAN-ODT) 4 MG disintegrating tablet Dissolve 2 tablets (8 mg total) in the mouth every eight (8) hours as needed for nausea. 45 tablet 11    oxyCODONE-acetaminophen (PERCOCET) 10-325 mg per tablet Take 1 tablet by mouth every eight (8) hours as needed.      pantoprazole (PROTONIX) 40 MG tablet Take 1 tablet (40 mg total) by mouth Two (2) times a day (30 minutes before a meal). 180 tablet 3    promethazine (PHENERGAN) 25 MG tablet Take 2 tablets (50 mg total) by mouth every six (6) hours as needed for nausea. 45 tablet 11    therapeutic multivitamin (THERAGRAN) tablet Take 1 tablet by mouth daily. 90 tablet 3    ustekinumab (STELARA) 90 mg/mL Syrg syringe Inject the contents of 1 syringe (90 mg total) under the skin every 8 weeks. 1 mL 5     No current facility-administered medications on file prior to visit.     Allergies  Reviewed on 06/13/2023        Reactions Comments    Tramadol Itching, Anxiety Other reaction(s): Other (See Comments)    Penicillins  Other reaction(s): Itching-Allergy    Dilaudid [hydromorphone] Anxiety     Morphine Itching High doses            Family History   Problem Relation Age of Onset    Hyperlipidemia Father     Cancer Father         kidney  Cancer Maternal Aunt     Stroke Mother     No Known Problems Sister     No Known Problems Brother     No Known Problems Maternal Uncle     No Known Problems Paternal Aunt     No Known Problems Paternal Uncle     No Known Problems Maternal Grandmother     No Known Problems Maternal Grandfather     No Known Problems Paternal Grandmother     No Known Problems Paternal Grandfather     Anesthesia problems Neg Hx     Broken bones Neg Hx     Clotting disorder Neg Hx     Collagen disease Neg Hx     Diabetes Neg Hx     Dislocations Neg Hx     Fibromyalgia Neg Hx     Gout Neg Hx     Hemophilia Neg Hx     Osteoporosis Neg Hx     Rheumatologic disease Neg Hx     Scoliosis Neg Hx     Severe sprains Neg Hx     Sickle cell anemia Neg Hx     Spinal Compression Fracture Neg Hx      Social History     Tobacco Use    Smoking status: Former     Current packs/day: 0.00     Average packs/day: 1 pack/day for 18.0 years (18.0 ttl pk-yrs)     Types: Cigarettes     Start date: 08/27/2003     Quit date: 06/06/2017     Years since quitting: 6.0    Smokeless tobacco: Never    Tobacco comments:     Pt smokes 1ppd, Pt is interested in tobacco cessation    Vaping Use    Vaping status: Never Used   Substance Use Topics    Alcohol use: Not Currently    Drug use: Not Currently     Types: Marijuana     Review of Systems:  The balance of 12 systems reviewed is negative except as noted in the history of present illness.    Vital Signs: BP 118/71 (BP Site: L Arm, BP Position: Sitting, BP Cuff Size: Medium)  - Pulse 66  - Temp 36.7 ??C (98 ??F) (Temporal)  - Ht 180.3 cm (5' 11)  - Wt 64 kg (141 lb 3.2 oz)  - BMI 19.69 kg/m??     Wt Readings from Last 6 Encounters:   06/13/23 64 kg (141 lb 3.2 oz)   05/28/23 63.5 kg (140 lb)   05/16/23 62.1 kg (137 lb)   01/21/23 79 kg (174 lb 2.6 oz)   11/18/22 70.3 kg (155 lb)   09/20/22 70 kg (154 lb 6.4 oz)     Review of Systems:  General appearance: Appears well, no distress and cachectic.  Eyes: Anicteric sclera. No erythema.  ENT: No oral ulcers. MMM  Cardiovascular: RRR, WWP. No lower extremity edema.  Pulmonary: Normal work of breathing. Acyanotic.  Abdominal: soft, tender epigastrium, nondistended, no masses or organomegaly.  Musculoskeletal: No temporal wasting. Normal joints of the hand.  Skin: No jaundice. No rashes.  Neurologic: Alert, oriented, and appropriate.  Psychiatric: Appropriate.    Data Review:  Review of labs found:  Anemia a little worse at 10.4.,    Review of endoscopy found:  Colonoscopy - 05/16/2023 - no active disease, well appearing anastomosis, good prep  Colonoscopy - 10/18/21 - i0 at anastomosis, SES-CD 1  Upper endoscopy - 10/18/21 - nl  Colonoscopy -  05/15/2020 - i1 at anastomosis, SESCD 3, one 2 mm polyp  Colonoscopy - 02/12/2019 - i1 at anastomosis, no more proximal ileal inflammation  Colonoscopy - 04/06/2018 - i0 at anastomosis  Colonoscopy - 03/21/2017 - i1 at anastomosis, one 2mm polup  Colonoscopy - 08/09/2016 - occlusive ulcerated stricture at anastomosis   Upper endoscopy - 07/15/2014 - nl  Colonoscopy - 07/15/2014 - inflammed anastomosis dilated to 12-30mm, scattered apthae distal ileum  Colonoscopy - 07/21/2013 - i0 anastomosis, nl TI, hemorrhoids  Upper endoscopy - 07/21/2013 - nl  Upper endoscopy - 11/10/2012 - nl  Colonoscopy - 11/10/2012 - traversable anastomosis with i4 inflammation, and ulcerations in the TI  Note procedures on record back to 06/12/1998    Review of imaging found:  CT abdomen pelvis w IV contrast - 05/14/2023 - normal upper GI and small bowel  CT abdomen/pelvis w contrast - multiple air fluid levels in colon, nonspecific but c/w diarrheal illness  CT enterography - 04/07/2020 - scattered small bowel narrowing without adjacent inflammatory stranding  CT abdomen/pelvis w contrast - 02/08/2019 - Diffuse small bowel wall thickening is seen with surrounding free fluid suggestive of active inflammation  CT abdomen/pelvis w contrast - 12/20/2017 - Mucosal thickening and enhancement in the left hemiabdomen small bowel with associated engorged mesenteric vessels. Mucosal wall thickening in the sigmoid colon and rectum  CT abdomen/pelvis w IV and oral contrast - 03/12/2017 - Status post ileocecectomy, unremarkable  Note exams on record back to 06/16/1998  .  Review of other studies found:  None

## 2023-06-16 ENCOUNTER — Ambulatory Visit: Attending: Anesthesiology | Admitting: Anesthesiology

## 2023-06-16 ENCOUNTER — Encounter: Payer: Self-pay | Admitting: Anesthesiology

## 2023-06-16 DIAGNOSIS — M542 Cervicalgia: Secondary | ICD-10-CM

## 2023-06-16 DIAGNOSIS — R109 Unspecified abdominal pain: Secondary | ICD-10-CM | POA: Diagnosis not present

## 2023-06-16 DIAGNOSIS — K5090 Crohn's disease, unspecified, without complications: Secondary | ICD-10-CM

## 2023-06-16 DIAGNOSIS — G8929 Other chronic pain: Secondary | ICD-10-CM

## 2023-06-16 DIAGNOSIS — M5412 Radiculopathy, cervical region: Secondary | ICD-10-CM

## 2023-06-16 DIAGNOSIS — F1190 Opioid use, unspecified, uncomplicated: Secondary | ICD-10-CM

## 2023-06-16 DIAGNOSIS — Z79891 Long term (current) use of opiate analgesic: Secondary | ICD-10-CM

## 2023-06-16 DIAGNOSIS — M47816 Spondylosis without myelopathy or radiculopathy, lumbar region: Secondary | ICD-10-CM

## 2023-06-16 DIAGNOSIS — G894 Chronic pain syndrome: Secondary | ICD-10-CM | POA: Diagnosis not present

## 2023-06-16 MED ORDER — OXYCODONE-ACETAMINOPHEN 10-325 MG PO TABS: 1.0000 | ORAL_TABLET | Freq: Three times a day (TID) | ORAL | 0 refills | Status: AC | PRN

## 2023-06-16 MED ORDER — OXYCODONE-ACETAMINOPHEN 10-325 MG PO TABS: 1.0000 | ORAL_TABLET | Freq: Three times a day (TID) | ORAL | 0 refills | Status: DC | PRN

## 2023-06-16 NOTE — Progress Notes (Signed)
 Virtual Visit via Telephone Note  I connected with Benjamin Murillo on 06/16/23 at  1:20 PM EDT by telephone and verified that I am speaking with the correct person using two identifiers.  Location: Patient: Home Provider: Pain control center   I discussed the limitations, risks, security and privacy concerns of performing an evaluation and management service by telephone and the availability of in person appointments. I also discussed with the patient that there may be a patient responsible charge related to this service. The patient expressed understanding and agreed to proceed.   History of Present Illness: I was able to reach out to [facility] via telephone as he could not link for the video portion the conference.  He reports that he has been doing well since our last conversation 2 months ago.  No change in the quality distribution or style of pain is noted with either his abdominal pain neck or low back pain.  He still following along with gastroenterology for his Crohn's disease.  He is taking Percocet 10 mg tablets 3 times a day and these continue to be effective for him.  He generally gets about 75 to 80% relief lasting 6 hours or so before his recurrence of his same baseline pain.  They enable him to stay active functional and productive and sleep better at night.  No side effects are reported.  His neck pain has been stable with no new changes regarding upper extremity strength function.  Bowel and bladder functions been stable.  Otherwise he is doing well.  Review of systems: General: No fevers or chills Pulmonary: No shortness of breath or dyspnea Cardiac: No angina or palpitations or lightheadedness GI: No abdominal pain or constipation Psych: No depression    Observations/Objective:  Current Outpatient Medications:    [START ON 07/21/2023] oxyCODONE -acetaminophen  (PERCOCET) 10-325 MG tablet, Take 1 tablet by mouth every 8 (eight) hours as needed for pain., Disp: 90 tablet,  Rfl: 0   amlodipine-atorvastatin (CADUET) 10-10 MG tablet, Take 1 tablet by mouth daily. (Patient not taking: Reported on 04/22/2023), Disp: , Rfl:    citalopram (CELEXA) 10 MG tablet, Take 10 mg by mouth daily as needed., Disp: , Rfl:    dronabinol (MARINOL) 10 MG capsule, Take 10 mg by mouth 2 (two) times daily before a meal., Disp: , Rfl:    erythromycin  ophthalmic ointment, Place 1 Application into the left eye at bedtime., Disp: 3.5 g, Rfl: 0   famotidine  (PEPCID ) 20 MG tablet, Take 20 mg by mouth 2 (two) times daily., Disp: , Rfl:    gabapentin  (NEURONTIN ) 100 MG capsule, Take 1 capsule (100 mg total) by mouth at bedtime. (Patient not taking: Reported on 04/22/2023), Disp: 30 capsule, Rfl: 3   gabapentin  (NEURONTIN ) 300 MG capsule, Take 1 capsule (300 mg total) by mouth 3 (three) times daily., Disp: 90 capsule, Rfl: 3   hyoscyamine  (LEVSIN, ANASPAZ ) 0.125 MG tablet, Take 0.125 mg by mouth as needed. , Disp: , Rfl:    loperamide (IMODIUM A-D) 2 MG tablet, Take 2 mg by mouth as needed., Disp: , Rfl:    naloxone  (NARCAN ) nasal spray 4 mg/0.1 mL, For respiratory depression from opioids, Disp: 1 kit, Rfl: 2   [START ON 06/21/2023] oxyCODONE -acetaminophen  (PERCOCET) 10-325 MG tablet, Take 1 tablet by mouth every 8 (eight) hours as needed for pain., Disp: 90 tablet, Rfl: 0   predniSONE  (STERAPRED UNI-PAK 21 TAB) 10 MG (21) TBPK tablet, 6 tablets day one and day two, five tablets day three  and day four, and daily taper over 12days as directed (Patient not taking: Reported on 04/22/2023), Disp: 42 tablet, Rfl: 0   prochlorperazine (COMPAZINE) 5 MG tablet, Take 5 mg by mouth every 8 (eight) hours as needed., Disp: , Rfl:    STELARA 90 MG/ML SOSY injection, Inject 90 mg as directed., Disp: , Rfl:    Past Medical History:  Diagnosis Date   Cervical radiculitis 12/22/2019   Cervical radiculopathy    Chronic abdominal pain    Chronic neck pain    Cold    Crohn disease (HCC)    GERD (gastroesophageal  reflux disease)    no meds-2-3 times weekly   Hand fracture, left    Headache(784.0)    Hypertension    no meds    IBS (irritable bowel syndrome)    Shortness of breath    "sometimes laying down; sometimes w/activity" (10/13/2012)   Assessment and Plan:  1. Cervicalgia   2. Crohn's disease without complication, unspecified gastrointestinal tract location (HCC)   3. Chronic pain syndrome   4. Chronic, continuous use of opioids   5. Chronic abdominal pain   6. Facet arthritis of lumbar region   7. Chronic neck and back pain   8. Cervical radiculitis    Based on our conversation and upon review of the [place]  practitioner database information I think it is appropriate to refill his medicines for the next 2 months dated from May 24 and June 23.  He was last seen back in March in clinic and had a urine drug screen that was appropriate.  He takes Marinol for abdominal pain.  Additionally, I have encouraged him to continue with his stretching strengthening exercises for his neck and neck pain.  Strength is been stable.  Continue lower body workout for core strengthening and continue follow-up with GI doctors for his Crohn's disease.  Continue follow-up with primary care for baseline medical care with scheduled return to clinic in 2 months. Follow Up Instructions:    I discussed the assessment and treatment plan with the patient. The patient was provided an opportunity to ask questions and all were answered. The patient agreed with the plan and demonstrated an understanding of the instructions.   The patient was advised to call back or seek an in-person evaluation if the symptoms worsen or if the condition fails to improve as anticipated.  I provided 30 minutes of non-face-to-face time during this encounter.   Zula Hitch, MD

## 2023-06-17 ENCOUNTER — Inpatient Hospital Stay: Admit: 2023-06-17 | Discharge: 2023-06-17 | Payer: MEDICARE

## 2023-06-17 ENCOUNTER — Encounter: Admit: 2023-06-17 | Discharge: 2023-06-17 | Payer: MEDICARE

## 2023-06-17 MED ADMIN — lidocaine (PF) (XYLOCAINE-MPF) 20 mg/mL (2 %) injection: INTRAVENOUS | @ 17:00:00 | Stop: 2023-06-17

## 2023-06-17 MED ADMIN — ondansetron (ZOFRAN) injection: INTRAVENOUS | @ 17:00:00 | Stop: 2023-06-17

## 2023-06-17 MED ADMIN — Propofol (DIPRIVAN) injection: INTRAVENOUS | @ 17:00:00 | Stop: 2023-06-17

## 2023-06-17 NOTE — Unmapped (Signed)
 I spoke with patient, he just had good colonoscopy, we discussed upper endoscopy in clinic and I kept the colon on in error, ufortunately he has drank prep, for which I apologized, but is willing to do just an upper endoscopy,  which is for worsening of his chronic dyspepsia, poor appetite, chronic nausea, to exclude any upper GI Crohn's disease or a confounding etiology today, agrees as well to follow-up with the CT enterography

## 2023-06-26 NOTE — Unmapped (Addendum)
 Patient is requesting the following refill  Requested Prescriptions     Pending Prescriptions Disp Refills    OLANZapine (ZYPREXA) 2.5 MG tablet 30 tablet 0     Sig: Take 1 tablet (2.5 mg total) by mouth nightly.       Recent Visits  Date Type Provider Dept   06/13/23 Office Visit Natha Bair, MD [facility] [place]   05/28/23 Office Visit Howard-Williams, Hayes Lipps, MD [facility] [place]   09/20/22 Office Visit Natha Bair, MD [facility]   Showing recent visits within past 365 days and meeting all other requirements  Future Appointments  No visits were found meeting these conditions.  Showing future appointments within next 365 days and meeting all other requirements        Labs: Not applicable this refill      refills on hold.

## 2023-06-29 MED ORDER — GABAPENTIN 300 MG CAPSULE
ORAL_CAPSULE | Freq: Three times a day (TID) | ORAL | 11 refills | 30.00000 days
Start: 2023-06-29 — End: ?

## 2023-06-29 MED ORDER — DRONABINOL 10 MG CAPSULE
ORAL_CAPSULE | Freq: Two times a day (BID) | ORAL | 0 refills | 30.00000 days
Start: 2023-06-29 — End: ?

## 2023-06-30 MED ORDER — DRONABINOL 10 MG CAPSULE
ORAL_CAPSULE | Freq: Two times a day (BID) | ORAL | 0 refills | 30.00000 days
Start: 2023-06-30 — End: ?

## 2023-06-30 MED ORDER — GABAPENTIN 300 MG CAPSULE
ORAL_CAPSULE | Freq: Three times a day (TID) | ORAL | 11 refills | 30.00000 days
Start: 2023-06-30 — End: ?

## 2023-06-30 NOTE — Unmapped (Signed)
 Please send to PCP

## 2023-07-02 MED ORDER — OLANZAPINE 2.5 MG TABLET
ORAL_TABLET | Freq: Every evening | ORAL | 0 refills | 30.00000 days | Status: CP
Start: 2023-07-02 — End: ?

## 2023-07-03 DIAGNOSIS — K50012 Crohn's disease of small intestine with intestinal obstruction: Principal | ICD-10-CM

## 2023-07-03 DIAGNOSIS — Z980 Intestinal bypass and anastomosis status: Principal | ICD-10-CM

## 2023-07-03 MED ORDER — OLANZAPINE 2.5 MG TABLET
ORAL_TABLET | Freq: Every evening | ORAL | 3 refills | 90.00000 days | Status: CP
Start: 2023-07-03 — End: 2024-07-02

## 2023-07-24 NOTE — Unmapped (Signed)
 [facility] Refill Coordination Note    Specialty Medication(s) to be Shipped:   Inflammatory Disorders: Stelara     Other medication(s) to be shipped: No additional medications requested for fill at this time     Rodney Bender, DOB: 1970-09-16  Phone: [phone] (home)       All above HIPAA information was verified with patient.     Was a Nurse, learning disability used for this call? No    Completed refill call assessment today to schedule patient's medication shipment from the [facility]  [phone]).  All relevant notes have been reviewed.     Specialty medication(s) and dose(s) confirmed: Regimen is correct and unchanged.   Changes to medications: Rodney Bender reports no changes at this time.  Changes to insurance: No  New side effects reported not previously addressed with a pharmacist or physician: None reported  Questions for the pharmacist: No    Confirmed patient received a Conservation officer, historic buildings and a Surveyor, mining with first shipment. The patient will receive a drug information handout for each medication shipped and additional FDA Medication Guides as required.       DISEASE/MEDICATION-SPECIFIC INFORMATION        For patients on injectable medications: Patient currently has 0 doses left.  Next injection is scheduled for 7/5.    SPECIALTY MEDICATION ADHERENCE     Medication Adherence    Patient reported X missed doses in the last month: 0  Specialty Medication: STELARA  90 mg/mL  Patient is on additional specialty medications: No              Were doses missed due to medication being on hold? No    Stelara  90 mg/mL 0 doses of medicine on hand        REFERRAL TO PHARMACIST     Referral to the pharmacist: Not needed      [facility]     Shipping address confirmed in Epic.     Cost and Payment: Patient has a $0 copay, payment information is not required.    Delivery Scheduled: Yes, Expected medication delivery date: 07/29/23.     Medication will be delivered via Same Day Courier to the prescription address in Epic [place].    [name]   [facility]  Specialty Technician

## 2023-07-29 MED FILL — STELARA 90 MG/ML SUBCUTANEOUS SYRINGE: SUBCUTANEOUS | 56 days supply | Qty: 1 | Fill #2

## 2023-08-13 ENCOUNTER — Encounter: Payer: Self-pay | Admitting: Anesthesiology

## 2023-08-13 ENCOUNTER — Ambulatory Visit: Attending: Anesthesiology | Admitting: Anesthesiology

## 2023-08-13 DIAGNOSIS — K5090 Crohn's disease, unspecified, without complications: Secondary | ICD-10-CM

## 2023-08-13 DIAGNOSIS — R109 Unspecified abdominal pain: Secondary | ICD-10-CM

## 2023-08-13 DIAGNOSIS — F1190 Opioid use, unspecified, uncomplicated: Secondary | ICD-10-CM

## 2023-08-13 DIAGNOSIS — G894 Chronic pain syndrome: Secondary | ICD-10-CM | POA: Diagnosis not present

## 2023-08-13 DIAGNOSIS — M47816 Spondylosis without myelopathy or radiculopathy, lumbar region: Secondary | ICD-10-CM

## 2023-08-13 DIAGNOSIS — G8929 Other chronic pain: Secondary | ICD-10-CM

## 2023-08-13 DIAGNOSIS — M5412 Radiculopathy, cervical region: Secondary | ICD-10-CM

## 2023-08-13 DIAGNOSIS — M542 Cervicalgia: Secondary | ICD-10-CM

## 2023-08-13 DIAGNOSIS — M549 Dorsalgia, unspecified: Secondary | ICD-10-CM

## 2023-08-13 DIAGNOSIS — Z79891 Long term (current) use of opiate analgesic: Secondary | ICD-10-CM

## 2023-08-13 MED ORDER — OXYCODONE-ACETAMINOPHEN 10-325 MG PO TABS: 1.0000 | ORAL_TABLET | Freq: Three times a day (TID) | ORAL | 0 refills | Status: AC | PRN

## 2023-08-13 MED ORDER — OXYCODONE-ACETAMINOPHEN 10-325 MG PO TABS: 1.0000 | ORAL_TABLET | Freq: Three times a day (TID) | ORAL | 0 refills | Status: DC | PRN

## 2023-08-13 NOTE — Progress Notes (Signed)
 Virtual Visit via Telephone Note  I connected with Benjamin Murillo on 08/13/23 at  3:40 PM EDT by telephone and verified that I am speaking with the correct person using two identifiers.  Location: Patient: Home Provider: Pain control center   I discussed the limitations, risks, security and privacy concerns of performing an evaluation and management service by telephone and the availability of in person appointments. I also discussed with the patient that there may be a patient responsible charge related to this service. The patient expressed understanding and agreed to proceed.   History of Present Illness: I spoke with Benjamin Murillo via telephone as we could not link for the video portion of the conference.  He reports that his abdominal pain neck pain and back pain have been stable with no recent changes reported.  He has had slightly better pain relief with his abdominal pain.  This is since our last evaluation.  He continues to follow along with his gastroenterologic docs.  He is doing his stretching strengthening exercises.  He takes his Percocet 3 times a day and has been on this chronically for diffuse body pain and this is working well for him.  He keeps his pain under reasonable control and he rates about a 50 to 75% relief with the above symptoms for about 4 to 6 hours.  He is still taking Marinol to help with his abdominal pain as well.  No side effects with the medications are reported and the quality characteristic and distribution is stable in nature.  He is very pleased with his response to therapy and maintains that he would be incapacitated without the medications.  Review of systems: General: No fevers or chills Pulmonary: No shortness of breath or dyspnea Cardiac: No angina or palpitations or lightheadedness GI: No abdominal pain or constipation Psych: No depression    Observations/Objective:  Current Outpatient Medications:    [START ON 09/19/2023]  oxyCODONE -acetaminophen  (PERCOCET) 10-325 MG tablet, Take 1 tablet by mouth every 8 (eight) hours as needed for pain., Disp: 90 tablet, Rfl: 0   amlodipine-atorvastatin (CADUET) 10-10 MG tablet, Take 1 tablet by mouth daily. (Patient not taking: Reported on 04/22/2023), Disp: , Rfl:    citalopram (CELEXA) 10 MG tablet, Take 10 mg by mouth daily as needed., Disp: , Rfl:    dronabinol (MARINOL) 10 MG capsule, Take 10 mg by mouth 2 (two) times daily before a meal., Disp: , Rfl:    erythromycin  ophthalmic ointment, Place 1 Application into the left eye at bedtime., Disp: 3.5 g, Rfl: 0   famotidine  (PEPCID ) 20 MG tablet, Take 20 mg by mouth 2 (two) times daily., Disp: , Rfl:    gabapentin  (NEURONTIN ) 100 MG capsule, Take 1 capsule (100 mg total) by mouth at bedtime. (Patient not taking: Reported on 04/22/2023), Disp: 30 capsule, Rfl: 3   gabapentin  (NEURONTIN ) 300 MG capsule, Take 1 capsule (300 mg total) by mouth 3 (three) times daily., Disp: 90 capsule, Rfl: 3   hyoscyamine  (LEVSIN , ANASPAZ ) 0.125 MG tablet, Take 0.125 mg by mouth as needed. , Disp: , Rfl:    loperamide (IMODIUM A-D) 2 MG tablet, Take 2 mg by mouth as needed., Disp: , Rfl:    naloxone  (NARCAN ) nasal spray 4 mg/0.1 mL, For respiratory depression from opioids, Disp: 1 kit, Rfl: 2   [START ON 08/20/2023] oxyCODONE -acetaminophen  (PERCOCET) 10-325 MG tablet, Take 1 tablet by mouth every 8 (eight) hours as needed for pain., Disp: 90 tablet, Rfl: 0   predniSONE  (STERAPRED UNI-PAK  21 TAB) 10 MG (21) TBPK tablet, 6 tablets day one and day two, five tablets day three and day four, and daily taper over 12days as directed (Patient not taking: Reported on 04/22/2023), Disp: 42 tablet, Rfl: 0   prochlorperazine (COMPAZINE) 5 MG tablet, Take 5 mg by mouth every 8 (eight) hours as needed., Disp: , Rfl:    STELARA 90 MG/ML SOSY injection, Inject 90 mg as directed., Disp: , Rfl:    Past Medical History:  Diagnosis Date   Cervical radiculitis 12/22/2019    Cervical radiculopathy    Chronic abdominal pain    Chronic neck pain    Cold    Crohn disease (HCC)    GERD (gastroesophageal reflux disease)    no meds-2-3 times weekly   Hand fracture, left    Headache(784.0)    Hypertension    no meds    IBS (irritable bowel syndrome)    Shortness of breath    sometimes laying down; sometimes w/activity (10/13/2012)   Assessment and Plan:  1. Cervicalgia   2. Crohn's disease without complication, unspecified gastrointestinal tract location (HCC)   3. Chronic pain syndrome   4. Chronic, continuous use of opioids   5. Chronic abdominal pain   6. Facet arthritis of lumbar region   7. Chronic neck and back pain   8. Cervical radiculitis    Based on our conversation and after review of the [place]  practitioner database information think is appropriate to refill his medicines for July 23 and August 22.  No other changes in his regimen will be initiated.  Continue with stretching strengthening exercises for core strength for his neck and his low back.  Continue follow-up with his GI doctors for his abdominal pain.  Will keep him on his current regimen which seems to be giving him good relief whereas he has failed more conservative therapy.  Will schedule him for follow-up in 2 months and continue with his primary care physicians for baseline medical care. Follow Up Instructions:    I discussed the assessment and treatment plan with the patient. The patient was provided an opportunity to ask questions and all were answered. The patient agreed with the plan and demonstrated an understanding of the instructions.   The patient was advised to call back or seek an in-person evaluation if the symptoms worsen or if the condition fails to improve as anticipated.  I provided 30 minutes of non-face-to-face time during this encounter.   Lynwood KANDICE Clause, MD

## 2023-08-29 ENCOUNTER — Ambulatory Visit: Admit: 2023-08-29 | Payer: MEDICARE | Attending: Emergency Medicine | Primary: Emergency Medicine

## 2023-09-10 DIAGNOSIS — F419 Anxiety disorder, unspecified: Principal | ICD-10-CM

## 2023-09-10 MED ORDER — CITALOPRAM 40 MG TABLET
ORAL_TABLET | Freq: Every day | ORAL | 0 refills | 30.00000 days
Start: 2023-09-10 — End: ?

## 2023-09-11 MED ORDER — CITALOPRAM 40 MG TABLET
ORAL_TABLET | Freq: Every day | ORAL | 0 refills | 30.00000 days | Status: CP
Start: 2023-09-11 — End: ?

## 2023-09-14 DIAGNOSIS — K224 Dyskinesia of esophagus: Principal | ICD-10-CM

## 2023-09-14 DIAGNOSIS — K219 Gastro-esophageal reflux disease without esophagitis: Principal | ICD-10-CM

## 2023-09-14 MED ORDER — PANTOPRAZOLE 40 MG TABLET,DELAYED RELEASE
ORAL_TABLET | 0 refills | 0.00000 days
Start: 2023-09-14 — End: ?

## 2023-09-16 MED ORDER — PANTOPRAZOLE 40 MG TABLET,DELAYED RELEASE
ORAL_TABLET | ORAL | 0 refills | 0.00000 days | Status: CP
Start: 2023-09-16 — End: ?

## 2023-09-16 NOTE — Unmapped (Signed)
 Patient is requesting the following refill  Requested Prescriptions     Pending Prescriptions Disp Refills    pantoprazole  (PROTONIX ) 40 MG tablet [Pharmacy Med Name: Pantoprazole  Sodium 40 MG Oral Tablet Delayed Release] 180 tablet 0     Sig: TAKE 1 TABLET BY MOUTH TWICE DAILY 30 MINUTES BEFORE MEALS       Recent Visits  Date Type Provider Dept   06/13/23 Office Visit Filbert Rodgers BROCKS, MD [facility] [place]   05/28/23 Office Visit Howard-Williams, Jacinta Maxwell, MD [facility] [place]   09/20/22 Office Visit Filbert Rodgers BROCKS, MD [facility]   Showing recent visits within past 365 days and meeting all other requirements  Future Appointments  No visits were found meeting these conditions.  Showing future appointments within next 365 days and meeting all other requirements    Labs: Not applicable this refill      refills authorized x  3 months

## 2023-09-17 NOTE — Unmapped (Signed)
 [facility] and Home Delivery Pharm[facility] Refill Coordination Note    Specialty Medication(s) to be Shipped:   Inflammatory Disorders: Stelara     Other medication(s) to be shipped: No additional medications requested for fill at this time    Specialty Medications not needed at this time: N/A     Rodney Bender, DOB: [birth year]  Phone: [phone] (home) [phone] (work)      All above HIPAA information was verified with patient.     Was a Nurse, learning disability used for this call? No    Completed refill call assessment today to schedule patient's medication shipment from the [facility]  [phone]).  All relevant notes have been reviewed.     Specialty medication(s) and dose(s) confirmed: Regimen is correct and unchanged.   Changes to medications: Rodney Bender reports no changes at this time.  Changes to insurance: No  New side effects reported not previously addressed with a pharmacist or physician: None reported  Questions for the pharmacist: No    Confirmed patient received a Conservation officer, historic buildings and a Surveyor, mining with first shipment. The patient will receive a drug information handout for each medication shipped and additional FDA Medication Guides as required.       DISEASE/MEDICATION-SPECIFIC INFORMATION        For patients on injectable medications: Next injection is scheduled for 8/30.    SPECIALTY MEDICATION ADHERENCE     Medication Adherence    Patient reported X missed doses in the last month: 0  Specialty Medication: STELARA  90 mg/mL  Patient is on additional specialty medications: No              Were doses missed due to medication being on hold? No    Stelara  90 mg/mL 0 doses of medicine on hand      SHIPPING     Shipping address confirmed in Epic.     Cost and Payment: Patient has a $0 copay, payment information is not required.    Delivery Scheduled: Yes, Expected medication delivery date: 09/19/23.     Medication will be delivered via Same Day Courier to the prescription address in Epic [place].    [name]   [facility] Delivery Pharm[facility]  Specialty Technician

## 2023-09-19 MED FILL — STELARA 90 MG/ML SUBCUTANEOUS SYRINGE: SUBCUTANEOUS | 56 days supply | Qty: 1 | Fill #3

## 2023-10-08 ENCOUNTER — Encounter: Payer: Self-pay | Admitting: Anesthesiology

## 2023-10-08 ENCOUNTER — Ambulatory Visit: Attending: Anesthesiology | Admitting: Anesthesiology

## 2023-10-08 DIAGNOSIS — R109 Unspecified abdominal pain: Secondary | ICD-10-CM

## 2023-10-08 DIAGNOSIS — M542 Cervicalgia: Secondary | ICD-10-CM | POA: Diagnosis not present

## 2023-10-08 DIAGNOSIS — G894 Chronic pain syndrome: Secondary | ICD-10-CM | POA: Diagnosis not present

## 2023-10-08 DIAGNOSIS — M47816 Spondylosis without myelopathy or radiculopathy, lumbar region: Secondary | ICD-10-CM | POA: Diagnosis not present

## 2023-10-08 DIAGNOSIS — F1190 Opioid use, unspecified, uncomplicated: Secondary | ICD-10-CM

## 2023-10-08 DIAGNOSIS — G8929 Other chronic pain: Secondary | ICD-10-CM

## 2023-10-08 DIAGNOSIS — Z79891 Long term (current) use of opiate analgesic: Secondary | ICD-10-CM

## 2023-10-08 DIAGNOSIS — M5412 Radiculopathy, cervical region: Secondary | ICD-10-CM

## 2023-10-08 DIAGNOSIS — K5090 Crohn's disease, unspecified, without complications: Secondary | ICD-10-CM

## 2023-10-08 MED ORDER — OXYCODONE-ACETAMINOPHEN 10-325 MG PO TABS: 1.0000 | ORAL_TABLET | Freq: Three times a day (TID) | ORAL | 0 refills | Status: AC | PRN

## 2023-10-08 NOTE — Progress Notes (Signed)
 Virtual Visit via Telephone Note  I connected with Benjamin Murillo on 10/08/23 at  1:00 PM EDT by telephone and verified that I am speaking with the correct person using two identifiers.  Location: Patient: Home Provider: Pain control center   I discussed the limitations, risks, security and privacy concerns of performing an evaluation and management service by telephone and the availability of in person appointments. I also discussed with the patient that there may be a patient responsible charge related to this service. The patient expressed understanding and agreed to proceed.   History of Present Illness: I was able to reach out to [facility] via telephone as we are unable to link for the video portion of this conference.  He reports that he is doing well with his current chronic opioid therapy.  He takes his medications approximately 3 times a day on average and generally gets 75% relief of his low back neck and chronic abdominal pain with the chronic opioid therapy.  He has failed more conservative therapy and maintains that he is stable with this regimen and functional.  Current dosing enables him to stay active during the day and get out about well for feeling daily household activities and daily chores.  He is able to sleep better at night.  No side effects with the medication are noted.  He continues to follow-up with his GI doctors for his Crohn's disease and chronic abdominal pain.  This is all well-managed with the current opioid regimen.  No side effects are reported.  No change in the quality characteristic or distribution of pain is noted.  Review of systems: General: No fevers or chills Pulmonary: No shortness of breath or dyspnea Cardiac: No angina or palpitations or lightheadedness GI: No abdominal pain or constipation Psych: No depression    Observations/Objective:  Current Outpatient Medications:    [START ON 11/18/2023] oxyCODONE -acetaminophen  (PERCOCET) 10-325 MG  tablet, Take 1 tablet by mouth every 8 (eight) hours as needed for pain., Disp: 90 tablet, Rfl: 0   amlodipine-atorvastatin (CADUET) 10-10 MG tablet, Take 1 tablet by mouth daily. (Patient not taking: Reported on 04/22/2023), Disp: , Rfl:    citalopram (CELEXA) 10 MG tablet, Take 10 mg by mouth daily as needed., Disp: , Rfl:    dronabinol (MARINOL) 10 MG capsule, Take 10 mg by mouth 2 (two) times daily before a meal., Disp: , Rfl:    erythromycin  ophthalmic ointment, Place 1 Application into the left eye at bedtime., Disp: 3.5 g, Rfl: 0   famotidine  (PEPCID ) 20 MG tablet, Take 20 mg by mouth 2 (two) times daily., Disp: , Rfl:    gabapentin  (NEURONTIN ) 100 MG capsule, Take 1 capsule (100 mg total) by mouth at bedtime. (Patient not taking: Reported on 04/22/2023), Disp: 30 capsule, Rfl: 3   gabapentin  (NEURONTIN ) 300 MG capsule, Take 1 capsule (300 mg total) by mouth 3 (three) times daily., Disp: 90 capsule, Rfl: 3   hyoscyamine  (LEVSIN , ANASPAZ ) 0.125 MG tablet, Take 0.125 mg by mouth as needed. , Disp: , Rfl:    loperamide (IMODIUM A-D) 2 MG tablet, Take 2 mg by mouth as needed., Disp: , Rfl:    naloxone  (NARCAN ) nasal spray 4 mg/0.1 mL, For respiratory depression from opioids, Disp: 1 kit, Rfl: 2   [START ON 10/19/2023] oxyCODONE -acetaminophen  (PERCOCET) 10-325 MG tablet, Take 1 tablet by mouth every 8 (eight) hours as needed for pain., Disp: 90 tablet, Rfl: 0   predniSONE  (STERAPRED UNI-PAK 21 TAB) 10 MG (21) TBPK tablet,  6 tablets day one and day two, five tablets day three and day four, and daily taper over 12days as directed (Patient not taking: Reported on 04/22/2023), Disp: 42 tablet, Rfl: 0   prochlorperazine (COMPAZINE) 5 MG tablet, Take 5 mg by mouth every 8 (eight) hours as needed., Disp: , Rfl:    STELARA 90 MG/ML SOSY injection, Inject 90 mg as directed., Disp: , Rfl:    Past Medical History:  Diagnosis Date   Cervical radiculitis 12/22/2019   Cervical radiculopathy    Chronic abdominal  pain    Chronic neck pain    Cold    Crohn disease (HCC)    GERD (gastroesophageal reflux disease)    no meds-2-3 times weekly   Hand fracture, left    Headache(784.0)    Hypertension    no meds    IBS (irritable bowel syndrome)    Shortness of breath    sometimes laying down; sometimes w/activity (10/13/2012)   Assessment and Plan:  1. Cervicalgia   2. Crohn's disease without complication, unspecified gastrointestinal tract location (HCC)   3. Chronic pain syndrome   4. Facet arthritis of lumbar region   5. Chronic abdominal pain   6. Chronic, continuous use of opioids   7. Chronic neck and back pain   8. Cervical radiculitis    I have reviewed the [place]  practitioner database information it is appropriate for refill for September 21 and October 21.  No other changes in his regimen will be initiated.  Continue with core stretching strengthening exercises as reviewed for his low back and neck and continue follow-up with his GI doctors for his chronic Crohn's disease.  Continue follow-up with his primary care physician for baseline medical care with scheduled return in 2 months. Follow Up Instructions:    I discussed the assessment and treatment plan with the patient. The patient was provided an opportunity to ask questions and all were answered. The patient agreed with the plan and demonstrated an understanding of the instructions.   The patient was advised to call back or seek an in-person evaluation if the symptoms worsen or if the condition fails to improve as anticipated.  I provided 30 minutes of non-face-to-face time during this encounter.   Lynwood KANDICE Clause, MD

## 2023-11-06 NOTE — Unmapped (Signed)
 [facility] Specialty and Home Delivery Pharmacy Refill Coordination Note    Specialty Medication(s) to be Shipped:   Inflammatory Disorders: Stelara     Other medication(s) to be shipped: No additional medications requested for fill at this time    Specialty Medications not needed at this time: N/A     Rodney Bender, DOB: Jun 07, 1970  Phone: [phone] (home) [phone] (work)      All above HIPAA information was verified with patient.     Was a Nurse, learning disability used for this call? No    Completed refill call assessment today to schedule patient's medication shipment from the [facility]  [phone]).  All relevant notes have been reviewed.     Specialty medication(s) and dose(s) confirmed: Regimen is correct and unchanged.   Changes to medications: Roshad reports no changes at this time.  Changes to insurance: No  New side effects reported not previously addressed with a pharmacist or physician: None reported  Questions for the pharmacist: No    Confirmed patient received a Conservation officer, historic buildings and a Surveyor, mining with first shipment. The patient will receive a drug information handout for each medication shipped and additional FDA Medication Guides as required.       DISEASE/MEDICATION-SPECIFIC INFORMATION        For patients on injectable medications: Next injection is scheduled for 10/18.    SPECIALTY MEDICATION ADHERENCE     Medication Adherence    Patient reported X missed doses in the last month: 0  Specialty Medication: STELARA  90 mg/mL  Patient is on additional specialty medications: No              Were doses missed due to medication being on hold? No    Stelara  90 mg/ml: 0 doses of medicine on hand        REFERRAL TO PHARMACIST     Referral to the pharmacist: Not needed      SHIPPING     Shipping address confirmed in Epic.     Cost and Payment: Patient has a $0 copay, payment information is not required.    Delivery Scheduled: Yes, Expected medication delivery date: 11/11/23.     Medication will be delivered via Same Day Courier to the prescription address in Epic [place].    [name]   [facility] Special[facility] Home Delivery Pharm[facility]  Specialty Technician

## 2023-11-11 MED FILL — STELARA 90 MG/ML SUBCUTANEOUS SYRINGE: SUBCUTANEOUS | 56 days supply | Qty: 1 | Fill #4

## 2023-11-22 DIAGNOSIS — F419 Anxiety disorder, unspecified: Principal | ICD-10-CM

## 2023-11-22 MED ORDER — CITALOPRAM 40 MG TABLET
ORAL_TABLET | Freq: Every day | ORAL | 0 refills | 30.00000 days
Start: 2023-11-22 — End: ?

## 2023-11-25 MED ORDER — CITALOPRAM 40 MG TABLET
ORAL_TABLET | Freq: Every day | ORAL | 0 refills | 30.00000 days | Status: CP
Start: 2023-11-25 — End: ?

## 2023-12-08 ENCOUNTER — Ambulatory Visit: Attending: Anesthesiology | Admitting: Anesthesiology

## 2023-12-08 ENCOUNTER — Encounter: Payer: Self-pay | Admitting: Anesthesiology

## 2023-12-08 VITALS — BP 168/101 | HR 76 | Temp 98.1°F | Resp 18 | Ht 71.0 in | Wt 145.0 lb

## 2023-12-08 DIAGNOSIS — F1190 Opioid use, unspecified, uncomplicated: Secondary | ICD-10-CM | POA: Insufficient documentation

## 2023-12-08 DIAGNOSIS — M542 Cervicalgia: Secondary | ICD-10-CM | POA: Insufficient documentation

## 2023-12-08 DIAGNOSIS — Z79891 Long term (current) use of opiate analgesic: Secondary | ICD-10-CM

## 2023-12-08 DIAGNOSIS — M47816 Spondylosis without myelopathy or radiculopathy, lumbar region: Secondary | ICD-10-CM | POA: Insufficient documentation

## 2023-12-08 DIAGNOSIS — G894 Chronic pain syndrome: Secondary | ICD-10-CM | POA: Diagnosis not present

## 2023-12-08 DIAGNOSIS — K5090 Crohn's disease, unspecified, without complications: Secondary | ICD-10-CM | POA: Insufficient documentation

## 2023-12-08 DIAGNOSIS — M549 Dorsalgia, unspecified: Secondary | ICD-10-CM | POA: Insufficient documentation

## 2023-12-08 DIAGNOSIS — G8929 Other chronic pain: Secondary | ICD-10-CM | POA: Diagnosis present

## 2023-12-08 MED ORDER — CYCLOBENZAPRINE HCL 10 MG PO TABS: 10.0000 mg | ORAL_TABLET | Freq: Every day | ORAL | 1 refills | Status: AC

## 2023-12-08 MED ORDER — OXYCODONE-ACETAMINOPHEN 10-325 MG PO TABS: 1.0000 | ORAL_TABLET | Freq: Three times a day (TID) | ORAL | 0 refills | Status: AC | PRN

## 2023-12-08 MED ORDER — OXYCODONE-ACETAMINOPHEN 10-325 MG PO TABS: 1.0000 | ORAL_TABLET | Freq: Three times a day (TID) | ORAL | 0 refills | Status: DC | PRN

## 2023-12-08 NOTE — Progress Notes (Unsigned)
 Nursing Pain Medication Assessment:  Safety precautions to be maintained throughout the outpatient stay will include: orient to surroundings, keep bed in low position, maintain call bell within reach at all times, provide assistance with transfer out of bed and ambulation.  Medication Inspection Compliance: Pill count conducted under aseptic conditions, in front of the patient. Neither the pills nor the bottle was removed from the patient's sight at any time. Once count was completed pills were immediately returned to the patient in their original bottle.  Medication: Oxycodone /APAP Pill/Patch Count: 19 of 90 pills/patches remain Pill/Patch Appearance: Markings consistent with prescribed medication Bottle Appearance: Standard pharmacy container. Clearly labeled. Filled Date: 75 / 21 / 2025 Last Medication intake:  Yesterday

## 2023-12-09 ENCOUNTER — Encounter: Payer: Self-pay | Admitting: Anesthesiology

## 2023-12-09 NOTE — Progress Notes (Signed)
 Subjective:  Patient ID: Benjamin Murillo, male    DOB: [birth year]  Age: 53 y.o. MRN: [id number]  CC: Abdominal Pain   Procedure: None  HPI [name] presents for reevaluation.  Pio continues to have chronic issues with ongoing neck pain and trapezius spasming as well as chronic low back pain and chronic abdominal pain followed by GI.  He has intermittent exacerbations of his Crohn's disease for which he takes chronic opioid therapy and Marinol.  He also utilizes the opioid therapy to assist with his back pain and neck pain.  He takes Percocet 10/31/2023 approximately every 8 hours and this does help with alleviating the majority of his neck pain and low back pain as well as covering some of his chronic abdominal pain.  He has been on more conservative therapy and failed this.  He is due to see his GI doctors and follows with them regularly for his Crohn's disease.  In regards to his neck and back pain these have been stable with no changes in the quality characteristic or distribution of the pain.  No change in upper or lower extremity strength or function are noted.  He continues to take chronic opioid therapy to assist with this and his abdominal pain.  He generally gets very good relief with both the neck and back pain components which enable him to stay active and functional.  Without medication he describes decreased activity and decreased capacity for activity with ongoing pain and an inability to sleep at night.  Outpatient Medications Prior to Visit  Medication Sig Dispense Refill   amlodipine-atorvastatin (CADUET) 10-10 MG tablet Take 1 tablet by mouth daily.     citalopram (CELEXA) 10 MG tablet Take 10 mg by mouth daily as needed.     dronabinol (MARINOL) 10 MG capsule Take 10 mg by mouth 2 (two) times daily before a meal.     famotidine  (PEPCID ) 20 MG tablet Take 20 mg by mouth 2 (two) times daily.     gabapentin  (NEURONTIN ) 300 MG capsule Take 1 capsule (300 mg total) by  mouth 3 (three) times daily. 90 capsule 3   hyoscyamine  (LEVSIN , ANASPAZ ) 0.125 MG tablet Take 0.125 mg by mouth as needed.      loperamide (IMODIUM A-D) 2 MG tablet Take 2 mg by mouth as needed.     naloxone  (NARCAN ) nasal spray 4 mg/0.1 mL For respiratory depression from opioids 1 kit 2   oxyCODONE -acetaminophen  (PERCOCET) 10-325 MG tablet Take 1 tablet by mouth every 8 (eight) hours as needed for pain. 90 tablet 0   prochlorperazine (COMPAZINE) 5 MG tablet Take 5 mg by mouth every 8 (eight) hours as needed.     STELARA 90 MG/ML SOSY injection Inject 90 mg as directed.     erythromycin  ophthalmic ointment Place 1 Application into the left eye at bedtime. (Patient not taking: Reported on 12/08/2023) 3.5 g 0   gabapentin  (NEURONTIN ) 100 MG capsule Take 1 capsule (100 mg total) by mouth at bedtime. (Patient not taking: Reported on 12/08/2023) 30 capsule 3   predniSONE  (STERAPRED UNI-PAK 21 TAB) 10 MG (21) TBPK tablet 6 tablets day one and day two, five tablets day three and day four, and daily taper over 12days as directed (Patient not taking: Reported on 12/08/2023) 42 tablet 0   No facility-administered medications prior to visit.    Review of Systems CNS: No confusion or sedation Cardiac: No angina or palpitations GI: No abdominal pain or constipation Constitutional: No nausea vomiting  fevers or chills  Objective:  BP (!) 168/101 (BP Location: Left Arm, Patient Position: Sitting, Cuff Size: Normal)   Pulse 76   Temp 98.1 F (36.7 C) (Temporal)   Resp 18   Ht 5' 11 (1.803 m)   Wt 145 lb (65.8 kg)   SpO2 99%   BMI 20.22 kg/m    BP Readings from Last 3 Encounters:  12/08/23 (!) 168/101  04/22/23 108/74  09/24/22 (!) 161/89     Wt Readings from Last 3 Encounters:  12/08/23 145 lb (65.8 kg)  04/22/23 165 lb (74.8 kg)  09/24/22 159 lb (72.1 kg)     Physical Exam Pt is alert and oriented PERRL EOMI HEART IS RRR no murmur or rub LCTA no wheezing or rales MUSCULOSKELETAL  reveals some paraspinous muscle tenderness in the cervical region as well as a trigger point in the right posterior trapezius.  He has tenderness in the bilateral lumbar paraspinous muscles but no overt trigger points.  He walks with a mildly antalgic gait but muscle tone and bulk appears to be at baseline.  Abdominal exam is deferred.  Labs  No results found for: HGBA1C Lab Results  Component Value Date   CREATININE 0.87 03/18/2018    -------------------------------------------------------------------------------------------------------------------- Lab Results  Component Value Date   WBC 10.1 03/18/2018   HGB 13.5 03/18/2018   HCT 39.6 03/18/2018   PLT 218 03/18/2018   GLUCOSE 65 (L) 03/18/2018   ALT 19 03/18/2018   AST 29 03/18/2018   NA 130 (L) 03/18/2018   K 4.1 03/18/2018   CL 95 (L) 03/18/2018   CREATININE 0.87 03/18/2018   BUN 16 03/18/2018   CO2 20 (L) 03/18/2018   TSH 0.667 03/18/2018    --------------------------------------------------------------------------------------------------------------------- No results found.   Assessment & Plan:   There are no diagnoses linked to this encounter.      ----------------------------------------------------------------------------------------------------------------------  Problem List Items Addressed This Visit   None     ----------------------------------------------------------------------------------------------------------------------  There are no diagnoses linked to this encounter.   ----------------------------------------------------------------------------------------------------------------------  I am having [name]. Arvelo start on oxyCODONE -acetaminophen , oxyCODONE -acetaminophen , and cyclobenzaprine . I am also having him maintain his amlodipine-atorvastatin, dronabinol, naloxone , hyoscyamine , famotidine , Stelara, prochlorperazine, gabapentin , citalopram, loperamide, predniSONE , gabapentin ,  erythromycin , and oxyCODONE -acetaminophen .   Meds ordered this encounter  Medications   oxyCODONE -acetaminophen  (PERCOCET) 10-325 MG tablet    Sig: Take 1 tablet by mouth every 8 (eight) hours as needed for pain.    Dispense:  90 tablet    Refill:  0   oxyCODONE -acetaminophen  (PERCOCET) 10-325 MG tablet    Sig: Take 1 tablet by mouth every 8 (eight) hours as needed for pain.    Dispense:  90 tablet    Refill:  0   cyclobenzaprine  (FLEXERIL ) 10 MG tablet    Sig: Take 1 tablet (10 mg total) by mouth at bedtime.    Dispense:  30 tablet    Refill:  1   Patient's Medications  New Prescriptions   CYCLOBENZAPRINE  (FLEXERIL ) 10 MG TABLET    Take 1 tablet (10 mg total) by mouth at bedtime.   OXYCODONE -ACETAMINOPHEN  (PERCOCET) 10-325 MG TABLET    Take 1 tablet by mouth every 8 (eight) hours as needed for pain.   OXYCODONE -ACETAMINOPHEN  (PERCOCET) 10-325 MG TABLET    Take 1 tablet by mouth every 8 (eight) hours as needed for pain.  Previous Medications   AMLODIPINE-ATORVASTATIN (CADUET) 10-10 MG TABLET    Take 1 tablet by mouth daily.   CITALOPRAM (CELEXA) 10 MG TABLET  Take 10 mg by mouth daily as needed.   DRONABINOL (MARINOL) 10 MG CAPSULE    Take 10 mg by mouth 2 (two) times daily before a meal.   ERYTHROMYCIN  OPHTHALMIC OINTMENT    Place 1 Application into the left eye at bedtime.   FAMOTIDINE  (PEPCID ) 20 MG TABLET    Take 20 mg by mouth 2 (two) times daily.   GABAPENTIN  (NEURONTIN ) 100 MG CAPSULE    Take 1 capsule (100 mg total) by mouth at bedtime.   GABAPENTIN  (NEURONTIN ) 300 MG CAPSULE    Take 1 capsule (300 mg total) by mouth 3 (three) times daily.   HYOSCYAMINE  (LEVSIN , ANASPAZ ) 0.125 MG TABLET    Take 0.125 mg by mouth as needed.    LOPERAMIDE (IMODIUM A-D) 2 MG TABLET    Take 2 mg by mouth as needed.   NALOXONE  (NARCAN ) NASAL SPRAY 4 MG/0.1 ML    For respiratory depression from opioids   OXYCODONE -ACETAMINOPHEN  (PERCOCET) 10-325 MG TABLET    Take 1 tablet by mouth every 8  (eight) hours as needed for pain.   PREDNISONE  (STERAPRED UNI-PAK 21 TAB) 10 MG (21) TBPK TABLET    6 tablets day one and day two, five tablets day three and day four, and daily taper over 12days as directed   PROCHLORPERAZINE (COMPAZINE) 5 MG TABLET    Take 5 mg by mouth every 8 (eight) hours as needed.   STELARA 90 MG/ML SOSY INJECTION    Inject 90 mg as directed.  Modified Medications   No medications on file  Discontinued Medications   No medications on file   ----------------------------------------------------------------------------------------------------------------------  Follow-up: Return in about 2 months (around 02/07/2024) for evaluation, med refill.  1. Cervicalgia   2. Crohn's disease without complication, unspecified gastrointestinal tract location (HCC)   3. Chronic pain syndrome   4. Chronic, continuous use of opioids   5. Facet arthritis of lumbar region   6. Chronic neck and back pain    Based on our conversation today I think it is appropriate to continue with his current opioid medications.  These continue to give him good relief with no side effects reported.  They are also helping control some of the chronic abdominal pain that he is experienced.  This has hospitalized him in the past.  No side effects with the medications are reported and he is due for refill on November 20 and December 20.  I have reviewed the [place]  practitioner database information is appropriate for refill today.  I have encouraged him to follow-up with his GI doctor should his abdominal pain persist or intensify.  We will start him on Flexeril  for the neck spasming and the back spasming.  Will schedule this for 10 mg tablet once at bedtime.  Have encouraged him to continue with stretching strengthening exercises as he is doing at home.  He is to follow-up with his medical doctors for baseline medical care.  Lynwood KANDICE Clause, MD

## 2023-12-24 NOTE — Progress Notes (Signed)
 [facility] Clinical Assessment & Refill Coordination Note    Rodney Bender, [place]: 1970/10/14  Phone: [phone] (home) [phone] (work)    All above HIPAA information was verified with patient.     Was a nurse, learning disability used for this call? No    Specialty Medication(s):   Inflammatory Disorders: Stelara      Current Medications[1]     Changes to medications: Sabastian reports no changes at this time.    Medication list has been reviewed and updated in Epic: Yes    Allergies[2]    Changes to allergies: No    Allergies have been reviewed and updated in Epic: Yes    SPECIALTY MEDICATION ADHERENCE     Stelara  90 mg/ml: 0 doses of medicine on hand   Medication Adherence    Patient reported X missed doses in the last month: 0  Specialty Medication: Stelara  90mg /mL  Patient is on additional specialty medications: No  Informant: patient          Specialty medication(s) dose(s) confirmed: Regimen is correct and unchanged.     Are there any concerns with adherence? No    Adherence counseling provided? Not needed    CLINICAL MANAGEMENT AND INTERVENTION      Clinical Benefit Assessment:    Do you feel the medicine is effective or helping your condition? Yes    Clinical Benefit counseling provided? Not needed    Adverse Effects Assessment:    Are you experiencing any side effects? No    Are you experiencing difficulty administering your medicine? No    Quality of Life Assessment:    Quality of Life    Rheumatology  Oncology  Dermatology  Cystic Fibrosis          How many days over the past month did your Crohn's  keep you from your normal activities? For example, brushing your teeth or getting up in the morning. 0    Have you discussed this with your provider? Not needed    Acute Infection Status:    Acute infections noted within Epic:  No active infections    Patient reported infection: None    Therapy Appropriateness:    Is the medication and dose appropriate considering the patient???s diagnosis, treatment, and disease journey, comorbidities, medical history, current medications, allergies, therapeutic goals, self-administration ability, and access barriers? Yes, therapy is appropriate and should be continued     Clinical Intervention:    Was an intervention completed as part of this clinical assessment? No    DISEASE/MEDICATION-SPECIFIC INFORMATION      For patients on injectable medications: Next injection is scheduled for 12/13.    Chronic Inflammatory Diseases: Have you experienced any flares in the last month? No  Has this been reported to your provider? Not applicable    PATIENT SPECIFIC NEEDS     Does the patient have any physical, cognitive, or cultural barriers? No    Is the patient high risk? No    Does the patient require physician intervention or other additional services (i.e., nutrition, smoking cessation, social work)? No    Does the patient have an additional or emergency contact listed in their chart? Yes    SOCIAL DETERMINANTS OF HEALTH     At the [facility], we have learned that life circumstances - like trouble affording food, housing, utilities, or transportation can affect the health of many of our patients.   That is why we wanted to ask: are you currently experiencing any  life circumstances that are negatively impacting your health and/or quality of life? No    Social Drivers of Health     Food Insecurity: Food Insecurity Present (04/03/2021)    Hunger Vital Sign     Worried About Running Out of Food in the Last Year: Often true     Ran Out of Food in the Last Year: Often true   Tobacco Use: Medium Risk (06/17/2023)    Patient History     Smoking Tobacco Use: Former     Smokeless Tobacco Use: Never     Passive Exposure: Not on file   Transportation Needs: Not on file   Alcohol Use: Not At Risk (05/28/2023)    Alcohol Use     How often do you have a drink containing alcohol?: Never     How many drinks containing alcohol do you have on a typical day when you are drinking?: Not on file     How often do you have 5 or more drinks on one occasion?: Not on file   Housing: High Risk (05/28/2023)    Housing     Within the past 12 months, have you ever stayed: outside, in a car, in a tent, in an overnight shelter, or temporarily in someone else's home (i.e. couch-surfing)?: Yes     Are you worried about losing your housing?: Yes   Physical Activity: Not on file   Utilities: Not on file   Stress: Not on file   Interpersonal Safety: Not At Risk (06/13/2023)    Interpersonal Safety     Unsafe Where You Currently Live: No     Physically Hurt by Anyone: No     Abused by Anyone: No   Substance Use: Not on file (12/03/2022)   Intimate Partner Violence: Not At Risk (05/14/2023)    Humiliation, Afraid, Rape, and Kick questionnaire     Fear of Current or Ex-Partner: No     Emotionally Abused: No     Physically Abused: No     Sexually Abused: No   Social Connections: Not on file   Financial Resource Strain: Not on file   Health Literacy: Not on file   Internet Connectivity: Not on file       Would you be willing to receive help with any of the needs that you have identified today? Not applicable       SHIPPING     Specialty Medication(s) to be Shipped:   Inflammatory Disorders: Stelara     Other medication(s) to be shipped: No additional medications requested for fill at this time    Specialty Medications not needed at this time: N/A     Changes to insurance: No    Cost and Payment: Patient has a $0 copay, payment information is not required.    Delivery Scheduled: Yes, Expected medication delivery date: 12/4.     Medication will be delivered via Same Day Courier to the confirmed prescription address in [facility].    The patient will receive a drug information handout for each medication shipped and additional FDA Medication Guides as required.  Verified that patient has previously received a Conservation Officer, Historic Buildings and a Surveyor, Mining.    The patient or caregiver noted above participated in the development of this care plan and knows that they can request review of or adjustments to the care plan at any time.      All of the patient's questions and concerns have been addressed.    Harlene LABOR  Chaley Castellanos, PharmD   [facility] Specialty Pharmacist       [1]   Current Outpatient Medications   Medication Sig Dispense Refill    busPIRone  (BUSPAR ) 10 MG tablet Take 2 tablets (20 mg total) by mouth Three (3) times a day. 180 tablet 2    citalopram  (CELEXA ) 40 MG tablet Take 1 tablet (40 mg total) by mouth daily. 30 tablet 0    cyclobenzaprine (FLEXERIL) 5 MG tablet       dicyclomine  (BENTYL ) 20 mg tablet Take 1 tablet (20 mg total) by mouth Three (3) times a day as needed. 30 tablet 0    dronabinol  (MARINOL ) 10 MG capsule Take 1 capsule (10 mg total) by mouth Two (2) times a day (30 minutes before a meal). 60 capsule 0    ferrous sulfate  325 (65 FE) MG tablet TAKE 1 TABLET BY MOUTH EVERY OTHER DAY 60 tablet 0    gabapentin  (NEURONTIN ) 300 MG capsule Take 2 capsules (600 mg total) by mouth Three (3) times a day. 180 capsule 11    loperamide  (IMODIUM  A-D) 2 mg tablet Take 1 tablet (2 mg total) by mouth daily as needed.      naloxone (NARCAN) 4 mg nasal spray       OLANZapine  (ZYPREXA ) 2.5 MG tablet Take 1 tablet (2.5 mg total) by mouth nightly. 90 tablet 3    ondansetron  (ZOFRAN -ODT) 4 MG disintegrating tablet Dissolve 2 tablets (8 mg total) in the mouth every eight (8) hours as needed for nausea. 45 tablet 11    oxyCODONE -acetaminophen  (PERCOCET ) 10-325 mg per tablet Take 1 tablet by mouth every eight (8) hours as needed.      pantoprazole  (PROTONIX ) 40 MG tablet TAKE 1 TABLET BY MOUTH TWICE DAILY 30 MINUTES BEFORE MEALS 180 tablet 0    promethazine  (PHENERGAN ) 25 MG tablet Take 2 tablets (50 mg total) by mouth every six (6) hours as needed for nausea. 45 tablet 11    therapeutic multivitamin (THERAGRAN) tablet Take 1 tablet by mouth daily. 90 tablet 3    ustekinumab  (STELARA ) 90 mg/mL Syrg syringe Inject the contents of 1 syringe (90 mg total) under the skin every 8 weeks. 1 mL 5     No current facility-administered medications for this visit.   [2]   Allergies  Allergen Reactions    Tramadol Itching and Anxiety     Other reaction(s): Other (See Comments)    Penicillins      Other reaction(s): Itching-Allergy    Dilaudid  [Hydromorphone ] Anxiety    Morphine  Itching     High doses

## 2024-01-01 MED FILL — STELARA 90 MG/ML SUBCUTANEOUS SYRINGE: SUBCUTANEOUS | 56 days supply | Qty: 1 | Fill #5

## 2024-02-09 ENCOUNTER — Ambulatory Visit: Attending: Anesthesiology | Admitting: Anesthesiology

## 2024-02-09 ENCOUNTER — Encounter: Payer: Self-pay | Admitting: Anesthesiology

## 2024-02-09 VITALS — BP 160/100 | HR 60 | Temp 97.3°F | Resp 16 | Ht 71.0 in | Wt 140.0 lb

## 2024-02-09 DIAGNOSIS — M47816 Spondylosis without myelopathy or radiculopathy, lumbar region: Secondary | ICD-10-CM | POA: Insufficient documentation

## 2024-02-09 DIAGNOSIS — M549 Dorsalgia, unspecified: Secondary | ICD-10-CM | POA: Diagnosis present

## 2024-02-09 DIAGNOSIS — F1190 Opioid use, unspecified, uncomplicated: Secondary | ICD-10-CM | POA: Insufficient documentation

## 2024-02-09 DIAGNOSIS — G894 Chronic pain syndrome: Secondary | ICD-10-CM | POA: Insufficient documentation

## 2024-02-09 DIAGNOSIS — Z79891 Long term (current) use of opiate analgesic: Secondary | ICD-10-CM | POA: Diagnosis not present

## 2024-02-09 DIAGNOSIS — R109 Unspecified abdominal pain: Secondary | ICD-10-CM | POA: Insufficient documentation

## 2024-02-09 DIAGNOSIS — K5090 Crohn's disease, unspecified, without complications: Secondary | ICD-10-CM | POA: Diagnosis present

## 2024-02-09 DIAGNOSIS — G8929 Other chronic pain: Secondary | ICD-10-CM | POA: Insufficient documentation

## 2024-02-09 DIAGNOSIS — M542 Cervicalgia: Secondary | ICD-10-CM | POA: Diagnosis present

## 2024-02-09 MED ORDER — OXYCODONE-ACETAMINOPHEN 10-325 MG PO TABS: 1.0000 | ORAL_TABLET | Freq: Three times a day (TID) | ORAL | 0 refills | Status: AC | PRN

## 2024-02-09 NOTE — Progress Notes (Signed)
 "  Subjective:  Patient ID: Benjamin Murillo, male    DOB: 1970-03-06  Age: 54 y.o. MRN: [id number]  CC: Abdominal Pain and Neck Pain   Procedure: None  HPI Benjamin Murillo presents for reevaluation.  Benjamin Murillo has continued to have problems with his chronic abdominal pain and is continue to follow-up with his GI doctors for this.  His low back and neck pain have been stable.  He takes his opioid medications for this and uses them about every 4-6 hours generally getting 75 to 80% relief whereas he has failed more conservative therapy.  This helps dramatically with his chronic abdominal pain.  He also uses Marinol for this pain.  Otherwise his pain has been well-controlled in regards to his neck and back but the problems with his abdomen have been intermittent and he is following along with GI for management of his Crohn's.  Otherwise he is in his usual state of health.  Outpatient Medications Prior to Visit  Medication Sig Dispense Refill   amlodipine-atorvastatin (CADUET) 10-10 MG tablet Take 1 tablet by mouth daily.     citalopram (CELEXA) 10 MG tablet Take 10 mg by mouth daily as needed.     dronabinol (MARINOL) 10 MG capsule Take 10 mg by mouth 2 (two) times daily before a meal.     erythromycin  ophthalmic ointment Place 1 Application into the left eye at bedtime. 3.5 g 0   famotidine  (PEPCID ) 20 MG tablet Take 20 mg by mouth 2 (two) times daily.     gabapentin  (NEURONTIN ) 100 MG capsule Take 1 capsule (100 mg total) by mouth at bedtime. 30 capsule 3   gabapentin  (NEURONTIN ) 300 MG capsule Take 1 capsule (300 mg total) by mouth 3 (three) times daily. 90 capsule 3   hyoscyamine  (LEVSIN , ANASPAZ ) 0.125 MG tablet Take 0.125 mg by mouth as needed.      loperamide (IMODIUM A-D) 2 MG tablet Take 2 mg by mouth as needed.     naloxone  (NARCAN ) nasal spray 4 mg/0.1 mL For respiratory depression from opioids 1 kit 2   predniSONE  (STERAPRED UNI-PAK 21 TAB) 10 MG (21) TBPK tablet 6 tablets day one and  day two, five tablets day three and day four, and daily taper over 12days as directed 42 tablet 0   prochlorperazine (COMPAZINE) 5 MG tablet Take 5 mg by mouth every 8 (eight) hours as needed.     STELARA 90 MG/ML SOSY injection Inject 90 mg as directed.     oxyCODONE -acetaminophen  (PERCOCET) 10-325 MG tablet Take 1 tablet by mouth every 8 (eight) hours as needed for pain. 90 tablet 0   No facility-administered medications prior to visit.    Review of Systems CNS: No confusion or sedation Cardiac: No angina or palpitations GI: No abdominal pain or constipation Constitutional: No nausea vomiting fevers or chills  Objective:  BP (!) 160/100   Pulse 60   Temp (!) 97.3 F (36.3 C)   Resp 16   Ht 5' 11 (1.803 m)   Wt 140 lb (63.5 kg)   SpO2 100%   BMI 19.53 kg/m    BP Readings from Last 3 Encounters:  02/09/24 (!) 160/100  12/08/23 (!) 168/101  04/22/23 108/74     Wt Readings from Last 3 Encounters:  02/09/24 140 lb (63.5 kg)  12/08/23 145 lb (65.8 kg)  04/22/23 165 lb (74.8 kg)     Physical Exam Pt is alert and oriented PERRL EOMI HEART IS RRR no murmur or rub  LCTA no wheezing or rales MUSCULOSKELETAL reveals some paraspinous muscle tenderness in the neck and low back.  He has good strength of 5/5 proximal and distal on the right side with slight diminished extension at the left elbow for tricep extension.  Right this is 5-/5.  Strength in lower extremities appears to be well-preserved.  Muscle tone and bulk is good.  Labs  No results found for: HGBA1C Lab Results  Component Value Date   CREATININE 0.87 03/18/2018    -------------------------------------------------------------------------------------------------------------------- Lab Results  Component Value Date   WBC 10.1 03/18/2018   HGB 13.5 03/18/2018   HCT 39.6 03/18/2018   PLT 218 03/18/2018   GLUCOSE 65 (L) 03/18/2018   ALT 19 03/18/2018   AST 29 03/18/2018   NA 130 (L) 03/18/2018   K 4.1  03/18/2018   CL 95 (L) 03/18/2018   CREATININE 0.87 03/18/2018   BUN 16 03/18/2018   CO2 20 (L) 03/18/2018   TSH 0.667 03/18/2018    --------------------------------------------------------------------------------------------------------------------- No results found.   Assessment & Plan:   Benjamin Murillo was seen today for abdominal pain and neck pain.  Diagnoses and all orders for this visit:  Chronic pain syndrome -     ToxASSURE Select 13 (MW), Urine  Chronic, continuous use of opioids -     ToxASSURE Select 13 (MW), Urine  Other orders -     oxyCODONE -acetaminophen  (PERCOCET) 10-325 MG tablet; Take 1 tablet by mouth every 8 (eight) hours as needed for pain. -     oxyCODONE -acetaminophen  (PERCOCET) 10-325 MG tablet; Take 1 tablet by mouth every 8 (eight) hours as needed for pain.        ----------------------------------------------------------------------------------------------------------------------  Problem List Items Addressed This Visit       Unprioritized   Chronic pain syndrome - Primary (Chronic)   Relevant Medications   oxyCODONE -acetaminophen  (PERCOCET) 10-325 MG tablet (Start on 02/13/2024)   oxyCODONE -acetaminophen  (PERCOCET) 10-325 MG tablet (Start on 03/14/2024)   Other Relevant Orders   ToxASSURE Select 13 (MW), Urine   Other Visit Diagnoses       Chronic, continuous use of opioids       Relevant Orders   ToxASSURE Select 13 (MW), Urine         ----------------------------------------------------------------------------------------------------------------------  1. Chronic pain syndrome (Primary) I have reviewed the [place]  practitioner database information is appropriate for refill.  Change in his regimen will be initiated.  He feels will be generated for January 16 and February 15. - ToxASSURE Select 13 (MW), Urine  2. Chronic, continuous use of opioids As above - ToxASSURE Select 13 (MW), Urine 1. Chronic pain syndrome   2.  Chronic, continuous use of opioids   3. Cervicalgia   4. Crohn's disease without complication, unspecified gastrointestinal tract location (HCC)   5. Facet arthritis of lumbar region   6. Chronic neck and back pain   7. Chronic abdominal pain   As above and continue core stretching strengthening exercises as he is now doing.  Will schedule him for 99-month return to clinic.  Continue follow-up with GI for management of his Crohn's disease and with his primary care physicians for baseline medical care.    ----------------------------------------------------------------------------------------------------------------------  I am having [name]. Bermea start on oxyCODONE -acetaminophen . I am also having him maintain his amlodipine-atorvastatin, dronabinol, naloxone , hyoscyamine , famotidine , Stelara, prochlorperazine, gabapentin , citalopram, loperamide, predniSONE , gabapentin , erythromycin , and oxyCODONE -acetaminophen .   Meds ordered this encounter  Medications   oxyCODONE -acetaminophen  (PERCOCET) 10-325 MG tablet    Sig: Take 1 tablet by mouth  every 8 (eight) hours as needed for pain.    Dispense:  90 tablet    Refill:  0   oxyCODONE -acetaminophen  (PERCOCET) 10-325 MG tablet    Sig: Take 1 tablet by mouth every 8 (eight) hours as needed for pain.    Dispense:  90 tablet    Refill:  0   Patient's Medications  New Prescriptions   OXYCODONE -ACETAMINOPHEN  (PERCOCET) 10-325 MG TABLET    Take 1 tablet by mouth every 8 (eight) hours as needed for pain.  Previous Medications   AMLODIPINE-ATORVASTATIN (CADUET) 10-10 MG TABLET    Take 1 tablet by mouth daily.   CITALOPRAM (CELEXA) 10 MG TABLET    Take 10 mg by mouth daily as needed.   DRONABINOL (MARINOL) 10 MG CAPSULE    Take 10 mg by mouth 2 (two) times daily before a meal.   ERYTHROMYCIN  OPHTHALMIC OINTMENT    Place 1 Application into the left eye at bedtime.   FAMOTIDINE  (PEPCID ) 20 MG TABLET    Take 20 mg by mouth 2 (two) times daily.    GABAPENTIN  (NEURONTIN ) 100 MG CAPSULE    Take 1 capsule (100 mg total) by mouth at bedtime.   GABAPENTIN  (NEURONTIN ) 300 MG CAPSULE    Take 1 capsule (300 mg total) by mouth 3 (three) times daily.   HYOSCYAMINE  (LEVSIN , ANASPAZ ) 0.125 MG TABLET    Take 0.125 mg by mouth as needed.    LOPERAMIDE (IMODIUM A-D) 2 MG TABLET    Take 2 mg by mouth as needed.   NALOXONE  (NARCAN ) NASAL SPRAY 4 MG/0.1 ML    For respiratory depression from opioids   PREDNISONE  (STERAPRED UNI-PAK 21 TAB) 10 MG (21) TBPK TABLET    6 tablets day one and day two, five tablets day three and day four, and daily taper over 12days as directed   PROCHLORPERAZINE (COMPAZINE) 5 MG TABLET    Take 5 mg by mouth every 8 (eight) hours as needed.   STELARA 90 MG/ML SOSY INJECTION    Inject 90 mg as directed.  Modified Medications   Modified Medication Previous Medication   OXYCODONE -ACETAMINOPHEN  (PERCOCET) 10-325 MG TABLET oxyCODONE -acetaminophen  (PERCOCET) 10-325 MG tablet      Take 1 tablet by mouth every 8 (eight) hours as needed for pain.    Take 1 tablet by mouth every 8 (eight) hours as needed for pain.  Discontinued Medications   No medications on file   ----------------------------------------------------------------------------------------------------------------------  Follow-up: Return in about 2 months (around 04/08/2024) for evaluation, med refill.    Lynwood KANDICE Clause, MD  "

## 2024-02-09 NOTE — Progress Notes (Signed)
 Nursing Pain Medication Assessment:  Safety precautions to be maintained throughout the outpatient stay will include: orient to surroundings, keep bed in low position, maintain call bell within reach at all times, provide assistance with transfer out of bed and ambulation.  Medication Inspection Compliance: Pill count conducted under aseptic conditions, in front of the patient. Neither the pills nor the bottle was removed from the patient's sight at any time. Once count was completed pills were immediately returned to the patient in their original bottle.  Medication: Oxycodone /APAP Pill/Patch Count: 12 of 90 pills/patches remain Pill/Patch Appearance: Markings consistent with prescribed medication Bottle Appearance: Standard pharmacy container. Clearly labeled. Filled Date: 15 / 17 / 2025 Last Medication intake:  Today

## 2024-02-10 DIAGNOSIS — F419 Anxiety disorder, unspecified: Principal | ICD-10-CM

## 2024-02-10 MED ORDER — CITALOPRAM 40 MG TABLET
ORAL_TABLET | Freq: Every day | ORAL | 2 refills | 30.00000 days
Start: 2024-02-10 — End: ?

## 2024-02-10 NOTE — Telephone Encounter (Signed)
 Pharmacy requesting refills.

## 2024-02-11 MED ORDER — CITALOPRAM 40 MG TABLET
ORAL_TABLET | Freq: Every day | ORAL | 1 refills | 30.00000 days | Status: CP
Start: 2024-02-11 — End: ?

## 2024-02-11 NOTE — Telephone Encounter (Signed)
 Signed script for 2 months. Messaged admin to help schedule follow up appointment for patient as he has not been seen in the last 12 months.   [facility]

## 2024-02-12 LAB — TOXASSURE SELECT 13 (MW), URINE

## 2024-02-23 MED ORDER — DRONABINOL 10 MG CAPSULE
ORAL_CAPSULE | Freq: Two times a day (BID) | ORAL | 2 refills | 30.00000 days
Start: 2024-02-23 — End: ?

## 2024-02-23 NOTE — Progress Notes (Signed)
 Pt sent MyChart message to request refills of dronabinol , last filled in April 2025.  Pt with Crohn's last seen 06/13/23 in GI clinic, 06/17/23 for EGD/Colonoscopy, did not make it to a scheduled CT Enterography in June.  Maintained on Stelara  90mg  every 8 weeks that is also due for refills (last CBCd and CMP labs for monitoring from April 2025 and due now per refill protocol). and pantoprazole  40mg  po BID that is also due for refills.  Active rx for olanzipine 2.5mg   nightly.   Note sent separately to Dr. Filbert to ask about refills as these go to different pharmacies.

## 2024-02-25 MED ORDER — DRONABINOL 10 MG CAPSULE
ORAL_CAPSULE | Freq: Two times a day (BID) | ORAL | 2 refills | 30.00000 days | Status: CP
Start: 2024-02-25 — End: ?

## 2024-03-03 DIAGNOSIS — K5000 Crohn's disease of small intestine without complications: Principal | ICD-10-CM

## 2024-03-03 MED ORDER — USTEKINUMAB 90 MG/ML SUBCUTANEOUS SYRINGE
SUBCUTANEOUS | 5 refills | 56.00000 days
Start: 2024-03-03 — End: ?
